# Patient Record
Sex: Female | Born: 1940 | Race: White | Hispanic: No | Marital: Married | Smoking: Former smoker
Health system: Southern US, Community
[De-identification: ages and names within clinical notes are randomized; demographics above are authoritative.]

## PROBLEM LIST (undated history)

## (undated) DIAGNOSIS — I480 Paroxysmal atrial fibrillation: Secondary | ICD-10-CM

## (undated) DIAGNOSIS — R0600 Dyspnea, unspecified: Secondary | ICD-10-CM

## (undated) DIAGNOSIS — I341 Nonrheumatic mitral (valve) prolapse: Secondary | ICD-10-CM

## (undated) DIAGNOSIS — I10 Essential (primary) hypertension: Secondary | ICD-10-CM

## (undated) DIAGNOSIS — C3490 Malignant neoplasm of unspecified part of unspecified bronchus or lung: Secondary | ICD-10-CM

## (undated) DIAGNOSIS — I119 Hypertensive heart disease without heart failure: Secondary | ICD-10-CM

## (undated) DIAGNOSIS — D649 Anemia, unspecified: Secondary | ICD-10-CM

## (undated) DIAGNOSIS — Z9289 Personal history of other medical treatment: Secondary | ICD-10-CM

## (undated) DIAGNOSIS — I5032 Chronic diastolic (congestive) heart failure: Secondary | ICD-10-CM

## (undated) DIAGNOSIS — K219 Gastro-esophageal reflux disease without esophagitis: Secondary | ICD-10-CM

## (undated) DIAGNOSIS — Z9981 Dependence on supplemental oxygen: Secondary | ICD-10-CM

## (undated) DIAGNOSIS — I700 Atherosclerosis of aorta: Secondary | ICD-10-CM

## (undated) DIAGNOSIS — I4819 Other persistent atrial fibrillation: Secondary | ICD-10-CM

## (undated) DIAGNOSIS — C07 Malignant neoplasm of parotid gland: Secondary | ICD-10-CM

## (undated) DIAGNOSIS — I509 Heart failure, unspecified: Secondary | ICD-10-CM

## (undated) DIAGNOSIS — I2510 Atherosclerotic heart disease of native coronary artery without angina pectoris: Secondary | ICD-10-CM

## (undated) DIAGNOSIS — N183 Chronic kidney disease, stage 3 (moderate): Secondary | ICD-10-CM

## (undated) DIAGNOSIS — R011 Cardiac murmur, unspecified: Secondary | ICD-10-CM

## (undated) DIAGNOSIS — J984 Other disorders of lung: Secondary | ICD-10-CM

## (undated) HISTORY — PX: DERMABRASION: SHX610

## (undated) HISTORY — PX: PORTACATH PLACEMENT: SHX2246

## (undated) HISTORY — PX: THORACENTESIS: SHX235

## (undated) HISTORY — PX: PAROTID GLAND TUMOR EXCISION: SHX5221

## (undated) HISTORY — DX: Essential (primary) hypertension: I10

## (undated) HISTORY — PX: CATARACT EXTRACTION W/ INTRAOCULAR LENS  IMPLANT, BILATERAL: SHX1307

## (undated) HISTORY — PX: TONSILLECTOMY: SUR1361

## (undated) HISTORY — DX: Paroxysmal atrial fibrillation: I48.0

---

## 1998-02-05 ENCOUNTER — Ambulatory Visit (HOSPITAL_COMMUNITY): Admission: RE | Admit: 1998-02-05 | Discharge: 1998-02-05 | Payer: Self-pay | Admitting: Obstetrics and Gynecology

## 1998-02-12 ENCOUNTER — Ambulatory Visit (HOSPITAL_COMMUNITY): Admission: RE | Admit: 1998-02-12 | Discharge: 1998-02-12 | Payer: Self-pay | Admitting: Obstetrics and Gynecology

## 1999-01-27 ENCOUNTER — Ambulatory Visit (HOSPITAL_COMMUNITY): Admission: RE | Admit: 1999-01-27 | Discharge: 1999-01-27 | Payer: Self-pay

## 1999-02-02 ENCOUNTER — Other Ambulatory Visit: Admission: RE | Admit: 1999-02-02 | Discharge: 1999-02-02 | Payer: Self-pay | Admitting: Obstetrics & Gynecology

## 1999-02-18 ENCOUNTER — Ambulatory Visit (HOSPITAL_COMMUNITY): Admission: RE | Admit: 1999-02-18 | Discharge: 1999-02-18 | Payer: Self-pay

## 1999-03-03 ENCOUNTER — Ambulatory Visit (HOSPITAL_COMMUNITY): Admission: RE | Admit: 1999-03-03 | Discharge: 1999-03-03 | Payer: Self-pay | Admitting: Obstetrics & Gynecology

## 1999-03-03 ENCOUNTER — Encounter: Payer: Self-pay | Admitting: Obstetrics & Gynecology

## 1999-03-23 ENCOUNTER — Ambulatory Visit (HOSPITAL_COMMUNITY): Admission: RE | Admit: 1999-03-23 | Discharge: 1999-03-23 | Payer: Self-pay | Admitting: Obstetrics & Gynecology

## 2000-02-24 ENCOUNTER — Ambulatory Visit (HOSPITAL_COMMUNITY): Admission: RE | Admit: 2000-02-24 | Discharge: 2000-02-24 | Payer: Self-pay | Admitting: Obstetrics & Gynecology

## 2001-01-22 ENCOUNTER — Other Ambulatory Visit: Admission: RE | Admit: 2001-01-22 | Discharge: 2001-01-22 | Payer: Self-pay | Admitting: Obstetrics & Gynecology

## 2001-03-08 ENCOUNTER — Ambulatory Visit (HOSPITAL_COMMUNITY): Admission: RE | Admit: 2001-03-08 | Discharge: 2001-03-08 | Payer: Self-pay | Admitting: Obstetrics & Gynecology

## 2002-03-21 ENCOUNTER — Ambulatory Visit (HOSPITAL_COMMUNITY): Admission: RE | Admit: 2002-03-21 | Discharge: 2002-03-21 | Payer: Self-pay | Admitting: Obstetrics & Gynecology

## 2003-03-27 ENCOUNTER — Ambulatory Visit (HOSPITAL_COMMUNITY): Admission: RE | Admit: 2003-03-27 | Discharge: 2003-03-27 | Payer: Self-pay | Admitting: Obstetrics & Gynecology

## 2003-03-27 ENCOUNTER — Encounter: Payer: Self-pay | Admitting: Obstetrics & Gynecology

## 2003-03-27 ENCOUNTER — Encounter (INDEPENDENT_AMBULATORY_CARE_PROVIDER_SITE_OTHER): Payer: Self-pay | Admitting: *Deleted

## 2005-07-28 ENCOUNTER — Ambulatory Visit (HOSPITAL_COMMUNITY): Admission: RE | Admit: 2005-07-28 | Discharge: 2005-07-28 | Payer: Self-pay | Admitting: Family Medicine

## 2005-08-08 ENCOUNTER — Other Ambulatory Visit: Admission: RE | Admit: 2005-08-08 | Discharge: 2005-08-08 | Payer: Self-pay | Admitting: Obstetrics & Gynecology

## 2006-07-31 ENCOUNTER — Ambulatory Visit (HOSPITAL_COMMUNITY): Admission: RE | Admit: 2006-07-31 | Discharge: 2006-07-31 | Payer: Self-pay | Admitting: Obstetrics & Gynecology

## 2007-02-05 ENCOUNTER — Encounter: Admission: RE | Admit: 2007-02-05 | Discharge: 2007-02-05 | Payer: Self-pay | Admitting: Family Medicine

## 2007-02-06 ENCOUNTER — Ambulatory Visit: Payer: Self-pay | Admitting: Thoracic Surgery

## 2007-02-18 ENCOUNTER — Ambulatory Visit (HOSPITAL_COMMUNITY): Admission: RE | Admit: 2007-02-18 | Discharge: 2007-02-18 | Payer: Self-pay | Admitting: Thoracic Surgery

## 2007-02-19 ENCOUNTER — Ambulatory Visit (HOSPITAL_COMMUNITY): Admission: RE | Admit: 2007-02-19 | Discharge: 2007-02-19 | Payer: Self-pay | Admitting: Thoracic Surgery

## 2007-02-19 ENCOUNTER — Ambulatory Visit: Payer: Self-pay | Admitting: Thoracic Surgery

## 2007-02-19 ENCOUNTER — Encounter (INDEPENDENT_AMBULATORY_CARE_PROVIDER_SITE_OTHER): Payer: Self-pay | Admitting: *Deleted

## 2007-02-20 ENCOUNTER — Ambulatory Visit: Payer: Self-pay | Admitting: Thoracic Surgery

## 2007-02-20 ENCOUNTER — Ambulatory Visit: Payer: Self-pay | Admitting: Internal Medicine

## 2007-03-04 ENCOUNTER — Ambulatory Visit: Admission: RE | Admit: 2007-03-04 | Discharge: 2007-05-11 | Payer: Self-pay | Admitting: Radiation Oncology

## 2007-03-04 LAB — CBC WITH DIFFERENTIAL/PLATELET
Eosinophils Absolute: 0.3 10*3/uL (ref 0.0–0.5)
HCT: 41.5 % (ref 34.8–46.6)
LYMPH%: 19.2 % (ref 14.0–48.0)
MONO#: 0.8 10*3/uL (ref 0.1–0.9)
NEUT#: 4.8 10*3/uL (ref 1.5–6.5)
NEUT%: 65.5 % (ref 39.6–76.8)
Platelets: 293 10*3/uL (ref 145–400)
WBC: 7.3 10*3/uL (ref 3.9–10.0)

## 2007-03-04 LAB — COMPREHENSIVE METABOLIC PANEL
CO2: 29 mEq/L (ref 19–32)
Creatinine, Ser: 0.83 mg/dL (ref 0.40–1.20)
Glucose, Bld: 88 mg/dL (ref 70–99)
Total Bilirubin: 0.7 mg/dL (ref 0.3–1.2)
Total Protein: 6.8 g/dL (ref 6.0–8.3)

## 2007-03-11 ENCOUNTER — Ambulatory Visit (HOSPITAL_COMMUNITY): Admission: RE | Admit: 2007-03-11 | Discharge: 2007-03-11 | Payer: Self-pay | Admitting: Internal Medicine

## 2007-03-12 LAB — CBC WITH DIFFERENTIAL/PLATELET
Eosinophils Absolute: 0.4 10*3/uL (ref 0.0–0.5)
HCT: 41.6 % (ref 34.8–46.6)
LYMPH%: 19.6 % (ref 14.0–48.0)
MONO#: 0.7 10*3/uL (ref 0.1–0.9)
NEUT#: 4 10*3/uL (ref 1.5–6.5)
Platelets: 263 10*3/uL (ref 145–400)
RBC: 4.74 10*6/uL (ref 3.70–5.32)
WBC: 6.3 10*3/uL (ref 3.9–10.0)
lymph#: 1.2 10*3/uL (ref 0.9–3.3)

## 2007-03-12 LAB — COMPREHENSIVE METABOLIC PANEL
Albumin: 3.8 g/dL (ref 3.5–5.2)
CO2: 25 mEq/L (ref 19–32)
Calcium: 9.4 mg/dL (ref 8.4–10.5)
Chloride: 99 mEq/L (ref 96–112)
Glucose, Bld: 120 mg/dL — ABNORMAL HIGH (ref 70–99)
Potassium: 4.1 mEq/L (ref 3.5–5.3)
Sodium: 132 mEq/L — ABNORMAL LOW (ref 135–145)
Total Bilirubin: 0.7 mg/dL (ref 0.3–1.2)
Total Protein: 6.4 g/dL (ref 6.0–8.3)

## 2007-03-13 ENCOUNTER — Ambulatory Visit: Payer: Self-pay | Admitting: Thoracic Surgery

## 2007-03-19 LAB — COMPREHENSIVE METABOLIC PANEL
BUN: 15 mg/dL (ref 6–23)
CO2: 23 mEq/L (ref 19–32)
Calcium: 9.7 mg/dL (ref 8.4–10.5)
Chloride: 104 mEq/L (ref 96–112)
Creatinine, Ser: 0.75 mg/dL (ref 0.40–1.20)
Glucose, Bld: 89 mg/dL (ref 70–99)

## 2007-03-19 LAB — CBC WITH DIFFERENTIAL/PLATELET
Basophils Absolute: 0 10*3/uL (ref 0.0–0.1)
HCT: 39.1 % (ref 34.8–46.6)
HGB: 13.6 g/dL (ref 11.6–15.9)
MCH: 30.5 pg (ref 26.0–34.0)
MONO#: 0.5 10*3/uL (ref 0.1–0.9)
NEUT%: 66.6 % (ref 39.6–76.8)
WBC: 5.8 10*3/uL (ref 3.9–10.0)
lymph#: 1 10*3/uL (ref 0.9–3.3)

## 2007-03-26 LAB — CBC WITH DIFFERENTIAL/PLATELET
EOS%: 3.2 % (ref 0.0–7.0)
Eosinophils Absolute: 0.1 10*3/uL (ref 0.0–0.5)
HCT: 37.8 % (ref 34.8–46.6)
HGB: 13.5 g/dL (ref 11.6–15.9)
LYMPH%: 12.7 % — ABNORMAL LOW (ref 14.0–48.0)
MONO%: 11.5 % (ref 0.0–13.0)
RBC: 4.35 10*6/uL (ref 3.70–5.32)
RDW: 11.1 % — ABNORMAL LOW (ref 11.3–14.5)
WBC: 4.1 10*3/uL (ref 3.9–10.0)

## 2007-03-26 LAB — COMPREHENSIVE METABOLIC PANEL
AST: 31 U/L (ref 0–37)
Alkaline Phosphatase: 60 U/L (ref 39–117)
Glucose, Bld: 88 mg/dL (ref 70–99)
Sodium: 133 mEq/L — ABNORMAL LOW (ref 135–145)
Total Bilirubin: 0.7 mg/dL (ref 0.3–1.2)
Total Protein: 6.8 g/dL (ref 6.0–8.3)

## 2007-04-01 LAB — COMPREHENSIVE METABOLIC PANEL
ALT: 32 U/L (ref 0–35)
AST: 19 U/L (ref 0–37)
Albumin: 3.9 g/dL (ref 3.5–5.2)
CO2: 25 mEq/L (ref 19–32)
Calcium: 9.4 mg/dL (ref 8.4–10.5)
Chloride: 103 mEq/L (ref 96–112)
Potassium: 4.3 mEq/L (ref 3.5–5.3)
Sodium: 137 mEq/L (ref 135–145)
Total Protein: 6.3 g/dL (ref 6.0–8.3)

## 2007-04-01 LAB — CBC WITH DIFFERENTIAL/PLATELET
Basophils Absolute: 0 10*3/uL (ref 0.0–0.1)
Eosinophils Absolute: 0.1 10*3/uL (ref 0.0–0.5)
HGB: 12.7 g/dL (ref 11.6–15.9)
LYMPH%: 10 % — ABNORMAL LOW (ref 14.0–48.0)
MCH: 31.3 pg (ref 26.0–34.0)
MCV: 86.9 fL (ref 81.0–101.0)
MONO%: 9.4 % (ref 0.0–13.0)
NEUT#: 2.5 10*3/uL (ref 1.5–6.5)
Platelets: 194 10*3/uL (ref 145–400)

## 2007-04-04 ENCOUNTER — Ambulatory Visit: Payer: Self-pay | Admitting: Internal Medicine

## 2007-04-08 LAB — CBC WITH DIFFERENTIAL/PLATELET
BASO%: 0.9 % (ref 0.0–2.0)
EOS%: 1.1 % (ref 0.0–7.0)
HCT: 33.8 % — ABNORMAL LOW (ref 34.8–46.6)
MCH: 31.2 pg (ref 26.0–34.0)
MCHC: 35.9 g/dL (ref 32.0–36.0)
MONO#: 0.3 10*3/uL (ref 0.1–0.9)
NEUT%: 81.3 % — ABNORMAL HIGH (ref 39.6–76.8)
RBC: 3.88 10*6/uL (ref 3.70–5.32)
WBC: 2.9 10*3/uL — ABNORMAL LOW (ref 3.9–10.0)
lymph#: 0.2 10*3/uL — ABNORMAL LOW (ref 0.9–3.3)

## 2007-04-08 LAB — COMPREHENSIVE METABOLIC PANEL
ALT: 49 U/L — ABNORMAL HIGH (ref 0–35)
AST: 31 U/L (ref 0–37)
CO2: 28 mEq/L (ref 19–32)
Calcium: 9.8 mg/dL (ref 8.4–10.5)
Chloride: 104 mEq/L (ref 96–112)
Creatinine, Ser: 0.87 mg/dL (ref 0.40–1.20)
Sodium: 137 mEq/L (ref 135–145)
Total Bilirubin: 1 mg/dL (ref 0.3–1.2)
Total Protein: 6.3 g/dL (ref 6.0–8.3)

## 2007-04-16 LAB — CBC WITH DIFFERENTIAL/PLATELET
BASO%: 1.8 % (ref 0.0–2.0)
EOS%: 2.3 % (ref 0.0–7.0)
HCT: 32.1 % — ABNORMAL LOW (ref 34.8–46.6)
LYMPH%: 14.2 % (ref 14.0–48.0)
MCH: 30.5 pg (ref 26.0–34.0)
MCHC: 35.7 g/dL (ref 32.0–36.0)
NEUT%: 63.7 % (ref 39.6–76.8)
Platelets: 109 10*3/uL — ABNORMAL LOW (ref 145–400)
RBC: 3.76 10*6/uL (ref 3.70–5.32)

## 2007-04-17 ENCOUNTER — Other Ambulatory Visit: Admission: RE | Admit: 2007-04-17 | Discharge: 2007-04-17 | Payer: Self-pay | Admitting: Interventional Radiology

## 2007-04-17 ENCOUNTER — Encounter (INDEPENDENT_AMBULATORY_CARE_PROVIDER_SITE_OTHER): Payer: Self-pay | Admitting: Interventional Radiology

## 2007-04-17 ENCOUNTER — Encounter: Admission: RE | Admit: 2007-04-17 | Discharge: 2007-04-17 | Payer: Self-pay | Admitting: Internal Medicine

## 2007-04-23 LAB — COMPREHENSIVE METABOLIC PANEL
ALT: 25 U/L (ref 0–35)
AST: 20 U/L (ref 0–37)
Albumin: 3.6 g/dL (ref 3.5–5.2)
Calcium: 9.7 mg/dL (ref 8.4–10.5)
Chloride: 103 mEq/L (ref 96–112)
Creatinine, Ser: 0.8 mg/dL (ref 0.40–1.20)
Potassium: 3.7 mEq/L (ref 3.5–5.3)
Sodium: 139 mEq/L (ref 135–145)
Total Protein: 6 g/dL (ref 6.0–8.3)

## 2007-04-23 LAB — CBC WITH DIFFERENTIAL/PLATELET
BASO%: 0.6 % (ref 0.0–2.0)
EOS%: 1.7 % (ref 0.0–7.0)
MCH: UNDETERMINED pg (ref 26.0–34.0)
MCHC: UNDETERMINED g/dL (ref 32.0–36.0)
RBC: UNDETERMINED 10*6/uL (ref 3.70–5.32)
RDW: 14.1 % (ref 11.3–14.5)
WBC: 1.4 10*3/uL — ABNORMAL LOW (ref 3.9–10.0)
lymph#: 0.3 10*3/uL — ABNORMAL LOW (ref 0.9–3.3)

## 2007-04-30 LAB — CBC WITH DIFFERENTIAL/PLATELET
BASO%: 0.1 % (ref 0.0–2.0)
Basophils Absolute: 0 10*3/uL (ref 0.0–0.1)
EOS%: 0.5 % (ref 0.0–7.0)
MCH: 31.6 pg (ref 26.0–34.0)
MCHC: 35.5 g/dL (ref 32.0–36.0)
MCV: 89.1 fL (ref 81.0–101.0)
MONO%: 21 % — ABNORMAL HIGH (ref 0.0–13.0)
RBC: 3.49 10*6/uL — ABNORMAL LOW (ref 3.70–5.32)
RDW: 17.9 % — ABNORMAL HIGH (ref 11.3–14.5)

## 2007-05-14 ENCOUNTER — Ambulatory Visit (HOSPITAL_COMMUNITY): Admission: RE | Admit: 2007-05-14 | Discharge: 2007-05-14 | Payer: Self-pay | Admitting: Internal Medicine

## 2007-05-16 ENCOUNTER — Ambulatory Visit: Payer: Self-pay | Admitting: Internal Medicine

## 2007-05-21 LAB — CBC WITH DIFFERENTIAL/PLATELET
Eosinophils Absolute: 0.3 10*3/uL (ref 0.0–0.5)
MCV: 90 fL (ref 81.0–101.0)
MONO%: 13.4 % — ABNORMAL HIGH (ref 0.0–13.0)
NEUT#: 6.4 10*3/uL (ref 1.5–6.5)
RBC: 3.57 10*6/uL — ABNORMAL LOW (ref 3.70–5.32)
RDW: 18.1 % — ABNORMAL HIGH (ref 11.3–14.5)
WBC: 8.6 10*3/uL (ref 3.9–10.0)

## 2007-05-21 LAB — COMPREHENSIVE METABOLIC PANEL
AST: 18 U/L (ref 0–37)
Albumin: 3.3 g/dL — ABNORMAL LOW (ref 3.5–5.2)
Alkaline Phosphatase: 80 U/L (ref 39–117)
Glucose, Bld: 107 mg/dL — ABNORMAL HIGH (ref 70–99)
Potassium: 3.9 mEq/L (ref 3.5–5.3)
Sodium: 136 mEq/L (ref 135–145)
Total Protein: 6.4 g/dL (ref 6.0–8.3)

## 2007-05-28 LAB — CBC WITH DIFFERENTIAL/PLATELET
EOS%: 0.1 % (ref 0.0–7.0)
MCH: 31.5 pg (ref 26.0–34.0)
MCHC: 35.7 g/dL (ref 32.0–36.0)
MCV: 88.1 fL (ref 81.0–101.0)
MONO%: 6.8 % (ref 0.0–13.0)
NEUT#: 11.2 10*3/uL — ABNORMAL HIGH (ref 1.5–6.5)
RBC: 3.75 10*6/uL (ref 3.70–5.32)
RDW: 13.4 % (ref 11.3–14.5)

## 2007-05-28 LAB — COMPREHENSIVE METABOLIC PANEL
AST: 25 U/L (ref 0–37)
Albumin: 3.7 g/dL (ref 3.5–5.2)
Alkaline Phosphatase: 82 U/L (ref 39–117)
Potassium: 4.6 mEq/L (ref 3.5–5.3)
Sodium: 137 mEq/L (ref 135–145)
Total Protein: 6.9 g/dL (ref 6.0–8.3)

## 2007-06-04 LAB — CBC WITH DIFFERENTIAL/PLATELET
Basophils Absolute: 0 10*3/uL (ref 0.0–0.1)
EOS%: 1.8 % (ref 0.0–7.0)
HGB: 10.8 g/dL — ABNORMAL LOW (ref 11.6–15.9)
LYMPH%: 3 % — ABNORMAL LOW (ref 14.0–48.0)
MCH: 31.5 pg (ref 26.0–34.0)
MCV: 90.5 fL (ref 81.0–101.0)
MONO%: 11.2 % (ref 0.0–13.0)
Platelets: 215 10*3/uL (ref 145–400)
RBC: 3.43 10*6/uL — ABNORMAL LOW (ref 3.70–5.32)
RDW: 17.1 % — ABNORMAL HIGH (ref 11.3–14.5)

## 2007-06-04 LAB — COMPREHENSIVE METABOLIC PANEL
AST: 26 U/L (ref 0–37)
Albumin: 3.3 g/dL — ABNORMAL LOW (ref 3.5–5.2)
Alkaline Phosphatase: 89 U/L (ref 39–117)
BUN: 12 mg/dL (ref 6–23)
Potassium: 3.8 mEq/L (ref 3.5–5.3)
Total Bilirubin: 0.6 mg/dL (ref 0.3–1.2)

## 2007-06-11 LAB — CBC WITH DIFFERENTIAL/PLATELET
Basophils Absolute: 0 10*3/uL (ref 0.0–0.1)
EOS%: 0.4 % (ref 0.0–7.0)
Eosinophils Absolute: 0.1 10*3/uL (ref 0.0–0.5)
HCT: 32.2 % — ABNORMAL LOW (ref 34.8–46.6)
HGB: 11.2 g/dL — ABNORMAL LOW (ref 11.6–15.9)
LYMPH%: 2.7 % — ABNORMAL LOW (ref 14.0–48.0)
MCH: 31.2 pg (ref 26.0–34.0)
MCV: 89.3 fL (ref 81.0–101.0)
MONO%: 5.5 % (ref 0.0–13.0)
NEUT#: 15 10*3/uL — ABNORMAL HIGH (ref 1.5–6.5)
NEUT%: 91.3 % — ABNORMAL HIGH (ref 39.6–76.8)
Platelets: 278 10*3/uL (ref 145–400)
RDW: 17.1 % — ABNORMAL HIGH (ref 11.3–14.5)

## 2007-06-11 LAB — COMPREHENSIVE METABOLIC PANEL
AST: 16 U/L (ref 0–37)
Albumin: 3.5 g/dL (ref 3.5–5.2)
Alkaline Phosphatase: 99 U/L (ref 39–117)
BUN: 15 mg/dL (ref 6–23)
Creatinine, Ser: 0.94 mg/dL (ref 0.40–1.20)
Glucose, Bld: 103 mg/dL — ABNORMAL HIGH (ref 70–99)

## 2007-06-18 LAB — COMPREHENSIVE METABOLIC PANEL
AST: 18 U/L (ref 0–37)
Albumin: 3.6 g/dL (ref 3.5–5.2)
BUN: 16 mg/dL (ref 6–23)
Calcium: 9.7 mg/dL (ref 8.4–10.5)
Chloride: 102 mEq/L (ref 96–112)
Creatinine, Ser: 0.82 mg/dL (ref 0.40–1.20)
Glucose, Bld: 164 mg/dL — ABNORMAL HIGH (ref 70–99)
Potassium: 3.9 mEq/L (ref 3.5–5.3)

## 2007-06-18 LAB — CBC WITH DIFFERENTIAL/PLATELET
Basophils Absolute: 0 10*3/uL (ref 0.0–0.1)
EOS%: 0.1 % (ref 0.0–7.0)
Eosinophils Absolute: 0 10*3/uL (ref 0.0–0.5)
HCT: 33 % — ABNORMAL LOW (ref 34.8–46.6)
HGB: 11.3 g/dL — ABNORMAL LOW (ref 11.6–15.9)
MCH: 30.6 pg (ref 26.0–34.0)
MCV: 89.6 fL (ref 81.0–101.0)
MONO%: 5 % (ref 0.0–13.0)
NEUT#: 10.8 10*3/uL — ABNORMAL HIGH (ref 1.5–6.5)
NEUT%: 90.4 % — ABNORMAL HIGH (ref 39.6–76.8)
RDW: 14.3 % (ref 11.3–14.5)
lymph#: 0.5 10*3/uL — ABNORMAL LOW (ref 0.9–3.3)

## 2007-06-25 LAB — CBC WITH DIFFERENTIAL/PLATELET
Basophils Absolute: 0 10*3/uL (ref 0.0–0.1)
Eosinophils Absolute: 0.1 10*3/uL (ref 0.0–0.5)
HCT: 30.5 % — ABNORMAL LOW (ref 34.8–46.6)
HGB: 10.5 g/dL — ABNORMAL LOW (ref 11.6–15.9)
MONO#: 2.5 10*3/uL — ABNORMAL HIGH (ref 0.1–0.9)
NEUT#: 10.9 10*3/uL — ABNORMAL HIGH (ref 1.5–6.5)
NEUT%: 76.9 % — ABNORMAL HIGH (ref 39.6–76.8)
RDW: 16.8 % — ABNORMAL HIGH (ref 11.3–14.5)
lymph#: 0.7 10*3/uL — ABNORMAL LOW (ref 0.9–3.3)

## 2007-06-25 LAB — COMPREHENSIVE METABOLIC PANEL
AST: 16 U/L (ref 0–37)
Albumin: 3.3 g/dL — ABNORMAL LOW (ref 3.5–5.2)
BUN: 14 mg/dL (ref 6–23)
CO2: 27 mEq/L (ref 19–32)
Calcium: 9.6 mg/dL (ref 8.4–10.5)
Chloride: 99 mEq/L (ref 96–112)
Creatinine, Ser: 0.81 mg/dL (ref 0.40–1.20)
Glucose, Bld: 107 mg/dL — ABNORMAL HIGH (ref 70–99)
Potassium: 3.9 mEq/L (ref 3.5–5.3)

## 2007-06-28 ENCOUNTER — Ambulatory Visit: Payer: Self-pay | Admitting: Internal Medicine

## 2007-07-02 LAB — CBC WITH DIFFERENTIAL/PLATELET
BASO%: 0.9 % (ref 0.0–2.0)
Basophils Absolute: 0.1 10*3/uL (ref 0.0–0.1)
EOS%: 0.3 % (ref 0.0–7.0)
HGB: 11.2 g/dL — ABNORMAL LOW (ref 11.6–15.9)
MCH: 31.3 pg (ref 26.0–34.0)
MONO#: 0.7 10*3/uL (ref 0.1–0.9)
NEUT#: 8.7 10*3/uL — ABNORMAL HIGH (ref 1.5–6.5)
RDW: 17.4 % — ABNORMAL HIGH (ref 11.3–14.5)
WBC: 10.1 10*3/uL — ABNORMAL HIGH (ref 3.9–10.0)
lymph#: 0.5 10*3/uL — ABNORMAL LOW (ref 0.9–3.3)

## 2007-07-02 LAB — COMPREHENSIVE METABOLIC PANEL
ALT: 27 U/L (ref 0–35)
AST: 25 U/L (ref 0–37)
Albumin: 3.5 g/dL (ref 3.5–5.2)
BUN: 12 mg/dL (ref 6–23)
CO2: 25 mEq/L (ref 19–32)
Calcium: 9.5 mg/dL (ref 8.4–10.5)
Chloride: 103 mEq/L (ref 96–112)
Potassium: 4.2 mEq/L (ref 3.5–5.3)

## 2007-07-09 LAB — CBC WITH DIFFERENTIAL/PLATELET
BASO%: 0.2 % (ref 0.0–2.0)
EOS%: 0 % (ref 0.0–7.0)
MCH: 31.2 pg (ref 26.0–34.0)
MCHC: 34.4 g/dL (ref 32.0–36.0)
RDW: 15.1 % — ABNORMAL HIGH (ref 11.3–14.5)
lymph#: 0.4 10*3/uL — ABNORMAL LOW (ref 0.9–3.3)

## 2007-07-09 LAB — COMPREHENSIVE METABOLIC PANEL
ALT: 27 U/L (ref 0–35)
AST: 25 U/L (ref 0–37)
Albumin: 3.9 g/dL (ref 3.5–5.2)
Calcium: 10.1 mg/dL (ref 8.4–10.5)
Chloride: 104 mEq/L (ref 96–112)
Potassium: 4.7 mEq/L (ref 3.5–5.3)
Sodium: 137 mEq/L (ref 135–145)

## 2007-07-16 LAB — CBC WITH DIFFERENTIAL/PLATELET
BASO%: 0.1 % (ref 0.0–2.0)
EOS%: 0.1 % (ref 0.0–7.0)
HCT: 32.4 % — ABNORMAL LOW (ref 34.8–46.6)
MCH: 31.7 pg (ref 26.0–34.0)
MCHC: 35.2 g/dL (ref 32.0–36.0)
NEUT%: 80.5 % — ABNORMAL HIGH (ref 39.6–76.8)
RDW: 18.3 % — ABNORMAL HIGH (ref 11.3–14.5)
lymph#: 0.5 10*3/uL — ABNORMAL LOW (ref 0.9–3.3)

## 2007-07-16 LAB — COMPREHENSIVE METABOLIC PANEL
ALT: 39 U/L — ABNORMAL HIGH (ref 0–35)
AST: 21 U/L (ref 0–37)
Alkaline Phosphatase: 86 U/L (ref 39–117)
Calcium: 9.9 mg/dL (ref 8.4–10.5)
Chloride: 101 mEq/L (ref 96–112)
Creatinine, Ser: 0.85 mg/dL (ref 0.40–1.20)

## 2007-07-22 ENCOUNTER — Other Ambulatory Visit: Payer: Self-pay | Admitting: Internal Medicine

## 2007-07-23 ENCOUNTER — Ambulatory Visit (HOSPITAL_COMMUNITY): Admission: RE | Admit: 2007-07-23 | Discharge: 2007-07-23 | Payer: Self-pay | Admitting: Internal Medicine

## 2007-07-23 LAB — CBC WITH DIFFERENTIAL/PLATELET
BASO%: 1 % (ref 0.0–2.0)
EOS%: 0.2 % (ref 0.0–7.0)
HCT: 34.8 % (ref 34.8–46.6)
LYMPH%: 4.2 % — ABNORMAL LOW (ref 14.0–48.0)
MCH: 31.5 pg (ref 26.0–34.0)
MCHC: 34.7 g/dL (ref 32.0–36.0)
MCV: 90.7 fL (ref 81.0–101.0)
MONO%: 6.5 % (ref 0.0–13.0)
NEUT%: 88.1 % — ABNORMAL HIGH (ref 39.6–76.8)
Platelets: 266 10*3/uL (ref 145–400)

## 2007-07-23 LAB — COMPREHENSIVE METABOLIC PANEL
ALT: 23 U/L (ref 0–35)
AST: 18 U/L (ref 0–37)
Creatinine, Ser: 0.74 mg/dL (ref 0.40–1.20)
Total Bilirubin: 0.8 mg/dL (ref 0.3–1.2)

## 2007-07-30 LAB — CBC WITH DIFFERENTIAL/PLATELET
BASO%: 0.1 % (ref 0.0–2.0)
Basophils Absolute: 0 10*3/uL (ref 0.0–0.1)
EOS%: 0 % (ref 0.0–7.0)
HCT: 34.5 % — ABNORMAL LOW (ref 34.8–46.6)
HGB: 12.1 g/dL (ref 11.6–15.9)
LYMPH%: 1.7 % — ABNORMAL LOW (ref 14.0–48.0)
MCH: 31.2 pg (ref 26.0–34.0)
MCHC: 35.1 g/dL (ref 32.0–36.0)
MONO#: 0.4 10*3/uL (ref 0.1–0.9)
NEUT%: 95.2 % — ABNORMAL HIGH (ref 39.6–76.8)
Platelets: 297 10*3/uL (ref 145–400)

## 2007-07-30 LAB — COMPREHENSIVE METABOLIC PANEL
ALT: 18 U/L (ref 0–35)
BUN: 20 mg/dL (ref 6–23)
CO2: 20 mEq/L (ref 19–32)
Calcium: 10.3 mg/dL (ref 8.4–10.5)
Creatinine, Ser: 0.69 mg/dL (ref 0.40–1.20)
Total Bilirubin: 0.7 mg/dL (ref 0.3–1.2)

## 2007-08-08 ENCOUNTER — Ambulatory Visit (HOSPITAL_COMMUNITY): Admission: RE | Admit: 2007-08-08 | Discharge: 2007-08-08 | Payer: Self-pay | Admitting: Obstetrics & Gynecology

## 2007-10-23 ENCOUNTER — Ambulatory Visit: Payer: Self-pay | Admitting: Internal Medicine

## 2007-10-29 LAB — CBC WITH DIFFERENTIAL/PLATELET
Basophils Absolute: 0 10*3/uL (ref 0.0–0.1)
Eosinophils Absolute: 0.1 10*3/uL (ref 0.0–0.5)
HCT: 38 % (ref 34.8–46.6)
HGB: 13.1 g/dL (ref 11.6–15.9)
LYMPH%: 15.1 % (ref 14.0–48.0)
MCHC: 34.5 g/dL (ref 32.0–36.0)
MONO#: 0.7 10*3/uL (ref 0.1–0.9)
NEUT%: 69.7 % (ref 39.6–76.8)
Platelets: 258 10*3/uL (ref 145–400)
WBC: 4.9 10*3/uL (ref 3.9–10.0)
lymph#: 0.7 10*3/uL — ABNORMAL LOW (ref 0.9–3.3)

## 2007-10-29 LAB — COMPREHENSIVE METABOLIC PANEL
ALT: 22 U/L (ref 0–35)
BUN: 15 mg/dL (ref 6–23)
CO2: 28 mEq/L (ref 19–32)
Calcium: 10 mg/dL (ref 8.4–10.5)
Chloride: 103 mEq/L (ref 96–112)
Creatinine, Ser: 0.77 mg/dL (ref 0.40–1.20)
Glucose, Bld: 94 mg/dL (ref 70–99)
Total Bilirubin: 0.6 mg/dL (ref 0.3–1.2)

## 2007-10-31 ENCOUNTER — Ambulatory Visit (HOSPITAL_COMMUNITY): Admission: RE | Admit: 2007-10-31 | Discharge: 2007-10-31 | Payer: Self-pay | Admitting: Internal Medicine

## 2007-11-05 ENCOUNTER — Ambulatory Visit (HOSPITAL_COMMUNITY): Admission: RE | Admit: 2007-11-05 | Discharge: 2007-11-05 | Payer: Self-pay | Admitting: Internal Medicine

## 2007-11-05 ENCOUNTER — Encounter (INDEPENDENT_AMBULATORY_CARE_PROVIDER_SITE_OTHER): Payer: Self-pay | Admitting: Interventional Radiology

## 2007-11-19 ENCOUNTER — Ambulatory Visit (HOSPITAL_COMMUNITY): Admission: RE | Admit: 2007-11-19 | Discharge: 2007-11-19 | Payer: Self-pay | Admitting: Internal Medicine

## 2007-11-26 ENCOUNTER — Ambulatory Visit: Payer: Self-pay | Admitting: Thoracic Surgery

## 2007-11-26 LAB — COMPREHENSIVE METABOLIC PANEL
ALT: 23 U/L (ref 0–35)
Albumin: 4.1 g/dL (ref 3.5–5.2)
Alkaline Phosphatase: 92 U/L (ref 39–117)
Potassium: 4.4 mEq/L (ref 3.5–5.3)
Sodium: 138 mEq/L (ref 135–145)
Total Bilirubin: 0.8 mg/dL (ref 0.3–1.2)
Total Protein: 6.9 g/dL (ref 6.0–8.3)

## 2007-11-26 LAB — CBC WITH DIFFERENTIAL/PLATELET
BASO%: 0.2 % (ref 0.0–2.0)
Eosinophils Absolute: 0 10*3/uL (ref 0.0–0.5)
LYMPH%: 5.2 % — ABNORMAL LOW (ref 14.0–48.0)
MCHC: 34.8 g/dL (ref 32.0–36.0)
MCV: 89 fL (ref 81.0–101.0)
MONO#: 1.1 10*3/uL — ABNORMAL HIGH (ref 0.1–0.9)
MONO%: 8.9 % (ref 0.0–13.0)
NEUT#: 10.4 10*3/uL — ABNORMAL HIGH (ref 1.5–6.5)
Platelets: 242 10*3/uL (ref 145–400)
RBC: 4.58 10*6/uL (ref 3.70–5.32)
RDW: 12.9 % (ref 11.3–14.5)
WBC: 12.1 10*3/uL — ABNORMAL HIGH (ref 3.9–10.0)

## 2007-12-02 ENCOUNTER — Encounter: Payer: Self-pay | Admitting: Thoracic Surgery

## 2007-12-02 ENCOUNTER — Ambulatory Visit (HOSPITAL_COMMUNITY): Admission: RE | Admit: 2007-12-02 | Discharge: 2007-12-02 | Payer: Self-pay | Admitting: Thoracic Surgery

## 2007-12-03 ENCOUNTER — Ambulatory Visit: Payer: Self-pay | Admitting: Thoracic Surgery

## 2007-12-10 ENCOUNTER — Ambulatory Visit: Payer: Self-pay | Admitting: Thoracic Surgery

## 2007-12-10 ENCOUNTER — Encounter: Admission: RE | Admit: 2007-12-10 | Discharge: 2007-12-10 | Payer: Self-pay | Admitting: Thoracic Surgery

## 2007-12-13 ENCOUNTER — Ambulatory Visit: Payer: Self-pay | Admitting: Internal Medicine

## 2007-12-17 LAB — COMPREHENSIVE METABOLIC PANEL
AST: 28 U/L (ref 0–37)
Alkaline Phosphatase: 100 U/L (ref 39–117)
Glucose, Bld: 100 mg/dL — ABNORMAL HIGH (ref 70–99)
Sodium: 137 mEq/L (ref 135–145)
Total Bilirubin: 0.4 mg/dL (ref 0.3–1.2)
Total Protein: 6.3 g/dL (ref 6.0–8.3)

## 2007-12-17 LAB — CBC WITH DIFFERENTIAL/PLATELET
BASO%: 0.3 % (ref 0.0–2.0)
EOS%: 0.1 % (ref 0.0–7.0)
LYMPH%: 5.3 % — ABNORMAL LOW (ref 14.0–48.0)
MCH: 31.1 pg (ref 26.0–34.0)
MCHC: 35.2 g/dL (ref 32.0–36.0)
MCV: 88.5 fL (ref 81.0–101.0)
MONO%: 10.4 % (ref 0.0–13.0)
Platelets: 410 10*3/uL — ABNORMAL HIGH (ref 145–400)
RBC: 4.23 10*6/uL (ref 3.70–5.32)
RDW: 12.1 % (ref 11.3–14.5)

## 2007-12-25 ENCOUNTER — Encounter: Admission: RE | Admit: 2007-12-25 | Discharge: 2007-12-25 | Payer: Self-pay | Admitting: Thoracic Surgery

## 2007-12-25 ENCOUNTER — Ambulatory Visit: Payer: Self-pay | Admitting: Thoracic Surgery

## 2007-12-27 ENCOUNTER — Ambulatory Visit (HOSPITAL_COMMUNITY): Admission: RE | Admit: 2007-12-27 | Discharge: 2007-12-27 | Payer: Self-pay | Admitting: Thoracic Surgery

## 2008-01-07 LAB — CBC WITH DIFFERENTIAL/PLATELET
BASO%: 1.8 % (ref 0.0–2.0)
Eosinophils Absolute: 0.2 10*3/uL (ref 0.0–0.5)
HGB: 12.9 g/dL (ref 11.6–15.9)
LYMPH%: 11.2 % — ABNORMAL LOW (ref 14.0–48.0)
MCH: 31.7 pg (ref 26.0–34.0)
MONO#: 1 10*3/uL — ABNORMAL HIGH (ref 0.1–0.9)
NEUT#: 3.7 10*3/uL (ref 1.5–6.5)
NEUT%: 65.6 % (ref 39.6–76.8)
RBC: 4.07 10*6/uL (ref 3.70–5.32)
RDW: 12.9 % (ref 11.3–14.5)
lymph#: 0.6 10*3/uL — ABNORMAL LOW (ref 0.9–3.3)

## 2008-01-07 LAB — COMPREHENSIVE METABOLIC PANEL
CO2: 25 mEq/L (ref 19–32)
Creatinine, Ser: 0.7 mg/dL (ref 0.40–1.20)
Glucose, Bld: 92 mg/dL (ref 70–99)
Total Bilirubin: 0.5 mg/dL (ref 0.3–1.2)

## 2008-01-08 ENCOUNTER — Ambulatory Visit: Payer: Self-pay | Admitting: Thoracic Surgery

## 2008-01-08 ENCOUNTER — Encounter: Admission: RE | Admit: 2008-01-08 | Discharge: 2008-01-08 | Payer: Self-pay | Admitting: Thoracic Surgery

## 2008-01-22 ENCOUNTER — Encounter: Admission: RE | Admit: 2008-01-22 | Discharge: 2008-01-22 | Payer: Self-pay | Admitting: Thoracic Surgery

## 2008-01-22 ENCOUNTER — Ambulatory Visit: Payer: Self-pay | Admitting: Thoracic Surgery

## 2008-01-23 ENCOUNTER — Ambulatory Visit: Payer: Self-pay | Admitting: Internal Medicine

## 2008-01-23 ENCOUNTER — Ambulatory Visit (HOSPITAL_COMMUNITY): Admission: RE | Admit: 2008-01-23 | Discharge: 2008-01-23 | Payer: Self-pay | Admitting: Internal Medicine

## 2008-01-28 LAB — CBC WITH DIFFERENTIAL/PLATELET
Basophils Absolute: 0.1 10*3/uL (ref 0.0–0.1)
Eosinophils Absolute: 0.1 10*3/uL (ref 0.0–0.5)
HCT: 36.4 % (ref 34.8–46.6)
HGB: 13.1 g/dL (ref 11.6–15.9)
LYMPH%: 12.5 % — ABNORMAL LOW (ref 14.0–48.0)
MONO#: 0.9 10*3/uL (ref 0.1–0.9)
NEUT#: 3.4 10*3/uL (ref 1.5–6.5)
NEUT%: 66.7 % (ref 39.6–76.8)
Platelets: 327 10*3/uL (ref 145–400)
WBC: 5.2 10*3/uL (ref 3.9–10.0)

## 2008-01-28 LAB — COMPREHENSIVE METABOLIC PANEL
CO2: 29 mEq/L (ref 19–32)
Calcium: 9.7 mg/dL (ref 8.4–10.5)
Creatinine, Ser: 0.68 mg/dL (ref 0.40–1.20)
Glucose, Bld: 93 mg/dL (ref 70–99)
Total Bilirubin: 0.6 mg/dL (ref 0.3–1.2)

## 2008-01-29 ENCOUNTER — Ambulatory Visit: Payer: Self-pay | Admitting: Thoracic Surgery

## 2008-02-03 ENCOUNTER — Ambulatory Visit (HOSPITAL_COMMUNITY): Admission: RE | Admit: 2008-02-03 | Discharge: 2008-02-03 | Payer: Self-pay | Admitting: Thoracic Surgery

## 2008-02-03 ENCOUNTER — Ambulatory Visit: Payer: Self-pay | Admitting: Thoracic Surgery

## 2008-02-12 ENCOUNTER — Encounter: Admission: RE | Admit: 2008-02-12 | Discharge: 2008-02-12 | Payer: Self-pay | Admitting: Thoracic Surgery

## 2008-02-12 ENCOUNTER — Ambulatory Visit: Payer: Self-pay | Admitting: Thoracic Surgery

## 2008-03-04 ENCOUNTER — Encounter: Admission: RE | Admit: 2008-03-04 | Discharge: 2008-03-04 | Payer: Self-pay | Admitting: Thoracic Surgery

## 2008-03-04 ENCOUNTER — Ambulatory Visit: Payer: Self-pay | Admitting: Thoracic Surgery

## 2008-03-05 ENCOUNTER — Ambulatory Visit: Payer: Self-pay | Admitting: Internal Medicine

## 2008-03-10 LAB — CBC WITH DIFFERENTIAL/PLATELET
BASO%: 0.3 % (ref 0.0–2.0)
HCT: 34.4 % — ABNORMAL LOW (ref 34.8–46.6)
LYMPH%: 4.7 % — ABNORMAL LOW (ref 14.0–48.0)
MCH: 31.3 pg (ref 26.0–34.0)
MCHC: 34.4 g/dL (ref 32.0–36.0)
MONO#: 1.2 10*3/uL — ABNORMAL HIGH (ref 0.1–0.9)
NEUT%: 84.1 % — ABNORMAL HIGH (ref 39.6–76.8)
Platelets: 422 10*3/uL — ABNORMAL HIGH (ref 145–400)
WBC: 11.2 10*3/uL — ABNORMAL HIGH (ref 3.9–10.0)

## 2008-03-10 LAB — COMPREHENSIVE METABOLIC PANEL
ALT: 27 U/L (ref 0–35)
CO2: 23 mEq/L (ref 19–32)
Creatinine, Ser: 0.74 mg/dL (ref 0.40–1.20)
Total Bilirubin: 0.4 mg/dL (ref 0.3–1.2)

## 2008-03-27 ENCOUNTER — Ambulatory Visit (HOSPITAL_COMMUNITY): Admission: RE | Admit: 2008-03-27 | Discharge: 2008-03-27 | Payer: Self-pay | Admitting: Internal Medicine

## 2008-04-15 ENCOUNTER — Ambulatory Visit: Payer: Self-pay | Admitting: Internal Medicine

## 2008-05-12 LAB — COMPREHENSIVE METABOLIC PANEL
ALT: 23 U/L (ref 0–35)
AST: 19 U/L (ref 0–37)
Alkaline Phosphatase: 94 U/L (ref 39–117)
Creatinine, Ser: 0.71 mg/dL (ref 0.40–1.20)
Total Bilirubin: 0.6 mg/dL (ref 0.3–1.2)

## 2008-05-12 LAB — CBC WITH DIFFERENTIAL/PLATELET
Eosinophils Absolute: 0 10*3/uL (ref 0.0–0.5)
HCT: 36.1 % (ref 34.8–46.6)
HGB: 12.2 g/dL (ref 11.6–15.9)
MCH: 30.8 pg (ref 26.0–34.0)
MCHC: 33.8 g/dL (ref 32.0–36.0)
MCV: 91.2 fL (ref 81.0–101.0)
MONO%: 10.4 % (ref 0.0–13.0)
Platelets: 417 10*3/uL — ABNORMAL HIGH (ref 145–400)

## 2008-05-27 ENCOUNTER — Ambulatory Visit: Payer: Self-pay | Admitting: Internal Medicine

## 2008-05-28 ENCOUNTER — Ambulatory Visit (HOSPITAL_COMMUNITY): Admission: RE | Admit: 2008-05-28 | Discharge: 2008-05-28 | Payer: Self-pay | Admitting: Internal Medicine

## 2008-06-01 LAB — CBC WITH DIFFERENTIAL/PLATELET
BASO%: 0.4 % (ref 0.0–2.0)
HCT: 34.6 % — ABNORMAL LOW (ref 34.8–46.6)
LYMPH%: 10.2 % — ABNORMAL LOW (ref 14.0–48.0)
MCH: 31.2 pg (ref 26.0–34.0)
MCHC: 34.6 g/dL (ref 32.0–36.0)
MONO#: 0.9 10*3/uL (ref 0.1–0.9)
NEUT%: 68.7 % (ref 39.6–76.8)
Platelets: 391 10*3/uL (ref 145–400)

## 2008-06-01 LAB — COMPREHENSIVE METABOLIC PANEL
ALT: 24 U/L (ref 0–35)
BUN: 13 mg/dL (ref 6–23)
CO2: 26 mEq/L (ref 19–32)
Creatinine, Ser: 0.87 mg/dL (ref 0.40–1.20)
Total Bilirubin: 0.6 mg/dL (ref 0.3–1.2)

## 2008-06-22 LAB — CBC WITH DIFFERENTIAL/PLATELET
Basophils Absolute: 0 10*3/uL (ref 0.0–0.1)
Eosinophils Absolute: 0 10*3/uL (ref 0.0–0.5)
HCT: 35.5 % (ref 34.8–46.6)
HGB: 12.1 g/dL (ref 11.6–15.9)
LYMPH%: 7.2 % — ABNORMAL LOW (ref 14.0–48.0)
MCHC: 34.1 g/dL (ref 32.0–36.0)
MONO#: 1 10*3/uL — ABNORMAL HIGH (ref 0.1–0.9)
NEUT#: 8.6 10*3/uL — ABNORMAL HIGH (ref 1.5–6.5)
NEUT%: 82.6 % — ABNORMAL HIGH (ref 39.6–76.8)
Platelets: 389 10*3/uL (ref 145–400)
WBC: 10.4 10*3/uL — ABNORMAL HIGH (ref 3.9–10.0)

## 2008-07-12 ENCOUNTER — Ambulatory Visit: Payer: Self-pay | Admitting: Internal Medicine

## 2008-07-14 LAB — COMPREHENSIVE METABOLIC PANEL
ALT: 22 U/L (ref 0–35)
AST: 21 U/L (ref 0–37)
Creatinine, Ser: 0.77 mg/dL (ref 0.40–1.20)
Total Bilirubin: 0.6 mg/dL (ref 0.3–1.2)

## 2008-07-14 LAB — CBC WITH DIFFERENTIAL/PLATELET
BASO%: 0.2 % (ref 0.0–2.0)
EOS%: 0.1 % (ref 0.0–7.0)
HCT: 38.8 % (ref 34.8–46.6)
LYMPH%: 6.4 % — ABNORMAL LOW (ref 14.0–48.0)
MCH: 30.3 pg (ref 26.0–34.0)
MCHC: 33.6 g/dL (ref 32.0–36.0)
NEUT%: 81.7 % — ABNORMAL HIGH (ref 39.6–76.8)
Platelets: 373 10*3/uL (ref 145–400)

## 2008-07-30 ENCOUNTER — Ambulatory Visit (HOSPITAL_COMMUNITY): Admission: RE | Admit: 2008-07-30 | Discharge: 2008-07-30 | Payer: Self-pay | Admitting: Internal Medicine

## 2008-08-04 LAB — COMPREHENSIVE METABOLIC PANEL
ALT: 24 U/L (ref 0–35)
BUN: 17 mg/dL (ref 6–23)
CO2: 22 mEq/L (ref 19–32)
Calcium: 10.5 mg/dL (ref 8.4–10.5)
Chloride: 100 mEq/L (ref 96–112)
Creatinine, Ser: 0.78 mg/dL (ref 0.40–1.20)
Glucose, Bld: 105 mg/dL — ABNORMAL HIGH (ref 70–99)

## 2008-08-04 LAB — CBC WITH DIFFERENTIAL/PLATELET
BASO%: 0.2 % (ref 0.0–2.0)
Basophils Absolute: 0 10*3/uL (ref 0.0–0.1)
Eosinophils Absolute: 0 10*3/uL (ref 0.0–0.5)
HCT: 36.7 % (ref 34.8–46.6)
HGB: 12.7 g/dL (ref 11.6–15.9)
MONO#: 1.1 10*3/uL — ABNORMAL HIGH (ref 0.1–0.9)
NEUT#: 10.4 10*3/uL — ABNORMAL HIGH (ref 1.5–6.5)
NEUT%: 87 % — ABNORMAL HIGH (ref 39.6–76.8)
WBC: 12 10*3/uL — ABNORMAL HIGH (ref 3.9–10.0)
lymph#: 0.5 10*3/uL — ABNORMAL LOW (ref 0.9–3.3)

## 2008-08-10 ENCOUNTER — Ambulatory Visit (HOSPITAL_COMMUNITY): Admission: RE | Admit: 2008-08-10 | Discharge: 2008-08-10 | Payer: Self-pay | Admitting: Family Medicine

## 2008-08-16 ENCOUNTER — Emergency Department (HOSPITAL_COMMUNITY): Admission: EM | Admit: 2008-08-16 | Discharge: 2008-08-16 | Payer: Self-pay | Admitting: Emergency Medicine

## 2008-08-25 LAB — COMPREHENSIVE METABOLIC PANEL
ALT: 17 U/L (ref 0–35)
CO2: 22 mEq/L (ref 19–32)
Sodium: 137 mEq/L (ref 135–145)
Total Bilirubin: 0.6 mg/dL (ref 0.3–1.2)
Total Protein: 7 g/dL (ref 6.0–8.3)

## 2008-08-25 LAB — CBC WITH DIFFERENTIAL/PLATELET
BASO%: 0.1 % (ref 0.0–2.0)
EOS%: 0 % (ref 0.0–7.0)
LYMPH%: 4.7 % — ABNORMAL LOW (ref 14.0–48.0)
MCHC: 34 g/dL (ref 32.0–36.0)
MCV: 89.4 fL (ref 81.0–101.0)
MONO%: 10.5 % (ref 0.0–13.0)
Platelets: 423 10*3/uL — ABNORMAL HIGH (ref 145–400)
RBC: 3.9 10*6/uL (ref 3.70–5.32)
WBC: 10.2 10*3/uL — ABNORMAL HIGH (ref 3.9–10.0)

## 2008-09-11 ENCOUNTER — Ambulatory Visit: Payer: Self-pay | Admitting: Internal Medicine

## 2008-09-15 LAB — CBC WITH DIFFERENTIAL/PLATELET
BASO%: 0.3 % (ref 0.0–2.0)
Basophils Absolute: 0 10*3/uL (ref 0.0–0.1)
EOS%: 0 % (ref 0.0–7.0)
Eosinophils Absolute: 0 10*3/uL (ref 0.0–0.5)
HCT: 36 % (ref 34.8–46.6)
HGB: 12 g/dL (ref 11.6–15.9)
LYMPH%: 5.4 % — ABNORMAL LOW (ref 14.0–48.0)
MCH: 30.4 pg (ref 26.0–34.0)
MCHC: 33.3 g/dL (ref 32.0–36.0)
MCV: 91.3 fL (ref 81.0–101.0)
MONO#: 1.1 10*3/uL — ABNORMAL HIGH (ref 0.1–0.9)
MONO%: 11.1 % (ref 0.0–13.0)
NEUT#: 8.3 10*3/uL — ABNORMAL HIGH (ref 1.5–6.5)
NEUT%: 83.2 % — ABNORMAL HIGH (ref 39.6–76.8)
Platelets: 372 10*3/uL (ref 145–400)
RBC: 3.95 10*6/uL (ref 3.70–5.32)
RDW: 15.4 % — ABNORMAL HIGH (ref 11.3–14.5)
WBC: 10 10*3/uL (ref 3.9–10.0)
lymph#: 0.5 10*3/uL — ABNORMAL LOW (ref 0.9–3.3)

## 2008-09-15 LAB — COMPREHENSIVE METABOLIC PANEL
BUN: 11 mg/dL (ref 6–23)
CO2: 24 mEq/L (ref 19–32)
Calcium: 10 mg/dL (ref 8.4–10.5)
Chloride: 103 mEq/L (ref 96–112)
Creatinine, Ser: 0.82 mg/dL (ref 0.40–1.20)
Total Bilirubin: 0.4 mg/dL (ref 0.3–1.2)

## 2008-09-30 ENCOUNTER — Ambulatory Visit (HOSPITAL_COMMUNITY): Admission: RE | Admit: 2008-09-30 | Discharge: 2008-09-30 | Payer: Self-pay | Admitting: Internal Medicine

## 2008-10-06 LAB — CBC WITH DIFFERENTIAL/PLATELET
Basophils Absolute: 0 10*3/uL (ref 0.0–0.1)
Eosinophils Absolute: 0 10*3/uL (ref 0.0–0.5)
HGB: 12.6 g/dL (ref 11.6–15.9)
MONO#: 1 10*3/uL — ABNORMAL HIGH (ref 0.1–0.9)
NEUT#: 8.5 10*3/uL — ABNORMAL HIGH (ref 1.5–6.5)
RDW: 16.2 % — ABNORMAL HIGH (ref 11.3–14.5)
WBC: 10.2 10*3/uL — ABNORMAL HIGH (ref 3.9–10.0)
lymph#: 0.6 10*3/uL — ABNORMAL LOW (ref 0.9–3.3)

## 2008-10-06 LAB — COMPREHENSIVE METABOLIC PANEL
Albumin: 3.6 g/dL (ref 3.5–5.2)
BUN: 14 mg/dL (ref 6–23)
Calcium: 9.8 mg/dL (ref 8.4–10.5)
Chloride: 103 mEq/L (ref 96–112)
Glucose, Bld: 154 mg/dL — ABNORMAL HIGH (ref 70–99)
Potassium: 4.5 mEq/L (ref 3.5–5.3)
Sodium: 136 mEq/L (ref 135–145)
Total Protein: 6.8 g/dL (ref 6.0–8.3)

## 2008-10-23 ENCOUNTER — Ambulatory Visit: Payer: Self-pay | Admitting: Internal Medicine

## 2008-10-27 LAB — CBC WITH DIFFERENTIAL/PLATELET
Basophils Absolute: 0 10*3/uL (ref 0.0–0.1)
EOS%: 0.1 % (ref 0.0–7.0)
Eosinophils Absolute: 0 10*3/uL (ref 0.0–0.5)
HGB: 12.3 g/dL (ref 11.6–15.9)
NEUT#: 8.5 10*3/uL — ABNORMAL HIGH (ref 1.5–6.5)
RDW: 15.8 % — ABNORMAL HIGH (ref 11.3–14.5)
lymph#: 0.6 10*3/uL — ABNORMAL LOW (ref 0.9–3.3)

## 2008-11-03 ENCOUNTER — Ambulatory Visit (HOSPITAL_COMMUNITY): Admission: RE | Admit: 2008-11-03 | Discharge: 2008-11-03 | Payer: Self-pay | Admitting: Thoracic Surgery

## 2008-11-03 ENCOUNTER — Ambulatory Visit: Payer: Self-pay | Admitting: Thoracic Surgery

## 2008-11-04 ENCOUNTER — Ambulatory Visit: Payer: Self-pay | Admitting: Thoracic Surgery

## 2008-11-17 LAB — CBC WITH DIFFERENTIAL/PLATELET
Basophils Absolute: 0 10*3/uL (ref 0.0–0.1)
Eosinophils Absolute: 0 10*3/uL (ref 0.0–0.5)
HGB: 12.1 g/dL (ref 11.6–15.9)
MCV: 90.9 fL (ref 81.0–101.0)
MONO%: 10.5 % (ref 0.0–13.0)
NEUT#: 8.7 10*3/uL — ABNORMAL HIGH (ref 1.5–6.5)
RDW: 15 % — ABNORMAL HIGH (ref 11.3–14.5)

## 2008-11-17 LAB — COMPREHENSIVE METABOLIC PANEL
Albumin: 3.7 g/dL (ref 3.5–5.2)
Alkaline Phosphatase: 82 U/L (ref 39–117)
BUN: 14 mg/dL (ref 6–23)
CO2: 22 mEq/L (ref 19–32)
Calcium: 9.7 mg/dL (ref 8.4–10.5)
Chloride: 105 mEq/L (ref 96–112)
Glucose, Bld: 117 mg/dL — ABNORMAL HIGH (ref 70–99)
Potassium: 4.5 mEq/L (ref 3.5–5.3)

## 2008-12-04 ENCOUNTER — Ambulatory Visit (HOSPITAL_COMMUNITY): Admission: RE | Admit: 2008-12-04 | Discharge: 2008-12-04 | Payer: Self-pay | Admitting: Internal Medicine

## 2008-12-04 ENCOUNTER — Ambulatory Visit: Payer: Self-pay | Admitting: Internal Medicine

## 2008-12-08 LAB — CBC WITH DIFFERENTIAL/PLATELET
Basophils Absolute: 0 10*3/uL (ref 0.0–0.1)
Eosinophils Absolute: 0 10*3/uL (ref 0.0–0.5)
HGB: 12.4 g/dL (ref 11.6–15.9)
MCV: 95.6 fL (ref 81.0–101.0)
MONO#: 1.1 10*3/uL — ABNORMAL HIGH (ref 0.1–0.9)
MONO%: 14.5 % — ABNORMAL HIGH (ref 0.0–13.0)
NEUT#: 5.9 10*3/uL (ref 1.5–6.5)
RBC: 3.83 10*6/uL (ref 3.70–5.32)
RDW: 15.3 % — ABNORMAL HIGH (ref 11.3–14.5)
WBC: 7.7 10*3/uL (ref 3.9–10.0)
lymph#: 0.7 10*3/uL — ABNORMAL LOW (ref 0.9–3.3)

## 2008-12-08 LAB — COMPREHENSIVE METABOLIC PANEL
Albumin: 3.6 g/dL (ref 3.5–5.2)
BUN: 16 mg/dL (ref 6–23)
CO2: 23 mEq/L (ref 19–32)
Calcium: 9.8 mg/dL (ref 8.4–10.5)
Chloride: 104 mEq/L (ref 96–112)
Glucose, Bld: 84 mg/dL (ref 70–99)
Potassium: 4.4 mEq/L (ref 3.5–5.3)
Sodium: 136 mEq/L (ref 135–145)
Total Protein: 6.2 g/dL (ref 6.0–8.3)

## 2008-12-29 LAB — CBC WITH DIFFERENTIAL/PLATELET
BASO%: 0.2 % (ref 0.0–2.0)
Basophils Absolute: 0 10*3/uL (ref 0.0–0.1)
EOS%: 0.2 % (ref 0.0–7.0)
HCT: 38.2 % (ref 34.8–46.6)
HGB: 13.1 g/dL (ref 11.6–15.9)
LYMPH%: 5.1 % — ABNORMAL LOW (ref 14.0–48.0)
MCH: 32.8 pg (ref 26.0–34.0)
MCHC: 34.4 g/dL (ref 32.0–36.0)
MCV: 95.3 fL (ref 81.0–101.0)
MONO%: 7.3 % (ref 0.0–13.0)
NEUT%: 87.2 % — ABNORMAL HIGH (ref 39.6–76.8)
Platelets: 341 10*3/uL (ref 145–400)
lymph#: 0.5 10*3/uL — ABNORMAL LOW (ref 0.9–3.3)

## 2008-12-29 LAB — COMPREHENSIVE METABOLIC PANEL
ALT: 15 U/L (ref 0–35)
Alkaline Phosphatase: 88 U/L (ref 39–117)
Creatinine, Ser: 0.72 mg/dL (ref 0.40–1.20)
Sodium: 137 mEq/L (ref 135–145)
Total Bilirubin: 0.6 mg/dL (ref 0.3–1.2)
Total Protein: 6.6 g/dL (ref 6.0–8.3)

## 2009-01-19 ENCOUNTER — Ambulatory Visit: Payer: Self-pay | Admitting: Internal Medicine

## 2009-01-19 LAB — COMPREHENSIVE METABOLIC PANEL
Albumin: 3.3 g/dL — ABNORMAL LOW (ref 3.5–5.2)
Alkaline Phosphatase: 83 U/L (ref 39–117)
BUN: 13 mg/dL (ref 6–23)
CO2: 26 mEq/L (ref 19–32)
Glucose, Bld: 115 mg/dL — ABNORMAL HIGH (ref 70–99)
Potassium: 4.4 mEq/L (ref 3.5–5.3)
Total Bilirubin: 0.8 mg/dL (ref 0.3–1.2)
Total Protein: 6.2 g/dL (ref 6.0–8.3)

## 2009-01-19 LAB — CBC WITH DIFFERENTIAL/PLATELET
Basophils Absolute: 0 10*3/uL (ref 0.0–0.1)
Eosinophils Absolute: 0 10*3/uL (ref 0.0–0.5)
HGB: 12.6 g/dL (ref 11.6–15.9)
MCV: 94.5 fL (ref 79.5–101.0)
MONO#: 0.9 10*3/uL (ref 0.1–0.9)
MONO%: 9.4 % (ref 0.0–14.0)
NEUT#: 8 10*3/uL — ABNORMAL HIGH (ref 1.5–6.5)
RBC: 3.85 10*6/uL (ref 3.70–5.45)
RDW: 15.1 % — ABNORMAL HIGH (ref 11.2–14.5)
WBC: 9.3 10*3/uL (ref 3.9–10.3)

## 2009-02-04 ENCOUNTER — Ambulatory Visit (HOSPITAL_COMMUNITY): Admission: RE | Admit: 2009-02-04 | Discharge: 2009-02-04 | Payer: Self-pay | Admitting: Internal Medicine

## 2009-02-08 LAB — COMPREHENSIVE METABOLIC PANEL
ALT: 15 U/L (ref 0–35)
AST: 19 U/L (ref 0–37)
Alkaline Phosphatase: 93 U/L (ref 39–117)
BUN: 12 mg/dL (ref 6–23)
Chloride: 102 mEq/L (ref 96–112)
Creatinine, Ser: 0.85 mg/dL (ref 0.40–1.20)
Total Bilirubin: 0.6 mg/dL (ref 0.3–1.2)

## 2009-02-08 LAB — CBC WITH DIFFERENTIAL/PLATELET
BASO%: 0.3 % (ref 0.0–2.0)
Basophils Absolute: 0 10*3/uL (ref 0.0–0.1)
EOS%: 1.1 % (ref 0.0–7.0)
HCT: 37.1 % (ref 34.8–46.6)
HGB: 12.7 g/dL (ref 11.6–15.9)
MCH: 32.8 pg (ref 25.1–34.0)
MCHC: 34.2 g/dL (ref 31.5–36.0)
MCV: 95.7 fL (ref 79.5–101.0)
MONO%: 17.1 % — ABNORMAL HIGH (ref 0.0–14.0)
NEUT%: 71.3 % (ref 38.4–76.8)
RDW: 15.7 % — ABNORMAL HIGH (ref 11.2–14.5)
lymph#: 0.6 10*3/uL — ABNORMAL LOW (ref 0.9–3.3)

## 2009-02-25 ENCOUNTER — Ambulatory Visit: Payer: Self-pay | Admitting: Internal Medicine

## 2009-03-01 LAB — COMPREHENSIVE METABOLIC PANEL
ALT: 10 U/L (ref 0–35)
AST: 18 U/L (ref 0–37)
Albumin: 3.6 g/dL (ref 3.5–5.2)
Alkaline Phosphatase: 93 U/L (ref 39–117)
BUN: 10 mg/dL (ref 6–23)
Calcium: 9.6 mg/dL (ref 8.4–10.5)
Chloride: 101 mEq/L (ref 96–112)
Potassium: 4.4 mEq/L (ref 3.5–5.3)
Sodium: 137 mEq/L (ref 135–145)
Total Protein: 6.5 g/dL (ref 6.0–8.3)

## 2009-03-01 LAB — CBC WITH DIFFERENTIAL/PLATELET
EOS%: 1.4 % (ref 0.0–7.0)
Eosinophils Absolute: 0.1 10*3/uL (ref 0.0–0.5)
MCH: 32.6 pg (ref 25.1–34.0)
MCV: 96.1 fL (ref 79.5–101.0)
MONO%: 13.3 % (ref 0.0–14.0)
NEUT#: 4.5 10*3/uL (ref 1.5–6.5)
RBC: 3.87 10*6/uL (ref 3.70–5.45)
RDW: 15.7 % — ABNORMAL HIGH (ref 11.2–14.5)

## 2009-03-22 LAB — CBC WITH DIFFERENTIAL/PLATELET
BASO%: 0.3 % (ref 0.0–2.0)
EOS%: 1.1 % (ref 0.0–7.0)
MCH: 33 pg (ref 25.1–34.0)
MCHC: 34.4 g/dL (ref 31.5–36.0)
MCV: 96 fL (ref 79.5–101.0)
MONO%: 14.4 % — ABNORMAL HIGH (ref 0.0–14.0)
RBC: 3.75 10*6/uL (ref 3.70–5.45)
RDW: 15.7 % — ABNORMAL HIGH (ref 11.2–14.5)
lymph#: 0.5 10*3/uL — ABNORMAL LOW (ref 0.9–3.3)

## 2009-03-22 LAB — COMPREHENSIVE METABOLIC PANEL WITH GFR
ALT: 10 U/L (ref 0–35)
AST: 16 U/L (ref 0–37)
Albumin: 3.5 g/dL (ref 3.5–5.2)
Alkaline Phosphatase: 86 U/L (ref 39–117)
BUN: 12 mg/dL (ref 6–23)
CO2: 27 meq/L (ref 19–32)
Calcium: 9.7 mg/dL (ref 8.4–10.5)
Chloride: 99 meq/L (ref 96–112)
Creatinine, Ser: 0.93 mg/dL (ref 0.40–1.20)
Glucose, Bld: 107 mg/dL — ABNORMAL HIGH (ref 70–99)
Potassium: 4.1 meq/L (ref 3.5–5.3)
Sodium: 137 meq/L (ref 135–145)
Total Bilirubin: 0.6 mg/dL (ref 0.3–1.2)
Total Protein: 6.1 g/dL (ref 6.0–8.3)

## 2009-04-06 ENCOUNTER — Ambulatory Visit (HOSPITAL_COMMUNITY): Admission: RE | Admit: 2009-04-06 | Discharge: 2009-04-06 | Payer: Self-pay | Admitting: Internal Medicine

## 2009-04-08 ENCOUNTER — Ambulatory Visit: Payer: Self-pay | Admitting: Internal Medicine

## 2009-04-12 LAB — CBC WITH DIFFERENTIAL/PLATELET
Eosinophils Absolute: 0 10*3/uL (ref 0.0–0.5)
HGB: 12.5 g/dL (ref 11.6–15.9)
MCV: 96.3 fL (ref 79.5–101.0)
MONO#: 0.2 10*3/uL (ref 0.1–0.9)
MONO%: 3.8 % (ref 0.0–14.0)
NEUT#: 3.4 10*3/uL (ref 1.5–6.5)
RBC: 3.84 10*6/uL (ref 3.70–5.45)
RDW: 15.5 % — ABNORMAL HIGH (ref 11.2–14.5)
WBC: 4 10*3/uL (ref 3.9–10.3)
lymph#: 0.4 10*3/uL — ABNORMAL LOW (ref 0.9–3.3)

## 2009-04-12 LAB — COMPREHENSIVE METABOLIC PANEL
Albumin: 3.5 g/dL (ref 3.5–5.2)
Alkaline Phosphatase: 94 U/L (ref 39–117)
Calcium: 10.1 mg/dL (ref 8.4–10.5)
Chloride: 101 mEq/L (ref 96–112)
Glucose, Bld: 119 mg/dL — ABNORMAL HIGH (ref 70–99)
Potassium: 4.4 mEq/L (ref 3.5–5.3)
Sodium: 137 mEq/L (ref 135–145)
Total Protein: 6.7 g/dL (ref 6.0–8.3)

## 2009-05-04 LAB — COMPREHENSIVE METABOLIC PANEL
ALT: 10 U/L (ref 0–35)
AST: 15 U/L (ref 0–37)
CO2: 25 mEq/L (ref 19–32)
Creatinine, Ser: 0.7 mg/dL (ref 0.40–1.20)
Total Bilirubin: 0.4 mg/dL (ref 0.3–1.2)

## 2009-05-04 LAB — CBC WITH DIFFERENTIAL/PLATELET
BASO%: 0 % (ref 0.0–2.0)
LYMPH%: 6.6 % — ABNORMAL LOW (ref 14.0–49.7)
MCH: 31.3 pg (ref 25.1–34.0)
MCHC: 33.3 g/dL (ref 31.5–36.0)
MCV: 93.8 fL (ref 79.5–101.0)
MONO%: 10.8 % (ref 0.0–14.0)
Platelets: 309 10*3/uL (ref 145–400)
RBC: 3.84 10*6/uL (ref 3.70–5.45)
nRBC: 0 % (ref 0–0)

## 2009-05-24 ENCOUNTER — Ambulatory Visit: Payer: Self-pay | Admitting: Internal Medicine

## 2009-05-24 LAB — CBC WITH DIFFERENTIAL/PLATELET
Basophils Absolute: 0 10*3/uL (ref 0.0–0.1)
Eosinophils Absolute: 0 10*3/uL (ref 0.0–0.5)
HGB: 11.9 g/dL (ref 11.6–15.9)
MCV: 96 fL (ref 79.5–101.0)
MONO#: 0.4 10*3/uL (ref 0.1–0.9)
NEUT#: 4.5 10*3/uL (ref 1.5–6.5)
Platelets: 355 10*3/uL (ref 145–400)
RBC: 3.6 10*6/uL — ABNORMAL LOW (ref 3.70–5.45)
RDW: 16.6 % — ABNORMAL HIGH (ref 11.2–14.5)
WBC: 5.4 10*3/uL (ref 3.9–10.3)

## 2009-05-24 LAB — COMPREHENSIVE METABOLIC PANEL
Albumin: 3.4 g/dL — ABNORMAL LOW (ref 3.5–5.2)
BUN: 12 mg/dL (ref 6–23)
CO2: 28 mEq/L (ref 19–32)
Calcium: 9.9 mg/dL (ref 8.4–10.5)
Glucose, Bld: 116 mg/dL — ABNORMAL HIGH (ref 70–99)
Potassium: 4.2 mEq/L (ref 3.5–5.3)
Sodium: 139 mEq/L (ref 135–145)
Total Protein: 6 g/dL (ref 6.0–8.3)

## 2009-06-10 ENCOUNTER — Ambulatory Visit (HOSPITAL_COMMUNITY): Admission: RE | Admit: 2009-06-10 | Discharge: 2009-06-10 | Payer: Self-pay | Admitting: Internal Medicine

## 2009-06-14 LAB — CBC WITH DIFFERENTIAL/PLATELET
BASO%: 0 % (ref 0.0–2.0)
Basophils Absolute: 0 10*3/uL (ref 0.0–0.1)
EOS%: 0 % (ref 0.0–7.0)
Eosinophils Absolute: 0 10*3/uL (ref 0.0–0.5)
HCT: 36.7 % (ref 34.8–46.6)
HGB: 12.6 g/dL (ref 11.6–15.9)
LYMPH%: 6.9 % — ABNORMAL LOW (ref 14.0–49.7)
MCH: 33.2 pg (ref 25.1–34.0)
MCHC: 34.3 g/dL (ref 31.5–36.0)
MCV: 96.7 fL (ref 79.5–101.0)
MONO#: 0.1 10*3/uL (ref 0.1–0.9)
MONO%: 3.2 % (ref 0.0–14.0)
NEUT#: 3.7 10*3/uL (ref 1.5–6.5)
NEUT%: 89.9 % — ABNORMAL HIGH (ref 38.4–76.8)
Platelets: 376 10*3/uL (ref 145–400)
RBC: 3.8 10*6/uL (ref 3.70–5.45)
RDW: 16.3 % — ABNORMAL HIGH (ref 11.2–14.5)
WBC: 4.1 10*3/uL (ref 3.9–10.3)
lymph#: 0.3 10*3/uL — ABNORMAL LOW (ref 0.9–3.3)

## 2009-06-14 LAB — COMPREHENSIVE METABOLIC PANEL WITH GFR
ALT: 18 U/L (ref 0–35)
AST: 29 U/L (ref 0–37)
Albumin: 3.2 g/dL — ABNORMAL LOW (ref 3.5–5.2)
Alkaline Phosphatase: 87 U/L (ref 39–117)
BUN: 11 mg/dL (ref 6–23)
CO2: 28 meq/L (ref 19–32)
Calcium: 9.8 mg/dL (ref 8.4–10.5)
Chloride: 101 meq/L (ref 96–112)
Creatinine, Ser: 0.86 mg/dL (ref 0.40–1.20)
Glucose, Bld: 126 mg/dL — ABNORMAL HIGH (ref 70–99)
Potassium: 4 meq/L (ref 3.5–5.3)
Sodium: 137 meq/L (ref 135–145)
Total Bilirubin: 0.8 mg/dL (ref 0.3–1.2)
Total Protein: 6.6 g/dL (ref 6.0–8.3)

## 2009-06-28 ENCOUNTER — Encounter: Admission: RE | Admit: 2009-06-28 | Discharge: 2009-06-28 | Payer: Self-pay | Admitting: Family Medicine

## 2009-07-01 ENCOUNTER — Ambulatory Visit: Payer: Self-pay | Admitting: Internal Medicine

## 2009-07-05 LAB — COMPREHENSIVE METABOLIC PANEL
Alkaline Phosphatase: 83 U/L (ref 39–117)
BUN: 16 mg/dL (ref 6–23)
CO2: 33 mEq/L — ABNORMAL HIGH (ref 19–32)
Creatinine, Ser: 0.94 mg/dL (ref 0.40–1.20)
Glucose, Bld: 165 mg/dL — ABNORMAL HIGH (ref 70–99)
Total Bilirubin: 0.6 mg/dL (ref 0.3–1.2)

## 2009-07-05 LAB — CBC WITH DIFFERENTIAL/PLATELET
Basophils Absolute: 0 10*3/uL (ref 0.0–0.1)
Eosinophils Absolute: 0 10*3/uL (ref 0.0–0.5)
HCT: 35.2 % (ref 34.8–46.6)
HGB: 12 g/dL (ref 11.6–15.9)
LYMPH%: 9 % — ABNORMAL LOW (ref 14.0–49.7)
MCV: 96.7 fL (ref 79.5–101.0)
MONO%: 9.8 % (ref 0.0–14.0)
NEUT#: 3.8 10*3/uL (ref 1.5–6.5)
NEUT%: 80.2 % — ABNORMAL HIGH (ref 38.4–76.8)
Platelets: 346 10*3/uL (ref 145–400)
RBC: 3.64 10*6/uL — ABNORMAL LOW (ref 3.70–5.45)

## 2009-07-26 LAB — COMPREHENSIVE METABOLIC PANEL
ALT: 12 U/L (ref 0–35)
Albumin: 3.5 g/dL (ref 3.5–5.2)
CO2: 29 mEq/L (ref 19–32)
Chloride: 97 mEq/L (ref 96–112)
Potassium: 3.7 mEq/L (ref 3.5–5.3)
Sodium: 139 mEq/L (ref 135–145)
Total Bilirubin: 0.7 mg/dL (ref 0.3–1.2)
Total Protein: 6.2 g/dL (ref 6.0–8.3)

## 2009-07-26 LAB — CBC WITH DIFFERENTIAL/PLATELET
BASO%: 0.2 % (ref 0.0–2.0)
LYMPH%: 14.8 % (ref 14.0–49.7)
MCHC: 33.2 g/dL (ref 31.5–36.0)
MONO#: 0.8 10*3/uL (ref 0.1–0.9)
NEUT#: 3.1 10*3/uL (ref 1.5–6.5)
RBC: 3.77 10*6/uL (ref 3.70–5.45)
RDW: 16.6 % — ABNORMAL HIGH (ref 11.2–14.5)
WBC: 4.7 10*3/uL (ref 3.9–10.3)
lymph#: 0.7 10*3/uL — ABNORMAL LOW (ref 0.9–3.3)

## 2009-08-12 ENCOUNTER — Ambulatory Visit: Payer: Self-pay | Admitting: Internal Medicine

## 2009-08-12 ENCOUNTER — Ambulatory Visit (HOSPITAL_COMMUNITY): Admission: RE | Admit: 2009-08-12 | Discharge: 2009-08-12 | Payer: Self-pay | Admitting: Internal Medicine

## 2009-08-16 LAB — COMPREHENSIVE METABOLIC PANEL
Alkaline Phosphatase: 79 U/L (ref 39–117)
Creatinine, Ser: 1.15 mg/dL (ref 0.40–1.20)
Glucose, Bld: 153 mg/dL — ABNORMAL HIGH (ref 70–99)
Sodium: 141 mEq/L (ref 135–145)
Total Bilirubin: 0.6 mg/dL (ref 0.3–1.2)
Total Protein: 6.4 g/dL (ref 6.0–8.3)

## 2009-08-16 LAB — CBC WITH DIFFERENTIAL/PLATELET
Eosinophils Absolute: 0 10*3/uL (ref 0.0–0.5)
LYMPH%: 5.8 % — ABNORMAL LOW (ref 14.0–49.7)
MCHC: 34.8 g/dL (ref 31.5–36.0)
MCV: 98.5 fL (ref 79.5–101.0)
MONO%: 5.3 % (ref 0.0–14.0)
NEUT#: 4.4 10*3/uL (ref 1.5–6.5)
Platelets: 379 10*3/uL (ref 145–400)
RBC: 3.46 10*6/uL — ABNORMAL LOW (ref 3.70–5.45)

## 2009-08-26 ENCOUNTER — Ambulatory Visit (HOSPITAL_COMMUNITY): Admission: RE | Admit: 2009-08-26 | Discharge: 2009-08-26 | Payer: Self-pay | Admitting: Family Medicine

## 2009-09-07 LAB — CBC WITH DIFFERENTIAL/PLATELET
Basophils Absolute: 0 10*3/uL (ref 0.0–0.1)
EOS%: 0 % (ref 0.0–7.0)
Eosinophils Absolute: 0 10*3/uL (ref 0.0–0.5)
HGB: 11.4 g/dL — ABNORMAL LOW (ref 11.6–15.9)
NEUT#: 7.7 10*3/uL — ABNORMAL HIGH (ref 1.5–6.5)
RDW: 17.3 % — ABNORMAL HIGH (ref 11.2–14.5)
lymph#: 0.4 10*3/uL — ABNORMAL LOW (ref 0.9–3.3)

## 2009-09-07 LAB — COMPREHENSIVE METABOLIC PANEL
ALT: 15 U/L (ref 0–35)
Alkaline Phosphatase: 74 U/L (ref 39–117)
Sodium: 140 mEq/L (ref 135–145)
Total Bilirubin: 0.6 mg/dL (ref 0.3–1.2)
Total Protein: 5.8 g/dL — ABNORMAL LOW (ref 6.0–8.3)

## 2009-09-23 ENCOUNTER — Ambulatory Visit: Payer: Self-pay | Admitting: Internal Medicine

## 2009-09-27 LAB — CBC WITH DIFFERENTIAL/PLATELET
Basophils Absolute: 0 10*3/uL (ref 0.0–0.1)
Eosinophils Absolute: 0 10*3/uL (ref 0.0–0.5)
HCT: 32 % — ABNORMAL LOW (ref 34.8–46.6)
HGB: 10.8 g/dL — ABNORMAL LOW (ref 11.6–15.9)
LYMPH%: 8.3 % — ABNORMAL LOW (ref 14.0–49.7)
MCV: 103.2 fL — ABNORMAL HIGH (ref 79.5–101.0)
MONO%: 4.9 % (ref 0.0–14.0)
NEUT#: 3.4 10*3/uL (ref 1.5–6.5)
NEUT%: 86.7 % — ABNORMAL HIGH (ref 38.4–76.8)
Platelets: 410 10*3/uL — ABNORMAL HIGH (ref 145–400)
RDW: 18.2 % — ABNORMAL HIGH (ref 11.2–14.5)

## 2009-09-27 LAB — COMPREHENSIVE METABOLIC PANEL
Albumin: 3.6 g/dL (ref 3.5–5.2)
Alkaline Phosphatase: 82 U/L (ref 39–117)
BUN: 16 mg/dL (ref 6–23)
Glucose, Bld: 130 mg/dL — ABNORMAL HIGH (ref 70–99)
Potassium: 4 mEq/L (ref 3.5–5.3)

## 2009-10-14 ENCOUNTER — Ambulatory Visit (HOSPITAL_COMMUNITY): Admission: RE | Admit: 2009-10-14 | Discharge: 2009-10-14 | Payer: Self-pay | Admitting: Oncology

## 2009-10-19 ENCOUNTER — Ambulatory Visit: Payer: Self-pay | Admitting: Internal Medicine

## 2009-10-19 LAB — CBC WITH DIFFERENTIAL/PLATELET
BASO%: 0.1 % (ref 0.0–2.0)
EOS%: 0.1 % (ref 0.0–7.0)
HGB: 9.9 g/dL — ABNORMAL LOW (ref 11.6–15.9)
MCH: 34.4 pg — ABNORMAL HIGH (ref 25.1–34.0)
MCHC: 32.7 g/dL (ref 31.5–36.0)
RBC: 2.88 10*6/uL — ABNORMAL LOW (ref 3.70–5.45)
RDW: 18.4 % — ABNORMAL HIGH (ref 11.2–14.5)
lymph#: 0.5 10*3/uL — ABNORMAL LOW (ref 0.9–3.3)

## 2009-10-19 LAB — COMPREHENSIVE METABOLIC PANEL
ALT: 10 U/L (ref 0–35)
AST: 16 U/L (ref 0–37)
Albumin: 3.5 g/dL (ref 3.5–5.2)
BUN: 18 mg/dL (ref 6–23)
Calcium: 9.3 mg/dL (ref 8.4–10.5)
Chloride: 101 mEq/L (ref 96–112)
Potassium: 3.5 mEq/L (ref 3.5–5.3)

## 2009-11-09 LAB — COMPREHENSIVE METABOLIC PANEL
Alkaline Phosphatase: 73 U/L (ref 39–117)
BUN: 12 mg/dL (ref 6–23)
Creatinine, Ser: 1.24 mg/dL — ABNORMAL HIGH (ref 0.40–1.20)
Glucose, Bld: 134 mg/dL — ABNORMAL HIGH (ref 70–99)
Total Bilirubin: 0.6 mg/dL (ref 0.3–1.2)

## 2009-11-09 LAB — CBC WITH DIFFERENTIAL/PLATELET
Basophils Absolute: 0 10*3/uL (ref 0.0–0.1)
Eosinophils Absolute: 0.1 10*3/uL (ref 0.0–0.5)
HGB: 9.2 g/dL — ABNORMAL LOW (ref 11.6–15.9)
MCV: 106.7 fL — ABNORMAL HIGH (ref 79.5–101.0)
MONO%: 21.6 % — ABNORMAL HIGH (ref 0.0–14.0)
NEUT#: 4.2 10*3/uL (ref 1.5–6.5)
RBC: 2.68 10*6/uL — ABNORMAL LOW (ref 3.70–5.45)
RDW: 18.4 % — ABNORMAL HIGH (ref 11.2–14.5)
WBC: 6.2 10*3/uL (ref 3.9–10.3)
lymph#: 0.6 10*3/uL — ABNORMAL LOW (ref 0.9–3.3)

## 2009-11-27 ENCOUNTER — Ambulatory Visit: Payer: Self-pay | Admitting: Internal Medicine

## 2009-11-30 LAB — COMPREHENSIVE METABOLIC PANEL
ALT: <8 U/L (ref 0–35)
AST: 11 U/L (ref 0–37)
Albumin: 3 g/dL — ABNORMAL LOW (ref 3.5–5.2)
CO2: 24 mEq/L (ref 19–32)
Calcium: 9 mg/dL (ref 8.4–10.5)
Chloride: 101 mEq/L (ref 96–112)
Potassium: 3.9 mEq/L (ref 3.5–5.3)
Sodium: 138 mEq/L (ref 135–145)
Total Protein: 5.4 g/dL — ABNORMAL LOW (ref 6.0–8.3)

## 2009-11-30 LAB — CBC WITH DIFFERENTIAL/PLATELET
BASO%: 0.1 % (ref 0.0–2.0)
EOS%: 0 % (ref 0.0–7.0)
HGB: 8 g/dL — ABNORMAL LOW (ref 11.6–15.9)
MCH: 34.9 pg — ABNORMAL HIGH (ref 25.1–34.0)
MCHC: 32.3 g/dL (ref 31.5–36.0)
MONO#: 1.1 10*3/uL — ABNORMAL HIGH (ref 0.1–0.9)
RDW: 17.9 % — ABNORMAL HIGH (ref 11.2–14.5)
WBC: 8.6 10*3/uL (ref 3.9–10.3)
lymph#: 0.7 10*3/uL — ABNORMAL LOW (ref 0.9–3.3)

## 2009-11-30 LAB — IRON AND TIBC: %SAT: 20 % (ref 20–55)

## 2009-12-04 ENCOUNTER — Ambulatory Visit: Payer: Self-pay | Admitting: Oncology

## 2009-12-04 ENCOUNTER — Inpatient Hospital Stay (HOSPITAL_COMMUNITY): Admission: EM | Admit: 2009-12-04 | Discharge: 2009-12-09 | Payer: Self-pay | Admitting: Emergency Medicine

## 2009-12-21 LAB — CBC WITH DIFFERENTIAL/PLATELET
Basophils Absolute: 0 10*3/uL (ref 0.0–0.1)
Eosinophils Absolute: 0 10*3/uL (ref 0.0–0.5)
HCT: 26.7 % — ABNORMAL LOW (ref 34.8–46.6)
HGB: 9.2 g/dL — ABNORMAL LOW (ref 11.6–15.9)
MCV: 100 fL (ref 79.5–101.0)
MONO%: 6.4 % (ref 0.0–14.0)
NEUT#: 10.1 10*3/uL — ABNORMAL HIGH (ref 1.5–6.5)
NEUT%: 88.3 % — ABNORMAL HIGH (ref 38.4–76.8)
Platelets: 369 10*3/uL (ref 145–400)
RDW: 21.2 % — ABNORMAL HIGH (ref 11.2–14.5)

## 2009-12-28 ENCOUNTER — Ambulatory Visit: Payer: Self-pay | Admitting: Internal Medicine

## 2009-12-28 LAB — CBC WITH DIFFERENTIAL/PLATELET
BASO%: 0.2 % (ref 0.0–2.0)
EOS%: 0 % (ref 0.0–7.0)
HCT: 32.1 % — ABNORMAL LOW (ref 34.8–46.6)
MCH: 32.7 pg (ref 25.1–34.0)
MCHC: 32.4 g/dL (ref 31.5–36.0)
MCV: 100.9 fL (ref 79.5–101.0)
MONO%: 9.2 % (ref 0.0–14.0)
NEUT%: 83.6 % — ABNORMAL HIGH (ref 38.4–76.8)
lymph#: 1 10*3/uL (ref 0.9–3.3)

## 2009-12-28 LAB — COMPREHENSIVE METABOLIC PANEL
AST: 21 U/L (ref 0–37)
Albumin: 3.3 g/dL — ABNORMAL LOW (ref 3.5–5.2)
Alkaline Phosphatase: 78 U/L (ref 39–117)
BUN: 16 mg/dL (ref 6–23)
Creatinine, Ser: 1.19 mg/dL (ref 0.40–1.20)
Glucose, Bld: 106 mg/dL — ABNORMAL HIGH (ref 70–99)
Potassium: 4.3 mEq/L (ref 3.5–5.3)
Total Bilirubin: 0.7 mg/dL (ref 0.3–1.2)

## 2010-01-18 LAB — CBC WITH DIFFERENTIAL/PLATELET
Basophils Absolute: 0 10*3/uL (ref 0.0–0.1)
EOS%: 0 % (ref 0.0–7.0)
Eosinophils Absolute: 0 10*3/uL (ref 0.0–0.5)
HCT: 28.6 % — ABNORMAL LOW (ref 34.8–46.6)
HGB: 9.2 g/dL — ABNORMAL LOW (ref 11.6–15.9)
LYMPH%: 8.8 % — ABNORMAL LOW (ref 14.0–49.7)
MCH: 33 pg (ref 25.1–34.0)
MCV: 102.5 fL — ABNORMAL HIGH (ref 79.5–101.0)
MONO%: 13.6 % (ref 0.0–14.0)
NEUT#: 6.3 10*3/uL (ref 1.5–6.5)
NEUT%: 77.6 % — ABNORMAL HIGH (ref 38.4–76.8)
Platelets: 301 10*3/uL (ref 145–400)

## 2010-01-18 LAB — COMPREHENSIVE METABOLIC PANEL
Alkaline Phosphatase: 76 U/L (ref 39–117)
Glucose, Bld: 127 mg/dL — ABNORMAL HIGH (ref 70–99)
Sodium: 137 mEq/L (ref 135–145)
Total Bilirubin: 0.7 mg/dL (ref 0.3–1.2)
Total Protein: 5.8 g/dL — ABNORMAL LOW (ref 6.0–8.3)

## 2010-02-03 ENCOUNTER — Ambulatory Visit: Payer: Self-pay | Admitting: Internal Medicine

## 2010-02-07 LAB — COMPREHENSIVE METABOLIC PANEL
ALT: 8 U/L (ref 0–35)
AST: 19 U/L (ref 0–37)
Albumin: 3.2 g/dL — ABNORMAL LOW (ref 3.5–5.2)
Alkaline Phosphatase: 77 U/L (ref 39–117)
Calcium: 9.3 mg/dL (ref 8.4–10.5)
Chloride: 101 mEq/L (ref 96–112)
Creatinine, Ser: 1.34 mg/dL — ABNORMAL HIGH (ref 0.40–1.20)
Potassium: 3.9 mEq/L (ref 3.5–5.3)

## 2010-02-07 LAB — CBC WITH DIFFERENTIAL/PLATELET
BASO%: 0.1 % (ref 0.0–2.0)
EOS%: 0.6 % (ref 0.0–7.0)
MCH: 35.4 pg — ABNORMAL HIGH (ref 25.1–34.0)
MCHC: 33.7 g/dL (ref 31.5–36.0)
RDW: 22.4 % — ABNORMAL HIGH (ref 11.2–14.5)
lymph#: 0.4 10*3/uL — ABNORMAL LOW (ref 0.9–3.3)

## 2010-03-01 LAB — COMPREHENSIVE METABOLIC PANEL
ALT: 9 U/L (ref 0–35)
AST: 22 U/L (ref 0–37)
Alkaline Phosphatase: 69 U/L (ref 39–117)
Creatinine, Ser: 1.38 mg/dL — ABNORMAL HIGH (ref 0.40–1.20)
Sodium: 138 mEq/L (ref 135–145)
Total Bilirubin: 0.6 mg/dL (ref 0.3–1.2)

## 2010-03-01 LAB — CBC WITH DIFFERENTIAL/PLATELET
BASO%: 0.2 % (ref 0.0–2.0)
EOS%: 0 % (ref 0.0–7.0)
HCT: 27.3 % — ABNORMAL LOW (ref 34.8–46.6)
LYMPH%: 13.7 % — ABNORMAL LOW (ref 14.0–49.7)
MCH: 33 pg (ref 25.1–34.0)
MCHC: 31.9 g/dL (ref 31.5–36.0)
MCV: 103.4 fL — ABNORMAL HIGH (ref 79.5–101.0)
MONO#: 1 10*3/uL — ABNORMAL HIGH (ref 0.1–0.9)
MONO%: 20.3 % — ABNORMAL HIGH (ref 0.0–14.0)
NEUT%: 65.8 % (ref 38.4–76.8)
Platelets: 313 10*3/uL (ref 145–400)

## 2010-03-15 ENCOUNTER — Ambulatory Visit: Payer: Self-pay | Admitting: Internal Medicine

## 2010-03-15 ENCOUNTER — Encounter (HOSPITAL_COMMUNITY): Admission: RE | Admit: 2010-03-15 | Discharge: 2010-06-13 | Payer: Self-pay | Admitting: Oncology

## 2010-03-15 LAB — CBC WITH DIFFERENTIAL/PLATELET
Basophils Absolute: 0 10*3/uL (ref 0.0–0.1)
EOS%: 0.4 % (ref 0.0–7.0)
HGB: 7.2 g/dL — ABNORMAL LOW (ref 11.6–15.9)
MCH: 32.9 pg (ref 25.1–34.0)
MCV: 99.5 fL (ref 79.5–101.0)
MONO%: 23.2 % — ABNORMAL HIGH (ref 0.0–14.0)
RDW: 15.9 % — ABNORMAL HIGH (ref 11.2–14.5)

## 2010-03-15 LAB — TYPE & CROSSMATCH - CHCC

## 2010-03-17 ENCOUNTER — Ambulatory Visit (HOSPITAL_COMMUNITY): Admission: RE | Admit: 2010-03-17 | Discharge: 2010-03-17 | Payer: Self-pay | Admitting: Internal Medicine

## 2010-03-22 LAB — CBC WITH DIFFERENTIAL/PLATELET
BASO%: 0.5 % (ref 0.0–2.0)
EOS%: 1.4 % (ref 0.0–7.0)
HCT: 32 % — ABNORMAL LOW (ref 34.8–46.6)
LYMPH%: 13.2 % — ABNORMAL LOW (ref 14.0–49.7)
MCH: 31.8 pg (ref 25.1–34.0)
MCHC: 32.2 g/dL (ref 31.5–36.0)
MONO%: 36.3 % — ABNORMAL HIGH (ref 0.0–14.0)
NEUT%: 48.6 % (ref 38.4–76.8)
Platelets: 267 10*3/uL (ref 145–400)
lymph#: 0.6 10*3/uL — ABNORMAL LOW (ref 0.9–3.3)

## 2010-05-24 ENCOUNTER — Ambulatory Visit: Payer: Self-pay | Admitting: Internal Medicine

## 2010-06-17 ENCOUNTER — Ambulatory Visit (HOSPITAL_COMMUNITY): Admission: RE | Admit: 2010-06-17 | Discharge: 2010-06-17 | Payer: Self-pay | Admitting: Internal Medicine

## 2010-06-17 LAB — CBC WITH DIFFERENTIAL/PLATELET
Basophils Absolute: 0 10*3/uL (ref 0.0–0.1)
Eosinophils Absolute: 0.1 10*3/uL (ref 0.0–0.5)
HCT: 34.3 % — ABNORMAL LOW (ref 34.8–46.6)
HGB: 11.9 g/dL (ref 11.6–15.9)
LYMPH%: 13.2 % — ABNORMAL LOW (ref 14.0–49.7)
MCV: 95.1 fL (ref 79.5–101.0)
MONO%: 12 % (ref 0.0–14.0)
NEUT#: 4 10*3/uL (ref 1.5–6.5)
Platelets: 228 10*3/uL (ref 145–400)

## 2010-06-17 LAB — COMPREHENSIVE METABOLIC PANEL
Albumin: 2.9 g/dL — ABNORMAL LOW (ref 3.5–5.2)
Alkaline Phosphatase: 93 U/L (ref 39–117)
BUN: 17 mg/dL (ref 6–23)
Glucose, Bld: 106 mg/dL — ABNORMAL HIGH (ref 70–99)
Potassium: 3.8 mEq/L (ref 3.5–5.3)
Total Bilirubin: 0.6 mg/dL (ref 0.3–1.2)

## 2010-07-01 ENCOUNTER — Ambulatory Visit: Payer: Self-pay | Admitting: Internal Medicine

## 2010-08-12 ENCOUNTER — Ambulatory Visit: Payer: Self-pay | Admitting: Internal Medicine

## 2010-09-14 ENCOUNTER — Ambulatory Visit: Payer: Self-pay | Admitting: Internal Medicine

## 2010-09-15 ENCOUNTER — Ambulatory Visit (HOSPITAL_COMMUNITY): Admission: RE | Admit: 2010-09-15 | Discharge: 2010-09-15 | Payer: Self-pay | Admitting: Internal Medicine

## 2010-09-15 LAB — CBC WITH DIFFERENTIAL/PLATELET
Basophils Absolute: 0 10*3/uL (ref 0.0–0.1)
Eosinophils Absolute: 0.1 10*3/uL (ref 0.0–0.5)
LYMPH%: 13.4 % — ABNORMAL LOW (ref 14.0–49.7)
MCV: 97.5 fL (ref 79.5–101.0)
MONO%: 11.8 % (ref 0.0–14.0)
NEUT#: 3.6 10*3/uL (ref 1.5–6.5)
Platelets: 211 10*3/uL (ref 145–400)
RBC: 3.54 10*6/uL — ABNORMAL LOW (ref 3.70–5.45)

## 2010-09-15 LAB — COMPREHENSIVE METABOLIC PANEL
Alkaline Phosphatase: 100 U/L (ref 39–117)
BUN: 19 mg/dL (ref 6–23)
Glucose, Bld: 95 mg/dL (ref 70–99)
Sodium: 139 mEq/L (ref 135–145)
Total Bilirubin: 0.6 mg/dL (ref 0.3–1.2)

## 2010-11-04 ENCOUNTER — Ambulatory Visit: Payer: Self-pay | Admitting: Internal Medicine

## 2010-12-13 ENCOUNTER — Ambulatory Visit: Payer: Self-pay | Admitting: Internal Medicine

## 2010-12-15 ENCOUNTER — Ambulatory Visit (HOSPITAL_COMMUNITY)
Admission: RE | Admit: 2010-12-15 | Discharge: 2010-12-15 | Payer: Self-pay | Source: Home / Self Care | Attending: Internal Medicine | Admitting: Internal Medicine

## 2010-12-15 LAB — CBC WITH DIFFERENTIAL/PLATELET
BASO%: 0.6 % (ref 0.0–2.0)
Basophils Absolute: 0 10*3/uL (ref 0.0–0.1)
EOS%: 3 % (ref 0.0–7.0)
Eosinophils Absolute: 0.2 10*3/uL (ref 0.0–0.5)
HCT: 35 % (ref 34.8–46.6)
HGB: 12.1 g/dL (ref 11.6–15.9)
LYMPH%: 14.9 % (ref 14.0–49.7)
MCH: 32.7 pg (ref 25.1–34.0)
MCHC: 34.5 g/dL (ref 31.5–36.0)
MCV: 94.8 fL (ref 79.5–101.0)
MONO#: 0.7 10*3/uL (ref 0.1–0.9)
MONO%: 12.4 % (ref 0.0–14.0)
NEUT#: 3.8 10*3/uL (ref 1.5–6.5)
NEUT%: 69.1 % (ref 38.4–76.8)
Platelets: 187 10*3/uL (ref 145–400)
RBC: 3.69 10*6/uL — ABNORMAL LOW (ref 3.70–5.45)
RDW: 13.6 % (ref 11.2–14.5)
WBC: 5.4 10*3/uL (ref 3.9–10.3)
lymph#: 0.8 10*3/uL — ABNORMAL LOW (ref 0.9–3.3)

## 2010-12-15 LAB — CMP (CANCER CENTER ONLY)
ALT(SGPT): 11 U/L (ref 10–47)
AST: 15 U/L (ref 11–38)
Albumin: 2.7 g/dL — ABNORMAL LOW (ref 3.3–5.5)
Alkaline Phosphatase: 96 U/L — ABNORMAL HIGH (ref 26–84)
BUN, Bld: 26 mg/dL — ABNORMAL HIGH (ref 7–22)
CO2: 31 mEq/L (ref 18–33)
Calcium: 9.4 mg/dL (ref 8.0–10.3)
Chloride: 96 mEq/L — ABNORMAL LOW (ref 98–108)
Creat: 1.9 mg/dl — ABNORMAL HIGH (ref 0.6–1.2)
Glucose, Bld: 88 mg/dL (ref 73–118)
Potassium: 4.1 mEq/L (ref 3.3–4.7)
Sodium: 136 mEq/L (ref 128–145)
Total Bilirubin: 0.7 mg/dl (ref 0.20–1.60)
Total Protein: 6.3 g/dL — ABNORMAL LOW (ref 6.4–8.1)

## 2010-12-18 ENCOUNTER — Encounter: Payer: Self-pay | Admitting: Internal Medicine

## 2010-12-19 ENCOUNTER — Encounter: Payer: Self-pay | Admitting: Obstetrics & Gynecology

## 2010-12-20 ENCOUNTER — Other Ambulatory Visit: Payer: Self-pay | Admitting: Internal Medicine

## 2010-12-20 DIAGNOSIS — C3490 Malignant neoplasm of unspecified part of unspecified bronchus or lung: Secondary | ICD-10-CM

## 2011-01-31 ENCOUNTER — Encounter (HOSPITAL_BASED_OUTPATIENT_CLINIC_OR_DEPARTMENT_OTHER): Payer: Medicare Other | Admitting: Internal Medicine

## 2011-01-31 DIAGNOSIS — Z452 Encounter for adjustment and management of vascular access device: Secondary | ICD-10-CM

## 2011-01-31 DIAGNOSIS — C3430 Malignant neoplasm of lower lobe, unspecified bronchus or lung: Secondary | ICD-10-CM

## 2011-02-12 LAB — BASIC METABOLIC PANEL
BUN: 29 mg/dL — ABNORMAL HIGH (ref 6–23)
BUN: 36 mg/dL — ABNORMAL HIGH (ref 6–23)
CO2: 21 mEq/L (ref 19–32)
CO2: 25 mEq/L (ref 19–32)
CO2: 25 mEq/L (ref 19–32)
Calcium: 8.2 mg/dL — ABNORMAL LOW (ref 8.4–10.5)
Calcium: 8.6 mg/dL (ref 8.4–10.5)
Calcium: 8.6 mg/dL (ref 8.4–10.5)
Calcium: 8.9 mg/dL (ref 8.4–10.5)
Chloride: 104 mEq/L (ref 96–112)
Creatinine, Ser: 1.17 mg/dL (ref 0.4–1.2)
Creatinine, Ser: 1.42 mg/dL — ABNORMAL HIGH (ref 0.4–1.2)
Creatinine, Ser: 1.69 mg/dL — ABNORMAL HIGH (ref 0.4–1.2)
GFR calc Af Amer: 44 mL/min — ABNORMAL LOW (ref >60)
GFR calc Af Amer: 55 mL/min — ABNORMAL LOW (ref >60)
GFR calc non Af Amer: 30 mL/min — ABNORMAL LOW (ref >60)
GFR calc non Af Amer: 37 mL/min — ABNORMAL LOW (ref >60)
GFR calc non Af Amer: 46 mL/min — ABNORMAL LOW (ref >60)
GFR calc non Af Amer: 46 mL/min — ABNORMAL LOW (ref >60)
Glucose, Bld: 122 mg/dL — ABNORMAL HIGH (ref 70–99)
Glucose, Bld: 172 mg/dL — ABNORMAL HIGH (ref 70–99)
Glucose, Bld: 94 mg/dL (ref 70–99)
Potassium: 3.1 mEq/L — ABNORMAL LOW (ref 3.5–5.1)
Sodium: 133 mEq/L — ABNORMAL LOW (ref 135–145)
Sodium: 133 mEq/L — ABNORMAL LOW (ref 135–145)
Sodium: 136 mEq/L (ref 135–145)

## 2011-02-12 LAB — DIFFERENTIAL
Basophils Absolute: 0 10*3/uL (ref 0.0–0.1)
Basophils Relative: 0 % (ref 0–1)
Basophils Relative: 0 % (ref 0–1)
Eosinophils Absolute: 0 10*3/uL (ref 0.0–0.7)
Eosinophils Absolute: 0 10*3/uL (ref 0.0–0.7)
Eosinophils Relative: 0 % (ref 0–5)
Eosinophils Relative: 3 % (ref 0–5)
Lymphocytes Relative: 10 % — ABNORMAL LOW (ref 12–46)
Lymphs Abs: 0 10*3/uL — ABNORMAL LOW (ref 0.7–4.0)
Lymphs Abs: 0 10*3/uL — ABNORMAL LOW (ref 0.7–4.0)
Monocytes Absolute: 0 10*3/uL — ABNORMAL LOW (ref 0.1–1.0)
Monocytes Absolute: 0 10*3/uL — ABNORMAL LOW (ref 0.1–1.0)
Monocytes Absolute: 0 10*3/uL — ABNORMAL LOW (ref 0.1–1.0)
Monocytes Absolute: 0 10*3/uL — ABNORMAL LOW (ref 0.1–1.0)
Monocytes Relative: 2 % — ABNORMAL LOW (ref 3–12)
Neutro Abs: 3.3 10*3/uL (ref 1.7–7.7)
Neutro Abs: 4.3 10*3/uL (ref 1.7–7.7)
Neutrophils Relative %: 0 % — ABNORMAL LOW (ref 43–77)
Neutrophils Relative %: 85 % — ABNORMAL HIGH (ref 43–77)
Neutrophils Relative %: 99 % — ABNORMAL HIGH (ref 43–77)

## 2011-02-12 LAB — URINE MICROSCOPIC-ADD ON

## 2011-02-12 LAB — CBC
HCT: 22.6 % — ABNORMAL LOW (ref 36.0–46.0)
HCT: 23.2 % — ABNORMAL LOW (ref 36.0–46.0)
HCT: 24.7 % — ABNORMAL LOW (ref 36.0–46.0)
HCT: 28 % — ABNORMAL LOW (ref 36.0–46.0)
Hemoglobin: 7.8 g/dL — ABNORMAL LOW (ref 12.0–15.0)
Hemoglobin: 7.8 g/dL — ABNORMAL LOW (ref 12.0–15.0)
Hemoglobin: 8.5 g/dL — ABNORMAL LOW (ref 12.0–15.0)
Hemoglobin: 9.5 g/dL — ABNORMAL LOW (ref 12.0–15.0)
MCHC: 33.7 g/dL (ref 30.0–36.0)
MCHC: 34.4 g/dL (ref 30.0–36.0)
MCHC: 34.4 g/dL (ref 30.0–36.0)
MCV: 103.7 fL — ABNORMAL HIGH (ref 78.0–100.0)
MCV: 106.2 fL — ABNORMAL HIGH (ref 78.0–100.0)
MCV: 98.6 fL (ref 78.0–100.0)
MCV: 98.6 fL (ref 78.0–100.0)
Platelets: 79 10*3/uL — ABNORMAL LOW (ref 150–400)
RBC: 2.18 MIL/uL — ABNORMAL LOW (ref 3.87–5.11)
RBC: 2.56 MIL/uL — ABNORMAL LOW (ref 3.87–5.11)
RBC: 3.06 MIL/uL — ABNORMAL LOW (ref 3.87–5.11)
RDW: 18.5 % — ABNORMAL HIGH (ref 11.5–15.5)
RDW: 19.6 % — ABNORMAL HIGH (ref 11.5–15.5)
RDW: 20.2 % — ABNORMAL HIGH (ref 11.5–15.5)
RDW: 20.6 % — ABNORMAL HIGH (ref 11.5–15.5)
WBC: 1.2 10*3/uL — CL (ref 4.0–10.5)
WBC: 4.5 10*3/uL (ref 4.0–10.5)

## 2011-02-12 LAB — CULTURE, BLOOD (ROUTINE X 2): Culture: NO GROWTH

## 2011-02-12 LAB — URINALYSIS, ROUTINE W REFLEX MICROSCOPIC
Bilirubin Urine: NEGATIVE
Glucose, UA: NEGATIVE mg/dL
Glucose, UA: NEGATIVE mg/dL
Ketones, ur: NEGATIVE mg/dL
Ketones, ur: NEGATIVE mg/dL
Nitrite: NEGATIVE
Protein, ur: NEGATIVE mg/dL
Specific Gravity, Urine: 1.015 (ref 1.005–1.030)
pH: 5.5 (ref 5.0–8.0)
pH: 6 (ref 5.0–8.0)

## 2011-02-12 LAB — COMPREHENSIVE METABOLIC PANEL
Alkaline Phosphatase: 51 U/L (ref 39–117)
BUN: 22 mg/dL (ref 6–23)
Chloride: 106 mEq/L (ref 96–112)
Glucose, Bld: 86 mg/dL (ref 70–99)
Potassium: 3.5 mEq/L (ref 3.5–5.1)
Total Bilirubin: 1 mg/dL (ref 0.3–1.2)
Total Protein: 5.3 g/dL — ABNORMAL LOW (ref 6.0–8.3)

## 2011-02-12 LAB — HEMOGLOBIN A1C
Hgb A1c MFr Bld: 4.9 % (ref 4.6–6.1)
Mean Plasma Glucose: 94 mg/dL

## 2011-02-12 LAB — PREPARE PLATELETS

## 2011-02-12 LAB — BLOOD GAS, ARTERIAL
Bicarbonate: 24.2 mEq/L — ABNORMAL HIGH (ref 20.0–24.0)
Drawn by: 307971
O2 Content: 2 L/min
O2 Saturation: 97.2 %
Patient temperature: 98.6
pH, Arterial: 7.463 — ABNORMAL HIGH (ref 7.350–7.400)

## 2011-02-12 LAB — CROSSMATCH: Antibody Screen: NEGATIVE

## 2011-02-12 LAB — HEMOGLOBIN AND HEMATOCRIT, BLOOD: Hemoglobin: 9.4 g/dL — ABNORMAL LOW (ref 12.0–15.0)

## 2011-02-12 LAB — GLUCOSE, CAPILLARY
Glucose-Capillary: 108 mg/dL — ABNORMAL HIGH (ref 70–99)
Glucose-Capillary: 109 mg/dL — ABNORMAL HIGH (ref 70–99)
Glucose-Capillary: 121 mg/dL — ABNORMAL HIGH (ref 70–99)
Glucose-Capillary: 127 mg/dL — ABNORMAL HIGH (ref 70–99)
Glucose-Capillary: 127 mg/dL — ABNORMAL HIGH (ref 70–99)
Glucose-Capillary: 159 mg/dL — ABNORMAL HIGH (ref 70–99)
Glucose-Capillary: 81 mg/dL (ref 70–99)
Glucose-Capillary: 90 mg/dL (ref 70–99)
Glucose-Capillary: 94 mg/dL (ref 70–99)

## 2011-02-12 LAB — CARDIAC PANEL(CRET KIN+CKTOT+MB+TROPI)
CK, MB: 1.6 ng/mL (ref 0.3–4.0)
Relative Index: 0.3 (ref 0.0–2.5)
Total CK: 882 U/L — ABNORMAL HIGH (ref 7–177)
Troponin I: 0.05 ng/mL (ref 0.00–0.06)

## 2011-02-12 LAB — URINE CULTURE: Colony Count: >=100000

## 2011-02-12 LAB — PREPARE RBC (CROSSMATCH)

## 2011-02-14 LAB — CROSSMATCH

## 2011-03-14 ENCOUNTER — Encounter: Payer: Medicare Other | Admitting: Internal Medicine

## 2011-03-16 ENCOUNTER — Ambulatory Visit (HOSPITAL_COMMUNITY)
Admission: RE | Admit: 2011-03-16 | Discharge: 2011-03-16 | Disposition: A | Discharge status: Home / Self Care | Payer: Medicare Other | Source: Ambulatory Visit | Attending: Internal Medicine | Admitting: Internal Medicine

## 2011-03-16 ENCOUNTER — Other Ambulatory Visit: Payer: Self-pay | Admitting: Internal Medicine

## 2011-03-16 ENCOUNTER — Encounter (HOSPITAL_BASED_OUTPATIENT_CLINIC_OR_DEPARTMENT_OTHER): Payer: Medicare Other | Admitting: Internal Medicine

## 2011-03-16 ENCOUNTER — Other Ambulatory Visit (HOSPITAL_COMMUNITY): Payer: Self-pay

## 2011-03-16 ENCOUNTER — Encounter (HOSPITAL_COMMUNITY): Payer: Self-pay

## 2011-03-16 DIAGNOSIS — R0602 Shortness of breath: Secondary | ICD-10-CM | POA: Insufficient documentation

## 2011-03-16 DIAGNOSIS — J984 Other disorders of lung: Secondary | ICD-10-CM | POA: Insufficient documentation

## 2011-03-16 DIAGNOSIS — C3490 Malignant neoplasm of unspecified part of unspecified bronchus or lung: Secondary | ICD-10-CM

## 2011-03-16 DIAGNOSIS — Z452 Encounter for adjustment and management of vascular access device: Secondary | ICD-10-CM

## 2011-03-16 DIAGNOSIS — J90 Pleural effusion, not elsewhere classified: Secondary | ICD-10-CM | POA: Insufficient documentation

## 2011-03-16 DIAGNOSIS — C3430 Malignant neoplasm of lower lobe, unspecified bronchus or lung: Secondary | ICD-10-CM

## 2011-03-16 DIAGNOSIS — I700 Atherosclerosis of aorta: Secondary | ICD-10-CM | POA: Insufficient documentation

## 2011-03-16 LAB — CMP (CANCER CENTER ONLY)
Albumin: 2.6 g/dL — ABNORMAL LOW (ref 3.3–5.5)
Alkaline Phosphatase: 92 U/L — ABNORMAL HIGH (ref 26–84)
BUN, Bld: 18 mg/dL (ref 7–22)
CO2: 27 mEq/L (ref 18–33)
Glucose, Bld: 88 mg/dL (ref 73–118)
Potassium: 4.4 mEq/L (ref 3.3–4.7)
Total Bilirubin: 0.6 mg/dl (ref 0.20–1.60)
Total Protein: 6.2 g/dL — ABNORMAL LOW (ref 6.4–8.1)

## 2011-03-16 LAB — CBC WITH DIFFERENTIAL/PLATELET
Basophils Absolute: 0 10*3/uL (ref 0.0–0.1)
Eosinophils Absolute: 0.1 10*3/uL (ref 0.0–0.5)
HGB: 11.3 g/dL — ABNORMAL LOW (ref 11.6–15.9)
LYMPH%: 13.1 % — ABNORMAL LOW (ref 14.0–49.7)
MCV: 93.4 fL (ref 79.5–101.0)
MONO#: 0.6 10*3/uL (ref 0.1–0.9)
MONO%: 11.9 % (ref 0.0–14.0)
NEUT#: 3.3 10*3/uL (ref 1.5–6.5)
Platelets: 172 10*3/uL (ref 145–400)
WBC: 4.7 10*3/uL (ref 3.9–10.3)

## 2011-03-21 ENCOUNTER — Other Ambulatory Visit: Payer: Self-pay | Admitting: Internal Medicine

## 2011-03-21 ENCOUNTER — Encounter (HOSPITAL_BASED_OUTPATIENT_CLINIC_OR_DEPARTMENT_OTHER): Payer: Medicare Other | Admitting: Internal Medicine

## 2011-03-21 DIAGNOSIS — C3490 Malignant neoplasm of unspecified part of unspecified bronchus or lung: Secondary | ICD-10-CM

## 2011-03-21 DIAGNOSIS — C3430 Malignant neoplasm of lower lobe, unspecified bronchus or lung: Secondary | ICD-10-CM

## 2011-04-11 NOTE — Letter (Signed)
March 04, 2008   Diane [name], [name], [name]   Re:  [name], [name]               DOB:  05-17-1941   Dear [name]:   [name] came today, and her chest x-ray showed no evidence of change  in her loculated right pleural effusion.  She is having no symptoms,  assessed for 94%.  Her blood pressure is 143/78, pulse 91, respirations  18.  Lungs were clear, except for decreased breath sounds in the right  base.  I will see her back again, if she has any future problems.   Nicanor Alcon, M.D.  Electronically Signed   DPB/MEDQ  D:  03/04/2008  T:  03/04/2008  Job:  [id number]

## 2011-04-11 NOTE — Assessment & Plan Note (Signed)
OFFICE VISIT   Diane [name], GEETER  DOB:  01-11-1941                                        November 04, 2008  CHART #:  [id number]   She came today and her Port-A-Cath site looks good.  It is in good  position.  On chest x-ray, it looks like it is in good position to  distal SVC.  There is no swelling or no hematoma.  Her blood pressure  was 138/83, pulse 98, respirations 18, and sats were 96%.   Nicanor Alcon, M.D.  Electronically Signed   DPB/MEDQ  D:  11/04/2008  T:  11/04/2008  Job:  [id number]

## 2011-04-11 NOTE — Op Note (Signed)
[name], Diane [name]               ACCOUNT NO.:  [id number]   MEDICAL RECORD NO.:  [id number]          PATIENT TYPE:  AMB   LOCATION:  SDS                          FACILITY:  [facility]   PHYSICIAN:  Nicanor Alcon, M.D. DATE OF BIRTH:  02-14-1941   DATE OF PROCEDURE:  11/03/2008  DATE OF DISCHARGE:  11/03/2008                               OPERATIVE REPORT   PREOPERATIVE DIAGNOSIS:  Non-small cell lung cancer.   POSTOPERATIVE DIAGNOSIS:  Non-small cell lung cancer.   OPERATION PERFORMED:  Insertion of a left IJ Port-A-Cath.   SURGEON:  Nicanor Alcon, MD   ANESTHESIA:  IV sedation and 1% Xylocaine.   DESCRIPTION OF PROCEDURE:  After prep and draping the left chest, the  area was infiltrated with 1% Xylocaine in the left infraclavicular area  and left subclavian puncture was performed, but the guidewire would not  thread in the subclavian vein, so we did a left IJ puncture and threaded  the guidewire under fluoro to the right atrium.  Over the guidewire, we  passed the dilator with peel-away sheath.  A pocket was then made for  the Port-A-Cath reservoir inferior to the clavicle medially and  dissection then down to the pectoral fascia.  The Port-A-Cath reservoir  was placed and sutured in place with 2-0 silk.  Then the tubing was  tunneled from the reservoir pocket to the stab wound around the  guidewire and the peel-away sheath.  It was then measured appropriately  to reach the distal SVC, right atrium, and cut.  Over the dilator and  the guidewire removed and the tubing passed through the peel-away  sheath.  The peel-away sheath was removed.  This was confirmed to be in  the distal SVC, right atrial junction.  It was flushed easily and  withdrew easily.  Wounds were closed with 3-0 Vicryl and Dermabond for  the skin.  The patient was returned to the recovery room in stable  condition.      Nicanor Alcon, M.D.  Electronically Signed     DPB/MEDQ  D:  11/03/2008  T:   11/03/2008  Job:  [id number]

## 2011-04-11 NOTE — Assessment & Plan Note (Signed)
OFFICE VISIT   Diane [name], Diane [name]  DOB:  May 22, 1941                                        January 29, 2008  CHART #:  [id number]   HISTORY:  Ms. Kasai came in today.  She has had no more drainage from  her Pleurx.  The CT scan done on Thursday shows that there is no  evidence of recurrent effusion and she has a loculated basilar effusion.   PLAN:  I plan to remove the Pleurx and insert talc on Monday, February 03, 2008, at [facility].   Nicanor Alcon, M.D.  Electronically Signed   DPB/MEDQ  D:  01/29/2008  T:  01/29/2008  Job:  [id number]

## 2011-04-11 NOTE — Op Note (Signed)
NAMESHAMILLE, BARNGROVER               ACCOUNT NO.:  [id number]   MEDICAL RECORD NO.:  [id number]          PATIENT TYPE:  AMB   LOCATION:  SDS                          FACILITY:  [facility]   PHYSICIAN:  Nicanor Alcon, M.D. DATE OF BIRTH:  October 17, 1941   DATE OF PROCEDURE:  02/03/2008  DATE OF DISCHARGE:  02/03/2008                               OPERATIVE REPORT   PREOPERATIVE DIAGNOSIS:  Stage IIIB non-small cell lung cancer with  right pleural malignant pleural effusion, status post insertion of  PleurX catheter.   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   OPERATION PERFORMED:  Removal of PleurX catheter and insertion of 2  grams talc.   After local anesthesia with Xylocaine the right PleurX catheter was  prepped and draped in usual sterile manner.  We attached the drainage  device to it and then removed approximately 15 mL of fluid and then  through the same drainage device, inserted 2 grams of talc.  She  tolerated that well.  We then, under sterile conditions, dissected up  the cuff and then performed the PleurX  and sutured the exit site with  two 3-0 nylon.  The dressing was applied.  The patient tolerated well.      Nicanor Alcon, M.D.  Electronically Signed     DPB/MEDQ  D:  02/03/2008  T:  02/04/2008  Job:  [id number]

## 2011-04-11 NOTE — Letter (Signed)
January 08, 2008   Diane [name]. [name], [address], [place] [address]   Re:  Diane, [name]               DOB:  February 13, 1941   Dear [name]:   I saw the patient in the office today.  Her blood pressure is 132/76,  pulse 81, respirations 18, sats were 92%.  CT scan of the brain showed  no evidence of mets.  Her chest x-ray today still showed a right pleural  effusion, but it is improved from what it was previously.  She is  draining in between 250 and 350 per every 2 days.  This is just a little  too much to remove her Pleurx, so we will continue drainage for another  2 weeks, and then see her back at that time with a chest x-ray.   Nicanor Alcon, M.D.  Electronically Signed   DPB/MEDQ  D:  01/08/2008  T:  01/09/2008  Job:  [id number]

## 2011-04-11 NOTE — Letter (Signed)
December 25, 2007   Diane [name], [place] [address]   Re:  [name], [name]               DOB:  1941-06-27   Dear Dr. [name],   Diane [name] has been draining her PleurX Monday-Wednesday-Friday and is  draining about 250-350 per shift.  On Monday when she was unconscious  she became incontinent.  She has had no episodes since then.  We drained  250 mL today.   Her chest x-ray showed a stable right effusion, and no other changes.  I  will continue draining her 3x a week for the next two weeks and see her  back, but because of her seizures, I am going to order a CT scan of the  brain.  She does not tolerate MRIs well and we will give her a report on  this.  She will be seeing me on the 10th.   Nicanor Alcon, M.D.  Electronically Signed   DPB/MEDQ  D:  12/25/2007  T:  12/25/2007  Job:  [id number]

## 2011-04-11 NOTE — Letter (Signed)
February 12, 2008   Eilleen Kempf, MD  [place],  [address]   Dear [name]:   Re:  Diane [name], Diane [name]               DOB:  February 18, 1941   I saw the patient back today and removed the sutures before we removed  her Port-A-Cath.  Her chest x-ray still shows that loculated effusion  inferiorly but no change and she is having no shortness of breath and  really looks good.  We plan to see her back again in 3 weeks with a  chest x-ray.   Nicanor Alcon, M.D.  Electronically Signed   DPB/MEDQ  D:  02/12/2008  T:  02/12/2008  Job:  [id number]

## 2011-04-11 NOTE — Op Note (Signed)
NAMEJAE, GAMBY               ACCOUNT NO.:  [id number]   MEDICAL RECORD NO.:  [id number]          PATIENT TYPE:  AMB   LOCATION:  SDS                          FACILITY:  [facility]   PHYSICIAN:  Nicanor Alcon, M.D. 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   POSTOPERATIVE DIAGNOSIS:  Malignant right pleural effusion secondary to  non-small cell lung cancer.   OPERATION PERFORMED:  Right insertion of Pleurx catheter.   SURGEON:  Dr. Ruel Favors. [facility]   ANESTHESIA:  1% Xylocaine anesthesia and IV sedation.   DESCRIPTION OF PROCEDURE:  After the patient was placed and right  lateral thoracotomy position was prepped and draped in the usual sterile  manner.  An area was infiltrated with 1% Xylocaine at the posterior  axillary line at the 7th intercostal space and at the anterior axillary  line at the 7th intercostal space.  A needle was inserted into the right  chest with a sheath and the sheath left in place.  The sheath was passed  to the guidewire under fluoro guidance.  This to confirm was in the  right chest.  A stab wound was made around the guidewire after the  sheath was removed.  Another stab wound was made anteriorly for the  previous and Xylocaine had been inserted.  From the anterior stab wound  to the posterior stab was tunneled a Pleurx catheter.  The Pleurx  catheter was tunneled to the sheath.  The cuff was in the space and the  subcutaneous tissue over the guidewire was passed a dilator with peel-  away sheath.  The dilator and guidewire were removed and through the  peel-away sheath was passed the Pleurx catheter.  The peel-away sheath  was removed.  The catheter was sutured in place with 3-0 nylon.  We then  drained 1800 mL of serous fluid.  A dry sterile dressing was applied and  the patient was returned to the Recovery Room in stable condition.      Nicanor Alcon, M.D.  Electronically Signed     DPB/MEDQ  D:  12/02/2007  T:  12/02/2007  Job:  [id number]   cc:   Gaston Islam. Tressie Stalker, MD

## 2011-04-11 NOTE — Assessment & Plan Note (Signed)
OFFICE VISIT   [name], [name]  DOB:  1941-08-10                                        December 10, 2007  CHART #:  [id number]   Diane [name] returned today for followup of her Pleurx catheter.  She drained  500 and 800 in the last two days.  We drained 250 today, so she is  draining about 500 every other day.  Will continue.  Her blood pressure  is 130/74.  Pulse:  88.  Respirations:  18.  Saturations were 98%.  We  checked on her and removed her stitches from her entrance site.  She is  having some pain with the drainage.  We will continue on Monday,  Wednesday, Friday drainage.  We will see her back again in two weeks  with a chest x-ray.   Nicanor Alcon, M.D.  Electronically Signed   DPB/MEDQ  D:  12/10/2007  T:  12/10/2007  Job:  [id number]

## 2011-04-11 NOTE — Letter (Signed)
November 26, 2007   Eilleen Kempf, [address], [place] [address]   Re:  Diane [name], Diane [name]               DOB:  May 10, 1941   Dear [name]:   I saw Ms. Consoli back in the office today and she has developed a right  pleural effusion that has become fairly symptomatic.  She has had two  thoracenteses.  She is back on chemotherapy.  I have arranged to do a  right Pleurx catheter on 12/02/07 at [facility].  Hopefully,  she can  keep in this three to six weeks.  I appreciate the opportunity to see  Ms. Ellerby.   Sincerely,   Nicanor Alcon, M.D.  Electronically Signed   DPB/MEDQ  D:  11/26/2007  T:  11/26/2007  Job:  [id number]

## 2011-04-11 NOTE — Letter (Signed)
January 22, 2008   Eilleen Kempf, MD  [facility] [address]  [place],   [address]   Re:  CHAUNICE, IERARDI               DOB:  1941/05/25   Dear Dr. [name]:   Ms. Minner's blood pressure was 149/87, pulse 100, respirations 18,  temperature 98.4.  She returned today for followup on her Pleurex  catheter.  We drained 450 mL today.  She had drained very little before.  Her chest x-ray still showed loculated effusion posteriorly but no major  change.  She is getting a CT scan tomorrow.  Because of this I will plan  to see her back again in one week to make a decision whether we can  discontinue her Pleurex catheter or that we should put talc into it.   Nicanor Alcon, M.D.  Electronically Signed   DPB/MEDQ  D:  01/22/2008  T:  01/23/2008  Job:  [id number]   cc:   Claris Gower, M.D.

## 2011-04-14 NOTE — Op Note (Signed)
Diane [name], Diane [name]               ACCOUNT NO.:  [id number]   MEDICAL RECORD NO.:  [id number]          PATIENT TYPE:  AMB   LOCATION:  SDS                          FACILITY:  [facility]   PHYSICIAN:  Nicanor Alcon, M.D. DATE OF BIRTH:  November 13, 1941   DATE OF PROCEDURE:  02/19/2007  DATE OF DISCHARGE:                               OPERATIVE REPORT   PREOPERATIVE DIAGNOSIS:  Right lower lobe lesion.   POSTOPERATIVE DIAGNOSIS:  Right lower lobe lesion.   OPERATION PERFORMED:  Fiberoptic bronchoscopy with mediastinoscopy.   ATTENDING:  Nicanor Alcon, M.D.   DESCRIPTION OF PROCEDURE:  After general anesthesia, the fiberoptic  bronchoscope was passed through the endotracheal tube.  The carina was  in the midline.  The left mainstem, left upper lobe and left lower lobe  orifices were normal.  The right main stem and right upper lobe orifices  were normal.  The right middle lobe orifice was normal.  On the superior  segment of the right lower lobe, there appeared to be some areas of  bleeding and irregularity.  No definite tumor could be seen.  Brushings  and transbronchial biopsies were done under fluoroscopic guidance in  this area.  The video bronchoscope was removed.  The anterior neck was  prepped and draped in the usual sterile manner.  A transverse incision  was made and this was carried down with electrocautery through  subcutaneous tissue and fascia.  The pretracheal fascia was entered and  biopsies of two 4R and one 2R nodes were done.  Strap muscles were  closed with 2-0 Vicryl, subcutaneous tissues with 3-0 Vicryl and  Dermabond for the skin.  The patient returned to the recovery room in  stable condition.      Nicanor Alcon, M.D.  Electronically Signed     DPB/MEDQ  D:  02/19/2007  T:  02/19/2007  Job:  [id number]

## 2011-04-25 ENCOUNTER — Encounter (HOSPITAL_BASED_OUTPATIENT_CLINIC_OR_DEPARTMENT_OTHER): Payer: Medicare Other | Admitting: Internal Medicine

## 2011-04-25 DIAGNOSIS — Z452 Encounter for adjustment and management of vascular access device: Secondary | ICD-10-CM

## 2011-04-25 DIAGNOSIS — C3430 Malignant neoplasm of lower lobe, unspecified bronchus or lung: Secondary | ICD-10-CM

## 2011-06-06 ENCOUNTER — Encounter (HOSPITAL_BASED_OUTPATIENT_CLINIC_OR_DEPARTMENT_OTHER): Payer: Medicare Other | Admitting: Internal Medicine

## 2011-06-06 DIAGNOSIS — Z452 Encounter for adjustment and management of vascular access device: Secondary | ICD-10-CM

## 2011-06-06 DIAGNOSIS — C3430 Malignant neoplasm of lower lobe, unspecified bronchus or lung: Secondary | ICD-10-CM

## 2011-06-14 ENCOUNTER — Other Ambulatory Visit: Payer: Self-pay | Admitting: Internal Medicine

## 2011-06-14 ENCOUNTER — Encounter (HOSPITAL_BASED_OUTPATIENT_CLINIC_OR_DEPARTMENT_OTHER): Payer: Medicare Other | Admitting: Internal Medicine

## 2011-06-14 DIAGNOSIS — Z452 Encounter for adjustment and management of vascular access device: Secondary | ICD-10-CM

## 2011-06-14 DIAGNOSIS — C3430 Malignant neoplasm of lower lobe, unspecified bronchus or lung: Secondary | ICD-10-CM

## 2011-06-14 LAB — CBC WITH DIFFERENTIAL/PLATELET
Basophils Absolute: 0 10*3/uL (ref 0.0–0.1)
Eosinophils Absolute: 0.1 10*3/uL (ref 0.0–0.5)
HGB: 11.7 g/dL (ref 11.6–15.9)
MCV: 92.2 fL (ref 79.5–101.0)
MONO#: 0.5 10*3/uL (ref 0.1–0.9)
MONO%: 11.4 % (ref 0.0–14.0)
NEUT#: 3 10*3/uL (ref 1.5–6.5)
RBC: 3.67 10*6/uL — ABNORMAL LOW (ref 3.70–5.45)
RDW: 14 % (ref 11.2–14.5)
WBC: 4.3 10*3/uL (ref 3.9–10.3)

## 2011-06-14 LAB — COMPREHENSIVE METABOLIC PANEL
Albumin: 3.5 g/dL (ref 3.5–5.2)
Alkaline Phosphatase: 88 U/L (ref 39–117)
BUN: 24 mg/dL — ABNORMAL HIGH (ref 6–23)
CO2: 27 mEq/L (ref 19–32)
Calcium: 9.2 mg/dL (ref 8.4–10.5)
Chloride: 104 mEq/L (ref 96–112)
Glucose, Bld: 93 mg/dL (ref 70–99)
Potassium: 4.5 mEq/L (ref 3.5–5.3)
Sodium: 138 mEq/L (ref 135–145)
Total Protein: 6.2 g/dL (ref 6.0–8.3)

## 2011-06-15 ENCOUNTER — Encounter (HOSPITAL_COMMUNITY): Payer: Self-pay

## 2011-06-15 ENCOUNTER — Ambulatory Visit (HOSPITAL_COMMUNITY)
Admission: RE | Admit: 2011-06-15 | Discharge: 2011-06-15 | Disposition: A | Discharge status: Home / Self Care | Payer: Medicare Other | Source: Ambulatory Visit | Attending: Internal Medicine | Admitting: Internal Medicine

## 2011-06-15 DIAGNOSIS — I708 Atherosclerosis of other arteries: Secondary | ICD-10-CM | POA: Insufficient documentation

## 2011-06-15 DIAGNOSIS — J984 Other disorders of lung: Secondary | ICD-10-CM | POA: Insufficient documentation

## 2011-06-15 DIAGNOSIS — I2510 Atherosclerotic heart disease of native coronary artery without angina pectoris: Secondary | ICD-10-CM | POA: Insufficient documentation

## 2011-06-15 DIAGNOSIS — J90 Pleural effusion, not elsewhere classified: Secondary | ICD-10-CM | POA: Insufficient documentation

## 2011-06-15 DIAGNOSIS — C3490 Malignant neoplasm of unspecified part of unspecified bronchus or lung: Secondary | ICD-10-CM | POA: Insufficient documentation

## 2011-06-15 DIAGNOSIS — I700 Atherosclerosis of aorta: Secondary | ICD-10-CM | POA: Insufficient documentation

## 2011-06-20 ENCOUNTER — Encounter (HOSPITAL_BASED_OUTPATIENT_CLINIC_OR_DEPARTMENT_OTHER): Payer: Medicare Other | Admitting: Internal Medicine

## 2011-06-20 ENCOUNTER — Other Ambulatory Visit: Payer: Self-pay | Admitting: Internal Medicine

## 2011-06-20 DIAGNOSIS — C3490 Malignant neoplasm of unspecified part of unspecified bronchus or lung: Secondary | ICD-10-CM

## 2011-06-20 DIAGNOSIS — C3430 Malignant neoplasm of lower lobe, unspecified bronchus or lung: Secondary | ICD-10-CM

## 2011-07-18 ENCOUNTER — Encounter (HOSPITAL_BASED_OUTPATIENT_CLINIC_OR_DEPARTMENT_OTHER): Payer: Medicare Other | Admitting: Internal Medicine

## 2011-07-18 DIAGNOSIS — Z452 Encounter for adjustment and management of vascular access device: Secondary | ICD-10-CM

## 2011-07-18 DIAGNOSIS — C3430 Malignant neoplasm of lower lobe, unspecified bronchus or lung: Secondary | ICD-10-CM

## 2011-08-17 LAB — BODY FLUID CULTURE
Culture: NO GROWTH
Gram Stain: NONE SEEN

## 2011-08-17 LAB — AFB CULTURE WITH SMEAR (NOT AT ARMC)

## 2011-08-17 LAB — FUNGUS CULTURE W SMEAR

## 2011-08-28 LAB — DIFFERENTIAL
Basophils Absolute: 0
Eosinophils Relative: 0
Lymphocytes Relative: 6 — ABNORMAL LOW
Neutro Abs: 4.7
Neutrophils Relative %: 74

## 2011-08-28 LAB — CBC
HCT: 33.7 — ABNORMAL LOW
Platelets: 201
RDW: 15.6 — ABNORMAL HIGH
WBC: 6.4

## 2011-08-28 LAB — BASIC METABOLIC PANEL
BUN: 11
Calcium: 9.7
Creatinine, Ser: 0.93
GFR calc non Af Amer: 60 — ABNORMAL LOW
Glucose, Bld: 124 — ABNORMAL HIGH

## 2011-08-28 LAB — POCT CARDIAC MARKERS
CKMB, poc: <1 — ABNORMAL LOW
Myoglobin, poc: 77.6

## 2011-08-29 ENCOUNTER — Encounter (HOSPITAL_BASED_OUTPATIENT_CLINIC_OR_DEPARTMENT_OTHER): Payer: Medicare Other | Admitting: Internal Medicine

## 2011-08-29 DIAGNOSIS — Z452 Encounter for adjustment and management of vascular access device: Secondary | ICD-10-CM

## 2011-08-29 DIAGNOSIS — C3430 Malignant neoplasm of lower lobe, unspecified bronchus or lung: Secondary | ICD-10-CM

## 2011-09-01 LAB — COMPREHENSIVE METABOLIC PANEL
ALT: 26
AST: 22
Albumin: 3.5
Calcium: 10.4
GFR calc Af Amer: >60
Sodium: 136
Total Protein: 6.1

## 2011-09-01 LAB — PROTIME-INR
INR: 1 (ref 0.00–1.49)
Prothrombin Time: 13.8 seconds (ref 11.6–15.2)

## 2011-09-01 LAB — BASIC METABOLIC PANEL
CO2: 27 mEq/L (ref 19–32)
Calcium: 9.6 mg/dL (ref 8.4–10.5)
Creatinine, Ser: 0.8 mg/dL (ref 0.4–1.2)
GFR calc Af Amer: >60 mL/min (ref >60)
GFR calc non Af Amer: >60 mL/min (ref >60)

## 2011-09-01 LAB — CBC
MCHC: 34 g/dL (ref 30.0–36.0)
MCHC: 34.2
RBC: 3.97 MIL/uL (ref 3.87–5.11)
RDW: 18.1 % — ABNORMAL HIGH (ref 11.5–15.5)
RDW: 16.9 — ABNORMAL HIGH

## 2011-10-07 ENCOUNTER — Encounter: Payer: Self-pay | Admitting: *Deleted

## 2011-10-11 ENCOUNTER — Encounter: Payer: Self-pay | Admitting: Thoracic Surgery

## 2011-10-12 ENCOUNTER — Ambulatory Visit (HOSPITAL_COMMUNITY)
Admission: RE | Admit: 2011-10-12 | Discharge: 2011-10-12 | Disposition: A | Discharge status: Home / Self Care | Payer: Medicare Other | Source: Ambulatory Visit | Attending: Internal Medicine | Admitting: Internal Medicine

## 2011-10-12 ENCOUNTER — Ambulatory Visit (HOSPITAL_BASED_OUTPATIENT_CLINIC_OR_DEPARTMENT_OTHER): Payer: Medicare Other

## 2011-10-12 ENCOUNTER — Other Ambulatory Visit (HOSPITAL_BASED_OUTPATIENT_CLINIC_OR_DEPARTMENT_OTHER): Payer: Medicare Other | Admitting: Lab

## 2011-10-12 ENCOUNTER — Other Ambulatory Visit: Payer: Self-pay | Admitting: Internal Medicine

## 2011-10-12 DIAGNOSIS — Z923 Personal history of irradiation: Secondary | ICD-10-CM | POA: Insufficient documentation

## 2011-10-12 DIAGNOSIS — C3490 Malignant neoplasm of unspecified part of unspecified bronchus or lung: Secondary | ICD-10-CM | POA: Insufficient documentation

## 2011-10-12 DIAGNOSIS — J90 Pleural effusion, not elsewhere classified: Secondary | ICD-10-CM | POA: Insufficient documentation

## 2011-10-12 DIAGNOSIS — I700 Atherosclerosis of aorta: Secondary | ICD-10-CM | POA: Insufficient documentation

## 2011-10-12 DIAGNOSIS — Z9221 Personal history of antineoplastic chemotherapy: Secondary | ICD-10-CM | POA: Insufficient documentation

## 2011-10-12 DIAGNOSIS — C3430 Malignant neoplasm of lower lobe, unspecified bronchus or lung: Secondary | ICD-10-CM

## 2011-10-12 DIAGNOSIS — Z452 Encounter for adjustment and management of vascular access device: Secondary | ICD-10-CM

## 2011-10-12 DIAGNOSIS — Z8589 Personal history of malignant neoplasm of other organs and systems: Secondary | ICD-10-CM | POA: Insufficient documentation

## 2011-10-12 DIAGNOSIS — R0602 Shortness of breath: Secondary | ICD-10-CM | POA: Insufficient documentation

## 2011-10-12 LAB — CBC WITH DIFFERENTIAL/PLATELET
Eosinophils Absolute: 0.2 10*3/uL (ref 0.0–0.5)
HCT: 36 % (ref 34.8–46.6)
LYMPH%: 13.1 % — ABNORMAL LOW (ref 14.0–49.7)
MONO#: 0.5 10*3/uL (ref 0.1–0.9)
NEUT#: 3.7 10*3/uL (ref 1.5–6.5)
NEUT%: 72.2 % (ref 38.4–76.8)
Platelets: 143 10*3/uL — ABNORMAL LOW (ref 145–400)
RBC: 3.86 10*6/uL (ref 3.70–5.45)
WBC: 5.1 10*3/uL (ref 3.9–10.3)
nRBC: 0 % (ref 0–0)

## 2011-10-12 LAB — COMPREHENSIVE METABOLIC PANEL
ALT: 11 U/L (ref 0–35)
Albumin: 3 g/dL — ABNORMAL LOW (ref 3.5–5.2)
CO2: 27 mEq/L (ref 19–32)
Calcium: 9.6 mg/dL (ref 8.4–10.5)
Chloride: 105 mEq/L (ref 96–112)
Creatinine, Ser: 1.56 mg/dL — ABNORMAL HIGH (ref 0.50–1.10)

## 2011-10-12 MED ORDER — SODIUM CHLORIDE 0.9 % IJ SOLN
10.0000 mL | Freq: Once | INTRAMUSCULAR | Status: AC
  Administered 2011-10-12: 10 mL via INTRAVENOUS
  Filled 2011-10-12: qty 10

## 2011-10-12 MED ORDER — HEPARIN SOD (PORK) LOCK FLUSH 100 UNIT/ML IV SOLN
500.0000 [IU] | Freq: Once | INTRAVENOUS | Status: AC
  Administered 2011-10-12: 500 [IU] via INTRAVENOUS
  Filled 2011-10-12: qty 5

## 2011-10-12 NOTE — Progress Notes (Signed)
Addended by: Gracy Bruins on: 10/12/2011 03:51 PM   Modules accepted: Miquel Dunn

## 2011-10-14 ENCOUNTER — Encounter: Payer: Self-pay | Admitting: *Deleted

## 2011-10-17 ENCOUNTER — Ambulatory Visit (HOSPITAL_BASED_OUTPATIENT_CLINIC_OR_DEPARTMENT_OTHER): Payer: Medicare Other | Admitting: Internal Medicine

## 2011-10-17 VITALS — BP 127/66 | HR 93 | Temp 96.9°F | Ht 66.0 in | Wt 249.5 lb

## 2011-10-17 DIAGNOSIS — C3491 Malignant neoplasm of unspecified part of right bronchus or lung: Secondary | ICD-10-CM | POA: Insufficient documentation

## 2011-10-17 DIAGNOSIS — C3490 Malignant neoplasm of unspecified part of unspecified bronchus or lung: Secondary | ICD-10-CM

## 2011-10-17 DIAGNOSIS — C3430 Malignant neoplasm of lower lobe, unspecified bronchus or lung: Secondary | ICD-10-CM

## 2011-10-17 NOTE — Progress Notes (Signed)
[facility] OFFICE PROGRESS NOTE  DIAGNOSIS: Recurrent non-small cell lung cancer, adenocarcinoma initially diagnosed as stage IIA in March 2008.  PRIOR THERAPY:  1. Status post concurrent chemoradiation with weekly carboplatin and paclitaxel.  Last dose was given Apr 12, 2007. 2. Status post 3 cycles of consolidation chemotherapy with docetaxel.  Last dose was given July 12, 2007. 3. 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 4. Status post 39 cycles of maintenance Alimta at a dose of 500 mg/m2.  Last dose was given February 25, 2010, discontinued secondary to persistent anemia, but the patient has stable disease.  CURRENT THERAPY: Observation.  INTERVAL HISTORY: Diane [name] 70 y.o. female returns to the clinic today for her routine 4 month followup visit. The patient has no complaints today except for mild chest congestion secondary to allergy. She denied having any significant chest pain or shortness of breath. She has no cough or hemoptysis. No significant weight loss. The patient has repeat CT scan of the chest, abdomen and pelvis performed on 10/12/2011. She is here today for evaluation and discussion of her scan results.  MEDICAL HISTORY: Past Medical History  Diagnosis Date  . Cancer     lung ca  . Hypertension     ALLERGIES:   has no known allergies.  MEDICATIONS:  Current Outpatient Prescriptions  Medication Sig Dispense Refill  . acetaminophen (TYLENOL) 325 MG tablet Take 650 mg by mouth every 6 (six) hours as needed.        . diphenhydrAMINE (BENADRYL) 25 MG tablet Take 25 mg by mouth every 6 (six) hours as needed. For allergy relief       . aspirin 81 MG tablet Take 81 mg by mouth daily.        Marland Kitchen labetalol (NORMODYNE) 200 MG tablet Take 200 mg by mouth 2 (two) times daily.        Marland Kitchen torsemide (DEMADEX) 20 MG tablet Take 20 mg by mouth daily.          SURGICAL HISTORY:  Past Surgical History   Procedure Date  . Portacath placement 11/03/08-[facility]    tip in mid SVC-E. [facility]    REVIEW OF SYSTEMS:  A comprehensive review of systems was negative.   PHYSICAL EXAMINATION: General appearance: alert, cooperative and no distress Head: Normocephalic, without obvious abnormality, atraumatic Neck: no adenopathy Lymph nodes: Cervical, supraclavicular, and axillary nodes normal. Resp: clear to auscultation bilaterally Cardio: regular rate and rhythm, S1, S2 normal, no murmur, click, rub or gallop GI: soft, non-tender; bowel sounds normal; no masses,  no organomegaly Extremities: extremities normal, atraumatic, no cyanosis or edema Neurologic: Alert and oriented X 3, normal strength and tone. Normal symmetric reflexes. Normal coordination and gait  ECOG PERFORMANCE STATUS: 1 - Symptomatic but completely ambulatory  Blood pressure 127/66, pulse 93, temperature 96.9 F (36.1 C), height 5\' 6"  (1.676 m), weight 249 lb 8 oz (113.172 kg).  LABORATORY DATA: Lab Results  Component Value Date   WBC 5.1 10/12/2011   HGB 11.8 10/12/2011   HCT 36.0 10/12/2011   MCV 93.3 10/12/2011   PLT 143* 10/12/2011      Chemistry      Component Value Date/Time   NA 139 10/12/2011 0803   NA 137 03/16/2011 0742   K 4.2 10/12/2011 0803   K 4.4 03/16/2011 0742   CL 105 10/12/2011 0803   CL 106 03/16/2011 0742   CO2 27 10/12/2011 0803   CO2  27 03/16/2011 0742   BUN 25* 10/12/2011 0803   BUN 18 03/16/2011 0742   CREATININE 1.56* 10/12/2011 0803   CREATININE 1.4* 03/16/2011 0742      Component Value Date/Time   CALCIUM 9.6 10/12/2011 0803   CALCIUM 9.6 03/16/2011 0742   ALKPHOS 92 10/12/2011 0803   ALKPHOS 92* 03/16/2011 0742   AST 15 10/12/2011 0803   AST 18 03/16/2011 0742   ALT 11 10/12/2011 0803   BILITOT 0.4 10/12/2011 0803   BILITOT 0.60 03/16/2011 0742       RADIOGRAPHIC STUDIES: Ct Abdomen Pelvis Wo Contrast  10/12/2011  *RADIOLOGY REPORT*  Clinical Data:  Lung cancer, completed  chemotherapy and radiation therapy.  Shortness of breath with exertion.  History of left parotid gland cancer treated surgically.  CT CHEST, ABDOMEN AND PELVIS WITHOUT CONTRAST  Technique:  Multidetector CT imaging of the chest, abdomen and pelvis was performed following the standard protocol without IV contrast.  Comparison:  06/15/2011 similar prior exam at [facility]  CT CHEST  Findings:  Unenhanced CT was performed per clinician order.  Lack of IV contrast limits sensitivity and specificity, especially for evaluation of abdominal/pelvic solid viscera.  Left sided Port-A-Cath tip terminates in the mid SVC.  Coronary arterial calcification or stent noted.  Heart size is normal.  No pericardial effusion.  Stable soft tissue density is noted in the region of the right hilum and extending into the right lower lobe with a linear configuration, but without measurable new mass lesion.  This most likely correspond to radiation port changes.  No new lymphadenopathy.  Partly loculated small right pleural effusion measuring fluid density is stable.  Superior segment right lower lobe subpleural triangular shape nodular density, likely atelectasis or scarring, is stable.  Stable right upper lobe ground- glass opacity 8 mm nodule again identified image 23.  A few less confluent nodular areas of right upper lobe nodular opacity, for example images 15 and 18, are stable.  No new pulmonary nodule or mass lesion. Stable mild soft tissue prominence at the GE junction.  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  Stable pulmonary nodules as above.  Persistent apparent mild wall thickening at the GE junction.  If this has not been evaluated, consider endoscopy or barium esophagram for further evaluation.  CT ABDOMEN AND PELVIS  Findings:  Mild motion artifact is noted at the upper abdomen. Liver, gallbladder,  kidneys, spleen, atrophic pancreas, and adrenal glands are normal allowing for motion artifact and lack of IV contrast.  Atherosclerotic aortic calcification without aneurysm. Appendix, colon, and small bowel are normal.  No ascites or lymphadenopathy.  Uterus and ovaries are normal.  Mild presacral edema again noted.  No lytic or sclerotic osseous lesion or acute bony finding.  IMPRESSION: No acute intra-abdominal or pelvic pathology or evidence for intra- abdominal/pelvic metastatic disease.  Original Report Authenticated By: Arline Asp, M.D.   Ct Chest Wo Contrast  10/12/2011  *RADIOLOGY REPORT*  Clinical Data:  Lung cancer, completed chemotherapy and radiation therapy.  Shortness of breath with exertion.  History of left parotid gland cancer treated surgically.  CT CHEST, ABDOMEN AND PELVIS WITHOUT CONTRAST  Technique:  Multidetector CT imaging of the chest, abdomen and pelvis was performed following the standard protocol without IV contrast.  Comparison:  06/15/2011 similar prior exam at  [facility]  CT CHEST  Findings:  Unenhanced CT was performed per clinician order.  Lack of IV contrast limits sensitivity and specificity, especially for evaluation of abdominal/pelvic solid viscera.  Left sided Port-A-Cath tip terminates in the mid SVC.  Coronary arterial calcification or stent noted.  Heart size is normal.  No pericardial effusion.  Stable soft tissue density is noted in the region of the right hilum and extending into the right lower lobe with a linear configuration, but without measurable new mass lesion.  This most likely correspond to radiation port changes.  No new lymphadenopathy.  Partly loculated small right pleural effusion measuring fluid density is stable.  Superior segment right lower lobe subpleural triangular shape nodular density, likely atelectasis or scarring, is stable.  Stable right upper lobe ground- glass opacity 8 mm nodule again identified image 23.  A few less  confluent nodular areas of right upper lobe nodular opacity, for example images 15 and 18, are stable.  No new pulmonary nodule or mass lesion. Stable mild soft tissue prominence at the GE junction.  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  Stable pulmonary nodules as above.  Persistent apparent mild wall thickening at the GE junction.  If this has not been evaluated, consider endoscopy or barium esophagram for further evaluation.  CT ABDOMEN AND PELVIS  Findings:  Mild motion artifact is noted at the upper abdomen. Liver, gallbladder, kidneys, spleen, atrophic pancreas, and adrenal glands are normal allowing for motion artifact and lack of IV contrast.  Atherosclerotic aortic calcification without aneurysm. Appendix, colon, and small bowel are normal.  No ascites or lymphadenopathy.  Uterus and ovaries are normal.  Mild presacral edema again noted.  No lytic or sclerotic osseous lesion or acute bony finding.  IMPRESSION: No acute intra-abdominal or pelvic pathology or evidence for intra- abdominal/pelvic metastatic disease.  Original Report Authenticated By: Arline Asp, M.D.    ASSESSMENT: This is a pleasant 70 years old white female with recurrent non-small cell lung cancer, adenocarcinoma, status post chemotherapy treatments in the past. The patient is doing fine and she has been on observation since April 2011. She has no evidence for disease progression. I discussed the scan results with the patient.  PLAN: I recommend for him continuous observation for now. She would come back for followup visit in 6 months with repeat CT scan of the chest, abdomen and pelvis without contrast.  All questions were answered. The patient knows to call the clinic with any problems, questions or concerns. We can certainly see the patient much sooner if necessary.

## 2011-11-23 ENCOUNTER — Ambulatory Visit (HOSPITAL_BASED_OUTPATIENT_CLINIC_OR_DEPARTMENT_OTHER): Payer: Medicare Other

## 2011-11-23 DIAGNOSIS — C3490 Malignant neoplasm of unspecified part of unspecified bronchus or lung: Secondary | ICD-10-CM

## 2011-11-23 DIAGNOSIS — Z469 Encounter for fitting and adjustment of unspecified device: Secondary | ICD-10-CM

## 2011-11-23 MED ORDER — HEPARIN SOD (PORK) LOCK FLUSH 100 UNIT/ML IV SOLN
500.0000 [IU] | Freq: Once | INTRAVENOUS | Status: AC
  Administered 2011-11-23: 500 [IU] via INTRAVENOUS
  Filled 2011-11-23: qty 5

## 2011-11-23 MED ORDER — SODIUM CHLORIDE 0.9 % IJ SOLN
10.0000 mL | INTRAMUSCULAR | Status: DC | PRN
  Administered 2011-11-23: 10 mL via INTRAVENOUS
  Filled 2011-11-23: qty 10

## 2012-01-04 ENCOUNTER — Ambulatory Visit (HOSPITAL_BASED_OUTPATIENT_CLINIC_OR_DEPARTMENT_OTHER): Payer: Medicare Other

## 2012-01-04 DIAGNOSIS — Z469 Encounter for fitting and adjustment of unspecified device: Secondary | ICD-10-CM

## 2012-01-04 DIAGNOSIS — C3490 Malignant neoplasm of unspecified part of unspecified bronchus or lung: Secondary | ICD-10-CM

## 2012-01-04 MED ORDER — HEPARIN SOD (PORK) LOCK FLUSH 100 UNIT/ML IV SOLN
500.0000 [IU] | Freq: Once | INTRAVENOUS | Status: AC
  Administered 2012-01-04: 500 [IU] via INTRAVENOUS
  Filled 2012-01-04: qty 5

## 2012-01-04 MED ORDER — SODIUM CHLORIDE 0.9 % IJ SOLN
10.0000 mL | INTRAMUSCULAR | Status: DC | PRN
  Administered 2012-01-04: 10 mL via INTRAVENOUS
  Filled 2012-01-04: qty 10

## 2012-02-15 ENCOUNTER — Ambulatory Visit (HOSPITAL_BASED_OUTPATIENT_CLINIC_OR_DEPARTMENT_OTHER): Payer: Medicare Other

## 2012-02-15 VITALS — BP 103/56 | HR 91 | Temp 97.0°F

## 2012-02-15 DIAGNOSIS — D491 Neoplasm of unspecified behavior of respiratory system: Secondary | ICD-10-CM

## 2012-02-15 DIAGNOSIS — Z469 Encounter for fitting and adjustment of unspecified device: Secondary | ICD-10-CM

## 2012-02-15 MED ORDER — HEPARIN SOD (PORK) LOCK FLUSH 100 UNIT/ML IV SOLN
250.0000 [IU] | INTRAVENOUS | Status: DC | PRN
  Filled 2012-02-15: qty 5

## 2012-02-15 MED ORDER — SODIUM CHLORIDE 0.9 % IJ SOLN
10.0000 mL | INTRAMUSCULAR | Status: DC | PRN
  Filled 2012-02-15: qty 10

## 2012-02-15 MED ORDER — SODIUM CHLORIDE 0.9 % IJ SOLN
10.0000 mL | INTRAMUSCULAR | Status: AC | PRN
  Administered 2012-02-15: 10 mL
  Filled 2012-02-15: qty 10

## 2012-02-15 MED ORDER — HEPARIN SOD (PORK) LOCK FLUSH 100 UNIT/ML IV SOLN
500.0000 [IU] | INTRAVENOUS | Status: AC | PRN
  Administered 2012-02-15: 500 [IU]
  Filled 2012-02-15: qty 5

## 2012-04-11 ENCOUNTER — Encounter (HOSPITAL_COMMUNITY): Payer: Self-pay

## 2012-04-11 ENCOUNTER — Ambulatory Visit (HOSPITAL_COMMUNITY)
Admission: RE | Admit: 2012-04-11 | Discharge: 2012-04-11 | Disposition: A | Discharge status: Home / Self Care | Payer: Medicare Other | Source: Ambulatory Visit | Attending: Internal Medicine | Admitting: Internal Medicine

## 2012-04-11 ENCOUNTER — Other Ambulatory Visit (HOSPITAL_BASED_OUTPATIENT_CLINIC_OR_DEPARTMENT_OTHER): Payer: Medicare Other | Admitting: Lab

## 2012-04-11 DIAGNOSIS — C3490 Malignant neoplasm of unspecified part of unspecified bronchus or lung: Secondary | ICD-10-CM

## 2012-04-11 DIAGNOSIS — M47814 Spondylosis without myelopathy or radiculopathy, thoracic region: Secondary | ICD-10-CM | POA: Insufficient documentation

## 2012-04-11 DIAGNOSIS — Z85118 Personal history of other malignant neoplasm of bronchus and lung: Secondary | ICD-10-CM | POA: Insufficient documentation

## 2012-04-11 DIAGNOSIS — Z09 Encounter for follow-up examination after completed treatment for conditions other than malignant neoplasm: Secondary | ICD-10-CM | POA: Insufficient documentation

## 2012-04-11 DIAGNOSIS — J90 Pleural effusion, not elsewhere classified: Secondary | ICD-10-CM | POA: Insufficient documentation

## 2012-04-11 DIAGNOSIS — R599 Enlarged lymph nodes, unspecified: Secondary | ICD-10-CM | POA: Insufficient documentation

## 2012-04-11 DIAGNOSIS — C3430 Malignant neoplasm of lower lobe, unspecified bronchus or lung: Secondary | ICD-10-CM

## 2012-04-11 LAB — CBC WITH DIFFERENTIAL/PLATELET
Basophils Absolute: 0 10*3/uL (ref 0.0–0.1)
Eosinophils Absolute: 0.2 10*3/uL (ref 0.0–0.5)
HCT: 34.6 % — ABNORMAL LOW (ref 34.8–46.6)
HGB: 11.6 g/dL (ref 11.6–15.9)
LYMPH%: 6.5 % — ABNORMAL LOW (ref 14.0–49.7)
MONO#: 0.9 10*3/uL (ref 0.1–0.9)
NEUT#: 7.4 10*3/uL — ABNORMAL HIGH (ref 1.5–6.5)
NEUT%: 81.3 % — ABNORMAL HIGH (ref 38.4–76.8)
Platelets: 206 10*3/uL (ref 145–400)
WBC: 9.1 10*3/uL (ref 3.9–10.3)

## 2012-04-11 LAB — COMPREHENSIVE METABOLIC PANEL
ALT: 9 U/L (ref 0–35)
AST: 12 U/L (ref 0–37)
Albumin: 3.5 g/dL (ref 3.5–5.2)
Alkaline Phosphatase: 79 U/L (ref 39–117)
Calcium: 9 mg/dL (ref 8.4–10.5)
Chloride: 99 mEq/L (ref 96–112)
Potassium: 4.3 mEq/L (ref 3.5–5.3)
Sodium: 134 mEq/L — ABNORMAL LOW (ref 135–145)

## 2012-04-16 ENCOUNTER — Ambulatory Visit: Payer: Medicare Other | Admitting: Internal Medicine

## 2012-04-16 ENCOUNTER — Ambulatory Visit (HOSPITAL_BASED_OUTPATIENT_CLINIC_OR_DEPARTMENT_OTHER): Payer: Medicare Other | Admitting: Internal Medicine

## 2012-04-16 ENCOUNTER — Telehealth: Payer: Self-pay | Admitting: Internal Medicine

## 2012-04-16 ENCOUNTER — Ambulatory Visit: Payer: Medicare Other

## 2012-04-16 VITALS — BP 104/60 | HR 92 | Temp 98.1°F

## 2012-04-16 VITALS — BP 113/69 | HR 85 | Temp 98.7°F | Ht 66.0 in | Wt 246.7 lb

## 2012-04-16 DIAGNOSIS — J90 Pleural effusion, not elsewhere classified: Secondary | ICD-10-CM

## 2012-04-16 DIAGNOSIS — C3490 Malignant neoplasm of unspecified part of unspecified bronchus or lung: Secondary | ICD-10-CM

## 2012-04-16 MED ORDER — HEPARIN SOD (PORK) LOCK FLUSH 100 UNIT/ML IV SOLN
500.0000 [IU] | Freq: Once | INTRAVENOUS | Status: AC
  Administered 2012-04-16: 500 [IU] via INTRAVENOUS
  Filled 2012-04-16: qty 5

## 2012-04-16 MED ORDER — SODIUM CHLORIDE 0.9 % IJ SOLN
10.0000 mL | INTRAMUSCULAR | Status: DC | PRN
  Administered 2012-04-16: 10 mL via INTRAVENOUS
  Filled 2012-04-16: qty 10

## 2012-04-16 NOTE — Progress Notes (Signed)
[place] [phone]   [phone]  OFFICE PROGRESS NOTE  [name], MD, MD [place] [address]  DIAGNOSIS: Recurrent non-small cell lung cancer, adenocarcinoma initially diagnosed as stage IIA in March 2008.   PRIOR THERAPY:  1. Status post concurrent chemoradiation with weekly carboplatin and paclitaxel. Last dose was given Apr 12, 2007. 2. Status post 3 cycles of consolidation chemotherapy with docetaxel. Last dose was given July 12, 2007. 3. 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 4. Status post 39 cycles of maintenance Alimta at a dose of 500 mg/m2. Last dose was given February 25, 2010, discontinued secondary to persistent anemia, but the patient has stable disease.  CURRENT THERAPY: Observation.  INTERVAL HISTORY: Diane CREWSE 70 y.o. female returns to the clinic today for routine six-month followup visit. The patient is feeling fine today with no specific complaints. She denied having any significant chest pain and continues to have the baseline shortness breath with exertion. No cough or hemoptysis. No significant weight loss or night sweats. He has repeat CT scan of the chest, abdomen and pelvis performed recently and she is here today for evaluation and discussion of her scan results.  MEDICAL HISTORY: Past Medical History  Diagnosis Date  . Hypertension   . Cancer     lung ca    ALLERGIES:   has no known allergies.  MEDICATIONS:  Current Outpatient Prescriptions  Medication Sig Dispense Refill  . acetaminophen (TYLENOL) 325 MG tablet Take 650 mg by mouth every 6 (six) hours as needed.        Marland Kitchen aspirin 81 MG tablet Take 81 mg by mouth daily.        . diphenhydrAMINE (BENADRYL) 25 MG tablet Take 25 mg by mouth every 6 (six) hours as needed. For allergy relief       . labetalol (NORMODYNE) 200 MG tablet Take 200 mg by mouth 2  (two) times daily.        Marland Kitchen torsemide (DEMADEX) 20 MG tablet Take 20 mg by mouth daily.          SURGICAL HISTORY:  Past Surgical History  Procedure Date  . Portacath placement 11/03/08-[facility]    tip in mid SVC-E. Mansell    REVIEW OF SYSTEMS:  A comprehensive review of systems was negative except for: Respiratory: positive for dyspnea on exertion   PHYSICAL EXAMINATION: General appearance: alert, cooperative and no distress Neck: no adenopathy Lymph nodes: Cervical, supraclavicular, and axillary nodes normal. Resp: clear to auscultation bilaterally Cardio: regular rate and rhythm, S1, S2 normal, no murmur, click, rub or gallop GI: soft, non-tender; bowel sounds normal; no masses,  no organomegaly Extremities: extremities normal, atraumatic, no cyanosis or edema Neurologic: Alert and oriented X 3, normal strength and tone. Normal symmetric reflexes. Normal coordination and gait  ECOG PERFORMANCE STATUS: 1 - Symptomatic but completely ambulatory  Blood pressure 113/69, pulse 85, temperature 98.7 F (37.1 C), temperature source Oral, height 5\' 6"  (1.676 m), weight 246 lb 11.2 oz (111.902 kg).  LABORATORY DATA: Lab Results  Component Value Date   WBC 9.1 04/11/2012   HGB 11.6 04/11/2012   HCT 34.6* 04/11/2012   MCV 92.1 04/11/2012   PLT 206 04/11/2012      Chemistry      Component Value Date/Time   NA 134* 04/11/2012 0856   NA 137 03/16/2011 0742   K 4.3 04/11/2012 0856  K 4.4 03/16/2011 0742   CL 99 04/11/2012 0856   CL 106 03/16/2011 0742   CO2 26 04/11/2012 0856   CO2 27 03/16/2011 0742   BUN 22 04/11/2012 0856   BUN 18 03/16/2011 0742   CREATININE 1.72* 04/11/2012 0856   CREATININE 1.4* 03/16/2011 0742      Component Value Date/Time   CALCIUM 9.0 04/11/2012 0856   CALCIUM 9.6 03/16/2011 0742   ALKPHOS 79 04/11/2012 0856   ALKPHOS 92* 03/16/2011 0742   AST 12 04/11/2012 0856   AST 18 03/16/2011 0742   ALT 9 04/11/2012 0856   BILITOT 0.7 04/11/2012 0856   BILITOT 0.60 03/16/2011  0742       RADIOGRAPHIC STUDIES: Ct Abdomen Pelvis Wo Contrast  04/11/2012  *RADIOLOGY REPORT*  Clinical Data:  Lung cancer diagnosed March 2008, XRT complete June 2008, chemotherapy complete April 2011.  CT CHEST, ABDOMEN AND PELVIS WITHOUT CONTRAST  Technique:  Multidetector CT imaging of the chest, abdomen and pelvis was performed following the standard protocol without IV contrast.  Comparison:  10/12/2011.  CT CHEST  Findings:  Right hilar/paramediastinal/lower lobe radiation changes.  Mild soft tissue fullness posteriorly without interval change (series 2/image 30).  Additional scattered patchy/nodular ground-glass opacities in the right lung, unchanged, likely reflecting radiation changes / scarring.  Loculated right pleural effusion with associated abnormal soft tissue densities, possibly irregular pleural thickening / nodularity (series 2/images 40 and 43).  The appearance has progressed across prior studies and is worrisome for pleural malignancy.  The left lung is essentially clear.  Underlying mild centrilobular emphysematous changes.  No pneumothorax.  The visualized thyroid is unremarkable.  Mild cardiomegaly.  No pericardial effusion.  Coronary atherosclerosis.  Atherosclerotic calcifications of the aortic arch.  Left chest port.  No suspicious mediastinal or axillary lymphadenopathy.  Degenerative changes of the thoracic spine.  Stable sclerosis involving the upper/mid thoracic vertebral spine (sagittal image 75).  IMPRESSION: Loculated right pleural effusion with associated increasing irregular pleural soft tissue/thickening/nodularity, worrisome for malignancy.  Consider thoracentesis or PET CT for confirmation.  Otherwise stable right lung/paramediastinal radiation changes.  CT ABDOMEN AND PELVIS  Findings:  Motion degraded images.  Unenhanced liver, spleen, pancreas, and adrenal glands are within normal limits.  Gallbladder is unremarkable.  No intrahepatic or extrahepatic ductal  dilatation.  Kidneys are within normal limits.  No renal calculi versus.  No evidence of bowel obstruction.  Normal appendix.  Atherosclerotic calcifications of the abdominal aorta and branch vessels.  No abdominopelvic ascites.  Small upper abdominal lymph nodes measuring up to 10 mm short axis (series 2/image 54), at the upper limits of normal.  Uterus and bilateral ovaries are unremarkable.  Bladder is within normal limits.  Degenerative changes of the lumbar spine.  IMPRESSION: Motion degraded images.  No evidence of metastatic disease in the abdomen/pelvis.  Borderline enlarged upper abdominal lymph nodes.  Original Report Authenticated By: Julian Hy, M.D.   Ct Chest Wo Contrast  04/11/2012  *RADIOLOGY REPORT*  Clinical Data:  Lung cancer diagnosed March 2008, XRT complete June 2008, chemotherapy complete April 2011.  CT CHEST, ABDOMEN AND PELVIS WITHOUT CONTRAST  Technique:  Multidetector CT imaging of the chest, abdomen and pelvis was performed following the standard protocol without IV contrast.  Comparison:  10/12/2011.  CT CHEST  Findings:  Right hilar/paramediastinal/lower lobe radiation changes.  Mild soft tissue fullness posteriorly without interval change (series 2/image 30).  Additional scattered patchy/nodular ground-glass opacities in the right lung, unchanged, likely reflecting radiation changes / scarring.  Loculated right pleural effusion with associated abnormal soft tissue densities, possibly irregular pleural thickening / nodularity (series 2/images 40 and 43).  The appearance has progressed across prior studies and is worrisome for pleural malignancy.  The left lung is essentially clear.  Underlying mild centrilobular emphysematous changes.  No pneumothorax.  The visualized thyroid is unremarkable.  Mild cardiomegaly.  No pericardial effusion.  Coronary atherosclerosis.  Atherosclerotic calcifications of the aortic arch.  Left chest port.  No suspicious mediastinal or axillary  lymphadenopathy.  Degenerative changes of the thoracic spine.  Stable sclerosis involving the upper/mid thoracic vertebral spine (sagittal image 75).  IMPRESSION: Loculated right pleural effusion with associated increasing irregular pleural soft tissue/thickening/nodularity, worrisome for malignancy.  Consider thoracentesis or PET CT for confirmation.  Otherwise stable right lung/paramediastinal radiation changes.  CT ABDOMEN AND PELVIS  Findings:  Motion degraded images.  Unenhanced liver, spleen, pancreas, and adrenal glands are within normal limits.  Gallbladder is unremarkable.  No intrahepatic or extrahepatic ductal dilatation.  Kidneys are within normal limits.  No renal calculi versus.  No evidence of bowel obstruction.  Normal appendix.  Atherosclerotic calcifications of the abdominal aorta and branch vessels.  No abdominopelvic ascites.  Small upper abdominal lymph nodes measuring up to 10 mm short axis (series 2/image 54), at the upper limits of normal.  Uterus and bilateral ovaries are unremarkable.  Bladder is within normal limits.  Degenerative changes of the lumbar spine.  IMPRESSION: Motion degraded images.  No evidence of metastatic disease in the abdomen/pelvis.  Borderline enlarged upper abdominal lymph nodes.  Original Report Authenticated By: Julian Hy, M.D.    ASSESSMENT: This is a very pleasant 71 years old white female with recurrent non-small cell lung cancer status post systemic chemotherapy followed by maintenance treatment with Alimta and has been observation since April 2011.  PLAN: The patient is doing fine and she has no evidence for disease progression. I will continue to monitor the right pleural effusion closely. She would have repeat CT scan of the chest, abdomen and pelvis in 3 months and she would come back for followup visit at that time. She was advised to call me immediately if she has any concerning symptoms in the interval.  All questions were answered. The  patient knows to call the clinic with any problems, questions or concerns. We can certainly see the patient much sooner if necessary.

## 2012-04-16 NOTE — Telephone Encounter (Signed)
appts made and printed for pt ,note to mkm to reorder ct to w/out contrast    aom

## 2012-04-25 ENCOUNTER — Encounter: Payer: Self-pay | Admitting: *Deleted

## 2012-04-25 NOTE — Progress Notes (Signed)
labwork from [place] on 5/14 given to Dr Vista Mink to review, will scan into EP[facility],  SLJ

## 2012-05-28 ENCOUNTER — Ambulatory Visit (HOSPITAL_BASED_OUTPATIENT_CLINIC_OR_DEPARTMENT_OTHER): Payer: Medicare Other

## 2012-05-28 VITALS — BP 98/62 | HR 88 | Temp 97.5°F

## 2012-05-28 DIAGNOSIS — C3430 Malignant neoplasm of lower lobe, unspecified bronchus or lung: Secondary | ICD-10-CM

## 2012-05-28 DIAGNOSIS — Z452 Encounter for adjustment and management of vascular access device: Secondary | ICD-10-CM

## 2012-05-28 DIAGNOSIS — C3490 Malignant neoplasm of unspecified part of unspecified bronchus or lung: Secondary | ICD-10-CM

## 2012-05-28 MED ORDER — HEPARIN SOD (PORK) LOCK FLUSH 100 UNIT/ML IV SOLN
500.0000 [IU] | Freq: Once | INTRAVENOUS | Status: AC
  Administered 2012-05-28: 500 [IU] via INTRAVENOUS
  Filled 2012-05-28: qty 5

## 2012-05-28 MED ORDER — SODIUM CHLORIDE 0.9 % IJ SOLN
10.0000 mL | INTRAMUSCULAR | Status: DC | PRN
  Administered 2012-05-28: 10 mL via INTRAVENOUS
  Filled 2012-05-28: qty 10

## 2012-05-28 NOTE — Patient Instructions (Signed)
Call MD for problems 

## 2012-06-04 ENCOUNTER — Other Ambulatory Visit (HOSPITAL_COMMUNITY): Payer: Self-pay | Admitting: Family Medicine

## 2012-06-04 DIAGNOSIS — Z1231 Encounter for screening mammogram for malignant neoplasm of breast: Secondary | ICD-10-CM

## 2012-06-14 ENCOUNTER — Telehealth: Payer: Self-pay | Admitting: *Deleted

## 2012-06-14 NOTE — Telephone Encounter (Signed)
Pt called wanting to know whether she can have a jury duty letter excusing her due to having lung cancer.  Per Dr Vista Mink, pt is on observation, he will not write a letter excusing her from jury duty.  Left msg on home phone.  SLJ

## 2012-06-21 ENCOUNTER — Ambulatory Visit (HOSPITAL_COMMUNITY)
Admission: RE | Admit: 2012-06-21 | Discharge: 2012-06-21 | Disposition: A | Discharge status: Home / Self Care | Payer: Medicare Other | Source: Ambulatory Visit | Attending: Family Medicine | Admitting: Family Medicine

## 2012-06-21 DIAGNOSIS — Z1231 Encounter for screening mammogram for malignant neoplasm of breast: Secondary | ICD-10-CM | POA: Insufficient documentation

## 2012-07-11 ENCOUNTER — Other Ambulatory Visit: Payer: Self-pay | Admitting: Internal Medicine

## 2012-07-11 ENCOUNTER — Ambulatory Visit (HOSPITAL_COMMUNITY)
Admission: RE | Admit: 2012-07-11 | Discharge: 2012-07-11 | Disposition: A | Discharge status: Home / Self Care | Payer: Medicare Other | Source: Ambulatory Visit | Attending: Internal Medicine | Admitting: Internal Medicine

## 2012-07-11 ENCOUNTER — Other Ambulatory Visit (HOSPITAL_BASED_OUTPATIENT_CLINIC_OR_DEPARTMENT_OTHER): Payer: Medicare Other | Admitting: Lab

## 2012-07-11 ENCOUNTER — Ambulatory Visit (HOSPITAL_BASED_OUTPATIENT_CLINIC_OR_DEPARTMENT_OTHER): Payer: Medicare Other

## 2012-07-11 ENCOUNTER — Telehealth: Payer: Self-pay | Admitting: *Deleted

## 2012-07-11 VITALS — BP 119/66 | HR 85 | Temp 97.1°F

## 2012-07-11 DIAGNOSIS — I700 Atherosclerosis of aorta: Secondary | ICD-10-CM | POA: Insufficient documentation

## 2012-07-11 DIAGNOSIS — C3490 Malignant neoplasm of unspecified part of unspecified bronchus or lung: Secondary | ICD-10-CM

## 2012-07-11 DIAGNOSIS — Z452 Encounter for adjustment and management of vascular access device: Secondary | ICD-10-CM

## 2012-07-11 DIAGNOSIS — M899 Disorder of bone, unspecified: Secondary | ICD-10-CM | POA: Insufficient documentation

## 2012-07-11 DIAGNOSIS — I2510 Atherosclerotic heart disease of native coronary artery without angina pectoris: Secondary | ICD-10-CM | POA: Insufficient documentation

## 2012-07-11 DIAGNOSIS — Z9221 Personal history of antineoplastic chemotherapy: Secondary | ICD-10-CM | POA: Insufficient documentation

## 2012-07-11 DIAGNOSIS — J90 Pleural effusion, not elsewhere classified: Secondary | ICD-10-CM | POA: Insufficient documentation

## 2012-07-11 DIAGNOSIS — C3430 Malignant neoplasm of lower lobe, unspecified bronchus or lung: Secondary | ICD-10-CM

## 2012-07-11 LAB — CBC WITH DIFFERENTIAL/PLATELET
Basophils Absolute: 0 10*3/uL (ref 0.0–0.1)
Eosinophils Absolute: 0.2 10*3/uL (ref 0.0–0.5)
HGB: 12.2 g/dL (ref 11.6–15.9)
LYMPH%: 13 % — ABNORMAL LOW (ref 14.0–49.7)
MCV: 92.6 fL (ref 79.5–101.0)
MONO%: 9.7 % (ref 0.0–14.0)
NEUT#: 4.1 10*3/uL (ref 1.5–6.5)
NEUT%: 73.2 % (ref 38.4–76.8)
Platelets: 164 10*3/uL (ref 145–400)

## 2012-07-11 LAB — CMP (CANCER CENTER ONLY)
Albumin: 3.1 g/dL — ABNORMAL LOW (ref 3.3–5.5)
Alkaline Phosphatase: 71 U/L (ref 26–84)
BUN, Bld: 16 mg/dL (ref 7–22)
Creat: 1.5 mg/dl — ABNORMAL HIGH (ref 0.6–1.2)
Glucose, Bld: 94 mg/dL (ref 73–118)
Total Bilirubin: 0.8 mg/dl (ref 0.20–1.60)

## 2012-07-11 MED ORDER — HEPARIN SOD (PORK) LOCK FLUSH 100 UNIT/ML IV SOLN
500.0000 [IU] | Freq: Once | INTRAVENOUS | Status: AC
  Administered 2012-07-11: 500 [IU] via INTRAVENOUS
  Filled 2012-07-11: qty 5

## 2012-07-11 MED ORDER — SODIUM CHLORIDE 0.9 % IJ SOLN
10.0000 mL | INTRAMUSCULAR | Status: DC | PRN
  Administered 2012-07-11: 10 mL via INTRAVENOUS
  Filled 2012-07-11: qty 10

## 2012-07-11 NOTE — Telephone Encounter (Signed)
Diane [name] called about today's CT chest, abdomen, pelvis ordered with contrast.  Patient says she is almost in renal failure and was told by [name] Nephrologist sghe may not have contrast unless approved by him.  She is not going to let us give a limited amount of IV contrast and today's creat. = 1.5.  She's not been tested with contrast since January 2012.  Will notify providers.  Today's CT C/A/P scan will be done without contrast.

## 2012-07-16 ENCOUNTER — Encounter: Payer: Self-pay | Admitting: *Deleted

## 2012-07-16 ENCOUNTER — Telehealth: Payer: Self-pay | Admitting: Internal Medicine

## 2012-07-16 ENCOUNTER — Ambulatory Visit (HOSPITAL_BASED_OUTPATIENT_CLINIC_OR_DEPARTMENT_OTHER): Payer: Medicare Other | Admitting: Internal Medicine

## 2012-07-16 VITALS — BP 108/66 | HR 87 | Temp 97.6°F | Resp 20 | Ht 66.0 in | Wt 248.5 lb

## 2012-07-16 DIAGNOSIS — C3430 Malignant neoplasm of lower lobe, unspecified bronchus or lung: Secondary | ICD-10-CM

## 2012-07-16 DIAGNOSIS — C3490 Malignant neoplasm of unspecified part of unspecified bronchus or lung: Secondary | ICD-10-CM

## 2012-07-16 NOTE — Telephone Encounter (Signed)
appts made and printed for pt,moved 9/24 flush to 9/17   aom

## 2012-07-16 NOTE — Progress Notes (Signed)
Requested EGFR/ALK per Dr. Worthy Flank order

## 2012-07-16 NOTE — Progress Notes (Signed)
[facility] [phone]   [phone]) [phone]  OFFICE PROGRESS NOTE  [name], MD [address] [place] [address]  DIAGNOSIS: Recurrent non-small cell lung cancer, adenocarcinoma initially diagnosed as stage IIA in March 2008.   PRIOR THERAPY:  1. Status post concurrent chemoradiation with weekly carboplatin and paclitaxel. Last dose was given Apr 12, 2007. 2. Status post 3 cycles of consolidation chemotherapy with docetaxel. Last dose was given July 12, 2007. 3. 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 4. Status post 39 cycles of maintenance Alimta at a dose of 500 mg/m2. Last dose was given February 25, 2010, discontinued secondary to persistent anemia, but the patient has stable disease.  CURRENT THERAPY: Observation.   INTERVAL HISTORY: Diane [name] 71 y.o. female returns to the clinic today for three-month followup visit. The patient is doing fine with no specific complaints except for shortness breath with exertion. She denied having any significant chest pain, cough or hemoptysis. She denied having any significant weight loss or night sweats. She has repeat CT scan of the chest, abdomen and pelvis performed recently and she is here for evaluation and discussion of her scan results.  MEDICAL HISTORY: Past Medical History  Diagnosis Date  . Hypertension   . Cancer     lung ca    ALLERGIES:   has no known allergies.  MEDICATIONS:  Current Outpatient Prescriptions  Medication Sig Dispense Refill  . acetaminophen (TYLENOL) 325 MG tablet Take 650 mg by mouth every 6 (six) hours as needed.        Marland Kitchen aspirin 81 MG tablet Take 81 mg by mouth daily.        Marland Kitchen labetalol (NORMODYNE) 200 MG tablet Take 200 mg by mouth 2 (two) times daily.        Marland Kitchen torsemide (DEMADEX) 20 MG tablet Take 20 mg by mouth daily.        . calcitRIOL (ROCALTROL) 0.25 MCG capsule Take 0.25  mcg by mouth every Monday, Wednesday, and Friday.        SURGICAL HISTORY:  Past Surgical History  Procedure Date  . Portacath placement 11/03/08-[facility]    tip in mid SVC-E. Mansell    REVIEW OF SYSTEMS:  A comprehensive review of systems was negative except for: Respiratory: positive for dyspnea on exertion   PHYSICAL EXAMINATION: General appearance: alert, cooperative and no distress Neck: no adenopathy Lymph nodes: Cervical, supraclavicular, and axillary nodes normal. Resp: clear to auscultation bilaterally Cardio: regular rate and rhythm, S1, S2 normal, no murmur, click, rub or gallop GI: soft, non-tender; bowel sounds normal; no masses,  no organomegaly Extremities: extremities normal, atraumatic, no cyanosis or edema  ECOG PERFORMANCE STATUS: 1 - Symptomatic but completely ambulatory  Blood pressure 108/66, pulse 87, temperature 97.6 F (36.4 C), temperature source Oral, resp. rate 20, height 5\' 6"  (1.676 m), weight 248 lb 8 oz (112.719 kg).  LABORATORY DATA: Lab Results  Component Value Date   WBC 5.6 07/11/2012   HGB 12.2 07/11/2012   HCT 36.4 07/11/2012   MCV 92.6 07/11/2012   PLT 164 07/11/2012      Chemistry      Component Value Date/Time   NA 134* 04/11/2012 0856   NA 137 03/16/2011 0742   K 4.3 04/11/2012 0856   K 4.4 03/16/2011 0742   CL 99 04/11/2012 0856   CL 106 03/16/2011 0742   CO2 26 04/11/2012 0856  CO2 27 03/16/2011 0742   BUN 22 04/11/2012 0856   BUN 18 03/16/2011 0742   CREATININE 1.72* 04/11/2012 0856   CREATININE 1.4* 03/16/2011 0742      Component Value Date/Time   CALCIUM 9.0 04/11/2012 0856   CALCIUM 9.6 03/16/2011 0742   ALKPHOS 79 04/11/2012 0856   ALKPHOS 92* 03/16/2011 0742   AST 12 04/11/2012 0856   AST 18 03/16/2011 0742   ALT 9 04/11/2012 0856   BILITOT 0.7 04/11/2012 0856   BILITOT 0.60 03/16/2011 0742       RADIOGRAPHIC STUDIES: Ct Chest Wo Contrast  07/11/2012  *RADIOLOGY REPORT*  Clinical Data:  Lung cancer diagnosed 2008, status post  chemotherapy XRT  CT CHEST, ABDOMEN AND PELVIS WITHOUT CONTRAST  Technique:  Multidetector CT imaging of the chest, abdomen and pelvis was performed following the standard protocol without IV contrast.  Comparison:  04/11/2012   CT CHEST  Findings:  Stable right hilar/lower lobe paramediastinal radiation changes.  Associated lower lobe soft tissue fullness (series 2/image 32), unchanged.  Scattered patchy/nodular opacities, predominantly in the right lung, unchanged.  Underlying mild to moderate centrilobular emphysematous changes.  Stable moderate right pleural effusion with associated abnormal soft tissue density/nodularity, increased.  For example, there is a 3.8 x 3.8 cm lesion medially (series 2/image 41), previously 3.6 x 3.2 cm.  Appearance remains worrisome for pleural tumor.  No pneumothorax.  Visualized thyroid is notable for a left lower thyroid nodule.  The heart is normal in size.  No pericardial effusion.  Coronary atherosclerosis.  Atherosclerotic calcifications of the aortic arch.  No suspicious mediastinal or axillary lymphadenopathy.  Left chest port.  Degenerative changes of the thoracic spine.  Stable sclerosis involving the upper/mid thoracic spine (sagittal image 59).  IMPRESSION: Stable moderate complex right pleural effusion. Continued progression of suspected abnormal soft tissue components, worrisome for pleural tumor.  Stable right hilar/lower lobe paramediastinal radiation changes.   CT ABDOMEN AND PELVIS  Findings:  Unenhanced liver, spleen, pancreas, and adrenal glands are within normal limits.  Gallbladder is unremarkable.  No intrahepatic or extrahepatic ductal dilatation.  Kidneys are unremarkable.  No renal calculi or hydronephrosis.  Atherosclerotic calcifications of the abdominal aorta and branch vessels.  No evidence of bowel obstruction.  Normal appendix.  No abdominopelvic ascites.  Small upper abdominal lymph nodes which do not meet pathologic CT size criteria.  Uterus and  bilateral ovaries are unremarkable.  Bladder is within normal limits.  Mild degenerate changes of the lumbar spine.  IMPRESSION: No evidence of metastatic disease in the abdomen/pelvis.  Original Report Authenticated By: Julian Hy, M.D.   Mm Digital Screening  06/24/2012  *RADIOLOGY REPORT*  Clinical Data: Screening.  DIGITAL SCREENING MAMMOGRAM WITH CAD  Comparison:  Previous exams  Findings:  There are scattered fibroglandular densities. No suspicious masses, architectural distortion, or calcifications are present.  Images were processed with CAD.  IMPRESSION: No mammographic evidence of malignancy.  A result letter of this screening mammogram will be mailed directly to the patient.  RECOMMENDATION: Screening mammogram in one year. (Code:SM-B-01Y)  BI-RADS CATEGORY 1:  Negative  Original Report Authenticated By: DINA L. ARCEO, M.D.    ASSESSMENT: This is a very pleasant 71 years old white female with recurrent non-small cell lung cancer, adenocarcinoma status post concurrent chemoradiation followed by consolidation chemotherapy as well as maintenance Alimta for 39 cycles discontinued in 02/25/2010 secondary to anemia and renal dysfunction. She has been observation since that time was no evidence for disease progression except on  the recent scan which showed progression of suspected abnormal soft tissue component worrisome for pleural tumor.  PLAN: I discussed the scan results with the patient. I recommended for her to consider resuming systemic therapy again versus observation and repeat scan in 3 months. The patient is concerned about further disease progression on she would like to consider treatment. Because of her renal insufficiency, I cannot start the patient back on Alimta. I would consider her for treatment with either Tarceva or second line chemotherapy with docetaxel.  I also recommended to send her resected tissue block to be tested for EGFR mutation as well as ALK gene translocation  before making the final decision regarding her treatment. We will contact the pathology Department to send the archival tissue for testing. I would see the patient back for followup visit in one month's for evaluation and discussion of her treatment options based on the medication status. She was advised to call me immediately if she has any concerning symptoms in the interval.  All questions were answered. The patient knows to call the clinic with any problems, questions or concerns. We can certainly see the patient much sooner if necessary.

## 2012-08-12 ENCOUNTER — Telehealth: Payer: Self-pay | Admitting: Medical Oncology

## 2012-08-12 NOTE — Telephone Encounter (Signed)
Does she need driver tomorrow after chemo. I told pt she should not need a driver

## 2012-08-13 ENCOUNTER — Telehealth: Payer: Self-pay | Admitting: Internal Medicine

## 2012-08-13 ENCOUNTER — Other Ambulatory Visit: Payer: Self-pay | Admitting: Physician Assistant

## 2012-08-13 ENCOUNTER — Ambulatory Visit: Payer: Medicare Other

## 2012-08-13 ENCOUNTER — Ambulatory Visit (HOSPITAL_BASED_OUTPATIENT_CLINIC_OR_DEPARTMENT_OTHER): Payer: Medicare Other | Admitting: Internal Medicine

## 2012-08-13 ENCOUNTER — Other Ambulatory Visit (HOSPITAL_BASED_OUTPATIENT_CLINIC_OR_DEPARTMENT_OTHER): Payer: Medicare Other | Admitting: Lab

## 2012-08-13 VITALS — BP 115/69 | HR 91 | Temp 97.5°F | Resp 20 | Ht 66.0 in | Wt 250.5 lb

## 2012-08-13 DIAGNOSIS — C3430 Malignant neoplasm of lower lobe, unspecified bronchus or lung: Secondary | ICD-10-CM

## 2012-08-13 DIAGNOSIS — C3490 Malignant neoplasm of unspecified part of unspecified bronchus or lung: Secondary | ICD-10-CM

## 2012-08-13 LAB — COMPREHENSIVE METABOLIC PANEL (CC13)
ALT: 9 U/L (ref 0–55)
AST: 10 U/L (ref 5–34)
Albumin: 3.2 g/dL — ABNORMAL LOW (ref 3.5–5.0)
Alkaline Phosphatase: 81 U/L (ref 40–150)
BUN: 25 mg/dL (ref 7.0–26.0)
CO2: 26 mEq/L (ref 22–29)
Calcium: 9.9 mg/dL (ref 8.4–10.4)
Chloride: 105 mEq/L (ref 98–107)
Creatinine: 1.6 mg/dL — ABNORMAL HIGH (ref 0.6–1.1)
Glucose: 88 mg/dl (ref 70–99)
Potassium: 4.7 mEq/L (ref 3.5–5.1)
Sodium: 139 mEq/L (ref 136–145)
Total Bilirubin: 0.7 mg/dL (ref 0.20–1.20)
Total Protein: 6.2 g/dL — ABNORMAL LOW (ref 6.4–8.3)

## 2012-08-13 LAB — CBC WITH DIFFERENTIAL/PLATELET
BASO%: 0.9 % (ref 0.0–2.0)
Basophils Absolute: 0 10*3/uL (ref 0.0–0.1)
EOS%: 2.8 % (ref 0.0–7.0)
Eosinophils Absolute: 0.1 10*3/uL (ref 0.0–0.5)
HCT: 35.9 % (ref 34.8–46.6)
HGB: 11.9 g/dL (ref 11.6–15.9)
LYMPH%: 12.3 % — ABNORMAL LOW (ref 14.0–49.7)
MCH: 30.8 pg (ref 25.1–34.0)
MCHC: 33.2 g/dL (ref 31.5–36.0)
MCV: 92.8 fL (ref 79.5–101.0)
MONO#: 0.5 10*3/uL (ref 0.1–0.9)
MONO%: 10.9 % (ref 0.0–14.0)
NEUT#: 3.6 10*3/uL (ref 1.5–6.5)
NEUT%: 73.1 % (ref 38.4–76.8)
Platelets: 157 10*3/uL (ref 145–400)
RBC: 3.87 10*6/uL (ref 3.70–5.45)
RDW: 14.3 % (ref 11.2–14.5)
WBC: 5 10*3/uL (ref 3.9–10.3)
lymph#: 0.6 10*3/uL — ABNORMAL LOW (ref 0.9–3.3)

## 2012-08-13 MED ORDER — HEPARIN SOD (PORK) LOCK FLUSH 100 UNIT/ML IV SOLN
500.0000 [IU] | Freq: Once | INTRAVENOUS | Status: AC
  Administered 2012-08-13: 500 [IU] via INTRAVENOUS
  Filled 2012-08-13: qty 5

## 2012-08-13 MED ORDER — SODIUM CHLORIDE 0.9 % IJ SOLN
10.0000 mL | INTRAMUSCULAR | Status: DC | PRN
  Administered 2012-08-13: 10 mL via INTRAVENOUS
  Filled 2012-08-13: qty 10

## 2012-08-13 NOTE — Telephone Encounter (Signed)
Gave pt appt for ML 1 wk from today 08/20/12, no labs

## 2012-08-13 NOTE — Progress Notes (Signed)
[place] [phone]   Fax:(336) [phone]  OFFICE PROGRESS NOTE  [name], MD [address] [place] [address]  DIAGNOSIS: Recurrent non-small cell lung cancer, adenocarcinoma initially diagnosed as stage IIA in March 2008. The tumor cells were negative for EGFR mutation as well as ALK gene translocation.  PRIOR THERAPY:  1. Status post concurrent chemoradiation with weekly carboplatin and paclitaxel. Last dose was given Apr 12, 2007. 2. Status post 3 cycles of consolidation chemotherapy with docetaxel. Last dose was given July 12, 2007. 3. 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 4. Status post 39 cycles of maintenance Alimta at a dose of 500 mg/m2. Last dose was given February 25, 2010, discontinued secondary to persistent anemia, but the patient has stable disease.  CURRENT THERAPY: Observation.  INTERVAL HISTORY: Diane [name] 71 y.o. female returns to the clinic today for followup visit. The patient is feeling fine today with no specific complaints except for shortness breath with exertion. She denied having any significant chest pain, cough or hemoptysis. She denied having any significant weight loss or night sweats. We send her tissue to be tested for EGFR mutation as well as ALK gene translocation. Both tests were negative. The patient is here today for evaluation and discussion of her treatment options.  MEDICAL HISTORY: Past Medical History  Diagnosis Date  . Hypertension   . Cancer     lung ca    ALLERGIES:   has no known allergies.  MEDICATIONS:  Current Outpatient Prescriptions  Medication Sig Dispense Refill  . aspirin 81 MG tablet Take 81 mg by mouth daily.        . calcitRIOL (ROCALTROL) 0.25 MCG capsule Take 0.25 mcg by mouth every Monday, Wednesday, and Friday.      . labetalol (NORMODYNE) 200 MG tablet Take 200 mg by mouth 2 (two) times  daily.        Marland Kitchen torsemide (DEMADEX) 20 MG tablet Take 20 mg by mouth daily.        Marland Kitchen acetaminophen (TYLENOL) 325 MG tablet Take 650 mg by mouth every 6 (six) hours as needed.          SURGICAL HISTORY:  Past Surgical History  Procedure Date  . Portacath placement 11/03/08-[facility]    tip in mid SVC-E. Mansell    REVIEW OF SYSTEMS:  A comprehensive review of systems was negative except for: Respiratory: positive for dyspnea on exertion   PHYSICAL EXAMINATION: General appearance: alert, cooperative and no distress Head: Normocephalic, without obvious abnormality, atraumatic Neck: no adenopathy Lymph nodes: Cervical, supraclavicular, and axillary nodes normal. Resp: clear to auscultation bilaterally Cardio: regular rate and rhythm, S1, S2 normal, no murmur, click, rub or gallop GI: soft, non-tender; bowel sounds normal; no masses,  no organomegaly Extremities: extremities normal, atraumatic, no cyanosis or edema Neurologic: Alert and oriented X 3, normal strength and tone. Normal symmetric reflexes. Normal coordination and gait  ECOG PERFORMANCE STATUS: 1 - Symptomatic but completely ambulatory  Blood pressure 115/69, pulse 91, temperature 97.5 F (36.4 C), resp. rate 20, height 5\' 6"  (1.676 m), weight 250 lb 8 oz (113.626 kg).  LABORATORY DATA: Lab Results  Component Value Date   WBC 5.0 08/13/2012   HGB 11.9 08/13/2012   HCT 35.9 08/13/2012   MCV 92.8 08/13/2012   PLT 157 08/13/2012      Chemistry      Component Value Date/Time   NA  139 08/13/2012 1110   NA 143 07/11/2012 0927   NA 134* 04/11/2012 0856   K 4.7 08/13/2012 1110   K 4.5 07/11/2012 0927   K 4.3 04/11/2012 0856   CL 105 08/13/2012 1110   CL 101 07/11/2012 0927   CL 99 04/11/2012 0856   CO2 26 08/13/2012 1110   CO2 29 07/11/2012 0927   CO2 26 04/11/2012 0856   BUN 25.0 08/13/2012 1110   BUN 16 07/11/2012 0927   BUN 22 04/11/2012 0856   CREATININE 1.6* 08/13/2012 1110   CREATININE 1.5* 07/11/2012 0927   CREATININE 1.72*  04/11/2012 0856      Component Value Date/Time   CALCIUM 9.9 08/13/2012 1110   CALCIUM 10.0 07/11/2012 0927   CALCIUM 9.0 04/11/2012 0856   ALKPHOS 81 08/13/2012 1110   ALKPHOS 71 07/11/2012 0927   ALKPHOS 79 04/11/2012 0856   AST 10 08/13/2012 1110   AST 19 07/11/2012 0927   AST 12 04/11/2012 0856   ALT 9 08/13/2012 1110   ALT 9 04/11/2012 0856   BILITOT 0.70 08/13/2012 1110   BILITOT 0.80 07/11/2012 0927   BILITOT 0.7 04/11/2012 0856       RADIOGRAPHIC STUDIES: No results found.  ASSESSMENT: This is a very pleasant 71 years old white female with recurrent non-small cell lung cancer, adenocarcinoma with negative EGFR mutation and negative ALK gene translocation with a recent evidence for disease progression  PLAN: I have a lengthy discussion with the patient today about her current condition and treatment options. I gave her the option of systemic chemotherapy with single agent docetaxel 75 mg/M2 every 3 weeks with Neulasta support versus treatment with oral Tarceva. The patient is interested in treatment with oral Tarceva. I recommended for her to do a Veristrat test as a predictive test for her response to Tarceva.  I would see the patient back for followup visit next week for evaluation. I discussed with her the adverse effect of Tarceva including but not limited to a skin rash, diarrhea, interstitial lung disease as well as liver or renal dysfunction. I also gave the patient and handout and a Tarceva initiation kit. She was advised to call me immediately if she has any concerning symptoms in the interval.  All questions were answered. The patient knows to call the clinic with any problems, questions or concerns. We can certainly see the patient much sooner if necessary.  I spent 15 minutes counseling the patient face to face. The total time spent in the appointment was 25 minutes.

## 2012-08-13 NOTE — Patient Instructions (Signed)
Your tests for EGFR mutation as well as ALK gene translocation were negative. Your treatment options include systemic chemotherapy versus oral Tarceva as a second line treatment. We will consider Veristrat test as a predictor of response to Tarceva. Followup in 1 week

## 2012-08-19 ENCOUNTER — Other Ambulatory Visit: Payer: Self-pay | Admitting: Medical Oncology

## 2012-08-19 DIAGNOSIS — C3490 Malignant neoplasm of unspecified part of unspecified bronchus or lung: Secondary | ICD-10-CM

## 2012-08-19 LAB — VERISTRAT

## 2012-08-20 ENCOUNTER — Encounter: Payer: Self-pay | Admitting: Physician Assistant

## 2012-08-20 ENCOUNTER — Other Ambulatory Visit: Payer: Self-pay | Admitting: Medical Oncology

## 2012-08-20 ENCOUNTER — Telehealth: Payer: Self-pay | Admitting: Medical Oncology

## 2012-08-20 ENCOUNTER — Telehealth: Payer: Self-pay | Admitting: Internal Medicine

## 2012-08-20 ENCOUNTER — Ambulatory Visit (HOSPITAL_BASED_OUTPATIENT_CLINIC_OR_DEPARTMENT_OTHER): Payer: Medicare Other | Admitting: Physician Assistant

## 2012-08-20 VITALS — BP 142/81 | HR 94 | Temp 97.5°F | Resp 20 | Ht 66.0 in | Wt 248.8 lb

## 2012-08-20 DIAGNOSIS — C3490 Malignant neoplasm of unspecified part of unspecified bronchus or lung: Secondary | ICD-10-CM

## 2012-08-20 DIAGNOSIS — C3430 Malignant neoplasm of lower lobe, unspecified bronchus or lung: Secondary | ICD-10-CM

## 2012-08-20 MED ORDER — ERLOTINIB HCL 150 MG PO TABS: 150.0000 mg | ORAL_TABLET | Freq: Every day | ORAL | Status: DC

## 2012-08-20 NOTE — Patient Instructions (Addendum)
Follow up in 2 weeks as scheduled after you have started taking the Tarceva

## 2012-08-20 NOTE — Progress Notes (Signed)
chart review only erronous

## 2012-08-20 NOTE — Telephone Encounter (Signed)
[facility] received order for tarceva and are getting it prior authorized. They requested additional information so i sent last office note.

## 2012-08-20 NOTE — Telephone Encounter (Signed)
Gave pt appt for October lab and ML 2013

## 2012-08-20 NOTE — Telephone Encounter (Signed)
Fax confirmation received for tarceva to [facility]-pt notified

## 2012-08-21 NOTE — Telephone Encounter (Signed)
Pt received a call from [facility] who told her she can get Tarceva locally . I called to confirm this with [facility].Pt will go into [facility] and they will get her connected with [facility] access Network for copay assistance

## 2012-08-23 ENCOUNTER — Encounter: Payer: Self-pay | Admitting: Internal Medicine

## 2012-08-23 NOTE — Telephone Encounter (Signed)
RECEIVED A FAX FROM [facility] CONCERNING A CONFIRMATION OF PRESCRIPTION SHIPMENT FOR [place] ON 08/22/12.

## 2012-08-25 NOTE — Progress Notes (Signed)
[place] Telephone:(336) [phone]   Fax:(336) [phone]  OFFICE PROGRESS NOTE  [name], MD [address] [place] [address]  DIAGNOSIS: Recurrent non-small cell lung cancer, adenocarcinoma initially diagnosed as stage IIA in March 2008. The tumor cells were negative for EGFR mutation as well as ALK gene translocation.  PRIOR THERAPY:  1. Status post concurrent chemoradiation with weekly carboplatin and paclitaxel. Last dose was given Apr 12, 2007. 2. Status post 3 cycles of consolidation chemotherapy with docetaxel. Last dose was given July 12, 2007. 3. 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 4. Status post 39 cycles of maintenance Alimta at a dose of 500 mg/m2. Last dose was given February 25, 2010, discontinued secondary to persistent anemia, but the patient has stable disease.  CURRENT THERAPY: Tarceva 150 mg by mouth daily. Patient has not received her medication as yet.  INTERVAL HISTORY: Diane [name] 71 y.o. female returns to the clinic today for followup visit. The patient presents to discuss the results of her Veristrat test. The patient had a Veristrat test performed as a predictor of response to Tarceva therapy. Her Veristrat test came back with a good response indicating that she should have a good response to Tarceva therapy. Overall she is feeling well the exception of some shortness of breath with exertion.  She denied having any significant chest pain, cough or hemoptysis. She denied having any significant weight loss or night sweats.   MEDICAL HISTORY: Past Medical History  Diagnosis Date  . Hypertension   . Cancer     lung ca    ALLERGIES:   has no known allergies.  MEDICATIONS:  Current Outpatient Prescriptions  Medication Sig Dispense Refill  . acetaminophen (TYLENOL) 325 MG tablet Take 650 mg by mouth every 6 (six) hours as needed.        Marland Kitchen aspirin 81  MG tablet Take 81 mg by mouth daily.        . calcitRIOL (ROCALTROL) 0.25 MCG capsule Take 0.25 mcg by mouth every Monday, Wednesday, and Friday.      . erlotinib (TARCEVA) 150 MG tablet Take 1 tablet (150 mg total) by mouth daily. Take on an empty stomach 1 hour before meals or 2 hours after.  30 tablet  1  . labetalol (NORMODYNE) 200 MG tablet Take 200 mg by mouth 2 (two) times daily.        Marland Kitchen torsemide (DEMADEX) 20 MG tablet Take 20 mg by mouth daily.          SURGICAL HISTORY:  Past Surgical History  Procedure Date  . Portacath placement 11/03/08-[facility]    tip in mid SVC-E. Mansell    REVIEW OF SYSTEMS:  A comprehensive review of systems was negative except for: Respiratory: positive for dyspnea on exertion   PHYSICAL EXAMINATION: General appearance: alert, cooperative and no distress Head: Normocephalic, without obvious abnormality, atraumatic Neck: no adenopathy Lymph nodes: Cervical, supraclavicular, and axillary nodes normal. Resp: clear to auscultation bilaterally Cardio: regular rate and rhythm, S1, S2 normal, no murmur, click, rub or gallop GI: soft, non-tender; bowel sounds normal; no masses,  no organomegaly Extremities: extremities normal, atraumatic, no cyanosis or edema Neurologic: Alert and oriented X 3, normal strength and tone. Normal symmetric reflexes. Normal coordination and gait  ECOG PERFORMANCE STATUS: 1 - Symptomatic but completely ambulatory  Blood pressure 142/81, pulse 94, temperature 97.5 F (36.4 C), temperature source Oral, resp. rate 20,  height 5\' 6"  (1.676 m), weight 248 lb 12.8 oz (112.855 kg).  LABORATORY DATA: Lab Results  Component Value Date   WBC 5.0 08/13/2012   HGB 11.9 08/13/2012   HCT 35.9 08/13/2012   MCV 92.8 08/13/2012   PLT 157 08/13/2012      Chemistry      Component Value Date/Time   NA 139 08/13/2012 1110   NA 143 07/11/2012 0927   NA 134* 04/11/2012 0856   K 4.7 08/13/2012 1110   K 4.5 07/11/2012 0927   K 4.3 04/11/2012 0856    CL 105 08/13/2012 1110   CL 101 07/11/2012 0927   CL 99 04/11/2012 0856   CO2 26 08/13/2012 1110   CO2 29 07/11/2012 0927   CO2 26 04/11/2012 0856   BUN 25.0 08/13/2012 1110   BUN 16 07/11/2012 0927   BUN 22 04/11/2012 0856   CREATININE 1.6* 08/13/2012 1110   CREATININE 1.5* 07/11/2012 0927   CREATININE 1.72* 04/11/2012 0856      Component Value Date/Time   CALCIUM 9.9 08/13/2012 1110   CALCIUM 10.0 07/11/2012 0927   CALCIUM 9.0 04/11/2012 0856   ALKPHOS 81 08/13/2012 1110   ALKPHOS 71 07/11/2012 0927   ALKPHOS 79 04/11/2012 0856   AST 10 08/13/2012 1110   AST 19 07/11/2012 0927   AST 12 04/11/2012 0856   ALT 9 08/13/2012 1110   ALT 9 04/11/2012 0856   BILITOT 0.70 08/13/2012 1110   BILITOT 0.80 07/11/2012 0927   BILITOT 0.7 04/11/2012 0856       RADIOGRAPHIC STUDIES: No results found.  ASSESSMENT/PLAN: This is a very pleasant 71 years old white female with recurrent non-small cell lung cancer, adenocarcinoma with negative EGFR mutation and negative ALK gene translocation with a recent evidence for disease progression. She had a Veristrat test performed which came back with a good result, indicating that she should have a good response to Tarceva therapy. Patient was discussed with Dr. [name]. She has not received her Tarceva inject. She will call our office and notify us when she is received her medication and started taking the drug. She will followup in approximately 2 weeks for a symptom management visit with a repeat CBC differential and C. met.  Wynetta Emery, Keonia Pasko E, PA-C   All questions were answered. The patient knows to call the clinic with any problems, questions or concerns. We can certainly see the patient much sooner if necessary.  I spent 20 minutes counseling the patient face to face. The total time spent in the appointment was 30 minutes.

## 2012-08-27 NOTE — Telephone Encounter (Signed)
[facility] shipped tarceva 08/22/12

## 2012-09-09 ENCOUNTER — Other Ambulatory Visit (HOSPITAL_BASED_OUTPATIENT_CLINIC_OR_DEPARTMENT_OTHER): Payer: Medicare Other | Admitting: Lab

## 2012-09-09 ENCOUNTER — Telehealth: Payer: Self-pay | Admitting: Internal Medicine

## 2012-09-09 ENCOUNTER — Ambulatory Visit (HOSPITAL_BASED_OUTPATIENT_CLINIC_OR_DEPARTMENT_OTHER): Payer: Medicare Other | Admitting: Physician Assistant

## 2012-09-09 ENCOUNTER — Encounter: Payer: Self-pay | Admitting: Physician Assistant

## 2012-09-09 VITALS — BP 100/59 | HR 87 | Temp 97.5°F | Resp 20 | Ht 66.0 in | Wt 248.0 lb

## 2012-09-09 DIAGNOSIS — C3490 Malignant neoplasm of unspecified part of unspecified bronchus or lung: Secondary | ICD-10-CM

## 2012-09-09 DIAGNOSIS — C3430 Malignant neoplasm of lower lobe, unspecified bronchus or lung: Secondary | ICD-10-CM

## 2012-09-09 DIAGNOSIS — R21 Rash and other nonspecific skin eruption: Secondary | ICD-10-CM

## 2012-09-09 LAB — COMPREHENSIVE METABOLIC PANEL (CC13)
ALT: 9 U/L (ref 0–55)
AST: 12 U/L (ref 5–34)
Albumin: 3 g/dL — ABNORMAL LOW (ref 3.5–5.0)
BUN: 23 mg/dL (ref 7.0–26.0)
Calcium: 9.7 mg/dL (ref 8.4–10.4)
Chloride: 106 mEq/L (ref 98–107)
Potassium: 4 mEq/L (ref 3.5–5.1)
Sodium: 139 mEq/L (ref 136–145)
Total Protein: 5.7 g/dL — ABNORMAL LOW (ref 6.4–8.3)

## 2012-09-09 LAB — CBC WITH DIFFERENTIAL/PLATELET
BASO%: 0.5 % (ref 0.0–2.0)
Basophils Absolute: 0 10*3/uL (ref 0.0–0.1)
EOS%: 3 % (ref 0.0–7.0)
HGB: 11.8 g/dL (ref 11.6–15.9)
MCH: 31.3 pg (ref 25.1–34.0)
RDW: 14 % (ref 11.2–14.5)
lymph#: 0.5 10*3/uL — ABNORMAL LOW (ref 0.9–3.3)

## 2012-09-09 MED ORDER — CLINDAMYCIN PHOSPHATE 1 % EX SOLN: Freq: Two times a day (BID) | CUTANEOUS | Status: DC

## 2012-09-09 NOTE — Telephone Encounter (Signed)
gv pt appt schedule for October.  °

## 2012-09-09 NOTE — Patient Instructions (Addendum)
Continue Tarceva 150 mg by mouth daily Use clindamycin solution twice daily on the Tarceva related rash as needed Follow up in 2 weeks

## 2012-09-09 NOTE — Progress Notes (Signed)
[place] Telephone:([phone]) [phone]   Fax:(336) [phone]  OFFICE PROGRESS NOTE  [name], MD [address] [place] [address]  DIAGNOSIS: Recurrent non-small cell lung cancer, adenocarcinoma initially diagnosed as stage IIA in March 2008. The tumor cells were negative for EGFR mutation as well as ALK gene translocation.  PRIOR THERAPY:  1. Status post concurrent chemoradiation with weekly carboplatin and paclitaxel. Last dose was given Apr 12, 2007. 2. Status post 3 cycles of consolidation chemotherapy with docetaxel. Last dose was given July 12, 2007. 3. 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 4. Status post 39 cycles of maintenance Alimta at a dose of 500 mg/m2. Last dose was given February 25, 2010, discontinued secondary to persistent anemia, but the patient has stable disease.  CURRENT THERAPY: Tarceva 150 mg by mouth daily. Status post approximately 2 weeks of therapy  INTERVAL HISTORY: Diane [name] 71 y.o. female returns to the clinic today for followup visit. Thus far she is tolerating the Tarceva relatively well. She did develop a acneiform rash approximately one week after beginning the Tarceva. The rash is primarily concentrated on her face with the greatest concentration on her nose. She finds it irritating and itchy. She states that the [facility] nurse informed her that we would give her an antibiotic for the rash. She's not had any episodes of diarrhea and in fact has been somewhat constipated. She had a portion of the filling fall out of a left lower back tooth and may have to have this tooth pulled. She is appointment with her dentist later today. Overall she is feeling well the exception of some shortness of breath with exertion.  She denied having any significant chest pain, cough or hemoptysis. She denied having any significant weight loss or night sweats.   MEDICAL  HISTORY: Past Medical History  Diagnosis Date  . Hypertension   . Cancer     lung ca    ALLERGIES:   has no known allergies.  MEDICATIONS:  Current Outpatient Prescriptions  Medication Sig Dispense Refill  . acetaminophen (TYLENOL) 325 MG tablet Take 650 mg by mouth every 6 (six) hours as needed.        Marland Kitchen aspirin 81 MG tablet Take 81 mg by mouth daily.        . calcitRIOL (ROCALTROL) 0.25 MCG capsule Take 0.25 mcg by mouth every Monday, Wednesday, and Friday.      . clindamycin (CLEOCIN-T) 1 % external solution Apply topically 2 (two) times daily.  60 mL  1  . erlotinib (TARCEVA) 150 MG tablet Take 1 tablet (150 mg total) by mouth daily. Take on an empty stomach 1 hour before meals or 2 hours after.  30 tablet  1  . labetalol (NORMODYNE) 200 MG tablet Take 200 mg by mouth 2 (two) times daily.        Marland Kitchen torsemide (DEMADEX) 20 MG tablet Take 20 mg by mouth daily.          SURGICAL HISTORY:  Past Surgical History  Procedure Date  . Portacath placement 11/03/08-[facility]    tip in mid SVC-E. Mansell    REVIEW OF SYSTEMS:  A comprehensive review of systems was negative except for: Respiratory: positive for dyspnea on exertion Gastrointestinal: positive for constipation Grade 1-2 acneform skin rash related to Tarceva   PHYSICAL EXAMINATION: General appearance: alert, cooperative and no distress Head: Normocephalic, without obvious abnormality, atraumatic Neck: no adenopathy  Lymph nodes: Cervical, supraclavicular, and axillary nodes normal. Resp: clear to auscultation bilaterally Cardio: regular rate and rhythm, S1, S2 normal, no murmur, click, rub or gallop GI: soft, non-tender; bowel sounds normal; no masses,  no organomegaly Extremities: extremities normal, atraumatic, no cyanosis or edema Neurologic: Alert and oriented X 3, normal strength and tone. Normal symmetric reflexes. Normal coordination and gait Skin: Grade 1-2 acneform eruptions on the chin, cheeks and with the greatest  concentration on the nose. There is some eschar formation but no evidence of superinfection.  ECOG PERFORMANCE STATUS: 1 - Symptomatic but completely ambulatory  Blood pressure 100/59, pulse 87, temperature 97.5 F (36.4 C), temperature source Oral, resp. rate 20, height 5\' 6"  (1.676 m), weight 248 lb (112.492 kg).  LABORATORY DATA: Lab Results  Component Value Date   WBC 6.4 09/09/2012   HGB 11.8 09/09/2012   HCT 34.6* 09/09/2012   MCV 92.1 09/09/2012   PLT 162 09/09/2012      Chemistry      Component Value Date/Time   NA 139 08/13/2012 1110   NA 143 07/11/2012 0927   NA 134* 04/11/2012 0856   K 4.7 08/13/2012 1110   K 4.5 07/11/2012 0927   K 4.3 04/11/2012 0856   CL 105 08/13/2012 1110   CL 101 07/11/2012 0927   CL 99 04/11/2012 0856   CO2 26 08/13/2012 1110   CO2 29 07/11/2012 0927   CO2 26 04/11/2012 0856   BUN 25.0 08/13/2012 1110   BUN 16 07/11/2012 0927   BUN 22 04/11/2012 0856   CREATININE 1.6* 08/13/2012 1110   CREATININE 1.5* 07/11/2012 0927   CREATININE 1.72* 04/11/2012 0856      Component Value Date/Time   CALCIUM 9.9 08/13/2012 1110   CALCIUM 10.0 07/11/2012 0927   CALCIUM 9.0 04/11/2012 0856   ALKPHOS 81 08/13/2012 1110   ALKPHOS 71 07/11/2012 0927   ALKPHOS 79 04/11/2012 0856   AST 10 08/13/2012 1110   AST 19 07/11/2012 0927   AST 12 04/11/2012 0856   ALT 9 08/13/2012 1110   ALT 9 04/11/2012 0856   BILITOT 0.70 08/13/2012 1110   BILITOT 0.80 07/11/2012 0927   BILITOT 0.7 04/11/2012 0856       RADIOGRAPHIC STUDIES: No results found.  ASSESSMENT/PLAN: This is a very pleasant 71 years old white female with recurrent non-small cell lung cancer, adenocarcinoma with negative EGFR mutation and negative ALK gene translocation with a recent evidence for disease progression. She had a Veristrat test performed which came back with a good result, indicating that she should have a good response to Tarceva therapy. Patient was discussed with Dr. [name]. She is now status post  approximately 2 weeks of therapy with Tarceva 150 mg by mouth daily. She will continue on Tarceva at the current dose. For her grade 1-2 acneform rash associated with Tarceva prescription for clindamycin solution to be applied twice daily as needed was sent to her pharmacy of record via E. scribed. She'll return in 2 weeks with another CBC differential and C. met and a symptom management visit   Wynetta Emery, Bryer Cozzolino E, PA-C   All questions were answered. The patient knows to call the clinic with any problems, questions or concerns. We can certainly see the patient much sooner if necessary.  I spent 20 minutes counseling the patient face to face. The total time spent in the appointment was 30 minutes.

## 2012-09-19 ENCOUNTER — Encounter: Payer: Self-pay | Admitting: *Deleted

## 2012-09-19 NOTE — Progress Notes (Signed)
RECEIVED A FAX FROM [facility] CONCERNING A CONFIRMATION OF PRESCRIPTION SHIPMENT FOR [place] ON 09/18/12.

## 2012-09-23 ENCOUNTER — Encounter: Payer: Self-pay | Admitting: Physician Assistant

## 2012-09-23 ENCOUNTER — Telehealth: Payer: Self-pay | Admitting: Internal Medicine

## 2012-09-23 ENCOUNTER — Other Ambulatory Visit (HOSPITAL_BASED_OUTPATIENT_CLINIC_OR_DEPARTMENT_OTHER): Payer: Medicare Other | Admitting: Lab

## 2012-09-23 ENCOUNTER — Ambulatory Visit (HOSPITAL_BASED_OUTPATIENT_CLINIC_OR_DEPARTMENT_OTHER): Payer: Medicare Other | Admitting: Physician Assistant

## 2012-09-23 VITALS — BP 114/68 | HR 85 | Temp 97.4°F | Resp 20 | Ht 66.0 in | Wt 248.0 lb

## 2012-09-23 DIAGNOSIS — C3490 Malignant neoplasm of unspecified part of unspecified bronchus or lung: Secondary | ICD-10-CM

## 2012-09-23 DIAGNOSIS — C3410 Malignant neoplasm of upper lobe, unspecified bronchus or lung: Secondary | ICD-10-CM

## 2012-09-23 DIAGNOSIS — C3430 Malignant neoplasm of lower lobe, unspecified bronchus or lung: Secondary | ICD-10-CM

## 2012-09-23 DIAGNOSIS — R0602 Shortness of breath: Secondary | ICD-10-CM

## 2012-09-23 DIAGNOSIS — R21 Rash and other nonspecific skin eruption: Secondary | ICD-10-CM

## 2012-09-23 LAB — CBC WITH DIFFERENTIAL/PLATELET
BASO%: 1.2 % (ref 0.0–2.0)
EOS%: 5.4 % (ref 0.0–7.0)
MCH: 31.5 pg (ref 25.1–34.0)
MCHC: 34.3 g/dL (ref 31.5–36.0)
MONO%: 8.4 % (ref 0.0–14.0)
RBC: 3.94 10*6/uL (ref 3.70–5.45)
RDW: 14.5 % (ref 11.2–14.5)
lymph#: 0.6 10*3/uL — ABNORMAL LOW (ref 0.9–3.3)

## 2012-09-23 LAB — COMPREHENSIVE METABOLIC PANEL (CC13)
ALT: 13 U/L (ref 0–55)
AST: 15 U/L (ref 5–34)
Albumin: 3.2 g/dL — ABNORMAL LOW (ref 3.5–5.0)
Alkaline Phosphatase: 71 U/L (ref 40–150)
Calcium: 9.9 mg/dL (ref 8.4–10.4)
Chloride: 104 mEq/L (ref 98–107)
Creatinine: 1.5 mg/dL — ABNORMAL HIGH (ref 0.6–1.1)
Potassium: 3.9 mEq/L (ref 3.5–5.1)

## 2012-09-23 NOTE — Progress Notes (Signed)
[place] Telephone:([phone]   Fax:([phone]  OFFICE PROGRESS NOTE  [name], MD [address] [place] [address]  DIAGNOSIS: Recurrent non-small cell lung cancer, adenocarcinoma initially diagnosed as stage IIA in March 2008. The tumor cells were negative for EGFR mutation as well as ALK gene translocation.  PRIOR THERAPY:  1. Status post concurrent chemoradiation with weekly carboplatin and paclitaxel. Last dose was given Apr 12, 2007. 2. Status post 3 cycles of consolidation chemotherapy with docetaxel. Last dose was given July 12, 2007. 3. 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 4. Status post 39 cycles of maintenance Alimta at a dose of 500 mg/m2. Last dose was given February 25, 2010, discontinued secondary to persistent anemia, but the patient has stable disease.  CURRENT THERAPY: Tarceva 150 mg by mouth daily. Status post approximately one month of therapy  INTERVAL HISTORY: Diane [name] 71 y.o. female returns to the clinic today for followup visit. Thus far she is tolerating the Tarceva relatively well. She did develop a acneiform rash approximately one week after beginning the Tarceva. The rash is primarily concentrated on her face with the greatest concentration on her nose. She reports improvement in the rash and pruritic discomfort from the rash with the clindamycin solution. She has not developed any diarrhea and in fact has had some constipation. She has increased her MiraLAX from every other day to daily use with reasonable results. She reports that she will need a refill for her Tarceva through [facility] for her third month of Tarceva oh when she is closer to the end of her current second month of treatment. She voiced no other significant complaints. Overall she is feeling well the exception of some shortness of breath with exertion.  She denied having any  significant chest pain, cough or hemoptysis. She denied having any significant weight loss or night sweats.   MEDICAL HISTORY: Past Medical History  Diagnosis Date  . Hypertension   . Cancer     lung ca    ALLERGIES:   has no known allergies.  MEDICATIONS:  Current Outpatient Prescriptions  Medication Sig Dispense Refill  . acetaminophen (TYLENOL) 325 MG tablet Take 650 mg by mouth every 6 (six) hours as needed.        Marland Kitchen aspirin 81 MG tablet Take 81 mg by mouth daily.        . calcitRIOL (ROCALTROL) 0.25 MCG capsule Take 0.25 mcg by mouth every Monday, Wednesday, and Friday.      . clindamycin (CLEOCIN-T) 1 % external solution Apply topically 2 (two) times daily.  60 mL  1  . erlotinib (TARCEVA) 150 MG tablet Take 1 tablet (150 mg total) by mouth daily. Take on an empty stomach 1 hour before meals or 2 hours after.  30 tablet  1  . labetalol (NORMODYNE) 200 MG tablet Take 200 mg by mouth 2 (two) times daily.        Marland Kitchen torsemide (DEMADEX) 20 MG tablet Take 20 mg by mouth daily.          SURGICAL HISTORY:  Past Surgical History  Procedure Date  . Portacath placement 11/03/08-[facility]    tip in mid SVC-[name]. [facility]    REVIEW OF SYSTEMS:  A comprehensive review of systems was negative except for: Respiratory: positive for dyspnea on exertion Gastrointestinal: positive for constipation Grade 1 acneform skin rash related to Tarceva   PHYSICAL EXAMINATION: General  appearance: alert, cooperative and no distress Head: Normocephalic, without obvious abnormality, atraumatic Neck: no adenopathy Lymph nodes: Cervical, supraclavicular, and axillary nodes normal. Resp: clear to auscultation bilaterally Cardio: regular rate and rhythm, S1, S2 normal, no murmur, click, rub or gallop GI: soft, non-tender; bowel sounds normal; no masses,  no organomegaly Extremities: extremities normal, atraumatic, no cyanosis or edema Neurologic: Alert and oriented X 3, normal strength and tone. Normal symmetric  reflexes. Normal coordination and gait Skin: Grade 1 acneform eruptions on the chin, cheeks and with the greatest concentration on the nose. Skin is dry without evidence of superinfection   ECOG PERFORMANCE STATUS: 1 - Symptomatic but completely ambulatory  Blood pressure 114/68, pulse 85, temperature 97.4 F (36.3 C), temperature source Oral, resp. rate 20, height 5\' 6"  (1.676 m), weight 248 lb (112.492 kg).  LABORATORY DATA: Lab Results  Component Value Date   WBC 4.2 09/23/2012   HGB 12.4 09/23/2012   HCT 36.1 09/23/2012   MCV 91.7 09/23/2012   PLT 156 09/23/2012      Chemistry      Component Value Date/Time   NA 139 09/23/2012 0857   NA 143 07/11/2012 0927   NA 134* 04/11/2012 0856   K 3.9 09/23/2012 0857   K 4.5 07/11/2012 0927   K 4.3 04/11/2012 0856   CL 104 09/23/2012 0857   CL 101 07/11/2012 0927   CL 99 04/11/2012 0856   CO2 26 09/23/2012 0857   CO2 29 07/11/2012 0927   CO2 26 04/11/2012 0856   BUN 22.0 09/23/2012 0857   BUN 16 07/11/2012 0927   BUN 22 04/11/2012 0856   CREATININE 1.5* 09/23/2012 0857   CREATININE 1.5* 07/11/2012 0927   CREATININE 1.72* 04/11/2012 0856      Component Value Date/Time   CALCIUM 9.9 09/23/2012 0857   CALCIUM 10.0 07/11/2012 0927   CALCIUM 9.0 04/11/2012 0856   ALKPHOS 71 09/23/2012 0857   ALKPHOS 71 07/11/2012 0927   ALKPHOS 79 04/11/2012 0856   AST 15 09/23/2012 0857   AST 19 07/11/2012 0927   AST 12 04/11/2012 0856   ALT 13 09/23/2012 0857   ALT 9 04/11/2012 0856   BILITOT 1.30* 09/23/2012 0857   BILITOT 0.80 07/11/2012 0927   BILITOT 0.7 04/11/2012 0856       RADIOGRAPHIC STUDIES: No results found.  ASSESSMENT/PLAN: This is a very pleasant 71 years old white female with recurrent non-small cell lung cancer, adenocarcinoma with negative EGFR mutation and negative ALK gene translocation with a recent evidence for disease progression. She had a Veristrat test performed which came back with a good result, indicating that she should have  a good response to Tarceva therapy. Patient was discussed with Dr. [name]. She is now status post approximately one month of therapy with Tarceva 150 mg by mouth daily. She will continue on Tarceva at the current dose. She may continue the clindamycin solution as needed for symptomatic relief of her Tarceva-related rash. She'll return in one month with another CBC differential and C. met and a symptom management visit. We will be sure that she has adequate refill on available for her third month of therapy through [facility].  Diane [name], Diane Santalucia E, PA-C   All questions were answered. The patient knows to call the clinic with any problems, questions or concerns. We can certainly see the patient much sooner if necessary.  I spent 20 minutes counseling the patient face to face. The total time spent in the appointment was 30 minutes.

## 2012-09-23 NOTE — Patient Instructions (Addendum)
Continue taking Tarceva 150 mg by mouth daily Follow up in 1 month

## 2012-09-23 NOTE — Telephone Encounter (Signed)
Printed and gv pt appt schedule for NOV °

## 2012-09-24 ENCOUNTER — Other Ambulatory Visit: Payer: Self-pay | Admitting: *Deleted

## 2012-09-24 DIAGNOSIS — C3490 Malignant neoplasm of unspecified part of unspecified bronchus or lung: Secondary | ICD-10-CM

## 2012-09-24 MED ORDER — ERLOTINIB HCL 150 MG PO TABS: 150.0000 mg | ORAL_TABLET | Freq: Every day | ORAL | Status: DC

## 2012-10-01 ENCOUNTER — Ambulatory Visit (HOSPITAL_BASED_OUTPATIENT_CLINIC_OR_DEPARTMENT_OTHER): Payer: Medicare Other

## 2012-10-01 VITALS — BP 130/65 | HR 82 | Temp 97.7°F | Resp 20

## 2012-10-01 DIAGNOSIS — C3430 Malignant neoplasm of lower lobe, unspecified bronchus or lung: Secondary | ICD-10-CM

## 2012-10-01 DIAGNOSIS — Z452 Encounter for adjustment and management of vascular access device: Secondary | ICD-10-CM

## 2012-10-01 DIAGNOSIS — C3490 Malignant neoplasm of unspecified part of unspecified bronchus or lung: Secondary | ICD-10-CM

## 2012-10-01 MED ORDER — HEPARIN SOD (PORK) LOCK FLUSH 100 UNIT/ML IV SOLN
500.0000 [IU] | Freq: Once | INTRAVENOUS | Status: AC
  Administered 2012-10-01: 500 [IU] via INTRAVENOUS
  Filled 2012-10-01: qty 5

## 2012-10-01 MED ORDER — SODIUM CHLORIDE 0.9 % IJ SOLN
10.0000 mL | Freq: Once | INTRAMUSCULAR | Status: AC
  Administered 2012-10-01: 10 mL via INTRAVENOUS
  Filled 2012-10-01: qty 10

## 2012-10-16 NOTE — Telephone Encounter (Signed)
Rec'd fax from [facility] that [facility] was shipped on 10/15/12 for next day delivery.

## 2012-10-21 ENCOUNTER — Ambulatory Visit (HOSPITAL_BASED_OUTPATIENT_CLINIC_OR_DEPARTMENT_OTHER): Payer: Medicare Other | Admitting: Physician Assistant

## 2012-10-21 ENCOUNTER — Other Ambulatory Visit (HOSPITAL_BASED_OUTPATIENT_CLINIC_OR_DEPARTMENT_OTHER): Payer: Medicare Other | Admitting: Lab

## 2012-10-21 ENCOUNTER — Telehealth: Payer: Self-pay | Admitting: Internal Medicine

## 2012-10-21 VITALS — BP 119/66 | HR 88 | Temp 97.1°F | Resp 20 | Ht 66.0 in | Wt 247.1 lb

## 2012-10-21 DIAGNOSIS — Z85118 Personal history of other malignant neoplasm of bronchus and lung: Secondary | ICD-10-CM

## 2012-10-21 DIAGNOSIS — C384 Malignant neoplasm of pleura: Secondary | ICD-10-CM

## 2012-10-21 DIAGNOSIS — L270 Generalized skin eruption due to drugs and medicaments taken internally: Secondary | ICD-10-CM

## 2012-10-21 DIAGNOSIS — C3490 Malignant neoplasm of unspecified part of unspecified bronchus or lung: Secondary | ICD-10-CM

## 2012-10-21 DIAGNOSIS — T451X5A Adverse effect of antineoplastic and immunosuppressive drugs, initial encounter: Secondary | ICD-10-CM

## 2012-10-21 LAB — CBC WITH DIFFERENTIAL/PLATELET
BASO%: 0.8 % (ref 0.0–2.0)
Basophils Absolute: 0 10*3/uL (ref 0.0–0.1)
EOS%: 4.4 % (ref 0.0–7.0)
HCT: 34.9 % (ref 34.8–46.6)
MCH: 31.3 pg (ref 25.1–34.0)
MCHC: 34 g/dL (ref 31.5–36.0)
MCV: 92 fL (ref 79.5–101.0)
MONO%: 8.3 % (ref 0.0–14.0)
NEUT%: 76.8 % (ref 38.4–76.8)
lymph#: 0.6 10*3/uL — ABNORMAL LOW (ref 0.9–3.3)

## 2012-10-21 LAB — COMPREHENSIVE METABOLIC PANEL (CC13)
ALT: 10 U/L (ref 0–55)
AST: 14 U/L (ref 5–34)
Alkaline Phosphatase: 71 U/L (ref 40–150)
BUN: 18 mg/dL (ref 7.0–26.0)
Calcium: 9.5 mg/dL (ref 8.4–10.4)
Chloride: 104 mEq/L (ref 98–107)
Creatinine: 1.4 mg/dL — ABNORMAL HIGH (ref 0.6–1.1)
Total Bilirubin: 1.18 mg/dL (ref 0.20–1.20)

## 2012-10-21 NOTE — Telephone Encounter (Signed)
appts made and printed for pt pt aware cen/sch will call with scan appt    Diane [name]

## 2012-10-21 NOTE — Patient Instructions (Addendum)
Continue Tarceva 150 mg by mouth daily Follow up with Dr. [name] in 1 month with a restaging CT scan of your chest, abdomen and pelvis to re-evaluate your disease

## 2012-10-26 NOTE — Progress Notes (Signed)
[place] [place] [phone]) [phone]   Fax:(336) [phone]  OFFICE PROGRESS NOTE  [name], MD [address] [place] [address]  DIAGNOSIS: Recurrent non-small cell lung cancer, adenocarcinoma initially diagnosed as stage IIA in March 2008. The tumor cells were negative for EGFR mutation as well as ALK gene translocation.  PRIOR THERAPY:  1. Status post concurrent chemoradiation with weekly carboplatin and paclitaxel. Last dose was given Apr 12, 2007. 2. Status post 3 cycles of consolidation chemotherapy with docetaxel. Last dose was given July 12, 2007. 3. 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 4. Status post 39 cycles of maintenance Alimta at a dose of 500 mg/m2. Last dose was given February 25, 2010, discontinued secondary to persistent anemia, but the patient has stable disease.  CURRENT THERAPY: Tarceva 150 mg by mouth daily. Status post approximately 2 months of therapy  INTERVAL HISTORY: Diane [name] 71 y.o. female returns to the clinic today for followup visit. Thus far she is tolerating the Tarceva relatively well. Her rash has increased both on her face as well as anterior chest. She also has some eruptions at the inner elbows and behind the knees as well as on her legs. She also notes the beginning of a rash beneath her breasts. Her skin is generally dry. She's had a few more episodes of diarrhea.  She voiced no other significant complaints. Overall she is feeling well the exception of some shortness of breath with exertion.  She denied having any significant chest pain, cough or hemoptysis. She denied having any significant weight loss or night sweats.   MEDICAL HISTORY: Past Medical History  Diagnosis Date  . Hypertension   . Cancer     lung ca    ALLERGIES:   has no known allergies.  MEDICATIONS:  Current Outpatient Prescriptions  Medication Sig Dispense Refill    . acetaminophen (TYLENOL) 325 MG tablet Take 650 mg by mouth every 6 (six) hours as needed.        Marland Kitchen aspirin 81 MG tablet Take 81 mg by mouth daily.        . calcitRIOL (ROCALTROL) 0.25 MCG capsule Take 0.25 mcg by mouth every Monday, Wednesday, and Friday.      . clindamycin (CLEOCIN-T) 1 % external solution Apply topically 2 (two) times daily.  60 mL  1  . erlotinib (TARCEVA) 150 MG tablet Take 1 tablet (150 mg total) by mouth daily. Take on an empty stomach 1 hour before meals or 2 hours after.  30 tablet  1  . labetalol (NORMODYNE) 200 MG tablet Take 200 mg by mouth 2 (two) times daily.        Marland Kitchen torsemide (DEMADEX) 20 MG tablet Take 20 mg by mouth daily.          SURGICAL HISTORY:  Past Surgical History  Procedure Date  . Portacath placement 11/03/08-[facility]    tip in mid SVC-E. [facility]ell    REVIEW OF SYSTEMS:  A comprehensive review of systems was negative except for: Respiratory: positive for dyspnea on exertion Gastrointestinal: positive for constipation Grade 1 acneform skin rash related to Tarceva   PHYSICAL EXAMINATION: General appearance: alert, cooperative and no distress Head: Normocephalic, without obvious abnormality, atraumatic Neck: no adenopathy Lymph nodes: Cervical, supraclavicular, and axillary nodes normal. Resp: clear to auscultation bilaterally Cardio: regular rate and rhythm, S1, S2 normal, no murmur, click, rub or gallop GI: soft, non-tender; bowel sounds normal;  no masses,  no organomegaly Extremities: extremities normal, atraumatic, no cyanosis or edema Neurologic: Alert and oriented X 3, normal strength and tone. Normal symmetric reflexes. Normal coordination and gait Skin: Grade 1 acneform eruptions on the chin, cheeks, nose and anterior chest. There are also patches of similar eruptions at bilateral antecubital fossa and popliteal fossa. There is erythema beneath the breasts. Skin is dry without evidence of superinfection   ECOG PERFORMANCE STATUS: 1 -  Symptomatic but completely ambulatory  Blood pressure 119/66, pulse 88, temperature 97.1 F (36.2 C), temperature source Oral, resp. rate 20, height 5\' 6"  (1.676 m), weight 247 lb 1.6 oz (112.084 kg).  LABORATORY DATA: Lab Results  Component Value Date   WBC 5.8 10/21/2012   HGB 11.9 10/21/2012   HCT 34.9 10/21/2012   MCV 92.0 10/21/2012   PLT 196 10/21/2012      Chemistry      Component Value Date/Time   NA 140 10/21/2012 0950   NA 143 07/11/2012 0927   NA 134* 04/11/2012 0856   K 4.0 10/21/2012 0950   K 4.5 07/11/2012 0927   K 4.3 04/11/2012 0856   CL 104 10/21/2012 0950   CL 101 07/11/2012 0927   CL 99 04/11/2012 0856   CO2 30* 10/21/2012 0950   CO2 29 07/11/2012 0927   CO2 26 04/11/2012 0856   BUN 18.0 10/21/2012 0950   BUN 16 07/11/2012 0927   BUN 22 04/11/2012 0856   CREATININE 1.4* 10/21/2012 0950   CREATININE 1.5* 07/11/2012 0927   CREATININE 1.72* 04/11/2012 0856      Component Value Date/Time   CALCIUM 9.5 10/21/2012 0950   CALCIUM 10.0 07/11/2012 0927   CALCIUM 9.0 04/11/2012 0856   ALKPHOS 71 10/21/2012 0950   ALKPHOS 71 07/11/2012 0927   ALKPHOS 79 04/11/2012 0856   AST 14 10/21/2012 0950   AST 19 07/11/2012 0927   AST 12 04/11/2012 0856   ALT 10 10/21/2012 0950   ALT 9 04/11/2012 0856   BILITOT 1.18 10/21/2012 0950   BILITOT 0.80 07/11/2012 0927   BILITOT 0.7 04/11/2012 0856       RADIOGRAPHIC STUDIES: No results found.  ASSESSMENT/PLAN: This is a very pleasant 71 years old white female with recurrent non-small cell lung cancer, adenocarcinoma with negative EGFR mutation and negative ALK gene translocation with a recent evidence for disease progression. She had a Veristrat test performed which came back with a good result, indicating that she should have a good response to Tarceva therapy. Patient was discussed with Dr. [name]. She is now status post 2 months of therapy with Tarceva 150 mg by mouth daily. She will continue on Tarceva at the current dose. She may  continue the clindamycin solution as needed for symptomatic relief of her Tarceva-related rash. She is advised to keep the area beneath her breasts clean and dry.she'll followup Dr. [name] in one month for another CBC differential, C. met and CT of the chest, abdomen and pelvis without contrast to reevaluate her disease.   Wynetta Emery, Philomena Buttermore E, PA-C   All questions were answered. The patient knows to call the clinic with any problems, questions or concerns. We can certainly see the patient much sooner if necessary.  I spent 20 minutes counseling the patient face to face. The total time spent in the appointment was 30 minutes.

## 2012-11-12 ENCOUNTER — Encounter: Payer: Self-pay | Admitting: *Deleted

## 2012-11-12 NOTE — Progress Notes (Signed)
RECEIVED A FAX FROM [facility] CONCERNING A CONFIRMATION OF PRESCRIPTION SHIPMENT FOR [facility] ON 11/11/12.

## 2012-11-14 ENCOUNTER — Other Ambulatory Visit (HOSPITAL_BASED_OUTPATIENT_CLINIC_OR_DEPARTMENT_OTHER): Payer: Medicare Other

## 2012-11-14 ENCOUNTER — Ambulatory Visit (HOSPITAL_COMMUNITY)
Admission: RE | Admit: 2012-11-14 | Discharge: 2012-11-14 | Disposition: A | Discharge status: Home / Self Care | Payer: Medicare Other | Source: Ambulatory Visit | Attending: Physician Assistant | Admitting: Physician Assistant

## 2012-11-14 DIAGNOSIS — C3490 Malignant neoplasm of unspecified part of unspecified bronchus or lung: Secondary | ICD-10-CM | POA: Insufficient documentation

## 2012-11-14 DIAGNOSIS — M519 Unspecified thoracic, thoracolumbar and lumbosacral intervertebral disc disorder: Secondary | ICD-10-CM | POA: Insufficient documentation

## 2012-11-14 DIAGNOSIS — R911 Solitary pulmonary nodule: Secondary | ICD-10-CM | POA: Insufficient documentation

## 2012-11-14 DIAGNOSIS — C3410 Malignant neoplasm of upper lobe, unspecified bronchus or lung: Secondary | ICD-10-CM

## 2012-11-14 DIAGNOSIS — M479 Spondylosis, unspecified: Secondary | ICD-10-CM | POA: Insufficient documentation

## 2012-11-14 DIAGNOSIS — J90 Pleural effusion, not elsewhere classified: Secondary | ICD-10-CM | POA: Insufficient documentation

## 2012-11-14 LAB — CBC WITH DIFFERENTIAL/PLATELET
BASO%: 0.5 % (ref 0.0–2.0)
EOS%: 4.6 % (ref 0.0–7.0)
HCT: 35.5 % (ref 34.8–46.6)
MCH: 31.9 pg (ref 25.1–34.0)
MCHC: 34.6 g/dL (ref 31.5–36.0)
MONO#: 0.5 10*3/uL (ref 0.1–0.9)
NEUT%: 77 % — ABNORMAL HIGH (ref 38.4–76.8)
RBC: 3.86 10*6/uL (ref 3.70–5.45)
RDW: 14.6 % — ABNORMAL HIGH (ref 11.2–14.5)
WBC: 6.3 10*3/uL (ref 3.9–10.3)
lymph#: 0.6 10*3/uL — ABNORMAL LOW (ref 0.9–3.3)

## 2012-11-14 LAB — COMPREHENSIVE METABOLIC PANEL
ALT: 8 U/L (ref 0–35)
AST: 16 U/L (ref 0–37)
Calcium: 10.2 mg/dL (ref 8.4–10.5)
Chloride: 101 mEq/L (ref 96–112)
Creatinine, Ser: 1.52 mg/dL — ABNORMAL HIGH (ref 0.50–1.10)
Sodium: 137 mEq/L (ref 135–145)
Total Bilirubin: 1.2 mg/dL (ref 0.3–1.2)
Total Protein: 6.3 g/dL (ref 6.0–8.3)

## 2012-11-15 ENCOUNTER — Other Ambulatory Visit (HOSPITAL_COMMUNITY): Payer: Medicare Other

## 2012-11-18 ENCOUNTER — Telehealth: Payer: Self-pay | Admitting: Internal Medicine

## 2012-11-18 ENCOUNTER — Ambulatory Visit (HOSPITAL_BASED_OUTPATIENT_CLINIC_OR_DEPARTMENT_OTHER): Payer: Medicare Other

## 2012-11-18 ENCOUNTER — Encounter: Payer: Self-pay | Admitting: Internal Medicine

## 2012-11-18 ENCOUNTER — Ambulatory Visit (HOSPITAL_BASED_OUTPATIENT_CLINIC_OR_DEPARTMENT_OTHER): Payer: Medicare Other | Admitting: Internal Medicine

## 2012-11-18 VITALS — BP 93/57 | HR 92 | Temp 98.0°F | Resp 20 | Ht 66.0 in | Wt 244.1 lb

## 2012-11-18 VITALS — BP 138/68 | HR 87 | Temp 98.3°F

## 2012-11-18 DIAGNOSIS — C3490 Malignant neoplasm of unspecified part of unspecified bronchus or lung: Secondary | ICD-10-CM

## 2012-11-18 DIAGNOSIS — C3430 Malignant neoplasm of lower lobe, unspecified bronchus or lung: Secondary | ICD-10-CM

## 2012-11-18 MED ORDER — HEPARIN SOD (PORK) LOCK FLUSH 100 UNIT/ML IV SOLN
500.0000 [IU] | Freq: Once | INTRAVENOUS | Status: AC
  Administered 2012-11-18: 500 [IU] via INTRAVENOUS
  Filled 2012-11-18: qty 5

## 2012-11-18 MED ORDER — SODIUM CHLORIDE 0.9 % IJ SOLN
10.0000 mL | INTRAMUSCULAR | Status: DC | PRN
  Administered 2012-11-18: 10 mL via INTRAVENOUS
  Filled 2012-11-18: qty 10

## 2012-11-18 NOTE — Patient Instructions (Signed)
Call MD for problems 

## 2012-11-18 NOTE — Patient Instructions (Signed)
The scan showed mild disease progression. Will continue on Tarceva for now. Followup in one month

## 2012-11-18 NOTE — Telephone Encounter (Signed)
gv and printed appt schedule for pt for jan thru April 2014

## 2012-11-18 NOTE — Progress Notes (Signed)
[facility] [phone]   Fax:(336) [phone]  OFFICE PROGRESS NOTE  [name], MD [address] [place] [address]  DIAGNOSIS: Recurrent non-small cell lung cancer, adenocarcinoma initially diagnosed as stage IIA in March 2008. The tumor cells were negative for EGFR mutation as well as ALK gene translocation.   PRIOR THERAPY:  1. Status post concurrent chemoradiation with weekly carboplatin and paclitaxel. Last dose was given Apr 12, 2007. 2. Status post 3 cycles of consolidation chemotherapy with docetaxel. Last dose was given July 12, 2007. 3. 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 4. Status post 39 cycles of maintenance Alimta at a dose of 500 mg/m2. Last dose was given February 25, 2010, discontinued secondary to persistent anemia, but the patient has stable disease.  CURRENT THERAPY: Tarceva 150 mg by mouth daily. Status post approximately 3 months of therapy.  INTERVAL HISTORY: Diane [name] 71 y.o. female returns to the clinic today for routine followup visit. The patient is currently on treatment with Tarceva 150 mg by mouth daily for the last 3 months and she is tolerating it fairly well with no significant adverse effects except for very mild skin rash but no diarrhea. The patient denied having any significant weight loss or night sweats. She denied having any chest pain, shortness of breath except with exertion, no cough or hemoptysis. She had repeat CT scan of the chest, abdomen and pelvis performed recently and she is here for evaluation and discussion of her scan results.  MEDICAL HISTORY: Past Medical History  Diagnosis Date  . Hypertension   . Cancer     lung ca    ALLERGIES:   has no known allergies.  MEDICATIONS:  Current Outpatient Prescriptions  Medication Sig Dispense Refill  . acetaminophen (TYLENOL) 325 MG tablet Take 650 mg by mouth every  6 (six) hours as needed.        Marland Kitchen aspirin 81 MG tablet Take 81 mg by mouth daily.        . calcitRIOL (ROCALTROL) 0.25 MCG capsule Take 0.25 mcg by mouth every Monday, Wednesday, and Friday.      . clindamycin (CLEOCIN-T) 1 % external solution Apply topically 2 (two) times daily.  60 mL  1  . erlotinib (TARCEVA) 150 MG tablet Take 1 tablet (150 mg total) by mouth daily. Take on an empty stomach 1 hour before meals or 2 hours after.  30 tablet  1  . labetalol (NORMODYNE) 200 MG tablet Take 200 mg by mouth 2 (two) times daily.        Marland Kitchen torsemide (DEMADEX) 20 MG tablet Take 20 mg by mouth daily.          SURGICAL HISTORY:  Past Surgical History  Procedure Date  . Portacath placement 11/03/08-[facility]    tip in mid SVC-E. [facility]ell    REVIEW OF SYSTEMS:  A comprehensive review of systems was negative except for: Respiratory: positive for dyspnea on exertion   PHYSICAL EXAMINATION: General appearance: alert, cooperative and no distress Head: Normocephalic, without obvious abnormality, atraumatic Neck: no adenopathy Lymph nodes: Cervical, supraclavicular, and axillary nodes normal. Resp: clear to auscultation bilaterally Cardio: regular rate and rhythm, S1, S2 normal, no murmur, click, rub or gallop GI: soft, non-tender; bowel sounds normal; no masses,  no organomegaly Extremities: extremities normal, atraumatic, no cyanosis or edema Neurologic: Alert and oriented X 3, normal strength and tone. Normal symmetric reflexes. Normal coordination  and gait  ECOG PERFORMANCE STATUS: 1 - Symptomatic but completely ambulatory  Blood pressure 93/57, pulse 92, temperature 98 F (36.7 C), temperature source Oral, resp. rate 20, height 5\' 6"  (1.676 m), weight 244 lb 1.6 oz (110.723 kg).  LABORATORY DATA: Lab Results  Component Value Date   WBC 6.3 11/14/2012   HGB 12.3 11/14/2012   HCT 35.5 11/14/2012   MCV 92.0 11/14/2012   PLT 181 11/14/2012      Chemistry      Component Value Date/Time   NA  137 11/14/2012 0816   NA 140 10/21/2012 0950   NA 143 07/11/2012 0927   K 3.9 11/14/2012 0816   K 4.0 10/21/2012 0950   K 4.5 07/11/2012 0927   CL 101 11/14/2012 0816   CL 104 10/21/2012 0950   CL 101 07/11/2012 0927   CO2 28 11/14/2012 0816   CO2 30* 10/21/2012 0950   CO2 29 07/11/2012 0927   BUN 17 11/14/2012 0816   BUN 18.0 10/21/2012 0950   BUN 16 07/11/2012 0927   CREATININE 1.52* 11/14/2012 0816   CREATININE 1.4* 10/21/2012 0950   CREATININE 1.5* 07/11/2012 0927      Component Value Date/Time   CALCIUM 10.2 11/14/2012 0816   CALCIUM 9.5 10/21/2012 0950   CALCIUM 10.0 07/11/2012 0927   ALKPHOS 74 11/14/2012 0816   ALKPHOS 71 10/21/2012 0950   ALKPHOS 71 07/11/2012 0927   AST 16 11/14/2012 0816   AST 14 10/21/2012 0950   AST 19 07/11/2012 0927   ALT 8 11/14/2012 0816   ALT 10 10/21/2012 0950   BILITOT 1.2 11/14/2012 0816   BILITOT 1.18 10/21/2012 0950   BILITOT 0.80 07/11/2012 0927       RADIOGRAPHIC STUDIES: Ct Chest Wo Contrast  11/14/2012  *RADIOLOGY REPORT*  Clinical Data:  Recurrent non-small cell lung cancer, patient status post chemotherapy and radiotherapy. Patient currently on chemotherapy (Tarceva)  CT CHEST, ABDOMEN AND PELVIS WITHOUT CONTRAST  Technique:  Multidetector CT imaging of the chest, abdomen and pelvis was performed following the standard protocol without IV contrast.  Comparison:  CT 07/11/2012   CT CHEST  Findings:  No axillary or supraclavicular lymphadenopathy.  There is a port in the medial left chest wall.  No new mediastinal or hilar lymphadenopathy.  No pericardial fluid.  Esophagus is normal.  There is again demonstrated a partially loculated right pleural fluid collection at the right lung base.  The soft tissue density medially within this fluid collection is slightly increased in size measuring 4.6 x 3.6 cm compared to 3.6 x 3.3 cm on prior.  The density of this lesion is similar.  No IV contrast administered. Within the more lateral aspect of  this right pleural effusion is a ill-defined soft tissue which is similar (image 43).  There are multiple foci of ground-glass nodularity within the right lung which are not changed from prior.  No suspicious lesion in the right middle lobe is a rounded and measures 8 mm is similar to 8 mm on prior.  No clear new lesions are identified.  There is consolidation in the right infrahilar right lower lobe with air bronchograms which is similar to prior.  Left lung is clear.  IMPRESSION:  1.  Interval increase in size of soft tissue within the medial aspect of the right lower lobe, contained within a right pleural effusion,  is concerning for progression of recurrent lung cancer. Soft tissue density in the lateral aspect of this same right pleural effusion is  also concerning for recurrence.  2.  Irregular nodules with peripheral ground-glass opacities in the right upper lobe and right middle lobe are stable. 3.  Perihilar consolidation air bronchograms likely represents radiation change in the right infrahilar location   CT ABDOMEN AND PELVIS  Findings:  Non-IV contrast images demonstrate no focal hepatic lesion. Pancreas, spleen, adrenal glands, and kidneys are normal.  The stomach, small bowel, appendix, and cecum are normal.  The colon and rectosigmoid colon are normal.  Abdominal aorta normal caliber.  No retroperitoneal  or periportal lymphadenopathy.  No free fluid the pelvis.  The bladder and uterus are normal.  Review of the bone windows demonstrate multiple sclerotic endplate lesions which are unchanged from prior and likely a combination of degenerative disease and Schmorl's nodes.  IMPRESSION:  1.  No evidence of metastasis in the abdomen or pelvis. 2.  Multiple degenerative changes of the spine.   Original Report Authenticated By: Suzy Bouchard, M.D.     ASSESSMENT: This is a very pleasant 71 years old white female with recurrent non-small cell lung cancer, adenocarcinoma currently on treatment with  Tarceva 150 mg by mouth daily and tolerating it fairly well. The patient had stable disease in general but there is slight increase in the size of soft tissue within the medial aspect of the right lower lobe concerning for mild disease progression.  PLAN: I personally reviewed the scan images and discuss it with the patient today. It showed mild disease progression. I recommended for her to continue on the current treatment with Tarceva for 3 more months before repeating imaging studies for evaluation of her disease. She would come back for followup visit in one month's for reevaluation and management any adverse effect of her treatment. The patient was advised to call immediately if she has any concerning symptoms in the interval.  All questions were answered. The patient knows to call the clinic with any problems, questions or concerns. We can certainly see the patient much sooner if necessary.  I spent 15 minutes counseling the patient face to face. The total time spent in the appointment was 25 minutes.

## 2012-12-16 ENCOUNTER — Telehealth: Payer: Self-pay | Admitting: Internal Medicine

## 2012-12-16 ENCOUNTER — Other Ambulatory Visit (HOSPITAL_BASED_OUTPATIENT_CLINIC_OR_DEPARTMENT_OTHER): Payer: Medicare Other | Admitting: Lab

## 2012-12-16 ENCOUNTER — Ambulatory Visit (HOSPITAL_BASED_OUTPATIENT_CLINIC_OR_DEPARTMENT_OTHER): Payer: Medicare Other | Admitting: Physician Assistant

## 2012-12-16 ENCOUNTER — Other Ambulatory Visit: Payer: Self-pay | Admitting: *Deleted

## 2012-12-16 ENCOUNTER — Encounter: Payer: Self-pay | Admitting: Physician Assistant

## 2012-12-16 VITALS — BP 109/70 | HR 93 | Temp 97.9°F | Resp 20 | Ht 66.0 in | Wt 246.0 lb

## 2012-12-16 DIAGNOSIS — C3490 Malignant neoplasm of unspecified part of unspecified bronchus or lung: Secondary | ICD-10-CM

## 2012-12-16 DIAGNOSIS — C3430 Malignant neoplasm of lower lobe, unspecified bronchus or lung: Secondary | ICD-10-CM

## 2012-12-16 LAB — CBC WITH DIFFERENTIAL/PLATELET
BASO%: 1.2 % (ref 0.0–2.0)
EOS%: 4.1 % (ref 0.0–7.0)
HCT: 34.9 % (ref 34.8–46.6)
LYMPH%: 10.1 % — ABNORMAL LOW (ref 14.0–49.7)
MCH: 31.6 pg (ref 25.1–34.0)
MCHC: 34.6 g/dL (ref 31.5–36.0)
NEUT%: 74 % (ref 38.4–76.8)
Platelets: 164 10*3/uL (ref 145–400)
RBC: 3.81 10*6/uL (ref 3.70–5.45)

## 2012-12-16 LAB — COMPREHENSIVE METABOLIC PANEL (CC13)
ALT: 15 U/L (ref 0–55)
AST: 16 U/L (ref 5–34)
Alkaline Phosphatase: 62 U/L (ref 40–150)
Creatinine: 1.5 mg/dL — ABNORMAL HIGH (ref 0.6–1.1)
Total Bilirubin: 1.51 mg/dL — ABNORMAL HIGH (ref 0.20–1.20)

## 2012-12-16 MED ORDER — ERLOTINIB HCL 150 MG PO TABS: 150.0000 mg | ORAL_TABLET | Freq: Every day | ORAL | Status: DC

## 2012-12-16 NOTE — Patient Instructions (Addendum)
Continue taking Tarceva 150 mg by mouth daily Continue to live early use topical emollients for the dry skin associated with her Tarceva therapy Followup in one month for another symptom management visit

## 2012-12-16 NOTE — Telephone Encounter (Signed)
gv and printed appt schedule for pt for Feb..the patient aware

## 2012-12-16 NOTE — Progress Notes (Signed)
[place] [phone]   [phone]  OFFICE PROGRESS NOTE  Diane Downing, MD [address] [place] [address]  DIAGNOSIS: Recurrent non-small cell lung cancer, adenocarcinoma initially diagnosed as stage IIA in March 2008. The tumor cells were negative for EGFR mutation as well as ALK gene translocation.   PRIOR THERAPY:  1. Status post concurrent chemoradiation with weekly carboplatin and paclitaxel. Last dose was given Apr 12, 2007. 2. Status post 3 cycles of consolidation chemotherapy with docetaxel. Last dose was given July 12, 2007. 3. 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 4. Status post 39 cycles of maintenance Alimta at a dose of 500 mg/m2. Last dose was given February 25, 2010, discontinued secondary to persistent anemia, but the patient has stable disease.  CURRENT THERAPY: Tarceva 150 mg by mouth daily. Status post approximately 4 months of therapy.  INTERVAL HISTORY: Diane [name] 72 y.o. female returns to the clinic today for routine followup visit. The patient is currently on treatment with Tarceva 150 mg by mouth daily for the last 4 months and she is tolerating it fairly well with no significant adverse effects except for very mild skin rash but no diarrhea. She is noticing increased in dry skin. She's also noticed that her eyebrows have become black and coarse. She's also developed mustache hairs in chin hairs. She feels as though the hair on her head has not been growing much and seems to be thinning. She usually gets her hair cut every 4-6 weeks has not needed a hair cut since mid November of 2013. The patient denied having any significant weight loss or night sweats. She denied having any chest pain, shortness of breath except with exertion, no cough or hemoptysis.   MEDICAL HISTORY: Past Medical History  Diagnosis Date  . Hypertension   . Cancer      lung ca    ALLERGIES:   has no known allergies.  MEDICATIONS:  Current Outpatient Prescriptions  Medication Sig Dispense Refill  . acetaminophen (TYLENOL) 325 MG tablet Take 650 mg by mouth every 6 (six) hours as needed.        Marland Kitchen aspirin 81 MG tablet Take 81 mg by mouth daily.        . calcitRIOL (ROCALTROL) 0.25 MCG capsule Take 0.25 mcg by mouth every Monday, Wednesday, and Friday.      . clindamycin (CLEOCIN-T) 1 % external solution Apply topically 2 (two) times daily.  60 mL  1  . erlotinib (TARCEVA) 150 MG tablet Take 1 tablet (150 mg total) by mouth daily. Take on an empty stomach 1 hour before meals or 2 hours after.  30 tablet  1  . labetalol (NORMODYNE) 200 MG tablet Take 200 mg by mouth 2 (two) times daily.        Marland Kitchen torsemide (DEMADEX) 20 MG tablet Take 20 mg by mouth daily.          SURGICAL HISTORY:  Past Surgical History  Procedure Date  . Portacath placement 11/03/08-[facility]    tip in mid SVC-[name]. [facility]    REVIEW OF SYSTEMS:  Pertinent items are noted in HPI.   PHYSICAL EXAMINATION: General appearance: alert, cooperative and no distress Head: Normocephalic, without obvious abnormality, atraumatic Neck: no adenopathy Lymph nodes: Cervical, supraclavicular, and axillary nodes normal. Resp: clear to auscultation bilaterally Cardio: regular rate and rhythm, S1, S2 normal, no murmur, click, rub or gallop GI: soft,  non-tender; bowel sounds normal; no masses,  no organomegaly Extremities: extremities normal, atraumatic, no cyanosis or edema Neurologic: Alert and oriented X 3, normal strength and tone. Normal symmetric reflexes. Normal coordination and gait  ECOG PERFORMANCE STATUS: 1 - Symptomatic but completely ambulatory  Blood pressure 109/70, pulse 93, temperature 97.9 F (36.6 C), temperature source Oral, resp. rate 20, height 5\' 6"  (1.676 m), weight 246 lb (111.585 kg).  LABORATORY DATA: Lab Results  Component Value Date   WBC 5.5 12/16/2012   HGB 12.1  12/16/2012   HCT 34.9 12/16/2012   MCV 91.5 12/16/2012   PLT 164 12/16/2012      Chemistry      Component Value Date/Time   NA 139 12/16/2012 0908   NA 137 11/14/2012 0816   NA 143 07/11/2012 0927   K 3.7 12/16/2012 0908   K 3.9 11/14/2012 0816   K 4.5 07/11/2012 0927   CL 103 12/16/2012 0908   CL 101 11/14/2012 0816   CL 101 07/11/2012 0927   CO2 28 12/16/2012 0908   CO2 28 11/14/2012 0816   CO2 29 07/11/2012 0927   BUN 19.0 12/16/2012 0908   BUN 17 11/14/2012 0816   BUN 16 07/11/2012 0927   CREATININE 1.5* 12/16/2012 0908   CREATININE 1.52* 11/14/2012 0816   CREATININE 1.5* 07/11/2012 0927      Component Value Date/Time   CALCIUM 9.9 12/16/2012 0908   CALCIUM 10.2 11/14/2012 0816   CALCIUM 10.0 07/11/2012 0927   ALKPHOS 62 12/16/2012 0908   ALKPHOS 74 11/14/2012 0816   ALKPHOS 71 07/11/2012 0927   AST 16 12/16/2012 0908   AST 16 11/14/2012 0816   AST 19 07/11/2012 0927   ALT 15 12/16/2012 0908   ALT 8 11/14/2012 0816   BILITOT 1.51* 12/16/2012 0908   BILITOT 1.2 11/14/2012 0816   BILITOT 0.80 07/11/2012 0927       RADIOGRAPHIC STUDIES: Ct Chest Wo Contrast  11/14/2012  *RADIOLOGY REPORT*  Clinical Data:  Recurrent non-small cell lung cancer, patient status post chemotherapy and radiotherapy. Patient currently on chemotherapy (Tarceva)  CT CHEST, ABDOMEN AND PELVIS WITHOUT CONTRAST  Technique:  Multidetector CT imaging of the chest, abdomen and pelvis was performed following the standard protocol without IV contrast.  Comparison:  CT 07/11/2012   CT CHEST  Findings:  No axillary or supraclavicular lymphadenopathy.  There is a port in the medial left chest wall.  No new mediastinal or hilar lymphadenopathy.  No pericardial fluid.  Esophagus is normal.  There is again demonstrated a partially loculated right pleural fluid collection at the right lung base.  The soft tissue density medially within this fluid collection is slightly increased in size measuring 4.6 x 3.6 cm compared to 3.6 x 3.3  cm on prior.  The density of this lesion is similar.  No IV contrast administered. Within the more lateral aspect of this right pleural effusion is a ill-defined soft tissue which is similar (image 43).  There are multiple foci of ground-glass nodularity within the right lung which are not changed from prior.  No suspicious lesion in the right middle lobe is a rounded and measures 8 mm is similar to 8 mm on prior.  No clear new lesions are identified.  There is consolidation in the right infrahilar right lower lobe with air bronchograms which is similar to prior.  Left lung is clear.  IMPRESSION:  1.  Interval increase in size of soft tissue within the medial aspect of the right lower  lobe, contained within a right pleural effusion,  is concerning for progression of recurrent lung cancer. Soft tissue density in the lateral aspect of this same right pleural effusion is also concerning for recurrence.  2.  Irregular nodules with peripheral ground-glass opacities in the right upper lobe and right middle lobe are stable. 3.  Perihilar consolidation air bronchograms likely represents radiation change in the right infrahilar location   CT ABDOMEN AND PELVIS  Findings:  Non-IV contrast images demonstrate no focal hepatic lesion. Pancreas, spleen, adrenal glands, and kidneys are normal.  The stomach, small bowel, appendix, and cecum are normal.  The colon and rectosigmoid colon are normal.  Abdominal aorta normal caliber.  No retroperitoneal  or periportal lymphadenopathy.  No free fluid the pelvis.  The bladder and uterus are normal.  Review of the bone windows demonstrate multiple sclerotic endplate lesions which are unchanged from prior and likely a combination of degenerative disease and Schmorl's nodes.  IMPRESSION:  1.  No evidence of metastasis in the abdomen or pelvis. 2.  Multiple degenerative changes of the spine.   Original Report Authenticated By: Suzy Bouchard, M.D.     ASSESSMENT/PLAN: This is a very  pleasant 72 years old white female with recurrent non-small cell lung cancer, adenocarcinoma currently on treatment with Tarceva 150 mg by mouth daily and tolerating it fairly well. The patient had stable disease in general but there is slight increase in the size of soft tissue within the medial aspect of the right lower lobe concerning for mild disease progression. Patient was discussed with Dr. [name]. She will continue on Tarceva 150 mg by mouth daily. She is encouraged to use topical emollients liberally concerning her dry skin related to the Tarceva therapy. The changes in her hair also likely related to the Tarceva. She will continue to monitor these changes as well. She'll followup one month with repeat CBC differential and C. met for another symptom management visit. She does need a refill for her Tarceva we will work with [facility] regarding this.  Diane [name], Diane Heinlen E, PA-C   All questions were answered. The patient knows to call the clinic with any problems, questions or concerns. We can certainly see the patient much sooner if necessary.  I spent 20 minutes counseling the patient face to face. The total time spent in the appointment was 30 minutes.

## 2012-12-16 NOTE — Addendum Note (Signed)
Addended by: Wyonia Hough on: 12/16/2012 04:17 PM   Modules accepted: Orders

## 2012-12-16 NOTE — Telephone Encounter (Signed)
RECEIVED A REFILL REQUEST FOR TARCEVA. THIS REQUEST WAS GIVEN TO DR.MOHAMED'S NURSE, STEPHANIE JOHNSON,RN.

## 2012-12-18 NOTE — Telephone Encounter (Signed)
Rec'd confirmation fax that Tarceva was shipped on 12/17/12 for next day delivery.

## 2012-12-30 ENCOUNTER — Ambulatory Visit (HOSPITAL_BASED_OUTPATIENT_CLINIC_OR_DEPARTMENT_OTHER): Payer: Medicare Other

## 2012-12-30 VITALS — BP 109/68 | HR 92 | Temp 97.9°F

## 2012-12-30 DIAGNOSIS — Z452 Encounter for adjustment and management of vascular access device: Secondary | ICD-10-CM

## 2012-12-30 DIAGNOSIS — C3490 Malignant neoplasm of unspecified part of unspecified bronchus or lung: Secondary | ICD-10-CM

## 2012-12-30 DIAGNOSIS — C3430 Malignant neoplasm of lower lobe, unspecified bronchus or lung: Secondary | ICD-10-CM

## 2012-12-30 MED ORDER — HEPARIN SOD (PORK) LOCK FLUSH 100 UNIT/ML IV SOLN
500.0000 [IU] | Freq: Once | INTRAVENOUS | Status: AC
  Administered 2012-12-30: 500 [IU] via INTRAVENOUS
  Filled 2012-12-30: qty 5

## 2012-12-30 MED ORDER — SODIUM CHLORIDE 0.9 % IJ SOLN
10.0000 mL | INTRAMUSCULAR | Status: DC | PRN
  Administered 2012-12-30: 10 mL via INTRAVENOUS
  Filled 2012-12-30: qty 10

## 2013-01-13 ENCOUNTER — Other Ambulatory Visit (HOSPITAL_BASED_OUTPATIENT_CLINIC_OR_DEPARTMENT_OTHER): Payer: Medicare Other | Admitting: Lab

## 2013-01-13 ENCOUNTER — Telehealth: Payer: Self-pay | Admitting: Internal Medicine

## 2013-01-13 ENCOUNTER — Encounter: Payer: Self-pay | Admitting: Physician Assistant

## 2013-01-13 ENCOUNTER — Ambulatory Visit (HOSPITAL_BASED_OUTPATIENT_CLINIC_OR_DEPARTMENT_OTHER): Payer: Medicare Other | Admitting: Physician Assistant

## 2013-01-13 VITALS — BP 119/58 | HR 90 | Temp 97.8°F | Resp 20 | Ht 66.0 in | Wt 244.0 lb

## 2013-01-13 DIAGNOSIS — J910 Malignant pleural effusion: Secondary | ICD-10-CM

## 2013-01-13 DIAGNOSIS — C3490 Malignant neoplasm of unspecified part of unspecified bronchus or lung: Secondary | ICD-10-CM

## 2013-01-13 DIAGNOSIS — C3430 Malignant neoplasm of lower lobe, unspecified bronchus or lung: Secondary | ICD-10-CM

## 2013-01-13 LAB — CBC WITH DIFFERENTIAL/PLATELET
BASO%: 0.7 % (ref 0.0–2.0)
EOS%: 4.5 % (ref 0.0–7.0)
HCT: 34.2 % — ABNORMAL LOW (ref 34.8–46.6)
LYMPH%: 11.3 % — ABNORMAL LOW (ref 14.0–49.7)
MCH: 31.7 pg (ref 25.1–34.0)
MCHC: 34.6 g/dL (ref 31.5–36.0)
MCV: 91.8 fL (ref 79.5–101.0)
MONO#: 0.5 10*3/uL (ref 0.1–0.9)
NEUT%: 73.4 % (ref 38.4–76.8)
Platelets: 152 10*3/uL (ref 145–400)

## 2013-01-13 LAB — COMPREHENSIVE METABOLIC PANEL (CC13)
ALT: 13 U/L (ref 0–55)
CO2: 30 mEq/L — ABNORMAL HIGH (ref 22–29)
Creatinine: 1.5 mg/dL — ABNORMAL HIGH (ref 0.6–1.1)
Total Bilirubin: 1.42 mg/dL — ABNORMAL HIGH (ref 0.20–1.20)

## 2013-01-13 NOTE — Patient Instructions (Addendum)
Continue taking Tarceva 150 mg by mouth daily Follow with Dr. [name] in one month with a restaging CT scan of your chest, abdomen and pelvis without contrast to reevaluate your disease

## 2013-01-13 NOTE — Telephone Encounter (Signed)
Gave pt appt for lab , Ct then MD on March 2014

## 2013-01-13 NOTE — Progress Notes (Signed)
[place] [phone]   Fax:(336) [phone]  OFFICE PROGRESS NOTE  [name], MD [address] [place] [address]  DIAGNOSIS: Recurrent non-small cell lung cancer, adenocarcinoma initially diagnosed as stage IIA in March 2008. The tumor cells were negative for EGFR mutation as well as ALK gene translocation.   PRIOR THERAPY:  1. Status post concurrent chemoradiation with weekly carboplatin and paclitaxel. Last dose was given Apr 12, 2007. 2. Status post 3 cycles of consolidation chemotherapy with docetaxel. Last dose was given July 12, 2007. 3. 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 4. Status post 39 cycles of maintenance Alimta at a dose of 500 mg/m2. Last dose was given February 25, 2010, discontinued secondary to persistent anemia, but the patient has stable disease.  CURRENT THERAPY: Tarceva 150 mg by mouth daily. Status post approximately 5 months of therapy.  INTERVAL HISTORY: Diane [name] 72 y.o. female returns to the clinic today for routine followup visit. The patient is currently on treatment with Tarceva 150 mg by mouth daily for the last 4 months and she is tolerating it fairly well with no significant adverse effects except for very mild skin rash but no diarrhea. She continues to have dry skin. She's also noticed that her eyelashes are growing longer.  The patient denied having any significant weight loss or night sweats. She denied having any chest pain, shortness of breath except with exertion, no cough or hemoptysis.   MEDICAL HISTORY: Past Medical History  Diagnosis Date  . Hypertension   . Cancer     lung ca    ALLERGIES:  has No Known Allergies.  MEDICATIONS:  Current Outpatient Prescriptions  Medication Sig Dispense Refill  . acetaminophen (TYLENOL) 325 MG tablet Take 650 mg by mouth every 6 (six) hours as needed.        Marland Kitchen aspirin 81 MG  tablet Take 81 mg by mouth daily.        . calcitRIOL (ROCALTROL) 0.25 MCG capsule Take 0.25 mcg by mouth every Monday, Wednesday, and Friday.      . clindamycin (CLEOCIN-T) 1 % external solution Apply topically 2 (two) times daily.  60 mL  1  . erlotinib (TARCEVA) 150 MG tablet Take 1 tablet (150 mg total) by mouth daily. Take on an empty stomach 1 hour before meals or 2 hours after.  30 tablet  1  . labetalol (NORMODYNE) 200 MG tablet Take 200 mg by mouth 2 (two) times daily.        Marland Kitchen torsemide (DEMADEX) 20 MG tablet Take 20 mg by mouth daily.         No current facility-administered medications for this visit.    SURGICAL HISTORY:  Past Surgical History  Procedure Laterality Date  . Portacath placement  11/03/08-[facility]    tip in mid SVC-E. [facility]ell    REVIEW OF SYSTEMS:  Pertinent items are noted in HPI.   PHYSICAL EXAMINATION: General appearance: alert, cooperative and no distress Head: Normocephalic, without obvious abnormality, atraumatic Neck: no adenopathy Lymph nodes: Cervical, supraclavicular, and axillary nodes normal. Resp: clear to auscultation bilaterally Cardio: regular rate and rhythm, S1, S2 normal, no murmur, click, rub or gallop GI: soft, non-tender; bowel sounds normal; no masses,  no organomegaly Extremities: extremities normal, atraumatic, no cyanosis or edema Neurologic: Alert and oriented X 3, normal strength and tone. Normal symmetric reflexes. Normal coordination and gait  ECOG PERFORMANCE STATUS: 1 -  Symptomatic but completely ambulatory  Blood pressure 119/58, pulse 90, temperature 97.8 F (36.6 C), temperature source Oral, resp. rate 20, height 5\' 6"  (1.676 m), weight 244 lb (110.678 kg).  LABORATORY DATA: Lab Results  Component Value Date   WBC 5.0 01/13/2013   HGB 11.8 01/13/2013   HCT 34.2* 01/13/2013   MCV 91.8 01/13/2013   PLT 152 01/13/2013      Chemistry      Component Value Date/Time   NA 142 01/13/2013 0913   NA 137 11/14/2012 0816   NA  143 07/11/2012 0927   K 3.9 01/13/2013 0913   K 3.9 11/14/2012 0816   K 4.5 07/11/2012 0927   CL 104 01/13/2013 0913   CL 101 11/14/2012 0816   CL 101 07/11/2012 0927   CO2 30* 01/13/2013 0913   CO2 28 11/14/2012 0816   CO2 29 07/11/2012 0927   BUN 19.8 01/13/2013 0913   BUN 17 11/14/2012 0816   BUN 16 07/11/2012 0927   CREATININE 1.5* 01/13/2013 0913   CREATININE 1.52* 11/14/2012 0816   CREATININE 1.5* 07/11/2012 0927      Component Value Date/Time   CALCIUM 10.0 01/13/2013 0913   CALCIUM 10.2 11/14/2012 0816   CALCIUM 10.0 07/11/2012 0927   ALKPHOS 64 01/13/2013 0913   ALKPHOS 74 11/14/2012 0816   ALKPHOS 71 07/11/2012 0927   AST 13 01/13/2013 0913   AST 16 11/14/2012 0816   AST 19 07/11/2012 0927   ALT 13 01/13/2013 0913   ALT 8 11/14/2012 0816   BILITOT 1.42* 01/13/2013 0913   BILITOT 1.2 11/14/2012 0816   BILITOT 0.80 07/11/2012 0927       RADIOGRAPHIC STUDIES: Ct Chest Wo Contrast  11/14/2012  *RADIOLOGY REPORT*  Clinical Data:  Recurrent non-small cell lung cancer, patient status post chemotherapy and radiotherapy. Patient currently on chemotherapy (Tarceva)  CT CHEST, ABDOMEN AND PELVIS WITHOUT CONTRAST  Technique:  Multidetector CT imaging of the chest, abdomen and pelvis was performed following the standard protocol without IV contrast.  Comparison:  CT 07/11/2012   CT CHEST  Findings:  No axillary or supraclavicular lymphadenopathy.  There is a port in the medial left chest wall.  No new mediastinal or hilar lymphadenopathy.  No pericardial fluid.  Esophagus is normal.  There is again demonstrated a partially loculated right pleural fluid collection at the right lung base.  The soft tissue density medially within this fluid collection is slightly increased in size measuring 4.6 x 3.6 cm compared to 3.6 x 3.3 cm on prior.  The density of this lesion is similar.  No IV contrast administered. Within the more lateral aspect of this right pleural effusion is a ill-defined soft tissue which  is similar (image 43).  There are multiple foci of ground-glass nodularity within the right lung which are not changed from prior.  No suspicious lesion in the right middle lobe is a rounded and measures 8 mm is similar to 8 mm on prior.  No clear new lesions are identified.  There is consolidation in the right infrahilar right lower lobe with air bronchograms which is similar to prior.  Left lung is clear.  IMPRESSION:  1.  Interval increase in size of soft tissue within the medial aspect of the right lower lobe, contained within a right pleural effusion,  is concerning for progression of recurrent lung cancer. Soft tissue density in the lateral aspect of this same right pleural effusion is also concerning for recurrence.  2.  Irregular nodules with  peripheral ground-glass opacities in the right upper lobe and right middle lobe are stable. 3.  Perihilar consolidation air bronchograms likely represents radiation change in the right infrahilar location   CT ABDOMEN AND PELVIS  Findings:  Non-IV contrast images demonstrate no focal hepatic lesion. Pancreas, spleen, adrenal glands, and kidneys are normal.  The stomach, small bowel, appendix, and cecum are normal.  The colon and rectosigmoid colon are normal.  Abdominal aorta normal caliber.  No retroperitoneal  or periportal lymphadenopathy.  No free fluid the pelvis.  The bladder and uterus are normal.  Review of the bone windows demonstrate multiple sclerotic endplate lesions which are unchanged from prior and likely a combination of degenerative disease and Schmorl's nodes.  IMPRESSION:  1.  No evidence of metastasis in the abdomen or pelvis. 2.  Multiple degenerative changes of the spine.   Original Report Authenticated By: Suzy Bouchard, M.D.     ASSESSMENT/PLAN: This is a very pleasant 72 years old white female with recurrent non-small cell lung cancer, adenocarcinoma currently on treatment with Tarceva 150 mg by mouth daily and tolerating it fairly well.  The patient had stable disease in general but there is slight increase in the size of soft tissue within the medial aspect of the right lower lobe concerning for mild disease progression on her last restaging CT scan. Patient was discussed with Dr. [name]. She will continue on Tarceva 150 mg by mouth daily. We'll continue to monitor her creatinine level and total bilirubin. She'll followup with Dr. [name] in one month with a repeat CBC differential, C. met and CT of the chest, abdomen and pelvis with contrast to reevaluate her disease.   Wynetta Emery, Braydan Marriott E, PA-C   All questions were answered. The patient knows to call the clinic with any problems, questions or concerns. We can certainly see the patient much sooner if necessary.  I spent 20 minutes counseling the patient face to face. The total time spent in the appointment was 30 minutes.

## 2013-01-15 NOTE — Telephone Encounter (Signed)
Rec'd confirmation fax that refill of Tarceva was shipped on 01/14/13 for next day delivery.

## 2013-02-06 ENCOUNTER — Other Ambulatory Visit (HOSPITAL_COMMUNITY): Payer: Medicare Other

## 2013-02-10 ENCOUNTER — Encounter (HOSPITAL_COMMUNITY): Payer: Self-pay

## 2013-02-10 ENCOUNTER — Ambulatory Visit (HOSPITAL_COMMUNITY)
Admission: RE | Admit: 2013-02-10 | Discharge: 2013-02-10 | Disposition: A | Discharge status: Home / Self Care | Payer: Medicare Other | Source: Ambulatory Visit | Attending: Physician Assistant | Admitting: Physician Assistant

## 2013-02-10 ENCOUNTER — Other Ambulatory Visit (HOSPITAL_BASED_OUTPATIENT_CLINIC_OR_DEPARTMENT_OTHER): Payer: Medicare Other | Admitting: Lab

## 2013-02-10 ENCOUNTER — Ambulatory Visit: Payer: Medicare Other

## 2013-02-10 VITALS — BP 152/92 | HR 88 | Temp 97.8°F | Resp 20

## 2013-02-10 DIAGNOSIS — C3490 Malignant neoplasm of unspecified part of unspecified bronchus or lung: Secondary | ICD-10-CM

## 2013-02-10 DIAGNOSIS — Z9221 Personal history of antineoplastic chemotherapy: Secondary | ICD-10-CM | POA: Insufficient documentation

## 2013-02-10 DIAGNOSIS — Z923 Personal history of irradiation: Secondary | ICD-10-CM | POA: Insufficient documentation

## 2013-02-10 DIAGNOSIS — C3430 Malignant neoplasm of lower lobe, unspecified bronchus or lung: Secondary | ICD-10-CM

## 2013-02-10 DIAGNOSIS — J90 Pleural effusion, not elsewhere classified: Secondary | ICD-10-CM | POA: Insufficient documentation

## 2013-02-10 HISTORY — DX: Malignant neoplasm of parotid gland: C07

## 2013-02-10 LAB — CBC WITH DIFFERENTIAL/PLATELET
Basophils Absolute: 0 10*3/uL (ref 0.0–0.1)
Eosinophils Absolute: 0.3 10*3/uL (ref 0.0–0.5)
HCT: 36.6 % (ref 34.8–46.6)
LYMPH%: 10.3 % — ABNORMAL LOW (ref 14.0–49.7)
MCHC: 34.7 g/dL (ref 31.5–36.0)
MONO#: 0.5 10*3/uL (ref 0.1–0.9)
NEUT%: 75.9 % (ref 38.4–76.8)
Platelets: 183 10*3/uL (ref 145–400)
WBC: 6 10*3/uL (ref 3.9–10.3)

## 2013-02-10 LAB — COMPREHENSIVE METABOLIC PANEL (CC13)
BUN: 16.2 mg/dL (ref 7.0–26.0)
CO2: 28 mEq/L (ref 22–29)
Creatinine: 1.5 mg/dL — ABNORMAL HIGH (ref 0.6–1.1)
Glucose: 108 mg/dl — ABNORMAL HIGH (ref 70–99)
Total Bilirubin: 1.4 mg/dL — ABNORMAL HIGH (ref 0.20–1.20)

## 2013-02-10 MED ORDER — HEPARIN SOD (PORK) LOCK FLUSH 100 UNIT/ML IV SOLN
500.0000 [IU] | Freq: Once | INTRAVENOUS | Status: AC
  Administered 2013-02-10: 500 [IU] via INTRAVENOUS
  Filled 2013-02-10: qty 5

## 2013-02-10 MED ORDER — IOHEXOL 300 MG/ML  SOLN
50.0000 mL | Freq: Once | INTRAMUSCULAR | Status: AC | PRN
  Administered 2013-02-10: 50 mL via ORAL

## 2013-02-10 MED ORDER — SODIUM CHLORIDE 0.9 % IJ SOLN
10.0000 mL | INTRAMUSCULAR | Status: DC | PRN
  Administered 2013-02-10: 10 mL via INTRAVENOUS
  Filled 2013-02-10: qty 10

## 2013-02-10 NOTE — Patient Instructions (Signed)
portacath flushed with normal saline and heparin, be sure to have port flushed every 6-8 weeks.

## 2013-02-12 ENCOUNTER — Telehealth: Payer: Self-pay | Admitting: Internal Medicine

## 2013-02-12 ENCOUNTER — Encounter: Payer: Self-pay | Admitting: Internal Medicine

## 2013-02-12 ENCOUNTER — Ambulatory Visit (HOSPITAL_BASED_OUTPATIENT_CLINIC_OR_DEPARTMENT_OTHER): Payer: Medicare Other | Admitting: Internal Medicine

## 2013-02-12 VITALS — BP 112/67 | HR 86 | Temp 96.8°F | Resp 20 | Ht 66.0 in | Wt 241.7 lb

## 2013-02-12 DIAGNOSIS — C3490 Malignant neoplasm of unspecified part of unspecified bronchus or lung: Secondary | ICD-10-CM

## 2013-02-12 DIAGNOSIS — C3430 Malignant neoplasm of lower lobe, unspecified bronchus or lung: Secondary | ICD-10-CM

## 2013-02-12 DIAGNOSIS — J910 Malignant pleural effusion: Secondary | ICD-10-CM

## 2013-02-12 MED ORDER — ERLOTINIB HCL 150 MG PO TABS: 150.0000 mg | ORAL_TABLET | Freq: Every day | ORAL | Status: DC

## 2013-02-12 NOTE — Telephone Encounter (Signed)
Gave pt appt for lab and ML on April 2014

## 2013-02-12 NOTE — Progress Notes (Signed)
[place] Telephone:(336) [phone]   Fax:(336) [phone]  OFFICE PROGRESS NOTE  [name], MD [address] [place] [address]  DIAGNOSIS: Recurrent non-small cell lung cancer, adenocarcinoma initially diagnosed as stage IIA in March 2008. The tumor cells were negative for EGFR mutation as well as ALK gene translocation.   PRIOR THERAPY:  1. Status post concurrent chemoradiation with weekly carboplatin and paclitaxel. Last dose was given Apr 12, 2007. 2. Status post 3 cycles of consolidation chemotherapy with docetaxel. Last dose was given July 12, 2007. 3. 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 4. Status post 39 cycles of maintenance Alimta at a dose of 500 mg/m2. Last dose was given February 25, 2010, discontinued secondary to persistent anemia, but the patient has stable disease.  CURRENT THERAPY: Tarceva 150 mg by mouth daily. Status post approximately 6 months of therapy.  INTERVAL HISTORY: Diane [name] 72 y.o. female returns to the clinic today for followup visit. The patient is feeling fine today with no specific complaints. She is tolerating her treatment was Tarceva fairly well with no significant adverse effects except for very mild skin rash on the nose. She denied having any significant diarrhea. She denied having any chest pain, shortness breath, cough or hemoptysis. She had repeat CT scan of the chest, abdomen and pelvis performed recently and she is here for evaluation and discussion of her scan results.  MEDICAL HISTORY: Past Medical History  Diagnosis Date  . Hypertension   . Renal insufficiency     stage 3 renal disease/ pt  . lung ca dx'd 02/2010  . 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  MEDICATIONS:  Current Outpatient Prescriptions  Medication Sig Dispense Refill  . aspirin 81 MG tablet Take 81 mg by mouth  daily.        . calcitRIOL (ROCALTROL) 0.25 MCG capsule Take 0.25 mcg by mouth every Monday, Wednesday, and Friday.      . clindamycin (CLEOCIN-T) 1 % external solution Apply topically 2 (two) times daily.  60 mL  1  . erlotinib (TARCEVA) 150 MG tablet Take 1 tablet (150 mg total) by mouth daily. Take on an empty stomach 1 hour before meals or 2 hours after.  30 tablet  1  . labetalol (NORMODYNE) 200 MG tablet Take 200 mg by mouth 2 (two) times daily.        Marland Kitchen torsemide (DEMADEX) 20 MG tablet Take 20 mg by mouth daily.        Marland Kitchen acetaminophen (TYLENOL) 325 MG tablet Take 650 mg by mouth every 6 (six) hours as needed.         No current facility-administered medications for this visit.    SURGICAL HISTORY:  Past Surgical History  Procedure Laterality Date  . Portacath placement  11/03/08-[facility]    tip in mid SVC-E. Mansell    REVIEW OF SYSTEMS:  A comprehensive review of systems was negative.   PHYSICAL EXAMINATION: General appearance: alert, cooperative and no distress Head: Normocephalic, without obvious abnormality, atraumatic Neck: no adenopathy Lymph nodes: Cervical, supraclavicular, and axillary nodes normal. Resp: clear to auscultation bilaterally Back: symmetric, no curvature. ROM normal. No CVA tenderness. Cardio: regular rate and rhythm, S1, S2 normal, no murmur, click, rub or gallop GI: soft, non-tender; bowel sounds normal; no masses,  no organomegaly Extremities: extremities normal, atraumatic, no cyanosis or edema Neurologic: Alert  and oriented X 3, normal strength and tone. Normal symmetric reflexes. Normal coordination and gait  ECOG PERFORMANCE STATUS: 1 - Symptomatic but completely ambulatory  Blood pressure 112/67, pulse 86, temperature 96.8 F (36 C), temperature source Oral, resp. rate 20, height 5\' 6"  (1.676 m), weight 241 lb 11.2 oz (109.634 kg).  LABORATORY DATA: Lab Results  Component Value Date   WBC 6.0 02/10/2013   HGB 12.7 02/10/2013   HCT 36.6  02/10/2013   MCV 91.7 02/10/2013   PLT 183 02/10/2013      Chemistry      Component Value Date/Time   NA 138 02/10/2013 0921   NA 137 11/14/2012 0816   NA 143 07/11/2012 0927   K 3.9 02/10/2013 0921   K 3.9 11/14/2012 0816   K 4.5 07/11/2012 0927   CL 101 02/10/2013 0921   CL 101 11/14/2012 0816   CL 101 07/11/2012 0927   CO2 28 02/10/2013 0921   CO2 28 11/14/2012 0816   CO2 29 07/11/2012 0927   BUN 16.2 02/10/2013 0921   BUN 17 11/14/2012 0816   BUN 16 07/11/2012 0927   CREATININE 1.5* 02/10/2013 0921   CREATININE 1.52* 11/14/2012 0816   CREATININE 1.5* 07/11/2012 0927      Component Value Date/Time   CALCIUM 10.3 02/10/2013 0921   CALCIUM 10.2 11/14/2012 0816   CALCIUM 10.0 07/11/2012 0927   ALKPHOS 68 02/10/2013 0921   ALKPHOS 74 11/14/2012 0816   ALKPHOS 71 07/11/2012 0927   AST 17 02/10/2013 0921   AST 16 11/14/2012 0816   AST 19 07/11/2012 0927   ALT 11 02/10/2013 0921   ALT 8 11/14/2012 0816   BILITOT 1.40* 02/10/2013 0921   BILITOT 1.2 11/14/2012 0816   BILITOT 0.80 07/11/2012 0927       RADIOGRAPHIC STUDIES: Ct Chest Wo Contrast  02/10/2013  *RADIOLOGY REPORT*  Clinical Data:  Follow-up lung cancer.  Chemotherapy and radiation therapy complete.  CT CHEST, ABDOMEN AND PELVIS WITHOUT CONTRAST  Technique:  Multidetector CT imaging of the chest, abdomen and pelvis was performed following the standard protocol without IV contrast.  Comparison:   CT 11/14/2012.   CT CHEST  Findings:  Soft tissue density in the medial aspect of the right pleural effusion and measures 4.0 x 4.4 cm not changed from 4.0 x 4.4 cm on prior remeasured.  Less well defined density in the lateral aspect of the same right lower lobe pleural effusion is grossly.  Review of the lung parenchyma demonstrates scattered stable nodular ground-glass opacities.  There is atelectasis and air bronchograms in the right lower lobe unchanged.  Left lung is relatively clear.  Non-IV contrast to review the mediastinum demonstrates  peribronchial thickening in the right hilum.  This is stable. There is no mediastinal enlarged lymph nodes.  No axillary supraclavicular nodes.  There is a port in the left active chest wall.  IMPRESSION:  1.  Stable nodular thickening in the medial aspect of the right pleural effusion remains worrisome for carcinoma recurrence. 2.  Essentially stable soft tissue within the lateral aspect of the same effusion which is less well defined. 3.  Stable nodular ground-glass opacities in the right lung.   CT ABDOMEN AND PELVIS  Findings:  Non-IV contrast images demonstrate no focal hepatic lesion.  The pancreas, spleen, adrenal glands, and kidneys are normal.  The stomach, small bowel, and colon are normal.  Abdominal aorta normal caliber.  No retroperitoneal or  periportal lymphadenopathy.  No free fluid the pelvis.  No pelvic lymphadenopathy.  The uterus and ovaries are normal.  Review of  bone windows demonstrates no aggressive osseous lesions.  IMPRESSION: No evidence metastasis in the abdomen or pelvis.   Original Report Authenticated By: Suzy Bouchard, M.D.     ASSESSMENT: This is a very pleasant 72 years old white female with metastatic non-small cell lung cancer currently on treatment with Tarceva status post 6 months. The patient has no evidence for disease progression on his recent scan.  PLAN: I discussed the scan results with the patient today. I recommended for her to continue treatment with Tarceva with the same dose 150 mg by mouth daily. She would come back for followup visit in one month. She was advised to call immediately if she has any concerning symptoms in the interval.  All questions were answered. The patient knows to call the clinic with any problems, questions or concerns. We can certainly see the patient much sooner if necessary.  I spent 15 minutes counseling the patient face to face. The total time spent in the appointment was 25 minutes.

## 2013-02-12 NOTE — Patient Instructions (Addendum)
Your scan showed no evidence for disease progression. Continue treatment with Tarceva Followup in one month.

## 2013-02-17 ENCOUNTER — Encounter: Payer: Self-pay | Admitting: *Deleted

## 2013-02-17 NOTE — Progress Notes (Signed)
RECEIVED A FAX FROM [facility]CERNING A CONFIRMATION OF PRESCRIPTION SHIPMENT FOR [place] ON 02/14/13.

## 2013-03-11 ENCOUNTER — Telehealth: Payer: Self-pay | Admitting: Internal Medicine

## 2013-03-11 ENCOUNTER — Other Ambulatory Visit (HOSPITAL_BASED_OUTPATIENT_CLINIC_OR_DEPARTMENT_OTHER): Payer: Medicare Other | Admitting: Lab

## 2013-03-11 ENCOUNTER — Ambulatory Visit (HOSPITAL_BASED_OUTPATIENT_CLINIC_OR_DEPARTMENT_OTHER): Payer: Medicare Other | Admitting: Physician Assistant

## 2013-03-11 ENCOUNTER — Ambulatory Visit: Payer: Medicare Other

## 2013-03-11 VITALS — BP 112/65 | HR 88 | Temp 97.6°F | Resp 20 | Ht 66.0 in | Wt 244.0 lb

## 2013-03-11 DIAGNOSIS — C3490 Malignant neoplasm of unspecified part of unspecified bronchus or lung: Secondary | ICD-10-CM

## 2013-03-11 DIAGNOSIS — R21 Rash and other nonspecific skin eruption: Secondary | ICD-10-CM

## 2013-03-11 DIAGNOSIS — L989 Disorder of the skin and subcutaneous tissue, unspecified: Secondary | ICD-10-CM

## 2013-03-11 DIAGNOSIS — C3430 Malignant neoplasm of lower lobe, unspecified bronchus or lung: Secondary | ICD-10-CM

## 2013-03-11 LAB — CBC WITH DIFFERENTIAL/PLATELET
Eosinophils Absolute: 0.2 10*3/uL (ref 0.0–0.5)
HCT: 33.7 % — ABNORMAL LOW (ref 34.8–46.6)
LYMPH%: 12.1 % — ABNORMAL LOW (ref 14.0–49.7)
MONO#: 0.4 10*3/uL (ref 0.1–0.9)
NEUT#: 3.6 10*3/uL (ref 1.5–6.5)
NEUT%: 73.5 % (ref 38.4–76.8)
Platelets: 171 10*3/uL (ref 145–400)
WBC: 4.9 10*3/uL (ref 3.9–10.3)
lymph#: 0.6 10*3/uL — ABNORMAL LOW (ref 0.9–3.3)

## 2013-03-11 LAB — COMPREHENSIVE METABOLIC PANEL (CC13)
CO2: 28 mEq/L (ref 22–29)
Calcium: 9.5 mg/dL (ref 8.4–10.4)
Chloride: 103 mEq/L (ref 98–107)
Creatinine: 1.7 mg/dL — ABNORMAL HIGH (ref 0.6–1.1)
Glucose: 100 mg/dl — ABNORMAL HIGH (ref 70–99)
Sodium: 139 mEq/L (ref 136–145)
Total Bilirubin: 1.24 mg/dL — ABNORMAL HIGH (ref 0.20–1.20)
Total Protein: 5.9 g/dL — ABNORMAL LOW (ref 6.4–8.3)

## 2013-03-11 MED ORDER — SODIUM CHLORIDE 0.9 % IJ SOLN
10.0000 mL | INTRAMUSCULAR | Status: DC | PRN
  Administered 2013-03-11: 10 mL via INTRAVENOUS
  Filled 2013-03-11: qty 10

## 2013-03-11 MED ORDER — HEPARIN SOD (PORK) LOCK FLUSH 100 UNIT/ML IV SOLN
500.0000 [IU] | Freq: Once | INTRAVENOUS | Status: AC
  Administered 2013-03-11: 500 [IU] via INTRAVENOUS
  Filled 2013-03-11: qty 5

## 2013-03-11 MED ORDER — DOXYCYCLINE HYCLATE 100 MG PO TABS: 100.0000 mg | ORAL_TABLET | Freq: Two times a day (BID) | ORAL | Status: DC

## 2013-03-11 NOTE — Telephone Encounter (Signed)
gv and printed appt sched. and avs for pt from May thru Dec...  pt got flush TODAY

## 2013-03-11 NOTE — Patient Instructions (Addendum)
Continue taking Tarceva 150 mg by mouth daily He may use a dandruff shampoo for your scalp and also use the clindamycin solution and apply to her scalp twice daily Take the course of doxycycline as prescribed Followup with Dr. [name] in one month for a symptom management visit

## 2013-03-14 NOTE — Progress Notes (Signed)
[place] Telephone:(336) [phone]   Fax:(336) [phone]  OFFICE PROGRESS NOTE  [name], MD [address] [place] [address]  DIAGNOSIS: Recurrent non-small cell lung cancer, adenocarcinoma initially diagnosed as stage IIA in March 2008. The tumor cells were negative for EGFR mutation as well as ALK gene translocation.   PRIOR THERAPY:  1. Status post concurrent chemoradiation with weekly carboplatin and paclitaxel. Last dose was given Apr 12, 2007. 2. Status post 3 cycles of consolidation chemotherapy with docetaxel. Last dose was given July 12, 2007. 3. 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 4. Status post 39 cycles of maintenance Alimta at a dose of 500 mg/m2. Last dose was given February 25, 2010, discontinued secondary to persistent anemia, but the patient has stable disease.  CURRENT THERAPY: Tarceva 150 mg by mouth daily. Status post approximately 7 months of therapy.  INTERVAL HISTORY: Diane [name] 72 y.o. female returns to the clinic today for followup visit. The patient is feeling fine today. She complains of some skin lesions on her scalp related to the Tarceva.  She is otherwise tolerating her treatment was Tarceva fairly well with no significant adverse effects except for very mild skin rash on the nose. She denied having any significant diarrhea. She denied having any chest pain, shortness breath, cough or hemoptysis.   MEDICAL HISTORY: Past Medical History  Diagnosis Date  . Hypertension   . Renal insufficiency     stage 3 renal disease/ pt  . lung ca dx'd 02/2010  . 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  MEDICATIONS:  Current Outpatient Prescriptions  Medication Sig Dispense Refill  . acetaminophen (TYLENOL) 325 MG tablet Take 650 mg by mouth every 6 (six) hours as needed.        Marland Kitchen aspirin 81 MG tablet Take  81 mg by mouth daily.        . calcitRIOL (ROCALTROL) 0.25 MCG capsule Take 0.25 mcg by mouth every Monday, Wednesday, and Friday.      . clindamycin (CLEOCIN-T) 1 % external solution Apply topically 2 (two) times daily.  60 mL  1  . doxycycline (VIBRA-TABS) 100 MG tablet Take 1 tablet (100 mg total) by mouth 2 (two) times daily.  14 tablet  0  . erlotinib (TARCEVA) 150 MG tablet Take 1 tablet (150 mg total) by mouth daily. Take on an empty stomach 1 hour before meals or 2 hours after.  30 tablet  2  . labetalol (NORMODYNE) 200 MG tablet Take 200 mg by mouth 2 (two) times daily.        Marland Kitchen torsemide (DEMADEX) 20 MG tablet Take 20 mg by mouth daily.         No current facility-administered medications for this visit.    SURGICAL HISTORY:  Past Surgical History  Procedure Laterality Date  . Portacath placement  11/03/08-[facility]    tip in mid SVC-E. Mansell    REVIEW OF SYSTEMS:  Pertinent items are noted in HPI.   PHYSICAL EXAMINATION: General appearance: alert, cooperative and no distress Head: Normocephalic, without obvious abnormality, atraumatic Neck: no adenopathy Lymph nodes: Cervical, supraclavicular, and axillary nodes normal. Resp: clear to auscultation bilaterally Back: symmetric, no curvature. ROM normal. No CVA tenderness. Cardio: regular rate and rhythm, S1, S2 normal, no murmur, click, rub or gallop GI: soft, non-tender; bowel sounds normal; no masses,  no organomegaly  Extremities: extremities normal, atraumatic, no cyanosis or edema Neurologic: Alert and oriented X 3, normal strength and tone. Normal symmetric reflexes. Normal coordination and gait Examination of the scalp reveals scattered acneiform/pustular type lesions some with eschar formation, no evidence of superinfection  ECOG PERFORMANCE STATUS: 1 - Symptomatic but completely ambulatory  Blood pressure 112/65, pulse 88, temperature 97.6 F (36.4 C), temperature source Oral, resp. rate 20, height 5\' 6"  (1.676 m),  weight 244 lb (110.678 kg).  LABORATORY DATA: Lab Results  Component Value Date   WBC 4.9 03/11/2013   HGB 11.3* 03/11/2013   HCT 33.7* 03/11/2013   MCV 91.9 03/11/2013   PLT 171 03/11/2013      Chemistry      Component Value Date/Time   NA 139 03/11/2013 1004   NA 137 11/14/2012 0816   NA 143 07/11/2012 0927   K 3.7 03/11/2013 1004   K 3.9 11/14/2012 0816   K 4.5 07/11/2012 0927   CL 103 03/11/2013 1004   CL 101 11/14/2012 0816   CL 101 07/11/2012 0927   CO2 28 03/11/2013 1004   CO2 28 11/14/2012 0816   CO2 29 07/11/2012 0927   BUN 20.2 03/11/2013 1004   BUN 17 11/14/2012 0816   BUN 16 07/11/2012 0927   CREATININE 1.7* 03/11/2013 1004   CREATININE 1.52* 11/14/2012 0816   CREATININE 1.5* 07/11/2012 0927      Component Value Date/Time   CALCIUM 9.5 03/11/2013 1004   CALCIUM 10.2 11/14/2012 0816   CALCIUM 10.0 07/11/2012 0927   ALKPHOS 62 03/11/2013 1004   ALKPHOS 74 11/14/2012 0816   ALKPHOS 71 07/11/2012 0927   AST 15 03/11/2013 1004   AST 16 11/14/2012 0816   AST 19 07/11/2012 0927   ALT 10 03/11/2013 1004   ALT 8 11/14/2012 0816   BILITOT 1.24* 03/11/2013 1004   BILITOT 1.2 11/14/2012 0816   BILITOT 0.80 07/11/2012 0927       RADIOGRAPHIC STUDIES: Ct Chest Wo Contrast  02/10/2013  *RADIOLOGY REPORT*  Clinical Data:  Follow-up lung cancer.  Chemotherapy and radiation therapy complete.  CT CHEST, ABDOMEN AND PELVIS WITHOUT CONTRAST  Technique:  Multidetector CT imaging of the chest, abdomen and pelvis was performed following the standard protocol without IV contrast.  Comparison:   CT 11/14/2012.   CT CHEST  Findings:  Soft tissue density in the medial aspect of the right pleural effusion and measures 4.0 x 4.4 cm not changed from 4.0 x 4.4 cm on prior remeasured.  Less well defined density in the lateral aspect of the same right lower lobe pleural effusion is grossly.  Review of the lung parenchyma demonstrates scattered stable nodular ground-glass opacities.  There is atelectasis and  air bronchograms in the right lower lobe unchanged.  Left lung is relatively clear.  Non-IV contrast to review the mediastinum demonstrates peribronchial thickening in the right hilum.  This is stable. There is no mediastinal enlarged lymph nodes.  No axillary supraclavicular nodes.  There is a port in the left active chest wall.  IMPRESSION:  1.  Stable nodular thickening in the medial aspect of the right pleural effusion remains worrisome for carcinoma recurrence. 2.  Essentially stable soft tissue within the lateral aspect of the same effusion which is less well defined. 3.  Stable nodular ground-glass opacities in the right lung.   CT ABDOMEN AND PELVIS  Findings:  Non-IV contrast images demonstrate no focal hepatic lesion.  The pancreas, spleen, adrenal glands, and kidneys are normal.  The  stomach, small bowel, and colon are normal.  Abdominal aorta normal caliber.  No retroperitoneal or  periportal lymphadenopathy.  No free fluid the pelvis.  No pelvic lymphadenopathy.  The uterus and ovaries are normal.  Review of  bone windows demonstrates no aggressive osseous lesions.  IMPRESSION: No evidence metastasis in the abdomen or pelvis.   Original Report Authenticated By: Suzy Bouchard, M.D.     ASSESSMENT/PLAN: This is a very pleasant 72 years old white female with metastatic non-small cell lung cancer currently on treatment with Tarceva status post 6 months. The patient has no evidence for disease progression on her recent scan. Patient was discussed with Dr. [name]. She will continue on Tarceva at 150 mg by mouth daily. Regarding her scalp lesions that are consistent with the Tarceva therapy. She may use the clindamycin solution on her scalp twice daily, we will also place her on a seven-day course of doxycycline at 100 mg by mouth twice daily. She's also advised to use a dandruff shampoo. She'll followup with Dr. [name] in one month for another symptom management visit with repeat CBC differential and  C. met.  Diane [name], Arnell Mausolf E, PA-C   All questions were answered. The patient knows to call the clinic with any problems, questions or concerns. We can certainly see the patient much sooner if necessary.  I spent 20 minutes counseling the patient face to face. The total time spent in the appointment was 30 minutes.

## 2013-04-08 ENCOUNTER — Other Ambulatory Visit (HOSPITAL_BASED_OUTPATIENT_CLINIC_OR_DEPARTMENT_OTHER): Payer: Medicare Other

## 2013-04-08 ENCOUNTER — Encounter: Payer: Self-pay | Admitting: Internal Medicine

## 2013-04-08 ENCOUNTER — Telehealth: Payer: Self-pay | Admitting: Internal Medicine

## 2013-04-08 ENCOUNTER — Ambulatory Visit (HOSPITAL_BASED_OUTPATIENT_CLINIC_OR_DEPARTMENT_OTHER): Payer: Medicare Other | Admitting: Internal Medicine

## 2013-04-08 VITALS — BP 118/70 | HR 84 | Temp 97.3°F | Resp 18 | Ht 66.0 in | Wt 240.5 lb

## 2013-04-08 DIAGNOSIS — C3490 Malignant neoplasm of unspecified part of unspecified bronchus or lung: Secondary | ICD-10-CM

## 2013-04-08 LAB — COMPREHENSIVE METABOLIC PANEL (CC13)
ALT: 11 U/L (ref 0–55)
AST: 15 U/L (ref 5–34)
Albumin: 2.6 g/dL — ABNORMAL LOW (ref 3.5–5.0)
CO2: 29 mEq/L (ref 22–29)
Calcium: 9.5 mg/dL (ref 8.4–10.4)
Chloride: 103 mEq/L (ref 98–107)
Potassium: 3.8 mEq/L (ref 3.5–5.1)
Sodium: 140 mEq/L (ref 136–145)
Total Protein: 6 g/dL — ABNORMAL LOW (ref 6.4–8.3)

## 2013-04-08 LAB — CBC WITH DIFFERENTIAL/PLATELET
BASO%: 0.8 % (ref 0.0–2.0)
HCT: 34.3 % — ABNORMAL LOW (ref 34.8–46.6)
MCHC: 34.1 g/dL (ref 31.5–36.0)
MONO#: 0.6 10*3/uL (ref 0.1–0.9)
NEUT%: 73.6 % (ref 38.4–76.8)
RBC: 3.75 10*6/uL (ref 3.70–5.45)
RDW: 14.4 % (ref 11.2–14.5)
WBC: 5.8 10*3/uL (ref 3.9–10.3)
lymph#: 0.6 10*3/uL — ABNORMAL LOW (ref 0.9–3.3)

## 2013-04-08 NOTE — Patient Instructions (Signed)
Continue treatment with Tarceva. Follow up visit in one month with repeat CT scan of the chest, abdomen and pelvis.

## 2013-04-08 NOTE — Progress Notes (Signed)
[place] Telephone:(336) [phone]   Fax:(336) [phone]  OFFICE PROGRESS NOTE  [name], MD [address] [place] [address]  DIAGNOSIS: Recurrent non-small cell lung cancer, adenocarcinoma initially diagnosed as stage IIA in March 2008. The tumor cells were negative for EGFR mutation as well as ALK gene translocation.   PRIOR THERAPY:  1. Status post concurrent chemoradiation with weekly carboplatin and paclitaxel. Last dose was given Apr 12, 2007. 2. Status post 3 cycles of consolidation chemotherapy with docetaxel. Last dose was given July 12, 2007. 3. 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 4. Status post 39 cycles of maintenance Alimta at a dose of 500 mg/m2. Last dose was given February 25, 2010, discontinued secondary to persistent anemia, but the patient has stable disease.  CURRENT THERAPY: Tarceva 150 mg by mouth daily. Status post approximately 8 months of therapy.  INTERVAL HISTORY: Diane [name] 72 y.o. female returns to the clinic today for followup visit. The patient is feeling fine today with no specific complaints. She denied having any significant chest pain, shortness of breath, cough or hemoptysis. She has no weight loss or night sweats. She is tolerating her treatment was Tarceva fairly well with no significant adverse effects specifically no skin rash but few episodes of diarrhea every now and then.  MEDICAL HISTORY: Past Medical History  Diagnosis Date  . Hypertension   . Renal insufficiency     stage 3 renal disease/ pt  . lung ca dx'd 02/2010  . 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  MEDICATIONS:  Current Outpatient Prescriptions  Medication Sig Dispense Refill  . acetaminophen (TYLENOL) 325 MG tablet Take 650 mg by mouth every 6 (six) hours as needed.        Marland Kitchen aspirin 81 MG tablet Take 81 mg by mouth  daily.        . calcitRIOL (ROCALTROL) 0.25 MCG capsule Take 0.25 mcg by mouth every Monday, Wednesday, and Friday.      . clindamycin (CLEOCIN-T) 1 % external solution Apply topically 2 (two) times daily.  60 mL  1  . erlotinib (TARCEVA) 150 MG tablet Take 1 tablet (150 mg total) by mouth daily. Take on an empty stomach 1 hour before meals or 2 hours after.  30 tablet  2  . labetalol (NORMODYNE) 200 MG tablet Take 200 mg by mouth 2 (two) times daily.        . polyethylene glycol (MIRALAX / GLYCOLAX) packet Take 17 g by mouth daily.      Marland Kitchen torsemide (DEMADEX) 20 MG tablet Take 20 mg by mouth daily.         No current facility-administered medications for this visit.    SURGICAL HISTORY:  Past Surgical History  Procedure Laterality Date  . Portacath placement  11/03/08-[facility]    tip in mid SVC-E. Mansell    REVIEW OF SYSTEMS:  A comprehensive review of systems was negative.   PHYSICAL EXAMINATION: General appearance: alert, cooperative and no distress Head: Normocephalic, without obvious abnormality, atraumatic Neck: no adenopathy Lymph nodes: Cervical, supraclavicular, and axillary nodes normal. Resp: clear to auscultation bilaterally Cardio: regular rate and rhythm, S1, S2 normal, no murmur, click, rub or gallop GI: soft, non-tender; bowel sounds normal; no masses,  no organomegaly Extremities: extremities normal, atraumatic, no cyanosis or edema  ECOG PERFORMANCE STATUS: 1 - Symptomatic but completely ambulatory  Blood pressure 118/70, pulse 84, temperature 97.3 F (36.3 C), temperature source Oral, resp. rate 18, height 5\' 6"  (1.676 m), weight 240 lb 8 oz (109.09 kg).  LABORATORY DATA: Lab Results  Component Value Date   WBC 5.8 04/08/2013   HGB 11.7 04/08/2013   HCT 34.3* 04/08/2013   MCV 91.7 04/08/2013   PLT 191 04/08/2013      Chemistry      Component Value Date/Time   NA 140 04/08/2013 1032   NA 137 11/14/2012 0816   NA 143 07/11/2012 0927   K 3.8 04/08/2013 1032    K 3.9 11/14/2012 0816   K 4.5 07/11/2012 0927   CL 103 04/08/2013 1032   CL 101 11/14/2012 0816   CL 101 07/11/2012 0927   CO2 29 04/08/2013 1032   CO2 28 11/14/2012 0816   CO2 29 07/11/2012 0927   BUN 20.0 04/08/2013 1032   BUN 17 11/14/2012 0816   BUN 16 07/11/2012 0927   CREATININE 1.6* 04/08/2013 1032   CREATININE 1.52* 11/14/2012 0816   CREATININE 1.5* 07/11/2012 0927      Component Value Date/Time   CALCIUM 9.5 04/08/2013 1032   CALCIUM 10.2 11/14/2012 0816   CALCIUM 10.0 07/11/2012 0927   ALKPHOS 67 04/08/2013 1032   ALKPHOS 74 11/14/2012 0816   ALKPHOS 71 07/11/2012 0927   AST 15 04/08/2013 1032   AST 16 11/14/2012 0816   AST 19 07/11/2012 0927   ALT 11 04/08/2013 1032   ALT 8 11/14/2012 0816   BILITOT 1.10 04/08/2013 1032   BILITOT 1.2 11/14/2012 0816   BILITOT 0.80 07/11/2012 0927       RADIOGRAPHIC STUDIES: No results found.  ASSESSMENT: this is a very pleasant 72 years old white female with recurrent non-small cell lung cancer, adenocarcinoma. She is currently on treatment with Tarceva for the last 8 months and tolerating it fairly well.  PLAN: I recommended for the patient to continue treatment with Tarceva as scheduled. I would see her back for follow up visit in one month with repeat CT scan of the chest, abdomen and pelvis for restaging of her disease. She was advised to call immediately if she has any concerning symptoms in the interval.  All questions were answered. The patient knows to call the clinic with any problems, questions or concerns. We can certainly see the patient much sooner if necessary.

## 2013-04-08 NOTE — Telephone Encounter (Signed)
Gave pt appt for lab and MD on june 2014, pt will drink water based contrast  in radiology on June 12th

## 2013-04-22 ENCOUNTER — Ambulatory Visit (HOSPITAL_BASED_OUTPATIENT_CLINIC_OR_DEPARTMENT_OTHER): Payer: Medicare Other

## 2013-04-22 VITALS — BP 119/74 | HR 84 | Temp 97.9°F

## 2013-04-22 DIAGNOSIS — C3490 Malignant neoplasm of unspecified part of unspecified bronchus or lung: Secondary | ICD-10-CM

## 2013-04-22 MED ORDER — HEPARIN SOD (PORK) LOCK FLUSH 100 UNIT/ML IV SOLN
500.0000 [IU] | Freq: Once | INTRAVENOUS | Status: AC
  Administered 2013-04-22: 500 [IU] via INTRAVENOUS
  Filled 2013-04-22: qty 5

## 2013-04-22 MED ORDER — SODIUM CHLORIDE 0.9 % IJ SOLN
10.0000 mL | INTRAMUSCULAR | Status: DC | PRN
  Administered 2013-04-22: 10 mL via INTRAVENOUS
  Filled 2013-04-22: qty 10

## 2013-05-05 ENCOUNTER — Telehealth: Payer: Self-pay | Admitting: Medical Oncology

## 2013-05-05 NOTE — Telephone Encounter (Signed)
Pt has CT scan scheduled for Thursday 6/12 and said Dr Justin Mend does not want her to have CT with dye. I called CT and spoke to [facility] and she said she will change it to CT scan without dye. Forwarded to Dr [name].

## 2013-05-05 NOTE — Telephone Encounter (Signed)
Pt.notified

## 2013-05-06 ENCOUNTER — Encounter: Payer: Self-pay | Admitting: Internal Medicine

## 2013-05-06 NOTE — Progress Notes (Unsigned)
05/06/2013 I spoke with Shauna Hugh, RN, Dr. Worthy Flank nurse.  The request for abd and pelvis without contrast ct scan was denied by [facility] Medicare.  The reviewer offered me an abdomen ct scan without contrast.  I did accept this procedure pending Dr. Worthy Flank approval. If this procedure does not meet his approval, Dr. [name] will ned to call and do a PEER-TO-PEER REVIEW because [place] has changed their guidelines for CT SCANS for abdomen and pelvis.  The voiced understanding.  Vaughan Basta [phone]

## 2013-05-08 ENCOUNTER — Other Ambulatory Visit (HOSPITAL_COMMUNITY): Payer: Medicare Other

## 2013-05-08 ENCOUNTER — Other Ambulatory Visit (HOSPITAL_BASED_OUTPATIENT_CLINIC_OR_DEPARTMENT_OTHER): Payer: Medicare Other | Admitting: Lab

## 2013-05-08 ENCOUNTER — Ambulatory Visit (HOSPITAL_COMMUNITY)
Admission: RE | Admit: 2013-05-08 | Discharge: 2013-05-08 | Disposition: A | Discharge status: Home / Self Care | Payer: Medicare Other | Source: Ambulatory Visit | Attending: Internal Medicine | Admitting: Internal Medicine

## 2013-05-08 DIAGNOSIS — C3430 Malignant neoplasm of lower lobe, unspecified bronchus or lung: Secondary | ICD-10-CM

## 2013-05-08 DIAGNOSIS — I2510 Atherosclerotic heart disease of native coronary artery without angina pectoris: Secondary | ICD-10-CM | POA: Insufficient documentation

## 2013-05-08 DIAGNOSIS — I7090 Unspecified atherosclerosis: Secondary | ICD-10-CM | POA: Insufficient documentation

## 2013-05-08 DIAGNOSIS — J438 Other emphysema: Secondary | ICD-10-CM | POA: Insufficient documentation

## 2013-05-08 DIAGNOSIS — C3490 Malignant neoplasm of unspecified part of unspecified bronchus or lung: Secondary | ICD-10-CM | POA: Insufficient documentation

## 2013-05-08 DIAGNOSIS — J90 Pleural effusion, not elsewhere classified: Secondary | ICD-10-CM | POA: Insufficient documentation

## 2013-05-08 DIAGNOSIS — Z79899 Other long term (current) drug therapy: Secondary | ICD-10-CM | POA: Insufficient documentation

## 2013-05-08 DIAGNOSIS — I700 Atherosclerosis of aorta: Secondary | ICD-10-CM | POA: Insufficient documentation

## 2013-05-08 LAB — CBC WITH DIFFERENTIAL/PLATELET
BASO%: 1 % (ref 0.0–2.0)
Basophils Absolute: 0 10*3/uL (ref 0.0–0.1)
EOS%: 5.5 % (ref 0.0–7.0)
HCT: 35.1 % (ref 34.8–46.6)
HGB: 12.2 g/dL (ref 11.6–15.9)
LYMPH%: 12.6 % — ABNORMAL LOW (ref 14.0–49.7)
MCH: 31.9 pg (ref 25.1–34.0)
MCHC: 34.8 g/dL (ref 31.5–36.0)
MONO#: 0.5 10*3/uL (ref 0.1–0.9)
NEUT%: 69.8 % (ref 38.4–76.8)
Platelets: 182 10*3/uL (ref 145–400)
lymph#: 0.6 10*3/uL — ABNORMAL LOW (ref 0.9–3.3)

## 2013-05-08 LAB — COMPREHENSIVE METABOLIC PANEL (CC13)
ALT: 11 U/L (ref 0–55)
BUN: 20.4 mg/dL (ref 7.0–26.0)
CO2: 29 mEq/L (ref 22–29)
Calcium: 10.1 mg/dL (ref 8.4–10.4)
Chloride: 102 mEq/L (ref 98–107)
Creatinine: 1.6 mg/dL — ABNORMAL HIGH (ref 0.6–1.1)
Total Bilirubin: 1.48 mg/dL — ABNORMAL HIGH (ref 0.20–1.20)

## 2013-05-08 MED ORDER — IOHEXOL 300 MG/ML  SOLN
50.0000 mL | Freq: Once | INTRAMUSCULAR | Status: AC | PRN
  Administered 2013-05-08: 50 mL via ORAL

## 2013-05-13 ENCOUNTER — Ambulatory Visit (HOSPITAL_BASED_OUTPATIENT_CLINIC_OR_DEPARTMENT_OTHER): Payer: Medicare Other | Admitting: Internal Medicine

## 2013-05-13 ENCOUNTER — Encounter: Payer: Self-pay | Admitting: Internal Medicine

## 2013-05-13 ENCOUNTER — Telehealth: Payer: Self-pay | Admitting: Internal Medicine

## 2013-05-13 VITALS — BP 112/66 | HR 89 | Temp 96.8°F | Resp 20 | Ht 66.0 in | Wt 241.6 lb

## 2013-05-13 DIAGNOSIS — C3490 Malignant neoplasm of unspecified part of unspecified bronchus or lung: Secondary | ICD-10-CM

## 2013-05-13 NOTE — Progress Notes (Signed)
[place] [phone]   Fax:(336) [phone]  OFFICE PROGRESS NOTE  [name], MD [address] [place] [address]  DIAGNOSIS: Recurrent non-small cell lung cancer, adenocarcinoma initially diagnosed as stage IIA in March 2008. The tumor cells were negative for EGFR mutation as well as ALK gene translocation.   PRIOR THERAPY:  1. Status post concurrent chemoradiation with weekly carboplatin and paclitaxel. Last dose was given Apr 12, 2007. 2. Status post 3 cycles of consolidation chemotherapy with docetaxel. Last dose was given July 12, 2007. 3. 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 4. Status post 39 cycles of maintenance Alimta at a dose of 500 mg/m2. Last dose was given February 25, 2010, discontinued secondary to persistent anemia, but the patient has stable disease.  CURRENT THERAPY: Tarceva 150 mg by mouth daily. Status post approximately 9 months of therapy.   INTERVAL HISTORY: Diane [name] 72 y.o. female returns to the clinic today for followup visit. The patient is tolerating her treatment was Tarceva fairly well with no significant adverse effects except for occasional diarrhea. She denied having any significant skin rash but the patient denied having any nausea or vomiting. She has no chest pain, shortness breath, cough or hemoptysis. She had repeat CT scan of the chest and abdomen performed recently and she is here for evaluation and discussion of her scan results.  MEDICAL HISTORY: Past Medical History  Diagnosis Date  . Hypertension   . Renal insufficiency     stage 3 renal disease/ pt  . lung ca dx'd 02/2010  . 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  MEDICATIONS:  Current Outpatient Prescriptions  Medication Sig Dispense Refill  . acetaminophen (TYLENOL) 325 MG tablet Take 650 mg by mouth every 6  (six) hours as needed.        Marland Kitchen aspirin 81 MG tablet Take 81 mg by mouth daily.        . calcitRIOL (ROCALTROL) 0.25 MCG capsule Take 0.25 mcg by mouth every Monday, Wednesday, and Friday.      . clindamycin (CLEOCIN-T) 1 % external solution Apply topically 2 (two) times daily.  60 mL  1  . erlotinib (TARCEVA) 150 MG tablet Take 1 tablet (150 mg total) by mouth daily. Take on an empty stomach 1 hour before meals or 2 hours after.  30 tablet  2  . labetalol (NORMODYNE) 200 MG tablet Take 200 mg by mouth 2 (two) times daily.        . polyethylene glycol (MIRALAX / GLYCOLAX) packet Take 17 g by mouth daily.      Marland Kitchen torsemide (DEMADEX) 20 MG tablet Take 20 mg by mouth daily.         No current facility-administered medications for this visit.    SURGICAL HISTORY:  Past Surgical History  Procedure Laterality Date  . Portacath placement  11/03/08-[facility]    tip in mid SVC-E. Mansell    REVIEW OF SYSTEMS:  A comprehensive review of systems was negative except for: Gastrointestinal: positive for diarrhea   PHYSICAL EXAMINATION: General appearance: alert, cooperative and no distress Head: Normocephalic, without obvious abnormality, atraumatic Neck: no adenopathy Lymph nodes: Cervical, supraclavicular, and axillary nodes normal. Resp: clear to auscultation bilaterally Cardio: regular rate and rhythm, S1, S2 normal, no murmur, click, rub or gallop GI: soft, non-tender; bowel sounds normal; no masses,  no organomegaly  Extremities: extremities normal, atraumatic, no cyanosis or edema Neurologic: Alert and oriented X 3, normal strength and tone. Normal symmetric reflexes. Normal coordination and gait  ECOG PERFORMANCE STATUS: 1 - Symptomatic but completely ambulatory  Blood pressure 112/66, pulse 89, temperature 96.8 F (36 C), temperature source Oral, resp. rate 20, height 5\' 6"  (1.676 m), weight 241 lb 9.6 oz (109.589 kg).  LABORATORY DATA: Lab Results  Component Value Date   WBC 4.9  05/08/2013   HGB 12.2 05/08/2013   HCT 35.1 05/08/2013   MCV 91.7 05/08/2013   PLT 182 05/08/2013      Chemistry      Component Value Date/Time   NA 138 05/08/2013 0851   NA 137 11/14/2012 0816   NA 143 07/11/2012 0927   K 4.2 05/08/2013 0851   K 3.9 11/14/2012 0816   K 4.5 07/11/2012 0927   CL 102 05/08/2013 0851   CL 101 11/14/2012 0816   CL 101 07/11/2012 0927   CO2 29 05/08/2013 0851   CO2 28 11/14/2012 0816   CO2 29 07/11/2012 0927   BUN 20.4 05/08/2013 0851   BUN 17 11/14/2012 0816   BUN 16 07/11/2012 0927   CREATININE 1.6* 05/08/2013 0851   CREATININE 1.52* 11/14/2012 0816   CREATININE 1.5* 07/11/2012 0927      Component Value Date/Time   CALCIUM 10.1 05/08/2013 0851   CALCIUM 10.2 11/14/2012 0816   CALCIUM 10.0 07/11/2012 0927   ALKPHOS 69 05/08/2013 0851   ALKPHOS 74 11/14/2012 0816   ALKPHOS 71 07/11/2012 0927   AST 15 05/08/2013 0851   AST 16 11/14/2012 0816   AST 19 07/11/2012 0927   ALT 11 05/08/2013 0851   ALT 8 11/14/2012 0816   BILITOT 1.48* 05/08/2013 0851   BILITOT 1.2 11/14/2012 0816   BILITOT 0.80 07/11/2012 0927       RADIOGRAPHIC STUDIES: Ct Chest Wo Contrast  05/08/2013   *RADIOLOGY REPORT*  Clinical Data:  History of lung cancer.  Oral chemotherapy ongoing.  CT CHEST, ABDOMEN AND PELVIS WITHOUT CONTRAST  Technique:  Multidetector CT imaging of the chest, abdomen and pelvis was performed following the standard protocol without IV contrast.  Comparison:  CT of the chest abdomen and pelvis 02/10/2013.   CT CHEST  Findings:  Mediastinum: Heart size is normal. There is no significant pericardial fluid, thickening or pericardial calcification. There is atherosclerosis of the thoracic aorta, the great vessels of the mediastinum and the coronary arteries, including calcified atherosclerotic plaque in the left main, left anterior descending, left circumflex and right coronary arteries. Left-sided internal jugular single lumen Port-A-Cath with tip terminating at the superior  cavoatrial junction.  Amorphous soft tissue in the right hilar region, similar to the prior examination on favored to reflect predominantly chronic postradiation changes.  No definite right hilar lymphadenopathy is identified on today's noncontrast CT examination. No pathologically enlarged mediastinal or left hilar lymph nodes. Please note that accurate exclusion of hilar adenopathy is limited on noncontrast CT scans.  Small hiatal hernia.  Lungs/Pleura: As with numerous prior examinations there is extensive chronic volume loss and architectural distortion throughout the perihilar aspect of the right lung, most pronounced in the right lower lobe, compatible with chronic postradiation changes.  Moderate right-sided chronic mildly complex subpulmonic pleural effusion is similar in appearance with heterogeneous internal attenuation, including some internal calcifications.  As with prior examinations, there are multifocal patchy nodular appearing areas of ground-glass attenuation throughout the right lung which appear very similar to prior studies.  These are nonspecific, and favored to be related to prior radiation therapy or prior episodes of infection or inflammation. Similar findings are present to a lesser extent within the left lung as well.  A small area of peripheral bronchiolectasis with peribronchovascular micronodularity in the lower aspect of the lingula likely reflecting areas of mucoid impaction within terminal bronchioles. Background of mild centrilobular emphysema.  No acute consolidative airspace disease.  Musculoskeletal: Multiple Schmorl's nodes in the spine incidentally noted. There are no aggressive appearing lytic or blastic lesions noted in the visualized portions of the skeleton.  IMPRESSION:  1.  Similar chronic postradiation treatment related changes in the right lung, as above, without evidence to suggest local recurrence of disease or new metastatic disease in the thorax. 2.  Moderate sized  subpulmonic chronic complex right-sided pleural effusion is very similar to the recent prior studies.  Given the stability over the prior examinations, this is unlikely to be a malignant pleural effusion; however, sampling of the pleural fluid may be warranted if there is clinical suspicion for recurrence of disease. 3. Atherosclerosis, including left main and three-vessel coronary artery disease. 4.  Additional incidental findings, similar to prior studies, as detailed above.   CT ABDOMEN AND PELVIS  Findings:  Abdomen/Pelvis: The unenhanced appearance of the pancreas, spleen, bilateral adrenal glands kidneys is unremarkable.  No significant volume of ascites.  The distension of small bowel.  No definite pathologic lymphadenopathy and pelvis.  Extensive atherosclerosis throughout the abdominal and pelvic vasculature, without definite aneurysm.  Uterus and ovaries are unremarkable in appearance.  Musculoskeletal: There are no aggressive appearing lytic or blastic lesions noted in the visualized portions of the skeleton.  IMPRESSION:  1.  No findings to suggest metastatic disease to the abdomen or pelvis on today's noncontrast CT examination. 2.  Atherosclerosis.   Original Report Authenticated By: Vinnie Langton, M.D.    ASSESSMENT AND PLAN: This is a very pleasant 72 years old white female with recurrent non-small cell lung cancer, adenocarcinoma. The patient is currently on treatment with Tarceva 150 mg by mouth daily for the last 9 months and tolerating it fairly well. She has no evidence for disease progression on his recent scan. I recommended for the patient to continue her current treatment with Tarceva with the same dose. I would see her back for followup visit in one month's for reevaluation. She was advised to call immediately if she has any concerning symptoms in the interval.  All questions were answered. The patient knows to call the clinic with any problems, questions or concerns. We can  certainly see the patient much sooner if necessary.  I spent 15 minutes counseling the patient face to face. The total time spent in the appointment was 25 minutes.

## 2013-05-13 NOTE — Patient Instructions (Addendum)
No evidence for disease progression on his recent scan.  Followup visit in one month.

## 2013-05-13 NOTE — Telephone Encounter (Signed)
s.w. pt and advised on july appts....pt ok and aware

## 2013-05-16 ENCOUNTER — Other Ambulatory Visit: Payer: Self-pay | Admitting: *Deleted

## 2013-05-16 MED ORDER — ERLOTINIB HCL 150 MG PO TABS: 150.0000 mg | ORAL_TABLET | Freq: Every day | ORAL | Status: DC

## 2013-05-16 NOTE — Telephone Encounter (Signed)
Received fax from [facility] that rx has been shipped 05/15/13.  SLJ

## 2013-05-21 ENCOUNTER — Encounter: Payer: Self-pay | Admitting: Internal Medicine

## 2013-05-21 NOTE — Progress Notes (Signed)
Per PAN- received letter that patient was given additional funds 05/23/12-08/20/13  7696.80 is new balance. I advised billing and will forward to medical records.

## 2013-06-03 ENCOUNTER — Ambulatory Visit (HOSPITAL_BASED_OUTPATIENT_CLINIC_OR_DEPARTMENT_OTHER): Payer: Medicare Other

## 2013-06-03 VITALS — BP 132/75 | HR 85 | Temp 97.2°F | Resp 20

## 2013-06-03 DIAGNOSIS — Z452 Encounter for adjustment and management of vascular access device: Secondary | ICD-10-CM

## 2013-06-03 DIAGNOSIS — C3490 Malignant neoplasm of unspecified part of unspecified bronchus or lung: Secondary | ICD-10-CM

## 2013-06-03 MED ORDER — HEPARIN SOD (PORK) LOCK FLUSH 100 UNIT/ML IV SOLN
500.0000 [IU] | Freq: Once | INTRAVENOUS | Status: AC
  Administered 2013-06-03: 500 [IU] via INTRAVENOUS
  Filled 2013-06-03: qty 5

## 2013-06-03 MED ORDER — SODIUM CHLORIDE 0.9 % IJ SOLN
10.0000 mL | INTRAMUSCULAR | Status: DC | PRN
  Administered 2013-06-03: 10 mL via INTRAVENOUS
  Filled 2013-06-03: qty 10

## 2013-06-03 NOTE — Patient Instructions (Signed)
Call MD for problems or concerns 

## 2013-06-10 ENCOUNTER — Ambulatory Visit (HOSPITAL_BASED_OUTPATIENT_CLINIC_OR_DEPARTMENT_OTHER): Payer: Medicare Other | Admitting: Internal Medicine

## 2013-06-10 ENCOUNTER — Other Ambulatory Visit (HOSPITAL_BASED_OUTPATIENT_CLINIC_OR_DEPARTMENT_OTHER): Payer: Medicare Other | Admitting: Lab

## 2013-06-10 ENCOUNTER — Encounter: Payer: Self-pay | Admitting: Internal Medicine

## 2013-06-10 ENCOUNTER — Telehealth: Payer: Self-pay | Admitting: Internal Medicine

## 2013-06-10 VITALS — BP 107/72 | HR 78 | Temp 96.6°F | Resp 20 | Ht 66.0 in | Wt 240.3 lb

## 2013-06-10 DIAGNOSIS — C3490 Malignant neoplasm of unspecified part of unspecified bronchus or lung: Secondary | ICD-10-CM

## 2013-06-10 LAB — COMPREHENSIVE METABOLIC PANEL (CC13)
Albumin: 2.8 g/dL — ABNORMAL LOW (ref 3.5–5.0)
CO2: 28 mEq/L (ref 22–29)
Calcium: 10 mg/dL (ref 8.4–10.4)
Chloride: 104 mEq/L (ref 98–109)
Glucose: 81 mg/dl (ref 70–140)
Potassium: 3.9 mEq/L (ref 3.5–5.1)
Sodium: 140 mEq/L (ref 136–145)
Total Protein: 6 g/dL — ABNORMAL LOW (ref 6.4–8.3)

## 2013-06-10 LAB — CBC WITH DIFFERENTIAL/PLATELET
Eosinophils Absolute: 0.3 10*3/uL (ref 0.0–0.5)
LYMPH%: 11.5 % — ABNORMAL LOW (ref 14.0–49.7)
MONO#: 0.5 10*3/uL (ref 0.1–0.9)
NEUT#: 3.9 10*3/uL (ref 1.5–6.5)
Platelets: 179 10*3/uL (ref 145–400)
RBC: 3.71 10*6/uL (ref 3.70–5.45)
RDW: 14.3 % (ref 11.2–14.5)
WBC: 5.4 10*3/uL (ref 3.9–10.3)

## 2013-06-10 NOTE — Telephone Encounter (Signed)
gv and printed appt sched and avs for pt  °

## 2013-06-10 NOTE — Patient Instructions (Signed)
Continue treatment with Tarceva Followup visit in one month. 

## 2013-06-10 NOTE — Progress Notes (Signed)
[place] Telephone:([phone]   Fax:(336) [phone]  OFFICE PROGRESS NOTE  [name], MD [address] [place] [address]  DIAGNOSIS: Recurrent non-small cell lung cancer, adenocarcinoma initially diagnosed as stage IIA in March 2008. The tumor cells were negative for EGFR mutation as well as ALK gene translocation.   PRIOR THERAPY:  1. Status post concurrent chemoradiation with weekly carboplatin and paclitaxel. Last dose was given Apr 12, 2007. 2. Status post 3 cycles of consolidation chemotherapy with docetaxel. Last dose was given July 12, 2007. 3. 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 4. Status post 39 cycles of maintenance Alimta at a dose of 500 mg/m2. Last dose was given February 25, 2010, discontinued secondary to persistent anemia, but the patient has stable disease.  CURRENT THERAPY: Tarceva 150 mg by mouth daily. Status post approximately10  months of therapy.  INTERVAL HISTORY: Diane [name] 72 y.o. female returns to the clinic today for followup visit. The patient is feeling fine today with no specific complaints. She denied having any significant skin rash or diarrhea. The patient denied having any chest pain, shortness of breath, cough or hemoptysis. She has no weight loss or night sweats. She denied having any significant nausea or vomiting. She is tolerating her treatment was Tarceva fairly well with no significant adverse effects.  MEDICAL HISTORY: Past Medical History  Diagnosis Date  . Hypertension   . Renal insufficiency     stage 3 renal disease/ pt  . lung ca dx'd 02/2010  . 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  MEDICATIONS:  Current Outpatient Prescriptions  Medication Sig Dispense Refill  . acetaminophen (TYLENOL) 325 MG tablet Take 650 mg by mouth every 6 (six) hours as needed.        Marland Kitchen  aspirin 81 MG tablet Take 81 mg by mouth daily.        . calcitRIOL (ROCALTROL) 0.25 MCG capsule Take 0.25 mcg by mouth every Monday, Wednesday, and Friday.      . clindamycin (CLEOCIN-T) 1 % external solution Apply topically 2 (two) times daily.  60 mL  1  . erlotinib (TARCEVA) 150 MG tablet Take 1 tablet (150 mg total) by mouth daily. Take on an empty stomach 1 hour before meals or 2 hours after.  30 tablet  2  . labetalol (NORMODYNE) 200 MG tablet Take 200 mg by mouth 2 (two) times daily.        . polyethylene glycol (MIRALAX / GLYCOLAX) packet Take 17 g by mouth daily.      Marland Kitchen torsemide (DEMADEX) 20 MG tablet Take 20 mg by mouth daily.         No current facility-administered medications for this visit.    SURGICAL HISTORY:  Past Surgical History  Procedure Laterality Date  . Portacath placement  11/03/08-[facility]    tip in mid SVC-E. Mansell    REVIEW OF SYSTEMS:  A comprehensive review of systems was negative.   PHYSICAL EXAMINATION: General appearance: alert, cooperative and no distress Head: Normocephalic, without obvious abnormality, atraumatic Neck: no adenopathy Lymph nodes: Cervical, supraclavicular, and axillary nodes normal. Resp: clear to auscultation bilaterally Cardio: regular rate and rhythm, S1, S2 normal, no murmur, click, rub or gallop GI: soft, non-tender; bowel sounds normal; no masses,  no organomegaly Extremities: extremities normal, atraumatic, no cyanosis or edema  ECOG PERFORMANCE STATUS: 1 -  Symptomatic but completely ambulatory  Blood pressure 107/72, pulse 78, temperature 96.6 F (35.9 C), temperature source Oral, resp. rate 20, height 5\' 6"  ([id number] m), weight 240 lb 4.8 oz (108.999 kg).  LABORATORY DATA: Lab Results  Component Value Date   WBC 5.4 06/10/2013   HGB 11.8 06/10/2013   HCT 34.5* 06/10/2013   MCV 93.1 06/10/2013   PLT 179 06/10/2013      Chemistry      Component Value Date/Time   NA 140 06/10/2013 0948   NA 137 11/14/2012 0816   NA  143 07/11/2012 0927   K 3.9 06/10/2013 0948   K 3.9 11/14/2012 0816   K 4.5 07/11/2012 0927   CL 102 05/08/2013 0851   CL 101 11/14/2012 0816   CL 101 07/11/2012 0927   CO2 28 06/10/2013 0948   CO2 28 11/14/2012 0816   CO2 29 07/11/2012 0927   BUN 19.6 06/10/2013 0948   BUN 17 11/14/2012 0816   BUN 16 07/11/2012 0927   CREATININE 1.6* 06/10/2013 0948   CREATININE 1.52* 11/14/2012 0816   CREATININE 1.5* 07/11/2012 0927      Component Value Date/Time   CALCIUM 10.0 06/10/2013 0948   CALCIUM 10.2 11/14/2012 0816   CALCIUM 10.0 07/11/2012 0927   ALKPHOS 64 06/10/2013 0948   ALKPHOS 74 11/14/2012 0816   ALKPHOS 71 07/11/2012 0927   AST 16 06/10/2013 0948   AST 16 11/14/2012 0816   AST 19 07/11/2012 0927   ALT 12 06/10/2013 0948   ALT 8 11/14/2012 0816   BILITOT 1.22* 06/10/2013 0948   BILITOT 1.2 11/14/2012 0816   BILITOT 0.80 07/11/2012 0927       RADIOGRAPHIC STUDIES: No results found.  ASSESSMENT AND PLAN: This is a very pleasant 72 years old white female with recurrent non-small cell lung cancer currently on treatment with Tarceva 150 mg by mouth daily status post 10 months of treatment. The patient is tolerating her treatment fairly well with no significant adverse effects. I recommended for her to continue her current treatment. I would see her back for followup visit in one month with repeat CBC and comprehensive metabolic panel. She was advised to call immediately if she has any concerning symptoms in the interval.  All questions were answered. The patient knows to call the clinic with any problems, questions or concerns. We can certainly see the patient much sooner if necessary.

## 2013-06-12 ENCOUNTER — Other Ambulatory Visit: Payer: Medicare Other | Admitting: Lab

## 2013-06-17 ENCOUNTER — Encounter: Payer: Self-pay | Admitting: *Deleted

## 2013-06-17 NOTE — Progress Notes (Signed)
RECEIVED A FAX FROM [facility] CONCERNING A CONFIRMATION OF PRESCRIPTION SHIPMENT FOR Diane [name] ON 06/16/13.

## 2013-07-15 ENCOUNTER — Ambulatory Visit (HOSPITAL_BASED_OUTPATIENT_CLINIC_OR_DEPARTMENT_OTHER): Payer: Medicare Other

## 2013-07-15 ENCOUNTER — Other Ambulatory Visit (HOSPITAL_BASED_OUTPATIENT_CLINIC_OR_DEPARTMENT_OTHER): Payer: Medicare Other | Admitting: Lab

## 2013-07-15 ENCOUNTER — Ambulatory Visit (HOSPITAL_BASED_OUTPATIENT_CLINIC_OR_DEPARTMENT_OTHER): Payer: Medicare Other | Admitting: Internal Medicine

## 2013-07-15 ENCOUNTER — Telehealth: Payer: Self-pay | Admitting: Internal Medicine

## 2013-07-15 ENCOUNTER — Encounter: Payer: Self-pay | Admitting: Internal Medicine

## 2013-07-15 VITALS — BP 125/56 | HR 88 | Temp 98.1°F | Resp 20 | Ht 66.0 in | Wt 239.4 lb

## 2013-07-15 DIAGNOSIS — I129 Hypertensive chronic kidney disease with stage 1 through stage 4 chronic kidney disease, or unspecified chronic kidney disease: Secondary | ICD-10-CM

## 2013-07-15 DIAGNOSIS — C3490 Malignant neoplasm of unspecified part of unspecified bronchus or lung: Secondary | ICD-10-CM

## 2013-07-15 DIAGNOSIS — C3430 Malignant neoplasm of lower lobe, unspecified bronchus or lung: Secondary | ICD-10-CM

## 2013-07-15 DIAGNOSIS — Z452 Encounter for adjustment and management of vascular access device: Secondary | ICD-10-CM

## 2013-07-15 LAB — CBC WITH DIFFERENTIAL/PLATELET
Basophils Absolute: 0 10*3/uL (ref 0.0–0.1)
Eosinophils Absolute: 0.3 10*3/uL (ref 0.0–0.5)
HCT: 33.6 % — ABNORMAL LOW (ref 34.8–46.6)
HGB: 11.5 g/dL — ABNORMAL LOW (ref 11.6–15.9)
MONO#: 0.5 10*3/uL (ref 0.1–0.9)
NEUT%: 74.8 % (ref 38.4–76.8)
Platelets: 198 10*3/uL (ref 145–400)
WBC: 5.7 10*3/uL (ref 3.9–10.3)
lymph#: 0.6 10*3/uL — ABNORMAL LOW (ref 0.9–3.3)

## 2013-07-15 LAB — COMPREHENSIVE METABOLIC PANEL (CC13)
ALT: 8 U/L (ref 0–55)
BUN: 21.8 mg/dL (ref 7.0–26.0)
CO2: 27 mEq/L (ref 22–29)
Calcium: 9.8 mg/dL (ref 8.4–10.4)
Chloride: 105 mEq/L (ref 98–109)
Creatinine: 1.6 mg/dL — ABNORMAL HIGH (ref 0.6–1.1)
Glucose: 84 mg/dl (ref 70–140)

## 2013-07-15 MED ORDER — SODIUM CHLORIDE 0.9 % IJ SOLN
10.0000 mL | INTRAMUSCULAR | Status: DC | PRN
  Administered 2013-07-15: 10 mL via INTRAVENOUS
  Filled 2013-07-15: qty 10

## 2013-07-15 MED ORDER — HEPARIN SOD (PORK) LOCK FLUSH 100 UNIT/ML IV SOLN
500.0000 [IU] | Freq: Once | INTRAVENOUS | Status: AC
  Administered 2013-07-15: 500 [IU] via INTRAVENOUS
  Filled 2013-07-15: qty 5

## 2013-07-15 NOTE — Patient Instructions (Signed)
Implanted Port Instructions  An implanted port is a central line that has a round shape and is placed under the skin. It is used for long-term IV (intravenous) access for:  · Medicine.  · Fluids.  · Liquid nutrition, such as TPN (total parenteral nutrition).  · Blood samples.  Ports can be placed:  · In the chest area just below the collarbone (this is the most common place.)  · In the arms.  · In the belly (abdomen) area.  · In the legs.  PARTS OF THE PORT  A port has 2 main parts:  · The reservoir. The reservoir is round, disc-shaped, and will be a small, raised area under your skin.  · The reservoir is the part where a needle is inserted (accessed) to either give medicines or to draw blood.  · The catheter. The catheter is a long, slender tube that extends from the reservoir. The catheter is placed into a large vein.  · Medicine that is inserted into the reservoir goes into the catheter and then into the vein.  INSERTION OF THE PORT  · The port is surgically placed in either an operating room or in a procedural area (interventional radiology).  · Medicine may be given to help you relax during the procedure.  · The skin where the port will be inserted is numbed (local anesthetic).  · 1 or 2 small cuts (incisions) will be made in the skin to insert the port.  · The port can be used after it has been inserted.  INCISION SITE CARE  · The incision site may have small adhesive strips on it. This helps keep the incision site closed. Sometimes, no adhesive strips are placed. Instead of adhesive strips, a special kind of surgical glue is used to keep the incision closed.  · If adhesive strips were placed on the incision sites, do not take them off. They will fall off on their own.  · The incision site may be sore for 1 to 2 days. Pain medicine can help.  · Do not get the incision site wet. Bathe or shower as directed by your caregiver.  · The incision site should heal in 5 to 7 days. A small scar may form after the  incision has healed.  ACCESSING THE PORT  Special steps must be taken to access the port:  · Before the port is accessed, a numbing cream can be placed on the skin. This helps numb the skin over the port site.  · A sterile technique is used to access the port.  · The port is accessed with a needle. Only "non-coring" port needles should be used to access the port. Once the port is accessed, a blood return should be checked. This helps ensure the port is in the vein and is not clogged (clotted).  · If your caregiver believes your port should remain accessed, a clear (transparent) bandage will be placed over the needle site. The bandage and needle will need to be changed every week or as directed by your caregiver.  · Keep the bandage covering the needle clean and dry. Do not get it wet. Follow your caregiver's instructions on how to take a shower or bath when the port is accessed.  · If your port does not need to stay accessed, no bandage is needed over the port.  FLUSHING THE PORT  Flushing the port keeps it from getting clogged. How often the port is flushed depends on:  · If a   constant infusion is running. If a constant infusion is running, the port may not need to be flushed.  · If intermittent medicines are given.  · If the port is not being used.  For intermittent medicines:  · The port will need to be flushed:  · After medicines have been given.  · After blood has been drawn.  · As part of routine maintenance.  · A port is normally flushed with:  · Normal saline.  · Heparin.  · Follow your caregiver's advice on how often, how much, and the type of flush to use on your port.  IMPORTANT PORT INFORMATION  · Tell your caregiver if you are allergic to heparin.  · After your port is placed, you will get a manufacturer's information card. The card has information about your port. Keep this card with you at all times.  · There are many types of ports available. Know what kind of port you have.  · In case of an  emergency, it may be helpful to wear a medical alert bracelet. This can help alert health care workers that you have a port.  · The port can stay in for as long as your caregiver believes it is necessary.  · When it is time for the port to come out, surgery will be done to remove it. The surgery will be similar to how the port was put in.  · If you are in the hospital or clinic:  · Your port will be taken care of and flushed by a nurse.  · If you are at home:  · A home health care nurse may give medicines and take care of the port.  · You or a family member can get special training and directions for giving medicine and taking care of the port at home.  SEEK IMMEDIATE MEDICAL CARE IF:   · Your port does not flush or you are unable to get a blood return.  · New drainage or pus is coming from the incision.  · A bad smell is coming from the incision site.  · You develop swelling or increased redness at the incision site.  · You develop increased swelling or pain at the port site.  · You develop swelling or pain in the surrounding skin near the port.  · You have an oral temperature above 102° F (38.9° C), not controlled by medicine.  MAKE SURE YOU:   · Understand these instructions.  · Will watch your condition.  · Will get help right away if you are not doing well or get worse.  Document Released: 11/13/2005 Document Revised: 02/05/2012 Document Reviewed: 02/04/2009  [employer]® Patient Information ©2014 [employer].

## 2013-07-15 NOTE — Patient Instructions (Signed)
CURRENT THERAPY: Tarceva 150 mg by mouth daily. Status post approximately11 months of therapy.  CHEMOTHERAPY INTENT: Palliative  CURRENT # OF CHEMOTHERAPY CYCLES: 11  CURRENT ANTIEMETICS: Compazine when necessary  CURRENT SMOKING STATUS: Former smoker quit 11/27/2001  ORAL CHEMOTHERAPY AND CONSENT: Tarceva  CURRENT BISPHOSPHONATES USE: None  PAIN MANAGEMENT: 0/10  NARCOTICS INDUCED CONSTIPATION: None  LIVING WILL AND CODE STATUS: Full code initially but no prolonged resuscitation.

## 2013-07-15 NOTE — Progress Notes (Signed)
[place] [phone]   Fax:(336) [phone]  OFFICE PROGRESS NOTE  [name], MD [address] [place] [address]  DIAGNOSIS: Recurrent non-small cell lung cancer, adenocarcinoma initially diagnosed as stage IIA in March 2008. The tumor cells were negative for EGFR mutation as well as ALK gene translocation.   PRIOR THERAPY:  1. Status post concurrent chemoradiation with weekly carboplatin and paclitaxel. Last dose was given Apr 12, 2007. 2. Status post 3 cycles of consolidation chemotherapy with docetaxel. Last dose was given July 12, 2007. 3. 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 4. Status post 39 cycles of maintenance Alimta at a dose of 500 mg/m2. Last dose was given February 25, 2010, discontinued secondary to persistent anemia, but the patient has stable disease.  CURRENT THERAPY: Tarceva 150 mg by mouth daily. Status post approximately11 months of therapy.  CHEMOTHERAPY INTENT: Palliative  CURRENT # OF CHEMOTHERAPY CYCLES: 11  CURRENT ANTIEMETICS: Compazine when necessary  CURRENT SMOKING STATUS: Former smoker quit 11/27/2001  ORAL CHEMOTHERAPY AND CONSENT: Tarceva  CURRENT BISPHOSPHONATES USE: None  PAIN MANAGEMENT: 0/10  NARCOTICS INDUCED CONSTIPATION: None  LIVING WILL AND CODE STATUS: Full code initially but no prolonged resuscitation.    INTERVAL HISTORY: Diane [name] 72 y.o. female returns to the clinic today for monthly followup visit. The patient is feeling fine today with no specific complaints except for some soreness of the scalp. She had 2 episodes of diarrhea but no significant skin rash. The patient denied having any significant weight loss or night sweats. She continues to have shortness breath with exertion but no significant chest pain, cough or hemoptysis. She is tolerating her treatment with Tarceva fairly well.  MEDICAL  HISTORY: Past Medical History  Diagnosis Date  . Hypertension   . Renal insufficiency     stage 3 renal disease/ pt  . lung ca dx'd 02/2010  . 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  MEDICATIONS:  Current Outpatient Prescriptions  Medication Sig Dispense Refill  . acetaminophen (TYLENOL) 325 MG tablet Take 650 mg by mouth every 6 (six) hours as needed.        Marland Kitchen aspirin 81 MG tablet Take 81 mg by mouth daily.        . calcitRIOL (ROCALTROL) 0.25 MCG capsule Take 0.25 mcg by mouth every Monday, Wednesday, and Friday.      . clindamycin (CLEOCIN-T) 1 % external solution Apply topically 2 (two) times daily.  60 mL  1  . erlotinib (TARCEVA) 150 MG tablet Take 1 tablet (150 mg total) by mouth daily. Take on an empty stomach 1 hour before meals or 2 hours after.  30 tablet  2  . labetalol (NORMODYNE) 200 MG tablet Take 200 mg by mouth 2 (two) times daily.        . minocycline (MINOCIN,DYNACIN) 50 MG capsule Take 50 mg by mouth daily.       Marland Kitchen neomycin-polymyxin b-dexamethasone (MAXITROL) 3.5-10000-0.1 OINT       . polyethylene glycol (MIRALAX / GLYCOLAX) packet Take 17 g by mouth daily.      Marland Kitchen torsemide (DEMADEX) 20 MG tablet Take 20 mg by mouth daily.         No current facility-administered medications for this visit.    SURGICAL HISTORY:  Past Surgical History  Procedure Laterality Date  . Portacath placement  11/03/08-[facility]  tip in mid SVC-E. Mansell    REVIEW OF SYSTEMS:  A comprehensive review of systems was negative except for: Gastrointestinal: positive for diarrhea   PHYSICAL EXAMINATION: General appearance: alert, cooperative and no distress Head: Normocephalic, without obvious abnormality, atraumatic Neck: no adenopathy Lymph nodes: Cervical, supraclavicular, and axillary nodes normal. Resp: clear to auscultation bilaterally Cardio: regular rate and rhythm, S1, S2 normal, no murmur, click, rub or gallop GI: soft,  non-tender; bowel sounds normal; no masses,  no organomegaly Extremities: extremities normal, atraumatic, no cyanosis or edema  ECOG PERFORMANCE STATUS: 1 - Symptomatic but completely ambulatory  Blood pressure 125/56, pulse 88, temperature 98.1 F (36.7 C), temperature source Oral, resp. rate 20, height 5\' 6"  (1.676 m), weight 239 lb 6.4 oz (108.591 kg).  LABORATORY DATA: Lab Results  Component Value Date   WBC 5.7 07/15/2013   HGB 11.5* 07/15/2013   HCT 33.6* 07/15/2013   MCV 92.5 07/15/2013   PLT 198 07/15/2013      Chemistry      Component Value Date/Time   NA 141 07/15/2013 0959   NA 137 11/14/2012 0816   NA 143 07/11/2012 0927   K 3.8 07/15/2013 0959   K 3.9 11/14/2012 0816   K 4.5 07/11/2012 0927   CL 102 05/08/2013 0851   CL 101 11/14/2012 0816   CL 101 07/11/2012 0927   CO2 27 07/15/2013 0959   CO2 28 11/14/2012 0816   CO2 29 07/11/2012 0927   BUN 21.8 07/15/2013 0959   BUN 17 11/14/2012 0816   BUN 16 07/11/2012 0927   CREATININE 1.6* 07/15/2013 0959   CREATININE 1.52* 11/14/2012 0816   CREATININE 1.5* 07/11/2012 0927      Component Value Date/Time   CALCIUM 9.8 07/15/2013 0959   CALCIUM 10.2 11/14/2012 0816   CALCIUM 10.0 07/11/2012 0927   ALKPHOS 70 07/15/2013 0959   ALKPHOS 74 11/14/2012 0816   ALKPHOS 71 07/11/2012 0927   AST 14 07/15/2013 0959   AST 16 11/14/2012 0816   AST 19 07/11/2012 0927   ALT 8 07/15/2013 0959   ALT 8 11/14/2012 0816   ALT 18 07/11/2012 0927   BILITOT 1.21* 07/15/2013 0959   BILITOT 1.2 11/14/2012 0816   BILITOT 0.80 07/11/2012 0927       RADIOGRAPHIC STUDIES: No results found.  ASSESSMENT AND PLAN: This is a very pleasant 72 years old white female with recurrent non-small cell lung cancer, adenocarcinoma currently on treatment with Tarceva 150 mg by mouth daily for the last 11 months and tolerating her treatment fairly well except for scalp sores. I recommended for the patient to continue treatment was Tarceva with the same dose. For the  scalp sores I recommend for the patient to use Dandruff shampoo as well as clindamycin lotion. She will come back for followup visit in one month with repeat CT scan of the chest, abdomen and pelvis without contrast.  The patient voices understanding of current disease status and treatment options and is in agreement with the current care plan.  All questions were answered. The patient knows to call the clinic with any problems, questions or concerns. We can certainly see the patient much sooner if necessary.

## 2013-07-15 NOTE — Telephone Encounter (Signed)
Gave pt appt for lab and MD on September , pt will drink contrast @ radiology

## 2013-07-17 ENCOUNTER — Encounter: Payer: Self-pay | Admitting: *Deleted

## 2013-07-17 NOTE — Progress Notes (Signed)
RECEIVED A FAX FROM [facility] CONCERNING A CONFIRMATION OF PRESCRIPTION SHIPMENT FOR Diane [name] ON 07/16/13.

## 2013-07-21 ENCOUNTER — Other Ambulatory Visit (HOSPITAL_COMMUNITY): Payer: Self-pay | Admitting: Family Medicine

## 2013-07-21 DIAGNOSIS — Z1231 Encounter for screening mammogram for malignant neoplasm of breast: Secondary | ICD-10-CM

## 2013-07-24 ENCOUNTER — Telehealth: Payer: Self-pay | Admitting: Medical Oncology

## 2013-07-24 MED ORDER — DOXYCYCLINE HYCLATE 50 MG PO CAPS: 100.0000 mg | ORAL_CAPSULE | Freq: Two times a day (BID) | ORAL | Status: DC

## 2013-07-24 NOTE — Telephone Encounter (Signed)
Per Adrena I called in antibiotic . Escribed in error to [facility] so i called it in to [facility]. Called [facility] to cancel rx.

## 2013-07-24 NOTE — Telephone Encounter (Signed)
Reports increase in  broken skin with 11 sores on her scalp that are scabbed over. Per Adrena I called in antibitoic.

## 2013-08-01 ENCOUNTER — Ambulatory Visit (HOSPITAL_COMMUNITY)
Admission: RE | Admit: 2013-08-01 | Discharge: 2013-08-01 | Disposition: A | Discharge status: Home / Self Care | Payer: Medicare Other | Source: Ambulatory Visit | Attending: Family Medicine | Admitting: Family Medicine

## 2013-08-01 DIAGNOSIS — Z1231 Encounter for screening mammogram for malignant neoplasm of breast: Secondary | ICD-10-CM

## 2013-08-04 ENCOUNTER — Other Ambulatory Visit: Payer: Self-pay | Admitting: Family Medicine

## 2013-08-04 DIAGNOSIS — R928 Other abnormal and inconclusive findings on diagnostic imaging of breast: Secondary | ICD-10-CM

## 2013-08-06 ENCOUNTER — Ambulatory Visit
Admission: RE | Admit: 2013-08-06 | Discharge: 2013-08-06 | Disposition: A | Discharge status: Home / Self Care | Payer: Medicare Other | Source: Ambulatory Visit | Attending: Family Medicine | Admitting: Family Medicine

## 2013-08-06 DIAGNOSIS — R928 Other abnormal and inconclusive findings on diagnostic imaging of breast: Secondary | ICD-10-CM

## 2013-08-12 ENCOUNTER — Other Ambulatory Visit: Payer: Self-pay | Admitting: *Deleted

## 2013-08-12 NOTE — Telephone Encounter (Signed)
THIS REFILL REQUEST FOR TARCEVA WAS PLACED ON DR.MOHAMED'S DESK. 

## 2013-08-13 ENCOUNTER — Telehealth: Payer: Self-pay | Admitting: *Deleted

## 2013-08-13 ENCOUNTER — Other Ambulatory Visit: Payer: Self-pay | Admitting: Medical Oncology

## 2013-08-13 MED ORDER — ERLOTINIB HCL 150 MG PO TABS: 150.0000 mg | ORAL_TABLET | Freq: Every day | ORAL | Status: DC

## 2013-08-13 NOTE — Telephone Encounter (Signed)
[facility] faxed Tarceva refill request.  Request to provider for review.

## 2013-08-13 NOTE — Telephone Encounter (Signed)
Faxed refill to [facility]

## 2013-08-14 ENCOUNTER — Encounter (HOSPITAL_COMMUNITY): Payer: Self-pay

## 2013-08-14 ENCOUNTER — Other Ambulatory Visit (HOSPITAL_BASED_OUTPATIENT_CLINIC_OR_DEPARTMENT_OTHER): Payer: Medicare Other | Admitting: Lab

## 2013-08-14 ENCOUNTER — Ambulatory Visit (HOSPITAL_COMMUNITY)
Admission: RE | Admit: 2013-08-14 | Discharge: 2013-08-14 | Disposition: A | Discharge status: Home / Self Care | Payer: Medicare Other | Source: Ambulatory Visit | Attending: Internal Medicine | Admitting: Internal Medicine

## 2013-08-14 DIAGNOSIS — I700 Atherosclerosis of aorta: Secondary | ICD-10-CM | POA: Insufficient documentation

## 2013-08-14 DIAGNOSIS — C3490 Malignant neoplasm of unspecified part of unspecified bronchus or lung: Secondary | ICD-10-CM

## 2013-08-14 DIAGNOSIS — Z79899 Other long term (current) drug therapy: Secondary | ICD-10-CM | POA: Insufficient documentation

## 2013-08-14 DIAGNOSIS — J90 Pleural effusion, not elsewhere classified: Secondary | ICD-10-CM | POA: Insufficient documentation

## 2013-08-14 DIAGNOSIS — N200 Calculus of kidney: Secondary | ICD-10-CM | POA: Insufficient documentation

## 2013-08-14 LAB — CBC WITH DIFFERENTIAL/PLATELET
BASO%: 0.8 % (ref 0.0–2.0)
LYMPH%: 10.6 % — ABNORMAL LOW (ref 14.0–49.7)
MCHC: 33.8 g/dL (ref 31.5–36.0)
MCV: 93.4 fL (ref 79.5–101.0)
MONO#: 0.5 10*3/uL (ref 0.1–0.9)
MONO%: 9 % (ref 0.0–14.0)
Platelets: 174 10*3/uL (ref 145–400)
RBC: 3.63 10*6/uL — ABNORMAL LOW (ref 3.70–5.45)
RDW: 14.3 % (ref 11.2–14.5)
WBC: 5.6 10*3/uL (ref 3.9–10.3)

## 2013-08-14 LAB — COMPREHENSIVE METABOLIC PANEL (CC13)
ALT: 12 U/L (ref 0–55)
Alkaline Phosphatase: 66 U/L (ref 40–150)
Potassium: 4.1 mEq/L (ref 3.5–5.1)
Sodium: 141 mEq/L (ref 136–145)
Total Bilirubin: 1.3 mg/dL — ABNORMAL HIGH (ref 0.20–1.20)
Total Protein: 5.8 g/dL — ABNORMAL LOW (ref 6.4–8.3)

## 2013-08-14 MED ORDER — IOHEXOL 300 MG/ML  SOLN
50.0000 mL | Freq: Once | INTRAMUSCULAR | Status: AC | PRN
  Administered 2013-08-14: 50 mL via ORAL

## 2013-08-15 NOTE — Telephone Encounter (Signed)
RECEIVED A FAX FROM [facility] CONCERNING A CONFIRMATION OF PRESCRIPTION SHIPMENT FOR [place] ON 08/14/13.

## 2013-08-19 ENCOUNTER — Telehealth: Payer: Self-pay | Admitting: Internal Medicine

## 2013-08-19 ENCOUNTER — Ambulatory Visit (HOSPITAL_BASED_OUTPATIENT_CLINIC_OR_DEPARTMENT_OTHER): Payer: Medicare Other | Admitting: Internal Medicine

## 2013-08-19 ENCOUNTER — Encounter: Payer: Self-pay | Admitting: Internal Medicine

## 2013-08-19 VITALS — BP 113/68 | HR 91 | Temp 97.2°F | Resp 18 | Ht 66.0 in | Wt 239.0 lb

## 2013-08-19 DIAGNOSIS — C3430 Malignant neoplasm of lower lobe, unspecified bronchus or lung: Secondary | ICD-10-CM

## 2013-08-19 DIAGNOSIS — C3490 Malignant neoplasm of unspecified part of unspecified bronchus or lung: Secondary | ICD-10-CM

## 2013-08-19 MED ORDER — DOXYCYCLINE HYCLATE 50 MG PO CAPS: 100.0000 mg | ORAL_CAPSULE | Freq: Two times a day (BID) | ORAL | Status: DC

## 2013-08-19 NOTE — Progress Notes (Signed)
[place] Telephone:(336) [phone]   Fax:(336) [phone]  OFFICE PROGRESS NOTE  [name], MD [address] [place] [address]  DIAGNOSIS: Recurrent non-small cell lung cancer, adenocarcinoma initially diagnosed as stage IIA in March 2008. The tumor cells were negative for EGFR mutation as well as ALK gene translocation.   PRIOR THERAPY:  1. Status post concurrent chemoradiation with weekly carboplatin and paclitaxel. Last dose was given Apr 12, 2007. 2. Status post 3 cycles of consolidation chemotherapy with docetaxel. Last dose was given July 12, 2007. 3. 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 4. Status post 39 cycles of maintenance Alimta at a dose of 500 mg/m2. Last dose was given February 25, 2010, discontinued secondary to persistent anemia, but the patient has stable disease.  CURRENT THERAPY: Tarceva 150 mg by mouth daily. Status post approximately12 months of therapy.   CHEMOTHERAPY INTENT: Palliative  CURRENT # OF CHEMOTHERAPY CYCLES: 12  CURRENT ANTIEMETICS: Compazine when necessary  CURRENT SMOKING STATUS: Former smoker quit 11/27/2001  ORAL CHEMOTHERAPY AND CONSENT: Tarceva  CURRENT BISPHOSPHONATES USE: None  PAIN MANAGEMENT: 0/10  NARCOTICS INDUCED CONSTIPATION: None  LIVING WILL AND CODE STATUS: Full code initially but no prolonged resuscitation.    INTERVAL HISTORY: Danita Taplin Cogar 72 y.o. female returns to the clinic today for followup visit. The patient is feeling fine today except for a total of his scabs on her head. She used a dandruf shampoo with little improvement. She was treated last month with a short course of doxycycline. The patient denied having any significant diarrhea. She has very mild skin rash on the face. She denied having any significant chest pain but continues to have shortness of breath with exertion with no cough or hemoptysis. She denied  having any significant weight loss or night sweats. The patient had repeat CT scan of the chest, abdomen and pelvis performed recently and she is here for evaluation and discussion of her scan results.  MEDICAL HISTORY: Past Medical History  Diagnosis Date  . Hypertension   . Renal insufficiency     stage 3 renal disease/ pt  . lung ca dx'd 02/2010  . 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  MEDICATIONS:  Current Outpatient Prescriptions  Medication Sig Dispense Refill  . acetaminophen (TYLENOL) 325 MG tablet Take 650 mg by mouth every 6 (six) hours as needed.        Marland Kitchen aspirin 81 MG tablet Take 81 mg by mouth daily.        . calcitRIOL (ROCALTROL) 0.25 MCG capsule Take 0.25 mcg by mouth every Monday, Wednesday, and Friday.      . clindamycin (CLEOCIN-T) 1 % external solution Apply topically 2 (two) times daily.  60 mL  1  . doxycycline (VIBRAMYCIN) 50 MG capsule Take 2 capsules (100 mg total) by mouth 2 (two) times daily.  14 capsule  0  . erlotinib (TARCEVA) 150 MG tablet Take 1 tablet (150 mg total) by mouth daily. Take on an empty stomach 1 hour before meals or 2 hours after.  30 tablet  2  . labetalol (NORMODYNE) 200 MG tablet Take 200 mg by mouth 2 (two) times daily.        . minocycline (MINOCIN,DYNACIN) 50 MG capsule Take 50 mg by mouth daily.       Marland Kitchen neomycin-polymyxin b-dexamethasone (MAXITROL) 3.5-10000-0.1 OINT       .  polyethylene glycol (MIRALAX / GLYCOLAX) packet Take 17 g by mouth daily.      Marland Kitchen torsemide (DEMADEX) 20 MG tablet Take 20 mg by mouth daily.         No current facility-administered medications for this visit.    SURGICAL HISTORY:  Past Surgical History  Procedure Laterality Date  . Portacath placement  11/03/08-[facility]    tip in mid SVC-E. Mansell    REVIEW OF SYSTEMS:  Constitutional: negative Eyes: negative Ears, nose, mouth, throat, and face: negative Respiratory: positive for dyspnea on  exertion Cardiovascular: negative Gastrointestinal: negative Genitourinary:negative Integument/breast: positive for dryness and rash Hematologic/lymphatic: negative Musculoskeletal:negative Neurological: negative Behavioral/Psych: negative Endocrine: negative Allergic/Immunologic: negative   PHYSICAL EXAMINATION: General appearance: alert, cooperative and no distress Head: Normocephalic, without obvious abnormality, atraumatic Neck: no adenopathy, no carotid bruit, no JVD, supple, symmetrical, trachea midline and thyroid not enlarged, symmetric, no tenderness/mass/nodules Lymph nodes: Cervical, supraclavicular, and axillary nodes normal. Resp: clear to auscultation bilaterally and normal percussion bilaterally Back: symmetric, no curvature. ROM normal. No CVA tenderness. Cardio: regular rate and rhythm, S1, S2 normal, no murmur, click, rub or gallop GI: soft, non-tender; bowel sounds normal; no masses,  no organomegaly Extremities: extremities normal, atraumatic, no cyanosis or edema Neurologic: Alert and oriented X 3, normal strength and tone. Normal symmetric reflexes. Normal coordination and gait  ECOG PERFORMANCE STATUS: 1 - Symptomatic but completely ambulatory  Blood pressure 113/68, pulse 91, temperature 97.2 F (36.2 C), temperature source Oral, resp. rate 18, height 5\' 6"  (1.676 m), weight 239 lb (108.41 kg).  LABORATORY DATA: Lab Results  Component Value Date   WBC 5.6 08/14/2013   HGB 11.4* 08/14/2013   HCT 33.9* 08/14/2013   MCV 93.4 08/14/2013   PLT 174 08/14/2013      Chemistry      Component Value Date/Time   NA 141 08/14/2013 0855   NA 137 11/14/2012 0816   NA 143 07/11/2012 0927   K 4.1 08/14/2013 0855   K 3.9 11/14/2012 0816   K 4.5 07/11/2012 0927   CL 102 05/08/2013 0851   CL 101 11/14/2012 0816   CL 101 07/11/2012 0927   CO2 29 08/14/2013 0855   CO2 28 11/14/2012 0816   CO2 29 07/11/2012 0927   BUN 14.6 08/14/2013 0855   BUN 17 11/14/2012 0816   BUN 16  07/11/2012 0927   CREATININE 1.5* 08/14/2013 0855   CREATININE 1.52* 11/14/2012 0816   CREATININE 1.5* 07/11/2012 0927      Component Value Date/Time   CALCIUM 9.8 08/14/2013 0855   CALCIUM 10.2 11/14/2012 0816   CALCIUM 10.0 07/11/2012 0927   ALKPHOS 66 08/14/2013 0855   ALKPHOS 74 11/14/2012 0816   ALKPHOS 71 07/11/2012 0927   AST 18 08/14/2013 0855   AST 16 11/14/2012 0816   AST 19 07/11/2012 0927   ALT 12 08/14/2013 0855   ALT 8 11/14/2012 0816   ALT 18 07/11/2012 0927   BILITOT 1.30* 08/14/2013 0855   BILITOT 1.2 11/14/2012 0816   BILITOT 0.80 07/11/2012 0927       RADIOGRAPHIC STUDIES: Ct Chest Wo Contrast  08/14/2013   *RADIOLOGY REPORT*  Clinical Data:  History of lung cancer diagnosed 2008, remote left parotid cancer.  Ongoing chemotherapy.  Asymptomatic.  CT CHEST, ABDOMEN AND PELVIS WITHOUT CONTRAST  Technique:  Multidetector CT imaging of the chest, abdomen and pelvis was performed following the standard protocol without IV contrast.  Comparison:  05/08/2013    CT CHEST  Findings:  Unenhanced CT was performed per clinician order.  Lack of IV contrast limits sensitivity and specificity, especially for evaluation of abdominal/pelvic solid viscera.  Left sided Port-A-Cath in place with tip in the proximal SVC. Heart size is normal.  Allowing for noncontrast technique, there is a similar degree of right perihilar soft tissue fullness extending to the superior segment right lower lobe, with a linear peripheral configuration most typical for radiation change, for example image 32.  Stable mixed density right subpulmonic pleural effusion.  Moderate atheromatous and coronary arterial calcification re-identified.  Emphysematous changes are re-identified.  Patchy areas of reticular and ground-glass pulmonary parenchymal opacity predominately in the right upper lobe and right middle lobe are stable.  No new pulmonary nodule, mass, or consolidation.  2 mm left lower lobe pulmonary nodule image 40 is  stable.  Attenuation of the right lower bronchi at the hilum and radiation changes are again noted.  IMPRESSION: No CT evidence for intrathoracic recurrence or acute abnormality. Presumed post treatment changes as above again noted.    CT ABDOMEN AND PELVIS  Findings:  Unenhanced liver, gallbladder, adrenal glands, left kidney, spleen, and pancreas are unremarkable.  1 mm nonobstructing right mid renal calculus image 66.  Moderate atheromatous aortic calcification without aneurysm.  No ascites or lymphadenopathy. Mild edema within the soft tissues of the back is present.  No bowel wall thickening or focal segmental dilatation.  The appendix is normal.  Uterus and ovaries are normal.  Disc degenerative changes are noted in the spine.  No new lytic or sclerotic osseous lesion is identified.  IMPRESSION:  No new evidence for intra-abdominal or pelvic metastatic disease or acute abnormality.   Original Report Authenticated By: Conchita Paris, M.D.   [place] [facility]  08/06/2013   *RADIOLOGY REPORT*  Clinical Data:  Screening recall for a possible right breast mass.  DIGITAL DIAGNOSTIC RIGHT MAMMOGRAM  Comparison: Previous mammograms.  Findings:  ACR Breast Density Category b:  There are scattered areas of fibroglandular density.  The initially questioned possible mass in the right breast resolves on the additional spot compression CC and true lateral images compatible with superimposed breast tissue.  There is no mammographic evidence of malignancy in the right breast.  CAD/BLANK  IMPRESSION: Initially questioned right breast mass resolves on additional imaging compatible with superimposed breast tissue.  There is no mammographic evidence of malignancy in the right breast.  RECOMMENDATION: Screening mammogram in one year. (Code:SM-B-01Y)  I have discussed the findings and recommendations with the patient. Results were also provided in writing at the conclusion of the visit.  If applicable, a reminder letter  will be sent to the patient regarding her next appointment.  BI-RADS CATEGORY 1:  Negative.   Original Report Authenticated By: Everlean Alstrom, M.D.   [facility] Digital Screening  08/01/2013   *RADIOLOGY REPORT*  Clinical data: Screening.  DIGITAL SCREENING BILATERAL MAMMOGRAM WITH CAD  Comparison:  Compared prior films  FINDINGS:  ACR Breast Density Category b:  The breast tissue is scattered fibroglandular.  In the right breast, a possible mass warrants further evaluation with spot compression views and possibly ultrasound.  In the left breast, there are no findings suspicious for malignancy.  Images were processed with CAD.  IMPRESSION: Further evaluation is suggested for possible mass in the right breast.  RECOMMENDATION: Diagnostic mammogram and possibly ultrasound of the right breast. (Code:FI-R-46M)  The patient will be contacted regarding the findings, and additional imaging will be scheduled.  BI-RADS CATEGORY 0:  Incomplete.  Need additional imaging evaluation and/or prior mammograms for comparison.   Original Report Authenticated By: Abelardo Diesel, M.D.    ASSESSMENT AND PLAN: This is a very pleasant 72 years old white female with metastatic non-small cell lung cancer currently on treatment with Tarceva 150 mg by mouth daily and tolerating it fairly well except for dry skin and scabs on the scalp. Her CT scan showed no evidence for disease progression. I discussed the scan results with the patient today. I recommended for her to continue treatment with Tarceva with the same dose. For the questionable scalp infection, I will start the patient on doxycycline 100 mg by mouth twice a day for 10 days. She would come back for followup visit in one month for reevaluation with repeat CBC and comprehensive metabolic panel. She was advised to call immediately if she has any concerning symptoms in the interval.  The patient voices understanding of current disease status and treatment options and is in agreement  with the current care plan.  All questions were answered. The patient knows to call the clinic with any problems, questions or concerns. We can certainly see the patient much sooner if necessary.  I spent 15 minutes counseling the patient face to face. The total time spent in the appointment was 25 minutes.

## 2013-08-19 NOTE — Patient Instructions (Signed)
CURRENT THERAPY: Tarceva 150 mg by mouth daily. Status post approximately12 months of therapy.   CHEMOTHERAPY INTENT: Palliative  CURRENT # OF CHEMOTHERAPY CYCLES: 12  CURRENT ANTIEMETICS: Compazine when necessary  CURRENT SMOKING STATUS: Former smoker quit 11/27/2001  ORAL CHEMOTHERAPY AND CONSENT: Tarceva  CURRENT BISPHOSPHONATES USE: None  PAIN MANAGEMENT: 0/10  NARCOTICS INDUCED CONSTIPATION: None  LIVING WILL AND CODE STATUS: Full code initially but no prolonged resuscitation.

## 2013-08-19 NOTE — Telephone Encounter (Signed)
gv and printed appt sched and avs for pt for Sept thru Dec

## 2013-08-22 ENCOUNTER — Ambulatory Visit (HOSPITAL_BASED_OUTPATIENT_CLINIC_OR_DEPARTMENT_OTHER): Payer: Medicare Other

## 2013-08-22 DIAGNOSIS — Z452 Encounter for adjustment and management of vascular access device: Secondary | ICD-10-CM

## 2013-08-22 DIAGNOSIS — C3430 Malignant neoplasm of lower lobe, unspecified bronchus or lung: Secondary | ICD-10-CM

## 2013-08-22 MED ORDER — HEPARIN SOD (PORK) LOCK FLUSH 100 UNIT/ML IV SOLN
500.0000 [IU] | Freq: Once | INTRAVENOUS | Status: AC
  Administered 2013-08-22: 500 [IU] via INTRAVENOUS
  Filled 2013-08-22: qty 5

## 2013-08-22 MED ORDER — SODIUM CHLORIDE 0.9 % IJ SOLN
10.0000 mL | INTRAMUSCULAR | Status: DC | PRN
  Administered 2013-08-22: 10 mL via INTRAVENOUS
  Filled 2013-08-22: qty 10

## 2013-08-22 NOTE — Patient Instructions (Addendum)
Implanted Port Instructions  An implanted port is a central line that has a round shape and is placed under the skin. It is used for long-term IV (intravenous) access for:  · Medicine.  · Fluids.  · Liquid nutrition, such as TPN (total parenteral nutrition).  · Blood samples.  Ports can be placed:  · In the chest area just below the collarbone (this is the most common place.)  · In the arms.  · In the belly (abdomen) area.  · In the legs.  PARTS OF THE PORT  A port has 2 main parts:  · The reservoir. The reservoir is round, disc-shaped, and will be a small, raised area under your skin.  · The reservoir is the part where a needle is inserted (accessed) to either give medicines or to draw blood.  · The catheter. The catheter is a long, slender tube that extends from the reservoir. The catheter is placed into a large vein.  · Medicine that is inserted into the reservoir goes into the catheter and then into the vein.  INSERTION OF THE PORT  · The port is surgically placed in either an operating room or in a procedural area (interventional radiology).  · Medicine may be given to help you relax during the procedure.  · The skin where the port will be inserted is numbed (local anesthetic).  · 1 or 2 small cuts (incisions) will be made in the skin to insert the port.  · The port can be used after it has been inserted.  INCISION SITE CARE  · The incision site may have small adhesive strips on it. This helps keep the incision site closed. Sometimes, no adhesive strips are placed. Instead of adhesive strips, a special kind of surgical glue is used to keep the incision closed.  · If adhesive strips were placed on the incision sites, do not take them off. They will fall off on their own.  · The incision site may be sore for 1 to 2 days. Pain medicine can help.  · Do not get the incision site wet. Bathe or shower as directed by your caregiver.  · The incision site should heal in 5 to 7 days. A small scar may form after the  incision has healed.  ACCESSING THE PORT  Special steps must be taken to access the port:  · Before the port is accessed, a numbing cream can be placed on the skin. This helps numb the skin over the port site.  · A sterile technique is used to access the port.  · The port is accessed with a needle. Only "non-coring" port needles should be used to access the port. Once the port is accessed, a blood return should be checked. This helps ensure the port is in the vein and is not clogged (clotted).  · If your caregiver believes your port should remain accessed, a clear (transparent) bandage will be placed over the needle site. The bandage and needle will need to be changed every week or as directed by your caregiver.  · Keep the bandage covering the needle clean and dry. Do not get it wet. Follow your caregiver's instructions on how to take a shower or bath when the port is accessed.  · If your port does not need to stay accessed, no bandage is needed over the port.  FLUSHING THE PORT  Flushing the port keeps it from getting clogged. How often the port is flushed depends on:  · If a   constant infusion is running. If a constant infusion is running, the port may not need to be flushed.  · If intermittent medicines are given.  · If the port is not being used.  For intermittent medicines:  · The port will need to be flushed:  · After medicines have been given.  · After blood has been drawn.  · As part of routine maintenance.  · A port is normally flushed with:  · Normal saline.  · Heparin.  · Follow your caregiver's advice on how often, how much, and the type of flush to use on your port.  IMPORTANT PORT INFORMATION  · Tell your caregiver if you are allergic to heparin.  · After your port is placed, you will get a manufacturer's information card. The card has information about your port. Keep this card with you at all times.  · There are many types of ports available. Know what kind of port you have.  · In case of an  emergency, it may be helpful to wear a medical alert bracelet. This can help alert health care workers that you have a port.  · The port can stay in for as long as your caregiver believes it is necessary.  · When it is time for the port to come out, surgery will be done to remove it. The surgery will be similar to how the port was put in.  · If you are in the hospital or clinic:  · Your port will be taken care of and flushed by a nurse.  · If you are at home:  · A home health care nurse may give medicines and take care of the port.  · You or a family member can get special training and directions for giving medicine and taking care of the port at home.  SEEK IMMEDIATE MEDICAL CARE IF:   · Your port does not flush or you are unable to get a blood return.  · New drainage or pus is coming from the incision.  · A bad smell is coming from the incision site.  · You develop swelling or increased redness at the incision site.  · You develop increased swelling or pain at the port site.  · You develop swelling or pain in the surrounding skin near the port.  · You have an oral temperature above 102° F (38.9° C), not controlled by medicine.  MAKE SURE YOU:   · Understand these instructions.  · Will watch your condition.  · Will get help right away if you are not doing well or get worse.  Document Released: 11/13/2005 Document Revised: 02/05/2012 Document Reviewed: 02/04/2009  [employer]® Patient Information ©2014 [employer].

## 2013-08-25 ENCOUNTER — Encounter: Payer: Self-pay | Admitting: Internal Medicine

## 2013-08-25 NOTE — Progress Notes (Signed)
Per PAN patient was approve for Tarceva and I will send to billing and medical records.

## 2013-09-12 ENCOUNTER — Encounter: Payer: Self-pay | Admitting: Internal Medicine

## 2013-09-12 NOTE — Progress Notes (Signed)
[facility], [id number], approved tarceva from 09/12/13-03/13/14.

## 2013-09-16 ENCOUNTER — Ambulatory Visit (HOSPITAL_BASED_OUTPATIENT_CLINIC_OR_DEPARTMENT_OTHER): Payer: Medicare Other | Admitting: Physician Assistant

## 2013-09-16 ENCOUNTER — Telehealth: Payer: Self-pay | Admitting: Internal Medicine

## 2013-09-16 ENCOUNTER — Encounter (INDEPENDENT_AMBULATORY_CARE_PROVIDER_SITE_OTHER): Payer: Self-pay

## 2013-09-16 ENCOUNTER — Other Ambulatory Visit (HOSPITAL_BASED_OUTPATIENT_CLINIC_OR_DEPARTMENT_OTHER): Payer: Medicare Other | Admitting: Lab

## 2013-09-16 ENCOUNTER — Encounter: Payer: Self-pay | Admitting: Physician Assistant

## 2013-09-16 VITALS — BP 106/63 | HR 83 | Temp 96.9°F | Resp 20 | Ht 66.0 in | Wt 231.3 lb

## 2013-09-16 DIAGNOSIS — C3490 Malignant neoplasm of unspecified part of unspecified bronchus or lung: Secondary | ICD-10-CM

## 2013-09-16 DIAGNOSIS — R21 Rash and other nonspecific skin eruption: Secondary | ICD-10-CM

## 2013-09-16 DIAGNOSIS — R112 Nausea with vomiting, unspecified: Secondary | ICD-10-CM

## 2013-09-16 DIAGNOSIS — C3430 Malignant neoplasm of lower lobe, unspecified bronchus or lung: Secondary | ICD-10-CM

## 2013-09-16 LAB — CBC WITH DIFFERENTIAL/PLATELET
BASO%: 0.9 % (ref 0.0–2.0)
EOS%: 3.4 % (ref 0.0–7.0)
MCH: 31.8 pg (ref 25.1–34.0)
MCHC: 33.8 g/dL (ref 31.5–36.0)
MONO#: 0.5 10*3/uL (ref 0.1–0.9)
NEUT%: 79 % — ABNORMAL HIGH (ref 38.4–76.8)
RBC: 3.73 10*6/uL (ref 3.70–5.45)
WBC: 6.8 10*3/uL (ref 3.9–10.3)
lymph#: 0.6 10*3/uL — ABNORMAL LOW (ref 0.9–3.3)

## 2013-09-16 LAB — COMPREHENSIVE METABOLIC PANEL (CC13)
ALT: 11 U/L (ref 0–55)
AST: 15 U/L (ref 5–34)
Anion Gap: 10 mEq/L (ref 3–11)
CO2: 27 mEq/L (ref 22–29)
Calcium: 9.8 mg/dL (ref 8.4–10.4)
Chloride: 101 mEq/L (ref 98–109)
Creatinine: 1.7 mg/dL — ABNORMAL HIGH (ref 0.6–1.1)
Sodium: 139 mEq/L (ref 136–145)
Total Bilirubin: 2.06 mg/dL — ABNORMAL HIGH (ref 0.20–1.20)
Total Protein: 5.9 g/dL — ABNORMAL LOW (ref 6.4–8.3)

## 2013-09-16 MED ORDER — ONDANSETRON HCL 8 MG PO TABS: 8.0000 mg | ORAL_TABLET | Freq: Three times a day (TID) | ORAL | Status: DC | PRN

## 2013-09-16 NOTE — Progress Notes (Signed)
[facility] [phone]   [phone]  OFFICE PROGRESS NOTE  [name], MD [address] [place] [address]  DIAGNOSIS: Recurrent non-small cell lung cancer, adenocarcinoma initially diagnosed as stage IIA in March 2008. The tumor cells were negative for EGFR mutation as well as ALK gene translocation.   PRIOR THERAPY:  1. Status post concurrent chemoradiation with weekly carboplatin and paclitaxel. Last dose was given Apr 12, 2007. 2. Status post 3 cycles of consolidation chemotherapy with docetaxel. Last dose was given July 12, 2007. 3. 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 4. Status post 39 cycles of maintenance Alimta at a dose of 500 mg/m2. Last dose was given February 25, 2010, discontinued secondary to persistent anemia, but the patient has stable disease.  CURRENT THERAPY: Tarceva 150 mg by mouth daily. Status post approximately13 months of therapy.   CHEMOTHERAPY INTENT: Palliative  CURRENT # OF CHEMOTHERAPY CYCLES: 13  CURRENT ANTIEMETICS: Compazine when necessary  CURRENT SMOKING STATUS: Former smoker quit 11/27/2001  ORAL CHEMOTHERAPY AND CONSENT: Tarceva  CURRENT BISPHOSPHONATES USE: None  PAIN MANAGEMENT: 0/10  NARCOTICS INDUCED CONSTIPATION: None  LIVING WILL AND CODE STATUS: Full code initially but no prolonged resuscitation.    INTERVAL HISTORY: Diane [name] 72 y.o. female returns to the clinic today for followup visit. The patient is feeling fine today except for persistent scabs on her head. She used a dandruf shampoo with little improvement. She was treated last month with a short course of doxycycline. She states she saw her primary care physician who placed her on another course of doxycycline. She complains of having significant nausea and vomiting over the past week. She currently does not have an antiemetic at home. She remembers  having a bad taste from Compazine when she was on chemotherapy before and would like to try something different for nausea if possible. She states that the diarrhea from the [place] has improved.  She has very mild skin rash on the face. She denied having any significant chest pain but continues to have shortness of breath with exertion with no cough or hemoptysis. She denied having any significant weight loss or night sweats.   MEDICAL HISTORY: Past Medical History  Diagnosis Date  . Hypertension   . Renal insufficiency     stage 3 renal disease/ pt  . lung ca dx'd 02/2010  . 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  MEDICATIONS:  Current Outpatient Prescriptions  Medication Sig Dispense Refill  . acetaminophen (TYLENOL) 325 MG tablet Take 650 mg by mouth every 6 (six) hours as needed.        Marland Kitchen aspirin 81 MG tablet Take 81 mg by mouth daily.        . calcitRIOL (ROCALTROL) 0.25 MCG capsule Take 0.25 mcg by mouth every Monday, Wednesday, and Friday.      . clindamycin (CLEOCIN-T) 1 % external solution Apply topically 2 (two) times daily.  60 mL  1  . erlotinib (TARCEVA) 150 MG tablet Take 1 tablet (150 mg total) by mouth daily. Take on an empty stomach 1 hour before meals or 2 hours after.  30 tablet  2  . labetalol (NORMODYNE) 200 MG tablet Take 200 mg by mouth 2 (two) times daily.        . ondansetron (ZOFRAN) 8 MG tablet Take 1 tablet (8 mg total) by mouth  every 8 (eight) hours as needed for nausea.  20 tablet  2  . polyethylene glycol (MIRALAX / GLYCOLAX) packet Take 17 g by mouth daily.      Marland Kitchen torsemide (DEMADEX) 20 MG tablet Take 20 mg by mouth daily.         No current facility-administered medications for this visit.    SURGICAL HISTORY:  Past Surgical History  Procedure Laterality Date  . Portacath placement  11/03/08-[facility]    tip in mid SVC-E. [facility]    REVIEW OF SYSTEMS:  Constitutional: negative Eyes: negative Ears, nose,  mouth, throat, and face: negative Respiratory: positive for dyspnea on exertion Cardiovascular: negative Gastrointestinal: positive for diarrhea, nausea and vomiting Genitourinary:negative Integument/breast: positive for dryness and rash Hematologic/lymphatic: negative Musculoskeletal:negative Neurological: negative Behavioral/Psych: negative Endocrine: negative Allergic/Immunologic: negative   PHYSICAL EXAMINATION: General appearance: alert, cooperative and no distress Head: Normocephalic, without obvious abnormality, atraumatic Neck: no adenopathy, no carotid bruit, no JVD, supple, symmetrical, trachea midline and thyroid not enlarged, symmetric, no tenderness/mass/nodules Lymph nodes: Cervical, supraclavicular, and axillary nodes normal. Resp: clear to auscultation bilaterally and normal percussion bilaterally Back: symmetric, no curvature. ROM normal. No CVA tenderness. Cardio: regular rate and rhythm, S1, S2 normal, no murmur, click, rub or gallop GI: soft, non-tender; bowel sounds normal; no masses,  no organomegaly Extremities: extremities normal, atraumatic, no cyanosis or edema Neurologic: Alert and oriented X 3, normal strength and tone. Normal symmetric reflexes. Normal coordination and gait Scalp: large raised tender scabs scattered through out the scalp with the greatest concentration on the left  ECOG PERFORMANCE STATUS: 1 - Symptomatic but completely ambulatory  Blood pressure 106/63, pulse 83, temperature 96.9 F (36.1 C), temperature source Oral, resp. rate 20, height 5\' 6"  (1.676 m), weight 231 lb 4.8 oz (104.917 kg).  LABORATORY DATA: Lab Results  Component Value Date   WBC 6.8 09/16/2013   HGB 11.9 09/16/2013   HCT 35.1 09/16/2013   MCV 94.0 09/16/2013   PLT 194 09/16/2013      Chemistry      Component Value Date/Time   NA 139 09/16/2013 0856   NA 137 11/14/2012 0816   NA 143 07/11/2012 0927   K 3.3* 09/16/2013 0856   K 3.9 11/14/2012 0816   K 4.5  07/11/2012 0927   CL 102 05/08/2013 0851   CL 101 11/14/2012 0816   CL 101 07/11/2012 0927   CO2 27 09/16/2013 0856   CO2 28 11/14/2012 0816   CO2 29 07/11/2012 0927   BUN 25.4 09/16/2013 0856   BUN 17 11/14/2012 0816   BUN 16 07/11/2012 0927   CREATININE 1.7* 09/16/2013 0856   CREATININE 1.52* 11/14/2012 0816   CREATININE 1.5* 07/11/2012 0927      Component Value Date/Time   CALCIUM 9.8 09/16/2013 0856   CALCIUM 10.2 11/14/2012 0816   CALCIUM 10.0 07/11/2012 0927   ALKPHOS 83 09/16/2013 0856   ALKPHOS 74 11/14/2012 0816   ALKPHOS 71 07/11/2012 0927   AST 15 09/16/2013 0856   AST 16 11/14/2012 0816   AST 19 07/11/2012 0927   ALT 11 09/16/2013 0856   ALT 8 11/14/2012 0816   ALT 18 07/11/2012 0927   BILITOT 2.06* 09/16/2013 0856   BILITOT 1.2 11/14/2012 0816   BILITOT 0.80 07/11/2012 0927       RADIOGRAPHIC STUDIES: Ct Chest Wo Contrast  08/14/2013   *RADIOLOGY REPORT*  Clinical Data:  History of lung cancer diagnosed 2008, remote left parotid cancer.  Ongoing chemotherapy.  Asymptomatic.  CT  CHEST, ABDOMEN AND PELVIS WITHOUT CONTRAST  Technique:  Multidetector CT imaging of the chest, abdomen and pelvis was performed following the standard protocol without IV contrast.  Comparison:  05/08/2013    CT CHEST  Findings:  Unenhanced CT was performed per clinician order.  Lack of IV contrast limits sensitivity and specificity, especially for evaluation of abdominal/pelvic solid viscera.  Left sided Port-A-Cath in place with tip in the proximal SVC. Heart size is normal.  Allowing for noncontrast technique, there is a similar degree of right perihilar soft tissue fullness extending to the superior segment right lower lobe, with a linear peripheral configuration most typical for radiation change, for example image 32.  Stable mixed density right subpulmonic pleural effusion.  Moderate atheromatous and coronary arterial calcification re-identified.  Emphysematous changes are re-identified.  Patchy  areas of reticular and ground-glass pulmonary parenchymal opacity predominately in the right upper lobe and right middle lobe are stable.  No new pulmonary nodule, mass, or consolidation.  2 mm left lower lobe pulmonary nodule image 40 is stable.  Attenuation of the right lower bronchi at the hilum and radiation changes are again noted.  IMPRESSION: No CT evidence for intrathoracic recurrence or acute abnormality. Presumed post treatment changes as above again noted.    CT ABDOMEN AND PELVIS  Findings:  Unenhanced liver, gallbladder, adrenal glands, left kidney, spleen, and pancreas are unremarkable.  1 mm nonobstructing right mid renal calculus image 66.  Moderate atheromatous aortic calcification without aneurysm.  No ascites or lymphadenopathy. Mild edema within the soft tissues of the back is present.  No bowel wall thickening or focal segmental dilatation.  The appendix is normal.  Uterus and ovaries are normal.  Disc degenerative changes are noted in the spine.  No new lytic or sclerotic osseous lesion is identified.  IMPRESSION:  No new evidence for intra-abdominal or pelvic metastatic disease or acute abnormality.   Original Report Authenticated By: Conchita Paris, M.D.   Diane [name]  08/06/2013   *RADIOLOGY REPORT*  Clinical Data:  Screening recall for a possible right breast mass.  DIGITAL DIAGNOSTIC RIGHT MAMMOGRAM  Comparison: Previous mammograms.  Findings:  ACR Breast Density Category b:  There are scattered areas of fibroglandular density.  The initially questioned possible mass in the right breast resolves on the additional spot compression CC and true lateral images compatible with superimposed breast tissue.  There is no mammographic evidence of malignancy in the right breast.  CAD/BLANK  IMPRESSION: Initially questioned right breast mass resolves on additional imaging compatible with superimposed breast tissue.  There is no mammographic evidence of malignancy in the right breast.   RECOMMENDATION: Screening mammogram in one year. (Code:SM-B-01Y)  I have discussed the findings and recommendations with the patient. Results were also provided in writing at the conclusion of the visit.  If applicable, a reminder letter will be sent to the patient regarding her next appointment.  BI-RADS CATEGORY 1:  Negative.   Original Report Authenticated By: Everlean Alstrom, M.D.   Mm Digital Screening  08/01/2013   *RADIOLOGY REPORT*  Clinical data: Screening.  DIGITAL SCREENING BILATERAL MAMMOGRAM WITH CAD  Comparison:  Compared prior films  FINDINGS:  ACR Breast Density Category b:  The breast tissue is scattered fibroglandular.  In the right breast, a possible mass warrants further evaluation with spot compression views and possibly ultrasound.  In the left breast, there are no findings suspicious for malignancy.  Images were processed with CAD.  IMPRESSION: Further evaluation is suggested for  possible mass in the right breast.  RECOMMENDATION: Diagnostic mammogram and possibly ultrasound of the right breast. (Code:[name])  The patient will be contacted regarding the findings, and additional imaging will be scheduled.  BI-RADS CATEGORY 0:  Incomplete.  Need additional imaging evaluation and/or prior mammograms for comparison.   Original Report Authenticated By: Abelardo Diesel, M.D.    ASSESSMENT AND PLAN: This is a very pleasant 72 years old white female with metastatic non-small cell lung cancer currently on treatment with Tarceva 150 mg by mouth daily and tolerating it fairly well except for dry skin and scabs on the scalp. She is now status post 30 months of therapy Her CT scan showed no evidence for disease progression. She will continue on Tarceva at the current dose. For the lesions on the scalp, I've recommended that she use the clindamycin solution in these areas. For nausea a prescription for Zofran 8 mg by mouth every 8 hours as needed for the nausea was sent to her pharmacy of record via E  scribed. She'll followup in one month for another symptom management visit with a repeat CBC differential and C. Met.  Wynetta Emery, Raji Glinski E, PA-C   She was advised to call immediately if she has any concerning symptoms in the interval.  The patient voices understanding of current disease status and treatment options and is in agreement with the current care plan.  All questions were answered. The patient knows to call the clinic with any problems, questions or concerns. We can certainly see the patient much sooner if necessary.  I spent 25 minutes counseling the patient face to face. The total time spent in the appointment was 35 minutes.

## 2013-09-16 NOTE — Telephone Encounter (Signed)
gv and printed appt sched and avs for pt for NOV °

## 2013-09-17 NOTE — Patient Instructions (Signed)
Continue taking Tarceva 150 mg by mouth daily Use the clindamycin solution on your scalp also Take the Zofran as prescribed for nausea and notify us if the nausea persists or worsens Follow up in 1 month

## 2013-10-10 ENCOUNTER — Other Ambulatory Visit: Payer: Self-pay | Admitting: *Deleted

## 2013-10-10 NOTE — Telephone Encounter (Signed)
THIS REFILL REQUEST WAS PLACED ON DR.MOHAMED'S DESK.

## 2013-10-14 ENCOUNTER — Ambulatory Visit (HOSPITAL_BASED_OUTPATIENT_CLINIC_OR_DEPARTMENT_OTHER): Payer: Medicare Other

## 2013-10-14 ENCOUNTER — Other Ambulatory Visit: Payer: Self-pay | Admitting: Medical Oncology

## 2013-10-14 ENCOUNTER — Encounter: Payer: Self-pay | Admitting: Internal Medicine

## 2013-10-14 ENCOUNTER — Telehealth: Payer: Self-pay | Admitting: Internal Medicine

## 2013-10-14 ENCOUNTER — Telehealth: Payer: Self-pay | Admitting: Medical Oncology

## 2013-10-14 ENCOUNTER — Ambulatory Visit (HOSPITAL_BASED_OUTPATIENT_CLINIC_OR_DEPARTMENT_OTHER): Payer: Medicare Other | Admitting: Internal Medicine

## 2013-10-14 ENCOUNTER — Other Ambulatory Visit (HOSPITAL_BASED_OUTPATIENT_CLINIC_OR_DEPARTMENT_OTHER): Payer: Medicare Other | Admitting: Lab

## 2013-10-14 ENCOUNTER — Other Ambulatory Visit: Payer: Self-pay | Admitting: Physician Assistant

## 2013-10-14 VITALS — BP 107/41 | HR 87 | Temp 97.5°F | Resp 19 | Ht 66.0 in | Wt 233.9 lb

## 2013-10-14 VITALS — BP 105/55 | HR 81 | Temp 97.1°F | Resp 18

## 2013-10-14 DIAGNOSIS — Z452 Encounter for adjustment and management of vascular access device: Secondary | ICD-10-CM

## 2013-10-14 DIAGNOSIS — R21 Rash and other nonspecific skin eruption: Secondary | ICD-10-CM

## 2013-10-14 DIAGNOSIS — J910 Malignant pleural effusion: Secondary | ICD-10-CM

## 2013-10-14 DIAGNOSIS — C3490 Malignant neoplasm of unspecified part of unspecified bronchus or lung: Secondary | ICD-10-CM

## 2013-10-14 DIAGNOSIS — C3430 Malignant neoplasm of lower lobe, unspecified bronchus or lung: Secondary | ICD-10-CM

## 2013-10-14 DIAGNOSIS — Z95828 Presence of other vascular implants and grafts: Secondary | ICD-10-CM

## 2013-10-14 DIAGNOSIS — L989 Disorder of the skin and subcutaneous tissue, unspecified: Secondary | ICD-10-CM

## 2013-10-14 LAB — COMPREHENSIVE METABOLIC PANEL (CC13)
ALT: 8 U/L (ref 0–55)
AST: 14 U/L (ref 5–34)
Alkaline Phosphatase: 66 U/L (ref 40–150)
BUN: 18.6 mg/dL (ref 7.0–26.0)
Creatinine: 1.5 mg/dL — ABNORMAL HIGH (ref 0.6–1.1)
Total Bilirubin: 1.54 mg/dL — ABNORMAL HIGH (ref 0.20–1.20)

## 2013-10-14 LAB — CBC WITH DIFFERENTIAL/PLATELET
BASO%: 0.6 % (ref 0.0–2.0)
Basophils Absolute: 0 10*3/uL (ref 0.0–0.1)
EOS%: 3.8 % (ref 0.0–7.0)
HCT: 32.9 % — ABNORMAL LOW (ref 34.8–46.6)
HGB: 11.4 g/dL — ABNORMAL LOW (ref 11.6–15.9)
MCH: 32.5 pg (ref 25.1–34.0)
MCHC: 34.5 g/dL (ref 31.5–36.0)
MCV: 94.4 fL (ref 79.5–101.0)
MONO%: 8.3 % (ref 0.0–14.0)
NEUT%: 77.5 % — ABNORMAL HIGH (ref 38.4–76.8)

## 2013-10-14 MED ORDER — HEPARIN SOD (PORK) LOCK FLUSH 100 UNIT/ML IV SOLN
500.0000 [IU] | Freq: Once | INTRAVENOUS | Status: AC
  Administered 2013-10-14: 500 [IU] via INTRAVENOUS
  Filled 2013-10-14: qty 5

## 2013-10-14 MED ORDER — SODIUM CHLORIDE 0.9 % IJ SOLN
10.0000 mL | INTRAMUSCULAR | Status: AC | PRN
  Administered 2013-10-14: 10 mL via INTRAVENOUS
  Filled 2013-10-14: qty 10

## 2013-10-14 MED ORDER — CLINDAMYCIN PHOSPHATE 1 % EX SOLN: Freq: Two times a day (BID) | CUTANEOUS | Status: DC

## 2013-10-14 MED ORDER — ERLOTINIB HCL 150 MG PO TABS: 150.0000 mg | ORAL_TABLET | Freq: Every day | ORAL | Status: DC

## 2013-10-14 NOTE — Telephone Encounter (Signed)
Called in cleocin refill.

## 2013-10-14 NOTE — Progress Notes (Signed)
Pt stated she had influenza vaccine in October at Dr Tretha Sciara office ( [place])

## 2013-10-14 NOTE — Patient Instructions (Signed)
CURRENT THERAPY: Tarceva 150 mg by mouth daily. Status post approximately14 months of therapy.   CHEMOTHERAPY INTENT: Palliative  CURRENT # OF CHEMOTHERAPY CYCLES: 14  CURRENT ANTIEMETICS: Compazine when necessary  CURRENT SMOKING STATUS: Former smoker quit 11/27/2001  ORAL CHEMOTHERAPY AND CONSENT: Tarceva  CURRENT BISPHOSPHONATES USE: None  PAIN MANAGEMENT: 0/10  NARCOTICS INDUCED CONSTIPATION: None  LIVING WILL AND CODE STATUS: Full code initially but no prolonged resuscitation.

## 2013-10-14 NOTE — Telephone Encounter (Signed)
Called in refill for cleocin.

## 2013-10-14 NOTE — Progress Notes (Signed)
[place] [phone]   [phone]  OFFICE PROGRESS NOTE  [name], MD [address] [place] [address]  DIAGNOSIS: Recurrent non-small cell lung cancer, adenocarcinoma initially diagnosed as stage IIA in March 2008. The tumor cells were negative for EGFR mutation as well as ALK gene translocation.   PRIOR THERAPY:  1. Status post concurrent chemoradiation with weekly carboplatin and paclitaxel. Last dose was given Apr 12, 2007. 2. Status post 3 cycles of consolidation chemotherapy with docetaxel. Last dose was given July 12, 2007. 3. 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 4. Status post 39 cycles of maintenance Alimta at a dose of 500 mg/m2. Last dose was given February 25, 2010, discontinued secondary to persistent anemia, but the patient has stable disease.  CURRENT THERAPY: Tarceva 150 mg by mouth daily. Status post approximately14 months of therapy.   CHEMOTHERAPY INTENT: Palliative  CURRENT # OF CHEMOTHERAPY CYCLES: 14  CURRENT ANTIEMETICS: Compazine when necessary  CURRENT SMOKING STATUS: Former smoker quit 11/27/2001  ORAL CHEMOTHERAPY AND CONSENT: Tarceva  CURRENT BISPHOSPHONATES USE: None  PAIN MANAGEMENT: 0/10  NARCOTICS INDUCED CONSTIPATION: None  LIVING WILL AND CODE STATUS: Full code initially but no prolonged resuscitation.    INTERVAL HISTORY: Zyeria Hayre Muma 72 y.o. female returns to the clinic today for followup visit. The patient is feeling fine today except for few scabs on her head. She used a dandruf shampoo with little improvement. She was treated last month with a short course of doxycycline by her primary care physician but did not tolerate that well. The patient denied having any significant diarrhea. She has very mild skin rash on the face. She denied having any significant chest pain but continues to have shortness of breath  with exertion with no cough or hemoptysis. She denied having any significant weight loss or night sweats.   MEDICAL HISTORY: Past Medical History  Diagnosis Date  . Hypertension   . Renal insufficiency     stage 3 renal disease/ pt  . lung ca dx'd 02/2010  . 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  MEDICATIONS:  Current Outpatient Prescriptions  Medication Sig Dispense Refill  . acetaminophen (TYLENOL) 325 MG tablet Take 650 mg by mouth every 6 (six) hours as needed.        Marland Kitchen aspirin 81 MG tablet Take 81 mg by mouth daily.        . calcitRIOL (ROCALTROL) 0.25 MCG capsule Take 0.25 mcg by mouth every Monday, Wednesday, and Friday.      . erlotinib (TARCEVA) 150 MG tablet Take 1 tablet (150 mg total) by mouth daily. Take on an empty stomach 1 hour before meals or 2 hours after.  30 tablet  2  . labetalol (NORMODYNE) 200 MG tablet Take 200 mg by mouth 2 (two) times daily.        . ondansetron (ZOFRAN) 8 MG tablet Take 1 tablet (8 mg total) by mouth every 8 (eight) hours as needed for nausea.  20 tablet  2  . polyethylene glycol (MIRALAX / GLYCOLAX) packet Take 17 g by mouth daily.      Marland Kitchen torsemide (DEMADEX) 20 MG tablet Take 20 mg by mouth daily.        . clindamycin (CLEOCIN-T) 1 % external solution Apply topically 2 (two) times daily. Apply as directed  60 mL  1  No current facility-administered medications for this visit.   Facility-Administered Medications Ordered in Other Visits  Medication Dose Route Frequency Provider Last Rate Last Dose  . sodium chloride 0.9 % injection 10 mL  10 mL Intravenous PRN Curt Bears, MD   10 mL at 10/14/13 1030    SURGICAL HISTORY:  Past Surgical History  Procedure Laterality Date  . Portacath placement  11/03/08-[facility]    tip in mid SVC-E. Mansell    REVIEW OF SYSTEMS:  Constitutional: negative Eyes: negative Ears, nose, mouth, throat, and face: negative Respiratory: positive for dyspnea on  exertion Cardiovascular: negative Gastrointestinal: negative Genitourinary:negative Integument/breast: positive for dryness and rash Hematologic/lymphatic: negative Musculoskeletal:negative Neurological: negative Behavioral/Psych: negative Endocrine: negative Allergic/Immunologic: negative   PHYSICAL EXAMINATION: General appearance: alert, cooperative and no distress Head: Normocephalic, without obvious abnormality, atraumatic Neck: no adenopathy, no carotid bruit, no JVD, supple, symmetrical, trachea midline and thyroid not enlarged, symmetric, no tenderness/mass/nodules Lymph nodes: Cervical, supraclavicular, and axillary nodes normal. Resp: clear to auscultation bilaterally and normal percussion bilaterally Back: symmetric, no curvature. ROM normal. No CVA tenderness. Cardio: regular rate and rhythm, S1, S2 normal, no murmur, click, rub or gallop GI: soft, non-tender; bowel sounds normal; no masses,  no organomegaly Extremities: extremities normal, atraumatic, no cyanosis or edema Neurologic: Alert and oriented X 3, normal strength and tone. Normal symmetric reflexes. Normal coordination and gait  ECOG PERFORMANCE STATUS: 1 - Symptomatic but completely ambulatory  Blood pressure 107/41, pulse 87, temperature 97.5 F (36.4 C), temperature source Oral, resp. rate 19, height 5\' 6"  (1.676 m), weight 233 lb 14.4 oz (106.096 kg).  LABORATORY DATA: Lab Results  Component Value Date   WBC 5.5 10/14/2013   HGB 11.4* 10/14/2013   HCT 32.9* 10/14/2013   MCV 94.4 10/14/2013   PLT 182 10/14/2013      Chemistry      Component Value Date/Time   NA 140 10/14/2013 0953   NA 137 11/14/2012 0816   NA 143 07/11/2012 0927   K 3.7 10/14/2013 0953   K 3.9 11/14/2012 0816   K 4.5 07/11/2012 0927   CL 102 05/08/2013 0851   CL 101 11/14/2012 0816   CL 101 07/11/2012 0927   CO2 25 10/14/2013 0953   CO2 28 11/14/2012 0816   CO2 29 07/11/2012 0927   BUN 18.6 10/14/2013 0953   BUN 17 11/14/2012  0816   BUN 16 07/11/2012 0927   CREATININE 1.5* 10/14/2013 0953   CREATININE 1.52* 11/14/2012 0816   CREATININE 1.5* 07/11/2012 0927      Component Value Date/Time   CALCIUM 9.6 10/14/2013 0953   CALCIUM 10.2 11/14/2012 0816   CALCIUM 10.0 07/11/2012 0927   ALKPHOS 66 10/14/2013 0953   ALKPHOS 74 11/14/2012 0816   ALKPHOS 71 07/11/2012 0927   AST 14 10/14/2013 0953   AST 16 11/14/2012 0816   AST 19 07/11/2012 0927   ALT 8 10/14/2013 0953   ALT 8 11/14/2012 0816   ALT 18 07/11/2012 0927   BILITOT 1.54* 10/14/2013 0953   BILITOT 1.2 11/14/2012 0816   BILITOT 0.80 07/11/2012 0927       RADIOGRAPHIC STUDIES:  ASSESSMENT AND PLAN: This is a very pleasant 72 years old white female with metastatic non-small cell lung cancer currently on treatment with Tarceva 150 mg by mouth daily status post 14 months of treatment and tolerating it fairly well except for dry skin and scabs on the scalp.  For the questionable scalp infection, she will continue to apply clindamycin lotion  to these areas. I recommended for the patient to continue her current treatment with Tarceva 150 mg by mouth daily. I would see her back for followup visit in one month with repeat CT scan of the chest, abdomen and pelvis for restaging of her disease. She would come back for followup visit in one month for reevaluation with repeat CBC and comprehensive metabolic panel. She was advised to call immediately if she has any concerning symptoms in the interval.  The patient voices understanding of current disease status and treatment options and is in agreement with the current care plan.  All questions were answered. The patient knows to call the clinic with any problems, questions or concerns. We can certainly see the patient much sooner if necessary.

## 2013-10-14 NOTE — Telephone Encounter (Signed)
Gave pt appt for lab and md  for December 2014

## 2013-10-14 NOTE — Telephone Encounter (Signed)
I called tarceva refill  in to [facility].

## 2013-10-15 ENCOUNTER — Telehealth: Payer: Self-pay | Admitting: *Deleted

## 2013-10-15 ENCOUNTER — Telehealth: Payer: Self-pay | Admitting: Medical Oncology

## 2013-10-15 NOTE — Telephone Encounter (Signed)
Received confirmation-Tarceva shipped on 10/14/13

## 2013-10-15 NOTE — Telephone Encounter (Signed)
I faxed tarceva refill to [facility].

## 2013-11-11 ENCOUNTER — Other Ambulatory Visit: Payer: Self-pay | Admitting: *Deleted

## 2013-11-11 DIAGNOSIS — C3490 Malignant neoplasm of unspecified part of unspecified bronchus or lung: Secondary | ICD-10-CM

## 2013-11-12 ENCOUNTER — Encounter (HOSPITAL_COMMUNITY): Payer: Self-pay

## 2013-11-12 ENCOUNTER — Ambulatory Visit (HOSPITAL_COMMUNITY): Payer: Medicare Other

## 2013-11-12 ENCOUNTER — Other Ambulatory Visit: Payer: Self-pay | Admitting: Internal Medicine

## 2013-11-12 ENCOUNTER — Encounter: Payer: Self-pay | Admitting: *Deleted

## 2013-11-12 ENCOUNTER — Ambulatory Visit (HOSPITAL_COMMUNITY)
Admission: RE | Admit: 2013-11-12 | Discharge: 2013-11-12 | Disposition: A | Discharge status: Home / Self Care | Payer: Medicare Other | Source: Ambulatory Visit | Attending: Internal Medicine | Admitting: Internal Medicine

## 2013-11-12 ENCOUNTER — Other Ambulatory Visit (HOSPITAL_BASED_OUTPATIENT_CLINIC_OR_DEPARTMENT_OTHER): Payer: Medicare Other

## 2013-11-12 DIAGNOSIS — N200 Calculus of kidney: Secondary | ICD-10-CM | POA: Insufficient documentation

## 2013-11-12 DIAGNOSIS — R918 Other nonspecific abnormal finding of lung field: Secondary | ICD-10-CM | POA: Insufficient documentation

## 2013-11-12 DIAGNOSIS — M47814 Spondylosis without myelopathy or radiculopathy, thoracic region: Secondary | ICD-10-CM | POA: Insufficient documentation

## 2013-11-12 DIAGNOSIS — C3490 Malignant neoplasm of unspecified part of unspecified bronchus or lung: Secondary | ICD-10-CM

## 2013-11-12 DIAGNOSIS — Y842 Radiological procedure and radiotherapy as the cause of abnormal reaction of the patient, or of later complication, without mention of misadventure at the time of the procedure: Secondary | ICD-10-CM | POA: Insufficient documentation

## 2013-11-12 DIAGNOSIS — J479 Bronchiectasis, uncomplicated: Secondary | ICD-10-CM | POA: Insufficient documentation

## 2013-11-12 DIAGNOSIS — I700 Atherosclerosis of aorta: Secondary | ICD-10-CM | POA: Insufficient documentation

## 2013-11-12 DIAGNOSIS — I2510 Atherosclerotic heart disease of native coronary artery without angina pectoris: Secondary | ICD-10-CM | POA: Insufficient documentation

## 2013-11-12 DIAGNOSIS — J701 Chronic and other pulmonary manifestations due to radiation: Secondary | ICD-10-CM | POA: Insufficient documentation

## 2013-11-12 DIAGNOSIS — C3430 Malignant neoplasm of lower lobe, unspecified bronchus or lung: Secondary | ICD-10-CM

## 2013-11-12 LAB — COMPREHENSIVE METABOLIC PANEL (CC13)
Albumin: 3 g/dL — ABNORMAL LOW (ref 3.5–5.0)
Alkaline Phosphatase: 60 U/L (ref 40–150)
BUN: 18.6 mg/dL (ref 7.0–26.0)
Calcium: 10.1 mg/dL (ref 8.4–10.4)
Creatinine: 1.5 mg/dL — ABNORMAL HIGH (ref 0.6–1.1)
Glucose: 101 mg/dl (ref 70–140)
Potassium: 3.8 mEq/L (ref 3.5–5.1)

## 2013-11-12 LAB — CBC WITH DIFFERENTIAL/PLATELET
Basophils Absolute: 0 10*3/uL (ref 0.0–0.1)
Eosinophils Absolute: 0.3 10*3/uL (ref 0.0–0.5)
HCT: 35.9 % (ref 34.8–46.6)
HGB: 11.6 g/dL (ref 11.6–15.9)
LYMPH%: 12.7 % — ABNORMAL LOW (ref 14.0–49.7)
MCV: 95.1 fL (ref 79.5–101.0)
MONO%: 9.2 % (ref 0.0–14.0)
NEUT#: 4.1 10*3/uL (ref 1.5–6.5)
NEUT%: 71.8 % (ref 38.4–76.8)
Platelets: 190 10*3/uL (ref 145–400)
RDW: 14.9 % — ABNORMAL HIGH (ref 11.2–14.5)

## 2013-11-12 MED ORDER — IOHEXOL 300 MG/ML  SOLN
50.0000 mL | Freq: Once | INTRAMUSCULAR | Status: AC | PRN
  Administered 2013-11-12: 50 mL via ORAL

## 2013-11-12 NOTE — Progress Notes (Signed)
Faxed notification from [facility] that [place] was shipped on 11/11/13 for next day delivery.

## 2013-11-13 ENCOUNTER — Ambulatory Visit (HOSPITAL_BASED_OUTPATIENT_CLINIC_OR_DEPARTMENT_OTHER): Payer: Medicare Other | Admitting: Physician Assistant

## 2013-11-13 ENCOUNTER — Telehealth: Payer: Self-pay | Admitting: Internal Medicine

## 2013-11-13 ENCOUNTER — Encounter: Payer: Self-pay | Admitting: Physician Assistant

## 2013-11-13 VITALS — BP 110/54 | HR 89 | Temp 97.8°F | Resp 18 | Ht 66.0 in | Wt 236.4 lb

## 2013-11-13 DIAGNOSIS — R21 Rash and other nonspecific skin eruption: Secondary | ICD-10-CM

## 2013-11-13 DIAGNOSIS — C3490 Malignant neoplasm of unspecified part of unspecified bronchus or lung: Secondary | ICD-10-CM

## 2013-11-13 MED ORDER — HEPARIN SOD (PORK) LOCK FLUSH 100 UNIT/ML IV SOLN
250.0000 [IU] | INTRAVENOUS | Status: AC | PRN
  Filled 2013-11-13: qty 5

## 2013-11-13 MED ORDER — SODIUM CHLORIDE 0.9 % IJ SOLN
10.0000 mL | INTRAMUSCULAR | Status: AC | PRN
  Filled 2013-11-13: qty 10

## 2013-11-13 MED ORDER — SODIUM CHLORIDE 0.9 % IJ SOLN
10.0000 mL | INTRAMUSCULAR | Status: AC | PRN
  Administered 2013-11-13: 10 mL
  Filled 2013-11-13: qty 10

## 2013-11-13 MED ORDER — HEPARIN SOD (PORK) LOCK FLUSH 100 UNIT/ML IV SOLN
500.0000 [IU] | INTRAVENOUS | Status: AC | PRN
  Administered 2013-11-13: 500 [IU]
  Filled 2013-11-13: qty 5

## 2013-11-13 NOTE — Patient Instructions (Addendum)

## 2013-11-13 NOTE — Telephone Encounter (Signed)
Gave pt appt for lab and MD for January 2015 °

## 2013-11-13 NOTE — Progress Notes (Signed)
[place] [phone]   [phone]  SHARED VISIT PROGRESS NOTE  [name], MD [place] [address]  DIAGNOSIS: Recurrent non-small cell lung cancer, adenocarcinoma initially diagnosed as stage IIA in March 2008. The tumor cells were negative for EGFR mutation as well as ALK gene translocation.   PRIOR THERAPY:  1. Status post concurrent chemoradiation with weekly carboplatin and paclitaxel. Last dose was given Apr 12, 2007. 2. Status post 3 cycles of consolidation chemotherapy with docetaxel. Last dose was given July 12, 2007. 3. 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 4. Status post 39 cycles of maintenance Alimta at a dose of 500 mg/m2. Last dose was given February 25, 2010, discontinued secondary to persistent anemia, but the patient has stable disease.  CURRENT THERAPY: Tarceva 150 mg by mouth daily. Status post approximately15 months of therapy.   CHEMOTHERAPY INTENT: Palliative  CURRENT # OF CHEMOTHERAPY CYCLES: 15 CURRENT ANTIEMETICS: Compazine when necessary  CURRENT SMOKING STATUS: Former smoker quit 11/27/2001  ORAL CHEMOTHERAPY AND CONSENT: Tarceva  CURRENT BISPHOSPHONATES USE: None  PAIN MANAGEMENT: 0/10  NARCOTICS INDUCED CONSTIPATION: None  LIVING WILL AND CODE STATUS: Full code initially but no prolonged resuscitation.    INTERVAL HISTORY: Diane [name] 72 y.o. female returns to the clinic today for followup visit. The patient denied having any significant diarrhea. She has very mild skin rash on the face. She denied having any significant chest pain but continues to have shortness of breath with exertion with no cough or hemoptysis. She denied having any significant weight loss or night sweats. She had a restaging CT scan of her chest abdomen and pelvis and presents to discuss the results of those studies.   MEDICAL  HISTORY: Past Medical History  Diagnosis Date  . Hypertension   . Renal insufficiency     stage 3 renal disease/ pt  . lung ca dx'd 02/2010  . 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  MEDICATIONS:  Current Outpatient Prescriptions  Medication Sig Dispense Refill  . aspirin 81 MG tablet Take 81 mg by mouth daily.        . calcitRIOL (ROCALTROL) 0.25 MCG capsule Take 0.25 mcg by mouth every Monday, Wednesday, and Friday.      . clindamycin (CLEOCIN T) 1 % external solution APPLY TOPICALLY TWICE DAILY  60 mL  2  . erlotinib (TARCEVA) 150 MG tablet Take 1 tablet (150 mg total) by mouth daily. Take on an empty stomach 1 hour before meals or 2 hours after.  30 tablet  0  . labetalol (NORMODYNE) 200 MG tablet Take 200 mg by mouth 2 (two) times daily.        . polyethylene glycol (MIRALAX / GLYCOLAX) packet Take 17 g by mouth daily.      Marland Kitchen torsemide (DEMADEX) 20 MG tablet Take 20 mg by mouth daily.        Marland Kitchen acetaminophen (TYLENOL) 325 MG tablet Take 650 mg by mouth every 6 (six) hours as needed.        . clindamycin (CLEOCIN-T) 1 % external solution Apply topically 2 (two) times daily. Apply as directed  60 mL  1  . ondansetron (ZOFRAN) 8 MG tablet Take 1 tablet (8 mg total) by mouth every 8 (eight) hours as needed for nausea.  20 tablet  2   No current facility-administered  medications for this visit.   Facility-Administered Medications Ordered in Other Visits  Medication Dose Route Frequency Provider Last Rate Last Dose  . sodium chloride 0.9 % injection 10 mL  10 mL Intravenous PRN Curt Bears, MD   10 mL at 10/14/13 1030    SURGICAL HISTORY:  Past Surgical History  Procedure Laterality Date  . Portacath placement  11/03/08-[facility]    tip in mid SVC-E. Mansell    REVIEW OF SYSTEMS:  Constitutional: negative Eyes: negative Ears, nose, mouth, throat, and face: negative Respiratory: positive for dyspnea on exertion Cardiovascular:  negative Gastrointestinal: negative Genitourinary:negative Integument/breast: positive for dryness and rash Hematologic/lymphatic: negative Musculoskeletal:negative Neurological: negative Behavioral/Psych: negative Endocrine: negative Allergic/Immunologic: negative   PHYSICAL EXAMINATION: General appearance: alert, cooperative and no distress Head: Normocephalic, without obvious abnormality, atraumatic Neck: no adenopathy, no carotid bruit, no JVD, supple, symmetrical, trachea midline and thyroid not enlarged, symmetric, no tenderness/mass/nodules Lymph nodes: Cervical, supraclavicular, and axillary nodes normal. Resp: clear to auscultation bilaterally and normal percussion bilaterally Back: symmetric, no curvature. ROM normal. No CVA tenderness. Cardio: regular rate and rhythm, S1, S2 normal, no murmur, click, rub or gallop GI: soft, non-tender; bowel sounds normal; no masses,  no organomegaly Extremities: extremities normal, atraumatic, no cyanosis or edema Neurologic: Alert and oriented X 3, normal strength and tone. Normal symmetric reflexes. Normal coordination and gait  ECOG PERFORMANCE STATUS: 1 - Symptomatic but completely ambulatory  Blood pressure 110/54, pulse 89, temperature 97.8 F (36.6 C), temperature source Oral, resp. rate 18, height 5\' 6"  (1.676 m), weight 236 lb 6.4 oz (107.23 kg).  LABORATORY DATA: Lab Results  Component Value Date   WBC 5.8 November 16, 2013   HGB 11.6 2013/11/16   HCT 35.9 16-Nov-2013   MCV 95.1 2013/11/16   PLT 190 2013/11/16      Chemistry      Component Value Date/Time   NA 142 11/16/2013 0752   NA 137 11/14/2012 0816   NA 143 07/11/2012 0927   K 3.8 11/16/2013 0752   K 3.9 11/14/2012 0816   K 4.5 07/11/2012 0927   CL 102 05/08/2013 0851   CL 101 11/14/2012 0816   CL 101 07/11/2012 0927   CO2 27 2013-11-16 0752   CO2 28 11/14/2012 0816   CO2 29 07/11/2012 0927   BUN 18.6 November 16, 2013 0752   BUN 17 11/14/2012 0816   BUN 16 07/11/2012 0927    CREATININE 1.5* 16-Nov-2013 0752   CREATININE 1.52* 11/14/2012 0816   CREATININE 1.5* 07/11/2012 0927      Component Value Date/Time   CALCIUM 10.1 November 16, 2013 0752   CALCIUM 10.2 11/14/2012 0816   CALCIUM 10.0 07/11/2012 0927   ALKPHOS 60 11-16-2013 0752   ALKPHOS 74 11/14/2012 0816   ALKPHOS 71 07/11/2012 0927   AST 17 11-16-13 0752   AST 16 11/14/2012 0816   AST 19 07/11/2012 0927   ALT 13 11/16/2013 0752   ALT 8 11/14/2012 0816   ALT 18 07/11/2012 0927   BILITOT 1.20 Nov 16, 2013 0752   BILITOT 1.2 11/14/2012 0816   BILITOT 0.80 07/11/2012 0927       RADIOGRAPHIC STUDIES: Ct Abdomen Wo Contrast  16-Nov-2013   CLINICAL DATA:  Malignant neoplasm of the bronchus  EXAM: CT CHEST, ABDOMEN AND PELVIS WITHOUT CONTRAST  TECHNIQUE: Multidetector CT imaging of the chest, abdomen and pelvis was performed following the standard protocol without IV contrast.  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  Radiation change is identified within the right lung. Perihilar consolidation, fibrosis and bronchiectasis appears similar to previous exam. Similar appearance of scattered tiny nodular densities. 2 mm left lower lobe nodule is unchanged, image 42/series 4. Stable 3 mm ground-glass nodule in left lower lobe, image 30/ series 4. Patchy subpleural nodules in the right lung are stable from previous study. Index right middle lobe subpleural nodule is unchanged measuring 9 mm, image 22/series 4. Index left lower lobe nodule measures 8 mm, image 22/series 4.  The trachea appears patent and is midline. Calcified atherosclerotic disease affects the thoracic aorta. Calcified atherosclerotic disease affects the LAD, left circumflex and RCA Coronary artery.  No axillary or supraclavicular adenopathy. Review of the visualized osseous  structures is significant for mild thoracic spondylosis. No aggressive lytic or sclerotic bone lesions noted.  CT ABDOMEN  FINDINGS  There is no focal liver abnormality. The gallbladder appears within normal limits. The pancreas appears normal. The spleen is unremarkable. Normal appearance of the adrenal glands. Tiny stone within the right kidney measures 3 mm, image 69/series 2. The left kidney appears normal.  Calcified atherosclerotic disease affects the abdominal aorta. There is no aneurysm. No upper abdominal adenopathy identified. There is no free fluid or fluid collections identified. The stomach appears normal. The small bowel loops stress set the visualized small and large bowel loops appear within normal limits.  Review of the visualized osseous structures is significant for mild spondylosis. There are no aggressive lytic or sclerotic bone lesions identified.  IMPRESSION: CT chest:  1. No acute findings. 2. No specific features identified to suggest intra thoracic recurrence of tumor. CT abdomen:  1. No acute findings. 2. No specific features identified to suggest metastatic disease.   Electronically Signed   By: Kerby Moors M.D.   On: 11/12/2013 11:24   Ct Chest Wo Contrast  11/12/2013   CLINICAL DATA:  Malignant neoplasm of the bronchus  EXAM: CT CHEST, ABDOMEN AND PELVIS WITHOUT CONTRAST  TECHNIQUE: Multidetector CT imaging of the chest, abdomen and pelvis was performed following the standard protocol without IV contrast.  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  Radiation change is identified within the right lung. Perihilar consolidation, fibrosis and bronchiectasis appears similar to previous exam. Similar appearance of scattered tiny nodular densities. 2 mm left lower lobe nodule is unchanged, image  42/series 4. Stable 3 mm ground-glass nodule in left lower lobe, image 30/ series 4. Patchy subpleural nodules in the right lung are stable from previous study. Index right middle lobe subpleural nodule is unchanged measuring 9 mm, image 22/series 4. Index left lower lobe nodule measures 8 mm, image 22/series 4.  The trachea appears patent and is midline. Calcified atherosclerotic disease affects the thoracic aorta. Calcified atherosclerotic disease affects the LAD, left circumflex and RCA Coronary artery.  No axillary or supraclavicular adenopathy. Review of the visualized osseous structures is significant for mild thoracic spondylosis. No aggressive lytic or sclerotic bone lesions noted.  CT ABDOMEN  FINDINGS  There is no focal liver abnormality. The gallbladder appears within normal limits. The pancreas appears normal. The spleen is unremarkable. Normal appearance of the adrenal glands. Tiny stone within the right kidney measures 3 mm, image 69/series 2. The left kidney appears normal.  Calcified  atherosclerotic disease affects the abdominal aorta. There is no aneurysm. No upper abdominal adenopathy identified. There is no free fluid or fluid collections identified. The stomach appears normal. The small bowel loops stress set the visualized small and large bowel loops appear within normal limits.  Review of the visualized osseous structures is significant for mild spondylosis. There are no aggressive lytic or sclerotic bone lesions identified.  IMPRESSION: CT chest:  1. No acute findings. 2. No specific features identified to suggest intra thoracic recurrence of tumor. CT abdomen:  1. No acute findings. 2. No specific features identified to suggest metastatic disease.   Electronically Signed   By: Kerby Moors M.D.   On: 11/12/2013 11:24    ASSESSMENT AND PLAN: This is a very pleasant 72 years old white female with metastatic non-small cell lung cancer currently on treatment with Tarceva 150 mg by mouth  daily status post 15 months of treatment and tolerating it fairly well. Patient was discussed with an also seen by Dr. [name]. Her CT scan reveals stable disease with no evidence for disease recurrence or metastasis. The patient will continue her current treatment with Tarceva 150 mg by mouth daily. Her Port-A-Cath was flushed today and will be due for another flushing in approximately 8 weeks. Patient will return in one month for another symptom management visit with a repeat CBC differential and C. met.  She was advised to call immediately if she has any concerning symptoms in the interval.  The patient voices understanding of current disease status and treatment options and is in agreement with the current care plan.  All questions were answered. The patient knows to call the clinic with any problems, questions or concerns. We can certainly see the patient much sooner if necessary.  Carlton Adam PA-C  ADDENDUM: Hematology/Oncology Attending: I had the face to face encounter with the patient. I recommended for her care plan. She is a very pleasant 72 years old white female with recurrent non-small cell lung cancer currently undergoing treatment with oral Tarceva status post 15 months of treatment. The patient is tolerating her treatment fairly well with no significant adverse effects. She denied having any significant diarrhea but has mild skin rash on the face. Her recent CT scan of the chest, abdomen and pelvis showed no evidence for disease progression. I discussed the scan results with the patient today. I recommended for her to continue her current treatment with Tarceva 150 mg by mouth daily. She would come back for follow up visit in one month's for reevaluation with repeat CBC and comprehensive metabolic panel.  The patient would have Port-A-Cath flushed today. She was advised to call immediately if she has any concerning symptoms in the interval. Diane [name].,  MD 11/17/2013

## 2013-12-15 ENCOUNTER — Other Ambulatory Visit (HOSPITAL_BASED_OUTPATIENT_CLINIC_OR_DEPARTMENT_OTHER): Payer: Medicare Other

## 2013-12-15 ENCOUNTER — Encounter: Payer: Self-pay | Admitting: Internal Medicine

## 2013-12-15 ENCOUNTER — Encounter (INDEPENDENT_AMBULATORY_CARE_PROVIDER_SITE_OTHER): Payer: Self-pay

## 2013-12-15 ENCOUNTER — Ambulatory Visit (HOSPITAL_BASED_OUTPATIENT_CLINIC_OR_DEPARTMENT_OTHER): Payer: Medicare Other | Admitting: Internal Medicine

## 2013-12-15 VITALS — BP 97/63 | HR 93 | Temp 96.8°F | Resp 17 | Ht 66.0 in | Wt 230.0 lb

## 2013-12-15 DIAGNOSIS — J209 Acute bronchitis, unspecified: Secondary | ICD-10-CM

## 2013-12-15 DIAGNOSIS — C3430 Malignant neoplasm of lower lobe, unspecified bronchus or lung: Secondary | ICD-10-CM

## 2013-12-15 DIAGNOSIS — C3490 Malignant neoplasm of unspecified part of unspecified bronchus or lung: Secondary | ICD-10-CM

## 2013-12-15 LAB — COMPREHENSIVE METABOLIC PANEL (CC13)
ALBUMIN: 2.7 g/dL — AB (ref 3.5–5.0)
ALT: 12 U/L (ref 0–55)
ANION GAP: 10 meq/L (ref 3–11)
AST: 14 U/L (ref 5–34)
Alkaline Phosphatase: 67 U/L (ref 40–150)
BUN: 16.4 mg/dL (ref 7.0–26.0)
CO2: 29 meq/L (ref 22–29)
Calcium: 10.3 mg/dL (ref 8.4–10.4)
Chloride: 100 mEq/L (ref 98–109)
Creatinine: 1.6 mg/dL — ABNORMAL HIGH (ref 0.6–1.1)
GLUCOSE: 134 mg/dL (ref 70–140)
POTASSIUM: 3.7 meq/L (ref 3.5–5.1)
SODIUM: 138 meq/L (ref 136–145)
TOTAL PROTEIN: 6.2 g/dL — AB (ref 6.4–8.3)
Total Bilirubin: 1.79 mg/dL — ABNORMAL HIGH (ref 0.20–1.20)

## 2013-12-15 LAB — CBC WITH DIFFERENTIAL/PLATELET
BASO%: 0.3 % (ref 0.0–2.0)
Basophils Absolute: 0 10*3/uL (ref 0.0–0.1)
EOS%: 2.8 % (ref 0.0–7.0)
Eosinophils Absolute: 0.2 10*3/uL (ref 0.0–0.5)
HEMATOCRIT: 33.3 % — AB (ref 34.8–46.6)
HEMOGLOBIN: 11.3 g/dL — AB (ref 11.6–15.9)
LYMPH#: 0.5 10*3/uL — AB (ref 0.9–3.3)
LYMPH%: 5.6 % — ABNORMAL LOW (ref 14.0–49.7)
MCH: 32.3 pg (ref 25.1–34.0)
MCHC: 33.9 g/dL (ref 31.5–36.0)
MCV: 95.1 fL (ref 79.5–101.0)
MONO#: 0.8 10*3/uL (ref 0.1–0.9)
MONO%: 8.8 % (ref 0.0–14.0)
NEUT#: 7.2 10*3/uL — ABNORMAL HIGH (ref 1.5–6.5)
NEUT%: 82.5 % — ABNORMAL HIGH (ref 38.4–76.8)
Platelets: 220 10*3/uL (ref 145–400)
RBC: 3.5 10*6/uL — ABNORMAL LOW (ref 3.70–5.45)
RDW: 13.4 % (ref 11.2–14.5)
WBC: 8.8 10*3/uL (ref 3.9–10.3)

## 2013-12-15 MED ORDER — AZITHROMYCIN 250 MG PO TABS: ORAL_TABLET | ORAL | Status: DC

## 2013-12-15 NOTE — Progress Notes (Signed)
[facility] [phone]   Fax:([phone]  OFFICE PROGRESS NOTE  [name], MD [address] [place] [address]  DIAGNOSIS: Recurrent non-small cell lung cancer, adenocarcinoma initially diagnosed as stage IIA in March 2008. The tumor cells were negative for EGFR mutation as well as ALK gene translocation.   PRIOR THERAPY:  1. Status post concurrent chemoradiation with weekly carboplatin and paclitaxel. Last dose was given Apr 12, 2007. 2. Status post 3 cycles of consolidation chemotherapy with docetaxel. Last dose was given July 12, 2007. 3. 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 4. Status post 39 cycles of maintenance Alimta at a dose of 500 mg/m2. Last dose was given February 25, 2010, discontinued secondary to persistent anemia, but the patient has stable disease.  CURRENT THERAPY: Tarceva 150 mg by mouth daily. Status post approximately16 months of therapy.   CHEMOTHERAPY INTENT: Palliative  CURRENT # OF CHEMOTHERAPY CYCLES: 17  CURRENT ANTIEMETICS: Compazine when necessary  CURRENT SMOKING STATUS: Former smoker quit 11/27/2001  ORAL CHEMOTHERAPY AND CONSENT: Tarceva  CURRENT BISPHOSPHONATES USE: None  PAIN MANAGEMENT: 0/10  NARCOTICS INDUCED CONSTIPATION: None  LIVING WILL AND CODE STATUS: Full code initially but no prolonged resuscitation.    INTERVAL HISTORY: Diane [name] 73 y.o. female returns to the clinic today for followup visit. The patient is feeling fine today except for her chest congestion and sinus drainage. She also has cough productive of yellowish sputum. She denied having any significant skin rash but has few episodes of diarrhea. She has no significant fever or chills. She has no chest pain but continues to have shortness breath with exertion with no hemoptysis. She denied having any significant weight loss or night sweats.  MEDICAL  HISTORY: Past Medical History  Diagnosis Date  . Hypertension   . Renal insufficiency     stage 3 renal disease/ pt  . lung ca dx'd 02/2010  . 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  MEDICATIONS:  Current Outpatient Prescriptions  Medication Sig Dispense Refill  . aspirin 81 MG tablet Take 81 mg by mouth daily.        . calcitRIOL (ROCALTROL) 0.25 MCG capsule Take 0.25 mcg by mouth every Monday, Wednesday, and Friday.      . clindamycin (CLEOCIN-T) 1 % external solution Apply topically 2 (two) times daily. Apply as directed  60 mL  1  . erlotinib (TARCEVA) 150 MG tablet Take 1 tablet (150 mg total) by mouth daily. Take on an empty stomach 1 hour before meals or 2 hours after.  30 tablet  0  . labetalol (NORMODYNE) 200 MG tablet Take 200 mg by mouth 2 (two) times daily.        . polyethylene glycol (MIRALAX / GLYCOLAX) packet Take 17 g by mouth daily.      Marland Kitchen torsemide (DEMADEX) 20 MG tablet Take 20 mg by mouth daily.        Marland Kitchen acetaminophen (TYLENOL) 325 MG tablet Take 650 mg by mouth every 6 (six) hours as needed.        . ondansetron (ZOFRAN) 8 MG tablet Take 1 tablet (8 mg total) by mouth every 8 (eight) hours as needed for nausea.  20 tablet  2   No current facility-administered medications for this visit.   Facility-Administered Medications Ordered in Other Visits  Medication Dose Route Frequency Provider Last Rate  Last Dose  . sodium chloride 0.9 % injection 10 mL  10 mL Intravenous PRN Curt Bears, MD   10 mL at 10/14/13 1030    SURGICAL HISTORY:  Past Surgical History  Procedure Laterality Date  . Portacath placement  11/03/08-[facility]    tip in mid SVC-E. Mansell    REVIEW OF SYSTEMS:  Constitutional: positive for fatigue Eyes: negative Ears, nose, mouth, throat, and face: negative Respiratory: positive for dyspnea on exertion Cardiovascular: negative Gastrointestinal:  negative Genitourinary:negative Integument/breast: positive for dryness Hematologic/lymphatic: negative Musculoskeletal:negative Neurological: negative Behavioral/Psych: negative Endocrine: negative Allergic/Immunologic: negative   PHYSICAL EXAMINATION: General appearance: alert, cooperative and no distress Head: Normocephalic, without obvious abnormality, atraumatic Neck: no adenopathy, no carotid bruit, no JVD, supple, symmetrical, trachea midline and thyroid not enlarged, symmetric, no tenderness/mass/nodules Lymph nodes: Cervical, supraclavicular, and axillary nodes normal. Resp: clear to auscultation bilaterally and normal percussion bilaterally Back: symmetric, no curvature. ROM normal. No CVA tenderness. Cardio: regular rate and rhythm, S1, S2 normal, no murmur, click, rub or gallop GI: soft, non-tender; bowel sounds normal; no masses,  no organomegaly Extremities: extremities normal, atraumatic, no cyanosis or edema Neurologic: Alert and oriented X 3, normal strength and tone. Normal symmetric reflexes. Normal coordination and gait  ECOG PERFORMANCE STATUS: 1 - Symptomatic but completely ambulatory  Blood pressure 97/63, pulse 93, temperature 96.8 F (36 C), temperature source Oral, resp. rate 17, height 5' 6" (1.676 m), weight 230 lb (104.327 kg).  LABORATORY DATA: Lab Results  Component Value Date   WBC 8.8 12/15/2013   HGB 11.3* 12/15/2013   HCT 33.3* 12/15/2013   MCV 95.1 12/15/2013   PLT 220 12/15/2013      Chemistry      Component Value Date/Time   NA 142 11/12/2013 0752   NA 137 11/14/2012 0816   NA 143 07/11/2012 0927   K 3.8 11/12/2013 0752   K 3.9 11/14/2012 0816   K 4.5 07/11/2012 0927   CL 102 05/08/2013 0851   CL 101 11/14/2012 0816   CL 101 07/11/2012 0927   CO2 27 11/12/2013 0752   CO2 28 11/14/2012 0816   CO2 29 07/11/2012 0927   BUN 18.6 11/12/2013 0752   BUN 17 11/14/2012 0816   BUN 16 07/11/2012 0927   CREATININE 1.5* 11/12/2013 0752   CREATININE  1.52* 11/14/2012 0816   CREATININE 1.5* 07/11/2012 0927      Component Value Date/Time   CALCIUM 10.1 11/12/2013 0752   CALCIUM 10.2 11/14/2012 0816   CALCIUM 10.0 07/11/2012 0927   ALKPHOS 60 11/12/2013 0752   ALKPHOS 74 11/14/2012 0816   ALKPHOS 71 07/11/2012 0927   AST 17 11/12/2013 0752   AST 16 11/14/2012 0816   AST 19 07/11/2012 0927   ALT 13 11/12/2013 0752   ALT 8 11/14/2012 0816   ALT 18 07/11/2012 0927   BILITOT 1.20 11/12/2013 0752   BILITOT 1.2 11/14/2012 0816   BILITOT 0.80 07/11/2012 0927       RADIOGRAPHIC STUDIES:  ASSESSMENT AND PLAN: This is a very pleasant 73 years old white female with metastatic non-small cell lung cancer currently on treatment with Tarceva 150 mg by mouth daily status post 16 months of treatment and tolerating it fairly well except for dry skin and few episodes of diarrhea resolved with Imodium. For the acute bronchitis and sinus drainage, I will start the patient on Z-Pak. I recommended for the patient to continue her current treatment with Tarceva 150 mg by mouth daily. She would come back  for followup visit in one month for reevaluation with repeat CBC and comprehensive metabolic panel. She was advised to call immediately if she has any concerning symptoms in the interval.  The patient voices understanding of current disease status and treatment options and is in agreement with the current care plan.  All questions were answered. The patient knows to call the clinic with any problems, questions or concerns. We can certainly see the patient much sooner if necessary.  Disclaimer: This note was dictated with voice recognition software. Similar sounding words can inadvertently be transcribed and may not be corrected upon review.       

## 2013-12-15 NOTE — Patient Instructions (Signed)
Followup visit in one month with repeat blood work.

## 2013-12-16 ENCOUNTER — Telehealth: Payer: Self-pay | Admitting: Internal Medicine

## 2013-12-16 ENCOUNTER — Encounter: Payer: Self-pay | Admitting: *Deleted

## 2013-12-16 NOTE — Telephone Encounter (Signed)
returned pt call and s.w pt advised that i added on flush appt after lab...pt ok and awre

## 2013-12-16 NOTE — Telephone Encounter (Signed)
s.w. pt and advised on Feb appt...pt ok and aware

## 2013-12-16 NOTE — Progress Notes (Signed)
RECEIVED A FAX FROM [facility] CONCERNING A CONFIRMATION OF PRESCRIPTION SHIPMENT FOR [facility] ON 12/15/13.

## 2014-01-12 ENCOUNTER — Other Ambulatory Visit: Payer: Self-pay | Admitting: *Deleted

## 2014-01-12 DIAGNOSIS — C3490 Malignant neoplasm of unspecified part of unspecified bronchus or lung: Secondary | ICD-10-CM

## 2014-01-12 NOTE — Telephone Encounter (Signed)
THIS REFILL REQUEST FOR TARCEVA WAS GIVEN TO DR.MOHAMED'S NURSE, DIANE BELL,RN.

## 2014-01-13 MED ORDER — ERLOTINIB HCL 150 MG PO TABS: 150.0000 mg | ORAL_TABLET | Freq: Every day | ORAL | Status: DC

## 2014-01-13 NOTE — Addendum Note (Signed)
Addended by: Wyonia Hough on: 01/13/2014 12:29 PM   Modules accepted: Orders

## 2014-01-15 ENCOUNTER — Encounter: Payer: Self-pay | Admitting: Physician Assistant

## 2014-01-15 ENCOUNTER — Encounter (INDEPENDENT_AMBULATORY_CARE_PROVIDER_SITE_OTHER): Payer: Self-pay

## 2014-01-15 ENCOUNTER — Ambulatory Visit (HOSPITAL_BASED_OUTPATIENT_CLINIC_OR_DEPARTMENT_OTHER): Payer: Medicare Other | Admitting: Physician Assistant

## 2014-01-15 ENCOUNTER — Telehealth: Payer: Self-pay | Admitting: Internal Medicine

## 2014-01-15 ENCOUNTER — Other Ambulatory Visit (HOSPITAL_BASED_OUTPATIENT_CLINIC_OR_DEPARTMENT_OTHER): Payer: Medicare Other

## 2014-01-15 ENCOUNTER — Ambulatory Visit: Payer: Medicare Other | Admitting: Internal Medicine

## 2014-01-15 ENCOUNTER — Ambulatory Visit (HOSPITAL_BASED_OUTPATIENT_CLINIC_OR_DEPARTMENT_OTHER): Payer: Medicare Other

## 2014-01-15 VITALS — BP 102/54 | HR 80 | Temp 97.9°F | Resp 18 | Ht 66.0 in | Wt 231.8 lb

## 2014-01-15 DIAGNOSIS — C3430 Malignant neoplasm of lower lobe, unspecified bronchus or lung: Secondary | ICD-10-CM

## 2014-01-15 DIAGNOSIS — Z95828 Presence of other vascular implants and grafts: Secondary | ICD-10-CM

## 2014-01-15 DIAGNOSIS — C3490 Malignant neoplasm of unspecified part of unspecified bronchus or lung: Secondary | ICD-10-CM

## 2014-01-15 LAB — CBC WITH DIFFERENTIAL/PLATELET
BASO%: 0.9 % (ref 0.0–2.0)
Basophils Absolute: 0 10*3/uL (ref 0.0–0.1)
EOS%: 7.1 % — AB (ref 0.0–7.0)
Eosinophils Absolute: 0.3 10*3/uL (ref 0.0–0.5)
HEMATOCRIT: 33.6 % — AB (ref 34.8–46.6)
HGB: 11.1 g/dL — ABNORMAL LOW (ref 11.6–15.9)
LYMPH#: 0.5 10*3/uL — AB (ref 0.9–3.3)
LYMPH%: 11 % — ABNORMAL LOW (ref 14.0–49.7)
MCH: 31.4 pg (ref 25.1–34.0)
MCHC: 33 g/dL (ref 31.5–36.0)
MCV: 95.2 fL (ref 79.5–101.0)
MONO#: 0.4 10*3/uL (ref 0.1–0.9)
MONO%: 7.8 % (ref 0.0–14.0)
NEUT#: 3.4 10*3/uL (ref 1.5–6.5)
NEUT%: 73.2 % (ref 38.4–76.8)
Platelets: 174 10*3/uL (ref 145–400)
RBC: 3.53 10*6/uL — AB (ref 3.70–5.45)
RDW: 14.8 % — ABNORMAL HIGH (ref 11.2–14.5)
WBC: 4.6 10*3/uL (ref 3.9–10.3)

## 2014-01-15 LAB — COMPREHENSIVE METABOLIC PANEL (CC13)
ALT: 11 U/L (ref 0–55)
AST: 15 U/L (ref 5–34)
Albumin: 2.8 g/dL — ABNORMAL LOW (ref 3.5–5.0)
Alkaline Phosphatase: 60 U/L (ref 40–150)
Anion Gap: 7 mEq/L (ref 3–11)
BUN: 22.4 mg/dL (ref 7.0–26.0)
CALCIUM: 9.8 mg/dL (ref 8.4–10.4)
CO2: 26 meq/L (ref 22–29)
CREATININE: 1.7 mg/dL — AB (ref 0.6–1.1)
Chloride: 107 mEq/L (ref 98–109)
Glucose: 102 mg/dl (ref 70–140)
Potassium: 3.8 mEq/L (ref 3.5–5.1)
SODIUM: 141 meq/L (ref 136–145)
TOTAL PROTEIN: 5.6 g/dL — AB (ref 6.4–8.3)
Total Bilirubin: 1.22 mg/dL — ABNORMAL HIGH (ref 0.20–1.20)

## 2014-01-15 MED ORDER — HEPARIN SOD (PORK) LOCK FLUSH 100 UNIT/ML IV SOLN
500.0000 [IU] | Freq: Once | INTRAVENOUS | Status: AC
  Administered 2014-01-15: 500 [IU] via INTRAVENOUS
  Filled 2014-01-15: qty 5

## 2014-01-15 MED ORDER — SODIUM CHLORIDE 0.9 % IJ SOLN
10.0000 mL | INTRAMUSCULAR | Status: DC | PRN
  Administered 2014-01-15: 10 mL via INTRAVENOUS
  Filled 2014-01-15: qty 10

## 2014-01-15 NOTE — Telephone Encounter (Signed)
RECEIVED A FAX FROM [facility] CONCERNING A CONFIRMATION OF PRESCRIPTION SHIPMENT FOR [facility] ON 01/14/14.

## 2014-01-15 NOTE — Patient Instructions (Signed)
Continue taking Tarceva 150 mg by mouth daily Followup in one month with a restaging CT scan of your chest, abdomen and pelvis to reevaluate your disease

## 2014-01-15 NOTE — Patient Instructions (Signed)

## 2014-01-15 NOTE — Progress Notes (Addendum)
[name] [phone]   Fax:(336) [phone]  SHARED VISIT PROGRESS NOTE  Diane Downing, MD [place] [address]  DIAGNOSIS: Recurrent non-small cell lung cancer, adenocarcinoma initially diagnosed as stage IIA in March 2008. The tumor cells were negative for EGFR mutation as well as ALK gene translocation.   PRIOR THERAPY:  1. Status post concurrent chemoradiation with weekly carboplatin and paclitaxel. Last dose was given Apr 12, 2007. 2. Status post 3 cycles of consolidation chemotherapy with docetaxel. Last dose was given July 12, 2007. 3. 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 4. Status post 39 cycles of maintenance Alimta at a dose of 500 mg/m2. Last dose was given February 25, 2010, discontinued secondary to persistent anemia, but the patient has stable disease.  CURRENT THERAPY: Tarceva 150 mg by mouth daily. Status post approximately 17 months of therapy.   CHEMOTHERAPY INTENT: Palliative  CURRENT # OF CHEMOTHERAPY CYCLES: 18  CURRENT ANTIEMETICS: Compazine when necessary  CURRENT SMOKING STATUS: Former smoker quit 11/27/2001  ORAL CHEMOTHERAPY AND CONSENT: Tarceva  CURRENT BISPHOSPHONATES USE: None  PAIN MANAGEMENT: 0/10  NARCOTICS INDUCED CONSTIPATION: None  LIVING WILL AND CODE STATUS: Full code initially but no prolonged resuscitation.    INTERVAL HISTORY: Diane [name] 73 y.o. female returns to the clinic today for followup visit. The patient is feeling fine today. She reports that she is being treated for infected toe with a course of Septra by her primary care physician. She is concerned about some perceived increased swelling in the left supraclavicular area. She continues to tolerate her Tarceva without significant difficulty. She recently had a skin lesion on her face "burned off" by her dermatologist. The scalp lesions related to the  Tarceva are now being well managed with a combination of warm compresses and Vaseline.  She denied having any significant skin rash but has few episodes of diarrhea. She has no significant fever or chills. She has no chest pain but continues to have shortness breath with exertion with no hemoptysis. She denied having any significant weight loss or night sweats.  MEDICAL HISTORY: Past Medical History  Diagnosis Date  . Hypertension   . Renal insufficiency     stage 3 renal disease/ pt  . lung ca dx'd 02/2010  . 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  MEDICATIONS:  Current Outpatient Prescriptions  Medication Sig Dispense Refill  . acetaminophen (TYLENOL) 325 MG tablet Take 650 mg by mouth every 6 (six) hours as needed.        Marland Kitchen aspirin 81 MG tablet Take 81 mg by mouth daily.        . calcitRIOL (ROCALTROL) 0.25 MCG capsule Take 0.25 mcg by mouth every Monday, Wednesday, and Friday.      . clindamycin (CLEOCIN-T) 1 % external solution Apply topically 2 (two) times daily. Apply as directed  60 mL  1  . erlotinib (TARCEVA) 150 MG tablet Take 1 tablet (150 mg total) by mouth daily. Take on an empty stomach 1 hour before meals or 2 hours after.  30 tablet  1  . labetalol (NORMODYNE) 200 MG tablet Take 200 mg by mouth 2 (two) times daily.        . ondansetron (ZOFRAN) 8 MG tablet Take 1 tablet (8 mg total) by mouth every 8 (eight) hours as needed for nausea.  20 tablet  2  . polyethylene glycol (MIRALAX / GLYCOLAX) packet Take 17 g by mouth daily.      Marland Kitchen sulfamethoxazole-trimethoprim (BACTRIM DS) 800-160 MG per tablet Take 1 tablet by mouth 2 (two) times daily.      Marland Kitchen torsemide (DEMADEX) 20 MG tablet Take 20 mg by mouth daily.        Marland Kitchen azithromycin (ZITHROMAX) 250 MG tablet Use as instructed.  6 each  0   No current facility-administered medications for this visit.   Facility-Administered Medications Ordered in Other Visits  Medication Dose Route  Frequency Provider Last Rate Last Dose  . sodium chloride 0.9 % injection 10 mL  10 mL Intravenous PRN Curt Bears, MD   10 mL at 10/14/13 1030    SURGICAL HISTORY:  Past Surgical History  Procedure Laterality Date  . Portacath placement  11/03/08-[facility]    tip in mid SVC-E. Mansell    REVIEW OF SYSTEMS:  Constitutional: positive for fatigue Eyes: negative Ears, nose, mouth, throat, and face: negative Respiratory: positive for dyspnea on exertion Cardiovascular: negative Gastrointestinal: negative Genitourinary:negative Integument/breast: positive for dryness Hematologic/lymphatic: negative Musculoskeletal:negative Neurological: negative Behavioral/Psych: negative Endocrine: negative Allergic/Immunologic: negative   PHYSICAL EXAMINATION: General appearance: alert, cooperative and no distress Head: Normocephalic, without obvious abnormality, atraumatic Neck: no adenopathy, no carotid bruit, no JVD, supple, symmetrical, trachea midline, thyroid not enlarged, symmetric, no tenderness/mass/nodules and Has increased prominence  in the left supraclavicular area without distinct palpable mass or lymph node, likely  represents a fat pad Lymph nodes: Cervical, supraclavicular, and axillary nodes normal. Resp: clear to auscultation bilaterally and normal percussion bilaterally Back: symmetric, no curvature. ROM normal. No CVA tenderness. Cardio: regular rate and rhythm, S1, S2 normal, no murmur, click, rub or gallop GI: soft, non-tender; bowel sounds normal; no masses,  no organomegaly Extremities: extremities normal, atraumatic, no cyanosis or edema Neurologic: Alert and oriented X 3, normal strength and tone. Normal symmetric reflexes. Normal coordination and gait  ECOG PERFORMANCE STATUS: 1 - Symptomatic but completely ambulatory  Blood pressure 102/54, pulse 80, temperature 97.9 F (36.6 C), temperature source Oral, resp. rate 18, height 5' 6" (1.676 m), weight 231 lb 12.8 oz  (105.144 kg).  LABORATORY DATA: Lab Results  Component Value Date   WBC 4.6 01/15/2014   HGB 11.1* 01/15/2014   HCT 33.6* 01/15/2014   MCV 95.2 01/15/2014   PLT 174 01/15/2014      Chemistry      Component Value Date/Time   NA 141 01/15/2014 1001   NA 137 11/14/2012 0816   NA 143 07/11/2012 0927   K 3.8 01/15/2014 1001   K 3.9 11/14/2012 0816   K 4.5 07/11/2012 0927   CL 102 05/08/2013 0851   CL 101 11/14/2012 0816   CL 101 07/11/2012 0927   CO2 26 01/15/2014 1001   CO2 28 11/14/2012 0816   CO2 29 07/11/2012 0927   BUN 22.4 01/15/2014 1001   BUN 17 11/14/2012 0816   BUN 16 07/11/2012 0927   CREATININE 1.7* 01/15/2014 1001   CREATININE 1.52* 11/14/2012 0816   CREATININE 1.5* 07/11/2012 0927      Component Value Date/Time   CALCIUM 9.8 01/15/2014 1001   CALCIUM 10.2 11/14/2012 0816   CALCIUM 10.0 07/11/2012 0927   ALKPHOS 60 01/15/2014 1001   ALKPHOS 74 11/14/2012 0816   ALKPHOS 71 07/11/2012 0927   AST 15 01/15/2014 1001   AST 16 11/14/2012 0816   AST 19 07/11/2012 0927   ALT 11 01/15/2014 1001  ALT 8 11/14/2012 0816   ALT 18 07/11/2012 0927   BILITOT 1.22* 01/15/2014 1001   BILITOT 1.2 11/14/2012 0816   BILITOT 0.80 07/11/2012 0927       RADIOGRAPHIC STUDIES:  ASSESSMENT AND PLAN: This is a very pleasant 73 years old white female with metastatic non-small cell lung cancer currently on treatment with Tarceva 150 mg by mouth daily status post 17 months of treatment and tolerating it fairly well except for dry skin and few episodes of diarrhea resolved with Imodium. Patient was discussed with and also seen by Dr. [name]. She'll continue on Tarceva at 150 mg by mouth daily. She'll followup with Dr. [name] in one month with a restaging CT scan of the chest, abdomen and pelvis without contrast to reevaluate her disease. Should there be any seeing suspicious in the left supraclavicular area we can always do more specific imaging studies of the neck to further evaluate that area. She was  advised to call immediately if she has any concerning symptoms in the interval.  The patient voices understanding of current disease status and treatment options and is in agreement with the current care plan.  All questions were answered. The patient knows to call the clinic with any problems, questions or concerns. We can certainly see the patient much sooner if necessary.  Carlton Adam PA-C  ADDENDUM:  Hematology/Oncology Attending:  I had the face to face encounter with the patient. I recommended her care plan. This is a very pleasant 73 years old white female with metastatic non-small cell lung cancer, adenocarcinoma currently undergoing treatment with oral Tarceva 150 mg by mouth daily status post 17 months and tolerating it well with no significant adverse effects except for mild skin rash. The patient denied having any significant diarrhea. I advised her to continue her current treatment with Tarceva. I would see her back for followup visit in one month with repeat CT scan of the chest, abdomen and pelvis for restaging of her disease. She was advised to call immediately if she has any concerning symptoms in the interval.  Disclaimer: This note was dictated with voice recognition software. Similar sounding words can inadvertently be transcribed and may not be corrected upon review. Eilleen Kempf., MD 01/18/2014

## 2014-01-15 NOTE — Telephone Encounter (Signed)
, °

## 2014-02-10 ENCOUNTER — Ambulatory Visit (HOSPITAL_COMMUNITY)
Admission: RE | Admit: 2014-02-10 | Discharge: 2014-02-10 | Disposition: A | Discharge status: Home / Self Care | Payer: Medicare Other | Source: Ambulatory Visit | Attending: Physician Assistant | Admitting: Physician Assistant

## 2014-02-10 ENCOUNTER — Other Ambulatory Visit (HOSPITAL_BASED_OUTPATIENT_CLINIC_OR_DEPARTMENT_OTHER): Payer: Medicare Other

## 2014-02-10 ENCOUNTER — Ambulatory Visit (HOSPITAL_COMMUNITY): Payer: Medicare Other

## 2014-02-10 DIAGNOSIS — C3430 Malignant neoplasm of lower lobe, unspecified bronchus or lung: Secondary | ICD-10-CM

## 2014-02-10 DIAGNOSIS — C3490 Malignant neoplasm of unspecified part of unspecified bronchus or lung: Secondary | ICD-10-CM

## 2014-02-10 DIAGNOSIS — J984 Other disorders of lung: Secondary | ICD-10-CM | POA: Insufficient documentation

## 2014-02-10 LAB — CBC WITH DIFFERENTIAL/PLATELET
BASO%: 1.1 % (ref 0.0–2.0)
BASOS ABS: 0 10*3/uL (ref 0.0–0.1)
EOS%: 7.5 % — ABNORMAL HIGH (ref 0.0–7.0)
Eosinophils Absolute: 0.3 10*3/uL (ref 0.0–0.5)
HCT: 34.1 % — ABNORMAL LOW (ref 34.8–46.6)
HEMOGLOBIN: 11.4 g/dL — AB (ref 11.6–15.9)
LYMPH%: 14.1 % (ref 14.0–49.7)
MCH: 32 pg (ref 25.1–34.0)
MCHC: 33.6 g/dL (ref 31.5–36.0)
MCV: 95.3 fL (ref 79.5–101.0)
MONO#: 0.5 10*3/uL (ref 0.1–0.9)
MONO%: 11 % (ref 0.0–14.0)
NEUT#: 2.9 10*3/uL (ref 1.5–6.5)
NEUT%: 66.3 % (ref 38.4–76.8)
PLATELETS: 178 10*3/uL (ref 145–400)
RBC: 3.57 10*6/uL — ABNORMAL LOW (ref 3.70–5.45)
RDW: 14.8 % — ABNORMAL HIGH (ref 11.2–14.5)
WBC: 4.4 10*3/uL (ref 3.9–10.3)
lymph#: 0.6 10*3/uL — ABNORMAL LOW (ref 0.9–3.3)

## 2014-02-10 LAB — COMPREHENSIVE METABOLIC PANEL (CC13)
ALT: 11 U/L (ref 0–55)
ANION GAP: 9 meq/L (ref 3–11)
AST: 16 U/L (ref 5–34)
Albumin: 2.9 g/dL — ABNORMAL LOW (ref 3.5–5.0)
Alkaline Phosphatase: 60 U/L (ref 40–150)
BILIRUBIN TOTAL: 1.41 mg/dL — AB (ref 0.20–1.20)
BUN: 19.6 mg/dL (ref 7.0–26.0)
CO2: 27 meq/L (ref 22–29)
CREATININE: 1.6 mg/dL — AB (ref 0.6–1.1)
Calcium: 9.9 mg/dL (ref 8.4–10.4)
Chloride: 105 mEq/L (ref 98–109)
Glucose: 98 mg/dl (ref 70–140)
Potassium: 3.8 mEq/L (ref 3.5–5.1)
Sodium: 141 mEq/L (ref 136–145)
Total Protein: 5.9 g/dL — ABNORMAL LOW (ref 6.4–8.3)

## 2014-02-10 MED ORDER — IOHEXOL 300 MG/ML  SOLN: 50.0000 mL | Freq: Once | INTRAMUSCULAR | Status: AC | PRN

## 2014-02-12 ENCOUNTER — Encounter: Payer: Self-pay | Admitting: Internal Medicine

## 2014-02-12 ENCOUNTER — Encounter: Payer: Self-pay | Admitting: *Deleted

## 2014-02-12 ENCOUNTER — Ambulatory Visit (HOSPITAL_BASED_OUTPATIENT_CLINIC_OR_DEPARTMENT_OTHER): Payer: Medicare Other | Admitting: Internal Medicine

## 2014-02-12 ENCOUNTER — Telehealth: Payer: Self-pay | Admitting: Internal Medicine

## 2014-02-12 VITALS — BP 124/61 | HR 83 | Temp 97.9°F | Resp 19 | Ht 66.0 in | Wt 232.0 lb

## 2014-02-12 DIAGNOSIS — C3430 Malignant neoplasm of lower lobe, unspecified bronchus or lung: Secondary | ICD-10-CM

## 2014-02-12 DIAGNOSIS — C3490 Malignant neoplasm of unspecified part of unspecified bronchus or lung: Secondary | ICD-10-CM

## 2014-02-12 NOTE — Progress Notes (Signed)
RECEIVED A FAX FROM [facility] CONCERNING A CONFIRMATION OF PRESCRIPTION SHIPMENT FOR TARCEVA ON 3.18/15.

## 2014-02-12 NOTE — Progress Notes (Signed)
[facility] [phone]   [phone]  OFFICE PROGRESS NOTE  [name], MD [address] [place] [address]  DIAGNOSIS: Recurrent non-small cell lung cancer, adenocarcinoma initially diagnosed as stage IIA in March 2008. The tumor cells were negative for EGFR mutation as well as ALK gene translocation.   PRIOR THERAPY:  1. Status post concurrent chemoradiation with weekly carboplatin and paclitaxel. Last dose was given Apr 12, 2007. 2. Status post 3 cycles of consolidation chemotherapy with docetaxel. Last dose was given July 12, 2007. 3. 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 4. Status post 39 cycles of maintenance Alimta at a dose of 500 mg/m2. Last dose was given February 25, 2010, discontinued secondary to persistent anemia, but the patient has stable disease.  CURRENT THERAPY: Tarceva 150 mg by mouth daily. Status post approximately18 months of therapy.   CHEMOTHERAPY INTENT: Palliative  CURRENT # OF CHEMOTHERAPY CYCLES: 19 CURRENT ANTIEMETICS: Compazine when necessary  CURRENT SMOKING STATUS: Former smoker quit 11/27/2001  ORAL CHEMOTHERAPY AND CONSENT: Tarceva  CURRENT BISPHOSPHONATES USE: None  PAIN MANAGEMENT: 0/10  NARCOTICS INDUCED CONSTIPATION: None  LIVING WILL AND CODE STATUS: Full code initially but no prolonged resuscitation.    INTERVAL HISTORY: Diane [name] 73 y.o. female returns to the clinic today for followup visit. The patient is feeling fine today. She is tolerating her current treatment with Tarceva fairly well with no significant adverse effects. She denied having any significant skin rash but has few episodes of diarrhea. She has no significant fever or chills. She has no chest pain but continues to have shortness of breath with exertion with no hemoptysis. She denied having any significant weight loss or night sweats. She had  repeat CT scan of the chest, abdomen and pelvis performed recently and she is here for evaluation and discussion of her scan results.  MEDICAL HISTORY: Past Medical History  Diagnosis Date  . Hypertension   . Renal insufficiency     stage 3 renal disease/ pt  . lung ca dx'd 02/2010  . 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  MEDICATIONS:  Current Outpatient Prescriptions  Medication Sig Dispense Refill  . aspirin 81 MG tablet Take 81 mg by mouth daily.        . calcitRIOL (ROCALTROL) 0.25 MCG capsule Take 0.25 mcg by mouth every Monday, Wednesday, and Friday.      . clindamycin (CLEOCIN-T) 1 % external solution Apply topically 2 (two) times daily. Apply as directed  60 mL  1  . erlotinib (TARCEVA) 150 MG tablet Take 1 tablet (150 mg total) by mouth daily. Take on an empty stomach 1 hour before meals or 2 hours after.  30 tablet  1  . labetalol (NORMODYNE) 200 MG tablet Take 200 mg by mouth 2 (two) times daily.        . polyethylene glycol (MIRALAX / GLYCOLAX) packet Take 17 g by mouth daily.      Marland Kitchen torsemide (DEMADEX) 20 MG tablet Take 20 mg by mouth daily.        Marland Kitchen acetaminophen (TYLENOL) 325 MG tablet Take 650 mg by mouth every 6 (six) hours as needed.        . ondansetron (ZOFRAN) 8 MG tablet Take 1 tablet (8 mg total) by mouth every 8 (eight) hours as needed for nausea.  20 tablet  2  No current facility-administered medications for this visit.   Facility-Administered Medications Ordered in Other Visits  Medication Dose Route Frequency Provider Last Rate Last Dose  . sodium chloride 0.9 % injection 10 mL  10 mL Intravenous PRN Curt Bears, MD   10 mL at 10/14/13 1030    SURGICAL HISTORY:  Past Surgical History  Procedure Laterality Date  . Portacath placement  11/03/08-[facility]    tip in mid SVC-E. Mansell    REVIEW OF SYSTEMS:  Constitutional: positive for fatigue Eyes: negative Ears, nose, mouth, throat, and face:  negative Respiratory: positive for dyspnea on exertion Cardiovascular: negative Gastrointestinal: negative Genitourinary:negative Integument/breast: positive for dryness Hematologic/lymphatic: negative Musculoskeletal:negative Neurological: negative Behavioral/Psych: negative Endocrine: negative Allergic/Immunologic: negative   PHYSICAL EXAMINATION: General appearance: alert, cooperative and no distress Head: Normocephalic, without obvious abnormality, atraumatic Neck: no adenopathy, no carotid bruit, no JVD, supple, symmetrical, trachea midline and thyroid not enlarged, symmetric, no tenderness/mass/nodules Lymph nodes: Cervical, supraclavicular, and axillary nodes normal. Resp: clear to auscultation bilaterally and normal percussion bilaterally Back: symmetric, no curvature. ROM normal. No CVA tenderness. Cardio: regular rate and rhythm, S1, S2 normal, no murmur, click, rub or gallop GI: soft, non-tender; bowel sounds normal; no masses,  no organomegaly Extremities: extremities normal, atraumatic, no cyanosis or edema Neurologic: Alert and oriented X 3, normal strength and tone. Normal symmetric reflexes. Normal coordination and gait  ECOG PERFORMANCE STATUS: 1 - Symptomatic but completely ambulatory  Blood pressure 124/61, pulse 83, temperature 97.9 F (36.6 C), temperature source Oral, resp. rate 19, height $RemoveBe'5\' 6"'EISjLeOyI$  (1.676 m), weight 232 lb (105.235 kg), SpO2 98.00%.  LABORATORY DATA: Lab Results  Component Value Date   WBC 4.4 02/10/2014   HGB 11.4* 02/10/2014   HCT 34.1* 02/10/2014   MCV 95.3 02/10/2014   PLT 178 02/10/2014      Chemistry      Component Value Date/Time   NA 141 02/10/2014 0756   NA 137 11/14/2012 0816   NA 143 07/11/2012 0927   K 3.8 02/10/2014 0756   K 3.9 11/14/2012 0816   K 4.5 07/11/2012 0927   CL 102 05/08/2013 0851   CL 101 11/14/2012 0816   CL 101 07/11/2012 0927   CO2 27 02/10/2014 0756   CO2 28 11/14/2012 0816   CO2 29 07/11/2012 0927   BUN 19.6  02/10/2014 0756   BUN 17 11/14/2012 0816   BUN 16 07/11/2012 0927   CREATININE 1.6* 02/10/2014 0756   CREATININE 1.52* 11/14/2012 0816   CREATININE 1.5* 07/11/2012 0927      Component Value Date/Time   CALCIUM 9.9 02/10/2014 0756   CALCIUM 10.2 11/14/2012 0816   CALCIUM 10.0 07/11/2012 0927   ALKPHOS 60 02/10/2014 0756   ALKPHOS 74 11/14/2012 0816   ALKPHOS 71 07/11/2012 0927   AST 16 02/10/2014 0756   AST 16 11/14/2012 0816   AST 19 07/11/2012 0927   ALT 11 02/10/2014 0756   ALT 8 11/14/2012 0816   ALT 18 07/11/2012 0927   BILITOT 1.41* 02/10/2014 0756   BILITOT 1.2 11/14/2012 0816   BILITOT 0.80 07/11/2012 0927       RADIOGRAPHIC STUDIES: Ct Chest Wo Contrast  02/10/2014   CLINICAL DATA:  Malignant neoplasm of the bronchus.  EXAM: CT CHEST, ABDOMEN AND PELVIS WITHOUT CONTRAST  TECHNIQUE: Multidetector CT imaging of the chest, abdomen and pelvis was performed following the standard protocol without IV contrast.  COMPARISON:  None.  FINDINGS:   CT CHEST FINDINGS  There is a port in the  medial left chest wall. No axillary supraclavicular lymphadenopathy. No mediastinal lymphadenopathy.  There is a perihilar thickening on the right. There is masslike consolidation in the right lower lobe. These findings are not changed from comparison exam and likely related to radiation change. Within the right lower lobe there is a complex fluid collection with solid components measuring 13 x cm not changed in volume compared to prior.  There several ill-defined nodules in the right lung which are also unchanged. Example right middle lobe measures 9 mm compared 9 mm on prior (image 22). Left lung is clear.    CT ABDOMEN AND PELVIS FINDINGS  Non IV contrast images demonstrate no focal hepatic lesion. The pancreas, gallbladder, spleen, adrenal glands normal. Kidneys are normal.  The stomach, small bowel, cecum are normal. Colon rectosigmoid colon are normal.  Abdominal or is normal caliber. No retroperitoneal  periportal lymphadenopathy.  No free fluid the pelvis. Uterus bladder normal. There is a tiny gas bubble within the bladder (image 109).  No aggressive osseous.    IMPRESSION: 1. Stable postoperative change in the right hilum and right lower lobe. 2. Stable mixed density fluid collection with solid components in the right hemithorax. 3. No evidence metastasis within the abdomen or the pelvis. 4. Tiny gas bubble in the bladder. Recommend correlation for cystitis if no history of instrumentation.   Electronically Signed   By: Suzy Bouchard M.D.   On: 02/10/2014 12:07    ASSESSMENT AND PLAN: This is a very pleasant 73 years old white female with metastatic non-small cell lung cancer currently on treatment with Tarceva 150 mg by mouth daily status post 16 months of treatment and tolerating it fairly well except for dry skin.  She does not have any significant episodes of diarrhea except when she uses laxatives for constipation. Her recent CT scan of the chest, abdomen and pelvis showed no evidence for disease progression. I discussed the scan results with the patient today. I recommended for the patient to continue her current treatment with Tarceva 150 mg by mouth daily. She would come back for followup visit in one month for reevaluation with repeat CBC and comprehensive metabolic panel. The patient did not have any recent history of urinary tract infection or instrumentation of the urinary bladder. She does not have any urinary symptoms to explain the tiny gas bubble in the bladder. She was advised to call immediately if she has any concerning symptoms in the interval.  The patient voices understanding of current disease status and treatment options and is in agreement with the current care plan.  All questions were answered. The patient knows to call the clinic with any problems, questions or concerns. We can certainly see the patient much sooner if necessary. I spent 15 minutes of face-to-face  counseling with the patient today out of the total visit time 25 minutes.  Disclaimer: This note was dictated with voice recognition software. Similar sounding words can inadvertently be transcribed and may not be corrected upon review.

## 2014-02-12 NOTE — Telephone Encounter (Signed)
gv and printed appt sched and avs for pt for April  °

## 2014-03-09 ENCOUNTER — Other Ambulatory Visit: Payer: Self-pay | Admitting: *Deleted

## 2014-03-09 ENCOUNTER — Other Ambulatory Visit: Payer: Self-pay | Admitting: Medical Oncology

## 2014-03-09 DIAGNOSIS — C3490 Malignant neoplasm of unspecified part of unspecified bronchus or lung: Secondary | ICD-10-CM

## 2014-03-09 NOTE — Telephone Encounter (Signed)
THIS REFILL REQUEST FOR TARCEVA WAS GIVEN TO DR.MOHAMED'S NURSE, DIANE BELL,RN.

## 2014-03-12 ENCOUNTER — Telehealth: Payer: Self-pay | Admitting: Internal Medicine

## 2014-03-12 ENCOUNTER — Other Ambulatory Visit (HOSPITAL_BASED_OUTPATIENT_CLINIC_OR_DEPARTMENT_OTHER): Payer: Medicare Other

## 2014-03-12 ENCOUNTER — Encounter: Payer: Self-pay | Admitting: Internal Medicine

## 2014-03-12 ENCOUNTER — Ambulatory Visit (HOSPITAL_BASED_OUTPATIENT_CLINIC_OR_DEPARTMENT_OTHER): Payer: Medicare Other

## 2014-03-12 ENCOUNTER — Ambulatory Visit (HOSPITAL_BASED_OUTPATIENT_CLINIC_OR_DEPARTMENT_OTHER): Payer: Medicare Other | Admitting: Internal Medicine

## 2014-03-12 VITALS — BP 133/61 | HR 82 | Temp 97.9°F | Resp 21 | Ht 66.0 in | Wt 230.9 lb

## 2014-03-12 DIAGNOSIS — Z95828 Presence of other vascular implants and grafts: Secondary | ICD-10-CM

## 2014-03-12 DIAGNOSIS — C3490 Malignant neoplasm of unspecified part of unspecified bronchus or lung: Secondary | ICD-10-CM

## 2014-03-12 DIAGNOSIS — C3430 Malignant neoplasm of lower lobe, unspecified bronchus or lung: Secondary | ICD-10-CM

## 2014-03-12 LAB — CBC WITH DIFFERENTIAL/PLATELET
BASO%: 0.4 % (ref 0.0–2.0)
BASOS ABS: 0 10*3/uL (ref 0.0–0.1)
EOS ABS: 0.3 10*3/uL (ref 0.0–0.5)
EOS%: 5.8 % (ref 0.0–7.0)
HCT: 34.3 % — ABNORMAL LOW (ref 34.8–46.6)
HEMOGLOBIN: 11.1 g/dL — AB (ref 11.6–15.9)
LYMPH%: 11.2 % — AB (ref 14.0–49.7)
MCH: 31.1 pg (ref 25.1–34.0)
MCHC: 32.4 g/dL (ref 31.5–36.0)
MCV: 96.1 fL (ref 79.5–101.0)
MONO#: 0.4 10*3/uL (ref 0.1–0.9)
MONO%: 8.6 % (ref 0.0–14.0)
NEUT%: 74 % (ref 38.4–76.8)
NEUTROS ABS: 3.4 10*3/uL (ref 1.5–6.5)
PLATELETS: 156 10*3/uL (ref 145–400)
RBC: 3.57 10*6/uL — AB (ref 3.70–5.45)
RDW: 14.1 % (ref 11.2–14.5)
WBC: 4.7 10*3/uL (ref 3.9–10.3)
lymph#: 0.5 10*3/uL — ABNORMAL LOW (ref 0.9–3.3)

## 2014-03-12 LAB — COMPREHENSIVE METABOLIC PANEL (CC13)
ALK PHOS: 63 U/L (ref 40–150)
ALT: 6 U/L (ref 0–55)
ANION GAP: 7 meq/L (ref 3–11)
AST: 13 U/L (ref 5–34)
Albumin: 2.8 g/dL — ABNORMAL LOW (ref 3.5–5.0)
BILIRUBIN TOTAL: 1.38 mg/dL — AB (ref 0.20–1.20)
BUN: 16.4 mg/dL (ref 7.0–26.0)
CO2: 28 mEq/L (ref 22–29)
Calcium: 9.4 mg/dL (ref 8.4–10.4)
Chloride: 108 mEq/L (ref 98–109)
Creatinine: 1.4 mg/dL — ABNORMAL HIGH (ref 0.6–1.1)
GLUCOSE: 109 mg/dL (ref 70–140)
Potassium: 3.5 mEq/L (ref 3.5–5.1)
Sodium: 143 mEq/L (ref 136–145)
Total Protein: 5.7 g/dL — ABNORMAL LOW (ref 6.4–8.3)

## 2014-03-12 MED ORDER — ERLOTINIB HCL 150 MG PO TABS: 150.0000 mg | ORAL_TABLET | Freq: Every day | ORAL | Status: DC

## 2014-03-12 MED ORDER — SODIUM CHLORIDE 0.9 % IJ SOLN
10.0000 mL | INTRAMUSCULAR | Status: DC | PRN
  Administered 2014-03-12: 10 mL via INTRAVENOUS
  Filled 2014-03-12: qty 10

## 2014-03-12 MED ORDER — HEPARIN SOD (PORK) LOCK FLUSH 100 UNIT/ML IV SOLN
500.0000 [IU] | Freq: Once | INTRAVENOUS | Status: AC
  Administered 2014-03-12: 500 [IU] via INTRAVENOUS
  Filled 2014-03-12: qty 5

## 2014-03-12 NOTE — Telephone Encounter (Signed)
gv and printed appt sched and avs for pt for May °

## 2014-03-12 NOTE — Telephone Encounter (Signed)
Tarceva refill faxed to [facility].

## 2014-03-12 NOTE — Progress Notes (Signed)
[facility] [phone]   [phone]  OFFICE PROGRESS NOTE  [name], MD [address] [place] [address]  DIAGNOSIS: Recurrent non-small cell lung cancer, adenocarcinoma initially diagnosed as stage IIA in March 2008. The tumor cells were negative for EGFR mutation as well as ALK gene translocation.   PRIOR THERAPY:  1. Status post concurrent chemoradiation with weekly carboplatin and paclitaxel. Last dose was given Apr 12, 2007. 2. Status post 3 cycles of consolidation chemotherapy with docetaxel. Last dose was given July 12, 2007. 3. 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 4. Status post 39 cycles of maintenance Alimta at a dose of 500 mg/m2. Last dose was given February 25, 2010, discontinued secondary to persistent anemia, but the patient has stable disease.  CURRENT THERAPY: Tarceva 150 mg by mouth daily. Status post approximately19 months of therapy.   CHEMOTHERAPY INTENT: Palliative  CURRENT # OF CHEMOTHERAPY CYCLES: 20 CURRENT ANTIEMETICS: Compazine when necessary  CURRENT SMOKING STATUS: Former smoker quit 11/27/2001  ORAL CHEMOTHERAPY AND CONSENT: Tarceva  CURRENT BISPHOSPHONATES USE: None  PAIN MANAGEMENT: 0/10  NARCOTICS INDUCED CONSTIPATION: None  LIVING WILL AND CODE STATUS: Full code initially but no prolonged resuscitation.    INTERVAL HISTORY: Diane [name] 73 y.o. female returns to the clinic today for followup visit. The patient is feeling fine today. She is tolerating her current treatment with Tarceva fairly well with no significant adverse effects. She denied having any significant skin rash except for some sores in her head. She has few episodes of diarrhea. She has no significant fever or chills. She has no chest pain but continues to have shortness of breath with exertion with no hemoptysis. She denied having any significant weight  loss or night sweats.   MEDICAL HISTORY: Past Medical History  Diagnosis Date  . Hypertension   . Renal insufficiency     stage 3 renal disease/ pt  . lung ca dx'd 02/2010  . 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  MEDICATIONS:  Current Outpatient Prescriptions  Medication Sig Dispense Refill  . acetaminophen (TYLENOL) 325 MG tablet Take 650 mg by mouth every 6 (six) hours as needed.        Marland Kitchen aspirin 81 MG tablet Take 81 mg by mouth daily.        . calcitRIOL (ROCALTROL) 0.25 MCG capsule Take 0.25 mcg by mouth every Monday, Wednesday, and Friday.      . clindamycin (CLEOCIN-T) 1 % external solution Apply topically 2 (two) times daily. Apply as directed  60 mL  1  . erlotinib (TARCEVA) 150 MG tablet Take 1 tablet (150 mg total) by mouth daily. Take on an empty stomach 1 hour before meals or 2 hours after.  30 tablet  1  . labetalol (NORMODYNE) 200 MG tablet Take 200 mg by mouth 2 (two) times daily.        . ondansetron (ZOFRAN) 8 MG tablet Take 1 tablet (8 mg total) by mouth every 8 (eight) hours as needed for nausea.  20 tablet  2  . polyethylene glycol (MIRALAX / GLYCOLAX) packet Take 17 g by mouth daily.      Marland Kitchen torsemide (DEMADEX) 20 MG tablet Take 20 mg by mouth daily.         No current facility-administered medications for this visit.   Facility-Administered Medications Ordered in Other Visits  Medication Dose Route Frequency Provider Last Rate Last Dose  . sodium chloride 0.9 % injection 10 mL  10 mL Intravenous PRN Curt Bears, MD   10 mL at 10/14/13 1030    SURGICAL HISTORY:  Past Surgical History  Procedure Laterality Date  . Portacath placement  11/03/08-[facility]    tip in mid SVC-E. Mansell    REVIEW OF SYSTEMS:  Constitutional: positive for fatigue Eyes: negative Ears, nose, mouth, throat, and face: negative Respiratory: positive for dyspnea on exertion Cardiovascular: negative Gastrointestinal:  negative Genitourinary:negative Integument/breast: positive for dryness Hematologic/lymphatic: negative Musculoskeletal:negative Neurological: negative Behavioral/Psych: negative Endocrine: negative Allergic/Immunologic: negative   PHYSICAL EXAMINATION: General appearance: alert, cooperative and no distress Head: Normocephalic, without obvious abnormality, atraumatic Neck: no adenopathy, no carotid bruit, no JVD, supple, symmetrical, trachea midline and thyroid not enlarged, symmetric, no tenderness/mass/nodules Lymph nodes: Cervical, supraclavicular, and axillary nodes normal. Resp: clear to auscultation bilaterally and normal percussion bilaterally Back: symmetric, no curvature. ROM normal. No CVA tenderness. Cardio: regular rate and rhythm, S1, S2 normal, no murmur, click, rub or gallop GI: soft, non-tender; bowel sounds normal; no masses,  no organomegaly Extremities: extremities normal, atraumatic, no cyanosis or edema Neurologic: Alert and oriented X 3, normal strength and tone. Normal symmetric reflexes. Normal coordination and gait  ECOG PERFORMANCE STATUS: 1 - Symptomatic but completely ambulatory  Blood pressure 133/61, pulse 82, temperature 97.9 F (36.6 C), temperature source Oral, resp. rate 21, height _0  (1.676 m), weight 230 lb 14.4 oz (104.736 kg), SpO2 97.00%.  LABORATORY DATA: Lab Results  Component Value Date   WBC 4.7 03/12/2014   HGB 11.1* 03/12/2014   HCT 34.3* 03/12/2014   MCV 96.1 03/12/2014   PLT 156 03/12/2014      Chemistry      Component Value Date/Time   NA 143 03/12/2014 1104   NA 137 11/14/2012 0816   NA 143 07/11/2012 0927   K 3.5 03/12/2014 1104   K 3.9 11/14/2012 0816   K 4.5 07/11/2012 0927   CL 102 05/08/2013 0851   CL 101 11/14/2012 0816   CL 101 07/11/2012 0927   CO2 28 03/12/2014 1104   CO2 28 11/14/2012 0816   CO2 29 07/11/2012 0927   BUN 16.4 03/12/2014 1104   BUN 17 11/14/2012 0816   BUN 16 07/11/2012 0927   CREATININE 1.4* 03/12/2014  1104   CREATININE 1.52* 11/14/2012 0816   CREATININE 1.5* 07/11/2012 0927      Component Value Date/Time   CALCIUM 9.4 03/12/2014 1104   CALCIUM 10.2 11/14/2012 0816   CALCIUM 10.0 07/11/2012 0927   ALKPHOS 63 03/12/2014 1104   ALKPHOS 74 11/14/2012 0816   ALKPHOS 71 07/11/2012 0927   AST 13 03/12/2014 1104   AST 16 11/14/2012 0816   AST 19 07/11/2012 0927   ALT 6 03/12/2014 1104   ALT 8 11/14/2012 0816   ALT 18 07/11/2012 0927   BILITOT 1.38* 03/12/2014 1104   BILITOT 1.2 11/14/2012 0816   BILITOT 0.80 07/11/2012 0927       RADIOGRAPHIC STUDIES:  ASSESSMENT AND PLAN: This is a very pleasant 73 years old white female with metastatic non-small cell lung cancer currently on treatment with Tarceva 150 mg by mouth daily status post 16 months of treatment and tolerating it fairly well except for dry skin.  She does not have any significant episodes of diarrhea except when she uses laxatives for constipation. I recommended for the patient to continue her current treatment with Tarceva 150 mg by  mouth daily. She would come back for followup visit in one month for reevaluation with repeat CBC and comprehensive metabolic panel. She was advised to call immediately if she has any concerning symptoms in the interval.  The patient voices understanding of current disease status and treatment options and is in agreement with the current care plan.  All questions were answered. The patient knows to call the clinic with any problems, questions or concerns. We can certainly see the patient much sooner if necessary.  Disclaimer: This note was dictated with voice recognition software. Similar sounding words can inadvertently be transcribed and may not be corrected upon review.

## 2014-03-16 NOTE — Progress Notes (Signed)
Received fax from [facility] reporting pt's Tarceva shipped on 4/17 for next day delivery. dph

## 2014-04-08 ENCOUNTER — Encounter: Payer: Self-pay | Admitting: Internal Medicine

## 2014-04-08 ENCOUNTER — Telehealth: Payer: Self-pay | Admitting: Internal Medicine

## 2014-04-08 ENCOUNTER — Other Ambulatory Visit (HOSPITAL_BASED_OUTPATIENT_CLINIC_OR_DEPARTMENT_OTHER): Payer: Medicare Other

## 2014-04-08 ENCOUNTER — Ambulatory Visit (HOSPITAL_BASED_OUTPATIENT_CLINIC_OR_DEPARTMENT_OTHER): Payer: Medicare Other | Admitting: Internal Medicine

## 2014-04-08 VITALS — BP 108/67 | HR 82 | Temp 97.7°F | Resp 19 | Ht 66.0 in | Wt 229.0 lb

## 2014-04-08 DIAGNOSIS — C3490 Malignant neoplasm of unspecified part of unspecified bronchus or lung: Secondary | ICD-10-CM

## 2014-04-08 DIAGNOSIS — C3430 Malignant neoplasm of lower lobe, unspecified bronchus or lung: Secondary | ICD-10-CM

## 2014-04-08 LAB — CBC WITH DIFFERENTIAL/PLATELET
BASO%: 1 % (ref 0.0–2.0)
BASOS ABS: 0 10*3/uL (ref 0.0–0.1)
EOS ABS: 0.3 10*3/uL (ref 0.0–0.5)
EOS%: 5.6 % (ref 0.0–7.0)
HEMATOCRIT: 35 % (ref 34.8–46.6)
HGB: 11.5 g/dL — ABNORMAL LOW (ref 11.6–15.9)
LYMPH%: 12.1 % — AB (ref 14.0–49.7)
MCH: 31.9 pg (ref 25.1–34.0)
MCHC: 32.9 g/dL (ref 31.5–36.0)
MCV: 96.8 fL (ref 79.5–101.0)
MONO#: 0.5 10*3/uL (ref 0.1–0.9)
MONO%: 10 % (ref 0.0–14.0)
NEUT%: 71.3 % (ref 38.4–76.8)
NEUTROS ABS: 3.3 10*3/uL (ref 1.5–6.5)
PLATELETS: 180 10*3/uL (ref 145–400)
RBC: 3.62 10*6/uL — AB (ref 3.70–5.45)
RDW: 13.8 % (ref 11.2–14.5)
WBC: 4.7 10*3/uL (ref 3.9–10.3)
lymph#: 0.6 10*3/uL — ABNORMAL LOW (ref 0.9–3.3)

## 2014-04-08 LAB — COMPREHENSIVE METABOLIC PANEL (CC13)
ALK PHOS: 64 U/L (ref 40–150)
ALT: 10 U/L (ref 0–55)
AST: 14 U/L (ref 5–34)
Albumin: 2.9 g/dL — ABNORMAL LOW (ref 3.5–5.0)
Anion Gap: 12 mEq/L — ABNORMAL HIGH (ref 3–11)
BILIRUBIN TOTAL: 1.39 mg/dL — AB (ref 0.20–1.20)
BUN: 23.2 mg/dL (ref 7.0–26.0)
CO2: 25 meq/L (ref 22–29)
CREATININE: 1.7 mg/dL — AB (ref 0.6–1.1)
Calcium: 10.1 mg/dL (ref 8.4–10.4)
Chloride: 106 mEq/L (ref 98–109)
Glucose: 97 mg/dl (ref 70–140)
Potassium: 4 mEq/L (ref 3.5–5.1)
SODIUM: 143 meq/L (ref 136–145)
Total Protein: 5.8 g/dL — ABNORMAL LOW (ref 6.4–8.3)

## 2014-04-08 NOTE — Progress Notes (Signed)
[place] Telephone:([phone]) [phone]   Fax:([phone]  OFFICE PROGRESS NOTE  [name], MD [address] [place] [address]  DIAGNOSIS: Recurrent non-small cell lung cancer, adenocarcinoma initially diagnosed as stage IIA in March 2008. The tumor cells were negative for EGFR mutation as well as ALK gene translocation.   PRIOR THERAPY:  1. Status post concurrent chemoradiation with weekly carboplatin and paclitaxel. Last dose was given Apr 12, 2007. 2. Status post 3 cycles of consolidation chemotherapy with docetaxel. Last dose was given July 12, 2007. 3. 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 4. Status post 39 cycles of maintenance Alimta at a dose of 500 mg/m2. Last dose was given February 25, 2010, discontinued secondary to persistent anemia, but the patient has stable disease.  CURRENT THERAPY: Tarceva 150 mg by mouth daily. Status post approximately 20 months of therapy.   CHEMOTHERAPY INTENT: Palliative  CURRENT # OF CHEMOTHERAPY CYCLES: 21 CURRENT ANTIEMETICS: Compazine when necessary  CURRENT SMOKING STATUS: Former smoker quit 11/27/2001  ORAL CHEMOTHERAPY AND CONSENT: Tarceva  CURRENT BISPHOSPHONATES USE: None  PAIN MANAGEMENT: 0/10  NARCOTICS INDUCED CONSTIPATION: None  LIVING WILL AND CODE STATUS: Full code initially but no prolonged resuscitation.    INTERVAL HISTORY: Diane [name] 73 y.o. female returns to the clinic today for followup visit. The patient is feeling fine today. She is tolerating her current treatment with Tarceva fairly well with no significant adverse effects except for grade 1 skin rash on the face. She lost few pounds since her last visit. She has few episodes of diarrhea. She has no significant fever or chills no nausea or vomiting. She has no chest pain but continues to have shortness of breath with exertion with no hemoptysis.   MEDICAL  HISTORY: Past Medical History  Diagnosis Date  . Hypertension   . Renal insufficiency     stage 3 renal disease/ pt  . lung ca dx'd 02/2010  . 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  MEDICATIONS:  Current Outpatient Prescriptions  Medication Sig Dispense Refill  . aspirin 81 MG tablet Take 81 mg by mouth daily.        . calcitRIOL (ROCALTROL) 0.25 MCG capsule Take 0.25 mcg by mouth every Monday, Wednesday, and Friday.      . clindamycin (CLEOCIN-T) 1 % external solution Apply topically 2 (two) times daily. Apply as directed  60 mL  1  . clobetasol (TEMOVATE) 0.05 % external solution       . emollient cream Apply 1 application topically 4 (four) times daily. For tarceva skin rash      . erlotinib (TARCEVA) 150 MG tablet Take 1 tablet (150 mg total) by mouth daily. Take on an empty stomach 1 hour before meals or 2 hours after.  30 tablet  1  . labetalol (NORMODYNE) 200 MG tablet Take 200 mg by mouth 2 (two) times daily.        . polyethylene glycol (MIRALAX / GLYCOLAX) packet Take 17 g by mouth daily.      Marland Kitchen torsemide (DEMADEX) 20 MG tablet Take 20 mg by mouth daily.         No current facility-administered medications for this visit.   Facility-Administered Medications Ordered in Other Visits  Medication Dose Route Frequency Provider Last Rate Last Dose  . sodium chloride 0.9 % injection 10 mL  10 mL  Intravenous PRN Curt Bears, MD   10 mL at 10/14/13 1030    SURGICAL HISTORY:  Past Surgical History  Procedure Laterality Date  . Portacath placement  11/03/08-[facility]    tip in mid SVC-E. Mansell    REVIEW OF SYSTEMS:  Constitutional: positive for fatigue Eyes: negative Ears, nose, mouth, throat, and face: negative Respiratory: positive for dyspnea on exertion Cardiovascular: negative Gastrointestinal: negative Genitourinary:negative Integument/breast: positive for dryness Hematologic/lymphatic:  negative Musculoskeletal:negative Neurological: negative Behavioral/Psych: negative Endocrine: negative Allergic/Immunologic: negative   PHYSICAL EXAMINATION: General appearance: alert, cooperative and no distress Head: Normocephalic, without obvious abnormality, atraumatic Neck: no adenopathy, no carotid bruit, no JVD, supple, symmetrical, trachea midline and thyroid not enlarged, symmetric, no tenderness/mass/nodules Lymph nodes: Cervical, supraclavicular, and axillary nodes normal. Resp: clear to auscultation bilaterally and normal percussion bilaterally Back: symmetric, no curvature. ROM normal. No CVA tenderness. Cardio: regular rate and rhythm, S1, S2 normal, no murmur, click, rub or gallop GI: soft, non-tender; bowel sounds normal; no masses,  no organomegaly Extremities: extremities normal, atraumatic, no cyanosis or edema Neurologic: Alert and oriented X 3, normal strength and tone. Normal symmetric reflexes. Normal coordination and gait  ECOG PERFORMANCE STATUS: 1 - Symptomatic but completely ambulatory  Blood pressure 108/67, pulse 82, temperature 97.7 F (36.5 C), temperature source Oral, resp. rate 19, height _0  (1.676 m), weight 229 lb (103.874 kg).  LABORATORY DATA: Lab Results  Component Value Date   WBC 4.7 04/08/2014   HGB 11.5* 04/08/2014   HCT 35.0 04/08/2014   MCV 96.8 04/08/2014   PLT 180 04/08/2014      Chemistry      Component Value Date/Time   NA 143 03/12/2014 1104   NA 137 11/14/2012 0816   NA 143 07/11/2012 0927   K 3.5 03/12/2014 1104   K 3.9 11/14/2012 0816   K 4.5 07/11/2012 0927   CL 102 05/08/2013 0851   CL 101 11/14/2012 0816   CL 101 07/11/2012 0927   CO2 28 03/12/2014 1104   CO2 28 11/14/2012 0816   CO2 29 07/11/2012 0927   BUN 16.4 03/12/2014 1104   BUN 17 11/14/2012 0816   BUN 16 07/11/2012 0927   CREATININE 1.4* 03/12/2014 1104   CREATININE 1.52* 11/14/2012 0816   CREATININE 1.5* 07/11/2012 0927      Component Value Date/Time   CALCIUM  9.4 03/12/2014 1104   CALCIUM 10.2 11/14/2012 0816   CALCIUM 10.0 07/11/2012 0927   ALKPHOS 63 03/12/2014 1104   ALKPHOS 74 11/14/2012 0816   ALKPHOS 71 07/11/2012 0927   AST 13 03/12/2014 1104   AST 16 11/14/2012 0816   AST 19 07/11/2012 0927   ALT 6 03/12/2014 1104   ALT 8 11/14/2012 0816   ALT 18 07/11/2012 0927   BILITOT 1.38* 03/12/2014 1104   BILITOT 1.2 11/14/2012 0816   BILITOT 0.80 07/11/2012 0927       RADIOGRAPHIC STUDIES:  ASSESSMENT AND PLAN: This is a very pleasant 73 years old white female with metastatic non-small cell lung cancer currently on treatment with Tarceva 150 mg by mouth daily status post 20 months of treatment and tolerating it fairly well except for dry skin and mild skin rash on the face.  I recommended for the patient to continue her current treatment with Tarceva 150 mg by mouth daily. She would come back for followup visit in one month for reevaluation with repeat CBC and comprehensive metabolic panel as well as repeat CT scan of the chest, abdomen and pelvis without  contrast. She will continue to have Port-A-Cath flush every 8 weeks. She was advised to call immediately if she has any concerning symptoms in the interval.  The patient voices understanding of current disease status and treatment options and is in agreement with the current care plan.  All questions were answered. The patient knows to call the clinic with any problems, questions or concerns. We can certainly see the patient much sooner if necessary.  Disclaimer: This note was dictated with voice recognition software. Similar sounding words can inadvertently be transcribed and may not be corrected upon review.

## 2014-04-08 NOTE — Telephone Encounter (Signed)
gv and prnted appt sched and avs for pt for June.Marland KitchenMarland Kitchenpt wants water based

## 2014-05-06 ENCOUNTER — Ambulatory Visit (HOSPITAL_COMMUNITY)
Admission: RE | Admit: 2014-05-06 | Discharge: 2014-05-06 | Disposition: A | Discharge status: Home / Self Care | Payer: Medicare Other | Source: Ambulatory Visit | Attending: Internal Medicine | Admitting: Internal Medicine

## 2014-05-06 ENCOUNTER — Other Ambulatory Visit (HOSPITAL_BASED_OUTPATIENT_CLINIC_OR_DEPARTMENT_OTHER): Payer: Medicare Other

## 2014-05-06 ENCOUNTER — Ambulatory Visit (HOSPITAL_BASED_OUTPATIENT_CLINIC_OR_DEPARTMENT_OTHER): Payer: Medicare Other

## 2014-05-06 VITALS — BP 151/68 | HR 86 | Temp 97.0°F

## 2014-05-06 DIAGNOSIS — Z95828 Presence of other vascular implants and grafts: Secondary | ICD-10-CM

## 2014-05-06 DIAGNOSIS — Z9221 Personal history of antineoplastic chemotherapy: Secondary | ICD-10-CM | POA: Insufficient documentation

## 2014-05-06 DIAGNOSIS — C3490 Malignant neoplasm of unspecified part of unspecified bronchus or lung: Secondary | ICD-10-CM

## 2014-05-06 DIAGNOSIS — N289 Disorder of kidney and ureter, unspecified: Secondary | ICD-10-CM | POA: Insufficient documentation

## 2014-05-06 DIAGNOSIS — J984 Other disorders of lung: Secondary | ICD-10-CM | POA: Insufficient documentation

## 2014-05-06 DIAGNOSIS — J438 Other emphysema: Secondary | ICD-10-CM | POA: Insufficient documentation

## 2014-05-06 DIAGNOSIS — R0602 Shortness of breath: Secondary | ICD-10-CM | POA: Insufficient documentation

## 2014-05-06 DIAGNOSIS — K449 Diaphragmatic hernia without obstruction or gangrene: Secondary | ICD-10-CM | POA: Insufficient documentation

## 2014-05-06 DIAGNOSIS — Z923 Personal history of irradiation: Secondary | ICD-10-CM | POA: Insufficient documentation

## 2014-05-06 DIAGNOSIS — I2510 Atherosclerotic heart disease of native coronary artery without angina pectoris: Secondary | ICD-10-CM | POA: Insufficient documentation

## 2014-05-06 DIAGNOSIS — C3430 Malignant neoplasm of lower lobe, unspecified bronchus or lung: Secondary | ICD-10-CM

## 2014-05-06 DIAGNOSIS — E041 Nontoxic single thyroid nodule: Secondary | ICD-10-CM | POA: Insufficient documentation

## 2014-05-06 DIAGNOSIS — Z452 Encounter for adjustment and management of vascular access device: Secondary | ICD-10-CM

## 2014-05-06 DIAGNOSIS — I517 Cardiomegaly: Secondary | ICD-10-CM | POA: Insufficient documentation

## 2014-05-06 LAB — COMPREHENSIVE METABOLIC PANEL (CC13)
ALBUMIN: 2.9 g/dL — AB (ref 3.5–5.0)
ALT: 11 U/L (ref 0–55)
ANION GAP: 9 meq/L (ref 3–11)
AST: 14 U/L (ref 5–34)
Alkaline Phosphatase: 64 U/L (ref 40–150)
BUN: 18 mg/dL (ref 7.0–26.0)
CO2: 27 mEq/L (ref 22–29)
Calcium: 9.8 mg/dL (ref 8.4–10.4)
Chloride: 103 mEq/L (ref 98–109)
Creatinine: 1.6 mg/dL — ABNORMAL HIGH (ref 0.6–1.1)
GLUCOSE: 101 mg/dL (ref 70–140)
POTASSIUM: 3.7 meq/L (ref 3.5–5.1)
Sodium: 139 mEq/L (ref 136–145)
Total Bilirubin: 1.28 mg/dL — ABNORMAL HIGH (ref 0.20–1.20)
Total Protein: 6 g/dL — ABNORMAL LOW (ref 6.4–8.3)

## 2014-05-06 LAB — CBC WITH DIFFERENTIAL/PLATELET
BASO%: 0.8 % (ref 0.0–2.0)
BASOS ABS: 0 10*3/uL (ref 0.0–0.1)
EOS ABS: 0.3 10*3/uL (ref 0.0–0.5)
EOS%: 5.8 % (ref 0.0–7.0)
HCT: 34.7 % — ABNORMAL LOW (ref 34.8–46.6)
HEMOGLOBIN: 11.7 g/dL (ref 11.6–15.9)
LYMPH#: 0.5 10*3/uL — AB (ref 0.9–3.3)
LYMPH%: 10.9 % — ABNORMAL LOW (ref 14.0–49.7)
MCH: 31.9 pg (ref 25.1–34.0)
MCHC: 33.6 g/dL (ref 31.5–36.0)
MCV: 95.1 fL (ref 79.5–101.0)
MONO#: 0.5 10*3/uL (ref 0.1–0.9)
MONO%: 9.7 % (ref 0.0–14.0)
NEUT%: 72.8 % (ref 38.4–76.8)
NEUTROS ABS: 3.7 10*3/uL (ref 1.5–6.5)
Platelets: 176 10*3/uL (ref 145–400)
RBC: 3.65 10*6/uL — ABNORMAL LOW (ref 3.70–5.45)
RDW: 13.6 % (ref 11.2–14.5)
WBC: 5.1 10*3/uL (ref 3.9–10.3)

## 2014-05-06 MED ORDER — IOHEXOL 300 MG/ML  SOLN
50.0000 mL | Freq: Once | INTRAMUSCULAR | Status: AC | PRN
  Administered 2014-05-06: 50 mL via ORAL

## 2014-05-06 MED ORDER — SODIUM CHLORIDE 0.9 % IJ SOLN
10.0000 mL | INTRAMUSCULAR | Status: DC | PRN
  Administered 2014-05-06: 10 mL via INTRAVENOUS
  Filled 2014-05-06: qty 10

## 2014-05-06 MED ORDER — HEPARIN SOD (PORK) LOCK FLUSH 100 UNIT/ML IV SOLN
500.0000 [IU] | Freq: Once | INTRAVENOUS | Status: AC
Start: 2014-05-06 — End: 2014-05-06
  Administered 2014-05-06: 500 [IU] via INTRAVENOUS
  Filled 2014-05-06: qty 5

## 2014-05-06 NOTE — Patient Instructions (Signed)

## 2014-05-13 ENCOUNTER — Encounter: Payer: Self-pay | Admitting: Internal Medicine

## 2014-05-13 ENCOUNTER — Ambulatory Visit (HOSPITAL_BASED_OUTPATIENT_CLINIC_OR_DEPARTMENT_OTHER): Payer: Medicare Other | Admitting: Internal Medicine

## 2014-05-13 VITALS — BP 123/69 | HR 85 | Temp 97.8°F | Resp 18 | Ht 66.0 in | Wt 228.5 lb

## 2014-05-13 DIAGNOSIS — T451X5A Adverse effect of antineoplastic and immunosuppressive drugs, initial encounter: Secondary | ICD-10-CM

## 2014-05-13 DIAGNOSIS — R21 Rash and other nonspecific skin eruption: Secondary | ICD-10-CM

## 2014-05-13 DIAGNOSIS — C3430 Malignant neoplasm of lower lobe, unspecified bronchus or lung: Secondary | ICD-10-CM

## 2014-05-13 DIAGNOSIS — C3490 Malignant neoplasm of unspecified part of unspecified bronchus or lung: Secondary | ICD-10-CM

## 2014-05-13 MED ORDER — METHYLPREDNISOLONE (PAK) 4 MG PO TABS: ORAL_TABLET | ORAL | Status: DC

## 2014-05-13 NOTE — Progress Notes (Signed)
[place] Telephone:(336) [phone]   Fax:(336) [phone]  OFFICE PROGRESS NOTE  [name], MD [address] [place] [address]  DIAGNOSIS: Recurrent non-small cell lung cancer, adenocarcinoma initially diagnosed as stage IIA in March 2008. The tumor cells were negative for EGFR mutation as well as ALK gene translocation.   PRIOR THERAPY:  1. Status post concurrent chemoradiation with weekly carboplatin and paclitaxel. Last dose was given Apr 12, 2007. 2. Status post 3 cycles of consolidation chemotherapy with docetaxel. Last dose was given July 12, 2007. 3. 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 4. Status post 39 cycles of maintenance Alimta at a dose of 500 mg/m2. Last dose was given February 25, 2010, discontinued secondary to persistent anemia, but the patient has stable disease.  CURRENT THERAPY: Tarceva 150 mg by mouth daily. Status post approximately 21 months of therapy.   CHEMOTHERAPY INTENT: Palliative  CURRENT # OF CHEMOTHERAPY CYCLES: 22 CURRENT ANTIEMETICS: Compazine when necessary  CURRENT SMOKING STATUS: Former smoker quit 11/27/2001  ORAL CHEMOTHERAPY AND CONSENT: Tarceva  CURRENT BISPHOSPHONATES USE: None  PAIN MANAGEMENT: 0/10  NARCOTICS INDUCED CONSTIPATION: None  LIVING WILL AND CODE STATUS: Full code initially but no prolonged resuscitation.    INTERVAL HISTORY: Diane [name] 73 y.o. female returns to the clinic today for followup visit. The patient is feeling fine today. She is tolerating her current treatment with Tarceva fairly well with no significant adverse effects except for grade 1-2 skin rash on the face, scalp and leg. She denied having any diarrhea. She has no significant fever or chills no nausea or vomiting. She has no chest pain but continues to have shortness of breath with exertion with no hemoptysis. She had repeat CT scan of the chest,  abdomen and pelvis performed recently and she is here for evaluation and discussion of her scan results.  MEDICAL HISTORY: Past Medical History  Diagnosis Date  . Hypertension   . Renal insufficiency     stage 3 renal disease/ pt  . lung ca dx'd 02/2010  . 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  MEDICATIONS:  Current Outpatient Prescriptions  Medication Sig Dispense Refill  . aspirin 81 MG tablet Take 81 mg by mouth daily.        . calcitRIOL (ROCALTROL) 0.25 MCG capsule Take 0.25 mcg by mouth every Monday, Wednesday, and Friday.      . clindamycin (CLEOCIN-T) 1 % external solution Apply topically 2 (two) times daily. Apply as directed  60 mL  1  . erlotinib (TARCEVA) 150 MG tablet Take 1 tablet (150 mg total) by mouth daily. Take on an empty stomach 1 hour before meals or 2 hours after.  30 tablet  1  . labetalol (NORMODYNE) 200 MG tablet Take 200 mg by mouth 2 (two) times daily.        . polyethylene glycol (MIRALAX / GLYCOLAX) packet Take 17 g by mouth daily.      Marland Kitchen torsemide (DEMADEX) 20 MG tablet Take 20 mg by mouth daily.        Marland Kitchen loperamide (IMODIUM A-D) 2 MG tablet Take 2 mg by mouth 4 (four) times daily as needed for diarrhea or loose stools.       No current facility-administered medications for this visit.   Facility-Administered Medications Ordered in Other Visits  Medication Dose Route Frequency Provider Last Rate Last  Dose  . sodium chloride 0.9 % injection 10 mL  10 mL Intravenous PRN Curt Bears, MD   10 mL at 10/14/13 1030    SURGICAL HISTORY:  Past Surgical History  Procedure Laterality Date  . Portacath placement  11/03/08-[facility]    tip in mid SVC-E. Mansell    REVIEW OF SYSTEMS:  Constitutional: positive for fatigue Eyes: negative Ears, nose, mouth, throat, and face: negative Respiratory: positive for dyspnea on exertion Cardiovascular: negative Gastrointestinal:  negative Genitourinary:negative Integument/breast: positive for dryness Hematologic/lymphatic: negative Musculoskeletal:negative Neurological: negative Behavioral/Psych: negative Endocrine: negative Allergic/Immunologic: negative   PHYSICAL EXAMINATION: General appearance: alert, cooperative and no distress Head: Normocephalic, without obvious abnormality, atraumatic Neck: no adenopathy, no carotid bruit, no JVD, supple, symmetrical, trachea midline and thyroid not enlarged, symmetric, no tenderness/mass/nodules Lymph nodes: Cervical, supraclavicular, and axillary nodes normal. Resp: clear to auscultation bilaterally and normal percussion bilaterally Back: symmetric, no curvature. ROM normal. No CVA tenderness. Cardio: regular rate and rhythm, S1, S2 normal, no murmur, click, rub or gallop GI: soft, non-tender; bowel sounds normal; no masses,  no organomegaly Extremities: extremities normal, atraumatic, no cyanosis or edema Neurologic: Alert and oriented X 3, normal strength and tone. Normal symmetric reflexes. Normal coordination and gait  ECOG PERFORMANCE STATUS: 1 - Symptomatic but completely ambulatory  Blood pressure 123/69, pulse 85, temperature 97.8 F (36.6 C), temperature source Oral, resp. rate 18, height $RemoveBe'5\' 6"'tduohJoFm$  (1.676 m), weight 228 lb 8 oz (103.647 kg), SpO2 96.00%.  LABORATORY DATA: Lab Results  Component Value Date   WBC 5.1 05/06/2014   HGB 11.7 05/06/2014   HCT 34.7* 05/06/2014   MCV 95.1 05/06/2014   PLT 176 05/06/2014      Chemistry      Component Value Date/Time   NA 139 05/06/2014 0800   NA 137 11/14/2012 0816   NA 143 07/11/2012 0927   K 3.7 05/06/2014 0800   K 3.9 11/14/2012 0816   K 4.5 07/11/2012 0927   CL 102 05/08/2013 0851   CL 101 11/14/2012 0816   CL 101 07/11/2012 0927   CO2 27 05/06/2014 0800   CO2 28 11/14/2012 0816   CO2 29 07/11/2012 0927   BUN 18.0 05/06/2014 0800   BUN 17 11/14/2012 0816   BUN 16 07/11/2012 0927   CREATININE 1.6* 05/06/2014  0800   CREATININE 1.52* 11/14/2012 0816   CREATININE 1.5* 07/11/2012 0927      Component Value Date/Time   CALCIUM 9.8 05/06/2014 0800   CALCIUM 10.2 11/14/2012 0816   CALCIUM 10.0 07/11/2012 0927   ALKPHOS 64 05/06/2014 0800   ALKPHOS 74 11/14/2012 0816   ALKPHOS 71 07/11/2012 0927   AST 14 05/06/2014 0800   AST 16 11/14/2012 0816   AST 19 07/11/2012 0927   ALT 11 05/06/2014 0800   ALT 8 11/14/2012 0816   ALT 18 07/11/2012 0927   BILITOT 1.28* 05/06/2014 0800   BILITOT 1.2 11/14/2012 0816   BILITOT 0.80 07/11/2012 0927       RADIOGRAPHIC STUDIES: Ct Chest Wo Contrast  05/06/2014   CLINICAL DATA:  Lung cancer diagnosed 3/8. Chemotherapy and radiation therapy complete. Remote history of left parotid carcinoma. Shortness of breath. Renal insufficiency.  EXAM: CT CHEST, ABDOMEN AND PELVIS WITHOUT CONTRAST  TECHNIQUE: Multidetector CT imaging of the chest, abdomen and pelvis was performed following the standard protocol without IV contrast.  COMPARISON:  02/10/2014  FINDINGS:   CT CHEST FINDINGS  Lungs/Pleura: Moderate centrilobular emphysema. Similar mass effect upon right middle and lower lobe  bronchi.  Scattered right upper lobe interstitial opacities, including on images 13 and 16, unchanged. Right upper lobe ground-glass opacity at 8 mm on image 24 is not significantly changed.  Right perihilar soft tissue fullness is similar and likely related to radiation fibrosis. No well-defined recurrent disease.  Scattered interstitial opacities are again identified on the left as well without dominant or suspicious pulmonary lesion. 3 mm left lower lobe nodule on image 31 is similar to on image 29 of the prior.  Loculated mixed attenuation right sided pleural collection is similar.  Heart/Mediastinum: No supraclavicular adenopathy. A left-sided Port-A-Cath terminates at the low SVC. Aortic and branch vessel atherosclerosis. Mild cardiomegaly with multivessel coronary artery atherosclerosis. Tiny bilateral  thyroid nodules are not significantly changed and nonspecific. No mediastinal or definite hilar adenopathy, given limitations of unenhanced CT. Small hiatal hernia.    CT ABDOMEN AND PELVIS FINDINGS  Abdomen/Pelvis: Normal noncontrast appearance of the liver, spleen, distal stomach, pancreas, gallbladder, biliary tract, adrenal glands, kidneys.  Aortic and branch vessel atherosclerosis. No retroperitoneal or retrocrural adenopathy. Colonic stool burden suggests constipation. Normal terminal ileum. Normal small bowel without abdominal ascites. No evidence of omental or peritoneal disease.  No pelvic adenopathy. Normal urinary bladder and uterus. Mild pelvic floor laxity. No adnexal mass or significant free fluid.  Bones/Musculoskeletal: No acute osseous abnormality.    IMPRESSION: CT CHEST IMPRESSION  1. No change in presumably radiation induced right perihilar and infrahilar soft tissue fullness. 2. No evidence of recurrent or metastatic disease. 3. Similar complex right-sided pleural collection. 4. Centrilobular emphysema with scattered nonspecific interstitial opacities, similar.  CT ABDOMEN AND PELVIS IMPRESSION  1. No acute process or evidence of metastatic disease in the abdomen or pelvis. 2.  Possible constipation. 3. Pelvic floor laxity.   Electronically Signed   By: Abigail Miyamoto M.D.   On: 05/06/2014 10:28   ASSESSMENT AND PLAN: This is a very pleasant 73 years old white female with metastatic non-small cell lung cancer currently on treatment with Tarceva 150 mg by mouth daily status post 21 months of treatment and tolerating it fairly well except for dry skin and grade 1-2 skin rash on the face, scalp and legs.  Her recent CT scan of the chest, abdomen and pelvis showed no evidence for disease progression. I discussed the scan results with the patient today.  I recommended for the patient to continue her current treatment with Tarceva 150 mg by mouth daily. She would come back for followup visit in  one month for reevaluation with repeat CBC and comprehensive metabolic panel. She will continue to have Port-A-Cath flush every 8 weeks. She was advised to call immediately if she has any concerning symptoms in the interval.  The patient voices understanding of current disease status and treatment options and is in agreement with the current care plan.  All questions were answered. The patient knows to call the clinic with any problems, questions or concerns. We can certainly see the patient much sooner if necessary.  Disclaimer: This note was dictated with voice recognition software. Similar sounding words can inadvertently be transcribed and may not be corrected upon review.

## 2014-05-14 ENCOUNTER — Other Ambulatory Visit: Payer: Self-pay | Admitting: Medical Oncology

## 2014-05-14 ENCOUNTER — Telehealth: Payer: Self-pay | Admitting: Medical Oncology

## 2014-05-14 DIAGNOSIS — C3490 Malignant neoplasm of unspecified part of unspecified bronchus or lung: Secondary | ICD-10-CM

## 2014-05-14 MED ORDER — ERLOTINIB HCL 150 MG PO TABS: 150.0000 mg | ORAL_TABLET | Freq: Every day | ORAL | Status: DC

## 2014-05-14 NOTE — Telephone Encounter (Signed)
Tarceva refilled via phone to [facility]

## 2014-05-14 NOTE — Telephone Encounter (Signed)
tarceva refill sent to [facility].

## 2014-05-15 NOTE — Telephone Encounter (Signed)
RECEIVED A FAX FROM [facility] CONCERNING A CONFIRMATION OF PRESCRIPTION SHIPMENT FOR [place] ON 05/14/14.

## 2014-06-12 ENCOUNTER — Telehealth: Payer: Self-pay | Admitting: *Deleted

## 2014-06-12 NOTE — Telephone Encounter (Signed)
Tarceva prescription shipped 06/11/14.

## 2014-06-16 ENCOUNTER — Other Ambulatory Visit (HOSPITAL_BASED_OUTPATIENT_CLINIC_OR_DEPARTMENT_OTHER): Payer: Medicare Other

## 2014-06-16 ENCOUNTER — Ambulatory Visit (HOSPITAL_BASED_OUTPATIENT_CLINIC_OR_DEPARTMENT_OTHER): Payer: Medicare Other | Admitting: Internal Medicine

## 2014-06-16 ENCOUNTER — Telehealth: Payer: Self-pay | Admitting: Internal Medicine

## 2014-06-16 ENCOUNTER — Encounter: Payer: Self-pay | Admitting: Internal Medicine

## 2014-06-16 VITALS — BP 122/56 | HR 85 | Temp 98.4°F | Resp 18 | Ht 66.0 in | Wt 227.6 lb

## 2014-06-16 DIAGNOSIS — R0989 Other specified symptoms and signs involving the circulatory and respiratory systems: Secondary | ICD-10-CM

## 2014-06-16 DIAGNOSIS — R5381 Other malaise: Secondary | ICD-10-CM

## 2014-06-16 DIAGNOSIS — C3490 Malignant neoplasm of unspecified part of unspecified bronchus or lung: Secondary | ICD-10-CM

## 2014-06-16 DIAGNOSIS — C3430 Malignant neoplasm of lower lobe, unspecified bronchus or lung: Secondary | ICD-10-CM

## 2014-06-16 DIAGNOSIS — R5383 Other fatigue: Secondary | ICD-10-CM

## 2014-06-16 DIAGNOSIS — R21 Rash and other nonspecific skin eruption: Secondary | ICD-10-CM

## 2014-06-16 DIAGNOSIS — R0609 Other forms of dyspnea: Secondary | ICD-10-CM

## 2014-06-16 LAB — CBC WITH DIFFERENTIAL/PLATELET
BASO%: 0.6 % (ref 0.0–2.0)
Basophils Absolute: 0 10*3/uL (ref 0.0–0.1)
EOS%: 4.5 % (ref 0.0–7.0)
Eosinophils Absolute: 0.2 10*3/uL (ref 0.0–0.5)
HCT: 35.3 % (ref 34.8–46.6)
HGB: 11.7 g/dL (ref 11.6–15.9)
LYMPH#: 0.5 10*3/uL — AB (ref 0.9–3.3)
LYMPH%: 10.1 % — ABNORMAL LOW (ref 14.0–49.7)
MCH: 31.4 pg (ref 25.1–34.0)
MCHC: 33.2 g/dL (ref 31.5–36.0)
MCV: 94.6 fL (ref 79.5–101.0)
MONO#: 0.5 10*3/uL (ref 0.1–0.9)
MONO%: 8.8 % (ref 0.0–14.0)
NEUT#: 3.9 10*3/uL (ref 1.5–6.5)
NEUT%: 76 % (ref 38.4–76.8)
Platelets: 189 10*3/uL (ref 145–400)
RBC: 3.73 10*6/uL (ref 3.70–5.45)
RDW: 13.3 % (ref 11.2–14.5)
WBC: 5.2 10*3/uL (ref 3.9–10.3)

## 2014-06-16 LAB — COMPREHENSIVE METABOLIC PANEL (CC13)
ALBUMIN: 2.8 g/dL — AB (ref 3.5–5.0)
ALK PHOS: 63 U/L (ref 40–150)
ALT: 9 U/L (ref 0–55)
AST: 13 U/L (ref 5–34)
Anion Gap: 9 mEq/L (ref 3–11)
BUN: 17.1 mg/dL (ref 7.0–26.0)
CO2: 27 meq/L (ref 22–29)
Calcium: 9.8 mg/dL (ref 8.4–10.4)
Chloride: 105 mEq/L (ref 98–109)
Creatinine: 1.5 mg/dL — ABNORMAL HIGH (ref 0.6–1.1)
Glucose: 95 mg/dl (ref 70–140)
POTASSIUM: 3.4 meq/L — AB (ref 3.5–5.1)
SODIUM: 140 meq/L (ref 136–145)
TOTAL PROTEIN: 5.8 g/dL — AB (ref 6.4–8.3)
Total Bilirubin: 1.26 mg/dL — ABNORMAL HIGH (ref 0.20–1.20)

## 2014-06-16 NOTE — Telephone Encounter (Signed)
gv adn printed appt sched and avs fo rpt for Aug..Marland Kitchenpt wanted flush same day as MD visit

## 2014-06-16 NOTE — Progress Notes (Signed)
[facility] [phone]   [phone]  OFFICE PROGRESS NOTE  [name], MD [address] [place] [address]  DIAGNOSIS: Recurrent non-small cell lung cancer, adenocarcinoma initially diagnosed as stage IIA in March 2008. The tumor cells were negative for EGFR mutation as well as ALK gene translocation.   PRIOR THERAPY:  1. Status post concurrent chemoradiation with weekly carboplatin and paclitaxel. Last dose was given Apr 12, 2007. 2. Status post 3 cycles of consolidation chemotherapy with docetaxel. Last dose was given July 12, 2007. 3. 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 4. Status post 39 cycles of maintenance Alimta at a dose of 500 mg/m2. Last dose was given February 25, 2010, discontinued secondary to persistent anemia, but the patient has stable disease.  CURRENT THERAPY: Tarceva 150 mg by mouth daily. Status post approximately 22 months of therapy.   CHEMOTHERAPY INTENT: Palliative  CURRENT # OF CHEMOTHERAPY CYCLES: 23 CURRENT ANTIEMETICS: Compazine when necessary  CURRENT SMOKING STATUS: Former smoker quit 11/27/2001  ORAL CHEMOTHERAPY AND CONSENT: Tarceva  CURRENT BISPHOSPHONATES USE: None  PAIN MANAGEMENT: 0/10  NARCOTICS INDUCED CONSTIPATION: None  LIVING WILL AND CODE STATUS: Full code initially but no prolonged resuscitation.    INTERVAL HISTORY: Diane [name] 73 y.o. female returns to the clinic today for followup visit. The patient is feeling fine today. She is tolerating her current treatment with Tarceva fairly well except for the persistent skin rash mainly on the face. She is applying clindamycin lotion in addition to several other moisture creams. She denied having any diarrhea. She has no significant fever or chills no nausea or vomiting. She has no chest pain but continues to have shortness of breath with exertion with no  hemoptysis.   MEDICAL HISTORY: Past Medical History  Diagnosis Date  . Hypertension   . Renal insufficiency     stage 3 renal disease/ pt  . lung ca dx'd 02/2010  . 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  MEDICATIONS:  Current Outpatient Prescriptions  Medication Sig Dispense Refill  . aspirin 81 MG tablet Take 81 mg by mouth daily.        . calcitRIOL (ROCALTROL) 0.25 MCG capsule Take 0.25 mcg by mouth every Monday, Wednesday, and Friday.      . clindamycin (CLEOCIN-T) 1 % external solution Apply topically 2 (two) times daily. Apply as directed  60 mL  1  . erlotinib (TARCEVA) 150 MG tablet Take 1 tablet (150 mg total) by mouth daily. Take on an empty stomach 1 hour before meals or 2 hours after.  30 tablet  1  . labetalol (NORMODYNE) 200 MG tablet Take 200 mg by mouth 2 (two) times daily.        . polyethylene glycol (MIRALAX / GLYCOLAX) packet Take 17 g by mouth daily.      Marland Kitchen torsemide (DEMADEX) 20 MG tablet Take 20 mg by mouth daily.        Marland Kitchen loperamide (IMODIUM A-D) 2 MG tablet Take 2 mg by mouth 4 (four) times daily as needed for diarrhea or loose stools.       No current facility-administered medications for this visit.   Facility-Administered Medications Ordered in Other Visits  Medication Dose Route Frequency Provider Last Rate Last Dose  . sodium chloride 0.9 % injection 10 mL  10 mL Intravenous PRN Curt Bears, MD  10 mL at 10/14/13 1030    SURGICAL HISTORY:  Past Surgical History  Procedure Laterality Date  . Portacath placement  11/03/08-[facility]    tip in mid SVC-E. Mansell    REVIEW OF SYSTEMS:  Constitutional: positive for fatigue Eyes: negative Ears, nose, mouth, throat, and face: negative Respiratory: positive for dyspnea on exertion Cardiovascular: negative Gastrointestinal: negative Genitourinary:negative Integument/breast: positive for dryness Hematologic/lymphatic:  negative Musculoskeletal:negative Neurological: negative Behavioral/Psych: negative Endocrine: negative Allergic/Immunologic: negative   PHYSICAL EXAMINATION: General appearance: alert, cooperative and no distress Head: Normocephalic, without obvious abnormality, atraumatic Neck: no adenopathy, no carotid bruit, no JVD, supple, symmetrical, trachea midline and thyroid not enlarged, symmetric, no tenderness/mass/nodules Lymph nodes: Cervical, supraclavicular, and axillary nodes normal. Resp: clear to auscultation bilaterally and normal percussion bilaterally Back: symmetric, no curvature. ROM normal. No CVA tenderness. Cardio: regular rate and rhythm, S1, S2 normal, no murmur, click, rub or gallop GI: soft, non-tender; bowel sounds normal; no masses,  no organomegaly Extremities: extremities normal, atraumatic, no cyanosis or edema Neurologic: Alert and oriented X 3, normal strength and tone. Normal symmetric reflexes. Normal coordination and gait  ECOG PERFORMANCE STATUS: 1 - Symptomatic but completely ambulatory  Blood pressure 122/56, pulse 85, temperature 98.4 F (36.9 C), temperature source Oral, resp. rate 18, height $RemoveBe'5\' 6"'JfxVPdxVP$  (1.676 m), weight 227 lb 9.6 oz (103.239 kg), SpO2 98.00%.  LABORATORY DATA: Lab Results  Component Value Date   WBC 5.2 06/16/2014   HGB 11.7 06/16/2014   HCT 35.3 06/16/2014   MCV 94.6 06/16/2014   PLT 189 06/16/2014      Chemistry      Component Value Date/Time   NA 140 06/16/2014 0834   NA 137 11/14/2012 0816   NA 143 07/11/2012 0927   K 3.4* 06/16/2014 0834   K 3.9 11/14/2012 0816   K 4.5 07/11/2012 0927   CL 102 05/08/2013 0851   CL 101 11/14/2012 0816   CL 101 07/11/2012 0927   CO2 27 06/16/2014 0834   CO2 28 11/14/2012 0816   CO2 29 07/11/2012 0927   BUN 17.1 06/16/2014 0834   BUN 17 11/14/2012 0816   BUN 16 07/11/2012 0927   CREATININE 1.5* 06/16/2014 0834   CREATININE 1.52* 11/14/2012 0816   CREATININE 1.5* 07/11/2012 0927      Component Value  Date/Time   CALCIUM 9.8 06/16/2014 0834   CALCIUM 10.2 11/14/2012 0816   CALCIUM 10.0 07/11/2012 0927   ALKPHOS 63 06/16/2014 0834   ALKPHOS 74 11/14/2012 0816   ALKPHOS 71 07/11/2012 0927   AST 13 06/16/2014 0834   AST 16 11/14/2012 0816   AST 19 07/11/2012 0927   ALT 9 06/16/2014 0834   ALT 8 11/14/2012 0816   ALT 18 07/11/2012 0927   BILITOT 1.26* 06/16/2014 0834   BILITOT 1.2 11/14/2012 0816   BILITOT 0.80 07/11/2012 0927       RADIOGRAPHIC STUDIES:  ASSESSMENT AND PLAN: This is a very pleasant 73 years old white female with metastatic non-small cell lung cancer currently on treatment with Tarceva 150 mg by mouth daily status post 21 months of treatment and tolerating it fairly well except for dry skin and grade 1-2 skin rash on the face.  I recommended for the patient to continue her current treatment with Tarceva 150 mg by mouth daily. She would come back for followup visit in one month for reevaluation with repeat CBC and comprehensive metabolic panel. She will continue to have Port-A-Cath flush every 8 weeks. She was advised to call  immediately if she has any concerning symptoms in the interval.  The patient voices understanding of current disease status and treatment options and is in agreement with the current care plan.  All questions were answered. The patient knows to call the clinic with any problems, questions or concerns. We can certainly see the patient much sooner if necessary.  Disclaimer: This note was dictated with voice recognition software. Similar sounding words can inadvertently be transcribed and may not be corrected upon review.

## 2014-07-07 ENCOUNTER — Other Ambulatory Visit: Payer: Self-pay | Admitting: *Deleted

## 2014-07-07 NOTE — Telephone Encounter (Signed)
THIS REFILL REQUEST FOR TARCEVA WAS GIVEN TO DR.MOHAMED'S NURSE, DIANE BELL,RN.

## 2014-07-08 ENCOUNTER — Other Ambulatory Visit: Payer: Self-pay | Admitting: Medical Oncology

## 2014-07-08 DIAGNOSIS — C3490 Malignant neoplasm of unspecified part of unspecified bronchus or lung: Secondary | ICD-10-CM

## 2014-07-08 MED ORDER — ERLOTINIB HCL 150 MG PO TABS: 150.0000 mg | ORAL_TABLET | Freq: Every day | ORAL | Status: DC

## 2014-07-08 NOTE — Telephone Encounter (Signed)
tarceva refill sent to [facility] for auth.

## 2014-07-10 ENCOUNTER — Other Ambulatory Visit: Payer: Self-pay | Admitting: Medical Oncology

## 2014-07-10 DIAGNOSIS — C3490 Malignant neoplasm of unspecified part of unspecified bronchus or lung: Secondary | ICD-10-CM

## 2014-07-14 ENCOUNTER — Telehealth: Payer: Self-pay | Admitting: Internal Medicine

## 2014-07-14 ENCOUNTER — Encounter: Payer: Self-pay | Admitting: Internal Medicine

## 2014-07-14 ENCOUNTER — Ambulatory Visit (HOSPITAL_BASED_OUTPATIENT_CLINIC_OR_DEPARTMENT_OTHER): Payer: Medicare Other

## 2014-07-14 ENCOUNTER — Ambulatory Visit (HOSPITAL_BASED_OUTPATIENT_CLINIC_OR_DEPARTMENT_OTHER): Payer: Medicare Other | Admitting: Internal Medicine

## 2014-07-14 ENCOUNTER — Other Ambulatory Visit (HOSPITAL_BASED_OUTPATIENT_CLINIC_OR_DEPARTMENT_OTHER): Payer: Medicare Other

## 2014-07-14 VITALS — BP 122/59 | HR 90 | Temp 98.2°F | Resp 18 | Ht 66.0 in | Wt 229.3 lb

## 2014-07-14 DIAGNOSIS — R21 Rash and other nonspecific skin eruption: Secondary | ICD-10-CM

## 2014-07-14 DIAGNOSIS — C3430 Malignant neoplasm of lower lobe, unspecified bronchus or lung: Secondary | ICD-10-CM

## 2014-07-14 DIAGNOSIS — C3490 Malignant neoplasm of unspecified part of unspecified bronchus or lung: Secondary | ICD-10-CM

## 2014-07-14 DIAGNOSIS — Z95828 Presence of other vascular implants and grafts: Secondary | ICD-10-CM

## 2014-07-14 LAB — COMPREHENSIVE METABOLIC PANEL (CC13)
ALK PHOS: 54 U/L (ref 40–150)
ALT: 9 U/L (ref 0–55)
AST: 14 U/L (ref 5–34)
Albumin: 2.6 g/dL — ABNORMAL LOW (ref 3.5–5.0)
Anion Gap: 7 mEq/L (ref 3–11)
BILIRUBIN TOTAL: 1.12 mg/dL (ref 0.20–1.20)
BUN: 15.4 mg/dL (ref 7.0–26.0)
CO2: 24 mEq/L (ref 22–29)
Calcium: 9.4 mg/dL (ref 8.4–10.4)
Chloride: 109 mEq/L (ref 98–109)
Creatinine: 1.3 mg/dL — ABNORMAL HIGH (ref 0.6–1.1)
Glucose: 96 mg/dl (ref 70–140)
Potassium: 4 mEq/L (ref 3.5–5.1)
Sodium: 140 mEq/L (ref 136–145)
TOTAL PROTEIN: 5.4 g/dL — AB (ref 6.4–8.3)

## 2014-07-14 LAB — CBC WITH DIFFERENTIAL/PLATELET
BASO%: 0.8 % (ref 0.0–2.0)
Basophils Absolute: 0 10*3/uL (ref 0.0–0.1)
EOS%: 6.1 % (ref 0.0–7.0)
Eosinophils Absolute: 0.3 10*3/uL (ref 0.0–0.5)
HCT: 34.1 % — ABNORMAL LOW (ref 34.8–46.6)
HGB: 11.1 g/dL — ABNORMAL LOW (ref 11.6–15.9)
LYMPH%: 10.5 % — AB (ref 14.0–49.7)
MCH: 31.4 pg (ref 25.1–34.0)
MCHC: 32.6 g/dL (ref 31.5–36.0)
MCV: 96.3 fL (ref 79.5–101.0)
MONO#: 0.5 10*3/uL (ref 0.1–0.9)
MONO%: 10.9 % (ref 0.0–14.0)
NEUT#: 3 10*3/uL (ref 1.5–6.5)
NEUT%: 71.7 % (ref 38.4–76.8)
PLATELETS: 156 10*3/uL (ref 145–400)
RBC: 3.55 10*6/uL — AB (ref 3.70–5.45)
RDW: 14.6 % — ABNORMAL HIGH (ref 11.2–14.5)
WBC: 4.2 10*3/uL (ref 3.9–10.3)
lymph#: 0.4 10*3/uL — ABNORMAL LOW (ref 0.9–3.3)

## 2014-07-14 MED ORDER — DOXYCYCLINE HYCLATE 100 MG PO TABS: 100.0000 mg | ORAL_TABLET | Freq: Two times a day (BID) | ORAL | Status: DC

## 2014-07-14 MED ORDER — SODIUM CHLORIDE 0.9 % IJ SOLN
10.0000 mL | INTRAMUSCULAR | Status: DC | PRN
  Administered 2014-07-14: 10 mL via INTRAVENOUS
  Filled 2014-07-14: qty 10

## 2014-07-14 MED ORDER — HEPARIN SOD (PORK) LOCK FLUSH 100 UNIT/ML IV SOLN
500.0000 [IU] | Freq: Once | INTRAVENOUS | Status: AC
  Administered 2014-07-14: 500 [IU] via INTRAVENOUS
  Filled 2014-07-14: qty 5

## 2014-07-14 NOTE — Progress Notes (Signed)
[facility] Telephone:([phone]   Fax:([phone]  OFFICE PROGRESS NOTE  [name], MD [address] [place] [address]  DIAGNOSIS: Recurrent non-small cell lung cancer, adenocarcinoma initially diagnosed as stage IIA in March 2008. The tumor cells were negative for EGFR mutation as well as ALK gene translocation.   PRIOR THERAPY:  1. Status post concurrent chemoradiation with weekly carboplatin and paclitaxel. Last dose was given Apr 12, 2007. 2. Status post 3 cycles of consolidation chemotherapy with docetaxel. Last dose was given July 12, 2007. 3. 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 4. Status post 39 cycles of maintenance Alimta at a dose of 500 mg/m2. Last dose was given February 25, 2010, discontinued secondary to persistent anemia, but the patient has stable disease.  CURRENT THERAPY: Tarceva 150 mg by mouth daily. Status post approximately 23 months of therapy.   CHEMOTHERAPY INTENT: Palliative  CURRENT # OF CHEMOTHERAPY CYCLES: 24 CURRENT ANTIEMETICS: Compazine when necessary  CURRENT SMOKING STATUS: Former smoker quit 11/27/2001  ORAL CHEMOTHERAPY AND CONSENT: Tarceva  CURRENT BISPHOSPHONATES USE: None  PAIN MANAGEMENT: 0/10  NARCOTICS INDUCED CONSTIPATION: None  LIVING WILL AND CODE STATUS: Full code initially but no prolonged resuscitation.    INTERVAL HISTORY: Diane [name] 73 y.o. female returns to the clinic today for routine monthly followup visit. The patient is feeling fine today. She is tolerating her current treatment with Tarceva fairly well except for the persistent skin rash mainly on the face and scalp. She was seen by her primary care physician and was started on Septra but this made her very sick and she stopped the medication.  Her face is better but she continues to have several scalp lesions. She denied having any diarrhea. She has no  significant fever or chills no nausea or vomiting. She has no chest pain but continues to have shortness of breath with exertion with no hemoptysis.   MEDICAL HISTORY: Past Medical History  Diagnosis Date  . Hypertension   . Renal insufficiency     stage 3 renal disease/ pt  . lung ca dx'd 02/2010  . 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  MEDICATIONS:  Current Outpatient Prescriptions  Medication Sig Dispense Refill  . aspirin 81 MG tablet Take 81 mg by mouth daily.        . calcitRIOL (ROCALTROL) 0.25 MCG capsule Take 0.25 mcg by mouth every Monday, Wednesday, and Friday.      . clindamycin (CLEOCIN-T) 1 % external solution Apply topically 2 (two) times daily. Apply as directed  60 mL  1  . erlotinib (TARCEVA) 150 MG tablet Take 1 tablet (150 mg total) by mouth daily. Take on an empty stomach 1 hour before meals or 2 hours after.  30 tablet  1  . labetalol (NORMODYNE) 200 MG tablet Take 200 mg by mouth 2 (two) times daily.        . polyethylene glycol (MIRALAX / GLYCOLAX) packet Take 17 g by mouth daily.      Marland Kitchen torsemide (DEMADEX) 20 MG tablet Take 20 mg by mouth daily.         No current facility-administered medications for this visit.   Facility-Administered Medications Ordered in Other Visits  Medication Dose Route Frequency Provider Last Rate Last Dose  . sodium chloride 0.9 % injection 10 mL  10 mL Intravenous PRN Curt Bears,  MD   10 mL at 10/14/13 1030    SURGICAL HISTORY:  Past Surgical History  Procedure Laterality Date  . Portacath placement  11/03/08-[facility]    tip in mid SVC-E. Mansell    REVIEW OF SYSTEMS:  Constitutional: positive for fatigue Eyes: negative Ears, nose, mouth, throat, and face: negative Respiratory: positive for dyspnea on exertion Cardiovascular: negative Gastrointestinal: negative Genitourinary:negative Integument/breast: positive for dryness Hematologic/lymphatic:  negative Musculoskeletal:negative Neurological: negative Behavioral/Psych: negative Endocrine: negative Allergic/Immunologic: negative   PHYSICAL EXAMINATION: General appearance: alert, cooperative and no distress Head: Normocephalic, without obvious abnormality, atraumatic Neck: no adenopathy, no carotid bruit, no JVD, supple, symmetrical, trachea midline and thyroid not enlarged, symmetric, no tenderness/mass/nodules Lymph nodes: Cervical, supraclavicular, and axillary nodes normal. Resp: clear to auscultation bilaterally and normal percussion bilaterally Back: symmetric, no curvature. ROM normal. No CVA tenderness. Cardio: regular rate and rhythm, S1, S2 normal, no murmur, click, rub or gallop GI: soft, non-tender; bowel sounds normal; no masses,  no organomegaly Extremities: extremities normal, atraumatic, no cyanosis or edema Neurologic: Alert and oriented X 3, normal strength and tone. Normal symmetric reflexes. Normal coordination and gait  ECOG PERFORMANCE STATUS: 1 - Symptomatic but completely ambulatory  There were no vitals taken for this visit.  LABORATORY DATA: Lab Results  Component Value Date   WBC 4.2 07/14/2014   HGB 11.1* 07/14/2014   HCT 34.1* 07/14/2014   MCV 96.3 07/14/2014   PLT 156 07/14/2014      Chemistry      Component Value Date/Time   NA 140 07/14/2014 0815   NA 137 11/14/2012 0816   NA 143 07/11/2012 0927   K 4.0 07/14/2014 0815   K 3.9 11/14/2012 0816   K 4.5 07/11/2012 0927   CL 102 05/08/2013 0851   CL 101 11/14/2012 0816   CL 101 07/11/2012 0927   CO2 24 07/14/2014 0815   CO2 28 11/14/2012 0816   CO2 29 07/11/2012 0927   BUN 15.4 07/14/2014 0815   BUN 17 11/14/2012 0816   BUN 16 07/11/2012 0927   CREATININE 1.3* 07/14/2014 0815   CREATININE 1.52* 11/14/2012 0816   CREATININE 1.5* 07/11/2012 0927      Component Value Date/Time   CALCIUM 9.4 07/14/2014 0815   CALCIUM 10.2 11/14/2012 0816   CALCIUM 10.0 07/11/2012 0927   ALKPHOS 54 07/14/2014 0815    ALKPHOS 74 11/14/2012 0816   ALKPHOS 71 07/11/2012 0927   AST 14 07/14/2014 0815   AST 16 11/14/2012 0816   AST 19 07/11/2012 0927   ALT 9 07/14/2014 0815   ALT 8 11/14/2012 0816   ALT 18 07/11/2012 0927   BILITOT 1.12 07/14/2014 0815   BILITOT 1.2 11/14/2012 0816   BILITOT 0.80 07/11/2012 0927       RADIOGRAPHIC STUDIES:  ASSESSMENT AND PLAN: This is a very pleasant 73 years old white female with metastatic non-small cell lung cancer currently on treatment with Tarceva 150 mg by mouth daily status post 23 months of treatment and tolerating it fairly well except for dry skin and grade 1-2 skin rash on the face and scalp. I will start the patient on doxycycline 100 mg by mouth twice a day for 10 days. I recommended for the patient to continue her current treatment with Tarceva 150 mg by mouth daily. She would come back for followup visit in one month for reevaluation with repeat CBC and comprehensive metabolic panel as well as CT scan of the chest, abdomen and pelvis for restaging of her disease.  She will continue to have Port-A-Cath flush every 8 weeks. She was advised to call immediately if she has any concerning symptoms in the interval.  The patient voices understanding of current disease status and treatment options and is in agreement with the current care plan.  All questions were answered. The patient knows to call the clinic with any problems, questions or concerns. We can certainly see the patient much sooner if necessary.  Disclaimer: This note was dictated with voice recognition software. Similar sounding words can inadvertently be transcribed and may not be corrected upon review.

## 2014-07-14 NOTE — Addendum Note (Signed)
Addended by: Curt Bears on: 07/14/2014 09:39 AM   Modules accepted: Orders

## 2014-07-14 NOTE — Patient Instructions (Signed)

## 2014-07-14 NOTE — Telephone Encounter (Deleted)
Pt confirmed labs/ov per 08/18 POF, gave pt AVS...Marland KitchenMarland KitchenKJ

## 2014-07-14 NOTE — Telephone Encounter (Signed)
Pt confirmed labs/ov per 08/18 POF, gave pt AVS...Marland KitchenMarland KitchenKJ Pt came over complaining that she was sitting to long and that the pt across needs to leave that she had to wait to long with all the conversations, but she didn't wait long I apologize and got her scheduled in.Marland Kitchen..KJ

## 2014-08-07 ENCOUNTER — Other Ambulatory Visit: Payer: Self-pay | Admitting: *Deleted

## 2014-08-07 DIAGNOSIS — C3490 Malignant neoplasm of unspecified part of unspecified bronchus or lung: Secondary | ICD-10-CM

## 2014-08-10 ENCOUNTER — Other Ambulatory Visit (HOSPITAL_COMMUNITY): Payer: Medicare Other

## 2014-08-10 ENCOUNTER — Ambulatory Visit (HOSPITAL_COMMUNITY): Admission: RE | Admit: 2014-08-10 | Payer: Medicare Other | Source: Ambulatory Visit

## 2014-08-10 ENCOUNTER — Other Ambulatory Visit (HOSPITAL_BASED_OUTPATIENT_CLINIC_OR_DEPARTMENT_OTHER): Payer: Medicare Other

## 2014-08-10 ENCOUNTER — Ambulatory Visit (HOSPITAL_COMMUNITY)
Admission: RE | Admit: 2014-08-10 | Discharge: 2014-08-10 | Disposition: A | Discharge status: Home / Self Care | Payer: Medicare Other | Source: Ambulatory Visit | Attending: Internal Medicine | Admitting: Internal Medicine

## 2014-08-10 ENCOUNTER — Ambulatory Visit (HOSPITAL_COMMUNITY): Payer: Medicare Other

## 2014-08-10 DIAGNOSIS — R059 Cough, unspecified: Secondary | ICD-10-CM | POA: Diagnosis not present

## 2014-08-10 DIAGNOSIS — J438 Other emphysema: Secondary | ICD-10-CM | POA: Insufficient documentation

## 2014-08-10 DIAGNOSIS — Z79899 Other long term (current) drug therapy: Secondary | ICD-10-CM | POA: Diagnosis not present

## 2014-08-10 DIAGNOSIS — R918 Other nonspecific abnormal finding of lung field: Secondary | ICD-10-CM | POA: Diagnosis not present

## 2014-08-10 DIAGNOSIS — C3430 Malignant neoplasm of lower lobe, unspecified bronchus or lung: Secondary | ICD-10-CM

## 2014-08-10 DIAGNOSIS — C3490 Malignant neoplasm of unspecified part of unspecified bronchus or lung: Secondary | ICD-10-CM

## 2014-08-10 DIAGNOSIS — R05 Cough: Secondary | ICD-10-CM | POA: Insufficient documentation

## 2014-08-10 DIAGNOSIS — R0602 Shortness of breath: Secondary | ICD-10-CM | POA: Diagnosis not present

## 2014-08-10 DIAGNOSIS — Z923 Personal history of irradiation: Secondary | ICD-10-CM | POA: Insufficient documentation

## 2014-08-10 LAB — COMPREHENSIVE METABOLIC PANEL (CC13)
ALT: 8 U/L (ref 0–55)
ANION GAP: 12 meq/L — AB (ref 3–11)
AST: 16 U/L (ref 5–34)
Albumin: 3 g/dL — ABNORMAL LOW (ref 3.5–5.0)
Alkaline Phosphatase: 69 U/L (ref 40–150)
BUN: 17.4 mg/dL (ref 7.0–26.0)
CO2: 26 meq/L (ref 22–29)
CREATININE: 1.5 mg/dL — AB (ref 0.6–1.1)
Calcium: 10.1 mg/dL (ref 8.4–10.4)
Chloride: 101 mEq/L (ref 98–109)
Glucose: 92 mg/dl (ref 70–140)
Potassium: 4 mEq/L (ref 3.5–5.1)
Sodium: 139 mEq/L (ref 136–145)
Total Bilirubin: 1.45 mg/dL — ABNORMAL HIGH (ref 0.20–1.20)
Total Protein: 6.4 g/dL (ref 6.4–8.3)

## 2014-08-10 LAB — CBC WITH DIFFERENTIAL/PLATELET
BASO%: 0.9 % (ref 0.0–2.0)
Basophils Absolute: 0.1 10*3/uL (ref 0.0–0.1)
EOS%: 4.8 % (ref 0.0–7.0)
Eosinophils Absolute: 0.3 10*3/uL (ref 0.0–0.5)
HEMATOCRIT: 36.6 % (ref 34.8–46.6)
HGB: 11.9 g/dL (ref 11.6–15.9)
LYMPH%: 9.2 % — AB (ref 14.0–49.7)
MCH: 31.2 pg (ref 25.1–34.0)
MCHC: 32.6 g/dL (ref 31.5–36.0)
MCV: 95.7 fL (ref 79.5–101.0)
MONO#: 0.5 10*3/uL (ref 0.1–0.9)
MONO%: 8.5 % (ref 0.0–14.0)
NEUT#: 4.2 10*3/uL (ref 1.5–6.5)
NEUT%: 76.6 % (ref 38.4–76.8)
Platelets: 207 10*3/uL (ref 145–400)
RBC: 3.83 10*6/uL (ref 3.70–5.45)
RDW: 14.8 % — ABNORMAL HIGH (ref 11.2–14.5)
WBC: 5.5 10*3/uL (ref 3.9–10.3)
lymph#: 0.5 10*3/uL — ABNORMAL LOW (ref 0.9–3.3)

## 2014-08-10 MED ORDER — IOHEXOL 300 MG/ML  SOLN
50.0000 mL | Freq: Once | INTRAMUSCULAR | Status: AC | PRN
  Administered 2014-08-10: 50 mL via ORAL

## 2014-08-12 ENCOUNTER — Encounter: Payer: Self-pay | Admitting: Internal Medicine

## 2014-08-12 ENCOUNTER — Telehealth: Payer: Self-pay | Admitting: Internal Medicine

## 2014-08-12 ENCOUNTER — Ambulatory Visit (HOSPITAL_BASED_OUTPATIENT_CLINIC_OR_DEPARTMENT_OTHER): Payer: Medicare Other | Admitting: Internal Medicine

## 2014-08-12 VITALS — BP 106/49 | HR 84 | Temp 98.1°F | Resp 18 | Ht 66.0 in | Wt 230.0 lb

## 2014-08-12 DIAGNOSIS — C3490 Malignant neoplasm of unspecified part of unspecified bronchus or lung: Secondary | ICD-10-CM

## 2014-08-12 DIAGNOSIS — R0609 Other forms of dyspnea: Secondary | ICD-10-CM

## 2014-08-12 DIAGNOSIS — R0989 Other specified symptoms and signs involving the circulatory and respiratory systems: Secondary | ICD-10-CM

## 2014-08-12 DIAGNOSIS — R5381 Other malaise: Secondary | ICD-10-CM

## 2014-08-12 DIAGNOSIS — R21 Rash and other nonspecific skin eruption: Secondary | ICD-10-CM

## 2014-08-12 DIAGNOSIS — C3430 Malignant neoplasm of lower lobe, unspecified bronchus or lung: Secondary | ICD-10-CM

## 2014-08-12 DIAGNOSIS — R5383 Other fatigue: Secondary | ICD-10-CM

## 2014-08-12 NOTE — Telephone Encounter (Signed)
gva dn pritned appt sched and avs for pt for OCT

## 2014-08-12 NOTE — Progress Notes (Signed)
[place] Telephone:(336) [phone]   Fax:(336) [phone]  OFFICE PROGRESS NOTE  [name], MD [address] [place] [address]  DIAGNOSIS: Recurrent non-small cell lung cancer, adenocarcinoma initially diagnosed as stage IIA in March 2008. The tumor cells were negative for EGFR mutation as well as ALK gene translocation.   PRIOR THERAPY:  1. Status post concurrent chemoradiation with weekly carboplatin and paclitaxel. Last dose was given Apr 12, 2007. 2. Status post 3 cycles of consolidation chemotherapy with docetaxel. Last dose was given July 12, 2007. 3. 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 4. Status post 39 cycles of maintenance Alimta at a dose of 500 mg/m2. Last dose was given February 25, 2010, discontinued secondary to persistent anemia, but the patient has stable disease.  CURRENT THERAPY: Tarceva 150 mg by mouth daily. Status post approximately 24 months of therapy.   CHEMOTHERAPY INTENT: Palliative  CURRENT # OF CHEMOTHERAPY CYCLES: 25 CURRENT ANTIEMETICS: Compazine when necessary  CURRENT SMOKING STATUS: Former smoker quit 11/27/2001  ORAL CHEMOTHERAPY AND CONSENT: Tarceva  CURRENT BISPHOSPHONATES USE: None  PAIN MANAGEMENT: 0/10  NARCOTICS INDUCED CONSTIPATION: None  LIVING WILL AND CODE STATUS: Full code initially but no prolonged resuscitation.    INTERVAL HISTORY: Diane [name] 73 y.o. female returns to the clinic today for routine monthly followup visit. The patient is feeling fine today. She is tolerating her current treatment with Tarceva fairly well except for the persistent skin rash mainly on the face and scalp. She denied having any diarrhea. She has no significant fever or chills no nausea or vomiting. She has no chest pain but continues to have shortness of breath with exertion with no hemoptysis. She had repeat CT scan of the chest, abdomen and  pelvis performed recently and she is here for evaluation and discussion of her scan results.  MEDICAL HISTORY: Past Medical History  Diagnosis Date  . Hypertension   . Renal insufficiency     stage 3 renal disease/ pt  . lung ca dx'd 02/2010  . 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  MEDICATIONS:  Current Outpatient Prescriptions  Medication Sig Dispense Refill  . aspirin 81 MG tablet Take 81 mg by mouth daily.        . calcitRIOL (ROCALTROL) 0.25 MCG capsule Take 0.25 mcg by mouth every Monday, Wednesday, and Friday.      . clindamycin (CLEOCIN-T) 1 % external solution Apply topically 2 (two) times daily. Apply as directed  60 mL  1  . doxycycline (VIBRA-TABS) 100 MG tablet Take 1 tablet (100 mg total) by mouth 2 (two) times daily.  20 tablet  0  . erlotinib (TARCEVA) 150 MG tablet Take 1 tablet (150 mg total) by mouth daily. Take on an empty stomach 1 hour before meals or 2 hours after.  30 tablet  1  . labetalol (NORMODYNE) 200 MG tablet Take 200 mg by mouth 2 (two) times daily.        Marland Kitchen neomycin-polymyxin b-dexamethasone (MAXITROL) 3.5-10000-0.1 SUSP       . polyethylene glycol (MIRALAX / GLYCOLAX) packet Take 17 g by mouth daily.      Marland Kitchen torsemide (DEMADEX) 20 MG tablet Take 20 mg by mouth daily.         No current facility-administered medications for this visit.   Facility-Administered Medications Ordered in Other Visits  Medication Dose  Route Frequency Provider Last Rate Last Dose  . sodium chloride 0.9 % injection 10 mL  10 mL Intravenous PRN Curt Bears, MD   10 mL at 10/14/13 1030    SURGICAL HISTORY:  Past Surgical History  Procedure Laterality Date  . Portacath placement  11/03/08-[facility]    tip in mid SVC-E. Mansell    REVIEW OF SYSTEMS:  Constitutional: positive for fatigue Eyes: negative Ears, nose, mouth, throat, and face: negative Respiratory: positive for dyspnea on exertion Cardiovascular:  negative Gastrointestinal: negative Genitourinary:negative Integument/breast: positive for dryness Hematologic/lymphatic: negative Musculoskeletal:negative Neurological: negative Behavioral/Psych: negative Endocrine: negative Allergic/Immunologic: negative   PHYSICAL EXAMINATION: General appearance: alert, cooperative and no distress Head: Normocephalic, without obvious abnormality, atraumatic Neck: no adenopathy, no carotid bruit, no JVD, supple, symmetrical, trachea midline and thyroid not enlarged, symmetric, no tenderness/mass/nodules Lymph nodes: Cervical, supraclavicular, and axillary nodes normal. Resp: clear to auscultation bilaterally and normal percussion bilaterally Back: symmetric, no curvature. ROM normal. No CVA tenderness. Cardio: regular rate and rhythm, S1, S2 normal, no murmur, click, rub or gallop GI: soft, non-tender; bowel sounds normal; no masses,  no organomegaly Extremities: extremities normal, atraumatic, no cyanosis or edema Neurologic: Alert and oriented X 3, normal strength and tone. Normal symmetric reflexes. Normal coordination and gait  ECOG PERFORMANCE STATUS: 1 - Symptomatic but completely ambulatory  Blood pressure 106/49, pulse 84, temperature 98.1 F (36.7 C), temperature source Oral, resp. rate 18, height $RemoveBe'5\' 6"'[id number]$  (1.676 m), weight 230 lb (104.327 kg).  LABORATORY DATA: Lab Results  Component Value Date   WBC 5.5 08/10/2014   HGB 11.9 08/10/2014   HCT 36.6 08/10/2014   MCV 95.7 08/10/2014   PLT 207 08/10/2014      Chemistry      Component Value Date/Time   NA 139 08/10/2014 0813   NA 137 11/14/2012 0816   NA 143 07/11/2012 0927   K 4.0 08/10/2014 0813   K 3.9 11/14/2012 0816   K 4.5 07/11/2012 0927   CL 102 05/08/2013 0851   CL 101 11/14/2012 0816   CL 101 07/11/2012 0927   CO2 26 08/10/2014 0813   CO2 28 11/14/2012 0816   CO2 29 07/11/2012 0927   BUN 17.4 08/10/2014 0813   BUN 17 11/14/2012 0816   BUN 16 07/11/2012 0927   CREATININE 1.5*  08/10/2014 0813   CREATININE 1.52* 11/14/2012 0816   CREATININE 1.5* 07/11/2012 0927      Component Value Date/Time   CALCIUM 10.1 08/10/2014 0813   CALCIUM 10.2 11/14/2012 0816   CALCIUM 10.0 07/11/2012 0927   ALKPHOS 69 08/10/2014 0813   ALKPHOS 74 11/14/2012 0816   ALKPHOS 71 07/11/2012 0927   AST 16 08/10/2014 0813   AST 16 11/14/2012 0816   AST 19 07/11/2012 0927   ALT 8 08/10/2014 0813   ALT 8 11/14/2012 0816   ALT 18 07/11/2012 0927   BILITOT 1.45* 08/10/2014 0813   BILITOT 1.2 11/14/2012 0816   BILITOT 0.80 07/11/2012 0927       RADIOGRAPHIC STUDIES: Ct Abdomen Pelvis Wo Contrast  08/10/2014   CLINICAL DATA:  Followup metastatic lung carcinoma. Cough and shortness of breath. Currently undergoing oral chemotherapy. Previous chemotherapy and radiation therapy.  EXAM: CT CHEST, ABDOMEN, AND PELVIS WITH CONTRAST  TECHNIQUE: Multidetector CT imaging of the chest, abdomen and pelvis was performed following the standard protocol during bolus administration of intravenous contrast.  CONTRAST:  05/06/2014  COMPARISON:  05/06/2014  FINDINGS: CT CHEST FINDINGS  Mediastinum/Hilar Regions: No masses identified in this  noncontrast study.  Other Thoracic Lymphadenopathy:  None.  Lungs: Presumed radiation changes in the right perihilar and infrahilar regions are stable in appearance. Scattered ill-defined nodular densities are seen in the right lung which remains stable. Index nodule is ill-defined with ground-glass attenuation measuring 8 mm in the anterior right lung on image 22. Mild emphysema is also stable  Pleura: Mixed attenuation right sided pleural collection is stable in appearance.  Vascular/Cardiac: No thoracic aortic aneurysm or other significant abnormality identified.  Musculoskeletal:  No suspicious bone lesions identified.  Other:  None.  CT ABDOMEN AND PELVIS FINDINGS  Liver: No mass or other abnormality visualized on this non-contrast exam.  Gallbladder/Biliary:  Unremarkable.  Pancreas: No  mass or inflammatory process visualized on this non-contrast exam.  Spleen:  Within normal limits in size.  Adrenal Glands:  No mass identified.  Kidneys/Urinary tract: No evidence of urolithiasis or hydronephrosis.  Lymph Nodes:  No pathologically enlarged lymph nodes identified.  Pelvic/Reproductive Organs: No mass or other significant abnormality noted.  Bowel/Peritoneum:  Unremarkable.  Vascular:  No evidence of abdominal aortic aneurysm.  Musculoskeletal:  No suspicious bone lesions identified.  Other:  None.  IMPRESSION: Stable post treatment changes in right hemithorax, and scattered right lung nodules.  Stable complex mixed attenuation right-sided pleural collection.  No definite evidence of recurrent or metastatic carcinoma within the chest, abdomen, or pelvis.   Electronically Signed   By: Earle Gell M.D.   On: 08/10/2014 10:06   Ct Chest Wo Contrast  08/10/2014   CLINICAL DATA:  Followup metastatic lung carcinoma. Cough and shortness of breath. Currently undergoing oral chemotherapy. Previous chemotherapy and radiation therapy.  EXAM: CT CHEST, ABDOMEN, AND PELVIS WITH CONTRAST  TECHNIQUE: Multidetector CT imaging of the chest, abdomen and pelvis was performed following the standard protocol during bolus administration of intravenous contrast.  CONTRAST:  05/06/2014  COMPARISON:  05/06/2014  FINDINGS: CT CHEST FINDINGS  Mediastinum/Hilar Regions: No masses identified in this noncontrast study.  Other Thoracic Lymphadenopathy:  None.  Lungs: Presumed radiation changes in the right perihilar and infrahilar regions are stable in appearance. Scattered ill-defined nodular densities are seen in the right lung which remains stable. Index nodule is ill-defined with ground-glass attenuation measuring 8 mm in the anterior right lung on image 22. Mild emphysema is also stable  Pleura: Mixed attenuation right sided pleural collection is stable in appearance.  Vascular/Cardiac: No thoracic aortic aneurysm or other  significant abnormality identified.  Musculoskeletal:  No suspicious bone lesions identified.  Other:  None.  CT ABDOMEN AND PELVIS FINDINGS  Liver: No mass or other abnormality visualized on this non-contrast exam.  Gallbladder/Biliary:  Unremarkable.  Pancreas: No mass or inflammatory process visualized on this non-contrast exam.  Spleen:  Within normal limits in size.  Adrenal Glands:  No mass identified.  Kidneys/Urinary tract: No evidence of urolithiasis or hydronephrosis.  Lymph Nodes:  No pathologically enlarged lymph nodes identified.  Pelvic/Reproductive Organs: No mass or other significant abnormality noted.  Bowel/Peritoneum:  Unremarkable.  Vascular:  No evidence of abdominal aortic aneurysm.  Musculoskeletal:  No suspicious bone lesions identified.  Other:  None.  IMPRESSION: Stable post treatment changes in right hemithorax, and scattered right lung nodules.  Stable complex mixed attenuation right-sided pleural collection.  No definite evidence of recurrent or metastatic carcinoma within the chest, abdomen, or pelvis.   Electronically Signed   By: Earle Gell M.D.   On: 08/10/2014 10:06   ASSESSMENT AND PLAN: This is a very pleasant 73 years  old white female with metastatic non-small cell lung cancer currently on treatment with Tarceva 150 mg by mouth daily status post 23 months of treatment and tolerating it fairly well except for dry skin and grade 1-2 skin rash on the face and scalp. Her recent CT scan of the chest, abdomen and pelvis showed stable disease with no significant evidence for progression. I discussed the scan results with the patient today. I recommended for the patient to continue her current treatment with Tarceva 150 mg by mouth daily. She would come back for followup visit in one month for reevaluation with repeat CBC and comprehensive metabolic panel. She will continue to have Port-A-Cath flush every 8 weeks. She was advised to call immediately if she has any concerning  symptoms in the interval.  The patient voices understanding of current disease status and treatment options and is in agreement with the current care plan.  All questions were answered. The patient knows to call the clinic with any problems, questions or concerns. We can certainly see the patient much sooner if necessary.  Disclaimer: This note was dictated with voice recognition software. Similar sounding words can inadvertently be transcribed and may not be corrected upon review.

## 2014-08-14 ENCOUNTER — Telehealth: Payer: Self-pay | Admitting: Internal Medicine

## 2014-08-14 NOTE — Telephone Encounter (Signed)
retunred pt call and confirmed that i added flush for same day as visit....pt ok and aware

## 2014-09-04 ENCOUNTER — Other Ambulatory Visit: Payer: Self-pay | Admitting: *Deleted

## 2014-09-04 DIAGNOSIS — C3490 Malignant neoplasm of unspecified part of unspecified bronchus or lung: Secondary | ICD-10-CM

## 2014-09-04 MED ORDER — ERLOTINIB HCL 150 MG PO TABS: 150.0000 mg | ORAL_TABLET | Freq: Every day | ORAL | Status: DC

## 2014-09-04 NOTE — Telephone Encounter (Signed)
THIS REFILL REQUEST FOR TARCEVA WAS PLACED ON DR.MOHAMED'S DESK.

## 2014-09-04 NOTE — Telephone Encounter (Signed)
RECEIVED A FAX FROM [facility] CONCERNING A CONFIRMATION OF FACSIMILE RECEIPT FOR PT. REFERRAL. 

## 2014-09-07 ENCOUNTER — Encounter: Payer: Self-pay | Admitting: Internal Medicine

## 2014-09-07 NOTE — Progress Notes (Signed)
Per Beverlee Nims patient approved for Tarceva. [id number]. Will let billing and medical records know.

## 2014-09-09 ENCOUNTER — Other Ambulatory Visit (HOSPITAL_BASED_OUTPATIENT_CLINIC_OR_DEPARTMENT_OTHER): Payer: Medicare Other

## 2014-09-09 ENCOUNTER — Encounter: Payer: Self-pay | Admitting: Internal Medicine

## 2014-09-09 ENCOUNTER — Ambulatory Visit (HOSPITAL_BASED_OUTPATIENT_CLINIC_OR_DEPARTMENT_OTHER): Payer: Medicare Other | Admitting: Internal Medicine

## 2014-09-09 ENCOUNTER — Telehealth: Payer: Self-pay | Admitting: Internal Medicine

## 2014-09-09 ENCOUNTER — Ambulatory Visit (HOSPITAL_BASED_OUTPATIENT_CLINIC_OR_DEPARTMENT_OTHER): Payer: Medicare Other

## 2014-09-09 VITALS — BP 109/61 | HR 78 | Temp 98.0°F | Resp 19 | Ht 66.0 in | Wt 226.9 lb

## 2014-09-09 DIAGNOSIS — C3430 Malignant neoplasm of lower lobe, unspecified bronchus or lung: Secondary | ICD-10-CM

## 2014-09-09 DIAGNOSIS — C3491 Malignant neoplasm of unspecified part of right bronchus or lung: Secondary | ICD-10-CM

## 2014-09-09 DIAGNOSIS — L270 Generalized skin eruption due to drugs and medicaments taken internally: Secondary | ICD-10-CM

## 2014-09-09 DIAGNOSIS — L853 Xerosis cutis: Secondary | ICD-10-CM

## 2014-09-09 DIAGNOSIS — C3490 Malignant neoplasm of unspecified part of unspecified bronchus or lung: Secondary | ICD-10-CM

## 2014-09-09 DIAGNOSIS — Z95828 Presence of other vascular implants and grafts: Secondary | ICD-10-CM

## 2014-09-09 LAB — CBC WITH DIFFERENTIAL/PLATELET
BASO%: 1 % (ref 0.0–2.0)
BASOS ABS: 0 10*3/uL (ref 0.0–0.1)
EOS ABS: 0.3 10*3/uL (ref 0.0–0.5)
EOS%: 5.4 % (ref 0.0–7.0)
HEMATOCRIT: 34.8 % (ref 34.8–46.6)
HEMOGLOBIN: 11.4 g/dL — AB (ref 11.6–15.9)
LYMPH#: 0.5 10*3/uL — AB (ref 0.9–3.3)
LYMPH%: 9.6 % — ABNORMAL LOW (ref 14.0–49.7)
MCH: 31.5 pg (ref 25.1–34.0)
MCHC: 32.9 g/dL (ref 31.5–36.0)
MCV: 95.7 fL (ref 79.5–101.0)
MONO#: 0.5 10*3/uL (ref 0.1–0.9)
MONO%: 10.1 % (ref 0.0–14.0)
NEUT%: 73.9 % (ref 38.4–76.8)
NEUTROS ABS: 3.5 10*3/uL (ref 1.5–6.5)
Platelets: 188 10*3/uL (ref 145–400)
RBC: 3.63 10*6/uL — ABNORMAL LOW (ref 3.70–5.45)
RDW: 13.7 % (ref 11.2–14.5)
WBC: 4.7 10*3/uL (ref 3.9–10.3)

## 2014-09-09 LAB — COMPREHENSIVE METABOLIC PANEL (CC13)
ALBUMIN: 2.7 g/dL — AB (ref 3.5–5.0)
ALT: 9 U/L (ref 0–55)
ANION GAP: 7 meq/L (ref 3–11)
AST: 14 U/L (ref 5–34)
Alkaline Phosphatase: 72 U/L (ref 40–150)
BUN: 21.9 mg/dL (ref 7.0–26.0)
CHLORIDE: 107 meq/L (ref 98–109)
CO2: 27 meq/L (ref 22–29)
CREATININE: 1.6 mg/dL — AB (ref 0.6–1.1)
Calcium: 9.8 mg/dL (ref 8.4–10.4)
GLUCOSE: 101 mg/dL (ref 70–140)
POTASSIUM: 3.9 meq/L (ref 3.5–5.1)
Sodium: 141 mEq/L (ref 136–145)
Total Bilirubin: 1.25 mg/dL — ABNORMAL HIGH (ref 0.20–1.20)
Total Protein: 5.8 g/dL — ABNORMAL LOW (ref 6.4–8.3)

## 2014-09-09 MED ORDER — SODIUM CHLORIDE 0.9 % IJ SOLN
10.0000 mL | INTRAMUSCULAR | Status: DC | PRN
  Administered 2014-09-09: 10 mL via INTRAVENOUS
  Filled 2014-09-09: qty 10

## 2014-09-09 MED ORDER — HEPARIN SOD (PORK) LOCK FLUSH 100 UNIT/ML IV SOLN
500.0000 [IU] | Freq: Once | INTRAVENOUS | Status: AC
Start: 2014-09-09 — End: 2014-09-09
  Administered 2014-09-09: 500 [IU] via INTRAVENOUS
  Filled 2014-09-09: qty 5

## 2014-09-09 NOTE — Telephone Encounter (Signed)
Pt confirmed labs/ov per 10/14 POF, gave pt AVS.... KJ °

## 2014-09-09 NOTE — Patient Instructions (Signed)

## 2014-09-09 NOTE — Progress Notes (Signed)
[place] Telephone:(336) [phone]   Fax:(336) [phone]  OFFICE PROGRESS NOTE  [name], MD [address] [place] [address]  DIAGNOSIS: Recurrent non-small cell lung cancer, adenocarcinoma initially diagnosed as stage IIA in March 2008. The tumor cells were negative for EGFR mutation as well as ALK gene translocation.   PRIOR THERAPY:  1. Status post concurrent chemoradiation with weekly carboplatin and paclitaxel. Last dose was given Apr 12, 2007. 2. Status post 3 cycles of consolidation chemotherapy with docetaxel. Last dose was given July 12, 2007. 3. 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 4. Status post 39 cycles of maintenance Alimta at a dose of 500 mg/m2. Last dose was given February 25, 2010, discontinued secondary to persistent anemia, but the patient has stable disease.  CURRENT THERAPY: Tarceva 150 mg by mouth daily. Status post approximately 25 months of therapy.   CHEMOTHERAPY INTENT: Palliative  CURRENT # OF CHEMOTHERAPY CYCLES: 26 CURRENT ANTIEMETICS: Compazine when necessary  CURRENT SMOKING STATUS: Former smoker quit 11/27/2001  ORAL CHEMOTHERAPY AND CONSENT: Tarceva  CURRENT BISPHOSPHONATES USE: None  PAIN MANAGEMENT: 0/10  NARCOTICS INDUCED CONSTIPATION: None  LIVING WILL AND CODE STATUS: Full code initially but no prolonged resuscitation.    INTERVAL HISTORY: Diane [name] 73 y.o. female returns to the clinic today for routine monthly followup visit. The patient is feeling fine today. She is tolerating her current treatment with Tarceva fairly well except for the persistent skin rash mainly on the face and scalp. She was seen by dermatology and was getting treatment with hydrocortisone creams with great improvement in the scalp lesions but her face is much better. She denied having any diarrhea. She has no significant fever or chills no nausea or  vomiting. She has no chest pain but continues to have shortness of breath with exertion with no hemoptysis.  MEDICAL HISTORY: Past Medical History  Diagnosis Date  . Hypertension   . Renal insufficiency     stage 3 renal disease/ pt  . lung ca dx'd 02/2010  . 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  MEDICATIONS:  Current Outpatient Prescriptions  Medication Sig Dispense Refill  . aspirin 81 MG tablet Take 81 mg by mouth daily.        . calcitRIOL (ROCALTROL) 0.25 MCG capsule Take 0.25 mcg by mouth every Monday, Wednesday, and Friday.      . clindamycin (CLEOCIN-T) 1 % external solution Apply topically 2 (two) times daily. Apply as directed  60 mL  1  . doxycycline (VIBRA-TABS) 100 MG tablet Take 1 tablet (100 mg total) by mouth 2 (two) times daily.  20 tablet  0  . erlotinib (TARCEVA) 150 MG tablet Take 1 tablet (150 mg total) by mouth daily. Take on an empty stomach 1 hour before meals or 2 hours after.  30 tablet  1  . labetalol (NORMODYNE) 200 MG tablet Take 200 mg by mouth 2 (two) times daily.        Marland Kitchen neomycin-polymyxin b-dexamethasone (MAXITROL) 3.5-10000-0.1 SUSP       . polyethylene glycol (MIRALAX / GLYCOLAX) packet Take 17 g by mouth daily.      Marland Kitchen torsemide (DEMADEX) 20 MG tablet Take 20 mg by mouth daily.         No current facility-administered medications for this visit.   Facility-Administered Medications Ordered in Other Visits  Medication Dose  Route Frequency Provider Last Rate Last Dose  . sodium chloride 0.9 % injection 10 mL  10 mL Intravenous PRN Curt Bears, MD   10 mL at 10/14/13 1030    SURGICAL HISTORY:  Past Surgical History  Procedure Laterality Date  . Portacath placement  11/03/08-[facility]    tip in mid SVC-E. Mansell    REVIEW OF SYSTEMS:  Constitutional: positive for fatigue Eyes: negative Ears, nose, mouth, throat, and face: negative Respiratory: positive for dyspnea on exertion Cardiovascular:  negative Gastrointestinal: negative Genitourinary:negative Integument/breast: positive for dryness Hematologic/lymphatic: negative Musculoskeletal:negative Neurological: negative Behavioral/Psych: negative Endocrine: negative Allergic/Immunologic: negative   PHYSICAL EXAMINATION: General appearance: alert, cooperative and no distress Head: Normocephalic, without obvious abnormality, atraumatic Neck: no adenopathy, no carotid bruit, no JVD, supple, symmetrical, trachea midline and thyroid not enlarged, symmetric, no tenderness/mass/nodules Lymph nodes: Cervical, supraclavicular, and axillary nodes normal. Resp: clear to auscultation bilaterally and normal percussion bilaterally Back: symmetric, no curvature. ROM normal. No CVA tenderness. Cardio: regular rate and rhythm, S1, S2 normal, no murmur, click, rub or gallop GI: soft, non-tender; bowel sounds normal; no masses,  no organomegaly Extremities: extremities normal, atraumatic, no cyanosis or edema Neurologic: Alert and oriented X 3, normal strength and tone. Normal symmetric reflexes. Normal coordination and gait  ECOG PERFORMANCE STATUS: 1 - Symptomatic but completely ambulatory  Blood pressure 109/61, pulse 78, temperature 98 F (36.7 C), temperature source Oral, resp. rate 19, height $RemoveBe'5\' 6"'[id number]$  (1.676 m), weight 226 lb 14.4 oz (102.921 kg), SpO2 99.00%.  LABORATORY DATA: Lab Results  Component Value Date   WBC 4.7 09/09/2014   HGB 11.4* 09/09/2014   HCT 34.8 09/09/2014   MCV 95.7 09/09/2014   PLT 188 09/09/2014      Chemistry      Component Value Date/Time   NA 141 09/09/2014 0937   NA 137 11/14/2012 0816   NA 143 07/11/2012 0927   K 3.9 09/09/2014 0937   K 3.9 11/14/2012 0816   K 4.5 07/11/2012 0927   CL 102 05/08/2013 0851   CL 101 11/14/2012 0816   CL 101 07/11/2012 0927   CO2 27 09/09/2014 0937   CO2 28 11/14/2012 0816   CO2 29 07/11/2012 0927   BUN 21.9 09/09/2014 0937   BUN 17 11/14/2012 0816   BUN 16 07/11/2012  0927   CREATININE 1.6* 09/09/2014 0937   CREATININE 1.52* 11/14/2012 0816   CREATININE 1.5* 07/11/2012 0927      Component Value Date/Time   CALCIUM 9.8 09/09/2014 0937   CALCIUM 10.2 11/14/2012 0816   CALCIUM 10.0 07/11/2012 0927   ALKPHOS 72 09/09/2014 0937   ALKPHOS 74 11/14/2012 0816   ALKPHOS 71 07/11/2012 0927   AST 14 09/09/2014 0937   AST 16 11/14/2012 0816   AST 19 07/11/2012 0927   ALT 9 09/09/2014 0937   ALT 8 11/14/2012 0816   ALT 18 07/11/2012 0927   BILITOT 1.25* 09/09/2014 0937   BILITOT 1.2 11/14/2012 0816   BILITOT 0.80 07/11/2012 0927       RADIOGRAPHIC STUDIES:  ASSESSMENT AND PLAN: This is a very pleasant 73 years old white female with metastatic non-small cell lung cancer currently on treatment with Tarceva 150 mg by mouth daily status post 24 months of treatment and tolerating it fairly well except for dry skin and grade 1-2 skin rash on the face and scalp. I recommended for the patient to continue her current treatment with Tarceva 150 mg by mouth daily. I also discussed with the patient reducing  the dose of Tarceva to 100 mg because of the persistent skin rash but she declined this option. She would come back for followup visit in one month for reevaluation with repeat CBC and comprehensive metabolic panel. She will continue to have Port-A-Cath flush every 8 weeks. She was advised to call immediately if she has any concerning symptoms in the interval.  The patient voices understanding of current disease status and treatment options and is in agreement with the current care plan.  All questions were answered. The patient knows to call the clinic with any problems, questions or concerns. We can certainly see the patient much sooner if necessary.  Disclaimer: This note was dictated with voice recognition software. Similar sounding words can inadvertently be transcribed and may not be corrected upon review.

## 2014-10-07 ENCOUNTER — Other Ambulatory Visit (HOSPITAL_BASED_OUTPATIENT_CLINIC_OR_DEPARTMENT_OTHER): Payer: Medicare Other

## 2014-10-07 ENCOUNTER — Encounter: Payer: Self-pay | Admitting: Internal Medicine

## 2014-10-07 ENCOUNTER — Telehealth: Payer: Self-pay | Admitting: Internal Medicine

## 2014-10-07 ENCOUNTER — Ambulatory Visit (HOSPITAL_BASED_OUTPATIENT_CLINIC_OR_DEPARTMENT_OTHER): Payer: Medicare Other | Admitting: Internal Medicine

## 2014-10-07 VITALS — BP 122/51 | HR 82 | Temp 97.8°F | Resp 18 | Ht 66.0 in | Wt 228.9 lb

## 2014-10-07 DIAGNOSIS — C3491 Malignant neoplasm of unspecified part of right bronchus or lung: Secondary | ICD-10-CM

## 2014-10-07 DIAGNOSIS — C3430 Malignant neoplasm of lower lobe, unspecified bronchus or lung: Secondary | ICD-10-CM

## 2014-10-07 DIAGNOSIS — R21 Rash and other nonspecific skin eruption: Secondary | ICD-10-CM

## 2014-10-07 LAB — COMPREHENSIVE METABOLIC PANEL (CC13)
ALT: 11 U/L (ref 0–55)
AST: 14 U/L (ref 5–34)
Albumin: 2.9 g/dL — ABNORMAL LOW (ref 3.5–5.0)
Alkaline Phosphatase: 72 U/L (ref 40–150)
Anion Gap: 5 mEq/L (ref 3–11)
BILIRUBIN TOTAL: 1.11 mg/dL (ref 0.20–1.20)
BUN: 25.5 mg/dL (ref 7.0–26.0)
CO2: 28 meq/L (ref 22–29)
Calcium: 9.9 mg/dL (ref 8.4–10.4)
Chloride: 106 mEq/L (ref 98–109)
Creatinine: 1.6 mg/dL — ABNORMAL HIGH (ref 0.6–1.1)
Glucose: 70 mg/dl (ref 70–140)
Potassium: 3.9 mEq/L (ref 3.5–5.1)
Sodium: 139 mEq/L (ref 136–145)
Total Protein: 5.7 g/dL — ABNORMAL LOW (ref 6.4–8.3)

## 2014-10-07 LAB — CBC WITH DIFFERENTIAL/PLATELET
BASO%: 1 % (ref 0.0–2.0)
Basophils Absolute: 0.1 10*3/uL (ref 0.0–0.1)
EOS%: 5.1 % (ref 0.0–7.0)
Eosinophils Absolute: 0.3 10*3/uL (ref 0.0–0.5)
HEMATOCRIT: 36.2 % (ref 34.8–46.6)
HGB: 11.7 g/dL (ref 11.6–15.9)
LYMPH%: 11.3 % — AB (ref 14.0–49.7)
MCH: 31.3 pg (ref 25.1–34.0)
MCHC: 32.5 g/dL (ref 31.5–36.0)
MCV: 96.3 fL (ref 79.5–101.0)
MONO#: 0.6 10*3/uL (ref 0.1–0.9)
MONO%: 10.8 % (ref 0.0–14.0)
NEUT#: 3.8 10*3/uL (ref 1.5–6.5)
NEUT%: 71.8 % (ref 38.4–76.8)
PLATELETS: 206 10*3/uL (ref 145–400)
RBC: 3.75 10*6/uL (ref 3.70–5.45)
RDW: 13.7 % (ref 11.2–14.5)
WBC: 5.3 10*3/uL (ref 3.9–10.3)
lymph#: 0.6 10*3/uL — ABNORMAL LOW (ref 0.9–3.3)

## 2014-10-07 NOTE — Telephone Encounter (Signed)
Gave avs & cal for Dec. °

## 2014-10-07 NOTE — Progress Notes (Signed)
[facility] [phone]   [phone]  OFFICE PROGRESS NOTE  [name], MD [address] [place] [address]  DIAGNOSIS: Recurrent non-small cell lung cancer, adenocarcinoma initially diagnosed as stage IIA in March 2008. The tumor cells were negative for EGFR mutation as well as ALK gene translocation.   PRIOR THERAPY:  1. Status post concurrent chemoradiation with weekly carboplatin and paclitaxel. Last dose was given Apr 12, 2007. 2. Status post 3 cycles of consolidation chemotherapy with docetaxel. Last dose was given July 12, 2007. 3. 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 4. Status post 39 cycles of maintenance Alimta at a dose of 500 mg/m2. Last dose was given February 25, 2010, discontinued secondary to persistent anemia, but the patient has stable disease.  CURRENT THERAPY: Tarceva 150 mg by mouth daily. Status post approximately 26 months of therapy.   CHEMOTHERAPY INTENT: Palliative  CURRENT # OF CHEMOTHERAPY CYCLES: 27 CURRENT ANTIEMETICS: Compazine when necessary  CURRENT SMOKING STATUS: Former smoker quit 11/27/2001  ORAL CHEMOTHERAPY AND CONSENT: Tarceva  CURRENT BISPHOSPHONATES USE: None  PAIN MANAGEMENT: 0/10  NARCOTICS INDUCED CONSTIPATION: None  LIVING WILL AND CODE STATUS: Full code initially but no prolonged resuscitation.    INTERVAL HISTORY: Diane [name] 73 y.o. female returns to the clinic today for routine monthly followup visit. The patient is feeling fine today. She is tolerating her current treatment with Tarceva fairly well except for the persistent skin rash mainly on the face and the scabs on her head. The skin rash on the face looks much better. She denied having any significant episodes of diarrhea. She has no significant fever or chills no nausea or vomiting. She has no chest pain but continues to have shortness of breath  with exertion with no hemoptysis.  MEDICAL HISTORY: Past Medical History  Diagnosis Date  . Hypertension   . Renal insufficiency     stage 3 renal disease/ pt  . lung ca dx'd 02/2010  . 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  MEDICATIONS:  Current Outpatient Prescriptions  Medication Sig Dispense Refill  . aspirin 81 MG tablet Take 81 mg by mouth daily.      . calcitRIOL (ROCALTROL) 0.25 MCG capsule Take 0.25 mcg by mouth every Monday, Wednesday, and Friday.    . clindamycin (CLEOCIN-T) 1 % external solution Apply topically 2 (two) times daily. Apply as directed 60 mL 1  . erlotinib (TARCEVA) 150 MG tablet Take 1 tablet (150 mg total) by mouth daily. Take on an empty stomach 1 hour before meals or 2 hours after. 30 tablet 1  . labetalol (NORMODYNE) 200 MG tablet Take 200 mg by mouth 2 (two) times daily.      Marland Kitchen neomycin-polymyxin b-dexamethasone (MAXITROL) 3.5-10000-0.1 SUSP     . polyethylene glycol (MIRALAX / GLYCOLAX) packet Take 17 g by mouth daily.    Marland Kitchen torsemide (DEMADEX) 20 MG tablet Take 20 mg by mouth daily.       No current facility-administered medications for this visit.   Facility-Administered Medications Ordered in Other Visits  Medication Dose Route Frequency Provider Last Rate Last Dose  . sodium chloride 0.9 % injection 10 mL  10 mL Intravenous PRN Curt Bears, MD   10 mL at 10/14/13 1030    SURGICAL HISTORY:  Past Surgical History  Procedure Laterality Date  . Portacath placement  11/03/08-[facility]    tip in mid SVC-E. Mansell    REVIEW OF SYSTEMS:  Constitutional: positive for fatigue Eyes: negative Ears, nose, mouth, throat, and face: negative Respiratory: positive for dyspnea on exertion Cardiovascular: negative Gastrointestinal: negative Genitourinary:negative Integument/breast: positive for dryness Hematologic/lymphatic: negative Musculoskeletal:negative Neurological: negative Behavioral/Psych:  negative Endocrine: negative Allergic/Immunologic: negative   PHYSICAL EXAMINATION: General appearance: alert, cooperative and no distress Head: Normocephalic, without obvious abnormality, atraumatic Neck: no adenopathy, no carotid bruit, no JVD, supple, symmetrical, trachea midline and thyroid not enlarged, symmetric, no tenderness/mass/nodules Lymph nodes: Cervical, supraclavicular, and axillary nodes normal. Resp: clear to auscultation bilaterally and normal percussion bilaterally Back: symmetric, no curvature. ROM normal. No CVA tenderness. Cardio: regular rate and rhythm, S1, S2 normal, no murmur, click, rub or gallop GI: soft, non-tender; bowel sounds normal; no masses,  no organomegaly Extremities: extremities normal, atraumatic, no cyanosis or edema Neurologic: Alert and oriented X 3, normal strength and tone. Normal symmetric reflexes. Normal coordination and gait  ECOG PERFORMANCE STATUS: 1 - Symptomatic but completely ambulatory  Blood pressure 122/51, pulse 82, temperature 97.8 F (36.6 C), temperature source Oral, resp. rate 18, height $RemoveBe'5\' 6"'bOqekqmdp$  (1.676 m), weight 228 lb 14.4 oz (103.828 kg), SpO2 98 %.  LABORATORY DATA: Lab Results  Component Value Date   WBC 5.3 10/07/2014   HGB 11.7 10/07/2014   HCT 36.2 10/07/2014   MCV 96.3 10/07/2014   PLT 206 10/07/2014      Chemistry      Component Value Date/Time   NA 139 10/07/2014 0921   NA 137 11/14/2012 0816   NA 143 07/11/2012 0927   K 3.9 10/07/2014 0921   K 3.9 11/14/2012 0816   K 4.5 07/11/2012 0927   CL 102 05/08/2013 0851   CL 101 11/14/2012 0816   CL 101 07/11/2012 0927   CO2 28 10/07/2014 0921   CO2 28 11/14/2012 0816   CO2 29 07/11/2012 0927   BUN 25.5 10/07/2014 0921   BUN 17 11/14/2012 0816   BUN 16 07/11/2012 0927   CREATININE 1.6* 10/07/2014 0921   CREATININE 1.52* 11/14/2012 0816   CREATININE 1.5* 07/11/2012 0927      Component Value Date/Time   CALCIUM 9.9 10/07/2014 0921   CALCIUM 10.2  11/14/2012 0816   CALCIUM 10.0 07/11/2012 0927   ALKPHOS 72 10/07/2014 0921   ALKPHOS 74 11/14/2012 0816   ALKPHOS 71 07/11/2012 0927   AST 14 10/07/2014 0921   AST 16 11/14/2012 0816   AST 19 07/11/2012 0927   ALT 11 10/07/2014 0921   ALT 8 11/14/2012 0816   ALT 18 07/11/2012 0927   BILITOT 1.11 10/07/2014 0921   BILITOT 1.2 11/14/2012 0816   BILITOT 0.80 07/11/2012 0927       RADIOGRAPHIC STUDIES:  ASSESSMENT AND PLAN: This is a very pleasant 73 years old white female with metastatic non-small cell lung cancer currently on treatment with Tarceva 150 mg by mouth daily status post 24 months of treatment and tolerating it fairly well except for dry skin and grade 1-2 skin rash on the face and scalp. I recommended for the patient to continue her current treatment with Tarceva 150 mg by mouth daily. She would come back for followup visit in one month for reevaluation with repeat CBC and comprehensive metabolic panel as well as CT scan of the chest. She will continue to have Port-A-Cath flush every 8 weeks. She was advised to call immediately if she has any concerning symptoms in the interval.  The patient  voices understanding of current disease status and treatment options and is in agreement with the current care plan.  All questions were answered. The patient knows to call the clinic with any problems, questions or concerns. We can certainly see the patient much sooner if necessary.  Disclaimer: This note was dictated with voice recognition software. Similar sounding words can inadvertently be transcribed and may not be corrected upon review.

## 2014-10-29 ENCOUNTER — Ambulatory Visit (HOSPITAL_BASED_OUTPATIENT_CLINIC_OR_DEPARTMENT_OTHER): Payer: Medicare Other

## 2014-10-29 ENCOUNTER — Ambulatory Visit (HOSPITAL_COMMUNITY)
Admission: RE | Admit: 2014-10-29 | Discharge: 2014-10-29 | Disposition: A | Discharge status: Home / Self Care | Payer: Medicare Other | Source: Ambulatory Visit | Attending: Internal Medicine | Admitting: Internal Medicine

## 2014-10-29 ENCOUNTER — Other Ambulatory Visit (HOSPITAL_BASED_OUTPATIENT_CLINIC_OR_DEPARTMENT_OTHER): Payer: Medicare Other

## 2014-10-29 DIAGNOSIS — Z85118 Personal history of other malignant neoplasm of bronchus and lung: Secondary | ICD-10-CM | POA: Diagnosis present

## 2014-10-29 DIAGNOSIS — C3491 Malignant neoplasm of unspecified part of right bronchus or lung: Secondary | ICD-10-CM

## 2014-10-29 DIAGNOSIS — R0602 Shortness of breath: Secondary | ICD-10-CM | POA: Diagnosis not present

## 2014-10-29 DIAGNOSIS — Z95828 Presence of other vascular implants and grafts: Secondary | ICD-10-CM

## 2014-10-29 DIAGNOSIS — Z452 Encounter for adjustment and management of vascular access device: Secondary | ICD-10-CM

## 2014-10-29 DIAGNOSIS — Z923 Personal history of irradiation: Secondary | ICD-10-CM | POA: Diagnosis not present

## 2014-10-29 DIAGNOSIS — C3430 Malignant neoplasm of lower lobe, unspecified bronchus or lung: Secondary | ICD-10-CM

## 2014-10-29 DIAGNOSIS — Z9221 Personal history of antineoplastic chemotherapy: Secondary | ICD-10-CM | POA: Insufficient documentation

## 2014-10-29 DIAGNOSIS — R05 Cough: Secondary | ICD-10-CM | POA: Diagnosis not present

## 2014-10-29 LAB — COMPREHENSIVE METABOLIC PANEL (CC13)
ALT: 11 U/L (ref 0–55)
AST: 15 U/L (ref 5–34)
Albumin: 2.8 g/dL — ABNORMAL LOW (ref 3.5–5.0)
Alkaline Phosphatase: 76 U/L (ref 40–150)
Anion Gap: 8 mEq/L (ref 3–11)
BILIRUBIN TOTAL: 1.37 mg/dL — AB (ref 0.20–1.20)
BUN: 18 mg/dL (ref 7.0–26.0)
CALCIUM: 9.7 mg/dL (ref 8.4–10.4)
CHLORIDE: 105 meq/L (ref 98–109)
CO2: 28 mEq/L (ref 22–29)
CREATININE: 1.5 mg/dL — AB (ref 0.6–1.1)
EGFR: 34 mL/min/{1.73_m2} — ABNORMAL LOW (ref >90)
Glucose: 87 mg/dl (ref 70–140)
Potassium: 4 mEq/L (ref 3.5–5.1)
SODIUM: 140 meq/L (ref 136–145)
TOTAL PROTEIN: 5.5 g/dL — AB (ref 6.4–8.3)

## 2014-10-29 LAB — CBC WITH DIFFERENTIAL/PLATELET
BASO%: 0.4 % (ref 0.0–2.0)
Basophils Absolute: 0 10*3/uL (ref 0.0–0.1)
EOS%: 3.3 % (ref 0.0–7.0)
Eosinophils Absolute: 0.2 10*3/uL (ref 0.0–0.5)
HEMATOCRIT: 35.1 % (ref 34.8–46.6)
HGB: 11.4 g/dL — ABNORMAL LOW (ref 11.6–15.9)
LYMPH%: 10.3 % — ABNORMAL LOW (ref 14.0–49.7)
MCH: 31 pg (ref 25.1–34.0)
MCHC: 32.5 g/dL (ref 31.5–36.0)
MCV: 95.4 fL (ref 79.5–101.0)
MONO#: 0.4 10*3/uL (ref 0.1–0.9)
MONO%: 8.1 % (ref 0.0–14.0)
NEUT#: 4.3 10*3/uL (ref 1.5–6.5)
NEUT%: 77.9 % — AB (ref 38.4–76.8)
Platelets: 153 10*3/uL (ref 145–400)
RBC: 3.68 10*6/uL — ABNORMAL LOW (ref 3.70–5.45)
RDW: 13.6 % (ref 11.2–14.5)
WBC: 5.5 10*3/uL (ref 3.9–10.3)
lymph#: 0.6 10*3/uL — ABNORMAL LOW (ref 0.9–3.3)

## 2014-10-29 MED ORDER — HEPARIN SOD (PORK) LOCK FLUSH 100 UNIT/ML IV SOLN
500.0000 [IU] | Freq: Once | INTRAVENOUS | Status: AC
  Administered 2014-10-29: 500 [IU] via INTRAVENOUS
  Filled 2014-10-29: qty 5

## 2014-10-29 MED ORDER — SODIUM CHLORIDE 0.9 % IJ SOLN
10.0000 mL | INTRAMUSCULAR | Status: DC | PRN
  Administered 2014-10-29: 10 mL via INTRAVENOUS
  Filled 2014-10-29: qty 10

## 2014-10-29 NOTE — Patient Instructions (Signed)

## 2014-11-03 ENCOUNTER — Other Ambulatory Visit: Payer: Self-pay | Admitting: *Deleted

## 2014-11-03 DIAGNOSIS — C3490 Malignant neoplasm of unspecified part of unspecified bronchus or lung: Secondary | ICD-10-CM

## 2014-11-03 MED ORDER — ERLOTINIB HCL 150 MG PO TABS: 150.0000 mg | ORAL_TABLET | Freq: Every day | ORAL | Status: DC

## 2014-11-03 NOTE — Telephone Encounter (Signed)
RECEIVED A FAX FROM [facility] CONCERNING A CONFIRMATION OF FACSIMILE RECEIPT FOR PT. REFERRAL. 

## 2014-11-03 NOTE — Addendum Note (Signed)
Addended by: Wyonia Hough on: 11/03/2014 12:45 PM   Modules accepted: Orders

## 2014-11-03 NOTE — Telephone Encounter (Signed)
THIS REFILL REQUEST FOR TARCEVA WAS GIVEN TO DR.MOHAMED'S NURSE, DIANE BELL,RN.

## 2014-11-05 ENCOUNTER — Ambulatory Visit (HOSPITAL_BASED_OUTPATIENT_CLINIC_OR_DEPARTMENT_OTHER): Payer: Medicare Other | Admitting: Internal Medicine

## 2014-11-05 ENCOUNTER — Encounter: Payer: Self-pay | Admitting: Internal Medicine

## 2014-11-05 ENCOUNTER — Telehealth: Payer: Self-pay | Admitting: Internal Medicine

## 2014-11-05 VITALS — BP 109/50 | HR 81 | Temp 98.0°F | Resp 17 | Ht 66.0 in | Wt 229.0 lb

## 2014-11-05 DIAGNOSIS — C3430 Malignant neoplasm of lower lobe, unspecified bronchus or lung: Secondary | ICD-10-CM

## 2014-11-05 DIAGNOSIS — R21 Rash and other nonspecific skin eruption: Secondary | ICD-10-CM | POA: Insufficient documentation

## 2014-11-05 DIAGNOSIS — C3491 Malignant neoplasm of unspecified part of right bronchus or lung: Secondary | ICD-10-CM

## 2014-11-05 NOTE — Telephone Encounter (Signed)
Gave avs & cal for Jan

## 2014-11-05 NOTE — Progress Notes (Signed)
[facility] [phone]   Fax:(336) [phone]  OFFICE PROGRESS NOTE  [name], MD [address] [place]  [address]  DIAGNOSIS: Recurrent non-small cell lung cancer, adenocarcinoma initially diagnosed as stage IIA in March 2008. The tumor cells were negative for EGFR mutation as well as ALK gene translocation.   PRIOR THERAPY:  1. Status post concurrent chemoradiation with weekly carboplatin and paclitaxel. Last dose was given Apr 12, 2007. 2. Status post 3 cycles of consolidation chemotherapy with docetaxel. Last dose was given July 12, 2007. 3. 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 4. Status post 39 cycles of maintenance Alimta at a dose of 500 mg/m2. Last dose was given February 25, 2010, discontinued secondary to persistent anemia, but the patient has stable disease.  CURRENT THERAPY: Tarceva 150 mg by mouth daily. Status post approximately 27 months of therapy.   CHEMOTHERAPY INTENT: Palliative  CURRENT # OF CHEMOTHERAPY CYCLES: 28 CURRENT ANTIEMETICS: Compazine when necessary  CURRENT SMOKING STATUS: Former smoker quit 11/27/2001  ORAL CHEMOTHERAPY AND CONSENT: Tarceva  CURRENT BISPHOSPHONATES USE: None  PAIN MANAGEMENT: 0/10  NARCOTICS INDUCED CONSTIPATION: None  LIVING WILL AND CODE STATUS: Full code initially but no prolonged resuscitation.    INTERVAL HISTORY: Diane [name] 73 y.o. female returns to the clinic today for routine monthly followup visit. The patient is feeling fine today. She is tolerating her current treatment with Tarceva fairly well except for the persistent skin rash and the scabs on her head. The skin rash on the face looks much better. She denied having any significant episodes of diarrhea. She has no significant fever or chills no nausea or vomiting. She has occasional pain on the right side of the chest and shortness of breath with  exertion with no cough or hemoptysis. She denied having any significant weight loss or night sweats. She has no nausea or vomiting. She had repeat CT scan of the chest performed recently and she is here for evaluation and discussion of her scan results.  MEDICAL HISTORY: Past Medical History  Diagnosis Date  . Hypertension   . Renal insufficiency     stage 3 renal disease/ pt  . lung ca dx'd 02/2010  . 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  MEDICATIONS:  Current Outpatient Prescriptions  Medication Sig Dispense Refill  . aspirin 81 MG tablet Take 81 mg by mouth daily.      . calcitRIOL (ROCALTROL) 0.25 MCG capsule Take 0.25 mcg by mouth every Monday, Wednesday, and Friday.    . clindamycin (CLEOCIN-T) 1 % external solution Apply topically 2 (two) times daily. Apply as directed 60 mL 1  . erlotinib (TARCEVA) 150 MG tablet Take 1 tablet (150 mg total) by mouth daily. Take on an empty stomach 1 hour before meals or 2 hours after. 30 tablet 2  . labetalol (NORMODYNE) 200 MG tablet Take 200 mg by mouth 2 (two) times daily.      Marland Kitchen neomycin-polymyxin b-dexamethasone (MAXITROL) 3.5-10000-0.1 SUSP     . polyethylene glycol (MIRALAX / GLYCOLAX) packet Take 17 g by mouth daily.    Marland Kitchen torsemide (DEMADEX) 20 MG tablet Take 20 mg by mouth daily.       No current facility-administered medications for this visit.   Facility-Administered Medications Ordered in Other Visits  Medication Dose Route Frequency Provider Last Rate Last Dose  . sodium  chloride 0.9 % injection 10 mL  10 mL Intravenous PRN Curt Bears, MD   10 mL at 10/14/13 1030    SURGICAL HISTORY:  Past Surgical History  Procedure Laterality Date  . Portacath placement  11/03/08-[facility]    tip in mid SVC-E. Mansell    REVIEW OF SYSTEMS:  Constitutional: positive for fatigue Eyes: negative Ears, nose, mouth, throat, and face: negative Respiratory: positive for dyspnea on  exertion Cardiovascular: negative Gastrointestinal: negative Genitourinary:negative Integument/breast: positive for dryness Hematologic/lymphatic: negative Musculoskeletal:negative Neurological: negative Behavioral/Psych: negative Endocrine: negative Allergic/Immunologic: negative   PHYSICAL EXAMINATION: General appearance: alert, cooperative and no distress Head: Normocephalic, without obvious abnormality, atraumatic Neck: no adenopathy, no carotid bruit, no JVD, supple, symmetrical, trachea midline and thyroid not enlarged, symmetric, no tenderness/mass/nodules Lymph nodes: Cervical, supraclavicular, and axillary nodes normal. Resp: clear to auscultation bilaterally and normal percussion bilaterally Back: symmetric, no curvature. ROM normal. No CVA tenderness. Cardio: regular rate and rhythm, S1, S2 normal, no murmur, click, rub or gallop GI: soft, non-tender; bowel sounds normal; no masses,  no organomegaly Extremities: extremities normal, atraumatic, no cyanosis or edema Neurologic: Alert and oriented X 3, normal strength and tone. Normal symmetric reflexes. Normal coordination and gait  ECOG PERFORMANCE STATUS: 1 - Symptomatic but completely ambulatory  Blood pressure 109/50, pulse 81, temperature 98 F (36.7 C), temperature source Oral, resp. rate 17, height $RemoveBe'5\' 6"'OZwfJALpX$  (1.676 m), weight 229 lb (103.874 kg), SpO2 96 %.  LABORATORY DATA: Lab Results  Component Value Date   WBC 5.5 10/29/2014   HGB 11.4* 10/29/2014   HCT 35.1 10/29/2014   MCV 95.4 10/29/2014   PLT 153 10/29/2014      Chemistry      Component Value Date/Time   NA 140 10/29/2014 0939   NA 137 11/14/2012 0816   NA 143 07/11/2012 0927   K 4.0 10/29/2014 0939   K 3.9 11/14/2012 0816   K 4.5 07/11/2012 0927   CL 102 05/08/2013 0851   CL 101 11/14/2012 0816   CL 101 07/11/2012 0927   CO2 28 10/29/2014 0939   CO2 28 11/14/2012 0816   CO2 29 07/11/2012 0927   BUN 18.0 10/29/2014 0939   BUN 17 11/14/2012  0816   BUN 16 07/11/2012 0927   CREATININE 1.5* 10/29/2014 0939   CREATININE 1.52* 11/14/2012 0816   CREATININE 1.5* 07/11/2012 0927      Component Value Date/Time   CALCIUM 9.7 10/29/2014 0939   CALCIUM 10.2 11/14/2012 0816   CALCIUM 10.0 07/11/2012 0927   ALKPHOS 76 10/29/2014 0939   ALKPHOS 74 11/14/2012 0816   ALKPHOS 71 07/11/2012 0927   AST 15 10/29/2014 0939   AST 16 11/14/2012 0816   AST 19 07/11/2012 0927   ALT 11 10/29/2014 0939   ALT 8 11/14/2012 0816   ALT 18 07/11/2012 0927   BILITOT 1.37* 10/29/2014 0939   BILITOT 1.2 11/14/2012 0816   BILITOT 0.80 07/11/2012 0927       RADIOGRAPHIC STUDIES: Ct Chest Wo Contrast  10/29/2014   CLINICAL DATA:  Lung cancer diagnosed in 2008. Chemotherapy and radiation therapy completed cough with shortness of breath. Subsequent encounter.  EXAM: CT CHEST WITHOUT CONTRAST  TECHNIQUE: Multidetector CT imaging of the chest was performed following the standard protocol without IV contrast.  COMPARISON:  Chest CT 08/10/2014 and 05/06/2014.  FINDINGS: Mediastinum: There are no enlarged mediastinal or hilar lymph nodes. There is stable mild nodularity of the thyroid gland. The trachea and esophagus appear stable. The heart size is  normal. There is no pericardial effusion.There are calcifications of the aorta, great vessels and coronary arteries. The carotid arteries are tortuous with a retropharyngeal course. Left IJ Port-A-Cath tip is in the lower SVC.  Lungs/Pleura: Chronic complex right pleural collection is stable with soft tissue and calcific components. There is no significant pleural fluid on the left.Right hilar distortion and paramediastinal radiation changes are stable. There are scattered ill-defined nodular densities throughout the right lung which are stable. In addition, small ground-glass and nodular densities in the left lung are stable. There are stable mild emphysematous changes.  Upper abdomen:  Unremarkable.  There is no adrenal  mass.  Musculoskeletal/Chest wall: There is no axillary adenopathy or chest wall mass. There are no suspicious osseous findings. There are stable degenerative changes throughout the spine.  IMPRESSION: 1. Stable appearance of the chest with radiation changes in the right perihilar region. No evidence of local recurrence or metastatic disease. 2. Stable scattered nodular densities in both lungs. 3. Stable chronic complex right pleural collection.   Electronically Signed   By: Camie Patience M.D.   On: 10/29/2014 10:45    ASSESSMENT AND PLAN: This is a very pleasant 73 years old white female with metastatic non-small cell lung cancer currently on treatment with Tarceva 150 mg by mouth daily status post 27 months of treatment and tolerating it fairly well except for dry skin and grade 1-2 skin rash on the face and scalp. The recent CT scan of the chest showed stable disease with no significant evidence for local recurrence or metastasis. I discussed the scan results with the patient today. I recommended for the patient to continue her current treatment with Tarceva 150 mg by mouth daily. She would come back for followup visit in one month for reevaluation with repeat CBC and comprehensive metabolic panel.She will continue to have Port-A-Cath flush every 8 weeks. She was advised to call immediately if she has any concerning symptoms in the interval.  The patient voices understanding of current disease status and treatment options and is in agreement with the current care plan.  All questions were answered. The patient knows to call the clinic with any problems, questions or concerns. We can certainly see the patient much sooner if necessary.  Disclaimer: This note was dictated with voice recognition software. Similar sounding words can inadvertently be transcribed and may not be corrected upon review.

## 2014-11-05 NOTE — Telephone Encounter (Signed)
Gave avs & cal for Jan 2016.

## 2014-11-06 NOTE — Telephone Encounter (Signed)
RECEIVED A FAX FROM [facility] CONCERNING A CONFIRMATION OF PRESCRIPTION SHIPMENT FOR [place] ON 11/05/14.

## 2014-12-03 ENCOUNTER — Other Ambulatory Visit (HOSPITAL_BASED_OUTPATIENT_CLINIC_OR_DEPARTMENT_OTHER): Payer: Medicare Other

## 2014-12-03 ENCOUNTER — Telehealth: Payer: Self-pay | Admitting: Internal Medicine

## 2014-12-03 ENCOUNTER — Encounter: Payer: Self-pay | Admitting: Physician Assistant

## 2014-12-03 ENCOUNTER — Ambulatory Visit (HOSPITAL_BASED_OUTPATIENT_CLINIC_OR_DEPARTMENT_OTHER): Payer: Medicare Other | Admitting: Physician Assistant

## 2014-12-03 VITALS — BP 99/59 | HR 83 | Temp 97.6°F | Resp 18 | Ht 66.0 in | Wt 230.2 lb

## 2014-12-03 DIAGNOSIS — L03039 Cellulitis of unspecified toe: Secondary | ICD-10-CM

## 2014-12-03 DIAGNOSIS — C3430 Malignant neoplasm of lower lobe, unspecified bronchus or lung: Secondary | ICD-10-CM

## 2014-12-03 DIAGNOSIS — G4700 Insomnia, unspecified: Secondary | ICD-10-CM

## 2014-12-03 DIAGNOSIS — R21 Rash and other nonspecific skin eruption: Secondary | ICD-10-CM

## 2014-12-03 DIAGNOSIS — C3491 Malignant neoplasm of unspecified part of right bronchus or lung: Secondary | ICD-10-CM

## 2014-12-03 LAB — COMPREHENSIVE METABOLIC PANEL (CC13)
ALK PHOS: 75 U/L (ref 40–150)
ALT: 8 U/L (ref 0–55)
AST: 15 U/L (ref 5–34)
Albumin: 2.8 g/dL — ABNORMAL LOW (ref 3.5–5.0)
Anion Gap: 8 mEq/L (ref 3–11)
BILIRUBIN TOTAL: 1.6 mg/dL — AB (ref 0.20–1.20)
BUN: 25.1 mg/dL (ref 7.0–26.0)
CALCIUM: 9.5 mg/dL (ref 8.4–10.4)
CO2: 28 mEq/L (ref 22–29)
Chloride: 106 mEq/L (ref 98–109)
Creatinine: 1.6 mg/dL — ABNORMAL HIGH (ref 0.6–1.1)
EGFR: 33 mL/min/{1.73_m2} — AB (ref >90)
Glucose: 80 mg/dl (ref 70–140)
POTASSIUM: 3.9 meq/L (ref 3.5–5.1)
Sodium: 142 mEq/L (ref 136–145)
Total Protein: 5.5 g/dL — ABNORMAL LOW (ref 6.4–8.3)

## 2014-12-03 LAB — CBC WITH DIFFERENTIAL/PLATELET
BASO%: 1.1 % (ref 0.0–2.0)
Basophils Absolute: 0 10*3/uL (ref 0.0–0.1)
EOS ABS: 0.3 10*3/uL (ref 0.0–0.5)
EOS%: 5.6 % (ref 0.0–7.0)
HCT: 35.5 % (ref 34.8–46.6)
HEMOGLOBIN: 11.6 g/dL (ref 11.6–15.9)
LYMPH%: 11.7 % — ABNORMAL LOW (ref 14.0–49.7)
MCH: 31.4 pg (ref 25.1–34.0)
MCHC: 32.7 g/dL (ref 31.5–36.0)
MCV: 95.9 fL (ref 79.5–101.0)
MONO#: 0.4 10*3/uL (ref 0.1–0.9)
MONO%: 8.3 % (ref 0.0–14.0)
NEUT#: 3.4 10*3/uL (ref 1.5–6.5)
NEUT%: 73.3 % (ref 38.4–76.8)
PLATELETS: 195 10*3/uL (ref 145–400)
RBC: 3.7 10*6/uL (ref 3.70–5.45)
RDW: 13.9 % (ref 11.2–14.5)
WBC: 4.6 10*3/uL (ref 3.9–10.3)
lymph#: 0.5 10*3/uL — ABNORMAL LOW (ref 0.9–3.3)

## 2014-12-03 NOTE — Patient Instructions (Signed)
Continue Tarceva 150 mg by mouth daily Follow up in 1 month

## 2014-12-03 NOTE — Telephone Encounter (Signed)
Pt confirmed labs/ov/flush per 01/07 POF, gave pt AVS..... KJ

## 2014-12-03 NOTE — Progress Notes (Addendum)
[facility] Telephone:([phone]) [phone]   Fax:([phone]  OFFICE PROGRESS NOTE  [name], MD [address] [place] [address]  DIAGNOSIS: Recurrent non-small cell lung cancer, adenocarcinoma initially diagnosed as stage IIA in March 2008. The tumor cells were negative for EGFR mutation as well as ALK gene translocation.   PRIOR THERAPY:  1. Status post concurrent chemoradiation with weekly carboplatin and paclitaxel. Last dose was given Apr 12, 2007. 2. Status post 3 cycles of consolidation chemotherapy with docetaxel. Last dose was given July 12, 2007. 3. 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 4. Status post 39 cycles of maintenance Alimta at a dose of 500 mg/m2. Last dose was given February 25, 2010, discontinued secondary to persistent anemia, but the patient has stable disease.  CURRENT THERAPY: Tarceva 150 mg by mouth daily. Status post approximately 28 months of therapy.   CHEMOTHERAPY INTENT: Palliative  CURRENT # OF CHEMOTHERAPY CYCLES: 29 CURRENT ANTIEMETICS: Compazine when necessary  CURRENT SMOKING STATUS: Former smoker quit 11/27/2001  ORAL CHEMOTHERAPY AND CONSENT: Tarceva  CURRENT BISPHOSPHONATES USE: None  PAIN MANAGEMENT: 0/10  NARCOTICS INDUCED CONSTIPATION: None  LIVING WILL AND CODE STATUS: Full code initially but no prolonged resuscitation.    INTERVAL HISTORY: Diane [name] 74 y.o. female returns to the clinic today for routine monthly followup visit. The patient is feeling fine today. She is tolerating her current treatment with Tarceva fairly well except for the persistent skin rash and the scabs on her head. Overall this is getting better swell. The skin rash on the face looks much better. She denied having any significant episodes of diarrhea. She does report some paronychia primarily affecting her toes although she has had an occasional paronychia  affecting her fingers. She requests the name of the podiatrist. She also reports some difficulty sleeping and would like to know if she could take melatonin. She has no significant fever or chills no nausea or vomiting. She has occasional pain on the right side of the chest and shortness of breath with exertion with no cough or hemoptysis. She denied having any significant weight loss or night sweats. She has no nausea or vomiting.   MEDICAL HISTORY: Past Medical History  Diagnosis Date  . Hypertension   . Renal insufficiency     stage 3 renal disease/ pt  . lung ca dx'd 02/2010  . 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  MEDICATIONS:  Current Outpatient Prescriptions  Medication Sig Dispense Refill  . aspirin 81 MG tablet Take 81 mg by mouth daily.      . calcitRIOL (ROCALTROL) 0.25 MCG capsule Take 0.25 mcg by mouth every Monday, Wednesday, and Friday.    . clindamycin (CLEOCIN-T) 1 % external solution Apply topically 2 (two) times daily. Apply as directed 60 mL 1  . erlotinib (TARCEVA) 150 MG tablet Take 1 tablet (150 mg total) by mouth daily. Take on an empty stomach 1 hour before meals or 2 hours after. 30 tablet 2  . labetalol (NORMODYNE) 200 MG tablet Take 200 mg by mouth 2 (two) times daily.      . polyethylene glycol (MIRALAX / GLYCOLAX) packet Take 17 g by mouth daily.    Marland Kitchen torsemide (DEMADEX) 20 MG tablet Take 20 mg by mouth daily.      Marland Kitchen neomycin-polymyxin b-dexamethasone (MAXITROL) 3.5-10000-0.1 SUSP      No  current facility-administered medications for this visit.   Facility-Administered Medications Ordered in Other Visits  Medication Dose Route Frequency Provider Last Rate Last Dose  . sodium chloride 0.9 % injection 10 mL  10 mL Intravenous PRN Si Gaul, MD   10 mL at 10/14/13 1030    SURGICAL HISTORY:  Past Surgical History  Procedure Laterality Date  . Portacath placement  11/03/08-[facility]    tip in mid SVC-E.  [facility]    REVIEW OF SYSTEMS:  Constitutional: positive for fatigue Eyes: negative Ears, nose, mouth, throat, and face: negative Respiratory: positive for dyspnea on exertion Cardiovascular: negative Gastrointestinal: negative Genitourinary:negative Integument/breast: positive for dryness, skin lesion(s) and Primarily in her scalp however greatly improved Hematologic/lymphatic: negative Musculoskeletal:negative Neurological: negative Behavioral/Psych: positive for sleep disturbance Endocrine: negative Allergic/Immunologic: negative   PHYSICAL EXAMINATION: General appearance: alert, cooperative and no distress Head: Normocephalic, without obvious abnormality, atraumatic Neck: no adenopathy, no carotid bruit, no JVD, supple, symmetrical, trachea midline and thyroid not enlarged, symmetric, no tenderness/mass/nodules Lymph nodes: Cervical, supraclavicular, and axillary nodes normal. Resp: clear to auscultation bilaterally and normal percussion bilaterally Back: symmetric, no curvature. ROM normal. No CVA tenderness. Cardio: regular rate and rhythm, S1, S2 normal, no murmur, click, rub or gallop GI: soft, non-tender; bowel sounds normal; no masses,  no organomegaly Extremities: extremities normal, atraumatic, no cyanosis or edema Neurologic: Alert and oriented X 3, normal strength and tone. Normal symmetric reflexes. Normal coordination and gait Scalp: left parietal area with lesions with eschar formation, no active/indurated acneform lesions, no evidence of infection  ECOG PERFORMANCE STATUS: 1 - Symptomatic but completely ambulatory  Blood pressure 99/59, pulse 83, temperature 97.6 F (36.4 C), temperature source Oral, resp. rate 18, height 5\' 6"  (1.676 m), weight 230 lb 3.2 oz (104.418 kg), SpO2 97 %.  LABORATORY DATA: Lab Results  Component Value Date   WBC 4.6 12/03/2014   HGB 11.6 12/03/2014   HCT 35.5 12/03/2014   MCV 95.9 12/03/2014   PLT 195 12/03/2014      Chemistry       Component Value Date/Time   NA 142 12/03/2014 0956   NA 137 11/14/2012 0816   NA 143 07/11/2012 0927   K 3.9 12/03/2014 0956   K 3.9 11/14/2012 0816   K 4.5 07/11/2012 0927   CL 102 05/08/2013 0851   CL 101 11/14/2012 0816   CL 101 07/11/2012 0927   CO2 28 12/03/2014 0956   CO2 28 11/14/2012 0816   CO2 29 07/11/2012 0927   BUN 25.1 12/03/2014 0956   BUN 17 11/14/2012 0816   BUN 16 07/11/2012 0927   CREATININE 1.6* 12/03/2014 0956   CREATININE 1.52* 11/14/2012 0816   CREATININE 1.5* 07/11/2012 0927      Component Value Date/Time   CALCIUM 9.5 12/03/2014 0956   CALCIUM 10.2 11/14/2012 0816   CALCIUM 10.0 07/11/2012 0927   ALKPHOS 75 12/03/2014 0956   ALKPHOS 74 11/14/2012 0816   ALKPHOS 71 07/11/2012 0927   AST 15 12/03/2014 0956   AST 16 11/14/2012 0816   AST 19 07/11/2012 0927   ALT 8 12/03/2014 0956   ALT 8 11/14/2012 0816   ALT 18 07/11/2012 0927   BILITOT 1.60* 12/03/2014 0956   BILITOT 1.2 11/14/2012 0816   BILITOT 0.80 07/11/2012 0927       RADIOGRAPHIC STUDIES: No results found.  ASSESSMENT AND PLAN: This is a very pleasant 74 years old white female with metastatic non-small cell lung cancer currently on treatment with Tarceva 150 mg by  mouth daily status post 28 months of treatment and tolerating it fairly well except for dry skin and grade 1-2 skin rash on the face and scalp. The recent CT scan of the chest showed stable disease with no significant evidence for local recurrence or metastasis. Patient was discussed with and also seen by Dr. [name]. She will continue her current treatment with Tarceva 150 mg by mouth daily. For insomnia she may try over-the-counter melatonin. She will follow-up in 1 month for another symptom management visit with repeat CBC and comprehensive metabolic panel.She will continue to have Port-A-Cath flush every 8 weeks. She was given the name, phone number and practice address for Dr. Rosemary Holms, podiatrist. She was  advised to call immediately if she has any concerning symptoms in the interval.  The patient voices understanding of current disease status and treatment options and is in agreement with the current care plan.  All questions were answered. The patient knows to call the clinic with any problems, questions or concerns. We can certainly see the patient much sooner if necessary.  Carlton Adam, PA-C 12/03/2014  ADDENDUM: Hematology/Oncology Attending: I had a face to face encounter with the patient. I recommended her care plan. This is a very pleasant 74 years old white female with recurrent non-small cell lung cancer, adenocarcinoma currently on treatment with Tarceva 150 mg by mouth daily status post 28 months and tolerating her treatment fairly well with no significant adverse effect except for the scabs in her scalp as well as mild skin rash. She has no significant diarrhea. I recommended for the patient to continue her current treatment with Tarceva. She would also see a podiatrist for mild paronychia of the toes. The patient would come back for follow-up visit in one month for evaluation. She was advised to call immediately if she has any concerning symptoms in the interval.  Disclaimer: This note was dictated with voice recognition software. Similar sounding words can inadvertently be transcribed and may not be corrected upon review. Eilleen Kempf., MD 12/05/2014

## 2014-12-31 ENCOUNTER — Telehealth: Payer: Self-pay | Admitting: Internal Medicine

## 2014-12-31 ENCOUNTER — Ambulatory Visit (HOSPITAL_BASED_OUTPATIENT_CLINIC_OR_DEPARTMENT_OTHER): Payer: Medicare Other | Admitting: Internal Medicine

## 2014-12-31 ENCOUNTER — Other Ambulatory Visit (HOSPITAL_BASED_OUTPATIENT_CLINIC_OR_DEPARTMENT_OTHER): Payer: Medicare Other

## 2014-12-31 ENCOUNTER — Ambulatory Visit: Payer: Medicare Other

## 2014-12-31 VITALS — BP 115/59 | HR 75 | Temp 98.1°F | Resp 18 | Ht 66.0 in | Wt 231.9 lb

## 2014-12-31 DIAGNOSIS — C3490 Malignant neoplasm of unspecified part of unspecified bronchus or lung: Secondary | ICD-10-CM

## 2014-12-31 DIAGNOSIS — C3491 Malignant neoplasm of unspecified part of right bronchus or lung: Secondary | ICD-10-CM

## 2014-12-31 DIAGNOSIS — Z95828 Presence of other vascular implants and grafts: Secondary | ICD-10-CM

## 2014-12-31 DIAGNOSIS — R21 Rash and other nonspecific skin eruption: Secondary | ICD-10-CM

## 2014-12-31 DIAGNOSIS — C3430 Malignant neoplasm of lower lobe, unspecified bronchus or lung: Secondary | ICD-10-CM

## 2014-12-31 LAB — CBC WITH DIFFERENTIAL/PLATELET
BASO%: 0.8 % (ref 0.0–2.0)
Basophils Absolute: 0 10*3/uL (ref 0.0–0.1)
EOS ABS: 0.3 10*3/uL (ref 0.0–0.5)
EOS%: 5.7 % (ref 0.0–7.0)
HCT: 35.4 % (ref 34.8–46.6)
HGB: 11.7 g/dL (ref 11.6–15.9)
LYMPH%: 12.7 % — ABNORMAL LOW (ref 14.0–49.7)
MCH: 32.1 pg (ref 25.1–34.0)
MCHC: 33.1 g/dL (ref 31.5–36.0)
MCV: 97 fL (ref 79.5–101.0)
MONO#: 0.5 10*3/uL (ref 0.1–0.9)
MONO%: 9.2 % (ref 0.0–14.0)
NEUT#: 3.5 10*3/uL (ref 1.5–6.5)
NEUT%: 71.6 % (ref 38.4–76.8)
Platelets: 169 10*3/uL (ref 145–400)
RBC: 3.65 10*6/uL — ABNORMAL LOW (ref 3.70–5.45)
RDW: 14 % (ref 11.2–14.5)
WBC: 4.9 10*3/uL (ref 3.9–10.3)
lymph#: 0.6 10*3/uL — ABNORMAL LOW (ref 0.9–3.3)

## 2014-12-31 LAB — COMPREHENSIVE METABOLIC PANEL (CC13)
ALT: 11 U/L (ref 0–55)
ANION GAP: 8 meq/L (ref 3–11)
AST: 15 U/L (ref 5–34)
Albumin: 2.9 g/dL — ABNORMAL LOW (ref 3.5–5.0)
Alkaline Phosphatase: 66 U/L (ref 40–150)
BILIRUBIN TOTAL: 1.43 mg/dL — AB (ref 0.20–1.20)
BUN: 16.4 mg/dL (ref 7.0–26.0)
CHLORIDE: 107 meq/L (ref 98–109)
CO2: 26 mEq/L (ref 22–29)
Calcium: 9.2 mg/dL (ref 8.4–10.4)
Creatinine: 1.4 mg/dL — ABNORMAL HIGH (ref 0.6–1.1)
EGFR: 37 mL/min/{1.73_m2} — ABNORMAL LOW (ref >90)
Glucose: 99 mg/dl (ref 70–140)
POTASSIUM: 4 meq/L (ref 3.5–5.1)
Sodium: 141 mEq/L (ref 136–145)
Total Protein: 5.5 g/dL — ABNORMAL LOW (ref 6.4–8.3)

## 2014-12-31 MED ORDER — HEPARIN SOD (PORK) LOCK FLUSH 100 UNIT/ML IV SOLN
500.0000 [IU] | Freq: Once | INTRAVENOUS | Status: AC
  Administered 2014-12-31: 500 [IU] via INTRAVENOUS
  Filled 2014-12-31: qty 5

## 2014-12-31 MED ORDER — SODIUM CHLORIDE 0.9 % IJ SOLN
10.0000 mL | INTRAMUSCULAR | Status: DC | PRN
  Administered 2014-12-31: 10 mL via INTRAVENOUS
  Filled 2014-12-31: qty 10

## 2014-12-31 NOTE — Patient Instructions (Signed)

## 2014-12-31 NOTE — Progress Notes (Signed)
[facility] Telephone:(336) [phone]   Fax:(336) [phone]  OFFICE PROGRESS NOTE  [name], MD [address] [place] [address]  DIAGNOSIS: Recurrent non-small cell lung cancer, adenocarcinoma initially diagnosed as stage IIA in March 2008. The tumor cells were negative for EGFR mutation as well as ALK gene translocation.   PRIOR THERAPY:  1. Status post concurrent chemoradiation with weekly carboplatin and paclitaxel. Last dose was given Apr 12, 2007. 2. Status post 3 cycles of consolidation chemotherapy with docetaxel. Last dose was given July 12, 2007. 3. 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 4. Status post 39 cycles of maintenance Alimta at a dose of 500 mg/m2. Last dose was given February 25, 2010, discontinued secondary to persistent anemia, but the patient has stable disease.  CURRENT THERAPY: Tarceva 150 mg by mouth daily. Status post approximately 29 months of therapy.   CHEMOTHERAPY INTENT: Palliative  CURRENT # OF CHEMOTHERAPY CYCLES: 30 CURRENT ANTIEMETICS: Compazine when necessary  CURRENT SMOKING STATUS: Former smoker quit 11/27/2001  ORAL CHEMOTHERAPY AND CONSENT: Tarceva  CURRENT BISPHOSPHONATES USE: None  PAIN MANAGEMENT: 0/10  NARCOTICS INDUCED CONSTIPATION: None  LIVING WILL AND CODE STATUS: Full code initially but no prolonged resuscitation.    INTERVAL HISTORY: Diane [name] 74 y.o. female returns to the clinic today for routine monthly followup visit. The patient is feeling fine today. She is tolerating her current treatment with Tarceva fairly well except for few scabs on her head which had improved compared to last visit. The skin rash on the face looks much better. She denied having any significant episodes of diarrhea. She has no significant fever or chills no nausea or vomiting. She has occasional pain on the right side of the chest and shortness of  breath with exertion with no cough or hemoptysis. She denied having any significant weight loss or night sweats. She has no nausea or vomiting.   MEDICAL HISTORY: Past Medical History  Diagnosis Date  . Hypertension   . Renal insufficiency     stage 3 renal disease/ pt  . lung ca dx'd 02/2010  . 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  MEDICATIONS:  Current Outpatient Prescriptions  Medication Sig Dispense Refill  . aspirin 81 MG tablet Take 81 mg by mouth daily.      . calcitRIOL (ROCALTROL) 0.25 MCG capsule Take 0.25 mcg by mouth every Monday, Wednesday, and Friday.    . clindamycin (CLEOCIN-T) 1 % external solution Apply topically 2 (two) times daily. Apply as directed 60 mL 1  . erlotinib (TARCEVA) 150 MG tablet Take 1 tablet (150 mg total) by mouth daily. Take on an empty stomach 1 hour before meals or 2 hours after. 30 tablet 2  . labetalol (NORMODYNE) 200 MG tablet Take 200 mg by mouth 2 (two) times daily.      . Melatonin 5 MG CAPS Take 1 capsule by mouth.    . neomycin-polymyxin b-dexamethasone (MAXITROL) 3.5-10000-0.1 SUSP     . polyethylene glycol (MIRALAX / GLYCOLAX) packet Take 17 g by mouth daily.    Marland Kitchen torsemide (DEMADEX) 20 MG tablet Take 20 mg by mouth daily.       No current facility-administered medications for this visit.   Facility-Administered Medications Ordered in Other Visits  Medication Dose Route Frequency Provider Last Rate Last Dose  . sodium chloride 0.9 % injection 10 mL  10 mL Intravenous PRN Curt Bears, MD   10 mL at 10/14/13 1030  . sodium chloride 0.9 % injection 10 mL  10 mL Intravenous PRN Curt Bears, MD   10 mL at 12/31/14 1027    SURGICAL HISTORY:  Past Surgical History  Procedure Laterality Date  . Portacath placement  11/03/08-[facility]    tip in mid SVC-E. [facility]ell    REVIEW OF SYSTEMS:  Constitutional: positive for fatigue Eyes: negative Ears, nose, mouth, throat, and face:  negative Respiratory: positive for dyspnea on exertion Cardiovascular: negative Gastrointestinal: negative Genitourinary:negative Integument/breast: positive for dryness Hematologic/lymphatic: negative Musculoskeletal:negative Neurological: negative Behavioral/Psych: negative Endocrine: negative Allergic/Immunologic: negative   PHYSICAL EXAMINATION: General appearance: alert, cooperative and no distress Head: Normocephalic, without obvious abnormality, atraumatic Neck: no adenopathy, no carotid bruit, no JVD, supple, symmetrical, trachea midline and thyroid not enlarged, symmetric, no tenderness/mass/nodules Lymph nodes: Cervical, supraclavicular, and axillary nodes normal. Resp: clear to auscultation bilaterally and normal percussion bilaterally Back: symmetric, no curvature. ROM normal. No CVA tenderness. Cardio: regular rate and rhythm, S1, S2 normal, no murmur, click, rub or gallop GI: soft, non-tender; bowel sounds normal; no masses,  no organomegaly Extremities: extremities normal, atraumatic, no cyanosis or edema Neurologic: Alert and oriented X 3, normal strength and tone. Normal symmetric reflexes. Normal coordination and gait  ECOG PERFORMANCE STATUS: 1 - Symptomatic but completely ambulatory  Blood pressure 115/59, pulse 75, temperature 98.1 F (36.7 C), resp. rate 18, height $RemoveBe'5\' 6"'[id number]$  (1.676 m), weight 231 lb 14.4 oz (105.189 kg), SpO2 97 %.  LABORATORY DATA: Lab Results  Component Value Date   WBC 4.9 12/31/2014   HGB 11.7 12/31/2014   HCT 35.4 12/31/2014   MCV 97.0 12/31/2014   PLT 169 12/31/2014      Chemistry      Component Value Date/Time   NA 141 12/31/2014 0948   NA 137 11/14/2012 0816   NA 143 07/11/2012 0927   K 4.0 12/31/2014 0948   K 3.9 11/14/2012 0816   K 4.5 07/11/2012 0927   CL 102 05/08/2013 0851   CL 101 11/14/2012 0816   CL 101 07/11/2012 0927   CO2 26 12/31/2014 0948   CO2 28 11/14/2012 0816   CO2 29 07/11/2012 0927   BUN 16.4  12/31/2014 0948   BUN 17 11/14/2012 0816   BUN 16 07/11/2012 0927   CREATININE 1.4* 12/31/2014 0948   CREATININE 1.52* 11/14/2012 0816   CREATININE 1.5* 07/11/2012 0927      Component Value Date/Time   CALCIUM 9.2 12/31/2014 0948   CALCIUM 10.2 11/14/2012 0816   CALCIUM 10.0 07/11/2012 0927   ALKPHOS 66 12/31/2014 0948   ALKPHOS 74 11/14/2012 0816   ALKPHOS 71 07/11/2012 0927   AST 15 12/31/2014 0948   AST 16 11/14/2012 0816   AST 19 07/11/2012 0927   ALT 11 12/31/2014 0948   ALT 8 11/14/2012 0816   ALT 18 07/11/2012 0927   BILITOT 1.43* 12/31/2014 0948   BILITOT 1.2 11/14/2012 0816   BILITOT 0.80 07/11/2012 0927       RADIOGRAPHIC STUDIES:  ASSESSMENT AND PLAN: This is a very pleasant 74 years old white female with metastatic non-small cell lung cancer currently on treatment with Tarceva 150 mg by mouth daily status post 29 months of treatment and tolerating it fairly well except for dry skin and grade 1-2 skin rash on the face and scalp. I discussed the scan results with the patient today. I recommended for the patient to continue her current treatment  with Tarceva 150 mg by mouth daily. She would come back for followup visit in one month for reevaluation with repeat CBC and comprehensive metabolic panel in addition to repeat CT scan of the chest, abdomen and pelvis.  She will continue to have Port-A-Cath flush every 8 weeks. She was advised to call immediately if she has any concerning symptoms in the interval.  The patient voices understanding of current disease status and treatment options and is in agreement with the current care plan.  All questions were answered. The patient knows to call the clinic with any problems, questions or concerns. We can certainly see the patient much sooner if necessary.  Disclaimer: This note was dictated with voice recognition software. Similar sounding words can inadvertently be transcribed and may not be corrected upon review.

## 2014-12-31 NOTE — Telephone Encounter (Signed)
gv and printed appt sched anda vs for pt for March.... °

## 2014-12-31 NOTE — Telephone Encounter (Signed)
gv adn printed appt sched and avs for pt for March

## 2015-01-02 ENCOUNTER — Encounter: Payer: Self-pay | Admitting: Internal Medicine

## 2015-01-26 ENCOUNTER — Other Ambulatory Visit (HOSPITAL_BASED_OUTPATIENT_CLINIC_OR_DEPARTMENT_OTHER): Payer: Medicare Other

## 2015-01-26 ENCOUNTER — Other Ambulatory Visit: Payer: Medicare Other

## 2015-01-26 ENCOUNTER — Encounter (HOSPITAL_COMMUNITY): Payer: Self-pay

## 2015-01-26 ENCOUNTER — Ambulatory Visit (HOSPITAL_COMMUNITY)
Admission: RE | Admit: 2015-01-26 | Discharge: 2015-01-26 | Disposition: A | Discharge status: Home / Self Care | Payer: Medicare Other | Source: Ambulatory Visit | Attending: Internal Medicine | Admitting: Internal Medicine

## 2015-01-26 DIAGNOSIS — Z923 Personal history of irradiation: Secondary | ICD-10-CM | POA: Insufficient documentation

## 2015-01-26 DIAGNOSIS — C3490 Malignant neoplasm of unspecified part of unspecified bronchus or lung: Secondary | ICD-10-CM

## 2015-01-26 DIAGNOSIS — C3491 Malignant neoplasm of unspecified part of right bronchus or lung: Secondary | ICD-10-CM | POA: Diagnosis present

## 2015-01-26 DIAGNOSIS — Z9221 Personal history of antineoplastic chemotherapy: Secondary | ICD-10-CM | POA: Diagnosis not present

## 2015-01-26 LAB — CBC WITH DIFFERENTIAL/PLATELET
BASO%: 0.8 % (ref 0.0–2.0)
Basophils Absolute: 0 10*3/uL (ref 0.0–0.1)
EOS%: 6.2 % (ref 0.0–7.0)
Eosinophils Absolute: 0.3 10*3/uL (ref 0.0–0.5)
HEMATOCRIT: 36.4 % (ref 34.8–46.6)
HEMOGLOBIN: 11.8 g/dL (ref 11.6–15.9)
LYMPH%: 12.9 % — ABNORMAL LOW (ref 14.0–49.7)
MCH: 31.1 pg (ref 25.1–34.0)
MCHC: 32.5 g/dL (ref 31.5–36.0)
MCV: 95.7 fL (ref 79.5–101.0)
MONO#: 0.5 10*3/uL (ref 0.1–0.9)
MONO%: 9.9 % (ref 0.0–14.0)
NEUT%: 70.2 % (ref 38.4–76.8)
NEUTROS ABS: 3.3 10*3/uL (ref 1.5–6.5)
PLATELETS: 190 10*3/uL (ref 145–400)
RBC: 3.8 10*6/uL (ref 3.70–5.45)
RDW: 13.3 % (ref 11.2–14.5)
WBC: 4.7 10*3/uL (ref 3.9–10.3)
lymph#: 0.6 10*3/uL — ABNORMAL LOW (ref 0.9–3.3)

## 2015-01-26 LAB — COMPREHENSIVE METABOLIC PANEL (CC13)
ALT: 8 U/L (ref 0–55)
ANION GAP: 10 meq/L (ref 3–11)
AST: 14 U/L (ref 5–34)
Albumin: 2.9 g/dL — ABNORMAL LOW (ref 3.5–5.0)
Alkaline Phosphatase: 64 U/L (ref 40–150)
BUN: 17.8 mg/dL (ref 7.0–26.0)
CO2: 28 meq/L (ref 22–29)
CREATININE: 1.5 mg/dL — AB (ref 0.6–1.1)
Calcium: 9.8 mg/dL (ref 8.4–10.4)
Chloride: 104 mEq/L (ref 98–109)
EGFR: 34 mL/min/{1.73_m2} — ABNORMAL LOW (ref >90)
Glucose: 82 mg/dl (ref 70–140)
Potassium: 4 mEq/L (ref 3.5–5.1)
Sodium: 143 mEq/L (ref 136–145)
Total Bilirubin: 1.21 mg/dL — ABNORMAL HIGH (ref 0.20–1.20)
Total Protein: 5.6 g/dL — ABNORMAL LOW (ref 6.4–8.3)

## 2015-02-02 ENCOUNTER — Encounter: Payer: Self-pay | Admitting: Internal Medicine

## 2015-02-02 ENCOUNTER — Other Ambulatory Visit: Payer: Self-pay | Admitting: Medical Oncology

## 2015-02-02 ENCOUNTER — Telehealth: Payer: Self-pay | Admitting: *Deleted

## 2015-02-02 ENCOUNTER — Ambulatory Visit (HOSPITAL_BASED_OUTPATIENT_CLINIC_OR_DEPARTMENT_OTHER): Payer: Medicare Other | Admitting: Internal Medicine

## 2015-02-02 ENCOUNTER — Telehealth: Payer: Self-pay | Admitting: Internal Medicine

## 2015-02-02 VITALS — BP 118/94 | HR 82 | Temp 98.4°F | Resp 18 | Ht 66.0 in | Wt 232.1 lb

## 2015-02-02 DIAGNOSIS — R21 Rash and other nonspecific skin eruption: Secondary | ICD-10-CM

## 2015-02-02 DIAGNOSIS — C3491 Malignant neoplasm of unspecified part of right bronchus or lung: Secondary | ICD-10-CM

## 2015-02-02 DIAGNOSIS — C3490 Malignant neoplasm of unspecified part of unspecified bronchus or lung: Secondary | ICD-10-CM

## 2015-02-02 MED ORDER — ERLOTINIB HCL 150 MG PO TABS: 150.0000 mg | ORAL_TABLET | Freq: Every day | ORAL | Status: DC

## 2015-02-02 NOTE — Progress Notes (Signed)
[place] [phone]   Fax:(336) [phone]  OFFICE PROGRESS NOTE  [name], MD [address] [place] [address]  DIAGNOSIS: Recurrent non-small cell lung cancer, adenocarcinoma initially diagnosed as stage IIA in March 2008. The tumor cells were negative for EGFR mutation as well as ALK gene translocation.   PRIOR THERAPY:  1. Status post concurrent chemoradiation with weekly carboplatin and paclitaxel. Last dose was given Apr 12, 2007. 2. Status post 3 cycles of consolidation chemotherapy with docetaxel. Last dose was given July 12, 2007. 3. 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 4. Status post 39 cycles of maintenance Alimta at a dose of 500 mg/m2. Last dose was given February 25, 2010, discontinued secondary to persistent anemia, but the patient has stable disease.  CURRENT THERAPY: Tarceva 150 mg by mouth daily. Status post approximately 30 months of therapy.   CHEMOTHERAPY INTENT: Palliative  CURRENT # OF CHEMOTHERAPY CYCLES: 31 CURRENT ANTIEMETICS: Compazine when necessary  CURRENT SMOKING STATUS: Former smoker quit 11/27/2001  ORAL CHEMOTHERAPY AND CONSENT: Tarceva  CURRENT BISPHOSPHONATES USE: None  PAIN MANAGEMENT: 0/10  NARCOTICS INDUCED CONSTIPATION: None  LIVING WILL AND CODE STATUS: Full code initially but no prolonged resuscitation.   INTERVAL HISTORY: Diane [name] 74 y.o. female returns to the clinic today for routine monthly followup visit. The patient is feeling fine today. She is tolerating her current treatment with Tarceva fairly well except for few scabs on her head which continues to get better. She is in hurry to go to the beach today. She denied having any significant episodes of diarrhea. She has no significant fever or chills no nausea or vomiting. She has occasional pain on the right side of the chest and shortness of breath with  exertion with no cough or hemoptysis. She denied having any significant weight loss or night sweats. She has no nausea or vomiting. She has repeat CT scan of the chest performed recently and she is here for evaluation and discussion of her scan results.  MEDICAL HISTORY: Past Medical History  Diagnosis Date  . Hypertension   . Renal insufficiency     stage 3 renal disease/ pt  . lung ca dx'd 02/2010  . 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  MEDICATIONS:  Current Outpatient Prescriptions  Medication Sig Dispense Refill  . aspirin 81 MG tablet Take 81 mg by mouth daily.      . calcitRIOL (ROCALTROL) 0.25 MCG capsule Take 0.25 mcg by mouth every Monday, Wednesday, and Friday.    . clindamycin (CLEOCIN-T) 1 % external solution Apply topically 2 (two) times daily. Apply as directed 60 mL 1  . erlotinib (TARCEVA) 150 MG tablet Take 1 tablet (150 mg total) by mouth daily. Take on an empty stomach 1 hour before meals or 2 hours after. 30 tablet 2  . labetalol (NORMODYNE) 200 MG tablet Take 200 mg by mouth 2 (two) times daily.      . Melatonin 5 MG CAPS Take 1 capsule by mouth.    . neomycin-polymyxin b-dexamethasone (MAXITROL) 3.5-10000-0.1 SUSP     . polyethylene glycol (MIRALAX / GLYCOLAX) packet Take 17 g by mouth daily.    Marland Kitchen torsemide (DEMADEX) 20 MG tablet Take 20 mg by mouth daily.       No current facility-administered medications for this visit.   Facility-Administered Medications Ordered in Other Visits  Medication Dose Route Frequency Provider Last Rate Last Dose  . sodium chloride 0.9 % injection 10 mL  10 mL Intravenous PRN Curt Bears, MD   10 mL at 10/14/13 1030    SURGICAL HISTORY:  Past Surgical History  Procedure Laterality Date  . Portacath placement  11/03/08-[facility]    tip in mid SVC-E. Mansell    REVIEW OF SYSTEMS:  Constitutional: negative Eyes: negative Ears, nose, mouth, throat, and face:  negative Respiratory: positive for dyspnea on exertion Cardiovascular: negative Gastrointestinal: negative Genitourinary:negative Integument/breast: negative Hematologic/lymphatic: negative Musculoskeletal:negative Neurological: negative Behavioral/Psych: negative Endocrine: negative Allergic/Immunologic: negative   PHYSICAL EXAMINATION: General appearance: alert, cooperative and no distress Head: Normocephalic, without obvious abnormality, atraumatic Neck: no adenopathy, no carotid bruit, no JVD, supple, symmetrical, trachea midline and thyroid not enlarged, symmetric, no tenderness/mass/nodules Lymph nodes: Cervical, supraclavicular, and axillary nodes normal. Resp: clear to auscultation bilaterally and normal percussion bilaterally Back: symmetric, no curvature. ROM normal. No CVA tenderness. Cardio: regular rate and rhythm, S1, S2 normal, no murmur, click, rub or gallop GI: soft, non-tender; bowel sounds normal; no masses,  no organomegaly Extremities: extremities normal, atraumatic, no cyanosis or edema Neurologic: Alert and oriented X 3, normal strength and tone. Normal symmetric reflexes. Normal coordination and gait  ECOG PERFORMANCE STATUS: 1 - Symptomatic but completely ambulatory  Blood pressure 118/94, pulse 82, temperature 98.4 F (36.9 C), temperature source Oral, resp. rate 18, height $RemoveBe'5\' 6"'[id number]$  (1.676 m), weight 232 lb 1.6 oz (105.28 kg), SpO2 97 %.  LABORATORY DATA: Lab Results  Component Value Date   WBC 4.7 01/26/2015   HGB 11.8 01/26/2015   HCT 36.4 01/26/2015   MCV 95.7 01/26/2015   PLT 190 01/26/2015      Chemistry      Component Value Date/Time   NA 143 01/26/2015 0923   NA 137 11/14/2012 0816   NA 143 07/11/2012 0927   K 4.0 01/26/2015 0923   K 3.9 11/14/2012 0816   K 4.5 07/11/2012 0927   CL 102 05/08/2013 0851   CL 101 11/14/2012 0816   CL 101 07/11/2012 0927   CO2 28 01/26/2015 0923   CO2 28 11/14/2012 0816   CO2 29 07/11/2012 0927   BUN  17.8 01/26/2015 0923   BUN 17 11/14/2012 0816   BUN 16 07/11/2012 0927   CREATININE 1.5* 01/26/2015 0923   CREATININE 1.52* 11/14/2012 0816   CREATININE 1.5* 07/11/2012 0927      Component Value Date/Time   CALCIUM 9.8 01/26/2015 0923   CALCIUM 10.2 11/14/2012 0816   CALCIUM 10.0 07/11/2012 0927   ALKPHOS 64 01/26/2015 0923   ALKPHOS 74 11/14/2012 0816   ALKPHOS 71 07/11/2012 0927   AST 14 01/26/2015 0923   AST 16 11/14/2012 0816   AST 19 07/11/2012 0927   ALT 8 01/26/2015 0923   ALT 8 11/14/2012 0816   ALT 18 07/11/2012 0927   BILITOT 1.21* 01/26/2015 0923   BILITOT 1.2 11/14/2012 0816   BILITOT 0.80 07/11/2012 0927       RADIOGRAPHIC STUDIES: Ct Chest Wo Contrast  01/26/2015   CLINICAL DATA:  Lung cancer diagnosed 01/2007, XRT complete 04/2007, chemotherapy complete 2011, tarceva in progress  EXAM: CT CHEST WITHOUT CONTRAST  TECHNIQUE: Multidetector CT imaging of the chest was performed following the standard protocol without IV contrast.  COMPARISON:  10/29/2014  FINDINGS: Mediastinum/Nodes: Heart is top-normal in size. No pericardial effusion.  Left main and 3 vessel coronary atherosclerosis.  Atherosclerotic calcifications aortic arch.  Left chest port terminates  at the cavoatrial junction.  No suspicious mediastinal or axillary lymphadenopathy.  Stable soft tissue fullness in the right perihilar region is unchanged, poorly evaluated on unenhanced CT.  Visualized thyroid is nodular/heterogeneous bilaterally.  Lungs/Pleura: Stable radiation changes in the right perihilar region/central right lower lobe, unchanged. Associated volume loss in the right hemithorax.  Scattered patchy nodularity in the right hemithorax is also unchanged, without discrete suspicious pulmonary nodules.  Left lung is clear.  Underlying mild centrilobular and paraseptal emphysematous changes.  Stable moderate complex right pleural collection with pleural thickening and calcifications (series 2/image 42).  No  pneumothorax.  Upper abdomen: Visualized upper abdomen is notable for vascular calcifications.  Musculoskeletal: Degenerative changes of the visualized thoracolumbar spine.  IMPRESSION: Radiation changes in the right hemithorax with associated chronic right pleural collection.  No evidence of recurrent or metastatic disease.   Electronically Signed   By: Julian Hy M.D.   On: 01/26/2015 11:03    ASSESSMENT AND PLAN: This is a very pleasant 74 years old white female with metastatic non-small cell lung cancer currently on treatment with Tarceva 150 mg by mouth daily status post 30 months of treatment and tolerating it fairly well except for dry skin and grade 1-2 skin rash on the face and scalp. The recent CT scan of the chest showed no evidence for disease progression. I discussed the scan results with the patient today. I recommended for the patient to continue her current treatment with Tarceva 150 mg by mouth daily. She would come back for followup visit in one month for reevaluation with repeat CBC and comprehensive metabolic panel.  She will continue to have Port-A-Cath flush every 8 weeks. She was advised to call immediately if she has any concerning symptoms in the interval.  The patient voices understanding of current disease status and treatment options and is in agreement with the current care plan.  All questions were answered. The patient knows to call the clinic with any problems, questions or concerns. We can certainly see the patient much sooner if necessary.  Disclaimer: This note was dictated with voice recognition software. Similar sounding words can inadvertently be transcribed and may not be corrected upon review.

## 2015-02-02 NOTE — Telephone Encounter (Signed)
Tried to call pt to confirm labs/flush/ov per 03/08 POF, no answer or VM on either contact #'s. Mailed schedule to pt.... KJ

## 2015-02-02 NOTE — Telephone Encounter (Signed)
Received refill request for Tarceva. Taken to Dr [name] desk

## 2015-02-03 ENCOUNTER — Telehealth: Payer: Self-pay | Admitting: *Deleted

## 2015-02-03 NOTE — Telephone Encounter (Signed)
Fax received from [facility]- Referral received, pt will be contacted once insurance has been verified.

## 2015-03-08 ENCOUNTER — Encounter: Payer: Self-pay | Admitting: Internal Medicine

## 2015-03-08 ENCOUNTER — Ambulatory Visit (HOSPITAL_BASED_OUTPATIENT_CLINIC_OR_DEPARTMENT_OTHER): Payer: Medicare Other

## 2015-03-08 ENCOUNTER — Other Ambulatory Visit (HOSPITAL_BASED_OUTPATIENT_CLINIC_OR_DEPARTMENT_OTHER): Payer: Medicare Other

## 2015-03-08 ENCOUNTER — Ambulatory Visit (HOSPITAL_BASED_OUTPATIENT_CLINIC_OR_DEPARTMENT_OTHER): Payer: Medicare Other | Admitting: Internal Medicine

## 2015-03-08 ENCOUNTER — Telehealth: Payer: Self-pay | Admitting: Internal Medicine

## 2015-03-08 VITALS — BP 128/55 | HR 79 | Temp 98.4°F | Resp 18 | Ht 66.0 in | Wt 230.3 lb

## 2015-03-08 DIAGNOSIS — Z95828 Presence of other vascular implants and grafts: Secondary | ICD-10-CM

## 2015-03-08 DIAGNOSIS — C3430 Malignant neoplasm of lower lobe, unspecified bronchus or lung: Secondary | ICD-10-CM | POA: Diagnosis not present

## 2015-03-08 DIAGNOSIS — C3491 Malignant neoplasm of unspecified part of right bronchus or lung: Secondary | ICD-10-CM

## 2015-03-08 LAB — CBC WITH DIFFERENTIAL/PLATELET
BASO%: 0.7 % (ref 0.0–2.0)
BASOS ABS: 0 10*3/uL (ref 0.0–0.1)
EOS%: 5.8 % (ref 0.0–7.0)
Eosinophils Absolute: 0.3 10*3/uL (ref 0.0–0.5)
HCT: 34.5 % — ABNORMAL LOW (ref 34.8–46.6)
HGB: 11.4 g/dL — ABNORMAL LOW (ref 11.6–15.9)
LYMPH%: 9.8 % — ABNORMAL LOW (ref 14.0–49.7)
MCH: 31.2 pg (ref 25.1–34.0)
MCHC: 33.1 g/dL (ref 31.5–36.0)
MCV: 94.3 fL (ref 79.5–101.0)
MONO#: 0.4 10*3/uL (ref 0.1–0.9)
MONO%: 9 % (ref 0.0–14.0)
NEUT%: 74.7 % (ref 38.4–76.8)
NEUTROS ABS: 3.6 10*3/uL (ref 1.5–6.5)
Platelets: 189 10*3/uL (ref 145–400)
RBC: 3.66 10*6/uL — ABNORMAL LOW (ref 3.70–5.45)
RDW: 13.8 % (ref 11.2–14.5)
WBC: 4.9 10*3/uL (ref 3.9–10.3)
lymph#: 0.5 10*3/uL — ABNORMAL LOW (ref 0.9–3.3)

## 2015-03-08 LAB — COMPREHENSIVE METABOLIC PANEL (CC13)
ALT: 10 U/L (ref 0–55)
AST: 16 U/L (ref 5–34)
Albumin: 2.8 g/dL — ABNORMAL LOW (ref 3.5–5.0)
Alkaline Phosphatase: 67 U/L (ref 40–150)
Anion Gap: 7 mEq/L (ref 3–11)
BILIRUBIN TOTAL: 1.51 mg/dL — AB (ref 0.20–1.20)
BUN: 17 mg/dL (ref 7.0–26.0)
CO2: 28 meq/L (ref 22–29)
Calcium: 9.2 mg/dL (ref 8.4–10.4)
Chloride: 106 mEq/L (ref 98–109)
Creatinine: 1.4 mg/dL — ABNORMAL HIGH (ref 0.6–1.1)
EGFR: 38 mL/min/{1.73_m2} — ABNORMAL LOW (ref >90)
GLUCOSE: 101 mg/dL (ref 70–140)
Potassium: 3.7 mEq/L (ref 3.5–5.1)
SODIUM: 141 meq/L (ref 136–145)
Total Protein: 5.5 g/dL — ABNORMAL LOW (ref 6.4–8.3)

## 2015-03-08 MED ORDER — SODIUM CHLORIDE 0.9 % IJ SOLN
10.0000 mL | INTRAMUSCULAR | Status: DC | PRN
  Administered 2015-03-08: 10 mL via INTRAVENOUS
  Filled 2015-03-08: qty 10

## 2015-03-08 MED ORDER — HEPARIN SOD (PORK) LOCK FLUSH 100 UNIT/ML IV SOLN
500.0000 [IU] | Freq: Once | INTRAVENOUS | Status: AC
  Administered 2015-03-08: 500 [IU] via INTRAVENOUS
  Filled 2015-03-08: qty 5

## 2015-03-08 NOTE — Patient Instructions (Signed)

## 2015-03-08 NOTE — Progress Notes (Signed)
[place] Telephone:(336) [phone]   Fax:(336) [phone]  OFFICE PROGRESS NOTE  [name], MD [address] [place] [address]  DIAGNOSIS: Recurrent non-small cell lung cancer, adenocarcinoma initially diagnosed as stage IIA in March 2008. The tumor cells were negative for EGFR mutation as well as ALK gene translocation.   PRIOR THERAPY:  1. Status post concurrent chemoradiation with weekly carboplatin and paclitaxel. Last dose was given Apr 12, 2007. 2. Status post 3 cycles of consolidation chemotherapy with docetaxel. Last dose was given July 12, 2007. 3. 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 4. Status post 39 cycles of maintenance Alimta at a dose of 500 mg/m2. Last dose was given February 25, 2010, discontinued secondary to persistent anemia, but the patient has stable disease.  CURRENT THERAPY: Tarceva 150 mg by mouth daily. Status post approximately 31 months of therapy.   CHEMOTHERAPY INTENT: Palliative  CURRENT # OF CHEMOTHERAPY CYCLES: 32 CURRENT ANTIEMETICS: Compazine when necessary  CURRENT SMOKING STATUS: Former smoker quit 11/27/2001  ORAL CHEMOTHERAPY AND CONSENT: Tarceva  CURRENT BISPHOSPHONATES USE: None  PAIN MANAGEMENT: 0/10  NARCOTICS INDUCED CONSTIPATION: None  LIVING WILL AND CODE STATUS: Full code initially but no prolonged resuscitation.   INTERVAL HISTORY: Diane [name] 74 y.o. female returns to the clinic today for routine monthly followup visit. The patient is feeling fine today. She continues to tolerate her current treatment with Tarceva fairly well except for few scabs on her head and mild skin rash on the face. She has no significant fever or chills no nausea or vomiting. She has occasional pain on the right side of the chest and shortness of breath with exertion with no cough or hemoptysis. She denied having any significant weight loss or night  sweats. She has no nausea or vomiting.   MEDICAL HISTORY: Past Medical History  Diagnosis Date  . Hypertension   . Renal insufficiency     stage 3 renal disease/ pt  . lung ca dx'd 02/2010  . 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  MEDICATIONS:  Current Outpatient Prescriptions  Medication Sig Dispense Refill  . aspirin 81 MG tablet Take 81 mg by mouth daily.      . calcitRIOL (ROCALTROL) 0.25 MCG capsule Take 0.25 mcg by mouth every Monday, Wednesday, and Friday.    . clindamycin (CLEOCIN-T) 1 % external solution Apply topically 2 (two) times daily. Apply as directed 60 mL 1  . erlotinib (TARCEVA) 150 MG tablet Take 1 tablet (150 mg total) by mouth daily. Take on an empty stomach 1 hour before meals or 2 hours after. 30 tablet 2  . labetalol (NORMODYNE) 200 MG tablet Take 200 mg by mouth 2 (two) times daily.      . Melatonin 5 MG CAPS Take 1 capsule by mouth.    . neomycin-polymyxin b-dexamethasone (MAXITROL) 3.5-10000-0.1 SUSP     . polyethylene glycol (MIRALAX / GLYCOLAX) packet Take 17 g by mouth daily.    Marland Kitchen torsemide (DEMADEX) 20 MG tablet Take 20 mg by mouth daily.       No current facility-administered medications for this visit.   Facility-Administered Medications Ordered in Other Visits  Medication Dose Route Frequency Provider Last Rate Last Dose  . sodium chloride 0.9 % injection 10 mL  10 mL Intravenous PRN Curt Bears, MD   10 mL at 10/14/13 1030  SURGICAL HISTORY:  Past Surgical History  Procedure Laterality Date  . Portacath placement  11/03/08-[facility]    tip in mid SVC-E. Mansell    REVIEW OF SYSTEMS:  A comprehensive review of systems was negative except for: Integument/breast: positive for rash   PHYSICAL EXAMINATION: General appearance: alert, cooperative and no distress Head: Normocephalic, without obvious abnormality, atraumatic Neck: no adenopathy, no carotid bruit, no JVD, supple, symmetrical,  trachea midline and thyroid not enlarged, symmetric, no tenderness/mass/nodules Lymph nodes: Cervical, supraclavicular, and axillary nodes normal. Resp: clear to auscultation bilaterally and normal percussion bilaterally Back: symmetric, no curvature. ROM normal. No CVA tenderness. Cardio: regular rate and rhythm, S1, S2 normal, no murmur, click, rub or gallop GI: soft, non-tender; bowel sounds normal; no masses,  no organomegaly Extremities: extremities normal, atraumatic, no cyanosis or edema Neurologic: Alert and oriented X 3, normal strength and tone. Normal symmetric reflexes. Normal coordination and gait  ECOG PERFORMANCE STATUS: 1 - Symptomatic but completely ambulatory  Blood pressure 128/55, pulse 79, temperature 98.4 F (36.9 C), temperature source Oral, resp. rate 18, height $RemoveBe'5\' 6"'[id number]$  (1.676 m), weight 230 lb 4.8 oz (104.463 kg), SpO2 98 %.  LABORATORY DATA: Lab Results  Component Value Date   WBC 4.9 03/08/2015   HGB 11.4* 03/08/2015   HCT 34.5* 03/08/2015   MCV 94.3 03/08/2015   PLT 189 03/08/2015      Chemistry      Component Value Date/Time   NA 141 03/08/2015 0931   NA 137 11/14/2012 0816   NA 143 07/11/2012 0927   K 3.7 03/08/2015 0931   K 3.9 11/14/2012 0816   K 4.5 07/11/2012 0927   CL 102 05/08/2013 0851   CL 101 11/14/2012 0816   CL 101 07/11/2012 0927   CO2 28 03/08/2015 0931   CO2 28 11/14/2012 0816   CO2 29 07/11/2012 0927   BUN 17.0 03/08/2015 0931   BUN 17 11/14/2012 0816   BUN 16 07/11/2012 0927   CREATININE 1.4* 03/08/2015 0931   CREATININE 1.52* 11/14/2012 0816   CREATININE 1.5* 07/11/2012 0927      Component Value Date/Time   CALCIUM 9.2 03/08/2015 0931   CALCIUM 10.2 11/14/2012 0816   CALCIUM 10.0 07/11/2012 0927   ALKPHOS 67 03/08/2015 0931   ALKPHOS 74 11/14/2012 0816   ALKPHOS 71 07/11/2012 0927   AST 16 03/08/2015 0931   AST 16 11/14/2012 0816   AST 19 07/11/2012 0927   ALT 10 03/08/2015 0931   ALT 8 11/14/2012 0816   ALT 18  07/11/2012 0927   BILITOT 1.51* 03/08/2015 0931   BILITOT 1.2 11/14/2012 0816   BILITOT 0.80 07/11/2012 0927       RADIOGRAPHIC STUDIES: No results found.  ASSESSMENT AND PLAN: This is a very pleasant 74 years old white female with metastatic non-small cell lung cancer currently on treatment with Tarceva 150 mg by mouth daily status post 31 months of treatment and tolerating it fairly well except for dry skin and grade 1-2 skin rash on the face and scalp. Her CBC and comprehensive metabolic panel are stable today. I recommended for the patient to continue her current treatment with Tarceva 150 mg by mouth daily. She would come back for followup visit in one month for reevaluation with repeat CBC and comprehensive metabolic panel.  She will continue to have Port-A-Cath flush every 8 weeks. She was advised to call immediately if she has any concerning symptoms in the interval.  The patient voices understanding of current disease status  and treatment options and is in agreement with the current care plan.  All questions were answered. The patient knows to call the clinic with any problems, questions or concerns. We can certainly see the patient much sooner if necessary.  Disclaimer: This note was dictated with voice recognition software. Similar sounding words can inadvertently be transcribed and may not be corrected upon review.

## 2015-04-07 ENCOUNTER — Telehealth: Payer: Self-pay | Admitting: Internal Medicine

## 2015-04-07 ENCOUNTER — Ambulatory Visit (HOSPITAL_BASED_OUTPATIENT_CLINIC_OR_DEPARTMENT_OTHER): Payer: Medicare Other | Admitting: Internal Medicine

## 2015-04-07 ENCOUNTER — Other Ambulatory Visit (HOSPITAL_BASED_OUTPATIENT_CLINIC_OR_DEPARTMENT_OTHER): Payer: Medicare Other

## 2015-04-07 ENCOUNTER — Encounter: Payer: Self-pay | Admitting: Internal Medicine

## 2015-04-07 VITALS — BP 138/52 | HR 81 | Temp 97.5°F | Resp 18 | Ht 66.0 in | Wt 228.7 lb

## 2015-04-07 DIAGNOSIS — Z87891 Personal history of nicotine dependence: Secondary | ICD-10-CM | POA: Diagnosis not present

## 2015-04-07 DIAGNOSIS — R51 Headache: Secondary | ICD-10-CM | POA: Diagnosis not present

## 2015-04-07 DIAGNOSIS — R21 Rash and other nonspecific skin eruption: Secondary | ICD-10-CM

## 2015-04-07 DIAGNOSIS — C3490 Malignant neoplasm of unspecified part of unspecified bronchus or lung: Secondary | ICD-10-CM

## 2015-04-07 DIAGNOSIS — C3491 Malignant neoplasm of unspecified part of right bronchus or lung: Secondary | ICD-10-CM

## 2015-04-07 LAB — COMPREHENSIVE METABOLIC PANEL (CC13)
ALK PHOS: 74 U/L (ref 40–150)
ALT: 12 U/L (ref 0–55)
AST: 18 U/L (ref 5–34)
Albumin: 2.9 g/dL — ABNORMAL LOW (ref 3.5–5.0)
Anion Gap: 9 mEq/L (ref 3–11)
BUN: 18.6 mg/dL (ref 7.0–26.0)
CHLORIDE: 104 meq/L (ref 98–109)
CO2: 29 mEq/L (ref 22–29)
CREATININE: 1.4 mg/dL — AB (ref 0.6–1.1)
Calcium: 9.3 mg/dL (ref 8.4–10.4)
EGFR: 37 mL/min/{1.73_m2} — ABNORMAL LOW (ref >90)
Glucose: 57 mg/dl — ABNORMAL LOW (ref 70–140)
Potassium: 3.8 mEq/L (ref 3.5–5.1)
Sodium: 142 mEq/L (ref 136–145)
Total Bilirubin: 1.13 mg/dL (ref 0.20–1.20)
Total Protein: 5.8 g/dL — ABNORMAL LOW (ref 6.4–8.3)

## 2015-04-07 LAB — CBC WITH DIFFERENTIAL/PLATELET
BASO%: 0.9 % (ref 0.0–2.0)
BASOS ABS: 0 10*3/uL (ref 0.0–0.1)
EOS%: 6.1 % (ref 0.0–7.0)
Eosinophils Absolute: 0.3 10*3/uL (ref 0.0–0.5)
HEMATOCRIT: 36.9 % (ref 34.8–46.6)
HEMOGLOBIN: 12.4 g/dL (ref 11.6–15.9)
LYMPH%: 12.9 % — AB (ref 14.0–49.7)
MCH: 31.7 pg (ref 25.1–34.0)
MCHC: 33.5 g/dL (ref 31.5–36.0)
MCV: 94.8 fL (ref 79.5–101.0)
MONO#: 0.5 10*3/uL (ref 0.1–0.9)
MONO%: 10 % (ref 0.0–14.0)
NEUT#: 3.2 10*3/uL (ref 1.5–6.5)
NEUT%: 70.1 % (ref 38.4–76.8)
PLATELETS: 182 10*3/uL (ref 145–400)
RBC: 3.9 10*6/uL (ref 3.70–5.45)
RDW: 14.4 % (ref 11.2–14.5)
WBC: 4.5 10*3/uL (ref 3.9–10.3)
lymph#: 0.6 10*3/uL — ABNORMAL LOW (ref 0.9–3.3)

## 2015-04-07 NOTE — Telephone Encounter (Signed)
Gave and printed appt sched and avs for pt for JUNE °

## 2015-04-07 NOTE — Progress Notes (Signed)
[place] [phone]   Fax:([phone]  OFFICE PROGRESS NOTE  [name], MD [address] [place] [address]  DIAGNOSIS: Recurrent non-small cell lung cancer, adenocarcinoma initially diagnosed as stage IIA in March 2008. The tumor cells were negative for EGFR mutation as well as ALK gene translocation.   PRIOR THERAPY:  1. Status post concurrent chemoradiation with weekly carboplatin and paclitaxel. Last dose was given Apr 12, 2007. 2. Status post 3 cycles of consolidation chemotherapy with docetaxel. Last dose was given July 12, 2007. 3. 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 4. Status post 39 cycles of maintenance Alimta at a dose of 500 mg/m2. Last dose was given February 25, 2010, discontinued secondary to persistent anemia, but the patient has stable disease.  CURRENT THERAPY: Tarceva 150 mg by mouth daily. Status post approximately 31 months of therapy.   CHEMOTHERAPY INTENT: Palliative  CURRENT # OF CHEMOTHERAPY CYCLES: 32 CURRENT ANTIEMETICS: Compazine when necessary  CURRENT SMOKING STATUS: Former smoker quit 11/27/2001  ORAL CHEMOTHERAPY AND CONSENT: Tarceva  CURRENT BISPHOSPHONATES USE: None  PAIN MANAGEMENT: 0/10  NARCOTICS INDUCED CONSTIPATION: None  LIVING WILL AND CODE STATUS: Full code initially but no prolonged resuscitation.   INTERVAL HISTORY: Diane [name] 74 y.o. female returns to the clinic today for routine monthly followup visit. The patient is feeling fine today with no specific complaints except for intermittent feeling of electric shock in her head started few days ago. Her last MRI of the brain was several years ago. She continues to tolerate her current treatment with Tarceva fairly well except for few scabs on her head and mild skin rash on the face. She has no significant fever or chills no nausea or vomiting. She has occasional  pain on the right side of the chest and shortness of breath with exertion with no cough or hemoptysis. She denied having any significant weight loss or night sweats. She has no nausea or vomiting.   MEDICAL HISTORY: Past Medical History  Diagnosis Date  . Hypertension   . Renal insufficiency     stage 3 renal disease/ pt  . lung ca dx'd 02/2010  . 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  MEDICATIONS:  Current Outpatient Prescriptions  Medication Sig Dispense Refill  . aspirin 81 MG tablet Take 81 mg by mouth daily.      . calcitRIOL (ROCALTROL) 0.25 MCG capsule Take 0.25 mcg by mouth every Monday, Wednesday, and Friday.    . clindamycin (CLEOCIN-T) 1 % external solution Apply topically 2 (two) times daily. Apply as directed 60 mL 1  . erlotinib (TARCEVA) 150 MG tablet Take 1 tablet (150 mg total) by mouth daily. Take on an empty stomach 1 hour before meals or 2 hours after. 30 tablet 2  . labetalol (NORMODYNE) 200 MG tablet Take 200 mg by mouth 2 (two) times daily.      . Melatonin 5 MG CAPS Take 1 capsule by mouth.    . neomycin-polymyxin b-dexamethasone (MAXITROL) 3.5-10000-0.1 SUSP Place 1 drop into both eyes.     . polyethylene glycol (MIRALAX / GLYCOLAX) packet Take 17 g by mouth daily.    Marland Kitchen torsemide (DEMADEX) 20 MG tablet Take 20 mg by mouth daily.       No current facility-administered medications for this visit.   Facility-Administered Medications Ordered in Other Visits  Medication  Dose Route Frequency Provider Last Rate Last Dose  . sodium chloride 0.9 % injection 10 mL  10 mL Intravenous PRN Curt Bears, MD   10 mL at 10/14/13 1030    SURGICAL HISTORY:  Past Surgical History  Procedure Laterality Date  . Portacath placement  11/03/08-[facility]    tip in mid SVC-E. Mansell    REVIEW OF SYSTEMS:  A comprehensive review of systems was negative except for: Integument/breast: positive for rash Neurological: positive for  headaches   PHYSICAL EXAMINATION: General appearance: alert, cooperative and no distress Head: Normocephalic, without obvious abnormality, atraumatic Neck: no adenopathy, no carotid bruit, no JVD, supple, symmetrical, trachea midline and thyroid not enlarged, symmetric, no tenderness/mass/nodules Lymph nodes: Cervical, supraclavicular, and axillary nodes normal. Resp: clear to auscultation bilaterally and normal percussion bilaterally Back: symmetric, no curvature. ROM normal. No CVA tenderness. Cardio: regular rate and rhythm, S1, S2 normal, no murmur, click, rub or gallop GI: soft, non-tender; bowel sounds normal; no masses,  no organomegaly Extremities: extremities normal, atraumatic, no cyanosis or edema Neurologic: Alert and oriented X 3, normal strength and tone. Normal symmetric reflexes. Normal coordination and gait  ECOG PERFORMANCE STATUS: 1 - Symptomatic but completely ambulatory  Blood pressure 138/52, pulse 81, temperature 97.5 F (36.4 C), temperature source Oral, resp. rate 18, height $RemoveBe'5\' 6"'ifWodOyFl$  (1.676 m), weight 228 lb 11.2 oz (103.738 kg), SpO2 97 %.  LABORATORY DATA: Lab Results  Component Value Date   WBC 4.5 04/07/2015   HGB 12.4 04/07/2015   HCT 36.9 04/07/2015   MCV 94.8 04/07/2015   PLT 182 04/07/2015      Chemistry      Component Value Date/Time   NA 142 04/07/2015 0922   NA 137 11/14/2012 0816   NA 143 07/11/2012 0927   K 3.8 04/07/2015 0922   K 3.9 11/14/2012 0816   K 4.5 07/11/2012 0927   CL 102 05/08/2013 0851   CL 101 11/14/2012 0816   CL 101 07/11/2012 0927   CO2 29 04/07/2015 0922   CO2 28 11/14/2012 0816   CO2 29 07/11/2012 0927   BUN 18.6 04/07/2015 0922   BUN 17 11/14/2012 0816   BUN 16 07/11/2012 0927   CREATININE 1.4* 04/07/2015 0922   CREATININE 1.52* 11/14/2012 0816   CREATININE 1.5* 07/11/2012 0927      Component Value Date/Time   CALCIUM 9.3 04/07/2015 0922   CALCIUM 10.2 11/14/2012 0816   CALCIUM 10.0 07/11/2012 0927   ALKPHOS  74 04/07/2015 0922   ALKPHOS 74 11/14/2012 0816   ALKPHOS 71 07/11/2012 0927   AST 18 04/07/2015 0922   AST 16 11/14/2012 0816   AST 19 07/11/2012 0927   ALT 12 04/07/2015 0922   ALT 8 11/14/2012 0816   ALT 18 07/11/2012 0927   BILITOT 1.13 04/07/2015 0922   BILITOT 1.2 11/14/2012 0816   BILITOT 0.80 07/11/2012 0927       RADIOGRAPHIC STUDIES: No results found.  ASSESSMENT AND PLAN: This is a very pleasant 74 years old white female with metastatic non-small cell lung cancer currently on treatment with Tarceva 150 mg by mouth daily status post 31 months of treatment and tolerating it fairly well except for dry skin and grade 1-2 skin rash on the face and scalp. Her CBC and comprehensive metabolic panel are stable today. I recommended for the patient to continue her current treatment with Tarceva 150 mg by mouth daily. She would come back for followup visit in one month for reevaluation with repeat CBC  and comprehensive metabolic panel as well as repeat CT scan of the chest, abdomen and pelvis without contrast.  For the pain in her headache, I will order MRI of the brain to rule out any brain metastasis. She will continue to have Port-A-Cath flush every 8 weeks. She was advised to call immediately if she has any concerning symptoms in the interval.  The patient voices understanding of current disease status and treatment options and is in agreement with the current care plan.  All questions were answered. The patient knows to call the clinic with any problems, questions or concerns. We can certainly see the patient much sooner if necessary.  Disclaimer: This note was dictated with voice recognition software. Similar sounding words can inadvertently be transcribed and may not be corrected upon review.

## 2015-04-08 ENCOUNTER — Telehealth: Payer: Self-pay | Admitting: Internal Medicine

## 2015-04-08 NOTE — Telephone Encounter (Signed)
returned call adn s.w. pt and confirm flush added for 6.14...Marland Kitchenpt ok adn aware

## 2015-04-12 ENCOUNTER — Ambulatory Visit (HOSPITAL_COMMUNITY)
Admission: RE | Admit: 2015-04-12 | Discharge: 2015-04-12 | Disposition: A | Discharge status: Home / Self Care | Payer: Medicare Other | Source: Ambulatory Visit | Attending: Internal Medicine | Admitting: Internal Medicine

## 2015-04-12 ENCOUNTER — Other Ambulatory Visit: Payer: Self-pay | Admitting: Internal Medicine

## 2015-04-12 DIAGNOSIS — C3491 Malignant neoplasm of unspecified part of right bronchus or lung: Secondary | ICD-10-CM

## 2015-04-12 DIAGNOSIS — Z87891 Personal history of nicotine dependence: Secondary | ICD-10-CM | POA: Diagnosis not present

## 2015-04-12 DIAGNOSIS — N289 Disorder of kidney and ureter, unspecified: Secondary | ICD-10-CM | POA: Diagnosis not present

## 2015-04-12 DIAGNOSIS — R51 Headache: Secondary | ICD-10-CM | POA: Diagnosis not present

## 2015-05-05 ENCOUNTER — Other Ambulatory Visit: Payer: Self-pay | Admitting: Medical Oncology

## 2015-05-05 DIAGNOSIS — C3490 Malignant neoplasm of unspecified part of unspecified bronchus or lung: Secondary | ICD-10-CM

## 2015-05-05 MED ORDER — ERLOTINIB HCL 150 MG PO TABS: 150.0000 mg | ORAL_TABLET | Freq: Every day | ORAL | Status: DC

## 2015-05-05 NOTE — Progress Notes (Addendum)
[facility] faxed tarceva refill request.  Request to provider's desk/in-basket for review. 05-06-2015 [facility] faxed confirmation of facsimile receipt for Tarceva prescription referral.  [facility] will verify insurance and make delivery arrangements with patient.

## 2015-05-07 ENCOUNTER — Encounter (HOSPITAL_COMMUNITY): Payer: Self-pay

## 2015-05-07 ENCOUNTER — Ambulatory Visit (HOSPITAL_COMMUNITY)
Admission: RE | Admit: 2015-05-07 | Discharge: 2015-05-07 | Disposition: A | Discharge status: Home / Self Care | Payer: Medicare Other | Source: Ambulatory Visit | Attending: Internal Medicine | Admitting: Internal Medicine

## 2015-05-07 DIAGNOSIS — Z79899 Other long term (current) drug therapy: Secondary | ICD-10-CM | POA: Insufficient documentation

## 2015-05-07 DIAGNOSIS — C3491 Malignant neoplasm of unspecified part of right bronchus or lung: Secondary | ICD-10-CM

## 2015-05-07 DIAGNOSIS — R933 Abnormal findings on diagnostic imaging of other parts of digestive tract: Secondary | ICD-10-CM | POA: Insufficient documentation

## 2015-05-07 DIAGNOSIS — Z85858 Personal history of malignant neoplasm of other endocrine glands: Secondary | ICD-10-CM | POA: Diagnosis not present

## 2015-05-07 DIAGNOSIS — Z08 Encounter for follow-up examination after completed treatment for malignant neoplasm: Secondary | ICD-10-CM | POA: Diagnosis not present

## 2015-05-07 MED ORDER — IOHEXOL 300 MG/ML  SOLN
50.0000 mL | Freq: Once | INTRAMUSCULAR | Status: AC | PRN
Start: 2015-05-07 — End: 2015-05-07
  Administered 2015-05-07: 50 mL via ORAL

## 2015-05-11 ENCOUNTER — Encounter: Payer: Self-pay | Admitting: Internal Medicine

## 2015-05-11 ENCOUNTER — Ambulatory Visit (HOSPITAL_BASED_OUTPATIENT_CLINIC_OR_DEPARTMENT_OTHER): Payer: Medicare Other | Admitting: Internal Medicine

## 2015-05-11 ENCOUNTER — Ambulatory Visit (HOSPITAL_BASED_OUTPATIENT_CLINIC_OR_DEPARTMENT_OTHER): Payer: Medicare Other

## 2015-05-11 ENCOUNTER — Other Ambulatory Visit (HOSPITAL_BASED_OUTPATIENT_CLINIC_OR_DEPARTMENT_OTHER): Payer: Medicare Other

## 2015-05-11 ENCOUNTER — Telehealth: Payer: Self-pay | Admitting: Internal Medicine

## 2015-05-11 VITALS — BP 122/54 | HR 83 | Temp 98.2°F | Resp 19 | Ht 66.0 in | Wt 228.0 lb

## 2015-05-11 DIAGNOSIS — R21 Rash and other nonspecific skin eruption: Secondary | ICD-10-CM

## 2015-05-11 DIAGNOSIS — Z5111 Encounter for antineoplastic chemotherapy: Secondary | ICD-10-CM

## 2015-05-11 DIAGNOSIS — C3491 Malignant neoplasm of unspecified part of right bronchus or lung: Secondary | ICD-10-CM

## 2015-05-11 DIAGNOSIS — C3430 Malignant neoplasm of lower lobe, unspecified bronchus or lung: Secondary | ICD-10-CM

## 2015-05-11 DIAGNOSIS — Z95828 Presence of other vascular implants and grafts: Secondary | ICD-10-CM

## 2015-05-11 LAB — COMPREHENSIVE METABOLIC PANEL (CC13)
ALT: 10 U/L (ref 0–55)
AST: 13 U/L (ref 5–34)
Albumin: 2.7 g/dL — ABNORMAL LOW (ref 3.5–5.0)
Alkaline Phosphatase: 63 U/L (ref 40–150)
Anion Gap: 9 meq/L (ref 3–11)
BUN: 18.6 mg/dL (ref 7.0–26.0)
CO2: 28 meq/L (ref 22–29)
Calcium: 9.5 mg/dL (ref 8.4–10.4)
Chloride: 104 meq/L (ref 98–109)
Creatinine: 1.6 mg/dL — ABNORMAL HIGH (ref 0.6–1.1)
EGFR: 32 mL/min/{1.73_m2} — ABNORMAL LOW
Glucose: 85 mg/dL (ref 70–140)
Potassium: 3.9 meq/L (ref 3.5–5.1)
Sodium: 140 meq/L (ref 136–145)
Total Bilirubin: 1.03 mg/dL (ref 0.20–1.20)
Total Protein: 5.7 g/dL — ABNORMAL LOW (ref 6.4–8.3)

## 2015-05-11 LAB — CBC WITH DIFFERENTIAL/PLATELET
BASO%: 0.5 % (ref 0.0–2.0)
Basophils Absolute: 0 10*3/uL (ref 0.0–0.1)
EOS%: 5.6 % (ref 0.0–7.0)
Eosinophils Absolute: 0.3 10*3/uL (ref 0.0–0.5)
HCT: 35.3 % (ref 34.8–46.6)
HGB: 11.5 g/dL — ABNORMAL LOW (ref 11.6–15.9)
LYMPH%: 9.7 % — ABNORMAL LOW (ref 14.0–49.7)
MCH: 31.4 pg (ref 25.1–34.0)
MCHC: 32.6 g/dL (ref 31.5–36.0)
MCV: 96.4 fL (ref 79.5–101.0)
MONO#: 0.6 10*3/uL (ref 0.1–0.9)
MONO%: 10.6 % (ref 0.0–14.0)
NEUT#: 4.3 10*3/uL (ref 1.5–6.5)
NEUT%: 73.6 % (ref 38.4–76.8)
Platelets: 203 10*3/uL (ref 145–400)
RBC: 3.66 10*6/uL — ABNORMAL LOW (ref 3.70–5.45)
RDW: 13.6 % (ref 11.2–14.5)
WBC: 5.9 10*3/uL (ref 3.9–10.3)
lymph#: 0.6 10*3/uL — ABNORMAL LOW (ref 0.9–3.3)

## 2015-05-11 MED ORDER — HEPARIN SOD (PORK) LOCK FLUSH 100 UNIT/ML IV SOLN
500.0000 [IU] | Freq: Once | INTRAVENOUS | Status: AC
Start: 2015-05-11 — End: 2015-05-11
  Administered 2015-05-11: 500 [IU] via INTRAVENOUS
  Filled 2015-05-11: qty 5

## 2015-05-11 MED ORDER — SODIUM CHLORIDE 0.9 % IJ SOLN
10.0000 mL | INTRAMUSCULAR | Status: DC | PRN
  Administered 2015-05-11: 10 mL via INTRAVENOUS
  Filled 2015-05-11: qty 10

## 2015-05-11 NOTE — Telephone Encounter (Signed)
per pof to sch pt appt-gave pt copy of avs °

## 2015-05-11 NOTE — Progress Notes (Signed)
[place] [phone]   Fax:(336) [phone]  OFFICE PROGRESS NOTE  [name], MD [address] [place] [address]  DIAGNOSIS: Recurrent non-small cell lung cancer, adenocarcinoma initially diagnosed as stage IIA in March 2008. The tumor cells were negative for EGFR mutation as well as ALK gene translocation.   PRIOR THERAPY:  1. Status post concurrent chemoradiation with weekly carboplatin and paclitaxel. Last dose was given Apr 12, 2007. 2. Status post 3 cycles of consolidation chemotherapy with docetaxel. Last dose was given July 12, 2007. 3. 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 4. Status post 39 cycles of maintenance Alimta at a dose of 500 mg/m2. Last dose was given February 25, 2010, discontinued secondary to persistent anemia, but the patient has stable disease.  CURRENT THERAPY: Tarceva 150 mg by mouth daily. Status post approximately 32 months of therapy.   CHEMOTHERAPY INTENT: Palliative  CURRENT # OF CHEMOTHERAPY CYCLES: 33 CURRENT ANTIEMETICS: Compazine when necessary  CURRENT SMOKING STATUS: Former smoker quit 11/27/2001  ORAL CHEMOTHERAPY AND CONSENT: Tarceva  CURRENT BISPHOSPHONATES USE: None  PAIN MANAGEMENT: 0/10  NARCOTICS INDUCED CONSTIPATION: None  LIVING WILL AND CODE STATUS: Full code initially but no prolonged resuscitation.   INTERVAL HISTORY: Diane [name] 74 y.o. female returns to the clinic today for routine monthly followup visit. The patient is feeling fine today with no specific complaints. She continues to tolerate her current treatment with Tarceva fairly well except for few scabs on her head with occasional oozing and mild skin rash on the face. She has no significant fever or chills no nausea or vomiting. She has occasional pain on the right side of the chest and shortness of breath with exertion with no cough or hemoptysis. She  denied having any significant weight loss or night sweats. She has no nausea or vomiting. She had a recent MRI of the brain as well as CT scan of the chest, abdomen and pelvis performed and she is here for evaluation and discussion of her scan results.  MEDICAL HISTORY: Past Medical History  Diagnosis Date  . Hypertension   . Renal insufficiency     stage 3 renal disease/ pt  . lung ca dx'd 02/2010  . 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  MEDICATIONS:  Current Outpatient Prescriptions  Medication Sig Dispense Refill  . aspirin 81 MG tablet Take 81 mg by mouth daily.      . calcitRIOL (ROCALTROL) 0.25 MCG capsule Take 0.25 mcg by mouth every Monday, Wednesday, and Friday.    . clindamycin (CLEOCIN-T) 1 % external solution Apply topically 2 (two) times daily. Apply as directed 60 mL 1  . erlotinib (TARCEVA) 150 MG tablet Take 1 tablet (150 mg total) by mouth daily. Take on an empty stomach 1 hour before meals or 2 hours after. 30 tablet 2  . labetalol (NORMODYNE) 200 MG tablet Take 200 mg by mouth 2 (two) times daily.      . Melatonin 5 MG CAPS Take 1 capsule by mouth.    . neomycin-polymyxin b-dexamethasone (MAXITROL) 3.5-10000-0.1 SUSP Place 1 drop into both eyes.     . polyethylene glycol (MIRALAX / GLYCOLAX) packet Take 17 g by mouth daily.    Marland Kitchen torsemide (DEMADEX) 20 MG tablet Take 20 mg by mouth daily.       No current facility-administered medications for this visit.  Facility-Administered Medications Ordered in Other Visits  Medication Dose Route Frequency Provider Last Rate Last Dose  . sodium chloride 0.9 % injection 10 mL  10 mL Intravenous PRN Curt Bears, MD   10 mL at 10/14/13 1030    SURGICAL HISTORY:  Past Surgical History  Procedure Laterality Date  . Portacath placement  11/03/08-[facility]    tip in mid SVC-E. Mansell    REVIEW OF SYSTEMS:  Constitutional: negative Eyes: negative Ears, nose, mouth, throat,  and face: negative Respiratory: negative Cardiovascular: negative Gastrointestinal: negative Genitourinary:negative Integument/breast: positive for dryness and rash Hematologic/lymphatic: negative Musculoskeletal:negative Neurological: negative Behavioral/Psych: negative Endocrine: negative Allergic/Immunologic: negative   PHYSICAL EXAMINATION: General appearance: alert, cooperative and no distress Head: Normocephalic, without obvious abnormality, atraumatic Neck: no adenopathy, no carotid bruit, no JVD, supple, symmetrical, trachea midline and thyroid not enlarged, symmetric, no tenderness/mass/nodules Lymph nodes: Cervical, supraclavicular, and axillary nodes normal. Resp: clear to auscultation bilaterally and normal percussion bilaterally Back: symmetric, no curvature. ROM normal. No CVA tenderness. Cardio: regular rate and rhythm, S1, S2 normal, no murmur, click, rub or gallop GI: soft, non-tender; bowel sounds normal; no masses,  no organomegaly Extremities: extremities normal, atraumatic, no cyanosis or edema Neurologic: Alert and oriented X 3, normal strength and tone. Normal symmetric reflexes. Normal coordination and gait  ECOG PERFORMANCE STATUS: 1 - Symptomatic but completely ambulatory  Blood pressure 122/54, pulse 83, temperature 98.2 F (36.8 C), temperature source Oral, resp. rate 19, height $RemoveBe'5\' 6"'wNAneNTYu$  (1.676 m), weight 228 lb (103.42 kg), SpO2 97 %.  LABORATORY DATA: Lab Results  Component Value Date   WBC 5.9 05/11/2015   HGB 11.5* 05/11/2015   HCT 35.3 05/11/2015   MCV 96.4 05/11/2015   PLT 203 05/11/2015      Chemistry      Component Value Date/Time   NA 142 04/07/2015 0922   NA 137 11/14/2012 0816   NA 143 07/11/2012 0927   K 3.8 04/07/2015 0922   K 3.9 11/14/2012 0816   K 4.5 07/11/2012 0927   CL 102 05/08/2013 0851   CL 101 11/14/2012 0816   CL 101 07/11/2012 0927   CO2 29 04/07/2015 0922   CO2 28 11/14/2012 0816   CO2 29 07/11/2012 0927   BUN  18.6 04/07/2015 0922   BUN 17 11/14/2012 0816   BUN 16 07/11/2012 0927   CREATININE 1.4* 04/07/2015 0922   CREATININE 1.52* 11/14/2012 0816   CREATININE 1.5* 07/11/2012 0927      Component Value Date/Time   CALCIUM 9.3 04/07/2015 0922   CALCIUM 10.2 11/14/2012 0816   CALCIUM 10.0 07/11/2012 0927   ALKPHOS 74 04/07/2015 0922   ALKPHOS 74 11/14/2012 0816   ALKPHOS 71 07/11/2012 0927   AST 18 04/07/2015 0922   AST 16 11/14/2012 0816   AST 19 07/11/2012 0927   ALT 12 04/07/2015 0922   ALT 8 11/14/2012 0816   ALT 18 07/11/2012 0927   BILITOT 1.13 04/07/2015 0922   BILITOT 1.2 11/14/2012 0816   BILITOT 0.80 07/11/2012 0927       RADIOGRAPHIC STUDIES: Ct Abdomen Pelvis Wo Contrast  05/07/2015   CLINICAL DATA:  Right lung cancer originally diagnosed him March 2008. Still taking Tarceva. Parotid cancer remotely.  EXAM: CT CHEST, ABDOMEN AND PELVIS WITHOUT CONTRAST  TECHNIQUE: Multidetector CT imaging of the chest, abdomen and pelvis was performed following the standard protocol without IV contrast.  COMPARISON:  Multiple exams, including 01/26/2015 an 08/10/2014  FINDINGS: CT CHEST FINDINGS  Mediastinum/Nodes: Coronary, aortic arch, and  branch vessel atherosclerotic vascular disease. Left Port-A-Cath tip: SVC. Mild cardiomegaly. Chronic right hilar and perihilar soft tissue density compatible with radiation therapy.  Lungs/Pleura: Stable right perihilar density and airway thickening, compatible with radiation pneumonitis/ fibrosis and chronic volume loss. Patchy ground-glass opacities in the right lung are unchanged. Loculated complex right pleural effusion is chronic. These various findings appear similar back through at least 2013 and accordingly are thought to be benign.  Musculoskeletal: Thoracic spondylosis and degenerative disc disease.  CT ABDOMEN PELVIS FINDINGS  Hepatobiliary: Unremarkable  Pancreas: Unremarkable  Spleen: Unremarkable  Adrenals/Urinary Tract: Unremarkable  Stomach/Bowel:  Scattered air-fluid levels in nondilated loops of jejunum.  Vascular/Lymphatic: Aortoiliac atherosclerotic vascular disease.  Reproductive: Unremarkable  Other: No supplemental non-categorized findings.  Musculoskeletal: Mild lumbar spondylosis and degenerative disc disease.  IMPRESSION: 1. Stable post therapy findings in the right thorax. No recurrent malignancy identified. 2. Coronary, aortic arch, and branch vessel atherosclerotic vascular disease. 3. Scattered air-fluid levels in nondilated loops of jejunum-low-grade proximal enteritis not excluded.   Electronically Signed   By: Van Clines M.D.   On: 05/07/2015 10:54   Ct Chest Wo Contrast  05/07/2015   CLINICAL DATA:  Right lung cancer originally diagnosed him March 2008. Still taking Tarceva. Parotid cancer remotely.  EXAM: CT CHEST, ABDOMEN AND PELVIS WITHOUT CONTRAST  TECHNIQUE: Multidetector CT imaging of the chest, abdomen and pelvis was performed following the standard protocol without IV contrast.  COMPARISON:  Multiple exams, including 01/26/2015 an 08/10/2014  FINDINGS: CT CHEST FINDINGS  Mediastinum/Nodes: Coronary, aortic arch, and branch vessel atherosclerotic vascular disease. Left Port-A-Cath tip: SVC. Mild cardiomegaly. Chronic right hilar and perihilar soft tissue density compatible with radiation therapy.  Lungs/Pleura: Stable right perihilar density and airway thickening, compatible with radiation pneumonitis/ fibrosis and chronic volume loss. Patchy ground-glass opacities in the right lung are unchanged. Loculated complex right pleural effusion is chronic. These various findings appear similar back through at least 2013 and accordingly are thought to be benign.  Musculoskeletal: Thoracic spondylosis and degenerative disc disease.  CT ABDOMEN PELVIS FINDINGS  Hepatobiliary: Unremarkable  Pancreas: Unremarkable  Spleen: Unremarkable  Adrenals/Urinary Tract: Unremarkable  Stomach/Bowel: Scattered air-fluid levels in nondilated loops  of jejunum.  Vascular/Lymphatic: Aortoiliac atherosclerotic vascular disease.  Reproductive: Unremarkable  Other: No supplemental non-categorized findings.  Musculoskeletal: Mild lumbar spondylosis and degenerative disc disease.  IMPRESSION: 1. Stable post therapy findings in the right thorax. No recurrent malignancy identified. 2. Coronary, aortic arch, and branch vessel atherosclerotic vascular disease. 3. Scattered air-fluid levels in nondilated loops of jejunum-low-grade proximal enteritis not excluded.   Electronically Signed   By: Van Clines M.D.   On: 05/07/2015 10:54   Mr Brain Wo Contrast  04/12/2015   CLINICAL DATA:  Personal history of lung cancer. New onset headaches. Renal insufficiency.  EXAM: MRI HEAD WITHOUT CONTRAST  TECHNIQUE: Multiplanar, multiecho pulse sequences of the brain and surrounding structures were obtained without intravenous contrast.  COMPARISON:  03/16/2011 CT, MRI 02/18/2007  FINDINGS: There chronic small-vessel ischemic changes affecting the pons. There is a 7 mm focus of abnormal T2 signal in the left cerebellum which is indeterminate for an old stroke versus a small metastasis. The cerebral hemispheres show mild generalized atrophy. There are old small vessel infarctions within the deep white matter. 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 No definable mass. No vasogenic edema. No hydrocephalus or extra-axial collection. No skull or skullbase lesion. No pituitary mass. There is mild mucosal inflammation in the ethmoid sinus region.  IMPRESSION: Sensitivity for metastatic disease is reduced without the ability to utilize contrast. Chronic appearing small-vessel ischemic changes throughout the brain as outlined above, with a small left parietal cortical and subcortical infarction as well. In the left cerebellum, there is a focus of white matter signal which, in this setting, is most likely to represent old white matter infarction.  However, the appearance could also be consistent with a small metastasis. Considering the inability to give contrast, one might consider short-term follow-up exam in 2-3 months to ensure these findings are stable.   Electronically Signed   By: Nelson Chimes M.D.   On: 04/12/2015 15:11    ASSESSMENT AND PLAN: This is a very pleasant 74 years old white female with metastatic non-small cell lung cancer currently on treatment with Tarceva 150 mg by mouth daily status post 31 months of treatment and tolerating it fairly well except for dry skin and grade 1-2 skin rash on the face and scalp. The recent MRI of the brain showed no clear evidence for metastatic disease to the brain. CT scan of the chest, abdomen and pelvis also shows no evidence for disease progression. I discussed the imaging results with the patient today. I recommended for the patient to continue her current treatment with Tarceva 150 mg by mouth daily. She would come back for followup visit in one month for reevaluation with repeat CBC and comprehensive metabolic panel.  She will continue to have Port-A-Cath flush every 8 weeks. She was advised to call immediately if she has any concerning symptoms in the interval.  The patient voices understanding of current disease status and treatment options and is in agreement with the current care plan.  All questions were answered. The patient knows to call the clinic with any problems, questions or concerns. We can certainly see the patient much sooner if necessary.  Disclaimer: This note was dictated with voice recognition software. Similar sounding words can inadvertently be transcribed and may not be corrected upon review.

## 2015-05-11 NOTE — Patient Instructions (Signed)

## 2015-06-07 ENCOUNTER — Other Ambulatory Visit (HOSPITAL_BASED_OUTPATIENT_CLINIC_OR_DEPARTMENT_OTHER): Payer: Medicare Other

## 2015-06-07 ENCOUNTER — Encounter: Payer: Self-pay | Admitting: Internal Medicine

## 2015-06-07 ENCOUNTER — Telehealth: Payer: Self-pay | Admitting: Internal Medicine

## 2015-06-07 ENCOUNTER — Ambulatory Visit (HOSPITAL_BASED_OUTPATIENT_CLINIC_OR_DEPARTMENT_OTHER): Payer: Medicare Other | Admitting: Internal Medicine

## 2015-06-07 VITALS — BP 122/55 | HR 82 | Temp 98.4°F | Resp 17 | Ht 66.0 in | Wt 227.5 lb

## 2015-06-07 DIAGNOSIS — R21 Rash and other nonspecific skin eruption: Secondary | ICD-10-CM

## 2015-06-07 DIAGNOSIS — R197 Diarrhea, unspecified: Secondary | ICD-10-CM | POA: Diagnosis not present

## 2015-06-07 DIAGNOSIS — C3430 Malignant neoplasm of lower lobe, unspecified bronchus or lung: Secondary | ICD-10-CM

## 2015-06-07 DIAGNOSIS — Z5111 Encounter for antineoplastic chemotherapy: Secondary | ICD-10-CM

## 2015-06-07 DIAGNOSIS — C3491 Malignant neoplasm of unspecified part of right bronchus or lung: Secondary | ICD-10-CM

## 2015-06-07 LAB — CBC WITH DIFFERENTIAL/PLATELET
BASO%: 0.8 % (ref 0.0–2.0)
BASOS ABS: 0 10*3/uL (ref 0.0–0.1)
EOS%: 5.7 % (ref 0.0–7.0)
Eosinophils Absolute: 0.3 10*3/uL (ref 0.0–0.5)
HEMATOCRIT: 36 % (ref 34.8–46.6)
HGB: 12.5 g/dL (ref 11.6–15.9)
LYMPH%: 10.6 % — ABNORMAL LOW (ref 14.0–49.7)
MCH: 33.2 pg (ref 25.1–34.0)
MCHC: 34.8 g/dL (ref 31.5–36.0)
MCV: 95.2 fL (ref 79.5–101.0)
MONO#: 0.5 10*3/uL (ref 0.1–0.9)
MONO%: 10.3 % (ref 0.0–14.0)
NEUT%: 72.6 % (ref 38.4–76.8)
NEUTROS ABS: 3.5 10*3/uL (ref 1.5–6.5)
Platelets: 199 10*3/uL (ref 145–400)
RBC: 3.78 10*6/uL (ref 3.70–5.45)
RDW: 13.9 % (ref 11.2–14.5)
WBC: 4.8 10*3/uL (ref 3.9–10.3)
lymph#: 0.5 10*3/uL — ABNORMAL LOW (ref 0.9–3.3)

## 2015-06-07 LAB — COMPREHENSIVE METABOLIC PANEL (CC13)
ALBUMIN: 3 g/dL — AB (ref 3.5–5.0)
ALT: 11 U/L (ref 0–55)
AST: 15 U/L (ref 5–34)
Alkaline Phosphatase: 68 U/L (ref 40–150)
Anion Gap: 6 mEq/L (ref 3–11)
BUN: 19.4 mg/dL (ref 7.0–26.0)
CO2: 30 mEq/L — ABNORMAL HIGH (ref 22–29)
CREATININE: 1.6 mg/dL — AB (ref 0.6–1.1)
Calcium: 10 mg/dL (ref 8.4–10.4)
Chloride: 105 mEq/L (ref 98–109)
EGFR: 32 mL/min/{1.73_m2} — AB (ref >90)
Glucose: 72 mg/dl (ref 70–140)
Potassium: 3.9 mEq/L (ref 3.5–5.1)
SODIUM: 141 meq/L (ref 136–145)
Total Bilirubin: 1.53 mg/dL — ABNORMAL HIGH (ref 0.20–1.20)
Total Protein: 5.9 g/dL — ABNORMAL LOW (ref 6.4–8.3)

## 2015-06-07 NOTE — Progress Notes (Signed)
[facility] sent facsimile confirmation of Tarceva prescription shipment.  Tarceva was shipped on 06-04-2015 with next business day delivery.

## 2015-06-07 NOTE — Progress Notes (Signed)
[place] Telephone:(336) [phone]   Fax:([phone]  OFFICE PROGRESS NOTE  [name], MD [address] [place] [address]  DIAGNOSIS: Recurrent non-small cell lung cancer, adenocarcinoma initially diagnosed as stage IIA in March 2008. The tumor cells were negative for EGFR mutation as well as ALK gene translocation.   PRIOR THERAPY:  1. Status post concurrent chemoradiation with weekly carboplatin and paclitaxel. Last dose was given Apr 12, 2007. 2. Status post 3 cycles of consolidation chemotherapy with docetaxel. Last dose was given July 12, 2007. 3. 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 4. Status post 39 cycles of maintenance Alimta at a dose of 500 mg/m2. Last dose was given February 25, 2010, discontinued secondary to persistent anemia, but the patient has stable disease.  CURRENT THERAPY: Tarceva 150 mg by mouth daily. Status post approximately 33 months of therapy.   CHEMOTHERAPY INTENT: Palliative  CURRENT # OF CHEMOTHERAPY CYCLES: 34 CURRENT ANTIEMETICS: Compazine when necessary  CURRENT SMOKING STATUS: Former smoker quit 11/27/2001  ORAL CHEMOTHERAPY AND CONSENT: Tarceva  CURRENT BISPHOSPHONATES USE: None  PAIN MANAGEMENT: 0/10  NARCOTICS INDUCED CONSTIPATION: None  LIVING WILL AND CODE STATUS: Full code initially but no prolonged resuscitation.   INTERVAL HISTORY: Diane [name] 74 y.o. female returns to the clinic today for routine monthly followup visit. The patient is feeling fine today with no specific complaints except for few episodes of diarrhea yesterday. She continues to tolerate her current treatment with Tarceva fairly well except for few scabs on her head and mild skin rash on the face. She has no significant fever or chills no nausea or vomiting. She has shortness of breath with exertion with no cough or hemoptysis. She denied having any significant  weight loss or night sweats. She has no nausea or vomiting. She had repeat CBC and comprehensive metabolic panel performed earlier today and she is here for evaluation and discussion of her lab results.  MEDICAL HISTORY: Past Medical History  Diagnosis Date  . Hypertension   . Renal insufficiency     stage 3 renal disease/ pt  . lung ca dx'd 02/2010  . 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  MEDICATIONS:  Current Outpatient Prescriptions  Medication Sig Dispense Refill  . aspirin 81 MG tablet Take 81 mg by mouth daily.      . calcitRIOL (ROCALTROL) 0.25 MCG capsule Take 0.25 mcg by mouth every Monday, Wednesday, and Friday.    . clindamycin (CLEOCIN-T) 1 % external solution Apply topically 2 (two) times daily. Apply as directed 60 mL 1  . erlotinib (TARCEVA) 150 MG tablet Take 1 tablet (150 mg total) by mouth daily. Take on an empty stomach 1 hour before meals or 2 hours after. 30 tablet 2  . labetalol (NORMODYNE) 200 MG tablet Take 200 mg by mouth 2 (two) times daily.      . Melatonin 5 MG CAPS Take 1 capsule by mouth.    . neomycin-polymyxin b-dexamethasone (MAXITROL) 3.5-10000-0.1 SUSP Place 1 drop into both eyes.     . polyethylene glycol (MIRALAX / GLYCOLAX) packet Take 17 g by mouth daily.    Marland Kitchen torsemide (DEMADEX) 20 MG tablet Take 20 mg by mouth daily.       No current facility-administered medications for this visit.   Facility-Administered Medications Ordered in Other Visits  Medication Dose Route Frequency Provider Last  Rate Last Dose  . sodium chloride 0.9 % injection 10 mL  10 mL Intravenous PRN Curt Bears, MD   10 mL at 10/14/13 1030    SURGICAL HISTORY:  Past Surgical History  Procedure Laterality Date  . Portacath placement  11/03/08-[facility]    tip in mid SVC-E. Mansell    REVIEW OF SYSTEMS:  Constitutional: negative Eyes: negative Ears, nose, mouth, throat, and face: negative Respiratory:  negative Cardiovascular: negative Gastrointestinal: negative Genitourinary:negative Integument/breast: positive for dryness and rash Hematologic/lymphatic: negative Musculoskeletal:negative Neurological: negative Behavioral/Psych: negative Endocrine: negative Allergic/Immunologic: negative   PHYSICAL EXAMINATION: General appearance: alert, cooperative and no distress Head: Normocephalic, without obvious abnormality, atraumatic Neck: no adenopathy, no carotid bruit, no JVD, supple, symmetrical, trachea midline and thyroid not enlarged, symmetric, no tenderness/mass/nodules Lymph nodes: Cervical, supraclavicular, and axillary nodes normal. Resp: clear to auscultation bilaterally and normal percussion bilaterally Back: symmetric, no curvature. ROM normal. No CVA tenderness. Cardio: regular rate and rhythm, S1, S2 normal, no murmur, click, rub or gallop GI: soft, non-tender; bowel sounds normal; no masses,  no organomegaly Extremities: extremities normal, atraumatic, no cyanosis or edema Neurologic: Alert and oriented X 3, normal strength and tone. Normal symmetric reflexes. Normal coordination and gait  ECOG PERFORMANCE STATUS: 1 - Symptomatic but completely ambulatory  Blood pressure 122/55, pulse 82, temperature 98.4 F (36.9 C), temperature source Oral, resp. rate 17, height $RemoveBe'5\' 6"'jUORixKbL$  (1.676 m), weight 227 lb 8 oz (103.193 kg), SpO2 97 %.  LABORATORY DATA: Lab Results  Component Value Date   WBC 4.8 06/07/2015   HGB 12.5 06/07/2015   HCT 36.0 06/07/2015   MCV 95.2 06/07/2015   PLT 199 06/07/2015      Chemistry      Component Value Date/Time   NA 140 05/11/2015 0753   NA 137 11/14/2012 0816   NA 143 07/11/2012 0927   K 3.9 05/11/2015 0753   K 3.9 11/14/2012 0816   K 4.5 07/11/2012 0927   CL 102 05/08/2013 0851   CL 101 11/14/2012 0816   CL 101 07/11/2012 0927   CO2 28 05/11/2015 0753   CO2 28 11/14/2012 0816   CO2 29 07/11/2012 0927   BUN 18.6 05/11/2015 0753   BUN 17  11/14/2012 0816   BUN 16 07/11/2012 0927   CREATININE 1.6* 05/11/2015 0753   CREATININE 1.52* 11/14/2012 0816   CREATININE 1.5* 07/11/2012 0927      Component Value Date/Time   CALCIUM 9.5 05/11/2015 0753   CALCIUM 10.2 11/14/2012 0816   CALCIUM 10.0 07/11/2012 0927   ALKPHOS 63 05/11/2015 0753   ALKPHOS 74 11/14/2012 0816   ALKPHOS 71 07/11/2012 0927   AST 13 05/11/2015 0753   AST 16 11/14/2012 0816   AST 19 07/11/2012 0927   ALT 10 05/11/2015 0753   ALT 8 11/14/2012 0816   ALT 18 07/11/2012 0927   BILITOT 1.03 05/11/2015 0753   BILITOT 1.2 11/14/2012 0816   BILITOT 0.80 07/11/2012 0927       RADIOGRAPHIC STUDIES: No results found.  ASSESSMENT AND PLAN: This is a very pleasant 74 years old white female with metastatic non-small cell lung cancer currently on treatment with Tarceva 150 mg by mouth daily status post 32 months of treatment and tolerating it fairly well except for dry skin and grade 1-2 skin rash on the face and scalp. The patient is doing fine. I recommended for the patient to continue her current treatment with Tarceva 150 mg by mouth daily. She would come back for followup  visit in one month for reevaluation with repeat CBC and comprehensive metabolic panel.  She will continue to have Port-A-Cath flush every 8 weeks. She was advised to call immediately if she has any concerning symptoms in the interval.  The patient voices understanding of current disease status and treatment options and is in agreement with the current care plan.  All questions were answered. The patient knows to call the clinic with any problems, questions or concerns. We can certainly see the patient much sooner if necessary.  Disclaimer: This note was dictated with voice recognition software. Similar sounding words can inadvertently be transcribed and may not be corrected upon review.

## 2015-06-07 NOTE — Telephone Encounter (Signed)
Pt confirmed labs/ov per 07/11 POF, gave pt AVS and Calendar.... KJ °

## 2015-07-01 ENCOUNTER — Other Ambulatory Visit: Payer: Self-pay | Admitting: Medical Oncology

## 2015-07-01 DIAGNOSIS — C3490 Malignant neoplasm of unspecified part of unspecified bronchus or lung: Secondary | ICD-10-CM

## 2015-07-01 MED ORDER — ERLOTINIB HCL 150 MG PO TABS: 150.0000 mg | ORAL_TABLET | Freq: Every day | ORAL | Status: DC

## 2015-07-08 ENCOUNTER — Ambulatory Visit (HOSPITAL_BASED_OUTPATIENT_CLINIC_OR_DEPARTMENT_OTHER): Payer: Medicare Other | Admitting: Internal Medicine

## 2015-07-08 ENCOUNTER — Other Ambulatory Visit (HOSPITAL_BASED_OUTPATIENT_CLINIC_OR_DEPARTMENT_OTHER): Payer: Medicare Other

## 2015-07-08 ENCOUNTER — Telehealth: Payer: Self-pay | Admitting: Internal Medicine

## 2015-07-08 ENCOUNTER — Encounter: Payer: Self-pay | Admitting: *Deleted

## 2015-07-08 ENCOUNTER — Ambulatory Visit (HOSPITAL_BASED_OUTPATIENT_CLINIC_OR_DEPARTMENT_OTHER): Payer: Medicare Other

## 2015-07-08 ENCOUNTER — Encounter: Payer: Self-pay | Admitting: Internal Medicine

## 2015-07-08 VITALS — BP 119/61 | HR 81 | Temp 98.4°F | Resp 18 | Ht 66.0 in | Wt 228.1 lb

## 2015-07-08 DIAGNOSIS — Z5111 Encounter for antineoplastic chemotherapy: Secondary | ICD-10-CM

## 2015-07-08 DIAGNOSIS — R21 Rash and other nonspecific skin eruption: Secondary | ICD-10-CM | POA: Diagnosis not present

## 2015-07-08 DIAGNOSIS — C3430 Malignant neoplasm of lower lobe, unspecified bronchus or lung: Secondary | ICD-10-CM

## 2015-07-08 DIAGNOSIS — C3491 Malignant neoplasm of unspecified part of right bronchus or lung: Secondary | ICD-10-CM

## 2015-07-08 DIAGNOSIS — Z95828 Presence of other vascular implants and grafts: Secondary | ICD-10-CM

## 2015-07-08 LAB — CBC WITH DIFFERENTIAL/PLATELET
BASO%: 0.8 % (ref 0.0–2.0)
Basophils Absolute: 0 10*3/uL (ref 0.0–0.1)
EOS%: 5.7 % (ref 0.0–7.0)
Eosinophils Absolute: 0.3 10*3/uL (ref 0.0–0.5)
HCT: 35.4 % (ref 34.8–46.6)
HGB: 11.8 g/dL (ref 11.6–15.9)
LYMPH%: 9.7 % — ABNORMAL LOW (ref 14.0–49.7)
MCH: 31.7 pg (ref 25.1–34.0)
MCHC: 33.2 g/dL (ref 31.5–36.0)
MCV: 95.7 fL (ref 79.5–101.0)
MONO#: 0.4 10*3/uL (ref 0.1–0.9)
MONO%: 8.8 % (ref 0.0–14.0)
NEUT#: 3.6 10*3/uL (ref 1.5–6.5)
NEUT%: 75 % (ref 38.4–76.8)
Platelets: 181 10*3/uL (ref 145–400)
RBC: 3.7 10*6/uL (ref 3.70–5.45)
RDW: 13.6 % (ref 11.2–14.5)
WBC: 4.8 10*3/uL (ref 3.9–10.3)
lymph#: 0.5 10*3/uL — ABNORMAL LOW (ref 0.9–3.3)

## 2015-07-08 LAB — COMPREHENSIVE METABOLIC PANEL (CC13)
ALK PHOS: 67 U/L (ref 40–150)
ALT: 11 U/L (ref 0–55)
AST: 14 U/L (ref 5–34)
Albumin: 2.9 g/dL — ABNORMAL LOW (ref 3.5–5.0)
Anion Gap: 5 mEq/L (ref 3–11)
BUN: 17.3 mg/dL (ref 7.0–26.0)
CALCIUM: 9.4 mg/dL (ref 8.4–10.4)
CHLORIDE: 105 meq/L (ref 98–109)
CO2: 30 mEq/L — ABNORMAL HIGH (ref 22–29)
Creatinine: 1.4 mg/dL — ABNORMAL HIGH (ref 0.6–1.1)
EGFR: 37 mL/min/{1.73_m2} — ABNORMAL LOW (ref >90)
Glucose: 111 mg/dl (ref 70–140)
POTASSIUM: 4 meq/L (ref 3.5–5.1)
Sodium: 140 mEq/L (ref 136–145)
Total Bilirubin: 1.54 mg/dL — ABNORMAL HIGH (ref 0.20–1.20)
Total Protein: 5.6 g/dL — ABNORMAL LOW (ref 6.4–8.3)

## 2015-07-08 MED ORDER — HEPARIN SOD (PORK) LOCK FLUSH 100 UNIT/ML IV SOLN
500.0000 [IU] | Freq: Once | INTRAVENOUS | Status: AC
  Administered 2015-07-08: 500 [IU] via INTRAVENOUS
  Filled 2015-07-08: qty 5

## 2015-07-08 MED ORDER — SODIUM CHLORIDE 0.9 % IJ SOLN
10.0000 mL | INTRAMUSCULAR | Status: DC | PRN
  Administered 2015-07-08: 10 mL via INTRAVENOUS
  Filled 2015-07-08: qty 10

## 2015-07-08 NOTE — Progress Notes (Signed)
[place] [phone]   Fax:([phone]  OFFICE PROGRESS NOTE  [name], MD [address] [place] [address]  DIAGNOSIS: Recurrent non-small cell lung cancer, adenocarcinoma initially diagnosed as stage IIA in March 2008. The tumor cells were negative for EGFR mutation as well as ALK gene translocation.   PRIOR THERAPY:  1. Status post concurrent chemoradiation with weekly carboplatin and paclitaxel. Last dose was given Apr 12, 2007. 2. Status post 3 cycles of consolidation chemotherapy with docetaxel. Last dose was given July 12, 2007. 3. 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 4. Status post 39 cycles of maintenance Alimta at a dose of 500 mg/m2. Last dose was given February 25, 2010, discontinued secondary to persistent anemia, but the patient has stable disease.  CURRENT THERAPY: Tarceva 150 mg by mouth daily. Status post approximately 34 months of therapy.   CHEMOTHERAPY INTENT: Palliative  CURRENT # OF CHEMOTHERAPY CYCLES: 35 CURRENT ANTIEMETICS: Compazine when necessary  CURRENT SMOKING STATUS: Former smoker quit 11/27/2001  ORAL CHEMOTHERAPY AND CONSENT: Tarceva  CURRENT BISPHOSPHONATES USE: None  PAIN MANAGEMENT: 0/10  NARCOTICS INDUCED CONSTIPATION: None  LIVING WILL AND CODE STATUS: Full code initially but no prolonged resuscitation.   INTERVAL HISTORY: Diane [name] 74 y.o. female returns to the clinic today for routine monthly followup visit. The patient is feeling fine today with no specific complaints except for mild skin rash more on the fingers. She continues to tolerate her current treatment with Tarceva fairly well. She has no significant fever or chills no nausea or vomiting. She has shortness of breath with exertion with no cough or hemoptysis. She denied having any significant weight loss or night sweats. She has no nausea or vomiting. She  had repeat CBC and comprehensive metabolic panel performed earlier today and she is here for evaluation and discussion of her lab results.  MEDICAL HISTORY: Past Medical History  Diagnosis Date  . Hypertension   . Renal insufficiency     stage 3 renal disease/ pt  . lung ca dx'd 02/2010  . 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  MEDICATIONS:  Current Outpatient Prescriptions  Medication Sig Dispense Refill  . aspirin 81 MG tablet Take 81 mg by mouth daily.      . calcitRIOL (ROCALTROL) 0.25 MCG capsule Take 0.25 mcg by mouth every Monday, Wednesday, and Friday.    . clindamycin (CLEOCIN-T) 1 % external solution Apply topically 2 (two) times daily. Apply as directed 60 mL 1  . erlotinib (TARCEVA) 150 MG tablet Take 1 tablet (150 mg total) by mouth daily. Take on an empty stomach 1 hour before meals or 2 hours after. 30 tablet 2  . labetalol (NORMODYNE) 200 MG tablet Take 200 mg by mouth 2 (two) times daily.      Marland Kitchen neomycin-polymyxin b-dexamethasone (MAXITROL) 3.5-10000-0.1 SUSP Place 1 drop into both eyes.     . polyethylene glycol (MIRALAX / GLYCOLAX) packet Take 17 g by mouth daily.    Marland Kitchen torsemide (DEMADEX) 20 MG tablet Take 20 mg by mouth daily.      . Melatonin 5 MG CAPS Take 1 capsule by mouth.     No current facility-administered medications for this visit.   Facility-Administered Medications Ordered in Other Visits  Medication Dose Route Frequency Provider Last Rate Last Dose  . sodium chloride 0.9 % injection 10 mL  10 mL Intravenous PRN Curt Bears, MD   10 mL at 10/14/13 1030    SURGICAL HISTORY:  Past Surgical History  Procedure Laterality Date  . Portacath placement  11/03/08-[facility]    tip in mid SVC-E. Mansell    REVIEW OF SYSTEMS:  Constitutional: negative Eyes: negative Ears, nose, mouth, throat, and face: negative Respiratory: negative Cardiovascular: negative Gastrointestinal:  negative Genitourinary:negative Integument/breast: positive for dryness and rash Hematologic/lymphatic: negative Musculoskeletal:negative Neurological: negative Behavioral/Psych: negative Endocrine: negative Allergic/Immunologic: negative   PHYSICAL EXAMINATION: General appearance: alert, cooperative and no distress Head: Normocephalic, without obvious abnormality, atraumatic Neck: no adenopathy, no carotid bruit, no JVD, supple, symmetrical, trachea midline and thyroid not enlarged, symmetric, no tenderness/mass/nodules Lymph nodes: Cervical, supraclavicular, and axillary nodes normal. Resp: clear to auscultation bilaterally and normal percussion bilaterally Back: symmetric, no curvature. ROM normal. No CVA tenderness. Cardio: regular rate and rhythm, S1, S2 normal, no murmur, click, rub or gallop GI: soft, non-tender; bowel sounds normal; no masses,  no organomegaly Extremities: extremities normal, atraumatic, no cyanosis or edema Neurologic: Alert and oriented X 3, normal strength and tone. Normal symmetric reflexes. Normal coordination and gait  ECOG PERFORMANCE STATUS: 1 - Symptomatic but completely ambulatory  Blood pressure 119/61, pulse 81, temperature 98.4 F (36.9 C), temperature source Oral, resp. rate 18, height $RemoveBe'5\' 6"'lsCvfJuPp$  (1.676 m), weight 228 lb 1.6 oz (103.465 kg), SpO2 96 %.  LABORATORY DATA: Lab Results  Component Value Date   WBC 4.8 07/08/2015   HGB 11.8 07/08/2015   HCT 35.4 07/08/2015   MCV 95.7 07/08/2015   PLT 181 07/08/2015      Chemistry      Component Value Date/Time   NA 140 07/08/2015 0937   NA 137 11/14/2012 0816   NA 143 07/11/2012 0927   K 4.0 07/08/2015 0937   K 3.9 11/14/2012 0816   K 4.5 07/11/2012 0927   CL 102 05/08/2013 0851   CL 101 11/14/2012 0816   CL 101 07/11/2012 0927   CO2 30* 07/08/2015 0937   CO2 28 11/14/2012 0816   CO2 29 07/11/2012 0927   BUN 17.3 07/08/2015 0937   BUN 17 11/14/2012 0816   BUN 16 07/11/2012 0927    CREATININE 1.4* 07/08/2015 0937   CREATININE 1.52* 11/14/2012 0816   CREATININE 1.5* 07/11/2012 0927      Component Value Date/Time   CALCIUM 9.4 07/08/2015 0937   CALCIUM 10.2 11/14/2012 0816   CALCIUM 10.0 07/11/2012 0927   ALKPHOS 67 07/08/2015 0937   ALKPHOS 74 11/14/2012 0816   ALKPHOS 71 07/11/2012 0927   AST 14 07/08/2015 0937   AST 16 11/14/2012 0816   AST 19 07/11/2012 0927   ALT 11 07/08/2015 0937   ALT 8 11/14/2012 0816   ALT 18 07/11/2012 0927   BILITOT 1.54* 07/08/2015 0937   BILITOT 1.2 11/14/2012 0816   BILITOT 0.80 07/11/2012 0927       RADIOGRAPHIC STUDIES: No results found.  ASSESSMENT AND PLAN: This is a very pleasant 74 years old white female with metastatic non-small cell lung cancer currently on treatment with Tarceva 150 mg by mouth daily status post 34 months of treatment and tolerating it fairly well except for dry skin and grade 1-2 skin rash on the face and fingers. I recommended for the patient to continue her current treatment with Tarceva 150 mg by mouth daily. She would come back for followup visit in one month for reevaluation with repeat CBC and comprehensive metabolic panel as well as CT scan  of the Chest, Abdomen and pelvis for restaging of her disease.  She will continue to have Port-A-Cath flush every 8 weeks. She was advised to call immediately if she has any concerning symptoms in the interval.  The patient voices understanding of current disease status and treatment options and is in agreement with the current care plan.  All questions were answered. The patient knows to call the clinic with any problems, questions or concerns. We can certainly see the patient much sooner if necessary.  Disclaimer: This note was dictated with voice recognition software. Similar sounding words can inadvertently be transcribed and may not be corrected upon review.

## 2015-07-08 NOTE — Patient Instructions (Signed)

## 2015-07-08 NOTE — Telephone Encounter (Signed)
pt stated no contrast wants waterbase-adv [facility] will call to [facility] pt scans

## 2015-07-08 NOTE — Progress Notes (Signed)
Oncology Nurse Navigator Documentation  Oncology Nurse Navigator Flowsheets 07/08/2015  Navigator Encounter Type Treatment/spoke to patient today at [facility].  She was diagnosed with lung cancer in 2008 and has been through several different treatments for lung cancer.  She is currently on Tarceva.  I asked about side effects and she stated minimal.  Her cancer journey is very interesting and I enjoyed learning.  No barriers identified at this time.    Patient Visit Type Medonc  Treatment Phase Treatment  Barriers/Navigation Needs No barriers at this time  Interventions None required  Time Spent with Patient 15

## 2015-07-08 NOTE — Telephone Encounter (Signed)
per pof to sch pt appt-gave pt copy of avs °

## 2015-07-13 ENCOUNTER — Other Ambulatory Visit: Payer: Self-pay | Admitting: Medical Oncology

## 2015-07-13 DIAGNOSIS — C3491 Malignant neoplasm of unspecified part of right bronchus or lung: Secondary | ICD-10-CM

## 2015-08-03 ENCOUNTER — Other Ambulatory Visit (HOSPITAL_BASED_OUTPATIENT_CLINIC_OR_DEPARTMENT_OTHER): Payer: Medicare Other

## 2015-08-03 ENCOUNTER — Ambulatory Visit (HOSPITAL_COMMUNITY)
Admission: RE | Admit: 2015-08-03 | Discharge: 2015-08-03 | Disposition: A | Discharge status: Home / Self Care | Payer: Medicare Other | Source: Ambulatory Visit | Attending: Internal Medicine | Admitting: Internal Medicine

## 2015-08-03 ENCOUNTER — Encounter (HOSPITAL_COMMUNITY): Payer: Self-pay

## 2015-08-03 DIAGNOSIS — I517 Cardiomegaly: Secondary | ICD-10-CM | POA: Insufficient documentation

## 2015-08-03 DIAGNOSIS — C3430 Malignant neoplasm of lower lobe, unspecified bronchus or lung: Secondary | ICD-10-CM | POA: Diagnosis not present

## 2015-08-03 DIAGNOSIS — J439 Emphysema, unspecified: Secondary | ICD-10-CM | POA: Insufficient documentation

## 2015-08-03 DIAGNOSIS — I2510 Atherosclerotic heart disease of native coronary artery without angina pectoris: Secondary | ICD-10-CM | POA: Insufficient documentation

## 2015-08-03 DIAGNOSIS — R21 Rash and other nonspecific skin eruption: Secondary | ICD-10-CM

## 2015-08-03 DIAGNOSIS — C3491 Malignant neoplasm of unspecified part of right bronchus or lung: Secondary | ICD-10-CM

## 2015-08-03 DIAGNOSIS — Z5111 Encounter for antineoplastic chemotherapy: Secondary | ICD-10-CM

## 2015-08-03 LAB — CBC WITH DIFFERENTIAL/PLATELET
BASO%: 1 % (ref 0.0–2.0)
Basophils Absolute: 0.1 10*3/uL (ref 0.0–0.1)
EOS ABS: 0.3 10*3/uL (ref 0.0–0.5)
EOS%: 5.8 % (ref 0.0–7.0)
HCT: 37 % (ref 34.8–46.6)
HEMOGLOBIN: 12.5 g/dL (ref 11.6–15.9)
LYMPH%: 8.3 % — ABNORMAL LOW (ref 14.0–49.7)
MCH: 32.2 pg (ref 25.1–34.0)
MCHC: 33.7 g/dL (ref 31.5–36.0)
MCV: 95.5 fL (ref 79.5–101.0)
MONO#: 0.5 10*3/uL (ref 0.1–0.9)
MONO%: 8.1 % (ref 0.0–14.0)
NEUT#: 4.5 10*3/uL (ref 1.5–6.5)
NEUT%: 76.8 % (ref 38.4–76.8)
Platelets: 185 10*3/uL (ref 145–400)
RBC: 3.88 10*6/uL (ref 3.70–5.45)
RDW: 14 % (ref 11.2–14.5)
WBC: 5.9 10*3/uL (ref 3.9–10.3)
lymph#: 0.5 10*3/uL — ABNORMAL LOW (ref 0.9–3.3)

## 2015-08-03 LAB — COMPREHENSIVE METABOLIC PANEL (CC13)
ALT: 12 U/L (ref 0–55)
AST: 15 U/L (ref 5–34)
Albumin: 3 g/dL — ABNORMAL LOW (ref 3.5–5.0)
Alkaline Phosphatase: 63 U/L (ref 40–150)
Anion Gap: 7 mEq/L (ref 3–11)
BUN: 21.2 mg/dL (ref 7.0–26.0)
CO2: 29 meq/L (ref 22–29)
Calcium: 10 mg/dL (ref 8.4–10.4)
Chloride: 104 mEq/L (ref 98–109)
Creatinine: 1.5 mg/dL — ABNORMAL HIGH (ref 0.6–1.1)
EGFR: 33 mL/min/{1.73_m2} — ABNORMAL LOW (ref >90)
Glucose: 103 mg/dl (ref 70–140)
POTASSIUM: 4.1 meq/L (ref 3.5–5.1)
Sodium: 140 mEq/L (ref 136–145)
Total Bilirubin: 1.37 mg/dL — ABNORMAL HIGH (ref 0.20–1.20)
Total Protein: 6.1 g/dL — ABNORMAL LOW (ref 6.4–8.3)

## 2015-08-03 MED ORDER — IOHEXOL 300 MG/ML  SOLN
50.0000 mL | Freq: Once | INTRAMUSCULAR | Status: AC | PRN
  Administered 2015-08-03: 50 mL via ORAL

## 2015-08-05 ENCOUNTER — Telehealth: Payer: Self-pay | Admitting: Internal Medicine

## 2015-08-05 NOTE — Telephone Encounter (Signed)
sw. pt and r/s appt due to MD out of the office...pt ok and aware of new d.t °

## 2015-08-09 ENCOUNTER — Ambulatory Visit: Payer: Medicare Other | Admitting: Internal Medicine

## 2015-08-12 ENCOUNTER — Telehealth: Payer: Self-pay | Admitting: Internal Medicine

## 2015-08-12 ENCOUNTER — Ambulatory Visit (HOSPITAL_BASED_OUTPATIENT_CLINIC_OR_DEPARTMENT_OTHER): Payer: Medicare Other | Admitting: Physician Assistant

## 2015-08-12 ENCOUNTER — Telehealth: Payer: Self-pay | Admitting: *Deleted

## 2015-08-12 ENCOUNTER — Encounter: Payer: Self-pay | Admitting: Physician Assistant

## 2015-08-12 VITALS — BP 131/58 | HR 84 | Temp 98.4°F | Resp 19 | Ht 66.0 in | Wt 230.2 lb

## 2015-08-12 DIAGNOSIS — C3491 Malignant neoplasm of unspecified part of right bronchus or lung: Secondary | ICD-10-CM | POA: Diagnosis not present

## 2015-08-12 DIAGNOSIS — R21 Rash and other nonspecific skin eruption: Secondary | ICD-10-CM

## 2015-08-12 MED ORDER — METHYLPREDNISOLONE 4 MG PO TBPK: ORAL_TABLET | ORAL | Status: DC

## 2015-08-12 NOTE — Telephone Encounter (Signed)
Adrena  E-scribed prescription to  Pt.'s pharmacy

## 2015-08-12 NOTE — Progress Notes (Addendum)
[facility] [phone]   [phone]  OFFICE PROGRESS NOTE  [name], MD [address] [place] [address]  DIAGNOSIS: Recurrent non-small cell lung cancer, adenocarcinoma initially diagnosed as stage IIA in March 2008. The tumor cells were negative for EGFR mutation as well as ALK gene translocation.   PRIOR THERAPY:  1. Status post concurrent chemoradiation with weekly carboplatin and paclitaxel. Last dose was given Apr 12, 2007. 2. Status post 3 cycles of consolidation chemotherapy with docetaxel. Last dose was given July 12, 2007. 3. 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 4. Status post 39 cycles of maintenance Alimta at a dose of 500 mg/m2. Last dose was given February 25, 2010, discontinued secondary to persistent anemia, but the patient has stable disease.  CURRENT THERAPY: Tarceva 150 mg by mouth daily. Status post approximately 35 months of therapy.   CHEMOTHERAPY INTENT: Palliative  CURRENT # OF CHEMOTHERAPY CYCLES: 36 CURRENT ANTIEMETICS: Compazine when necessary  CURRENT SMOKING STATUS: Former smoker quit 11/27/2001  ORAL CHEMOTHERAPY AND CONSENT: Tarceva  CURRENT BISPHOSPHONATES USE: None  PAIN MANAGEMENT: 0/10  NARCOTICS INDUCED CONSTIPATION: None  LIVING WILL AND CODE STATUS: Full code initially but no prolonged resuscitation.   INTERVAL HISTORY: Diane [name] 74 y.o. female returns to the clinic today for routine monthly followup visit. The patient is feeling better today. She had a recent Tarceva rash flare on her extremities and scalp and stopped taking the Tarceva on 08/01/2015. She is currently taking a course of Bactrim for an infected scalp lesion. She states that the fund that was paying for her Tarceva has exhausted it funds. She states that she has approximately 5 weeks of Tarceva $RemoveBe'150mg'[id number]$ .  She has no significant fever or chills no  nausea or vomiting. She has shortness of breath with exertion with no cough or hemoptysis. She denied having any significant weight loss or night sweats. She has no nausea or vomiting. She had repeat CBC and comprehensive metabolic panel performed earlier today and she is here for evaluation and discussion of her lab results. She also recently had a restaging CT scan of her chest, abdomen and pelvis and presents to discuss the results.  MEDICAL HISTORY: Past Medical History  Diagnosis Date  . Hypertension   . Renal insufficiency     stage 3 renal disease/ pt  . lung ca dx'd 02/2010  . 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  MEDICATIONS:  Current Outpatient Prescriptions  Medication Sig Dispense Refill  . sulfamethoxazole-trimethoprim (BACTRIM DS,SEPTRA DS) 800-160 MG per tablet Take 1 tablet by mouth 2 (two) times daily.    Marland Kitchen aspirin 81 MG tablet Take 81 mg by mouth daily.      . calcitRIOL (ROCALTROL) 0.25 MCG capsule Take 0.25 mcg by mouth every Monday, Wednesday, and Friday.    . clindamycin (CLEOCIN-T) 1 % external solution Apply topically 2 (two) times daily. Apply as directed 60 mL 1  . erlotinib (TARCEVA) 150 MG tablet Take 1 tablet (150 mg total) by mouth daily. Take on an empty stomach 1 hour before meals or 2 hours after. 30 tablet 2  . labetalol (NORMODYNE) 200 MG tablet Take 200 mg by mouth 2 (two) times daily.      . Melatonin 5 MG CAPS Take 1 capsule by mouth.    . methylPREDNISolone (MEDROL DOSEPAK) 4 MG TBPK tablet  Take as directed with food 21 tablet 0  . neomycin-polymyxin b-dexamethasone (MAXITROL) 3.5-10000-0.1 SUSP Place 1 drop into both eyes.     . polyethylene glycol (MIRALAX / GLYCOLAX) packet Take 17 g by mouth daily.    Marland Kitchen torsemide (DEMADEX) 20 MG tablet Take 20 mg by mouth daily.       No current facility-administered medications for this visit.   Facility-Administered Medications Ordered in Other Visits    Medication Dose Route Frequency Provider Last Rate Last Dose  . sodium chloride 0.9 % injection 10 mL  10 mL Intravenous PRN Curt Bears, MD   10 mL at 10/14/13 1030    SURGICAL HISTORY:  Past Surgical History  Procedure Laterality Date  . Portacath placement  11/03/08-[facility]    tip in mid SVC-E. Mansell    REVIEW OF SYSTEMS:  Constitutional: negative Eyes: negative Ears, nose, mouth, throat, and face: negative Respiratory: negative Cardiovascular: negative Gastrointestinal: negative Genitourinary:negative Integument/breast: positive for dryness, rash and increased rash flare recently Hematologic/lymphatic: negative Musculoskeletal:negative Neurological: negative Behavioral/Psych: negative Endocrine: negative Allergic/Immunologic: negative   PHYSICAL EXAMINATION: General appearance: alert, cooperative and no distress Head: Normocephalic, without obvious abnormality, atraumatic Neck: no adenopathy, no carotid bruit, no JVD, supple, symmetrical, trachea midline and thyroid not enlarged, symmetric, no tenderness/mass/nodules Lymph nodes: Cervical, supraclavicular, and axillary nodes normal. Resp: clear to auscultation bilaterally and normal percussion bilaterally Back: symmetric, no curvature. ROM normal. No CVA tenderness. Cardio: regular rate and rhythm, S1, S2 normal, no murmur, click, rub or gallop GI: soft, non-tender; bowel sounds normal; no masses,  no organomegaly Extremities: extremities normal, atraumatic, no cyanosis or edema Neurologic: Alert and oriented X 3, normal strength and tone. Normal symmetric reflexes. Normal coordination and gait Skin: Grade II erythema and acneform lesions scattered primarily on the lower extremities and to a lesser extent on the upper extremities, right scalp lesion with eschar in place.  ECOG PERFORMANCE STATUS: 1 - Symptomatic but completely ambulatory  Blood pressure 131/58, pulse 84, temperature 98.4 F (36.9 C), temperature  source Oral, resp. rate 19, height $RemoveBe'5\' 6"'QippJgPyt$  (1.676 m), weight 230 lb 3.2 oz (104.418 kg), SpO2 96 %.  LABORATORY DATA: Lab Results  Component Value Date   WBC 5.9 08/03/2015   HGB 12.5 08/03/2015   HCT 37.0 08/03/2015   MCV 95.5 08/03/2015   PLT 185 08/03/2015      Chemistry      Component Value Date/Time   NA 140 08/03/2015 0823   NA 137 11/14/2012 0816   NA 143 07/11/2012 0927   K 4.1 08/03/2015 0823   K 3.9 11/14/2012 0816   K 4.5 07/11/2012 0927   CL 102 05/08/2013 0851   CL 101 11/14/2012 0816   CL 101 07/11/2012 0927   CO2 29 08/03/2015 0823   CO2 28 11/14/2012 0816   CO2 29 07/11/2012 0927   BUN 21.2 08/03/2015 0823   BUN 17 11/14/2012 0816   BUN 16 07/11/2012 0927   CREATININE 1.5* 08/03/2015 0823   CREATININE 1.52* 11/14/2012 0816   CREATININE 1.5* 07/11/2012 0927      Component Value Date/Time   CALCIUM 10.0 08/03/2015 0823   CALCIUM 10.2 11/14/2012 0816   CALCIUM 10.0 07/11/2012 0927   ALKPHOS 63 08/03/2015 0823   ALKPHOS 74 11/14/2012 0816   ALKPHOS 71 07/11/2012 0927   AST 15 08/03/2015 0823   AST 16 11/14/2012 0816   AST 19 07/11/2012 0927   ALT 12 08/03/2015 0823   ALT 8 11/14/2012 0816   ALT  18 07/11/2012 0927   BILITOT 1.37* 08/03/2015 0823   BILITOT 1.2 11/14/2012 0816   BILITOT 0.80 07/11/2012 0927       RADIOGRAPHIC STUDIES: Ct Abdomen Pelvis Wo Contrast  08/03/2015   CLINICAL DATA:  Lung cancer diagnosed in 2008. Chemotherapy completed in 2011. Radiation therapy completed in 2008. Left parotid gland cancer diagnosed 41 years ago. Chest pain on exertion.  EXAM: CT CHEST, ABDOMEN AND PELVIS WITHOUT CONTRAST  TECHNIQUE: Multidetector CT imaging of the chest, abdomen and pelvis was performed following the standard protocol without IV contrast.  COMPARISON:  05/07/2015  FINDINGS: CT CHEST FINDINGS  Mediastinum/Nodes: Stable small supraclavicular nodes. Left-sided Port-A-Cath terminates at the low SVC. Subcentimeter right thyroid low-density nodules  are unchanged and nonspecific. Aortic and branch vessel atherosclerosis. Mild cardiomegaly. Multivessel coronary artery atherosclerosis. No mediastinal or definite hilar adenopathy, given limitations of unenhanced CT. Small hiatal hernia.  Lungs/Pleura: Moderate centrilobular emphysema.  Scattered interstitial opacities in the right upper lobe are not significantly changed.  Right infrahilar consolidation is not significantly changed and likely radiation induced.  Minimal interstitial opacities in the left upper lobe, including on images 13 and 17 are not significantly changed.  Right-sided complex pleural effusion with calcifications within. This is moderate in size and similar to on the prior exam.  Musculoskeletal: No acute osseous abnormality.  CT ABDOMEN AND PELVIS FINDINGS  Hepatobiliary: Normal liver. Normal gallbladder, without biliary ductal dilatation.  Pancreas: Normal, without mass or ductal dilatation.  Spleen: Normal in size, without focal abnormality.  Adrenals/Urinary Tract: Normal adrenal glands. Mild renal cortical thinning bilaterally. No hydronephrosis. Nondependent air within the urinary bladder on image 107.  Stomach/Bowel: Normal remainder of the stomach. Colonic stool burden suggests constipation. Normal terminal ileum and appendix. Normal small bowel.  Vascular/Lymphatic: Aortic and branch vessel atherosclerosis. No abdominopelvic adenopathy.  Reproductive: Normal uterus and adnexa.  Other: No significant free fluid.  Musculoskeletal: Mild osteopenia.  Advanced spondylosis.  IMPRESSION: 1. Treatment effects in the right hemithorax. Similar right infrahilar radiation induced fibrosis with complex right pleural fluid collection. 2. Centrilobular emphysema with scattered bilateral interstitial opacities which are similar. 3. Mild cardiomegaly. Atherosclerosis, including within the coronary arteries. 4. No acute process or evidence of metastatic disease in the abdomen or pelvis. 5.  Possible  constipation. 6. Trace air in the urinary bladder. Correlate with instrumentation or symptoms to suggest cystitis.   Electronically Signed   By: Abigail Miyamoto M.D.   On: 08/03/2015 11:44   Ct Chest Wo Contrast  08/03/2015   CLINICAL DATA:  Lung cancer diagnosed in 2008. Chemotherapy completed in 2011. Radiation therapy completed in 2008. Left parotid gland cancer diagnosed 41 years ago. Chest pain on exertion.  EXAM: CT CHEST, ABDOMEN AND PELVIS WITHOUT CONTRAST  TECHNIQUE: Multidetector CT imaging of the chest, abdomen and pelvis was performed following the standard protocol without IV contrast.  COMPARISON:  05/07/2015  FINDINGS: CT CHEST FINDINGS  Mediastinum/Nodes: Stable small supraclavicular nodes. Left-sided Port-A-Cath terminates at the low SVC. Subcentimeter right thyroid low-density nodules are unchanged and nonspecific. Aortic and branch vessel atherosclerosis. Mild cardiomegaly. Multivessel coronary artery atherosclerosis. No mediastinal or definite hilar adenopathy, given limitations of unenhanced CT. Small hiatal hernia.  Lungs/Pleura: Moderate centrilobular emphysema.  Scattered interstitial opacities in the right upper lobe are not significantly changed.  Right infrahilar consolidation is not significantly changed and likely radiation induced.  Minimal interstitial opacities in the left upper lobe, including on images 13 and 17 are not significantly changed.  Right-sided complex pleural effusion  with calcifications within. This is moderate in size and similar to on the prior exam.  Musculoskeletal: No acute osseous abnormality.  CT ABDOMEN AND PELVIS FINDINGS  Hepatobiliary: Normal liver. Normal gallbladder, without biliary ductal dilatation.  Pancreas: Normal, without mass or ductal dilatation.  Spleen: Normal in size, without focal abnormality.  Adrenals/Urinary Tract: Normal adrenal glands. Mild renal cortical thinning bilaterally. No hydronephrosis. Nondependent air within the urinary bladder on  image 107.  Stomach/Bowel: Normal remainder of the stomach. Colonic stool burden suggests constipation. Normal terminal ileum and appendix. Normal small bowel.  Vascular/Lymphatic: Aortic and branch vessel atherosclerosis. No abdominopelvic adenopathy.  Reproductive: Normal uterus and adnexa.  Other: No significant free fluid.  Musculoskeletal: Mild osteopenia.  Advanced spondylosis.  IMPRESSION: 1. Treatment effects in the right hemithorax. Similar right infrahilar radiation induced fibrosis with complex right pleural fluid collection. 2. Centrilobular emphysema with scattered bilateral interstitial opacities which are similar. 3. Mild cardiomegaly. Atherosclerosis, including within the coronary arteries. 4. No acute process or evidence of metastatic disease in the abdomen or pelvis. 5.  Possible constipation. 6. Trace air in the urinary bladder. Correlate with instrumentation or symptoms to suggest cystitis.   Electronically Signed   By: Abigail Miyamoto M.D.   On: 08/03/2015 11:44    ASSESSMENT AND PLAN: This is a very pleasant 74 years old white female with metastatic non-small cell lung cancer currently on treatment with Tarceva 150 mg by mouth daily status post 35 months of treatment and tolerating it fairly well except for dry skin and recent flare of her rash , now grade 2. The patient was discussed with and also seen by Dr. [name]. She was advised to hold her Tarceva for an additional week then resume at the current dose. She is further advised to complete her course of Bactrim as prescribed. Her recent restaging CT scan revealed stable disease. She will return for a followup visit in one month for reevaluation with repeat CBC and comprehensive metabolic panel. If she continues to have difficulty with the Tarceva at 150 mg we will consider decreasing the dose to 100 mg by mouth daily.  She will continue to have Port-A-Cath flush every 8 weeks. She was advised to call immediately if she has any  concerning symptoms in the interval.  The patient voices understanding of current disease status and treatment options and is in agreement with the current care plan.  All questions were answered. The patient knows to call the clinic with any problems, questions or concerns. We can certainly see the patient much sooner if necessary.  Carlton Adam PA-C   ADDENDUM: Hematology/Oncology Attending: I had a face to face encounter with the patient. I recommended her care plan. This is a very pleasant 74 years old white female with recurrent non-small cell lung cancer, adenocarcinoma is currently undergoing treatment with Tarceva status post 35 cycles. She is tolerating her treatment well except for mild skin rash. She has no significant diarrhea. The recent CT scan of the chest showed no evidence for disease progression. I discussed the scan results with the patient today. I recommended for her to continue her current treatment with Tarceva. She would come back for follow-up visit in one month's for reevaluation. She was advised to call immediately if she has any concerning symptoms in the interval.  Disclaimer: This note was dictated with voice recognition software. Similar sounding words can inadvertently be transcribed and may not be corrected upon review. Eilleen Kempf., MD 08/21/2015

## 2015-08-12 NOTE — Telephone Encounter (Signed)
s.w. pt and advised on OCT appt....pt ok and aware °

## 2015-08-12 NOTE — Telephone Encounter (Signed)
"  Please don't forget to call in the steroid so I can get this today."    Was seen today by Awilda Metro PA-C.

## 2015-08-18 NOTE — Patient Instructions (Signed)
Complete your course of antibiotics Hold your Tarceva for one week then resume Follow up in one month

## 2015-08-26 ENCOUNTER — Other Ambulatory Visit: Payer: Self-pay | Admitting: *Deleted

## 2015-08-26 DIAGNOSIS — C3490 Malignant neoplasm of unspecified part of unspecified bronchus or lung: Secondary | ICD-10-CM

## 2015-08-26 MED ORDER — ERLOTINIB HCL 150 MG PO TABS: 150.0000 mg | ORAL_TABLET | Freq: Every day | ORAL | Status: DC

## 2015-08-26 NOTE — Telephone Encounter (Signed)
Rx refill Tarceva faxed to [facility]

## 2015-09-09 ENCOUNTER — Ambulatory Visit (HOSPITAL_BASED_OUTPATIENT_CLINIC_OR_DEPARTMENT_OTHER): Payer: Medicare Other | Admitting: Internal Medicine

## 2015-09-09 ENCOUNTER — Encounter: Payer: Self-pay | Admitting: Internal Medicine

## 2015-09-09 ENCOUNTER — Telehealth: Payer: Self-pay | Admitting: Internal Medicine

## 2015-09-09 ENCOUNTER — Other Ambulatory Visit (HOSPITAL_BASED_OUTPATIENT_CLINIC_OR_DEPARTMENT_OTHER): Payer: Medicare Other

## 2015-09-09 ENCOUNTER — Ambulatory Visit (HOSPITAL_BASED_OUTPATIENT_CLINIC_OR_DEPARTMENT_OTHER): Payer: Medicare Other

## 2015-09-09 VITALS — BP 135/63 | HR 84 | Temp 97.4°F | Resp 18 | Ht 66.0 in | Wt 231.0 lb

## 2015-09-09 DIAGNOSIS — C3430 Malignant neoplasm of lower lobe, unspecified bronchus or lung: Secondary | ICD-10-CM

## 2015-09-09 DIAGNOSIS — R21 Rash and other nonspecific skin eruption: Secondary | ICD-10-CM

## 2015-09-09 DIAGNOSIS — C3490 Malignant neoplasm of unspecified part of unspecified bronchus or lung: Secondary | ICD-10-CM

## 2015-09-09 DIAGNOSIS — Z5111 Encounter for antineoplastic chemotherapy: Secondary | ICD-10-CM

## 2015-09-09 DIAGNOSIS — Z95828 Presence of other vascular implants and grafts: Secondary | ICD-10-CM

## 2015-09-09 DIAGNOSIS — C3491 Malignant neoplasm of unspecified part of right bronchus or lung: Secondary | ICD-10-CM

## 2015-09-09 LAB — CBC WITH DIFFERENTIAL/PLATELET
BASO%: 0.7 % (ref 0.0–2.0)
BASOS ABS: 0 10*3/uL (ref 0.0–0.1)
EOS ABS: 0.3 10*3/uL (ref 0.0–0.5)
EOS%: 6 % (ref 0.0–7.0)
HCT: 36.3 % (ref 34.8–46.6)
HGB: 11.8 g/dL (ref 11.6–15.9)
LYMPH%: 12.2 % — ABNORMAL LOW (ref 14.0–49.7)
MCH: 32 pg (ref 25.1–34.0)
MCHC: 32.5 g/dL (ref 31.5–36.0)
MCV: 98.4 fL (ref 79.5–101.0)
MONO#: 0.5 10*3/uL (ref 0.1–0.9)
MONO%: 10.4 % (ref 0.0–14.0)
NEUT#: 3.2 10*3/uL (ref 1.5–6.5)
NEUT%: 70.7 % (ref 38.4–76.8)
Platelets: 176 10*3/uL (ref 145–400)
RBC: 3.69 10*6/uL — AB (ref 3.70–5.45)
RDW: 14.1 % (ref 11.2–14.5)
WBC: 4.5 10*3/uL (ref 3.9–10.3)
lymph#: 0.6 10*3/uL — ABNORMAL LOW (ref 0.9–3.3)

## 2015-09-09 LAB — COMPREHENSIVE METABOLIC PANEL (CC13)
ALT: 9 U/L (ref 0–55)
AST: 13 U/L (ref 5–34)
Albumin: 3 g/dL — ABNORMAL LOW (ref 3.5–5.0)
Alkaline Phosphatase: 71 U/L (ref 40–150)
Anion Gap: 5 mEq/L (ref 3–11)
BUN: 20.1 mg/dL (ref 7.0–26.0)
CO2: 27 meq/L (ref 22–29)
Calcium: 9.5 mg/dL (ref 8.4–10.4)
Chloride: 108 mEq/L (ref 98–109)
Creatinine: 1.5 mg/dL — ABNORMAL HIGH (ref 0.6–1.1)
EGFR: 35 mL/min/{1.73_m2} — ABNORMAL LOW (ref >90)
GLUCOSE: 90 mg/dL (ref 70–140)
POTASSIUM: 4.5 meq/L (ref 3.5–5.1)
SODIUM: 140 meq/L (ref 136–145)
Total Bilirubin: 1.34 mg/dL — ABNORMAL HIGH (ref 0.20–1.20)
Total Protein: 6 g/dL — ABNORMAL LOW (ref 6.4–8.3)

## 2015-09-09 MED ORDER — SODIUM CHLORIDE 0.9 % IJ SOLN
10.0000 mL | INTRAMUSCULAR | Status: DC | PRN
  Administered 2015-09-09: 10 mL via INTRAVENOUS
  Filled 2015-09-09: qty 10

## 2015-09-09 MED ORDER — ERLOTINIB HCL 100 MG PO TABS: 100.0000 mg | ORAL_TABLET | Freq: Every day | ORAL | Status: DC

## 2015-09-09 MED ORDER — HEPARIN SOD (PORK) LOCK FLUSH 100 UNIT/ML IV SOLN
500.0000 [IU] | Freq: Once | INTRAVENOUS | Status: AC
  Administered 2015-09-09: 500 [IU] via INTRAVENOUS
  Filled 2015-09-09: qty 5

## 2015-09-09 NOTE — Patient Instructions (Signed)

## 2015-09-09 NOTE — Progress Notes (Signed)
[facility] [phone]   [phone]  OFFICE PROGRESS NOTE  [name], MD [address] [place] [address]  DIAGNOSIS: Recurrent non-small cell lung cancer, adenocarcinoma initially diagnosed as stage IIA in March 2008. The tumor cells were negative for EGFR mutation as well as ALK gene translocation.   PRIOR THERAPY:  1. Status post concurrent chemoradiation with weekly carboplatin and paclitaxel. Last dose was given Apr 12, 2007. 2. Status post 3 cycles of consolidation chemotherapy with docetaxel. Last dose was given July 12, 2007. 3. 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 4. Status post 39 cycles of maintenance Alimta at a dose of 500 mg/m2. Last dose was given February 25, 2010, discontinued secondary to persistent anemia, but the patient has stable disease.  CURRENT THERAPY: Tarceva 150 mg by mouth daily. Status post approximately 35 months of therapy.   CHEMOTHERAPY INTENT: Palliative  CURRENT # OF CHEMOTHERAPY CYCLES: 36 CURRENT ANTIEMETICS: Compazine when necessary  CURRENT SMOKING STATUS: Former smoker quit 11/27/2001  ORAL CHEMOTHERAPY AND CONSENT: Tarceva  CURRENT BISPHOSPHONATES USE: None  PAIN MANAGEMENT: 0/10  NARCOTICS INDUCED CONSTIPATION: None  LIVING WILL AND CODE STATUS: Full code initially but no prolonged resuscitation.   INTERVAL HISTORY: Diane [name] 74 y.o. female returns to the clinic today for routine monthly followup visit. She has been off treatment with Tarceva for the last few weeks because of the significant skin rash which completely resolved at this point. The patient is feeling fine today with no specific complaints. She has no significant fever or chills no nausea or vomiting. She has shortness of breath with exertion with no cough or hemoptysis. She denied having any significant weight loss or night sweats. She has no  nausea or vomiting. She had repeat CBC and comprehensive metabolic panel performed earlier today and she is here for evaluation and discussion of her lab results.  MEDICAL HISTORY: Past Medical History  Diagnosis Date  . Hypertension   . Renal insufficiency     stage 3 renal disease/ pt  . lung ca dx'd 02/2010  . 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  MEDICATIONS:  Current Outpatient Prescriptions  Medication Sig Dispense Refill  . aspirin 81 MG tablet Take 81 mg by mouth daily.      . calcitRIOL (ROCALTROL) 0.25 MCG capsule Take 0.25 mcg by mouth every Monday, Wednesday, and Friday.    . clindamycin (CLEOCIN-T) 1 % external solution Apply topically 2 (two) times daily. Apply as directed 60 mL 1  . erlotinib (TARCEVA) 150 MG tablet Take 1 tablet (150 mg total) by mouth daily. Take on an empty stomach 1 hour before meals or 2 hours after. 30 tablet 0  . labetalol (NORMODYNE) 200 MG tablet Take 200 mg by mouth 2 (two) times daily.      . Melatonin 5 MG CAPS Take 1 capsule by mouth.    . methylPREDNISolone (MEDROL DOSEPAK) 4 MG TBPK tablet Take as directed with food 21 tablet 0  . neomycin-polymyxin b-dexamethasone (MAXITROL) 3.5-10000-0.1 SUSP Place 1 drop into both eyes.     . polyethylene glycol (MIRALAX / GLYCOLAX) packet Take 17 g by mouth daily.    Marland Kitchen sulfamethoxazole-trimethoprim (BACTRIM DS,SEPTRA DS) 800-160 MG per tablet Take 1 tablet by mouth 2 (two) times daily.    Marland Kitchen torsemide (DEMADEX) 20 MG tablet Take 20 mg by  mouth daily.       No current facility-administered medications for this visit.   Facility-Administered Medications Ordered in Other Visits  Medication Dose Route Frequency Provider Last Rate Last Dose  . sodium chloride 0.9 % injection 10 mL  10 mL Intravenous PRN Curt Bears, MD   10 mL at 10/14/13 1030  . sodium chloride 0.9 % injection 10 mL  10 mL Intravenous PRN Curt Bears, MD   10 mL at 09/09/15 1024     SURGICAL HISTORY:  Past Surgical History  Procedure Laterality Date  . Portacath placement  11/03/08-[facility]    tip in mid SVC-E. Mansell    REVIEW OF SYSTEMS:  A comprehensive review of systems was negative except for: Integument/breast: positive for dryness   PHYSICAL EXAMINATION: General appearance: alert, cooperative and no distress Head: Normocephalic, without obvious abnormality, atraumatic Neck: no adenopathy, no carotid bruit, no JVD, supple, symmetrical, trachea midline and thyroid not enlarged, symmetric, no tenderness/mass/nodules Lymph nodes: Cervical, supraclavicular, and axillary nodes normal. Resp: clear to auscultation bilaterally and normal percussion bilaterally Back: symmetric, no curvature. ROM normal. No CVA tenderness. Cardio: regular rate and rhythm, S1, S2 normal, no murmur, click, rub or gallop GI: soft, non-tender; bowel sounds normal; no masses,  no organomegaly Extremities: extremities normal, atraumatic, no cyanosis or edema Neurologic: Alert and oriented X 3, normal strength and tone. Normal symmetric reflexes. Normal coordination and gait  ECOG PERFORMANCE STATUS: 1 - Symptomatic but completely ambulatory  There were no vitals taken for this visit.  LABORATORY DATA: Lab Results  Component Value Date   WBC 4.5 09/09/2015   HGB 11.8 09/09/2015   HCT 36.3 09/09/2015   MCV 98.4 09/09/2015   PLT 176 09/09/2015      Chemistry      Component Value Date/Time   NA 140 08/03/2015 0823   NA 137 11/14/2012 0816   NA 143 07/11/2012 0927   K 4.1 08/03/2015 0823   K 3.9 11/14/2012 0816   K 4.5 07/11/2012 0927   CL 102 05/08/2013 0851   CL 101 11/14/2012 0816   CL 101 07/11/2012 0927   CO2 29 08/03/2015 0823   CO2 28 11/14/2012 0816   CO2 29 07/11/2012 0927   BUN 21.2 08/03/2015 0823   BUN 17 11/14/2012 0816   BUN 16 07/11/2012 0927   CREATININE 1.5* 08/03/2015 0823   CREATININE 1.52* 11/14/2012 0816   CREATININE 1.5* 07/11/2012 0927       Component Value Date/Time   CALCIUM 10.0 08/03/2015 0823   CALCIUM 10.2 11/14/2012 0816   CALCIUM 10.0 07/11/2012 0927   ALKPHOS 63 08/03/2015 0823   ALKPHOS 74 11/14/2012 0816   ALKPHOS 71 07/11/2012 0927   AST 15 08/03/2015 0823   AST 16 11/14/2012 0816   AST 19 07/11/2012 0927   ALT 12 08/03/2015 0823   ALT 8 11/14/2012 0816   ALT 18 07/11/2012 0927   BILITOT 1.37* 08/03/2015 0823   BILITOT 1.2 11/14/2012 0816   BILITOT 0.80 07/11/2012 0927       RADIOGRAPHIC STUDIES: No results found.  ASSESSMENT AND PLAN: This is a very pleasant 74 years old white female with metastatic non-small cell lung cancer currently on treatment with Tarceva 150 mg by mouth daily status post 35 months of treatment and tolerating it fairly well except for dry skin and grade 1-2 skin rash on the face and fingers. She has been off treatment the last few weeks because of the persistent skin rash which has significantly improved.  I recommended for the patient to continue her current treatment with Tarceva but I will reduce the dose to 100 mg by mouth daily starting from the next cycle. She would come back for followup visit in one month for reevaluation with repeat CBC and comprehensive metabolic panel. She will continue to have Port-A-Cath flush every 8 weeks. She was advised to call immediately if she has any concerning symptoms in the interval.  The patient voices understanding of current disease status and treatment options and is in agreement with the current care plan.  All questions were answered. The patient knows to call the clinic with any problems, questions or concerns. We can certainly see the patient much sooner if necessary.  Disclaimer: This note was dictated with voice recognition software. Similar sounding words can inadvertently be transcribed and may not be corrected upon review.

## 2015-09-09 NOTE — Telephone Encounter (Signed)
Gave and printed appt sched and avs fo rpt; for NOV  °

## 2015-09-29 ENCOUNTER — Other Ambulatory Visit: Payer: Self-pay | Admitting: Medical Oncology

## 2015-09-29 DIAGNOSIS — C3490 Malignant neoplasm of unspecified part of unspecified bronchus or lung: Secondary | ICD-10-CM

## 2015-09-29 MED ORDER — ERLOTINIB HCL 100 MG PO TABS: 100.0000 mg | ORAL_TABLET | Freq: Every day | ORAL | Status: DC

## 2015-09-29 NOTE — Progress Notes (Signed)
Pt said [facility] Diane [name] er they never received faxed rx in October. Rx sent today .

## 2015-09-30 ENCOUNTER — Telehealth: Payer: Self-pay | Admitting: *Deleted

## 2015-09-30 DIAGNOSIS — C3490 Malignant neoplasm of unspecified part of unspecified bronchus or lung: Secondary | ICD-10-CM

## 2015-09-30 MED ORDER — ERLOTINIB HCL 100 MG PO TABS: 100.0000 mg | ORAL_TABLET | Freq: Every day | ORAL | Status: DC

## 2015-09-30 NOTE — Telephone Encounter (Signed)
"  I called yesterday and was told my Tarceva was faxed to [facility].  [facility] has not received it.  They said to send it electronically."  This nurse will re-send to [facility] at this time.

## 2015-09-30 NOTE — Telephone Encounter (Signed)
thanks

## 2015-10-06 ENCOUNTER — Telehealth: Payer: Self-pay | Admitting: Internal Medicine

## 2015-10-06 ENCOUNTER — Ambulatory Visit (HOSPITAL_BASED_OUTPATIENT_CLINIC_OR_DEPARTMENT_OTHER): Payer: Medicare Other | Admitting: Physician Assistant

## 2015-10-06 ENCOUNTER — Other Ambulatory Visit (HOSPITAL_BASED_OUTPATIENT_CLINIC_OR_DEPARTMENT_OTHER): Payer: Medicare Other

## 2015-10-06 ENCOUNTER — Encounter: Payer: Self-pay | Admitting: Physician Assistant

## 2015-10-06 VITALS — BP 158/65 | HR 95 | Temp 97.9°F | Resp 17 | Ht 66.0 in | Wt 238.1 lb

## 2015-10-06 DIAGNOSIS — Z5111 Encounter for antineoplastic chemotherapy: Secondary | ICD-10-CM

## 2015-10-06 DIAGNOSIS — C3491 Malignant neoplasm of unspecified part of right bronchus or lung: Secondary | ICD-10-CM

## 2015-10-06 DIAGNOSIS — C3430 Malignant neoplasm of lower lobe, unspecified bronchus or lung: Secondary | ICD-10-CM | POA: Diagnosis not present

## 2015-10-06 DIAGNOSIS — R21 Rash and other nonspecific skin eruption: Secondary | ICD-10-CM

## 2015-10-06 LAB — COMPREHENSIVE METABOLIC PANEL (CC13)
ALT: 11 U/L (ref 0–55)
AST: 16 U/L (ref 5–34)
Albumin: 3 g/dL — ABNORMAL LOW (ref 3.5–5.0)
Alkaline Phosphatase: 72 U/L (ref 40–150)
Anion Gap: 7 mEq/L (ref 3–11)
BILIRUBIN TOTAL: 0.99 mg/dL (ref 0.20–1.20)
BUN: 19.4 mg/dL (ref 7.0–26.0)
CO2: 28 mEq/L (ref 22–29)
Calcium: 9.5 mg/dL (ref 8.4–10.4)
Chloride: 105 mEq/L (ref 98–109)
Creatinine: 1.3 mg/dL — ABNORMAL HIGH (ref 0.6–1.1)
EGFR: 39 mL/min/{1.73_m2} — ABNORMAL LOW (ref >90)
GLUCOSE: 95 mg/dL (ref 70–140)
Potassium: 4.8 mEq/L (ref 3.5–5.1)
Sodium: 139 mEq/L (ref 136–145)
TOTAL PROTEIN: 5.9 g/dL — AB (ref 6.4–8.3)

## 2015-10-06 LAB — CBC WITH DIFFERENTIAL/PLATELET
BASO%: 0.9 % (ref 0.0–2.0)
Basophils Absolute: 0 10*3/uL (ref 0.0–0.1)
EOS%: 5 % (ref 0.0–7.0)
Eosinophils Absolute: 0.2 10*3/uL (ref 0.0–0.5)
HCT: 36 % (ref 34.8–46.6)
HGB: 12.1 g/dL (ref 11.6–15.9)
LYMPH%: 12.4 % — AB (ref 14.0–49.7)
MCH: 32.7 pg (ref 25.1–34.0)
MCHC: 33.5 g/dL (ref 31.5–36.0)
MCV: 97.7 fL (ref 79.5–101.0)
MONO#: 0.5 10*3/uL (ref 0.1–0.9)
MONO%: 10.8 % (ref 0.0–14.0)
NEUT#: 3.3 10*3/uL (ref 1.5–6.5)
NEUT%: 70.9 % (ref 38.4–76.8)
Platelets: 206 10*3/uL (ref 145–400)
RBC: 3.69 10*6/uL — ABNORMAL LOW (ref 3.70–5.45)
RDW: 15.6 % — ABNORMAL HIGH (ref 11.2–14.5)
WBC: 4.7 10*3/uL (ref 3.9–10.3)
lymph#: 0.6 10*3/uL — ABNORMAL LOW (ref 0.9–3.3)

## 2015-10-06 NOTE — Progress Notes (Signed)
Diane [name] Telephone:(336) [phone]   Fax:(336) [phone]  OFFICE PROGRESS NOTE  [name], MD [address] [place] [address]  DIAGNOSIS: Recurrent non-small cell lung cancer, adenocarcinoma initially diagnosed as stage IIA in March 2008. The tumor cells were negative for EGFR mutation as well as ALK gene translocation.   PRIOR THERAPY:  1. Status post concurrent chemoradiation with weekly carboplatin and paclitaxel. Last dose was given Apr 12, 2007. 2. Status post 3 cycles of consolidation chemotherapy with docetaxel. Last dose was given July 12, 2007. 3. 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 4. Status post 39 cycles of maintenance Alimta at a dose of 500 mg/m2. Last dose was given February 25, 2010, discontinued secondary to persistent anemia, but the patient has stable disease.  CURRENT THERAPY: Tarceva 150 mg by mouth daily. Status post approximately 35 months of therapy.   CHEMOTHERAPY INTENT: Palliative  CURRENT # OF CHEMOTHERAPY CYCLES: 36 CURRENT ANTIEMETICS: Compazine when necessary  CURRENT SMOKING STATUS: Former smoker quit 11/27/2001  ORAL CHEMOTHERAPY AND CONSENT: Tarceva  CURRENT BISPHOSPHONATES USE: None  PAIN MANAGEMENT: 0/10  NARCOTICS INDUCED CONSTIPATION: None  LIVING WILL AND CODE STATUS: Full code initially but no prolonged resuscitation.   INTERVAL HISTORY: Diane [name] 74 y.o. female returns to the clinic today for routine monthly followup visit. The patient is feeling fine today with no specific complaints. She began Cycle 36 chemotherapy with dose reduced Tarceva at 100 mg daily on 11/8, tolerating it well She has no significant fever or chills no nausea or vomiting. She has shortness of breath with exertion with no cough or hemoptysis. She denied having any weight loss or night sweats, her appetite is good. She has no nausea or vomiting.  Her  Tarceva related rash is improving  And she applies hydrocortisone and moisturizers to the affected areas with good control. She had repeat CBC and comprehensive metabolic panel performed earlier today which remained stable.  MEDICAL HISTORY: Past Medical History  Diagnosis Date  . Hypertension   . Renal insufficiency     stage 3 renal disease/ pt  . lung ca dx'd 02/2010  . 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  MEDICATIONS:  Current Outpatient Prescriptions  Medication Sig Dispense Refill  . aspirin 81 MG tablet Take 81 mg by mouth daily.      . calcitRIOL (ROCALTROL) 0.25 MCG capsule Take 0.25 mcg by mouth every Monday, Wednesday, and Friday.    . clindamycin (CLEOCIN-T) 1 % external solution Apply topically 2 (two) times daily. Apply as directed 60 mL 1  . erlotinib (TARCEVA) 100 MG tablet Take 1 tablet (100 mg total) by mouth daily. Take on an empty stomach 1 hour before meals or 2 hours after. 30 tablet 2  . labetalol (NORMODYNE) 200 MG tablet Take 200 mg by mouth 2 (two) times daily. Take 1 1/2 pills two times daily    . Melatonin 5 MG CAPS Take 1 capsule by mouth.    . methylPREDNISolone (MEDROL DOSEPAK) 4 MG TBPK tablet Take as directed with food (Patient not taking: Reported on 10/06/2015) 21 tablet 0  . neomycin-polymyxin b-dexamethasone (MAXITROL) 3.5-10000-0.1 SUSP Place 1 drop into both eyes.     . polyethylene glycol (MIRALAX / GLYCOLAX) packet Take 17 g by mouth daily.    Marland Kitchen sulfamethoxazole-trimethoprim (BACTRIM DS,SEPTRA DS) 800-160 MG per tablet Take  1 tablet by mouth 2 (two) times daily.    Marland Kitchen torsemide (DEMADEX) 20 MG tablet Take 20 mg by mouth daily.       No current facility-administered medications for this visit.   Facility-Administered Medications Ordered in Other Visits  Medication Dose Route Frequency Provider Last Rate Last Dose  . sodium chloride 0.9 % injection 10 mL  10 mL Intravenous PRN Curt Bears, MD   10 mL at 10/14/13 1030    SURGICAL HISTORY:  Past Surgical History  Procedure Laterality Date  . Portacath placement  11/03/08-[facility]    tip in mid SVC-E. Mansell    REVIEW OF SYSTEMS:  A comprehensive review of systems was negative except for: Integument/breast: positive for dryness   PHYSICAL EXAMINATION: General appearance: alert, cooperative and no distress Head: Normocephalic, without obvious abnormality, atraumatic Neck: no adenopathy, no carotid bruit, no JVD, supple, symmetrical, trachea midline and thyroid not enlarged, symmetric, no tenderness/mass/nodules Lymph nodes: Cervical, supraclavicular, and axillary nodes normal. Resp: clear to auscultation bilaterally and normal percussion bilaterally Back: symmetric, no curvature. ROM normal. No CVA tenderness. Cardio: regular rate and rhythm, S1, S2 normal, no murmur, click, rub or gallop GI: soft, non-tender; bowel sounds normal; no masses,  no organomegaly Extremities: extremities normal, atraumatic, no cyanosis or edema. Rash has improved Neurologic: Alert and oriented X 3, normal strength and tone. Normal symmetric reflexes. Normal coordination and gait  ECOG PERFORMANCE STATUS: 1 - Symptomatic but completely ambulatory  Blood pressure 158/65, pulse 95, temperature 97.9 F (36.6 C), temperature source Oral, resp. rate 17, height $RemoveBe'5\' 6"'kTjzlazVq$  (1.676 m), weight 238 lb 1.6 oz (108.001 kg), SpO2 99 %.  LABORATORY DATA: CBC Latest Ref Rng 10/06/2015 09/09/2015 08/03/2015  WBC 3.9 - 10.3 10e3/uL 4.7 4.5 5.9  Hemoglobin 11.6 - 15.9 g/dL 12.1 11.8 12.5  Hematocrit 34.8 - 46.6 % 36.0 36.3 37.0  Platelets 145 - 400 10e3/uL 206 176 185     CMP Latest Ref Rng 10/06/2015 09/09/2015 08/03/2015  Glucose 70 - 140 mg/dl 95 90 103  BUN 7.0 - 26.0 mg/dL 19.4 20.1 21.2  Creatinine 0.6 - 1.1 mg/dL 1.3(H) 1.5(H) 1.5(H)  Sodium 136 - 145 mEq/L 139 140 140  Potassium 3.5 - 5.1 mEq/L 4.8 4.5 4.1  Chloride 98 - 107 mEq/L - - -  CO2 22 - 29  mEq/L $Remo'28 27 29  'owMpv$ Calcium 8.4 - 10.4 mg/dL 9.5 9.5 10.0  Total Protein 6.4 - 8.3 g/dL 5.9(L) 6.0(L) 6.1(L)  Total Bilirubin 0.20 - 1.20 mg/dL 0.99 1.34(H) 1.37(H)  Alkaline Phos 40 - 150 U/L 72 71 63  AST 5 - 34 U/L $Remo'16 13 15  'Hyood$ ALT 0 - 55 U/L $Remo'11 9 12    'VPIWq$ RADIOGRAPHIC STUDIES: No results found.  ASSESSMENT AND PLAN: This is a very pleasant 74 years old white female with metastatic non-small cell lung cancer currently on treatment with Tarceva 150 mg by mouth daily status post 36 months of treatment and tolerating it fairly well except for dry skin and grade 1-2 skin rash on the face and fingers. I recommended for the patient to continue her current treatment with Tarceva at 100 mg by mouth daily, cycle 36 started on 11/8. She would come back for followup visit in one month for reevaluation with repeat CBC and comprehensive metabolic panel. She will continue to have Port-A-Cath flush every 8 weeks. She is scheduled for CT scans in 1 month She was advised to call immediately if she has any concerning symptoms in the interval.  The patient voices understanding of current disease status and treatment options and is in agreement with the current care plan.  All questions were answered. The patient knows to call the clinic with any problems, questions or concerns. We can certainly see the patient much sooner if necessary.  ADDENDUM: Hematology/Oncology Attending: I had a face to face encounter with the patient today. I recommended her care plan. This is a very pleasant 74 years old white female with recurrent non-small cell lung cancer, adenocarcinoma. She is currently on treatment with Tarceva 150 mg by mouth daily and tolerating her treatment fairly well with no significant adverse effects except for mild skin rash on the lower extremities. I recommended for the patient to continue her treatment as scheduled. The patient would come back for follow-up visit in one month's for reevaluation after  repeating CT scan of the chest for restaging of her disease. She was advised to call immediately if she has any concerning symptoms in the interval.  Disclaimer: This note was dictated with voice recognition software. Similar sounding words can inadvertently be transcribed and may be missed upon review. Eilleen Kempf., MD 10/06/2015

## 2015-10-06 NOTE — Telephone Encounter (Signed)
Gave patient avs report and appointments for December. Message back to SW/MM re ct. Per patient she should have a ct before seeing MM again in December - no order. Patient aware if she needs ct - central will call re appointment once MM enters order.

## 2015-11-03 ENCOUNTER — Other Ambulatory Visit: Payer: Self-pay | Admitting: Medical Oncology

## 2015-11-03 DIAGNOSIS — C3491 Malignant neoplasm of unspecified part of right bronchus or lung: Secondary | ICD-10-CM

## 2015-11-04 ENCOUNTER — Ambulatory Visit (HOSPITAL_BASED_OUTPATIENT_CLINIC_OR_DEPARTMENT_OTHER): Payer: Medicare Other

## 2015-11-04 ENCOUNTER — Other Ambulatory Visit (HOSPITAL_BASED_OUTPATIENT_CLINIC_OR_DEPARTMENT_OTHER): Payer: Medicare Other

## 2015-11-04 DIAGNOSIS — C3430 Malignant neoplasm of lower lobe, unspecified bronchus or lung: Secondary | ICD-10-CM

## 2015-11-04 DIAGNOSIS — C3491 Malignant neoplasm of unspecified part of right bronchus or lung: Secondary | ICD-10-CM

## 2015-11-04 DIAGNOSIS — Z95828 Presence of other vascular implants and grafts: Secondary | ICD-10-CM

## 2015-11-04 LAB — COMPREHENSIVE METABOLIC PANEL
ALT: 9 U/L (ref 0–55)
ANION GAP: 12 meq/L — AB (ref 3–11)
AST: 18 U/L (ref 5–34)
Albumin: 3.2 g/dL — ABNORMAL LOW (ref 3.5–5.0)
Alkaline Phosphatase: 69 U/L (ref 40–150)
BILIRUBIN TOTAL: 1.87 mg/dL — AB (ref 0.20–1.20)
BUN: 23.4 mg/dL (ref 7.0–26.0)
CO2: 23 meq/L (ref 22–29)
CREATININE: 1.5 mg/dL — AB (ref 0.6–1.1)
Calcium: 9.8 mg/dL (ref 8.4–10.4)
Chloride: 106 mEq/L (ref 98–109)
EGFR: 33 mL/min/{1.73_m2} — ABNORMAL LOW (ref >90)
GLUCOSE: 97 mg/dL (ref 70–140)
Potassium: 4.6 mEq/L (ref 3.5–5.1)
SODIUM: 141 meq/L (ref 136–145)
TOTAL PROTEIN: 6.4 g/dL (ref 6.4–8.3)

## 2015-11-04 LAB — CBC WITH DIFFERENTIAL/PLATELET
BASO%: 0.9 % (ref 0.0–2.0)
Basophils Absolute: 0 10*3/uL (ref 0.0–0.1)
EOS%: 5.1 % (ref 0.0–7.0)
Eosinophils Absolute: 0.2 10*3/uL (ref 0.0–0.5)
HCT: 37.2 % (ref 34.8–46.6)
HEMOGLOBIN: 12.6 g/dL (ref 11.6–15.9)
LYMPH#: 0.5 10*3/uL — AB (ref 0.9–3.3)
LYMPH%: 11.4 % — AB (ref 14.0–49.7)
MCH: 33.6 pg (ref 25.1–34.0)
MCHC: 33.8 g/dL (ref 31.5–36.0)
MCV: 99.4 fL (ref 79.5–101.0)
MONO#: 0.5 10*3/uL (ref 0.1–0.9)
MONO%: 10.4 % (ref 0.0–14.0)
NEUT%: 72.2 % (ref 38.4–76.8)
NEUTROS ABS: 3.5 10*3/uL (ref 1.5–6.5)
PLATELETS: 188 10*3/uL (ref 145–400)
RBC: 3.74 10*6/uL (ref 3.70–5.45)
RDW: 15 % — AB (ref 11.2–14.5)
WBC: 4.8 10*3/uL (ref 3.9–10.3)

## 2015-11-04 MED ORDER — SODIUM CHLORIDE 0.9 % IJ SOLN
10.0000 mL | INTRAMUSCULAR | Status: DC | PRN
  Administered 2015-11-04: 10 mL via INTRAVENOUS
  Filled 2015-11-04: qty 10

## 2015-11-04 MED ORDER — HEPARIN SOD (PORK) LOCK FLUSH 100 UNIT/ML IV SOLN
500.0000 [IU] | Freq: Once | INTRAVENOUS | Status: AC
  Administered 2015-11-04: 500 [IU] via INTRAVENOUS
  Filled 2015-11-04: qty 5

## 2015-11-04 NOTE — Patient Instructions (Signed)

## 2015-11-05 ENCOUNTER — Ambulatory Visit (HOSPITAL_COMMUNITY): Payer: Medicare Other

## 2015-11-08 ENCOUNTER — Ambulatory Visit (HOSPITAL_BASED_OUTPATIENT_CLINIC_OR_DEPARTMENT_OTHER): Payer: Medicare Other | Admitting: Internal Medicine

## 2015-11-08 ENCOUNTER — Encounter: Payer: Self-pay | Admitting: Internal Medicine

## 2015-11-08 ENCOUNTER — Telehealth: Payer: Self-pay | Admitting: Internal Medicine

## 2015-11-08 VITALS — BP 141/55 | HR 85 | Temp 98.1°F | Resp 19 | Ht 66.0 in | Wt 239.5 lb

## 2015-11-08 DIAGNOSIS — Z5111 Encounter for antineoplastic chemotherapy: Secondary | ICD-10-CM

## 2015-11-08 DIAGNOSIS — C3491 Malignant neoplasm of unspecified part of right bronchus or lung: Secondary | ICD-10-CM | POA: Diagnosis not present

## 2015-11-08 MED ORDER — METHYLPREDNISOLONE 4 MG PO TBPK: ORAL_TABLET | ORAL | Status: DC

## 2015-11-08 NOTE — Progress Notes (Signed)
[facility] [facility] Telephone:(336) [phone]   Fax:(336) [phone]  OFFICE PROGRESS NOTE  [name], MD [address] [place] [address]  DIAGNOSIS: Recurrent non-small cell lung cancer, adenocarcinoma initially diagnosed as stage IIA in March 2008. The tumor cells were negative for EGFR mutation as well as ALK gene translocation.   PRIOR THERAPY:  1. Status post concurrent chemoradiation with weekly carboplatin and paclitaxel. Last dose was given Apr 12, 2007. 2. Status post 3 cycles of consolidation chemotherapy with docetaxel. Last dose was given July 12, 2007. 3. 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 4. Status post 39 cycles of maintenance Alimta at a dose of 500 mg/m2. Last dose was given February 25, 2010, discontinued secondary to persistent anemia, but the patient has stable disease.  CURRENT THERAPY: Tarceva 150 mg by mouth daily. Status post approximately 36 months of therapy.   CHEMOTHERAPY INTENT: Palliative  CURRENT # OF CHEMOTHERAPY CYCLES: 37 CURRENT ANTIEMETICS: Compazine when necessary  CURRENT SMOKING STATUS: Former smoker quit 11/27/2001  ORAL CHEMOTHERAPY AND CONSENT: Tarceva  CURRENT BISPHOSPHONATES USE: None  PAIN MANAGEMENT: 0/10  NARCOTICS INDUCED CONSTIPATION: None  LIVING WILL AND CODE STATUS: Full code initially but no prolonged resuscitation.   INTERVAL HISTORY: Diane [name] 74 y.o. female returns to the clinic today for routine monthly followup visit. She is tolerating her treatment with Tarceva fairly well except for the persistent skin rash on the legs. The patient is feeling fine today with no specific complaints. She has no significant fever or chills no nausea or vomiting. She has shortness of breath with exertion with no cough or hemoptysis. She denied having any significant weight loss or night sweats. She has no nausea or vomiting. She had repeat  CBC and comprehensive metabolic panel performed earlier today and she is here for evaluation and discussion of her lab results.  MEDICAL HISTORY: Past Medical History  Diagnosis Date  . Hypertension   . Renal insufficiency     stage 3 renal disease/ pt  . lung ca dx'd 02/2010  . 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  MEDICATIONS:  Current Outpatient Prescriptions  Medication Sig Dispense Refill  . aspirin 81 MG tablet Take 81 mg by mouth daily.      . calcitRIOL (ROCALTROL) 0.25 MCG capsule Take 0.25 mcg by mouth every Monday, Wednesday, and Friday.    . clindamycin (CLEOCIN-T) 1 % external solution Apply topically 2 (two) times daily. Apply as directed 60 mL 1  . erlotinib (TARCEVA) 100 MG tablet Take 1 tablet (100 mg total) by mouth daily. Take on an empty stomach 1 hour before meals or 2 hours after. 30 tablet 2  . labetalol (NORMODYNE) 200 MG tablet Take 200 mg by mouth 2 (two) times daily. Take 1 1/2 pills two times daily    . Melatonin 5 MG CAPS Take 1 capsule by mouth.    . methylPREDNISolone (MEDROL DOSEPAK) 4 MG TBPK tablet Take as directed with food (Patient not taking: Reported on 10/06/2015) 21 tablet 0  . neomycin-polymyxin b-dexamethasone (MAXITROL) 3.5-10000-0.1 SUSP Place 1 drop into both eyes.     . polyethylene glycol (MIRALAX / GLYCOLAX) packet Take 17 g by mouth daily.    Marland Kitchen sulfamethoxazole-trimethoprim (BACTRIM DS,SEPTRA DS) 800-160 MG per tablet Take 1 tablet by mouth 2 (two) times daily.    Marland Kitchen torsemide (DEMADEX) 20  MG tablet Take 20 mg by mouth daily.       No current facility-administered medications for this visit.   Facility-Administered Medications Ordered in Other Visits  Medication Dose Route Frequency Provider Last Rate Last Dose  . sodium chloride 0.9 % injection 10 mL  10 mL Intravenous PRN Curt Bears, MD   10 mL at 10/14/13 1030    SURGICAL HISTORY:  Past Surgical History  Procedure  Laterality Date  . Portacath placement  11/03/08-[facility]    tip in mid SVC-E. Mansell    REVIEW OF SYSTEMS:  A comprehensive review of systems was negative except for: Integument/breast: positive for dryness and rash   PHYSICAL EXAMINATION: General appearance: alert, cooperative and no distress Head: Normocephalic, without obvious abnormality, atraumatic Neck: no adenopathy, no carotid bruit, no JVD, supple, symmetrical, trachea midline and thyroid not enlarged, symmetric, no tenderness/mass/nodules Lymph nodes: Cervical, supraclavicular, and axillary nodes normal. Resp: clear to auscultation bilaterally and normal percussion bilaterally Back: symmetric, no curvature. ROM normal. No CVA tenderness. Cardio: regular rate and rhythm, S1, S2 normal, no murmur, click, rub or gallop GI: soft, non-tender; bowel sounds normal; no masses,  no organomegaly Extremities: extremities normal, atraumatic, no cyanosis or edema Neurologic: Alert and oriented X 3, normal strength and tone. Normal symmetric reflexes. Normal coordination and gait  ECOG PERFORMANCE STATUS: 1 - Symptomatic but completely ambulatory  Blood pressure 141/55, pulse 85, temperature 98.1 F (36.7 C), temperature source Oral, resp. rate 19, height $RemoveBe'5\' 6"'ZuonEkVKg$  (1.676 m), weight 239 lb 8 oz (108.636 kg), SpO2 96 %.  LABORATORY DATA: Lab Results  Component Value Date   WBC 4.8 11/04/2015   HGB 12.6 11/04/2015   HCT 37.2 11/04/2015   MCV 99.4 11/04/2015   PLT 188 11/04/2015      Chemistry      Component Value Date/Time   NA 141 11/04/2015 1126   NA 137 11/14/2012 0816   NA 143 07/11/2012 0927   K 4.6 11/04/2015 1126   K 3.9 11/14/2012 0816   K 4.5 07/11/2012 0927   CL 102 05/08/2013 0851   CL 101 11/14/2012 0816   CL 101 07/11/2012 0927   CO2 23 11/04/2015 1126   CO2 28 11/14/2012 0816   CO2 29 07/11/2012 0927   BUN 23.4 11/04/2015 1126   BUN 17 11/14/2012 0816   BUN 16 07/11/2012 0927   CREATININE 1.5* 11/04/2015 1126    CREATININE 1.52* 11/14/2012 0816   CREATININE 1.5* 07/11/2012 0927      Component Value Date/Time   CALCIUM 9.8 11/04/2015 1126   CALCIUM 10.2 11/14/2012 0816   CALCIUM 10.0 07/11/2012 0927   ALKPHOS 69 11/04/2015 1126   ALKPHOS 74 11/14/2012 0816   ALKPHOS 71 07/11/2012 0927   AST 18 11/04/2015 1126   AST 16 11/14/2012 0816   AST 19 07/11/2012 0927   ALT 9 11/04/2015 1126   ALT 8 11/14/2012 0816   ALT 18 07/11/2012 0927   BILITOT 1.87* 11/04/2015 1126   BILITOT 1.2 11/14/2012 0816   BILITOT 0.80 07/11/2012 0927       RADIOGRAPHIC STUDIES: No results found.  ASSESSMENT AND PLAN: This is a very pleasant 74 years old white female with metastatic non-small cell lung cancer currently on treatment with Tarceva 150 mg by mouth daily status post 36 months of treatment and tolerating it fairly well except for dry skin and grade 1-2 skin rash on the face and fingers.  I recommended for the patient to continue her treatment with Tarceva  with the same dose. She is scheduled to have repeat CT scan of the chest, abdomen and pelvis without contrast on 11/11/2015. She would come back for followup visit in one month for reevaluation with repeat CBC and comprehensive metabolic panel. For the skin rash, I will start the patient on Medrol Dosepak. She will continue to have Port-A-Cath flush every 8 weeks. She was advised to call immediately if she has any concerning symptoms in the interval.  The patient voices understanding of current disease status and treatment options and is in agreement with the current care plan.  All questions were answered. The patient knows to call the clinic with any problems, questions or concerns. We can certainly see the patient much sooner if necessary.  Disclaimer: This note was dictated with voice recognition software. Similar sounding words can inadvertently be transcribed and may not be corrected upon review.

## 2015-11-08 NOTE — Telephone Encounter (Signed)
per pof to sch pt appt-gave pt copy of avs °

## 2015-11-11 ENCOUNTER — Encounter (HOSPITAL_COMMUNITY): Payer: Self-pay

## 2015-11-11 ENCOUNTER — Other Ambulatory Visit: Payer: Self-pay | Admitting: Physician Assistant

## 2015-11-11 ENCOUNTER — Ambulatory Visit (HOSPITAL_COMMUNITY)
Admission: RE | Admit: 2015-11-11 | Discharge: 2015-11-11 | Disposition: A | Discharge status: Home / Self Care | Payer: Medicare Other | Source: Ambulatory Visit | Attending: Physician Assistant | Admitting: Physician Assistant

## 2015-11-11 DIAGNOSIS — M5135 Other intervertebral disc degeneration, thoracolumbar region: Secondary | ICD-10-CM | POA: Diagnosis not present

## 2015-11-11 DIAGNOSIS — C3491 Malignant neoplasm of unspecified part of right bronchus or lung: Secondary | ICD-10-CM

## 2015-11-11 DIAGNOSIS — I700 Atherosclerosis of aorta: Secondary | ICD-10-CM | POA: Insufficient documentation

## 2015-11-11 DIAGNOSIS — Z8589 Personal history of malignant neoplasm of other organs and systems: Secondary | ICD-10-CM | POA: Diagnosis not present

## 2015-11-11 DIAGNOSIS — Z5111 Encounter for antineoplastic chemotherapy: Secondary | ICD-10-CM

## 2015-11-11 DIAGNOSIS — R918 Other nonspecific abnormal finding of lung field: Secondary | ICD-10-CM | POA: Diagnosis not present

## 2015-11-11 DIAGNOSIS — I517 Cardiomegaly: Secondary | ICD-10-CM | POA: Insufficient documentation

## 2015-11-11 DIAGNOSIS — M47815 Spondylosis without myelopathy or radiculopathy, thoracolumbar region: Secondary | ICD-10-CM | POA: Diagnosis not present

## 2015-11-11 DIAGNOSIS — M47816 Spondylosis without myelopathy or radiculopathy, lumbar region: Secondary | ICD-10-CM | POA: Diagnosis not present

## 2015-11-11 DIAGNOSIS — J90 Pleural effusion, not elsewhere classified: Secondary | ICD-10-CM | POA: Insufficient documentation

## 2015-11-11 DIAGNOSIS — I2510 Atherosclerotic heart disease of native coronary artery without angina pectoris: Secondary | ICD-10-CM | POA: Diagnosis not present

## 2015-11-11 MED ORDER — IOHEXOL 300 MG/ML  SOLN
50.0000 mL | INTRAMUSCULAR | Status: DC | PRN
  Administered 2015-11-11: 50 mL via ORAL
  Filled 2015-11-11: qty 50

## 2015-11-12 ENCOUNTER — Telehealth: Payer: Self-pay

## 2015-11-12 NOTE — Telephone Encounter (Signed)
sw pt that CT report said stable appearance for no metastatic disease. Did not report on other findings.

## 2015-12-09 ENCOUNTER — Other Ambulatory Visit: Payer: Self-pay | Admitting: Medical Oncology

## 2015-12-09 ENCOUNTER — Telehealth: Payer: Self-pay | Admitting: Internal Medicine

## 2015-12-09 ENCOUNTER — Other Ambulatory Visit (HOSPITAL_BASED_OUTPATIENT_CLINIC_OR_DEPARTMENT_OTHER): Payer: Medicare Other

## 2015-12-09 ENCOUNTER — Encounter: Payer: Self-pay | Admitting: Internal Medicine

## 2015-12-09 ENCOUNTER — Ambulatory Visit (HOSPITAL_BASED_OUTPATIENT_CLINIC_OR_DEPARTMENT_OTHER): Payer: Medicare Other | Admitting: Internal Medicine

## 2015-12-09 VITALS — BP 131/54 | HR 86 | Temp 98.1°F | Resp 17 | Ht 66.0 in | Wt 243.2 lb

## 2015-12-09 DIAGNOSIS — C3490 Malignant neoplasm of unspecified part of unspecified bronchus or lung: Secondary | ICD-10-CM

## 2015-12-09 DIAGNOSIS — R21 Rash and other nonspecific skin eruption: Secondary | ICD-10-CM | POA: Diagnosis not present

## 2015-12-09 DIAGNOSIS — L03039 Cellulitis of unspecified toe: Secondary | ICD-10-CM | POA: Diagnosis not present

## 2015-12-09 DIAGNOSIS — C3491 Malignant neoplasm of unspecified part of right bronchus or lung: Secondary | ICD-10-CM

## 2015-12-09 DIAGNOSIS — Z5111 Encounter for antineoplastic chemotherapy: Secondary | ICD-10-CM

## 2015-12-09 DIAGNOSIS — L03019 Cellulitis of unspecified finger: Secondary | ICD-10-CM

## 2015-12-09 LAB — COMPREHENSIVE METABOLIC PANEL
ALBUMIN: 3 g/dL — AB (ref 3.5–5.0)
ALK PHOS: 71 U/L (ref 40–150)
ALT: 10 U/L (ref 0–55)
AST: 14 U/L (ref 5–34)
Anion Gap: 8 mEq/L (ref 3–11)
BILIRUBIN TOTAL: 1.35 mg/dL — AB (ref 0.20–1.20)
BUN: 17.8 mg/dL (ref 7.0–26.0)
CALCIUM: 9.6 mg/dL (ref 8.4–10.4)
CO2: 28 mEq/L (ref 22–29)
CREATININE: 1.5 mg/dL — AB (ref 0.6–1.1)
Chloride: 104 mEq/L (ref 98–109)
EGFR: 34 mL/min/{1.73_m2} — ABNORMAL LOW (ref >90)
GLUCOSE: 83 mg/dL (ref 70–140)
POTASSIUM: 4.3 meq/L (ref 3.5–5.1)
SODIUM: 140 meq/L (ref 136–145)
TOTAL PROTEIN: 6 g/dL — AB (ref 6.4–8.3)

## 2015-12-09 LAB — CBC WITH DIFFERENTIAL/PLATELET
BASO%: 0.8 % (ref 0.0–2.0)
Basophils Absolute: 0 10*3/uL (ref 0.0–0.1)
EOS%: 5.7 % (ref 0.0–7.0)
Eosinophils Absolute: 0.3 10*3/uL (ref 0.0–0.5)
HEMATOCRIT: 37.4 % (ref 34.8–46.6)
HEMOGLOBIN: 12.6 g/dL (ref 11.6–15.9)
LYMPH#: 0.5 10*3/uL — AB (ref 0.9–3.3)
LYMPH%: 10.9 % — ABNORMAL LOW (ref 14.0–49.7)
MCH: 33.4 pg (ref 25.1–34.0)
MCHC: 33.8 g/dL (ref 31.5–36.0)
MCV: 98.9 fL (ref 79.5–101.0)
MONO#: 0.5 10*3/uL (ref 0.1–0.9)
MONO%: 10.8 % (ref 0.0–14.0)
NEUT%: 71.8 % (ref 38.4–76.8)
NEUTROS ABS: 3.5 10*3/uL (ref 1.5–6.5)
Platelets: 184 10*3/uL (ref 145–400)
RBC: 3.78 10*6/uL (ref 3.70–5.45)
RDW: 13.6 % (ref 11.2–14.5)
WBC: 4.8 10*3/uL (ref 3.9–10.3)

## 2015-12-09 MED ORDER — ERLOTINIB HCL 100 MG PO TABS: 100.0000 mg | ORAL_TABLET | Freq: Every day | ORAL | Status: DC

## 2015-12-09 MED ORDER — DOXYCYCLINE HYCLATE 100 MG PO TABS: 100.0000 mg | ORAL_TABLET | Freq: Two times a day (BID) | ORAL | Status: DC

## 2015-12-09 NOTE — Telephone Encounter (Signed)
Gave patient avs report and appointments for February thru November.

## 2015-12-09 NOTE — Progress Notes (Signed)
[facility] [phone]) [phone]   [phone]  OFFICE PROGRESS NOTE  [name], MD [address] [place] [address]  DIAGNOSIS: Recurrent non-small cell lung cancer, adenocarcinoma initially diagnosed as stage IIA in March 2008. The tumor cells were negative for EGFR mutation as well as ALK gene translocation.   PRIOR THERAPY:  1. Status post concurrent chemoradiation with weekly carboplatin and paclitaxel. Last dose was given Apr 12, 2007. 2. Status post 3 cycles of consolidation chemotherapy with docetaxel. Last dose was given July 12, 2007. 3. 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 4. Status post 39 cycles of maintenance Alimta at a dose of 500 mg/m2. Last dose was given February 25, 2010, discontinued secondary to persistent anemia, but the patient has stable disease.  CURRENT THERAPY: Tarceva 150 mg by mouth daily. Status post approximately 37 months of therapy.   CHEMOTHERAPY INTENT: Palliative  CURRENT # OF CHEMOTHERAPY CYCLES: 38 CURRENT ANTIEMETICS: Compazine when necessary  CURRENT SMOKING STATUS: Former smoker quit 11/27/2001  ORAL CHEMOTHERAPY AND CONSENT: Tarceva  CURRENT BISPHOSPHONATES USE: None  PAIN MANAGEMENT: 0/10  NARCOTICS INDUCED CONSTIPATION: None  LIVING WILL AND CODE STATUS: Full code initially but no prolonged resuscitation.   INTERVAL HISTORY: JUANA HARALSON 75 y.o. female returns to the clinic today for routine monthly followup visit. She is tolerating her treatment with Tarceva fairly well except for the persistent skin rash on the legs. She also has some paronychia on the fingernails The patient is feeling fine today with no other complaints. She has no significant fever or chills no nausea or vomiting. She has shortness of breath with exertion with no cough or hemoptysis. She denied having any significant weight loss or night sweats. She  has no nausea or vomiting. She had repeat CBC and comprehensive metabolic panel performed earlier today and she is here for evaluation and discussion of her lab results.  MEDICAL HISTORY: Past Medical History  Diagnosis Date  . Hypertension   . Renal insufficiency     stage 3 renal disease/ pt  . lung ca dx'd 02/2010  . 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  MEDICATIONS:  Current Outpatient Prescriptions  Medication Sig Dispense Refill  . aspirin 81 MG tablet Take 81 mg by mouth daily.      . calcitRIOL (ROCALTROL) 0.25 MCG capsule Take 0.25 mcg by mouth every Monday, Wednesday, and Friday.    . clindamycin (CLEOCIN-T) 1 % external solution Apply topically 2 (two) times daily. Apply as directed (Patient not taking: Reported on 11/08/2015) 60 mL 1  . erlotinib (TARCEVA) 100 MG tablet Take 1 tablet (100 mg total) by mouth daily. Take on an empty stomach 1 hour before meals or 2 hours after. 30 tablet 2  . labetalol (NORMODYNE) 200 MG tablet Take 200 mg by mouth 2 (two) times daily. Take 1 1/2 pills two times daily    . methylPREDNISolone (MEDROL DOSEPAK) 4 MG TBPK tablet Use as instructed 21 tablet 0  . neomycin-polymyxin b-dexamethasone (MAXITROL) 3.5-10000-0.1 SUSP Place 1 drop into both eyes.     . polyethylene glycol (MIRALAX / GLYCOLAX) packet Take 17 g by mouth daily.    Marland Kitchen torsemide (DEMADEX) 20 MG tablet Take 20 mg by mouth daily.       No current facility-administered medications for this visit.   Facility-Administered Medications Ordered in Other  Visits  Medication Dose Route Frequency Provider Last Rate Last Dose  . sodium chloride 0.9 % injection 10 mL  10 mL Intravenous PRN Curt Bears, MD   10 mL at 10/14/13 1030    SURGICAL HISTORY:  Past Surgical History  Procedure Laterality Date  . Portacath placement  11/03/08-[facility]    tip in mid SVC-E. Mansell    REVIEW OF SYSTEMS:  A comprehensive review of systems  was negative except for: Integument/breast: positive for dryness and rash Paronychia of the fingernails   PHYSICAL EXAMINATION: General appearance: alert, cooperative and no distress Head: Normocephalic, without obvious abnormality, atraumatic Neck: no adenopathy, no carotid bruit, no JVD, supple, symmetrical, trachea midline and thyroid not enlarged, symmetric, no tenderness/mass/nodules Lymph nodes: Cervical, supraclavicular, and axillary nodes normal. Resp: clear to auscultation bilaterally and normal percussion bilaterally Back: symmetric, no curvature. ROM normal. No CVA tenderness. Cardio: regular rate and rhythm, S1, S2 normal, no murmur, click, rub or gallop GI: soft, non-tender; bowel sounds normal; no masses,  no organomegaly Extremities: extremities normal, atraumatic, no cyanosis or edema Neurologic: Alert and oriented X 3, normal strength and tone. Normal symmetric reflexes. Normal coordination and gait  ECOG PERFORMANCE STATUS: 1 - Symptomatic but completely ambulatory  There were no vitals taken for this visit.  LABORATORY DATA: Lab Results  Component Value Date   WBC 4.8 12/09/2015   HGB 12.6 12/09/2015   HCT 37.4 12/09/2015   MCV 98.9 12/09/2015   PLT 184 12/09/2015      Chemistry      Component Value Date/Time   NA 141 11/04/2015 1126   NA 137 11/14/2012 0816   NA 143 07/11/2012 0927   K 4.6 11/04/2015 1126   K 3.9 11/14/2012 0816   K 4.5 07/11/2012 0927   CL 102 05/08/2013 0851   CL 101 11/14/2012 0816   CL 101 07/11/2012 0927   CO2 23 11/04/2015 1126   CO2 28 11/14/2012 0816   CO2 29 07/11/2012 0927   BUN 23.4 11/04/2015 1126   BUN 17 11/14/2012 0816   BUN 16 07/11/2012 0927   CREATININE 1.5* 11/04/2015 1126   CREATININE 1.52* 11/14/2012 0816   CREATININE 1.5* 07/11/2012 0927      Component Value Date/Time   CALCIUM 9.8 11/04/2015 1126   CALCIUM 10.2 11/14/2012 0816   CALCIUM 10.0 07/11/2012 0927   ALKPHOS 69 11/04/2015 1126   ALKPHOS 74  11/14/2012 0816   ALKPHOS 71 07/11/2012 0927   AST 18 11/04/2015 1126   AST 16 11/14/2012 0816   AST 19 07/11/2012 0927   ALT 9 11/04/2015 1126   ALT 8 11/14/2012 0816   ALT 18 07/11/2012 0927   BILITOT 1.87* 11/04/2015 1126   BILITOT 1.2 11/14/2012 0816   BILITOT 0.80 07/11/2012 0927       RADIOGRAPHIC STUDIES: Ct Abdomen Pelvis Wo Contrast  11/11/2015  CLINICAL DATA:  Right lung cancer diagnosed 2008 currently on tarsi. Remote history of parotid gland adenocarcinoma. EXAM: CT CHEST, ABDOMEN AND PELVIS WITHOUT CONTRAST TECHNIQUE: Multidetector CT imaging of the chest, abdomen and pelvis was performed following the standard protocol without IV contrast. COMPARISON:  08/03/2015 FINDINGS: CT CHEST FINDINGS Mediastinum/Nodes: Small left lower internal jugular nodes near the thoracic inlet, largest 5 mm in short axis. Similar appearance to prior. Coronary, aortic arch, and branch vessel atherosclerotic vascular disease. Mild cardiomegaly. Volume loss in the right hemithorax with shift of mediastinal structures to the right. Lungs/Pleura: Chronic therapy related findings in the right lung includes central density  along the right hilum surrounding the central right tracheobronchial tree, truncation of some of the right lower lobe tracheobronchial tree in this region of density, chronic loculated complex right pleural effusion with internal calcifications, and volume loss. There is some scattered sub solid densities in the remaining aerated right lung which are stable from 11/12/2013. In general these post therapy findings are stable from 2014. Musculoskeletal: Thoracic spondylosis and degenerative disc disease with multilevel degenerative endplate sclerosis and multilevel Schmorl's nodes. CT ABDOMEN PELVIS FINDINGS Hepatobiliary: Unremarkable Pancreas: Unremarkable Spleen: Unremarkable Adrenals/Urinary Tract: Unremarkable Stomach/Bowel: Unremarkable Vascular/Lymphatic: Aortoiliac atherosclerotic vascular  disease. Small peripancreatic lymph nodes are stable and not pathologically enlarged. Reproductive: Unremarkable Other: No supplemental non-categorized findings. Musculoskeletal: Mild lumbar spondylosis and degenerative disc disease. IMPRESSION: 1. Stable appearance of therapy related findings in the right hemithorax, without progression over the last several years to suggest recurrent malignancy. No findings of distant metastatic disease. 2. Coronary, aortic arch, and branch vessel atherosclerotic vascular disease. Aortoiliac atherosclerotic vascular disease. Mild cardiomegaly. 3. Thoracolumbar spondylosis and degenerative disc disease. Electronically Signed   By: Van Clines M.D.   On: 11/11/2015 11:14   Ct Chest Wo Contrast  11/11/2015  CLINICAL DATA:  Right lung cancer diagnosed 2008 currently on tarsi. Remote history of parotid gland adenocarcinoma. EXAM: CT CHEST, ABDOMEN AND PELVIS WITHOUT CONTRAST TECHNIQUE: Multidetector CT imaging of the chest, abdomen and pelvis was performed following the standard protocol without IV contrast. COMPARISON:  08/03/2015 FINDINGS: CT CHEST FINDINGS Mediastinum/Nodes: Small left lower internal jugular nodes near the thoracic inlet, largest 5 mm in short axis. Similar appearance to prior. Coronary, aortic arch, and branch vessel atherosclerotic vascular disease. Mild cardiomegaly. Volume loss in the right hemithorax with shift of mediastinal structures to the right. Lungs/Pleura: Chronic therapy related findings in the right lung includes central density along the right hilum surrounding the central right tracheobronchial tree, truncation of some of the right lower lobe tracheobronchial tree in this region of density, chronic loculated complex right pleural effusion with internal calcifications, and volume loss. There is some scattered sub solid densities in the remaining aerated right lung which are stable from 11/12/2013. In general these post therapy findings are  stable from 2014. Musculoskeletal: Thoracic spondylosis and degenerative disc disease with multilevel degenerative endplate sclerosis and multilevel Schmorl's nodes. CT ABDOMEN PELVIS FINDINGS Hepatobiliary: Unremarkable Pancreas: Unremarkable Spleen: Unremarkable Adrenals/Urinary Tract: Unremarkable Stomach/Bowel: Unremarkable Vascular/Lymphatic: Aortoiliac atherosclerotic vascular disease. Small peripancreatic lymph nodes are stable and not pathologically enlarged. Reproductive: Unremarkable Other: No supplemental non-categorized findings. Musculoskeletal: Mild lumbar spondylosis and degenerative disc disease. IMPRESSION: 1. Stable appearance of therapy related findings in the right hemithorax, without progression over the last several years to suggest recurrent malignancy. No findings of distant metastatic disease. 2. Coronary, aortic arch, and branch vessel atherosclerotic vascular disease. Aortoiliac atherosclerotic vascular disease. Mild cardiomegaly. 3. Thoracolumbar spondylosis and degenerative disc disease. Electronically Signed   By: Van Clines M.D.   On: 11/11/2015 11:14    ASSESSMENT AND PLAN: This is a very pleasant 75 years old white female with metastatic non-small cell lung cancer currently on treatment with Tarceva 150 mg by mouth daily status post 37 months of treatment and tolerating it fairly well except for dry skin and grade 1-2 skin rash on the face and fingers as well as mild paronychia.  I recommended for the patient to continue her treatment with Tarceva with the same dose but we will hold her treatment for 1 week until improvement of her skin rash. For the paronychia  of the fingernails, I will start the patient on doxycycline 100 mg by mouth twice a day for 7 days She would come back for followup visit in one month for reevaluation with repeat CBC and comprehensive metabolic panel. She will continue to have Port-A-Cath flush every 8 weeks. She was advised to call  immediately if she has any concerning symptoms in the interval.  The patient voices understanding of current disease status and treatment options and is in agreement with the current care plan.  All questions were answered. The patient knows to call the clinic with any problems, questions or concerns. We can certainly see the patient much sooner if necessary.  Disclaimer: This note was dictated with voice recognition software. Similar sounding words can inadvertently be transcribed and may not be corrected upon review.

## 2016-01-04 ENCOUNTER — Encounter: Payer: Self-pay | Admitting: Internal Medicine

## 2016-01-04 NOTE — Progress Notes (Signed)
Per [facility] tarceva was shipped via [employer] 01/03/16

## 2016-01-06 ENCOUNTER — Other Ambulatory Visit (HOSPITAL_BASED_OUTPATIENT_CLINIC_OR_DEPARTMENT_OTHER): Payer: Medicare Other

## 2016-01-06 ENCOUNTER — Telehealth: Payer: Self-pay | Admitting: Internal Medicine

## 2016-01-06 ENCOUNTER — Ambulatory Visit (HOSPITAL_BASED_OUTPATIENT_CLINIC_OR_DEPARTMENT_OTHER): Payer: Medicare Other

## 2016-01-06 ENCOUNTER — Ambulatory Visit (HOSPITAL_BASED_OUTPATIENT_CLINIC_OR_DEPARTMENT_OTHER): Payer: Medicare Other | Admitting: Internal Medicine

## 2016-01-06 ENCOUNTER — Encounter: Payer: Self-pay | Admitting: Internal Medicine

## 2016-01-06 VITALS — BP 119/55 | HR 88 | Temp 97.8°F | Resp 18 | Ht 66.0 in | Wt 242.4 lb

## 2016-01-06 DIAGNOSIS — C3491 Malignant neoplasm of unspecified part of right bronchus or lung: Secondary | ICD-10-CM

## 2016-01-06 DIAGNOSIS — C3490 Malignant neoplasm of unspecified part of unspecified bronchus or lung: Secondary | ICD-10-CM | POA: Diagnosis not present

## 2016-01-06 DIAGNOSIS — Z95828 Presence of other vascular implants and grafts: Secondary | ICD-10-CM

## 2016-01-06 DIAGNOSIS — R21 Rash and other nonspecific skin eruption: Secondary | ICD-10-CM | POA: Diagnosis not present

## 2016-01-06 DIAGNOSIS — Z5111 Encounter for antineoplastic chemotherapy: Secondary | ICD-10-CM

## 2016-01-06 LAB — CBC WITH DIFFERENTIAL/PLATELET
BASO%: 0.6 % (ref 0.0–2.0)
Basophils Absolute: 0 10*3/uL (ref 0.0–0.1)
EOS%: 5.8 % (ref 0.0–7.0)
Eosinophils Absolute: 0.3 10*3/uL (ref 0.0–0.5)
HEMATOCRIT: 37.5 % (ref 34.8–46.6)
HGB: 12.4 g/dL (ref 11.6–15.9)
LYMPH#: 0.7 10*3/uL — AB (ref 0.9–3.3)
LYMPH%: 14.9 % (ref 14.0–49.7)
MCH: 33.4 pg (ref 25.1–34.0)
MCHC: 33.1 g/dL (ref 31.5–36.0)
MCV: 101.1 fL — ABNORMAL HIGH (ref 79.5–101.0)
MONO#: 0.4 10*3/uL (ref 0.1–0.9)
MONO%: 8.7 % (ref 0.0–14.0)
NEUT%: 70 % (ref 38.4–76.8)
NEUTROS ABS: 3.5 10*3/uL (ref 1.5–6.5)
PLATELETS: 167 10*3/uL (ref 145–400)
RBC: 3.71 10*6/uL (ref 3.70–5.45)
RDW: 13 % (ref 11.2–14.5)
WBC: 5 10*3/uL (ref 3.9–10.3)

## 2016-01-06 LAB — COMPREHENSIVE METABOLIC PANEL
ALT: 11 U/L (ref 0–55)
ANION GAP: 8 meq/L (ref 3–11)
AST: 15 U/L (ref 5–34)
Albumin: 3 g/dL — ABNORMAL LOW (ref 3.5–5.0)
Alkaline Phosphatase: 62 U/L (ref 40–150)
BILIRUBIN TOTAL: 1.33 mg/dL — AB (ref 0.20–1.20)
BUN: 22.7 mg/dL (ref 7.0–26.0)
CHLORIDE: 105 meq/L (ref 98–109)
CO2: 26 meq/L (ref 22–29)
CREATININE: 1.6 mg/dL — AB (ref 0.6–1.1)
Calcium: 9.7 mg/dL (ref 8.4–10.4)
EGFR: 30 mL/min/{1.73_m2} — ABNORMAL LOW (ref >90)
Glucose: 87 mg/dl (ref 70–140)
Potassium: 4.3 mEq/L (ref 3.5–5.1)
Sodium: 139 mEq/L (ref 136–145)
TOTAL PROTEIN: 5.8 g/dL — AB (ref 6.4–8.3)

## 2016-01-06 MED ORDER — HEPARIN SOD (PORK) LOCK FLUSH 100 UNIT/ML IV SOLN
500.0000 [IU] | Freq: Once | INTRAVENOUS | Status: AC
  Administered 2016-01-06: 500 [IU] via INTRAVENOUS
  Filled 2016-01-06: qty 5

## 2016-01-06 MED ORDER — SODIUM CHLORIDE 0.9% FLUSH
10.0000 mL | INTRAVENOUS | Status: DC | PRN
  Administered 2016-01-06: 10 mL via INTRAVENOUS
  Filled 2016-01-06: qty 10

## 2016-01-06 NOTE — Progress Notes (Signed)
[facility] [phone]   Fax:(336) [phone]  OFFICE PROGRESS NOTE  [name], MD [address] [place] [address]  DIAGNOSIS: Recurrent non-small cell lung cancer, adenocarcinoma initially diagnosed as stage IIA in March 2008. The tumor cells were negative for EGFR mutation as well as ALK gene translocation.   PRIOR THERAPY:  1. Status post concurrent chemoradiation with weekly carboplatin and paclitaxel. Last dose was given Apr 12, 2007. 2. Status post 3 cycles of consolidation chemotherapy with docetaxel. Last dose was given July 12, 2007. 3. 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 4. Status post 39 cycles of maintenance Alimta at a dose of 500 mg/m2. Last dose was given February 25, 2010, discontinued secondary to persistent anemia, but the patient has stable disease. 5. Tarceva 150 mg by mouth daily. Status post approximately 37 months of therapy.    CURRENT THERAPY: Tarceva 100 mg by mouth daily. Status post 1 month of therapy.   CHEMOTHERAPY INTENT: Palliative  CURRENT # OF CHEMOTHERAPY CYCLES: 39 CURRENT ANTIEMETICS: Compazine when necessary  CURRENT SMOKING STATUS: Former smoker quit 11/27/2001  ORAL CHEMOTHERAPY AND CONSENT: Tarceva  CURRENT BISPHOSPHONATES USE: None  PAIN MANAGEMENT: 0/10  NARCOTICS INDUCED CONSTIPATION: None  LIVING WILL AND CODE STATUS: Full code initially but no prolonged resuscitation.   INTERVAL HISTORY: Diane [name] 75 y.o. female returns to the clinic today for routine monthly followup visit. She is tolerating her treatment with Tarceva 100 mg by mouth daily much better. She continues to have mild skin rash. No significant episodes of diarrhea. The patient is feeling fine today with no other complaints. She has no significant fever or chills no nausea or vomiting. She has shortness of breath with exertion with no cough or  hemoptysis. She denied having any significant weight loss or night sweats. She has no nausea or vomiting. She had repeat CBC and comprehensive metabolic panel performed earlier today and she is here for evaluation and discussion of her lab results.  MEDICAL HISTORY: Past Medical History  Diagnosis Date  . Hypertension   . Renal insufficiency     stage 3 renal disease/ pt  . lung ca dx'd 02/2010  . 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  MEDICATIONS:  Current Outpatient Prescriptions  Medication Sig Dispense Refill  . aspirin 81 MG tablet Take 81 mg by mouth daily.      . calcitRIOL (ROCALTROL) 0.25 MCG capsule Take 0.25 mcg by mouth every Monday, Wednesday, and Friday.    . erlotinib (TARCEVA) 100 MG tablet Take 1 tablet (100 mg total) by mouth daily. Take on an empty stomach 1 hour before meals or 2 hours after. 30 tablet 2  . labetalol (NORMODYNE) 200 MG tablet Take 200 mg by mouth 2 (two) times daily. Take 1 1/2 pills two times daily    . methylPREDNISolone (MEDROL DOSEPAK) 4 MG TBPK tablet     . neomycin-polymyxin b-dexamethasone (MAXITROL) 3.5-10000-0.1 SUSP Place 1 drop into both eyes.     . polyethylene glycol (MIRALAX / GLYCOLAX) packet Take 17 g by mouth daily.    Marland Kitchen torsemide (DEMADEX) 20 MG tablet Take 20 mg by mouth daily.       No current facility-administered medications for this visit.   Facility-Administered Medications Ordered in Other Visits  Medication Dose Route Frequency Provider Last Rate Last Dose  . sodium  chloride 0.9 % injection 10 mL  10 mL Intravenous PRN Si Gaul, MD   10 mL at 10/14/13 1030    SURGICAL HISTORY:  Past Surgical History  Procedure Laterality Date  . Portacath placement  11/03/08-[facility]    tip in mid SVC-E. Mansell    REVIEW OF SYSTEMS:  A comprehensive review of systems was negative except for: Integument/breast: positive for dryness and rash Paronychia of the fingernails    PHYSICAL EXAMINATION: General appearance: alert, cooperative and no distress Head: Normocephalic, without obvious abnormality, atraumatic Neck: no adenopathy, no carotid bruit, no JVD, supple, symmetrical, trachea midline and thyroid not enlarged, symmetric, no tenderness/mass/nodules Lymph nodes: Cervical, supraclavicular, and axillary nodes normal. Resp: clear to auscultation bilaterally and normal percussion bilaterally Back: symmetric, no curvature. ROM normal. No CVA tenderness. Cardio: regular rate and rhythm, S1, S2 normal, no murmur, click, rub or gallop GI: soft, non-tender; bowel sounds normal; no masses,  no organomegaly Extremities: extremities normal, atraumatic, no cyanosis or edema Neurologic: Alert and oriented X 3, normal strength and tone. Normal symmetric reflexes. Normal coordination and gait  ECOG PERFORMANCE STATUS: 1 - Symptomatic but completely ambulatory  Blood pressure 119/55, pulse 88, temperature 97.8 F (36.6 C), temperature source Oral, resp. rate 18, height 5\' 6"  (1.676 m), weight 242 lb 6.4 oz (109.952 kg), SpO2 96 %.  LABORATORY DATA: Lab Results  Component Value Date   WBC 5.0 01/06/2016   HGB 12.4 01/06/2016   HCT 37.5 01/06/2016   MCV 101.1* 01/06/2016   PLT 167 01/06/2016      Chemistry      Component Value Date/Time   NA 139 01/06/2016 0753   NA 137 11/14/2012 0816   NA 143 07/11/2012 0927   K 4.3 01/06/2016 0753   K 3.9 11/14/2012 0816   K 4.5 07/11/2012 0927   CL 102 05/08/2013 0851   CL 101 11/14/2012 0816   CL 101 07/11/2012 0927   CO2 26 01/06/2016 0753   CO2 28 11/14/2012 0816   CO2 29 07/11/2012 0927   BUN 22.7 01/06/2016 0753   BUN 17 11/14/2012 0816   BUN 16 07/11/2012 0927   CREATININE 1.6* 01/06/2016 0753   CREATININE 1.52* 11/14/2012 0816   CREATININE 1.5* 07/11/2012 0927      Component Value Date/Time   CALCIUM 9.7 01/06/2016 0753   CALCIUM 10.2 11/14/2012 0816   CALCIUM 10.0 07/11/2012 0927   ALKPHOS 62  01/06/2016 0753   ALKPHOS 74 11/14/2012 0816   ALKPHOS 71 07/11/2012 0927   AST 15 01/06/2016 0753   AST 16 11/14/2012 0816   AST 19 07/11/2012 0927   ALT 11 01/06/2016 0753   ALT 8 11/14/2012 0816   ALT 18 07/11/2012 0927   BILITOT 1.33* 01/06/2016 0753   BILITOT 1.2 11/14/2012 0816   BILITOT 0.80 07/11/2012 0927       RADIOGRAPHIC STUDIES: No results found.  ASSESSMENT AND PLAN: This is a very pleasant 75 years old white female with metastatic non-small cell lung cancer currently on treatment with Tarceva 150 mg by mouth daily status post 37 months of treatment and tolerating it fairly well except for dry skin and grade 1-2 skin rash on the face and fingers as well as mild paronychia.  She is currently on treatment with Tarceva 100 mg by mouth daily and tolerating it much better. I recommended for the patient to continue her treatment as scheduled. I would see her back for follow-up visit in one month's for reevaluation after repeating CT  scan of the chest without contrast. She will continue to have Port-A-Cath flush every 8 weeks. She was advised to call immediately if she has any concerning symptoms in the interval.  The patient voices understanding of current disease status and treatment options and is in agreement with the current care plan.  All questions were answered. The patient knows to call the clinic with any problems, questions or concerns. We can certainly see the patient much sooner if necessary.  Disclaimer: This note was dictated with voice recognition software. Similar sounding words can inadvertently be transcribed and may not be corrected upon review.

## 2016-01-06 NOTE — Telephone Encounter (Signed)
per [facility] to sch pt appt-gave pt copy of avs-adv [facility] all to sch scan

## 2016-01-06 NOTE — Patient Instructions (Signed)

## 2016-01-28 ENCOUNTER — Other Ambulatory Visit: Payer: Medicare Other

## 2016-01-28 ENCOUNTER — Ambulatory Visit (HOSPITAL_COMMUNITY)
Admission: RE | Admit: 2016-01-28 | Discharge: 2016-01-28 | Disposition: A | Discharge status: Home / Self Care | Payer: Medicare Other | Source: Ambulatory Visit | Attending: Internal Medicine | Admitting: Internal Medicine

## 2016-01-28 DIAGNOSIS — J701 Chronic and other pulmonary manifestations due to radiation: Secondary | ICD-10-CM | POA: Insufficient documentation

## 2016-01-28 DIAGNOSIS — R911 Solitary pulmonary nodule: Secondary | ICD-10-CM | POA: Diagnosis not present

## 2016-01-28 DIAGNOSIS — E042 Nontoxic multinodular goiter: Secondary | ICD-10-CM | POA: Diagnosis not present

## 2016-01-28 DIAGNOSIS — J439 Emphysema, unspecified: Secondary | ICD-10-CM | POA: Insufficient documentation

## 2016-01-28 DIAGNOSIS — Z5111 Encounter for antineoplastic chemotherapy: Secondary | ICD-10-CM | POA: Diagnosis not present

## 2016-01-28 DIAGNOSIS — C3491 Malignant neoplasm of unspecified part of right bronchus or lung: Secondary | ICD-10-CM | POA: Diagnosis present

## 2016-01-28 DIAGNOSIS — J90 Pleural effusion, not elsewhere classified: Secondary | ICD-10-CM | POA: Insufficient documentation

## 2016-01-28 DIAGNOSIS — I2510 Atherosclerotic heart disease of native coronary artery without angina pectoris: Secondary | ICD-10-CM | POA: Insufficient documentation

## 2016-01-28 DIAGNOSIS — C3490 Malignant neoplasm of unspecified part of unspecified bronchus or lung: Secondary | ICD-10-CM | POA: Diagnosis not present

## 2016-01-28 LAB — CBC WITH DIFFERENTIAL/PLATELET
BASO%: 1.1 % (ref 0.0–2.0)
Basophils Absolute: 0 10*3/uL (ref 0.0–0.1)
EOS%: 6.6 % (ref 0.0–7.0)
Eosinophils Absolute: 0.3 10*3/uL (ref 0.0–0.5)
HEMATOCRIT: 38.4 % (ref 34.8–46.6)
HGB: 12.6 g/dL (ref 11.6–15.9)
LYMPH#: 0.5 10*3/uL — AB (ref 0.9–3.3)
LYMPH%: 12.4 % — ABNORMAL LOW (ref 14.0–49.7)
MCH: 32.5 pg (ref 25.1–34.0)
MCHC: 32.7 g/dL (ref 31.5–36.0)
MCV: 99.4 fL (ref 79.5–101.0)
MONO#: 0.4 10*3/uL (ref 0.1–0.9)
MONO%: 8.7 % (ref 0.0–14.0)
NEUT%: 71.2 % (ref 38.4–76.8)
NEUTROS ABS: 3.1 10*3/uL (ref 1.5–6.5)
PLATELETS: 173 10*3/uL (ref 145–400)
RBC: 3.87 10*6/uL (ref 3.70–5.45)
RDW: 13.4 % (ref 11.2–14.5)
WBC: 4.4 10*3/uL (ref 3.9–10.3)

## 2016-01-28 LAB — COMPREHENSIVE METABOLIC PANEL
ALT: 11 U/L (ref 0–55)
ANION GAP: 9 meq/L (ref 3–11)
AST: 15 U/L (ref 5–34)
Albumin: 3.1 g/dL — ABNORMAL LOW (ref 3.5–5.0)
Alkaline Phosphatase: 66 U/L (ref 40–150)
BILIRUBIN TOTAL: 1.27 mg/dL — AB (ref 0.20–1.20)
BUN: 23.1 mg/dL (ref 7.0–26.0)
CALCIUM: 9.8 mg/dL (ref 8.4–10.4)
CO2: 27 meq/L (ref 22–29)
CREATININE: 1.6 mg/dL — AB (ref 0.6–1.1)
Chloride: 103 mEq/L (ref 98–109)
EGFR: 31 mL/min/{1.73_m2} — ABNORMAL LOW (ref >90)
Glucose: 84 mg/dl (ref 70–140)
Potassium: 4.5 mEq/L (ref 3.5–5.1)
Sodium: 140 mEq/L (ref 136–145)
TOTAL PROTEIN: 6.2 g/dL — AB (ref 6.4–8.3)

## 2016-01-31 ENCOUNTER — Encounter: Payer: Self-pay | Admitting: Internal Medicine

## 2016-01-31 ENCOUNTER — Ambulatory Visit (HOSPITAL_BASED_OUTPATIENT_CLINIC_OR_DEPARTMENT_OTHER): Payer: Medicare Other | Admitting: Internal Medicine

## 2016-01-31 ENCOUNTER — Telehealth: Payer: Self-pay | Admitting: Internal Medicine

## 2016-01-31 VITALS — BP 155/63 | HR 90 | Temp 97.9°F | Resp 18 | Ht 66.0 in | Wt 245.8 lb

## 2016-01-31 DIAGNOSIS — C3491 Malignant neoplasm of unspecified part of right bronchus or lung: Secondary | ICD-10-CM

## 2016-01-31 DIAGNOSIS — Z5111 Encounter for antineoplastic chemotherapy: Secondary | ICD-10-CM

## 2016-01-31 NOTE — Progress Notes (Signed)
[facility] [phone]   Fax:([phone]  OFFICE PROGRESS NOTE  [name], MD [address] [place] [address]  DIAGNOSIS: Recurrent non-small cell lung cancer, adenocarcinoma initially diagnosed as stage IIA in March 2008. The tumor cells were negative for EGFR mutation as well as ALK gene translocation.   PRIOR THERAPY:  1. Status post concurrent chemoradiation with weekly carboplatin and paclitaxel. Last dose was given Apr 12, 2007. 2. Status post 3 cycles of consolidation chemotherapy with docetaxel. Last dose was given July 12, 2007. 3. 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 4. Status post 39 cycles of maintenance Alimta at a dose of 500 mg/m2. Last dose was given February 25, 2010, discontinued secondary to persistent anemia, but the patient has stable disease. 5. Tarceva 150 mg by mouth daily. Status post approximately 37 months of therapy.    CURRENT THERAPY: Tarceva 100 mg by mouth daily. Status post 2 month of therapy.   CHEMOTHERAPY INTENT: Palliative  CURRENT # OF CHEMOTHERAPY CYCLES: 40 CURRENT ANTIEMETICS: Compazine when necessary  CURRENT SMOKING STATUS: Former smoker quit 11/27/2001  ORAL CHEMOTHERAPY AND CONSENT: Tarceva  CURRENT BISPHOSPHONATES USE: None  PAIN MANAGEMENT: 0/10  NARCOTICS INDUCED CONSTIPATION: None  LIVING WILL AND CODE STATUS: Full code initially but no prolonged resuscitation.   INTERVAL HISTORY: Diane [name] 75 y.o. female returns to the clinic today for routine monthly followup visit. The patient is feeling fine today with no specific complaints. She is tolerating her treatment with Tarceva 100 mg by mouth daily much better. She continues to have mild skin rash on the lower extremities. No significant diarrhea.  She has no significant fever or chills no nausea or vomiting. She has shortness of breath with exertion with no  cough or hemoptysis. She denied having any significant weight loss or night sweats. She has no nausea or vomiting. She had repeat CT scan of the chest performed recently and she is here for evaluation and discussion of her scan results.  MEDICAL HISTORY: Past Medical History  Diagnosis Date  . Hypertension   . Renal insufficiency     stage 3 renal disease/ pt  . lung ca dx'd 02/2010  . 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  MEDICATIONS:  Current Outpatient Prescriptions  Medication Sig Dispense Refill  . aspirin 81 MG tablet Take 81 mg by mouth daily.      . calcitRIOL (ROCALTROL) 0.25 MCG capsule Take 0.25 mcg by mouth every Monday, Wednesday, and Friday.    . erlotinib (TARCEVA) 100 MG tablet Take 1 tablet (100 mg total) by mouth daily. Take on an empty stomach 1 hour before meals or 2 hours after. 30 tablet 2  . labetalol (NORMODYNE) 200 MG tablet Take 200 mg by mouth 2 (two) times daily. Take 1 1/2 pills two times daily    . methylPREDNISolone (MEDROL DOSEPAK) 4 MG TBPK tablet     . neomycin-polymyxin b-dexamethasone (MAXITROL) 3.5-10000-0.1 SUSP Place 1 drop into both eyes.     . polyethylene glycol (MIRALAX / GLYCOLAX) packet Take 17 g by mouth daily.    Marland Kitchen torsemide (DEMADEX) 20 MG tablet Take 20 mg by mouth daily.       No current facility-administered medications for this visit.   Facility-Administered Medications Ordered in Other Visits  Medication Dose Route Frequency Provider Last Rate Last Dose  .  sodium chloride 0.9 % injection 10 mL  10 mL Intravenous PRN Curt Bears, MD   10 mL at 10/14/13 1030    SURGICAL HISTORY:  Past Surgical History  Procedure Laterality Date  . Portacath placement  11/03/08-[facility]    tip in mid SVC-E. Mansell    REVIEW OF SYSTEMS:  Constitutional: negative Eyes: negative Ears, nose, mouth, throat, and face: negative Respiratory: negative Cardiovascular:  negative Gastrointestinal: negative Genitourinary:negative Integument/breast: negative Hematologic/lymphatic: negative Musculoskeletal:negative Neurological: negative Behavioral/Psych: negative Endocrine: negative Allergic/Immunologic: negative   PHYSICAL EXAMINATION: General appearance: alert, cooperative and no distress Head: Normocephalic, without obvious abnormality, atraumatic Neck: no adenopathy, no carotid bruit, no JVD, supple, symmetrical, trachea midline and thyroid not enlarged, symmetric, no tenderness/mass/nodules Lymph nodes: Cervical, supraclavicular, and axillary nodes normal. Resp: clear to auscultation bilaterally and normal percussion bilaterally Back: symmetric, no curvature. ROM normal. No CVA tenderness. Cardio: regular rate and rhythm, S1, S2 normal, no murmur, click, rub or gallop GI: soft, non-tender; bowel sounds normal; no masses,  no organomegaly Extremities: extremities normal, atraumatic, no cyanosis or edema Neurologic: Alert and oriented X 3, normal strength and tone. Normal symmetric reflexes. Normal coordination and gait  ECOG PERFORMANCE STATUS: 1 - Symptomatic but completely ambulatory  Blood pressure 155/63, pulse 90, temperature 97.9 F (36.6 C), temperature source Oral, resp. rate 18, height '5\' 6"'$  (1.676 m), weight 245 lb 12.8 oz (111.494 kg), SpO2 94 %.  LABORATORY DATA: Lab Results  Component Value Date   WBC 4.4 01/28/2016   HGB 12.6 01/28/2016   HCT 38.4 01/28/2016   MCV 99.4 01/28/2016   PLT 173 01/28/2016      Chemistry      Component Value Date/Time   NA 140 01/28/2016 0744   NA 137 11/14/2012 0816   NA 143 07/11/2012 0927   K 4.5 01/28/2016 0744   K 3.9 11/14/2012 0816   K 4.5 07/11/2012 0927   CL 102 05/08/2013 0851   CL 101 11/14/2012 0816   CL 101 07/11/2012 0927   CO2 27 01/28/2016 0744   CO2 28 11/14/2012 0816   CO2 29 07/11/2012 0927   BUN 23.1 01/28/2016 0744   BUN 17 11/14/2012 0816   BUN 16 07/11/2012 0927    CREATININE 1.6* 01/28/2016 0744   CREATININE 1.52* 11/14/2012 0816   CREATININE 1.5* 07/11/2012 0927      Component Value Date/Time   CALCIUM 9.8 01/28/2016 0744   CALCIUM 10.2 11/14/2012 0816   CALCIUM 10.0 07/11/2012 0927   ALKPHOS 66 01/28/2016 0744   ALKPHOS 74 11/14/2012 0816   ALKPHOS 71 07/11/2012 0927   AST 15 01/28/2016 0744   AST 16 11/14/2012 0816   AST 19 07/11/2012 0927   ALT 11 01/28/2016 0744   ALT 8 11/14/2012 0816   ALT 18 07/11/2012 0927   BILITOT 1.27* 01/28/2016 0744   BILITOT 1.2 11/14/2012 0816   BILITOT 0.80 07/11/2012 0927       RADIOGRAPHIC STUDIES: Ct Chest Wo Contrast  01/28/2016  CLINICAL DATA:  Right lower lobe lung adenocarcinoma diagnosed in 2008 status post concurrent chemoradiation with ongoing oral chemotherapy presenting for restaging. EXAM: CT CHEST WITHOUT CONTRAST TECHNIQUE: Multidetector CT imaging of the chest was performed following the standard protocol without IV contrast. COMPARISON:  11/11/2015 chest CT. FINDINGS: Mediastinum/Nodes: Stable top-normal heart size. No significant pericardial fluid/ thickening. Coronary atherosclerosis. Left internal jugular MediPort terminates in the middle third of the superior vena cava. Great vessels are normal in course and caliber. Stable small multinodular goiter with  dominant calcified 1.8 cm thyroid isthmus nodule. Normal esophagus. No pathologically enlarged axillary, mediastinal or gross hilar lymph nodes, noting limited sensitivity for the detection of hilar adenopathy on this noncontrast study. Lungs/Pleura: No pneumothorax. No left pleural effusion. There is a stable moderate posterior right subpulmonic loculated pleural effusion measuring 13.1 x 7.0 cm (series 2/image 44), which again demonstrates circumferential pleural thickening and internal coarse calcifications and heterogeneous density. There is a 1.5 x 1.0 cm subsolid anterior right upper lobe pulmonary nodule (series 5/image 25), which  measured 1.5 x 1.0 cm on 11/11/2015, unchanged in the interval, although increased from 1.0 x 0.8 cm on 11/12/2013. There is a subsolid 1.6 x 0.9 cm apical right upper lobe pulmonary nodule (series 5/ image 16) there previously 1.4 x 0.9 cm, which appears slightly increased in size and density. 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 A subsolid 0.8 x 0.7 cm posterior right upper lobe pulmonary nodule (series 5/ image 26) is unchanged back to 11/12/2013. A left upper lobe 0.5 cm ground-glass pulmonary nodule (series 5/image 21) is unchanged back to 11/12/2013. Mild centrilobular emphysema and diffuse bronchial wall thickening. No acute consolidative airspace disease or new significant pulmonary nodules. Upper abdomen: Unremarkable. Musculoskeletal: No aggressive appearing focal osseous lesions. Moderate to severe degenerative changes in the thoracic spine. IMPRESSION: 1. 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 No evidence of local tumor recurrence. No thoracic adenopathy. 2. Slight growth and increased density within a subsolid 1.6 cm apical right upper lobe pulmonary nodule, adenocarcinoma not excluded. 3. Interval stability of separate subsolid 1.5 cm right upper lobe pulmonary nodule, which has mildly increased in size compared to more distant imaging studies from 2014, cannot exclude indolent adenocarcinoma. 4. Stable chronic loculated right subpulmonic pleural effusion. 5. Additional findings include coronary atherosclerosis, multinodular goiter and mild emphysema. Electronically Signed   By: Ilona Sorrel M.D.   On: 01/28/2016 09:27    ASSESSMENT AND PLAN: This is a very pleasant 75 years old white female with metastatic non-small cell lung cancer currently on treatment with Tarceva 150 mg by mouth daily status post 37 months of treatment and tolerating it fairly well except for dry  skin and grade 1-2 skin rash on the face and fingers as well as mild paronychia.  She is currently on treatment with Tarceva 100 mg by mouth daily status post 2 months and tolerating it much better. The recent CT scan of the chest showed no evidence for disease progression. I discussed the scan results with the patient today. I recommended for the patient to continue her treatment with Tarceva 100 mg by mouth daily as scheduled. I would see her back for follow-up visit in one month for reevaluation with repeat blood work She will continue to have Port-A-Cath flush every 8 weeks. She was advised to call immediately if she has any concerning symptoms in the interval.  The patient voices understanding of current disease status and treatment options and is in agreement with the current care plan.  All questions were answered. The patient knows to call the clinic with any problems, questions or concerns. We can certainly see the patient much sooner if necessary.  Disclaimer: This note was dictated with voice recognition software. Similar sounding words can inadvertently be transcribed and may not be corrected upon review.

## 2016-01-31 NOTE — Telephone Encounter (Signed)
Gave and printed appt sched and avs for pt for April °

## 2016-02-01 ENCOUNTER — Other Ambulatory Visit: Payer: Medicare Other

## 2016-02-08 ENCOUNTER — Ambulatory Visit: Payer: Medicare Other | Admitting: Internal Medicine

## 2016-02-09 ENCOUNTER — Encounter: Payer: Self-pay | Admitting: Internal Medicine

## 2016-02-09 NOTE — Progress Notes (Signed)
Per [employer] tarceva shipped 02/08/16 via [employer]

## 2016-02-10 ENCOUNTER — Encounter: Payer: Self-pay | Admitting: Internal Medicine

## 2016-02-10 NOTE — Progress Notes (Signed)
Per [employer] tarceva was shipped via [employer] 02/08/16

## 2016-03-02 ENCOUNTER — Ambulatory Visit (HOSPITAL_BASED_OUTPATIENT_CLINIC_OR_DEPARTMENT_OTHER): Payer: Medicare Other

## 2016-03-02 ENCOUNTER — Ambulatory Visit (HOSPITAL_BASED_OUTPATIENT_CLINIC_OR_DEPARTMENT_OTHER): Payer: Medicare Other | Admitting: Internal Medicine

## 2016-03-02 ENCOUNTER — Encounter: Payer: Self-pay | Admitting: Internal Medicine

## 2016-03-02 ENCOUNTER — Other Ambulatory Visit (HOSPITAL_BASED_OUTPATIENT_CLINIC_OR_DEPARTMENT_OTHER): Payer: Medicare Other

## 2016-03-02 VITALS — BP 151/58 | HR 87 | Temp 97.8°F | Resp 18 | Ht 66.0 in | Wt 244.0 lb

## 2016-03-02 DIAGNOSIS — R21 Rash and other nonspecific skin eruption: Secondary | ICD-10-CM | POA: Diagnosis not present

## 2016-03-02 DIAGNOSIS — Z95828 Presence of other vascular implants and grafts: Secondary | ICD-10-CM

## 2016-03-02 DIAGNOSIS — C3491 Malignant neoplasm of unspecified part of right bronchus or lung: Secondary | ICD-10-CM | POA: Diagnosis not present

## 2016-03-02 DIAGNOSIS — Z5111 Encounter for antineoplastic chemotherapy: Secondary | ICD-10-CM

## 2016-03-02 LAB — COMPREHENSIVE METABOLIC PANEL
ALK PHOS: 61 U/L (ref 40–150)
ALT: <9 U/L (ref 0–55)
AST: 13 U/L (ref 5–34)
Albumin: 2.9 g/dL — ABNORMAL LOW (ref 3.5–5.0)
Anion Gap: 5 mEq/L (ref 3–11)
BILIRUBIN TOTAL: 1.53 mg/dL — AB (ref 0.20–1.20)
BUN: 23.7 mg/dL (ref 7.0–26.0)
CO2: 29 meq/L (ref 22–29)
CREATININE: 1.7 mg/dL — AB (ref 0.6–1.1)
Calcium: 9.4 mg/dL (ref 8.4–10.4)
Chloride: 106 mEq/L (ref 98–109)
EGFR: 30 mL/min/{1.73_m2} — AB (ref >90)
Glucose: 91 mg/dl (ref 70–140)
POTASSIUM: 4.3 meq/L (ref 3.5–5.1)
SODIUM: 140 meq/L (ref 136–145)
TOTAL PROTEIN: 6 g/dL — AB (ref 6.4–8.3)

## 2016-03-02 LAB — CBC WITH DIFFERENTIAL/PLATELET
BASO%: 0.4 % (ref 0.0–2.0)
BASOS ABS: 0 10*3/uL (ref 0.0–0.1)
EOS%: 6.3 % (ref 0.0–7.0)
Eosinophils Absolute: 0.3 10*3/uL (ref 0.0–0.5)
HCT: 36 % (ref 34.8–46.6)
HGB: 12 g/dL (ref 11.6–15.9)
LYMPH%: 10.6 % — ABNORMAL LOW (ref 14.0–49.7)
MCH: 33.2 pg (ref 25.1–34.0)
MCHC: 33.3 g/dL (ref 31.5–36.0)
MCV: 99.7 fL (ref 79.5–101.0)
MONO#: 0.5 10*3/uL (ref 0.1–0.9)
MONO%: 9.5 % (ref 0.0–14.0)
NEUT#: 3.9 10*3/uL (ref 1.5–6.5)
NEUT%: 73.2 % (ref 38.4–76.8)
NRBC: 0 % (ref 0–0)
Platelets: 164 10*3/uL (ref 145–400)
RBC: 3.61 10*6/uL — AB (ref 3.70–5.45)
RDW: 14.1 % (ref 11.2–14.5)
WBC: 5.3 10*3/uL (ref 3.9–10.3)
lymph#: 0.6 10*3/uL — ABNORMAL LOW (ref 0.9–3.3)

## 2016-03-02 MED ORDER — SODIUM CHLORIDE 0.9% FLUSH
10.0000 mL | INTRAVENOUS | Status: DC | PRN
  Administered 2016-03-02: 10 mL via INTRAVENOUS
  Filled 2016-03-02: qty 10

## 2016-03-02 MED ORDER — HEPARIN SOD (PORK) LOCK FLUSH 100 UNIT/ML IV SOLN
500.0000 [IU] | Freq: Once | INTRAVENOUS | Status: AC
  Administered 2016-03-02: 500 [IU] via INTRAVENOUS
  Filled 2016-03-02: qty 5

## 2016-03-02 NOTE — Patient Instructions (Signed)

## 2016-03-02 NOTE — Progress Notes (Signed)
[facility] Telephone:([phone]   Fax:(336) [phone]  OFFICE PROGRESS NOTE  [name], MD [address] [place] [address]  DIAGNOSIS: Recurrent non-small cell lung cancer, adenocarcinoma initially diagnosed as stage IIA in March 2008. The tumor cells were negative for EGFR mutation as well as ALK gene translocation.   PRIOR THERAPY:  1. Status post concurrent chemoradiation with weekly carboplatin and paclitaxel. Last dose was given Apr 12, 2007. 2. Status post 3 cycles of consolidation chemotherapy with docetaxel. Last dose was given July 12, 2007. 3. 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 4. Status post 39 cycles of maintenance Alimta at a dose of 500 mg/m2. Last dose was given February 25, 2010, discontinued secondary to persistent anemia, but the patient has stable disease. 5. Tarceva 150 mg by mouth daily. Status post approximately 37 months of therapy.    CURRENT THERAPY: Tarceva 100 mg by mouth daily. Status post 3 month of therapy.   CHEMOTHERAPY INTENT: Palliative  CURRENT # OF CHEMOTHERAPY CYCLES: 41 CURRENT ANTIEMETICS: Compazine when necessary  CURRENT SMOKING STATUS: Former smoker quit 11/27/2001  ORAL CHEMOTHERAPY AND CONSENT: Tarceva  CURRENT BISPHOSPHONATES USE: None  PAIN MANAGEMENT: 0/10  NARCOTICS INDUCED CONSTIPATION: None  LIVING WILL AND CODE STATUS: Full code initially but no prolonged resuscitation.   INTERVAL HISTORY: Diane [name] 75 y.o. female returns to the clinic today for routine monthly followup visit. The patient is feeling fine today with no specific complaints. She is tolerating her treatment with Tarceva 100 mg by mouth daily much better. She was admitted to [facility] recently and she had postnasal drainage and runny nose. She was treated with Claritin and Flonase. She continues to have mild skin rash on the lower extremities and some  scabs in her Scalp. No significant diarrhea.  She has no significant fever or chills no nausea or vomiting. She has shortness of breath with exertion with no cough or hemoptysis. She denied having any significant weight loss or night sweats. She has no nausea or vomiting. She had repeat bloodwork performed earlier today and she is here for evaluation and discussion of her lab results.  MEDICAL HISTORY: Past Medical History  Diagnosis Date  . Hypertension   . Renal insufficiency     stage 3 renal disease/ pt  . lung ca dx'd 02/2010  . 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  MEDICATIONS:  Current Outpatient Prescriptions  Medication Sig Dispense Refill  . aspirin 81 MG tablet Take 81 mg by mouth daily.      . calcitRIOL (ROCALTROL) 0.25 MCG capsule Take 0.25 mcg by mouth every Monday, Wednesday, and Friday.    . erlotinib (TARCEVA) 100 MG tablet Take 1 tablet (100 mg total) by mouth daily. Take on an empty stomach 1 hour before meals or 2 hours after. 30 tablet 2  . labetalol (NORMODYNE) 200 MG tablet Take 200 mg by mouth 2 (two) times daily. Take 1 1/2 pills two times daily    . methylPREDNISolone (MEDROL DOSEPAK) 4 MG TBPK tablet     . neomycin-polymyxin b-dexamethasone (MAXITROL) 3.5-10000-0.1 SUSP Place 1 drop into both eyes.     . polyethylene glycol (MIRALAX / GLYCOLAX) packet Take 17 g by mouth daily.    Marland Kitchen torsemide (DEMADEX) 20 MG tablet Take 20 mg by mouth daily.       No current  facility-administered medications for this visit.   Facility-Administered Medications Ordered in Other Visits  Medication Dose Route Frequency Provider Last Rate Last Dose  . sodium chloride 0.9 % injection 10 mL  10 mL Intravenous PRN Curt Bears, MD   10 mL at 10/14/13 1030    SURGICAL HISTORY:  Past Surgical History  Procedure Laterality Date  . Portacath placement  11/03/08-[facility]    tip in mid SVC-E. Mansell    REVIEW OF SYSTEMS:  A  comprehensive review of systems was negative except for: Ears, nose, mouth, throat, and face: positive for nasal congestion and sore throat Integument/breast: positive for rash   PHYSICAL EXAMINATION: General appearance: alert, cooperative and no distress Head: Normocephalic, without obvious abnormality, atraumatic Neck: no adenopathy, no carotid bruit, no JVD, supple, symmetrical, trachea midline and thyroid not enlarged, symmetric, no tenderness/mass/nodules Lymph nodes: Cervical, supraclavicular, and axillary nodes normal. Resp: clear to auscultation bilaterally and normal percussion bilaterally Back: symmetric, no curvature. ROM normal. No CVA tenderness. Cardio: regular rate and rhythm, S1, S2 normal, no murmur, click, rub or gallop GI: soft, non-tender; bowel sounds normal; no masses,  no organomegaly Extremities: extremities normal, atraumatic, no cyanosis or edema Neurologic: Alert and oriented X 3, normal strength and tone. Normal symmetric reflexes. Normal coordination and gait  ECOG PERFORMANCE STATUS: 1 - Symptomatic but completely ambulatory  Blood pressure 151/58, pulse 87, temperature 97.8 F (36.6 C), temperature source Oral, resp. rate 18, height _0  (1.676 m), weight 244 lb (110.678 kg), SpO2 96 %.  LABORATORY DATA: Lab Results  Component Value Date   WBC 5.3 03/02/2016   HGB 12.0 03/02/2016   HCT 36.0 03/02/2016   MCV 99.7 03/02/2016   PLT 164 03/02/2016      Chemistry      Component Value Date/Time   NA 140 01/28/2016 0744   NA 137 11/14/2012 0816   NA 143 07/11/2012 0927   K 4.5 01/28/2016 0744   K 3.9 11/14/2012 0816   K 4.5 07/11/2012 0927   CL 102 05/08/2013 0851   CL 101 11/14/2012 0816   CL 101 07/11/2012 0927   CO2 27 01/28/2016 0744   CO2 28 11/14/2012 0816   CO2 29 07/11/2012 0927   BUN 23.1 01/28/2016 0744   BUN 17 11/14/2012 0816   BUN 16 07/11/2012 0927   CREATININE 1.6* 01/28/2016 0744   CREATININE 1.52* 11/14/2012 0816   CREATININE  1.5* 07/11/2012 0927      Component Value Date/Time   CALCIUM 9.8 01/28/2016 0744   CALCIUM 10.2 11/14/2012 0816   CALCIUM 10.0 07/11/2012 0927   ALKPHOS 66 01/28/2016 0744   ALKPHOS 74 11/14/2012 0816   ALKPHOS 71 07/11/2012 0927   AST 15 01/28/2016 0744   AST 16 11/14/2012 0816   AST 19 07/11/2012 0927   ALT 11 01/28/2016 0744   ALT 8 11/14/2012 0816   ALT 18 07/11/2012 0927   BILITOT 1.27* 01/28/2016 0744   BILITOT 1.2 11/14/2012 0816   BILITOT 0.80 07/11/2012 0927       RADIOGRAPHIC STUDIES: No results found.  ASSESSMENT AND PLAN: This is a very pleasant 75 years old white female with metastatic non-small cell lung cancer currently on treatment with Tarceva 150 mg by mouth daily status post 37 months of treatment and tolerating it fairly well except for dry skin and grade 1-2 skin rash on the face and fingers as well as mild paronychia.  She is currently on treatment with Tarceva 100 mg by mouth daily status  post 3 months and tolerating it well. CBC today is unremarkable. Comprehensive metabolic panel is a still pending. I recommended for the patient to continue her treatment with Tarceva 100 mg by mouth daily as scheduled. I would see her back for follow-up visit in one month for reevaluation with repeat blood work She will continue to have Port-A-Cath flush every 8 weeks. She was advised to call immediately if she has any concerning symptoms in the interval.  The patient voices understanding of current disease status and treatment options and is in agreement with the current care plan.  All questions were answered. The patient knows to call the clinic with any problems, questions or concerns. We can certainly see the patient much sooner if necessary.  Disclaimer: This note was dictated with voice recognition software. Similar sounding words can inadvertently be transcribed and may not be corrected upon review.

## 2016-03-30 ENCOUNTER — Telehealth: Payer: Self-pay | Admitting: Internal Medicine

## 2016-03-30 ENCOUNTER — Ambulatory Visit (HOSPITAL_BASED_OUTPATIENT_CLINIC_OR_DEPARTMENT_OTHER): Payer: Medicare Other | Admitting: Internal Medicine

## 2016-03-30 ENCOUNTER — Other Ambulatory Visit (HOSPITAL_BASED_OUTPATIENT_CLINIC_OR_DEPARTMENT_OTHER): Payer: Medicare Other

## 2016-03-30 ENCOUNTER — Encounter: Payer: Self-pay | Admitting: Internal Medicine

## 2016-03-30 ENCOUNTER — Other Ambulatory Visit: Payer: Self-pay | Admitting: *Deleted

## 2016-03-30 VITALS — BP 135/66 | HR 83 | Temp 98.1°F | Resp 17 | Ht 65.0 in | Wt 242.2 lb

## 2016-03-30 DIAGNOSIS — C3491 Malignant neoplasm of unspecified part of right bronchus or lung: Secondary | ICD-10-CM

## 2016-03-30 DIAGNOSIS — N289 Disorder of kidney and ureter, unspecified: Secondary | ICD-10-CM | POA: Diagnosis not present

## 2016-03-30 DIAGNOSIS — Z5111 Encounter for antineoplastic chemotherapy: Secondary | ICD-10-CM

## 2016-03-30 DIAGNOSIS — C3490 Malignant neoplasm of unspecified part of unspecified bronchus or lung: Secondary | ICD-10-CM

## 2016-03-30 DIAGNOSIS — R21 Rash and other nonspecific skin eruption: Secondary | ICD-10-CM | POA: Diagnosis not present

## 2016-03-30 LAB — CBC WITH DIFFERENTIAL/PLATELET
BASO%: 0.8 % (ref 0.0–2.0)
BASOS ABS: 0 10*3/uL (ref 0.0–0.1)
EOS ABS: 0.3 10*3/uL (ref 0.0–0.5)
EOS%: 5.2 % (ref 0.0–7.0)
HCT: 38.5 % (ref 34.8–46.6)
HGB: 12.9 g/dL (ref 11.6–15.9)
LYMPH#: 0.5 10*3/uL — AB (ref 0.9–3.3)
LYMPH%: 8.5 % — ABNORMAL LOW (ref 14.0–49.7)
MCH: 32.9 pg (ref 25.1–34.0)
MCHC: 33.4 g/dL (ref 31.5–36.0)
MCV: 98.5 fL (ref 79.5–101.0)
MONO#: 0.5 10*3/uL (ref 0.1–0.9)
MONO%: 10.2 % (ref 0.0–14.0)
NEUT%: 75.3 % (ref 38.4–76.8)
NEUTROS ABS: 4 10*3/uL (ref 1.5–6.5)
Platelets: 195 10*3/uL (ref 145–400)
RBC: 3.91 10*6/uL (ref 3.70–5.45)
RDW: 14 % (ref 11.2–14.5)
WBC: 5.3 10*3/uL (ref 3.9–10.3)

## 2016-03-30 LAB — COMPREHENSIVE METABOLIC PANEL
ALT: 9 U/L (ref 0–55)
ANION GAP: 7 meq/L (ref 3–11)
AST: 14 U/L (ref 5–34)
Albumin: 3.1 g/dL — ABNORMAL LOW (ref 3.5–5.0)
Alkaline Phosphatase: 62 U/L (ref 40–150)
BUN: 22.1 mg/dL (ref 7.0–26.0)
CHLORIDE: 103 meq/L (ref 98–109)
CO2: 30 meq/L — AB (ref 22–29)
Calcium: 10.1 mg/dL (ref 8.4–10.4)
Creatinine: 1.7 mg/dL — ABNORMAL HIGH (ref 0.6–1.1)
EGFR: 28 mL/min/{1.73_m2} — AB (ref >90)
GLUCOSE: 75 mg/dL (ref 70–140)
POTASSIUM: 4.5 meq/L (ref 3.5–5.1)
SODIUM: 140 meq/L (ref 136–145)
Total Bilirubin: 1.6 mg/dL — ABNORMAL HIGH (ref 0.20–1.20)
Total Protein: 6.2 g/dL — ABNORMAL LOW (ref 6.4–8.3)

## 2016-03-30 MED ORDER — ERLOTINIB HCL 100 MG PO TABS: 100.0000 mg | ORAL_TABLET | Freq: Every day | ORAL | Status: DC

## 2016-03-30 MED ORDER — DOXYCYCLINE HYCLATE 100 MG PO TABS: 100.0000 mg | ORAL_TABLET | Freq: Two times a day (BID) | ORAL | Status: DC

## 2016-03-30 NOTE — Telephone Encounter (Signed)
per pof to sch pt appt-pt stated didnr want to come in 1 month will cone a week after-sch appt-adv central sch willc all to ch pt scan-gave avs

## 2016-03-30 NOTE — Telephone Encounter (Signed)
per pof to sch pt appt-gave pt copy of avs-adv central sch will call to sch scan

## 2016-03-30 NOTE — Progress Notes (Signed)
[place] [phone]   Fax:([phone]  OFFICE PROGRESS NOTE  [name], MD [address] [place] [address]  DIAGNOSIS: Recurrent non-small cell lung cancer, adenocarcinoma initially diagnosed as stage IIA in March 2008. The tumor cells were negative for EGFR mutation as well as ALK gene translocation.   PRIOR THERAPY:  1. Status post concurrent chemoradiation with weekly carboplatin and paclitaxel. Last dose was given Apr 12, 2007. 2. Status post 3 cycles of consolidation chemotherapy with docetaxel. Last dose was given July 12, 2007. 3. 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 4. Status post 39 cycles of maintenance Alimta at a dose of 500 mg/m2. Last dose was given February 25, 2010, discontinued secondary to persistent anemia, but the patient has stable disease. 5. Tarceva 150 mg by mouth daily. Status post approximately 37 months of therapy.    CURRENT THERAPY: Tarceva 100 mg by mouth daily. Status post 4 month of therapy.   CHEMOTHERAPY INTENT: Palliative  CURRENT # OF CHEMOTHERAPY CYCLES: 42 CURRENT ANTIEMETICS: Compazine when necessary  CURRENT SMOKING STATUS: Former smoker quit 11/27/2001  ORAL CHEMOTHERAPY AND CONSENT: Tarceva  CURRENT BISPHOSPHONATES USE: None  PAIN MANAGEMENT: 0/10  NARCOTICS INDUCED CONSTIPATION: None  LIVING WILL AND CODE STATUS: Full code initially but no prolonged resuscitation.   INTERVAL HISTORY: Diane [name] 75 y.o. female returns to the clinic today for routine monthly followup visit. The patient is feeling fine today with no specific complaints. She is tolerating her treatment with Tarceva 100 mg by mouth daily much better. She had few scabs on the head with some discharge. No significant diarrhea.  She has no significant fever or chills no nausea or vomiting. She has shortness of breath with exertion with no cough or  hemoptysis. She denied having any significant weight loss or night sweats. She has no nausea or vomiting. She had repeat bloodwork performed earlier today and she is here for evaluation and discussion of her lab results.  MEDICAL HISTORY: Past Medical History  Diagnosis Date  . Hypertension   . Renal insufficiency     stage 3 renal disease/ pt  . lung ca dx'd 02/2010  . 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  MEDICATIONS:  Current Outpatient Prescriptions  Medication Sig Dispense Refill  . aspirin 81 MG tablet Take 81 mg by mouth daily.      . calcitRIOL (ROCALTROL) 0.25 MCG capsule Take 0.25 mcg by mouth every Monday, Wednesday, and Friday.    . erlotinib (TARCEVA) 100 MG tablet Take 1 tablet (100 mg total) by mouth daily. Take on an empty stomach 1 hour before meals or 2 hours after. 30 tablet 2  . labetalol (NORMODYNE) 200 MG tablet Take 200 mg by mouth 2 (two) times daily. Take 1 1/2 pills two times daily    . neomycin-polymyxin b-dexamethasone (MAXITROL) 3.5-10000-0.1 SUSP Place 1 drop into both eyes.     . polyethylene glycol (MIRALAX / GLYCOLAX) packet Take 17 g by mouth daily.    Marland Kitchen torsemide (DEMADEX) 20 MG tablet Take 20 mg by mouth daily.       No current facility-administered medications for this visit.   Facility-Administered Medications Ordered in Other Visits  Medication Dose Route Frequency Provider Last Rate Last Dose  . sodium chloride 0.9 % injection 10 mL  10 mL Intravenous PRN Curt Bears, MD  10 mL at 10/14/13 1030    SURGICAL HISTORY:  Past Surgical History  Procedure Laterality Date  . Portacath placement  11/03/08-[facility]    tip in mid SVC-E. Mansell    REVIEW OF SYSTEMS:  A comprehensive review of systems was negative except for: Integument/breast: positive for rash   PHYSICAL EXAMINATION: General appearance: alert, cooperative and no distress Head: Normocephalic, without obvious abnormality,  atraumatic Neck: no adenopathy, no carotid bruit, no JVD, supple, symmetrical, trachea midline and thyroid not enlarged, symmetric, no tenderness/mass/nodules Lymph nodes: Cervical, supraclavicular, and axillary nodes normal. Resp: clear to auscultation bilaterally and normal percussion bilaterally Back: symmetric, no curvature. ROM normal. No CVA tenderness. Cardio: regular rate and rhythm, S1, S2 normal, no murmur, click, rub or gallop GI: soft, non-tender; bowel sounds normal; no masses,  no organomegaly Extremities: extremities normal, atraumatic, no cyanosis or edema Neurologic: Alert and oriented X 3, normal strength and tone. Normal symmetric reflexes. Normal coordination and gait  ECOG PERFORMANCE STATUS: 1 - Symptomatic but completely ambulatory  Blood pressure 135/66, pulse 83, temperature 98.1 F (36.7 C), temperature source Oral, resp. rate 17, height '5\' 5"'$  (1.651 m), weight 242 lb 3.2 oz (109.861 kg), SpO2 98 %.  LABORATORY DATA: Lab Results  Component Value Date   WBC 5.3 03/30/2016   HGB 12.9 03/30/2016   HCT 38.5 03/30/2016   MCV 98.5 03/30/2016   PLT 195 03/30/2016      Chemistry      Component Value Date/Time   NA 140 03/02/2016 0952   NA 137 11/14/2012 0816   NA 143 07/11/2012 0927   K 4.3 03/02/2016 0952   K 3.9 11/14/2012 0816   K 4.5 07/11/2012 0927   CL 102 05/08/2013 0851   CL 101 11/14/2012 0816   CL 101 07/11/2012 0927   CO2 29 03/02/2016 0952   CO2 28 11/14/2012 0816   CO2 29 07/11/2012 0927   BUN 23.7 03/02/2016 0952   BUN 17 11/14/2012 0816   BUN 16 07/11/2012 0927   CREATININE 1.7* 03/02/2016 0952   CREATININE 1.52* 11/14/2012 0816   CREATININE 1.5* 07/11/2012 0927      Component Value Date/Time   CALCIUM 9.4 03/02/2016 0952   CALCIUM 10.2 11/14/2012 0816   CALCIUM 10.0 07/11/2012 0927   ALKPHOS 61 03/02/2016 0952   ALKPHOS 74 11/14/2012 0816   ALKPHOS 71 07/11/2012 0927   AST 13 03/02/2016 0952   AST 16 11/14/2012 0816   AST 19  07/11/2012 0927   ALT <9 03/02/2016 0952   ALT 8 11/14/2012 0816   ALT 18 07/11/2012 0927   BILITOT 1.53* 03/02/2016 0952   BILITOT 1.2 11/14/2012 0816   BILITOT 0.80 07/11/2012 0927       RADIOGRAPHIC STUDIES: No results found.  ASSESSMENT AND PLAN: This is a very pleasant 75 years old white female with metastatic non-small cell lung cancer currently on treatment with Tarceva 150 mg by mouth daily status post 37 months of treatment and tolerating it fairly well except for dry skin and grade 1-2 skin rash on the face and fingers as well as mild paronychia.  She is currently on treatment with Tarceva 100 mg by mouth daily status post 4 months and tolerating it well. CBC today is unremarkable. Comprehensive metabolic panel showed persistent mild renal insufficiency. I recommended for the patient to continue her treatment with Tarceva 100 mg by mouth daily as scheduled. I would see her back for follow-up visit in one month for reevaluation with repeat blood  work as well as CT scan of the chest without contrast. She will continue to have Port-A-Cath flush every 6 weeks. She was advised to call immediately if she has any concerning symptoms in the interval.  The patient voices understanding of current disease status and treatment options and is in agreement with the current care plan.  All questions were answered. The patient knows to call the clinic with any problems, questions or concerns. We can certainly see the patient much sooner if necessary.  Disclaimer: This note was dictated with voice recognition software. Similar sounding words can inadvertently be transcribed and may not be corrected upon review.

## 2016-04-25 ENCOUNTER — Ambulatory Visit (HOSPITAL_BASED_OUTPATIENT_CLINIC_OR_DEPARTMENT_OTHER): Payer: Medicare Other

## 2016-04-25 ENCOUNTER — Ambulatory Visit (HOSPITAL_COMMUNITY)
Admission: RE | Admit: 2016-04-25 | Discharge: 2016-04-25 | Disposition: A | Discharge status: Home / Self Care | Payer: Medicare Other | Source: Ambulatory Visit | Attending: Internal Medicine | Admitting: Internal Medicine

## 2016-04-25 ENCOUNTER — Other Ambulatory Visit (HOSPITAL_BASED_OUTPATIENT_CLINIC_OR_DEPARTMENT_OTHER): Payer: Medicare Other

## 2016-04-25 VITALS — BP 140/60 | HR 79 | Temp 98.1°F | Resp 18

## 2016-04-25 DIAGNOSIS — J948 Other specified pleural conditions: Secondary | ICD-10-CM | POA: Diagnosis not present

## 2016-04-25 DIAGNOSIS — J984 Other disorders of lung: Secondary | ICD-10-CM | POA: Diagnosis not present

## 2016-04-25 DIAGNOSIS — Z95828 Presence of other vascular implants and grafts: Secondary | ICD-10-CM

## 2016-04-25 DIAGNOSIS — C3491 Malignant neoplasm of unspecified part of right bronchus or lung: Secondary | ICD-10-CM

## 2016-04-25 DIAGNOSIS — E042 Nontoxic multinodular goiter: Secondary | ICD-10-CM | POA: Diagnosis not present

## 2016-04-25 DIAGNOSIS — I700 Atherosclerosis of aorta: Secondary | ICD-10-CM | POA: Insufficient documentation

## 2016-04-25 DIAGNOSIS — Z5111 Encounter for antineoplastic chemotherapy: Secondary | ICD-10-CM

## 2016-04-25 DIAGNOSIS — I2510 Atherosclerotic heart disease of native coronary artery without angina pectoris: Secondary | ICD-10-CM | POA: Diagnosis not present

## 2016-04-25 LAB — CBC WITH DIFFERENTIAL/PLATELET
BASO%: 0.9 % (ref 0.0–2.0)
BASOS ABS: 0 10*3/uL (ref 0.0–0.1)
EOS%: 5.7 % (ref 0.0–7.0)
Eosinophils Absolute: 0.3 10*3/uL (ref 0.0–0.5)
HCT: 36.3 % (ref 34.8–46.6)
HEMOGLOBIN: 12.1 g/dL (ref 11.6–15.9)
LYMPH#: 0.5 10*3/uL — AB (ref 0.9–3.3)
LYMPH%: 9.6 % — ABNORMAL LOW (ref 14.0–49.7)
MCH: 32.6 pg (ref 25.1–34.0)
MCHC: 33.3 g/dL (ref 31.5–36.0)
MCV: 97.7 fL (ref 79.5–101.0)
MONO#: 0.5 10*3/uL (ref 0.1–0.9)
MONO%: 10.1 % (ref 0.0–14.0)
NEUT#: 3.6 10*3/uL (ref 1.5–6.5)
NEUT%: 73.7 % (ref 38.4–76.8)
PLATELETS: 182 10*3/uL (ref 145–400)
RBC: 3.71 10*6/uL (ref 3.70–5.45)
RDW: 13.7 % (ref 11.2–14.5)
WBC: 4.9 10*3/uL (ref 3.9–10.3)

## 2016-04-25 LAB — COMPREHENSIVE METABOLIC PANEL
ALBUMIN: 3 g/dL — AB (ref 3.5–5.0)
ALK PHOS: 58 U/L (ref 40–150)
ALT: 10 U/L (ref 0–55)
ANION GAP: 7 meq/L (ref 3–11)
AST: 15 U/L (ref 5–34)
BILIRUBIN TOTAL: 1.54 mg/dL — AB (ref 0.20–1.20)
BUN: 21.9 mg/dL (ref 7.0–26.0)
CO2: 28 meq/L (ref 22–29)
CREATININE: 1.5 mg/dL — AB (ref 0.6–1.1)
Calcium: 9.6 mg/dL (ref 8.4–10.4)
Chloride: 105 mEq/L (ref 98–109)
EGFR: 34 mL/min/{1.73_m2} — ABNORMAL LOW (ref >90)
GLUCOSE: 101 mg/dL (ref 70–140)
Potassium: 3.9 mEq/L (ref 3.5–5.1)
SODIUM: 140 meq/L (ref 136–145)
TOTAL PROTEIN: 6 g/dL — AB (ref 6.4–8.3)

## 2016-04-25 MED ORDER — HEPARIN SOD (PORK) LOCK FLUSH 100 UNIT/ML IV SOLN
500.0000 [IU] | Freq: Once | INTRAVENOUS | Status: AC
Start: 2016-04-25 — End: 2016-04-25
  Administered 2016-04-25: 500 [IU] via INTRAVENOUS
  Filled 2016-04-25: qty 5

## 2016-04-25 MED ORDER — SODIUM CHLORIDE 0.9% FLUSH
10.0000 mL | INTRAVENOUS | Status: DC | PRN
  Administered 2016-04-25: 10 mL via INTRAVENOUS
  Filled 2016-04-25: qty 10

## 2016-04-27 ENCOUNTER — Ambulatory Visit: Payer: Medicare Other | Admitting: Internal Medicine

## 2016-05-04 ENCOUNTER — Ambulatory Visit (HOSPITAL_BASED_OUTPATIENT_CLINIC_OR_DEPARTMENT_OTHER): Payer: Medicare Other | Admitting: Internal Medicine

## 2016-05-04 ENCOUNTER — Telehealth: Payer: Self-pay | Admitting: Internal Medicine

## 2016-05-04 ENCOUNTER — Encounter: Payer: Self-pay | Admitting: Internal Medicine

## 2016-05-04 VITALS — BP 138/64 | HR 84 | Temp 98.3°F | Resp 18 | Wt 243.4 lb

## 2016-05-04 DIAGNOSIS — L03019 Cellulitis of unspecified finger: Secondary | ICD-10-CM

## 2016-05-04 DIAGNOSIS — C3491 Malignant neoplasm of unspecified part of right bronchus or lung: Secondary | ICD-10-CM | POA: Diagnosis not present

## 2016-05-04 DIAGNOSIS — Z5111 Encounter for antineoplastic chemotherapy: Secondary | ICD-10-CM

## 2016-05-04 NOTE — Telephone Encounter (Signed)
Gave and printed appt sched and avs for pt for July Aug and Sept

## 2016-05-04 NOTE — Progress Notes (Signed)
[place] [phone]   Fax:(336) [phone]  OFFICE PROGRESS NOTE  [name], MD [address] [place] [address]  DIAGNOSIS: Recurrent non-small cell lung cancer, adenocarcinoma initially diagnosed as stage IIA in March 2008. The tumor cells were negative for EGFR mutation as well as ALK gene translocation.   PRIOR THERAPY:  1. Status post concurrent chemoradiation with weekly carboplatin and paclitaxel. Last dose was given Apr 12, 2007. 2. Status post 3 cycles of consolidation chemotherapy with docetaxel. Last dose was given July 12, 2007. 3. 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 4. Status post 39 cycles of maintenance Alimta at a dose of 500 mg/m2. Last dose was given February 25, 2010, discontinued secondary to persistent anemia, but the patient has stable disease. 5. Tarceva 150 mg by mouth daily. Status post approximately 37 months of therapy.    CURRENT THERAPY: Tarceva 100 mg by mouth daily. Status post 5 month of therapy.   CHEMOTHERAPY INTENT: Palliative  CURRENT # OF CHEMOTHERAPY CYCLES: 43 CURRENT ANTIEMETICS: Compazine when necessary  CURRENT SMOKING STATUS: Former smoker quit 11/27/2001  ORAL CHEMOTHERAPY AND CONSENT: Tarceva  CURRENT BISPHOSPHONATES USE: None  PAIN MANAGEMENT: 0/10  NARCOTICS INDUCED CONSTIPATION: None  LIVING WILL AND CODE STATUS: Full code initially but no prolonged resuscitation.   INTERVAL HISTORY: Diane [name] 75 y.o. female returns to the clinic today for routine monthly followup visit. The patient is feeling fine today with no specific complaints. She is tolerating her treatment with Tarceva 100 mg by mouth daily much better. She continues to have few scabs on the head. No significant diarrhea.  She has no significant fever or chills no nausea or vomiting. She has shortness of breath with exertion with no cough or  hemoptysis. She denied having any significant weight loss or night sweats. She has no nausea or vomiting. She had repeat CT scan of the chest performed recently and she is here for evaluation and discussion of her scan results.  MEDICAL HISTORY: Past Medical History  Diagnosis Date  . Hypertension   . Renal insufficiency     stage 3 renal disease/ pt  . lung ca dx'd 02/2010  . 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  MEDICATIONS:  Current Outpatient Prescriptions  Medication Sig Dispense Refill  . aspirin 81 MG tablet Take 81 mg by mouth daily.      . calcitRIOL (ROCALTROL) 0.25 MCG capsule Take 0.25 mcg by mouth every Monday, Wednesday, and Friday.    Marland Kitchen doxycycline (VIBRA-TABS) 100 MG tablet Take 1 tablet (100 mg total) by mouth 2 (two) times daily. 14 tablet 0  . erlotinib (TARCEVA) 100 MG tablet Take 1 tablet (100 mg total) by mouth daily. Take on an empty stomach 1 hour before meals or 2 hours after. 30 tablet 2  . labetalol (NORMODYNE) 200 MG tablet Take 200 mg by mouth 2 (two) times daily. Take 1 1/2 pills two times daily    . neomycin-polymyxin b-dexamethasone (MAXITROL) 3.5-10000-0.1 SUSP Place 1 drop into both eyes.     . polyethylene glycol (MIRALAX / GLYCOLAX) packet Take 17 g by mouth daily.    Marland Kitchen torsemide (DEMADEX) 20 MG tablet Take 20 mg by mouth daily.       No current facility-administered medications for this visit.   Facility-Administered Medications Ordered in Other Visits  Medication Dose  Route Frequency Provider Last Rate Last Dose  . sodium chloride 0.9 % injection 10 mL  10 mL Intravenous PRN Curt Bears, MD   10 mL at 10/14/13 1030    SURGICAL HISTORY:  Past Surgical History  Procedure Laterality Date  . Portacath placement  11/03/08-[facility]    tip in mid SVC-E. Mansell    REVIEW OF SYSTEMS:  Constitutional: positive for fatigue Eyes: negative Ears, nose, mouth, throat, and face:  negative Respiratory: positive for dyspnea on exertion Cardiovascular: negative Gastrointestinal: negative Genitourinary:negative Integument/breast: negative Hematologic/lymphatic: negative Musculoskeletal:negative Neurological: negative Behavioral/Psych: negative Endocrine: negative Allergic/Immunologic: negative   PHYSICAL EXAMINATION: General appearance: alert, cooperative and no distress Head: Normocephalic, without obvious abnormality, atraumatic Neck: no adenopathy, no carotid bruit, no JVD, supple, symmetrical, trachea midline and thyroid not enlarged, symmetric, no tenderness/mass/nodules Lymph nodes: Cervical, supraclavicular, and axillary nodes normal. Resp: clear to auscultation bilaterally and normal percussion bilaterally Back: symmetric, no curvature. ROM normal. No CVA tenderness. Cardio: regular rate and rhythm, S1, S2 normal, no murmur, click, rub or gallop GI: soft, non-tender; bowel sounds normal; no masses,  no organomegaly Extremities: extremities normal, atraumatic, no cyanosis or edema Neurologic: Alert and oriented X 3, normal strength and tone. Normal symmetric reflexes. Normal coordination and gait  ECOG PERFORMANCE STATUS: 1 - Symptomatic but completely ambulatory  Blood pressure 138/64, pulse 84, temperature 98.3 F (36.8 C), temperature source Oral, resp. rate 18, weight 243 lb 6.4 oz (110.406 kg), SpO2 96 %.  LABORATORY DATA: Lab Results  Component Value Date   WBC 4.9 04/25/2016   HGB 12.1 04/25/2016   HCT 36.3 04/25/2016   MCV 97.7 04/25/2016   PLT 182 04/25/2016      Chemistry      Component Value Date/Time   NA 140 04/25/2016 0914   NA 137 11/14/2012 0816   NA 143 07/11/2012 0927   K 3.9 04/25/2016 0914   K 3.9 11/14/2012 0816   K 4.5 07/11/2012 0927   CL 102 05/08/2013 0851   CL 101 11/14/2012 0816   CL 101 07/11/2012 0927   CO2 28 04/25/2016 0914   CO2 28 11/14/2012 0816   CO2 29 07/11/2012 0927   BUN 21.9 04/25/2016 0914    BUN 17 11/14/2012 0816   BUN 16 07/11/2012 0927   CREATININE 1.5* 04/25/2016 0914   CREATININE 1.52* 11/14/2012 0816   CREATININE 1.5* 07/11/2012 0927      Component Value Date/Time   CALCIUM 9.6 04/25/2016 0914   CALCIUM 10.2 11/14/2012 0816   CALCIUM 10.0 07/11/2012 0927   ALKPHOS 58 04/25/2016 0914   ALKPHOS 74 11/14/2012 0816   ALKPHOS 71 07/11/2012 0927   AST 15 04/25/2016 0914   AST 16 11/14/2012 0816   AST 19 07/11/2012 0927   ALT 10 04/25/2016 0914   ALT 8 11/14/2012 0816   ALT 18 07/11/2012 0927   BILITOT 1.54* 04/25/2016 0914   BILITOT 1.2 11/14/2012 0816   BILITOT 0.80 07/11/2012 0927       RADIOGRAPHIC STUDIES: Ct Chest Wo Contrast  04/25/2016  CLINICAL DATA:  Ongoing oral chemotherapy for lung cancer originally diagnosed in 2008. EXAM: CT CHEST WITHOUT CONTRAST TECHNIQUE: Multidetector CT imaging of the chest was performed following the standard protocol without IV contrast. COMPARISON:  01/28/2016 FINDINGS: Mediastinum/Nodes: Coronary, aortic arch, and branch vessel atherosclerotic vascular disease. Rim calcified nodule inferiorly along the right thyroid gland 1.9 by 1.1 cm, not appreciably changed. Hypodense nodule inferiorly in the left thyroid lobe 1 cm diameter. Mild cardiomegaly. Lungs/Pleura:  Centrilobular emphysema. Continued thick peribronchovascular soft tissue density in the right hilum tracking along upper lobe, lower lobe, and less notably middle lobe bronchi. This has a more rounded and masslike appearance in the lower lobe, with collapse and truncation of the right lower lobe bronchi shown on images 56-61 series 13, worsened from prior. Volume loss in the right lower lobe. Scarring in what appears to be the right middle lobe. There is some ground-glass density bands in the right upper lobe and right middle lobe which appears stable from the most recent prior exam. However, some of these appear mildly progressive compared to 02/10/2014, for example the  ground-glass opacity in the right middle lobe measures 1.8 cm along its oblique short axis on image 50/13, previously 0.6 cm. This progression is minimal. Complex pleural fluid collection at the right lung base posteriorly with some calcifications and thick marginal walls. Upper abdomen: Unremarkable Musculoskeletal: Thoracic kyphosis and spondylosis with multilevel degenerative endplate findings. IMPRESSION: 1. Peribronchovascular soft tissue density noted centrally in the right lung, similar to prior, although with some increase in truncation of the right lower lobe bronchi compared to prior, likely due to plugging. The rounded right lower lobe opacity is increased and probably related to atelectasis or postobstructive pneumonia. Strictly speaking tumor in this vicinity is not excluded. 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 2. There is some scattered sub solid/ ground-glass opacities in the right lung which are stable from 01/28/2016, but at least 1 right middle lobe opacity is slightly larger than it was on 02/10/2014. Low grade adenocarcinoma is not excluded in surveillance is recommended. 3. Coronary, aortic arch, and branch vessel atherosclerotic vascular disease. 4. Stable complex right basilar pleural fluid collection. 5. Stable multinodular goiter. Electronically Signed   By: Van Clines M.D.   On: 04/25/2016 11:15    ASSESSMENT AND PLAN: This is a very pleasant 75 years old white female with metastatic non-small cell lung cancer currently on treatment with Tarceva 150 mg by mouth daily status post 37 months of treatment and tolerating it fairly well except for dry skin and grade 1-2 skin rash on the face and fingers as well as mild paronychia.  She is currently on treatment with Tarceva 100 mg by mouth daily status post 5 months and tolerating it well.  The recent CT scan of the chest showed no clear evidence for disease progression.  I discussed the scan results with the patient today. I recommended for her to continue with her current treatment with Tarceva 100 mg by mouth daily. I would see her back for follow-up visit in one month for reevaluation with repeat blood work. She was advised to call immediately if she has any concerning symptoms in the interval.  The patient voices understanding of current disease status and treatment options and is in agreement with the current care plan.  All questions were answered. The patient knows to call the clinic with any problems, questions or concerns. We can certainly see the patient much sooner if necessary.  Disclaimer: This note was dictated with voice recognition software. Similar sounding words can inadvertently be transcribed and may not be corrected upon review.

## 2016-06-05 ENCOUNTER — Encounter: Payer: Self-pay | Admitting: Internal Medicine

## 2016-06-05 ENCOUNTER — Ambulatory Visit (HOSPITAL_BASED_OUTPATIENT_CLINIC_OR_DEPARTMENT_OTHER): Payer: Medicare Other | Admitting: Internal Medicine

## 2016-06-05 ENCOUNTER — Ambulatory Visit (HOSPITAL_BASED_OUTPATIENT_CLINIC_OR_DEPARTMENT_OTHER): Payer: Medicare Other

## 2016-06-05 ENCOUNTER — Telehealth: Payer: Self-pay | Admitting: Internal Medicine

## 2016-06-05 ENCOUNTER — Other Ambulatory Visit (HOSPITAL_BASED_OUTPATIENT_CLINIC_OR_DEPARTMENT_OTHER): Payer: Medicare Other

## 2016-06-05 VITALS — BP 132/72 | HR 88 | Temp 97.9°F | Resp 18 | Ht 65.0 in | Wt 241.5 lb

## 2016-06-05 DIAGNOSIS — R21 Rash and other nonspecific skin eruption: Secondary | ICD-10-CM

## 2016-06-05 DIAGNOSIS — C3491 Malignant neoplasm of unspecified part of right bronchus or lung: Secondary | ICD-10-CM

## 2016-06-05 DIAGNOSIS — Z95828 Presence of other vascular implants and grafts: Secondary | ICD-10-CM

## 2016-06-05 DIAGNOSIS — Z5111 Encounter for antineoplastic chemotherapy: Secondary | ICD-10-CM

## 2016-06-05 LAB — CBC WITH DIFFERENTIAL/PLATELET
BASO%: 0.9 % (ref 0.0–2.0)
BASOS ABS: 0 10*3/uL (ref 0.0–0.1)
EOS ABS: 0.2 10*3/uL (ref 0.0–0.5)
EOS%: 5.2 % (ref 0.0–7.0)
HCT: 37.5 % (ref 34.8–46.6)
HEMOGLOBIN: 12.3 g/dL (ref 11.6–15.9)
LYMPH%: 12.6 % — ABNORMAL LOW (ref 14.0–49.7)
MCH: 32.2 pg (ref 25.1–34.0)
MCHC: 32.8 g/dL (ref 31.5–36.0)
MCV: 98.2 fL (ref 79.5–101.0)
MONO#: 0.4 10*3/uL (ref 0.1–0.9)
MONO%: 8 % (ref 0.0–14.0)
NEUT%: 73.3 % (ref 38.4–76.8)
NEUTROS ABS: 3.4 10*3/uL (ref 1.5–6.5)
PLATELETS: 167 10*3/uL (ref 145–400)
RBC: 3.82 10*6/uL (ref 3.70–5.45)
RDW: 13.4 % (ref 11.2–14.5)
WBC: 4.6 10*3/uL (ref 3.9–10.3)
lymph#: 0.6 10*3/uL — ABNORMAL LOW (ref 0.9–3.3)

## 2016-06-05 LAB — COMPREHENSIVE METABOLIC PANEL
ALK PHOS: 60 U/L (ref 40–150)
ALT: 11 U/L (ref 0–55)
AST: 17 U/L (ref 5–34)
Albumin: 3.1 g/dL — ABNORMAL LOW (ref 3.5–5.0)
Anion Gap: 8 mEq/L (ref 3–11)
BUN: 21.7 mg/dL (ref 7.0–26.0)
CHLORIDE: 105 meq/L (ref 98–109)
CO2: 26 meq/L (ref 22–29)
CREATININE: 1.7 mg/dL — AB (ref 0.6–1.1)
Calcium: 9.9 mg/dL (ref 8.4–10.4)
EGFR: 29 mL/min/{1.73_m2} — ABNORMAL LOW (ref >90)
GLUCOSE: 113 mg/dL (ref 70–140)
POTASSIUM: 4.2 meq/L (ref 3.5–5.1)
SODIUM: 140 meq/L (ref 136–145)
Total Bilirubin: 1.44 mg/dL — ABNORMAL HIGH (ref 0.20–1.20)
Total Protein: 6.3 g/dL — ABNORMAL LOW (ref 6.4–8.3)

## 2016-06-05 MED ORDER — HEPARIN SOD (PORK) LOCK FLUSH 100 UNIT/ML IV SOLN
500.0000 [IU] | Freq: Once | INTRAVENOUS | Status: AC
  Administered 2016-06-05: 500 [IU] via INTRAVENOUS
  Filled 2016-06-05: qty 5

## 2016-06-05 MED ORDER — SODIUM CHLORIDE 0.9% FLUSH
10.0000 mL | INTRAVENOUS | Status: DC | PRN
  Administered 2016-06-05: 10 mL via INTRAVENOUS
  Filled 2016-06-05: qty 10

## 2016-06-05 NOTE — Telephone Encounter (Signed)
Gave patient avs report and appointments for August.  °

## 2016-06-05 NOTE — Progress Notes (Signed)
[place] Telephone:(336) [phone]   Fax:(336) [phone]  OFFICE PROGRESS NOTE  [name], MD [address] [place] [address]  DIAGNOSIS: Recurrent non-small cell lung cancer, adenocarcinoma initially diagnosed as stage IIA in March 2008. The tumor cells were negative for EGFR mutation as well as ALK gene translocation.   PRIOR THERAPY:  1. Status post concurrent chemoradiation with weekly carboplatin and paclitaxel. Last dose was given Apr 12, 2007. 2. Status post 3 cycles of consolidation chemotherapy with docetaxel. Last dose was given July 12, 2007. 3. 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 4. Status post 39 cycles of maintenance Alimta at a dose of 500 mg/m2. Last dose was given February 25, 2010, discontinued secondary to persistent anemia, but the patient has stable disease. 5. Tarceva 150 mg by mouth daily. Status post approximately 37 months of therapy.    CURRENT THERAPY: Tarceva 100 mg by mouth daily. Status post 6 month of therapy.   CHEMOTHERAPY INTENT: Palliative  CURRENT # OF CHEMOTHERAPY CYCLES: 44 CURRENT ANTIEMETICS: Compazine when necessary  CURRENT SMOKING STATUS: Former smoker quit 11/27/2001  ORAL CHEMOTHERAPY AND CONSENT: Tarceva  CURRENT BISPHOSPHONATES USE: None  PAIN MANAGEMENT: 0/10  NARCOTICS INDUCED CONSTIPATION: None  LIVING WILL AND CODE STATUS: Full code initially but no prolonged resuscitation.   INTERVAL HISTORY: Diane [name] 75 y.o. female returns to the clinic today for routine monthly followup visit. The patient is feeling fine today with no specific complaints except for the occasional scalp scabs. She is tolerating her treatment with Tarceva 100 mg by mouth daily much better. She also has some itching but no significant diarrhea.  She has no significant fever or chills no nausea or vomiting. She has shortness of breath with exertion  with no cough or hemoptysis. She denied having any significant weight loss or night sweats. She has no nausea or vomiting.  She had repeat CBC and complaints metabolic panel performed earlier today and she is here for evaluation and discussion of her lab results.  MEDICAL HISTORY: Past Medical History  Diagnosis Date  . Hypertension   . Renal insufficiency     stage 3 renal disease/ pt  . lung ca dx'd 02/2010  . 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  MEDICATIONS:  Current Outpatient Prescriptions  Medication Sig Dispense Refill  . aspirin 81 MG tablet Take 81 mg by mouth daily.      . calcitRIOL (ROCALTROL) 0.25 MCG capsule Take 0.25 mcg by mouth every Monday, Wednesday, and Friday.    . erlotinib (TARCEVA) 100 MG tablet Take 1 tablet (100 mg total) by mouth daily. Take on an empty stomach 1 hour before meals or 2 hours after. 30 tablet 2  . labetalol (NORMODYNE) 200 MG tablet Take 200 mg by mouth 2 (two) times daily. Take 1 1/2 pills two times daily    . neomycin-polymyxin b-dexamethasone (MAXITROL) 3.5-10000-0.1 SUSP Place 1 drop into both eyes.     Marland Kitchen torsemide (DEMADEX) 20 MG tablet Take 20 mg by mouth daily.      . polyethylene glycol (MIRALAX / GLYCOLAX) packet Take 17 g by mouth daily. Reported on 06/05/2016     No current facility-administered medications for this visit.   Facility-Administered Medications Ordered in Other Visits  Medication Dose Route Frequency Provider Last Rate Last Dose  . sodium chloride 0.9 % injection  10 mL  10 mL Intravenous PRN Curt Bears, MD   10 mL at 10/14/13 1030    SURGICAL HISTORY:  Past Surgical History  Procedure Laterality Date  . Portacath placement  11/03/08-[facility]    tip in mid SVC-E. Mansell    REVIEW OF SYSTEMS:  A comprehensive review of systems was negative except for: Integument/breast: positive for pruritus and rash   PHYSICAL EXAMINATION: General appearance: alert,  cooperative and no distress Head: Normocephalic, without obvious abnormality, atraumatic Neck: no adenopathy, no carotid bruit, no JVD, supple, symmetrical, trachea midline and thyroid not enlarged, symmetric, no tenderness/mass/nodules Lymph nodes: Cervical, supraclavicular, and axillary nodes normal. Resp: clear to auscultation bilaterally and normal percussion bilaterally Back: symmetric, no curvature. ROM normal. No CVA tenderness. Cardio: regular rate and rhythm, S1, S2 normal, no murmur, click, rub or gallop GI: soft, non-tender; bowel sounds normal; no masses,  no organomegaly Extremities: extremities normal, atraumatic, no cyanosis or edema Neurologic: Alert and oriented X 3, normal strength and tone. Normal symmetric reflexes. Normal coordination and gait  ECOG PERFORMANCE STATUS: 1 - Symptomatic but completely ambulatory  Blood pressure 132/72, pulse 88, temperature 97.9 F (36.6 C), temperature source Oral, resp. rate 18, height _0  (1.651 m), weight 241 lb 8 oz (109.544 kg), SpO2 97 %.  LABORATORY DATA: Lab Results  Component Value Date   WBC 4.6 06/05/2016   HGB 12.3 06/05/2016   HCT 37.5 06/05/2016   MCV 98.2 06/05/2016   PLT 167 06/05/2016      Chemistry      Component Value Date/Time   NA 140 04/25/2016 0914   NA 137 11/14/2012 0816   NA 143 07/11/2012 0927   K 3.9 04/25/2016 0914   K 3.9 11/14/2012 0816   K 4.5 07/11/2012 0927   CL 102 05/08/2013 0851   CL 101 11/14/2012 0816   CL 101 07/11/2012 0927   CO2 28 04/25/2016 0914   CO2 28 11/14/2012 0816   CO2 29 07/11/2012 0927   BUN 21.9 04/25/2016 0914   BUN 17 11/14/2012 0816   BUN 16 07/11/2012 0927   CREATININE 1.5* 04/25/2016 0914   CREATININE 1.52* 11/14/2012 0816   CREATININE 1.5* 07/11/2012 0927      Component Value Date/Time   CALCIUM 9.6 04/25/2016 0914   CALCIUM 10.2 11/14/2012 0816   CALCIUM 10.0 07/11/2012 0927   ALKPHOS 58 04/25/2016 0914   ALKPHOS 74 11/14/2012 0816   ALKPHOS 71  07/11/2012 0927   AST 15 04/25/2016 0914   AST 16 11/14/2012 0816   AST 19 07/11/2012 0927   ALT 10 04/25/2016 0914   ALT 8 11/14/2012 0816   ALT 18 07/11/2012 0927   BILITOT 1.54* 04/25/2016 0914   BILITOT 1.2 11/14/2012 0816   BILITOT 0.80 07/11/2012 0927       RADIOGRAPHIC STUDIES: No results found.  ASSESSMENT AND PLAN: This is a very pleasant 75 years old white female with metastatic non-small cell lung cancer currently on treatment with Tarceva 150 mg by mouth daily status post 37 months of treatment and tolerating it fairly well except for dry skin and grade 1-2 skin rash on the face and fingers as well as mild paronychia.  She is currently on treatment with Tarceva 100 mg by mouth daily status post 6 months and tolerating it well.  CBC today is unremarkable. Complaints metabolic panel is still pending. I recommended for her to continue with her current treatment with Tarceva 100 mg by mouth daily. I would see her  back for follow-up visit in one month for reevaluation with repeat blood work. She was advised to call immediately if she has any concerning symptoms in the interval.  The patient voices understanding of current disease status and treatment options and is in agreement with the current care plan.  All questions were answered. The patient knows to call the clinic with any problems, questions or concerns. We can certainly see the patient much sooner if necessary.  Disclaimer: This note was dictated with voice recognition software. Similar sounding words can inadvertently be transcribed and may not be corrected upon review.

## 2016-07-04 ENCOUNTER — Other Ambulatory Visit: Payer: Medicare Other

## 2016-07-04 ENCOUNTER — Ambulatory Visit: Payer: Medicare Other | Admitting: Internal Medicine

## 2016-07-05 ENCOUNTER — Other Ambulatory Visit: Payer: Self-pay | Admitting: Medical Oncology

## 2016-07-05 DIAGNOSIS — C3490 Malignant neoplasm of unspecified part of unspecified bronchus or lung: Secondary | ICD-10-CM

## 2016-07-05 MED ORDER — ERLOTINIB HCL 100 MG PO TABS: 100.0000 mg | ORAL_TABLET | Freq: Every day | ORAL | 1 refills | Status: DC

## 2016-07-05 NOTE — Progress Notes (Signed)
Faxed tarceva refill to [facility]

## 2016-07-25 ENCOUNTER — Other Ambulatory Visit: Payer: Self-pay | Admitting: *Deleted

## 2016-07-25 DIAGNOSIS — C3490 Malignant neoplasm of unspecified part of unspecified bronchus or lung: Secondary | ICD-10-CM

## 2016-07-25 MED ORDER — ERLOTINIB HCL 100 MG PO TABS: 100.0000 mg | ORAL_TABLET | Freq: Every day | ORAL | 1 refills | Status: DC

## 2016-08-01 ENCOUNTER — Telehealth: Payer: Self-pay | Admitting: Internal Medicine

## 2016-08-01 ENCOUNTER — Ambulatory Visit (HOSPITAL_BASED_OUTPATIENT_CLINIC_OR_DEPARTMENT_OTHER): Payer: Medicare Other | Admitting: Internal Medicine

## 2016-08-01 ENCOUNTER — Ambulatory Visit: Payer: Medicare Other

## 2016-08-01 ENCOUNTER — Other Ambulatory Visit (HOSPITAL_BASED_OUTPATIENT_CLINIC_OR_DEPARTMENT_OTHER): Payer: Medicare Other

## 2016-08-01 ENCOUNTER — Encounter: Payer: Self-pay | Admitting: Internal Medicine

## 2016-08-01 VITALS — BP 132/64 | HR 90 | Temp 98.2°F | Resp 18 | Ht 65.0 in | Wt 235.0 lb

## 2016-08-01 DIAGNOSIS — R21 Rash and other nonspecific skin eruption: Secondary | ICD-10-CM

## 2016-08-01 DIAGNOSIS — C3491 Malignant neoplasm of unspecified part of right bronchus or lung: Secondary | ICD-10-CM

## 2016-08-01 DIAGNOSIS — D649 Anemia, unspecified: Secondary | ICD-10-CM

## 2016-08-01 DIAGNOSIS — N289 Disorder of kidney and ureter, unspecified: Secondary | ICD-10-CM | POA: Diagnosis not present

## 2016-08-01 DIAGNOSIS — Z5111 Encounter for antineoplastic chemotherapy: Secondary | ICD-10-CM

## 2016-08-01 DIAGNOSIS — Z95828 Presence of other vascular implants and grafts: Secondary | ICD-10-CM

## 2016-08-01 LAB — CBC WITH DIFFERENTIAL/PLATELET
BASO%: 0.9 % (ref 0.0–2.0)
BASOS ABS: 0 10*3/uL (ref 0.0–0.1)
EOS ABS: 0.3 10*3/uL (ref 0.0–0.5)
EOS%: 7.1 % — AB (ref 0.0–7.0)
HCT: 33.9 % — ABNORMAL LOW (ref 34.8–46.6)
HEMOGLOBIN: 11.4 g/dL — AB (ref 11.6–15.9)
LYMPH%: 12.4 % — AB (ref 14.0–49.7)
MCH: 31.8 pg (ref 25.1–34.0)
MCHC: 33.5 g/dL (ref 31.5–36.0)
MCV: 94.9 fL (ref 79.5–101.0)
MONO#: 0.5 10*3/uL (ref 0.1–0.9)
MONO%: 9.5 % (ref 0.0–14.0)
NEUT#: 3.5 10*3/uL (ref 1.5–6.5)
NEUT%: 70.1 % (ref 38.4–76.8)
Platelets: 277 10*3/uL (ref 145–400)
RBC: 3.57 10*6/uL — AB (ref 3.70–5.45)
RDW: 14 % (ref 11.2–14.5)
WBC: 4.9 10*3/uL (ref 3.9–10.3)
lymph#: 0.6 10*3/uL — ABNORMAL LOW (ref 0.9–3.3)

## 2016-08-01 LAB — COMPREHENSIVE METABOLIC PANEL
ALBUMIN: 2.5 g/dL — AB (ref 3.5–5.0)
ALK PHOS: 76 U/L (ref 40–150)
ALT: <9 U/L (ref 0–55)
AST: 13 U/L (ref 5–34)
Anion Gap: 8 mEq/L (ref 3–11)
BUN: 18.8 mg/dL (ref 7.0–26.0)
CHLORIDE: 105 meq/L (ref 98–109)
CO2: 28 mEq/L (ref 22–29)
Calcium: 9.8 mg/dL (ref 8.4–10.4)
Creatinine: 1.6 mg/dL — ABNORMAL HIGH (ref 0.6–1.1)
EGFR: 31 mL/min/{1.73_m2} — AB (ref >90)
GLUCOSE: 105 mg/dL (ref 70–140)
POTASSIUM: 4.4 meq/L (ref 3.5–5.1)
SODIUM: 141 meq/L (ref 136–145)
Total Bilirubin: 0.85 mg/dL (ref 0.20–1.20)
Total Protein: 6.2 g/dL — ABNORMAL LOW (ref 6.4–8.3)

## 2016-08-01 MED ORDER — HEPARIN SOD (PORK) LOCK FLUSH 100 UNIT/ML IV SOLN
500.0000 [IU] | Freq: Once | INTRAVENOUS | Status: AC | PRN
  Administered 2016-08-01: 500 [IU] via INTRAVENOUS
  Filled 2016-08-01: qty 5

## 2016-08-01 MED ORDER — SODIUM CHLORIDE 0.9 % IJ SOLN
10.0000 mL | INTRAMUSCULAR | Status: DC | PRN
  Administered 2016-08-01: 10 mL via INTRAVENOUS
  Filled 2016-08-01: qty 10

## 2016-08-01 NOTE — Telephone Encounter (Signed)
AVS REPORT AND APPT SCHD GIVEN PER 08/01/16 LOS.

## 2016-08-01 NOTE — Progress Notes (Signed)
[place] [phone]   Fax:(336) [phone]  OFFICE PROGRESS NOTE  [name], MD [address] [place] [address]  DIAGNOSIS: Recurrent non-small cell lung cancer, adenocarcinoma initially diagnosed as stage IIA in March 2008. The tumor cells were negative for EGFR mutation as well as ALK gene translocation.   PRIOR THERAPY:  1. Status post concurrent chemoradiation with weekly carboplatin and paclitaxel. Last dose was given Apr 12, 2007. 2. Status post 3 cycles of consolidation chemotherapy with docetaxel. Last dose was given July 12, 2007. 3. 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 4. Status post 39 cycles of maintenance Alimta at a dose of 500 mg/m2. Last dose was given February 25, 2010, discontinued secondary to persistent anemia, but the patient has stable disease. 5. Tarceva 150 mg by mouth daily. Status post approximately 37 months of therapy.    CURRENT THERAPY: Tarceva 100 mg by mouth daily. Status post 8 month of therapy.   CHEMOTHERAPY INTENT: Palliative  CURRENT # OF CHEMOTHERAPY CYCLES: 46 CURRENT ANTIEMETICS: Compazine when necessary  CURRENT SMOKING STATUS: Former smoker quit 11/27/2001  ORAL CHEMOTHERAPY AND CONSENT: Tarceva  CURRENT BISPHOSPHONATES USE: None  PAIN MANAGEMENT: 0/10  NARCOTICS INDUCED CONSTIPATION: None  LIVING WILL AND CODE STATUS: Full code initially but no prolonged resuscitation.   INTERVAL HISTORY: Diane [name] 75 y.o. female returns to the clinic today for routine monthly followup visit. The patient is feeling fine today with no specific complaints except for the scalp scabs. She is tolerating her treatment with Tarceva 100 mg by mouth daily much better. She also has some itching.  She has no significant fever or chills no nausea, vomiting or diarrhea. She has no chest pain, shortness of breath except with exertion with no  cough or hemoptysis. She denied having any significant weight loss or night sweats. She has no nausea or vomiting. She missed her appointment last month because of flu symptoms. She had repeat CBC and comprehensive metabolic panel performed earlier today and she is here for evaluation and discussion of her lab results.  MEDICAL HISTORY: Past Medical History:  Diagnosis Date  . Hypertension   . lung ca dx'd 02/2010  . 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 Renal insufficiency    stage 3 renal disease/ pt    ALLERGIES:  is allergic to septra ds [sulfamethoxazole-trimethoprim].  MEDICATIONS:  Current Outpatient Prescriptions  Medication Sig Dispense Refill  . aspirin 81 MG tablet Take 81 mg by mouth daily.      . calcitRIOL (ROCALTROL) 0.25 MCG capsule Take 0.25 mcg by mouth every Monday, Wednesday, and Friday.    . erlotinib (TARCEVA) 100 MG tablet Take 1 tablet (100 mg total) by mouth daily. Take on an empty stomach 1 hour before meals or 2 hours after. 30 tablet 1  . labetalol (NORMODYNE) 200 MG tablet Take 200 mg by mouth 2 (two) times daily. Take 1 1/2 pills two times daily    . neomycin-polymyxin b-dexamethasone (MAXITROL) 3.5-10000-0.1 SUSP Place 1 drop into both eyes.     . polyethylene glycol (MIRALAX / GLYCOLAX) packet Take 17 g by mouth daily. Reported on 06/05/2016    . torsemide (DEMADEX) 20 MG tablet Take 20 mg by mouth daily.       No current facility-administered medications for this visit.    Facility-Administered Medications Ordered in Other Visits  Medication Dose Route Frequency Provider Last  Rate Last Dose  . sodium chloride 0.9 % injection 10 mL  10 mL Intravenous PRN Curt Bears, MD   10 mL at 10/14/13 1030    SURGICAL HISTORY:  Past Surgical History:  Procedure Laterality Date  . PORTACATH PLACEMENT  11/03/08-[facility]   tip in mid SVC-E. Mansell    REVIEW OF SYSTEMS:  A comprehensive review of systems was negative except for:  Integument/breast: positive for pruritus and rash   PHYSICAL EXAMINATION: General appearance: alert, cooperative and no distress Head: Normocephalic, without obvious abnormality, atraumatic Neck: no adenopathy, no carotid bruit, no JVD, supple, symmetrical, trachea midline and thyroid not enlarged, symmetric, no tenderness/mass/nodules Lymph nodes: Cervical, supraclavicular, and axillary nodes normal. Resp: clear to auscultation bilaterally and normal percussion bilaterally Back: symmetric, no curvature. ROM normal. No CVA tenderness. Cardio: regular rate and rhythm, S1, S2 normal, no murmur, click, rub or gallop GI: soft, non-tender; bowel sounds normal; no masses,  no organomegaly Extremities: extremities normal, atraumatic, no cyanosis or edema Neurologic: Alert and oriented X 3, normal strength and tone. Normal symmetric reflexes. Normal coordination and gait  ECOG PERFORMANCE STATUS: 1 - Symptomatic but completely ambulatory  Blood pressure 132/64, pulse 90, temperature 98.2 F (36.8 C), temperature source Oral, resp. rate 18, height '5\' 5"'$  (1.651 m), weight 235 lb (106.6 kg), SpO2 96 %.  LABORATORY DATA: Lab Results  Component Value Date   WBC 4.9 31-May-202017   HGB 11.4 (L) 31-May-202017   HCT 33.9 (L) 31-May-202017   MCV 94.9 31-May-202017   PLT 277 31-May-202017      Chemistry      Component Value Date/Time   NA 140 06/05/2016 0906   K 4.2 06/05/2016 0906   CL 102 05/08/2013 0851   CO2 26 06/05/2016 0906   BUN 21.7 06/05/2016 0906   CREATININE 1.7 (H) 06/05/2016 0906      Component Value Date/Time   CALCIUM 9.9 06/05/2016 0906   ALKPHOS 60 06/05/2016 0906   AST 17 06/05/2016 0906   ALT 11 06/05/2016 0906   BILITOT 1.44 (H) 06/05/2016 0906       RADIOGRAPHIC STUDIES: No results found.  ASSESSMENT AND PLAN: This is a very pleasant 75 years old white female with metastatic non-small cell lung cancer currently on treatment with Tarceva 150 mg by mouth daily status post 37  months of treatment and tolerating it fairly well except for dry skin and grade 1-2 skin rash on the face and fingers as well as mild paronychia.  She is currently on treatment with Tarceva 100 mg by mouth daily status post 8 months and tolerating it well.  Her lab work is unremarkable today except for the mild anemia and mild renal insufficiency.  I recommended for her to continue with her current treatment with Tarceva 100 mg by mouth daily. I would see her back for follow-up visit in one month for reevaluation with repeat blood work as well as CT scan of the chest without contrast. She was advised to call immediately if she has any concerning symptoms in the interval.  The patient voices understanding of current disease status and treatment options and is in agreement with the current care plan.  All questions were answered. The patient knows to call the clinic with any problems, questions or concerns. We can certainly see the patient much sooner if necessary.  Disclaimer: This note was dictated with voice recognition software. Similar sounding words can inadvertently be transcribed and may not be corrected upon review.

## 2016-08-01 NOTE — Progress Notes (Signed)
Pt allowed blood to be drawn peripherally. Port flushed but had no blood return. Called Dr. [name] Nurse and let her know.

## 2016-08-31 ENCOUNTER — Encounter (HOSPITAL_COMMUNITY): Payer: Self-pay

## 2016-08-31 ENCOUNTER — Other Ambulatory Visit (HOSPITAL_BASED_OUTPATIENT_CLINIC_OR_DEPARTMENT_OTHER): Payer: Medicare Other

## 2016-08-31 ENCOUNTER — Ambulatory Visit (HOSPITAL_COMMUNITY)
Admission: RE | Admit: 2016-08-31 | Discharge: 2016-08-31 | Disposition: A | Discharge status: Home / Self Care | Payer: Medicare Other | Source: Ambulatory Visit | Attending: Internal Medicine | Admitting: Internal Medicine

## 2016-08-31 DIAGNOSIS — Z5111 Encounter for antineoplastic chemotherapy: Secondary | ICD-10-CM

## 2016-08-31 DIAGNOSIS — C3491 Malignant neoplasm of unspecified part of right bronchus or lung: Secondary | ICD-10-CM | POA: Insufficient documentation

## 2016-08-31 DIAGNOSIS — I700 Atherosclerosis of aorta: Secondary | ICD-10-CM | POA: Insufficient documentation

## 2016-08-31 DIAGNOSIS — I2510 Atherosclerotic heart disease of native coronary artery without angina pectoris: Secondary | ICD-10-CM | POA: Insufficient documentation

## 2016-08-31 DIAGNOSIS — J941 Fibrothorax: Secondary | ICD-10-CM | POA: Insufficient documentation

## 2016-08-31 LAB — CBC WITH DIFFERENTIAL/PLATELET
BASO%: 0.7 % (ref 0.0–2.0)
BASOS ABS: 0 10*3/uL (ref 0.0–0.1)
EOS ABS: 0.2 10*3/uL (ref 0.0–0.5)
EOS%: 4.9 % (ref 0.0–7.0)
HEMATOCRIT: 36.6 % (ref 34.8–46.6)
HGB: 12 g/dL (ref 11.6–15.9)
LYMPH#: 0.5 10*3/uL — AB (ref 0.9–3.3)
LYMPH%: 10.5 % — AB (ref 14.0–49.7)
MCH: 31.3 pg (ref 25.1–34.0)
MCHC: 32.8 g/dL (ref 31.5–36.0)
MCV: 95.4 fL (ref 79.5–101.0)
MONO#: 0.4 10*3/uL (ref 0.1–0.9)
MONO%: 8.6 % (ref 0.0–14.0)
NEUT#: 3.8 10*3/uL (ref 1.5–6.5)
NEUT%: 75.3 % (ref 38.4–76.8)
PLATELETS: 231 10*3/uL (ref 145–400)
RBC: 3.84 10*6/uL (ref 3.70–5.45)
RDW: 15.4 % — ABNORMAL HIGH (ref 11.2–14.5)
WBC: 5.1 10*3/uL (ref 3.9–10.3)

## 2016-08-31 LAB — COMPREHENSIVE METABOLIC PANEL
ANION GAP: 8 meq/L (ref 3–11)
AST: 14 U/L (ref 5–34)
Albumin: 2.9 g/dL — ABNORMAL LOW (ref 3.5–5.0)
Alkaline Phosphatase: 78 U/L (ref 40–150)
BILIRUBIN TOTAL: 1.59 mg/dL — AB (ref 0.20–1.20)
BUN: 18.9 mg/dL (ref 7.0–26.0)
CALCIUM: 10.2 mg/dL (ref 8.4–10.4)
CHLORIDE: 103 meq/L (ref 98–109)
CO2: 28 mEq/L (ref 22–29)
CREATININE: 1.5 mg/dL — AB (ref 0.6–1.1)
EGFR: 33 mL/min/{1.73_m2} — AB (ref >90)
Glucose: 108 mg/dl (ref 70–140)
Potassium: 4.6 mEq/L (ref 3.5–5.1)
Sodium: 139 mEq/L (ref 136–145)
Total Protein: 6.4 g/dL (ref 6.4–8.3)

## 2016-09-04 ENCOUNTER — Ambulatory Visit (HOSPITAL_BASED_OUTPATIENT_CLINIC_OR_DEPARTMENT_OTHER): Payer: Medicare Other | Admitting: Internal Medicine

## 2016-09-04 ENCOUNTER — Telehealth: Payer: Self-pay | Admitting: Internal Medicine

## 2016-09-04 ENCOUNTER — Encounter: Payer: Self-pay | Admitting: Internal Medicine

## 2016-09-04 VITALS — BP 113/52 | HR 87 | Temp 98.6°F | Resp 20 | Ht 65.0 in | Wt 237.5 lb

## 2016-09-04 DIAGNOSIS — L853 Xerosis cutis: Secondary | ICD-10-CM | POA: Diagnosis not present

## 2016-09-04 DIAGNOSIS — C3491 Malignant neoplasm of unspecified part of right bronchus or lung: Secondary | ICD-10-CM

## 2016-09-04 DIAGNOSIS — Z5111 Encounter for antineoplastic chemotherapy: Secondary | ICD-10-CM

## 2016-09-04 DIAGNOSIS — R21 Rash and other nonspecific skin eruption: Secondary | ICD-10-CM | POA: Diagnosis not present

## 2016-09-04 DIAGNOSIS — L03019 Cellulitis of unspecified finger: Secondary | ICD-10-CM | POA: Diagnosis not present

## 2016-09-04 NOTE — Progress Notes (Signed)
[place] [phone]   Fax:(336) [phone]  OFFICE PROGRESS NOTE  [name], MD [address] [place] [address]  DIAGNOSIS: Recurrent non-small cell lung cancer, adenocarcinoma initially diagnosed as stage IIA in March 2008. The tumor cells were negative for EGFR mutation as well as ALK gene translocation.   PRIOR THERAPY:  1. Status post concurrent chemoradiation with weekly carboplatin and paclitaxel. Last dose was given Apr 12, 2007. 2. Status post 3 cycles of consolidation chemotherapy with docetaxel. Last dose was given July 12, 2007. 3. 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 4. Status post 39 cycles of maintenance Alimta at a dose of 500 mg/m2. Last dose was given February 25, 2010, discontinued secondary to persistent anemia, but the patient has stable disease. 5. Tarceva 150 mg by mouth daily. Status post approximately 37 months of therapy.    CURRENT THERAPY: Tarceva 100 mg by mouth daily. Status post 9 month of therapy.   CHEMOTHERAPY INTENT: Palliative  CURRENT # OF CHEMOTHERAPY CYCLES: 47 CURRENT ANTIEMETICS: Compazine when necessary  CURRENT SMOKING STATUS: Former smoker quit 11/27/2001  ORAL CHEMOTHERAPY AND CONSENT: Tarceva  CURRENT BISPHOSPHONATES USE: None  PAIN MANAGEMENT: 0/10  NARCOTICS INDUCED CONSTIPATION: None  LIVING WILL AND CODE STATUS: Full code initially but no prolonged resuscitation.   INTERVAL HISTORY: Diane [name] 75 y.o. female returns to the clinic today for routine monthly followup visit. The patient is feeling fine today with no specific complaints except for dry skin. She is tolerating her treatment with Tarceva 100 mg by mouth daily much better. She also has some itching.  She has no significant fever or chills no nausea, vomiting or diarrhea. She has no chest pain, shortness of breath except with exertion with no cough or  hemoptysis. She denied having any significant weight loss or night sweats. She has no nausea or vomiting. She had repeat CT scan of the chest performed recently and she is here for evaluation and discussion of her scan results.  MEDICAL HISTORY: Past Medical History:  Diagnosis Date  . Hypertension   . lung ca dx'd 02/2010  . 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 Renal insufficiency    stage 3 renal disease/ pt    ALLERGIES:  is allergic to septra ds [sulfamethoxazole-trimethoprim].  MEDICATIONS:  Current Outpatient Prescriptions  Medication Sig Dispense Refill  . aspirin 81 MG tablet Take 81 mg by mouth daily.      . calcitRIOL (ROCALTROL) 0.25 MCG capsule Take 0.25 mcg by mouth every Monday, Wednesday, and Friday.    . erlotinib (TARCEVA) 100 MG tablet Take 1 tablet (100 mg total) by mouth daily. Take on an empty stomach 1 hour before meals or 2 hours after. 30 tablet 1  . labetalol (NORMODYNE) 200 MG tablet Take 200 mg by mouth 2 (two) times daily. Take 1 1/2 pills two times daily    . neomycin-polymyxin b-dexamethasone (MAXITROL) 3.5-10000-0.1 OINT     . polyethylene glycol (MIRALAX / GLYCOLAX) packet Take 17 g by mouth daily. Reported on 06/05/2016    . torsemide (DEMADEX) 20 MG tablet Take 20 mg by mouth daily.       No current facility-administered medications for this visit.    Facility-Administered Medications Ordered in Other Visits  Medication Dose Route Frequency Provider Last Rate Last Dose  . sodium chloride 0.9 % injection 10 mL  10 mL Intravenous PRN [name]  Bradee Common, MD   10 mL at 10/14/13 1030    SURGICAL HISTORY:  Past Surgical History:  Procedure Laterality Date  . PORTACATH PLACEMENT  11/03/08-[facility]   tip in mid SVC-E. Mansell    REVIEW OF SYSTEMS:  Constitutional: negative Eyes: negative Ears, nose, mouth, throat, and face: negative Respiratory: positive for dyspnea on exertion Cardiovascular: negative Gastrointestinal:  negative Genitourinary:negative Integument/breast: negative Hematologic/lymphatic: negative Musculoskeletal:negative Neurological: negative Behavioral/Psych: negative Endocrine: negative Allergic/Immunologic: negative   PHYSICAL EXAMINATION: General appearance: alert, cooperative and no distress Head: Normocephalic, without obvious abnormality, atraumatic Neck: no adenopathy, no carotid bruit, no JVD, supple, symmetrical, trachea midline and thyroid not enlarged, symmetric, no tenderness/mass/nodules Lymph nodes: Cervical, supraclavicular, and axillary nodes normal. Resp: clear to auscultation bilaterally and normal percussion bilaterally Back: symmetric, no curvature. ROM normal. No CVA tenderness. Cardio: regular rate and rhythm, S1, S2 normal, no murmur, click, rub or gallop GI: soft, non-tender; bowel sounds normal; no masses,  no organomegaly Extremities: extremities normal, atraumatic, no cyanosis or edema Neurologic: Alert and oriented X 3, normal strength and tone. Normal symmetric reflexes. Normal coordination and gait  ECOG PERFORMANCE STATUS: 1 - Symptomatic but completely ambulatory  Blood pressure (!) 113/52, pulse 87, temperature 98.6 F (37 C), temperature source Oral, resp. rate 20, height '5\' 5"'$  (1.651 m), weight 237 lb 8 oz (107.7 kg), SpO2 94 %.  LABORATORY DATA: Lab Results  Component Value Date   WBC 5.1 08/31/2016   HGB 12.0 08/31/2016   HCT 36.6 08/31/2016   MCV 95.4 08/31/2016   PLT 231 08/31/2016      Chemistry      Component Value Date/Time   NA 139 08/31/2016 0923   K 4.6 08/31/2016 0923   CL 102 05/08/2013 0851   CO2 28 08/31/2016 0923   BUN 18.9 08/31/2016 0923   CREATININE 1.5 (H) 08/31/2016 0923      Component Value Date/Time   CALCIUM 10.2 08/31/2016 0923   ALKPHOS 78 08/31/2016 0923   AST 14 08/31/2016 0923   ALT <9 08/31/2016 0923   BILITOT 1.59 (H) 08/31/2016 0923       RADIOGRAPHIC STUDIES: Ct Chest Wo Contrast  Result  Date: 08/31/2016 CLINICAL DATA:  Followup lung cancer EXAM: CT CHEST WITHOUT CONTRAST TECHNIQUE: Multidetector CT imaging of the chest was performed following the standard protocol without IV contrast. COMPARISON:  04/25/2016 FINDINGS: Cardiovascular: There is mild cardiac enlargement. Calcifications involving the thoracic aorta as well as the RCA, LAD and left circumflex coronary arteries noted. There is no mediastinal or hilar adenopathy. 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 This measures 13.2 x 7.6 x 6.5 cm. On the previous examination this measured 12.9 x 6.5 x 6.0 cm. Architectural distortion, scarring and ground-glass attenuation within the right lung is again noted compatible with changes of external beam radiation. Index ground-glass attenuating nodule within the right middle lobe measures 9 mm and is unchanged from previous exam, image 60 of series 4 5. There has been interval increase in multifocal patchy areas of peribronchovascular consolidation and nodularity within the right lung. Findings are favored to represent an inflammatory/infectious process versus progression of radiation change. Upper Abdomen: Normal appearance of the adrenal glands. The visualized portions of the liver and spleen are normal. The gallbladder is unremarkable. Musculoskeletal: There are no aggressive lytic or sclerotic bone lesions. Degenerative disc disease is noted within the thoracic spine. IMPRESSION: 1. Since the previous exam there has been increase in multifocal patchy areas of peribronchovascular consolidation nodularity within  the right lung. This is favored to reflect an inflammatory/infectious process. Progression of radiation change may also have this appearance. Recurrence of tumor within the right lung would be difficult to exclude with a high degree of certainty. 2. Stable to slight increase in size of chronic right-sided partially calcified  fibrothorax. 3. Aortic atherosclerosis and multi vessel coronary artery calcification. Electronically Signed   By: Kerby Moors M.D.   On: 08/31/2016 12:21    ASSESSMENT AND PLAN: This is a very pleasant 75 years old white female with metastatic non-small cell lung cancer currently on treatment with Tarceva 150 mg by mouth daily status post 37 months of treatment and tolerating it fairly well except for dry skin and grade 1-2 skin rash on the face and fingers as well as mild paronychia.  She is currently on treatment with Tarceva 100 mg by mouth daily status post 9 months and tolerating it well.  The recent CT scan of the chest showed no clear evidence for disease progression but there was some inflammatory changes. I discussed the scan results with the patient today. I recommended for her to continue on treatment with Tarceva with the same dose. I will see her back for follow-up visit in 6 weeks with repeat blood work. She will also continue Port-A-Cath flush every 6 weeks. She was advised to call immediately if she has any concerning symptoms in the interval.  The patient voices understanding of current disease status and treatment options and is in agreement with the current care plan.  All questions were answered. The patient knows to call the clinic with any problems, questions or concerns. We can certainly see the patient much sooner if necessary.  Disclaimer: This note was dictated with voice recognition software. Similar sounding words can inadvertently be transcribed and may not be corrected upon review.

## 2016-09-04 NOTE — Telephone Encounter (Signed)
Gave patient avs report and appointments for November.  °

## 2016-10-16 ENCOUNTER — Telehealth: Payer: Self-pay | Admitting: Internal Medicine

## 2016-10-16 ENCOUNTER — Ambulatory Visit (HOSPITAL_BASED_OUTPATIENT_CLINIC_OR_DEPARTMENT_OTHER): Payer: Medicare Other | Admitting: Internal Medicine

## 2016-10-16 ENCOUNTER — Ambulatory Visit: Payer: Medicare Other

## 2016-10-16 ENCOUNTER — Encounter: Payer: Self-pay | Admitting: Internal Medicine

## 2016-10-16 ENCOUNTER — Other Ambulatory Visit (HOSPITAL_BASED_OUTPATIENT_CLINIC_OR_DEPARTMENT_OTHER): Payer: Medicare Other

## 2016-10-16 VITALS — BP 139/68 | HR 84 | Temp 98.1°F | Resp 17 | Ht 60.5 in | Wt 237.4 lb

## 2016-10-16 DIAGNOSIS — J209 Acute bronchitis, unspecified: Secondary | ICD-10-CM | POA: Diagnosis not present

## 2016-10-16 DIAGNOSIS — Z5111 Encounter for antineoplastic chemotherapy: Secondary | ICD-10-CM

## 2016-10-16 DIAGNOSIS — C3491 Malignant neoplasm of unspecified part of right bronchus or lung: Secondary | ICD-10-CM

## 2016-10-16 DIAGNOSIS — Z95828 Presence of other vascular implants and grafts: Secondary | ICD-10-CM

## 2016-10-16 LAB — CBC WITH DIFFERENTIAL/PLATELET
BASO%: 0.8 % (ref 0.0–2.0)
Basophils Absolute: 0 10*3/uL (ref 0.0–0.1)
EOS%: 4.4 % (ref 0.0–7.0)
Eosinophils Absolute: 0.2 10*3/uL (ref 0.0–0.5)
HCT: 36.6 % (ref 34.8–46.6)
HGB: 12.1 g/dL (ref 11.6–15.9)
LYMPH%: 11 % — AB (ref 14.0–49.7)
MCH: 32 pg (ref 25.1–34.0)
MCHC: 33.1 g/dL (ref 31.5–36.0)
MCV: 96.8 fL (ref 79.5–101.0)
MONO#: 0.5 10*3/uL (ref 0.1–0.9)
MONO%: 10.2 % (ref 0.0–14.0)
NEUT#: 3.8 10*3/uL (ref 1.5–6.5)
NEUT%: 73.6 % (ref 38.4–76.8)
PLATELETS: 218 10*3/uL (ref 145–400)
RBC: 3.78 10*6/uL (ref 3.70–5.45)
RDW: 14.5 % (ref 11.2–14.5)
WBC: 5.1 10*3/uL (ref 3.9–10.3)
lymph#: 0.6 10*3/uL — ABNORMAL LOW (ref 0.9–3.3)

## 2016-10-16 LAB — COMPREHENSIVE METABOLIC PANEL
ALT: 11 U/L (ref 0–55)
ANION GAP: 7 meq/L (ref 3–11)
AST: 16 U/L (ref 5–34)
Albumin: 2.9 g/dL — ABNORMAL LOW (ref 3.5–5.0)
Alkaline Phosphatase: 74 U/L (ref 40–150)
BUN: 22.1 mg/dL (ref 7.0–26.0)
CHLORIDE: 105 meq/L (ref 98–109)
CO2: 26 meq/L (ref 22–29)
CREATININE: 1.6 mg/dL — AB (ref 0.6–1.1)
Calcium: 10 mg/dL (ref 8.4–10.4)
EGFR: 31 mL/min/{1.73_m2} — ABNORMAL LOW (ref >90)
GLUCOSE: 104 mg/dL (ref 70–140)
Potassium: 4.5 mEq/L (ref 3.5–5.1)
SODIUM: 139 meq/L (ref 136–145)
Total Bilirubin: 1.24 mg/dL — ABNORMAL HIGH (ref 0.20–1.20)
Total Protein: 6.4 g/dL (ref 6.4–8.3)

## 2016-10-16 MED ORDER — SODIUM CHLORIDE 0.9 % IJ SOLN
10.0000 mL | INTRAMUSCULAR | Status: DC | PRN
  Administered 2016-10-16: 10 mL via INTRAVENOUS
  Filled 2016-10-16: qty 10

## 2016-10-16 MED ORDER — HEPARIN SOD (PORK) LOCK FLUSH 100 UNIT/ML IV SOLN
500.0000 [IU] | Freq: Once | INTRAVENOUS | Status: AC | PRN
  Administered 2016-10-16: 500 [IU] via INTRAVENOUS
  Filled 2016-10-16: qty 5

## 2016-10-16 MED ORDER — LEVOFLOXACIN 500 MG PO TABS: 500.0000 mg | ORAL_TABLET | Freq: Every day | ORAL | 0 refills | Status: DC

## 2016-10-16 NOTE — Progress Notes (Signed)
[place] [phone]   Fax:(336) [phone]  OFFICE PROGRESS NOTE  [name], MD [address] [place] [address]  DIAGNOSIS: Recurrent non-small cell lung cancer, adenocarcinoma initially diagnosed as stage IIA in March 2008. The tumor cells were negative for EGFR mutation as well as ALK gene translocation.   PRIOR THERAPY:  1. Status post concurrent chemoradiation with weekly carboplatin and paclitaxel. Last dose was given Apr 12, 2007. 2. Status post 3 cycles of consolidation chemotherapy with docetaxel. Last dose was given July 12, 2007. 3. 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 4. Status post 39 cycles of maintenance Alimta at a dose of 500 mg/m2. Last dose was given February 25, 2010, discontinued secondary to persistent anemia, but the patient has stable disease. 5. Tarceva 150 mg by mouth daily. Status post approximately 37 months of therapy.    CURRENT THERAPY: Tarceva 100 mg by mouth daily. Status post 11 month of therapy.   CHEMOTHERAPY INTENT: Palliative  CURRENT # OF CHEMOTHERAPY CYCLES: 49 CURRENT ANTIEMETICS: Compazine when necessary  CURRENT SMOKING STATUS: Former smoker quit 11/27/2001  ORAL CHEMOTHERAPY AND CONSENT: Tarceva  CURRENT BISPHOSPHONATES USE: None  PAIN MANAGEMENT: 0/10  NARCOTICS INDUCED CONSTIPATION: None  LIVING WILL AND CODE STATUS: Full code initially but no prolonged resuscitation.   INTERVAL HISTORY: Diane [name] 75 y.o. female returns to the clinic today for routine monthly followup visit. The patient is feeling fine today with no specific complaints except for dry skin. She is tolerating her treatment with Tarceva 100 mg by mouth daily much better. She has intermittent cough recently productive of whitish blood-tinged sputum. She denied having any fever or chills. She has no nausea, vomiting or diarrhea. She has no chest  pain, shortness of breath except with exertion with no hemoptysis. She denied having any significant weight loss or night sweats. She has no nausea or vomiting. She is here today for reevaluation with repeat blood work.  MEDICAL HISTORY: Past Medical History:  Diagnosis Date  . Hypertension   . lung ca dx'd 02/2010  . 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 Renal insufficiency    stage 3 renal disease/ pt    ALLERGIES:  is allergic to septra ds [sulfamethoxazole-trimethoprim].  MEDICATIONS:  Current Outpatient Prescriptions  Medication Sig Dispense Refill  . aspirin 81 MG tablet Take 81 mg by mouth daily.      . calcitRIOL (ROCALTROL) 0.25 MCG capsule Take 0.25 mcg by mouth every Monday, Wednesday, and Friday.    . erlotinib (TARCEVA) 100 MG tablet Take 1 tablet (100 mg total) by mouth daily. Take on an empty stomach 1 hour before meals or 2 hours after. 30 tablet 1  . labetalol (NORMODYNE) 200 MG tablet Take 200 mg by mouth 2 (two) times daily. Take 1 1/2 pills two times daily    . neomycin-polymyxin b-dexamethasone (MAXITROL) 3.5-10000-0.1 OINT     . polyethylene glycol (MIRALAX / GLYCOLAX) packet Take 17 g by mouth daily. Reported on 06/05/2016    . torsemide (DEMADEX) 20 MG tablet Take 20 mg by mouth daily.       No current facility-administered medications for this visit.    Facility-Administered Medications Ordered in Other Visits  Medication Dose Route Frequency Provider Last Rate Last Dose  . sodium chloride 0.9 % injection 10 mL  10 mL Intravenous PRN Curt Bears, MD   10 mL at 10/14/13  1030  . sodium chloride 0.9 % injection 10 mL  10 mL Intravenous PRN Curt Bears, MD   10 mL at 10/16/16 0934    SURGICAL HISTORY:  Past Surgical History:  Procedure Laterality Date  . PORTACATH PLACEMENT  11/03/08-[facility]   tip in mid SVC-E. [facility]ell    REVIEW OF SYSTEMS:  A comprehensive review of systems was negative except for: Respiratory: positive for cough,  dyspnea on exertion and sputum   PHYSICAL EXAMINATION: General appearance: alert, cooperative and no distress Head: Normocephalic, without obvious abnormality, atraumatic Neck: no adenopathy, no carotid bruit, no JVD, supple, symmetrical, trachea midline and thyroid not enlarged, symmetric, no tenderness/mass/nodules Lymph nodes: Cervical, supraclavicular, and axillary nodes normal. Resp: clear to auscultation bilaterally and normal percussion bilaterally Back: symmetric, no curvature. ROM normal. No CVA tenderness. Cardio: regular rate and rhythm, S1, S2 normal, no murmur, click, rub or gallop GI: soft, non-tender; bowel sounds normal; no masses,  no organomegaly Extremities: extremities normal, atraumatic, no cyanosis or edema Neurologic: Alert and oriented X 3, normal strength and tone. Normal symmetric reflexes. Normal coordination and gait  ECOG PERFORMANCE STATUS: 1 - Symptomatic but completely ambulatory  Blood pressure 139/68, pulse 84, temperature 98.1 F (36.7 C), temperature source Oral, resp. rate 17, height 5' 0.5" (1.537 m), weight 237 lb 6.4 oz (107.7 kg), SpO2 98 %.  LABORATORY DATA: Lab Results  Component Value Date   WBC 5.1 10/16/2016   HGB 12.1 10/16/2016   HCT 36.6 10/16/2016   MCV 96.8 10/16/2016   PLT 218 10/16/2016      Chemistry      Component Value Date/Time   NA 139 08/31/2016 0923   K 4.6 08/31/2016 0923   CL 102 05/08/2013 0851   CO2 28 08/31/2016 0923   BUN 18.9 08/31/2016 0923   CREATININE 1.5 (H) 08/31/2016 0923      Component Value Date/Time   CALCIUM 10.2 08/31/2016 0923   ALKPHOS 78 08/31/2016 0923   AST 14 08/31/2016 0923   ALT <9 08/31/2016 0923   BILITOT 1.59 (H) 08/31/2016 0923       RADIOGRAPHIC STUDIES: No results found.  ASSESSMENT AND PLAN: This is a very pleasant 75 years old white female with metastatic non-small cell lung cancer currently on treatment with Tarceva 150 mg by mouth daily status post 37 months of treatment  and tolerating it fairly well except for dry skin and grade 1-2 skin rash on the face and fingers as well as mild paronychia.  She is currently on treatment with Tarceva 100 mg by mouth daily status post 11 months and tolerating it well.  I recommended for her to continue on treatment with Tarceva with the same dose. For the acute bronchitis, I will start the patient on Levaquin 500 mg by mouth daily for 7 days. I will see her back for follow-up visit in 6 weeks with repeat blood work and repeat CT scan of the chest without contrast. She will also continue Port-A-Cath flush every 6 weeks. She was advised to call immediately if she has any concerning symptoms in the interval.  The patient voices understanding of current disease status and treatment options and is in agreement with the current care plan.  All questions were answered. The patient knows to call the clinic with any problems, questions or concerns. We can certainly see the patient much sooner if necessary.  Disclaimer: This note was dictated with voice recognition software. Similar sounding words can inadvertently be transcribed and may not be  corrected upon review.

## 2016-10-16 NOTE — Patient Instructions (Signed)

## 2016-10-16 NOTE — Telephone Encounter (Signed)
Appointments scheduled per 11/20 LOS. Patient given AVS report and calendars with future scheduled appointments. °

## 2016-11-02 ENCOUNTER — Other Ambulatory Visit: Payer: Self-pay | Admitting: Medical Oncology

## 2016-11-02 ENCOUNTER — Telehealth: Payer: Self-pay | Admitting: Medical Oncology

## 2016-11-02 DIAGNOSIS — C3490 Malignant neoplasm of unspecified part of unspecified bronchus or lung: Secondary | ICD-10-CM

## 2016-11-02 MED ORDER — ERLOTINIB HCL 100 MG PO TABS: 100.0000 mg | ORAL_TABLET | Freq: Every day | ORAL | 1 refills | Status: DC

## 2016-11-02 NOTE — Telephone Encounter (Signed)
tarceva refilled 

## 2016-11-03 ENCOUNTER — Other Ambulatory Visit: Payer: Self-pay | Admitting: *Deleted

## 2016-11-03 NOTE — Telephone Encounter (Signed)
Faxed Rx to pt pharmacy

## 2016-12-04 ENCOUNTER — Ambulatory Visit (HOSPITAL_COMMUNITY)
Admission: RE | Admit: 2016-12-04 | Discharge: 2016-12-04 | Disposition: A | Discharge status: Home / Self Care | Payer: Medicare Other | Source: Ambulatory Visit | Attending: Internal Medicine | Admitting: Internal Medicine

## 2016-12-04 ENCOUNTER — Other Ambulatory Visit (HOSPITAL_BASED_OUTPATIENT_CLINIC_OR_DEPARTMENT_OTHER): Payer: Medicare Other

## 2016-12-04 ENCOUNTER — Ambulatory Visit (HOSPITAL_BASED_OUTPATIENT_CLINIC_OR_DEPARTMENT_OTHER): Payer: Medicare Other

## 2016-12-04 DIAGNOSIS — C3491 Malignant neoplasm of unspecified part of right bronchus or lung: Secondary | ICD-10-CM

## 2016-12-04 DIAGNOSIS — Z5111 Encounter for antineoplastic chemotherapy: Secondary | ICD-10-CM

## 2016-12-04 DIAGNOSIS — J209 Acute bronchitis, unspecified: Secondary | ICD-10-CM

## 2016-12-04 DIAGNOSIS — Z95828 Presence of other vascular implants and grafts: Secondary | ICD-10-CM

## 2016-12-04 LAB — CBC WITH DIFFERENTIAL/PLATELET
BASO%: 0.4 % (ref 0.0–2.0)
Basophils Absolute: 0 10*3/uL (ref 0.0–0.1)
EOS ABS: 0.3 10*3/uL (ref 0.0–0.5)
EOS%: 5.6 % (ref 0.0–7.0)
HCT: 35.6 % (ref 34.8–46.6)
HEMOGLOBIN: 11.7 g/dL (ref 11.6–15.9)
LYMPH%: 12.5 % — AB (ref 14.0–49.7)
MCH: 31.5 pg (ref 25.1–34.0)
MCHC: 32.9 g/dL (ref 31.5–36.0)
MCV: 96 fL (ref 79.5–101.0)
MONO#: 0.4 10*3/uL (ref 0.1–0.9)
MONO%: 8.6 % (ref 0.0–14.0)
NEUT%: 72.9 % (ref 38.4–76.8)
NEUTROS ABS: 3.4 10*3/uL (ref 1.5–6.5)
Platelets: 172 10*3/uL (ref 145–400)
RBC: 3.71 10*6/uL (ref 3.70–5.45)
RDW: 14.1 % (ref 11.2–14.5)
WBC: 4.6 10*3/uL (ref 3.9–10.3)
lymph#: 0.6 10*3/uL — ABNORMAL LOW (ref 0.9–3.3)

## 2016-12-04 LAB — COMPREHENSIVE METABOLIC PANEL
ALBUMIN: 3.1 g/dL — AB (ref 3.5–5.0)
ALK PHOS: 72 U/L (ref 40–150)
ALT: 8 U/L (ref 0–55)
AST: 14 U/L (ref 5–34)
Anion Gap: 8 mEq/L (ref 3–11)
BILIRUBIN TOTAL: 1.37 mg/dL — AB (ref 0.20–1.20)
BUN: 18.7 mg/dL (ref 7.0–26.0)
CO2: 26 meq/L (ref 22–29)
CREATININE: 1.6 mg/dL — AB (ref 0.6–1.1)
Calcium: 9.7 mg/dL (ref 8.4–10.4)
Chloride: 105 mEq/L (ref 98–109)
EGFR: 32 mL/min/{1.73_m2} — AB (ref >90)
GLUCOSE: 99 mg/dL (ref 70–140)
Potassium: 4.2 mEq/L (ref 3.5–5.1)
SODIUM: 140 meq/L (ref 136–145)
Total Protein: 6 g/dL — ABNORMAL LOW (ref 6.4–8.3)

## 2016-12-04 MED ORDER — SODIUM CHLORIDE 0.9 % IJ SOLN
10.0000 mL | INTRAMUSCULAR | Status: DC | PRN
  Administered 2016-12-04: 10 mL via INTRAVENOUS
  Filled 2016-12-04: qty 10

## 2016-12-04 MED ORDER — HEPARIN SOD (PORK) LOCK FLUSH 100 UNIT/ML IV SOLN
500.0000 [IU] | Freq: Once | INTRAVENOUS | Status: AC | PRN
  Administered 2016-12-04: 500 [IU] via INTRAVENOUS
  Filled 2016-12-04: qty 5

## 2016-12-11 ENCOUNTER — Encounter: Payer: Self-pay | Admitting: Internal Medicine

## 2016-12-11 ENCOUNTER — Telehealth: Payer: Self-pay | Admitting: Internal Medicine

## 2016-12-11 ENCOUNTER — Ambulatory Visit (HOSPITAL_BASED_OUTPATIENT_CLINIC_OR_DEPARTMENT_OTHER): Payer: Medicare Other | Admitting: Internal Medicine

## 2016-12-11 VITALS — BP 111/50 | HR 94 | Temp 98.7°F | Resp 16 | Ht 60.5 in | Wt 230.6 lb

## 2016-12-11 DIAGNOSIS — J209 Acute bronchitis, unspecified: Secondary | ICD-10-CM

## 2016-12-11 DIAGNOSIS — Z5111 Encounter for antineoplastic chemotherapy: Secondary | ICD-10-CM

## 2016-12-11 DIAGNOSIS — I10 Essential (primary) hypertension: Secondary | ICD-10-CM

## 2016-12-11 DIAGNOSIS — C3491 Malignant neoplasm of unspecified part of right bronchus or lung: Secondary | ICD-10-CM | POA: Diagnosis not present

## 2016-12-11 NOTE — Telephone Encounter (Signed)
Appointments scheduled per 12/11/16 los. Patient was given a copy of the AVS report and appointment schedule, per 12/11/16 los.

## 2016-12-11 NOTE — Progress Notes (Signed)
[name] Telephone:([phone]   Fax:(336) [phone]  OFFICE PROGRESS NOTE  [name], MD [place] [address]  DIAGNOSIS: Recurrent non-small cell lung cancer, adenocarcinoma with negative EGFR and ALK mutations initially diagnosed as a stage IIA in March 2008.  PRIOR THERAPY: 1. Status post concurrent chemoradiation with weekly carboplatin and paclitaxel. Last dose was given Apr 12, 2007. 2. Status post 3 cycles of consolidation chemotherapy with docetaxel. Last dose was given July 12, 2007. 3. 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 4. Status post 39 cycles of maintenance Alimta at a dose of 500 mg/m2. Last dose was given February 25, 2010, discontinued secondary to persistent anemia, but the patient has stable disease. 5. Tarceva 150 mg by mouth daily. Status post approximately 37 months of therapy.   CURRENT THERAPY: Tarceva 100 mg by mouth daily, status post 13 months of treatment.  INTERVAL HISTORY: Diane [name] 76 y.o. female returns to the clinic today for follow-up visit accompanied by her husband. The patient has nasal congestion and probably flulike symptoms started recently. She denied having any fever or chills. She has mild dry cough. She has shortness of breath with exertion but no significant chest pain or hemoptysis. She lost around 7 pounds since her last visit 2 months ago. She denied having any headache or visual changes. She has been tolerating her treatment with Tarceva fairly well was no significant adverse effects. She denied having any concerning skin rash or diarrhea. She had repeat CT scan of the chest performed recently and she is here for evaluation and discussion of her scan results.  MEDICAL HISTORY: Past Medical History:  Diagnosis Date  . Acute bronchitis 10/16/2016  . Hypertension   . lung ca dx'd 02/2010  . Parotid gland  adenocarcinoma ([place]) dx'd 41+ yrs ago  . Renal insufficiency    stage 3 renal disease/ pt    ALLERGIES:  is allergic to septra ds [sulfamethoxazole-trimethoprim].  MEDICATIONS:  Current Outpatient Prescriptions  Medication Sig Dispense Refill  . aspirin 81 MG tablet Take 81 mg by mouth daily.      . calcitRIOL (ROCALTROL) 0.25 MCG capsule Take 0.25 mcg by mouth every Monday, Wednesday, and Friday.    . erlotinib (TARCEVA) 100 MG tablet Take 1 tablet (100 mg total) by mouth daily. Take on an empty stomach 1 hour before meals or 2 hours after. 30 tablet 1  . labetalol (NORMODYNE) 200 MG tablet Take 200 mg by mouth 2 (two) times daily. Take 1 1/2 pills two times daily    . neomycin-polymyxin b-dexamethasone (MAXITROL) 3.5-10000-0.1 OINT     . polyethylene glycol (MIRALAX / GLYCOLAX) packet Take 17 g by mouth daily. Reported on 06/05/2016    . torsemide (DEMADEX) 20 MG tablet Take 20 mg by mouth daily.       No current facility-administered medications for this visit.    Facility-Administered Medications Ordered in Other Visits  Medication Dose Route Frequency Provider Last Rate Last Dose  . sodium chloride 0.9 % injection 10 mL  10 mL Intravenous PRN Curt Bears, MD   10 mL at 10/14/13 1030    SURGICAL HISTORY:  Past Surgical History:  Procedure Laterality Date  . PORTACATH PLACEMENT  11/03/08-[facility]   tip in mid SVC-E. [facility]    REVIEW OF SYSTEMS:  Constitutional: positive for fatigue and weight loss Eyes: negative Ears, nose, mouth, throat, and face: positive for  nasal congestion Respiratory: positive for cough and dyspnea on exertion Cardiovascular: negative Gastrointestinal: negative Genitourinary:negative Integument/breast: negative Hematologic/lymphatic: negative Musculoskeletal:negative Neurological: negative Behavioral/Psych: negative Endocrine: negative Allergic/Immunologic: negative   PHYSICAL EXAMINATION: General appearance: alert, cooperative, fatigued and  no distress Head: Normocephalic, without obvious abnormality, atraumatic Neck: no adenopathy, no JVD, supple, symmetrical, trachea midline and thyroid not enlarged, symmetric, no tenderness/mass/nodules Lymph nodes: Cervical, supraclavicular, and axillary nodes normal. Resp: diminished breath sounds RLL and dullness to percussion RLL Back: symmetric, no curvature. ROM normal. No CVA tenderness. Cardio: regular rate and rhythm, S1, S2 normal, no murmur, click, rub or gallop GI: soft, non-tender; bowel sounds normal; no masses,  no organomegaly Extremities: extremities normal, atraumatic, no cyanosis or edema Neurologic: Alert and oriented X 3, normal strength and tone. Normal symmetric reflexes. Normal coordination and gait  ECOG PERFORMANCE STATUS: 1 - Symptomatic but completely ambulatory  Blood pressure (!) 111/50, pulse 94, temperature 98.7 F (37.1 C), temperature source Oral, resp. rate 16, height 5' 0.5" (1.537 m), weight 230 lb 9.6 oz (104.6 kg), SpO2 92 %.  LABORATORY DATA: Lab Results  Component Value Date   WBC 4.6 12/04/2016   HGB 11.7 12/04/2016   HCT 35.6 12/04/2016   MCV 96.0 12/04/2016   PLT 172 12/04/2016      Chemistry      Component Value Date/Time   NA 140 12/04/2016 0853   K 4.2 12/04/2016 0853   CL 102 05/08/2013 0851   CO2 26 12/04/2016 0853   BUN 18.7 12/04/2016 0853   CREATININE 1.6 (H) 12/04/2016 0853      Component Value Date/Time   CALCIUM 9.7 12/04/2016 0853   ALKPHOS 72 12/04/2016 0853   AST 14 12/04/2016 0853   ALT 8 12/04/2016 0853   BILITOT 1.37 (H) 12/04/2016 0853       RADIOGRAPHIC STUDIES: Ct Chest Wo Contrast  Result Date: 12/04/2016 CLINICAL DATA:  Restaging lung cancer. EXAM: CT CHEST WITHOUT CONTRAST TECHNIQUE: Multidetector CT imaging of the chest was performed following the standard protocol without IV contrast. COMPARISON:  08/31/2016 and multiple prior chest CTs. FINDINGS: Chest wall: No breast masses, supraclavicular or  axillary lymphadenopathy. Small cluster of left supraclavicular lymph nodes are stable. The thyroid gland is grossly normal. The left-sided Port-A-Cath is stable. Cardiovascular: The heart is mildly enlarged but stable. Small amount of pericardial fluid but no overt pericardial effusion. Stable tortuosity, ectasia and atherosclerotic calcifications involving the thoracic aorta. Stable three-vessel coronary artery calcifications. Mediastinum/Nodes: Small scattered mediastinal and hilar lymph nodes appears stable. No mass or overt adenopathy. The esophagus is grossly normal. Lungs/Pleura: Stable extensive radiation changes involving the right paramediastinal lung. Stable nodularity and scarring in the right middle. 6 mm right apical density is unchanged. Other small nodules are stable. Interval progressive soft tissue thickening in the right lower lobe in the as ago esophageal recess area. Although this could be further consolidation from radiation I do not see any bronchograms in this area. Maximum diameter is 33 mm on image number 73 and was previously 25 mm. On image number 77 the maximum diameter is 43 mm and was previously 34 mm. Could not exclude recurrent tumor. PET-CT may be helpful for further evaluation. Stable loculated and partially calcified right basilar pleural fluid collection. Upper Abdomen: No significant upper abdominal findings. Musculoskeletal: No significant bony findings. Stable advanced degenerative changes involving the thoracic spine. IMPRESSION: 1. Increasing soft tissue density in the right lower lobe. This could be further radiation consolidation but could not exclude recurrent tumor.  PET-CT may be helpful for further evaluation. 2. Extensive radiation changes involving the right lung. 3. No mediastinal or hilar adenopathy. Stable small cluster of left supraclavicular lymph nodes. 4. No findings for upper abdominal metastatic disease. 5. Stable large loculated complex fluid collection at  the right lung base. Electronically Signed   By: Marijo Sanes M.D.   On: 12/04/2016 13:21    ASSESSMENT AND PLAN:  This is a very pleasant 76 years old white female with recurrent non-small cell lung cancer, adenocarcinoma diagnosed in March 2008 status post several chemotherapy regimens. She is currently on treatment with Tarceva 100 mg by mouth daily status post 13 months. She is tolerating the treatment well with no significant adverse effects. She had repeat CT scan of the chest performed recently. I personally and independently reviewed the scan images and discuss the results and showed the images to the patient and her husband today. Unfortunately the scan showed further increase in the soft tissue density in the right lower lobe concerning for disease progression. I recommended for the patient to have a PET scan performed for further evaluation of this lesion. For now she will continue on treatment with Tarceva with the same dose. If the PET scan showed evidence for disease progression, I may consider the patient for other treatment options including immunotherapy. For the nasal congestion, the patient will continue with over-the-counter cold symptoms medications. For hypertension, she will continue on labetalol and Demadex as prescribed by her primary care physician. The patient was advised to call immediately if she has any concerning symptoms in the interval. The patient voices understanding of current disease status and treatment options and is in agreement with the current care plan.  All questions were answered. The patient knows to call the clinic with any problems, questions or concerns. We can certainly see the patient much sooner if necessary.  I spent 15 minutes counseling the patient face to face. The total time spent in the appointment was 25 minutes.  Disclaimer: This note was dictated with voice recognition software. Similar sounding words can inadvertently be transcribed  and may not be corrected upon review.

## 2016-12-25 ENCOUNTER — Ambulatory Visit (HOSPITAL_COMMUNITY)
Admission: RE | Admit: 2016-12-25 | Discharge: 2016-12-25 | Disposition: A | Discharge status: Home / Self Care | Payer: Medicare Other | Source: Ambulatory Visit | Attending: Internal Medicine | Admitting: Internal Medicine

## 2016-12-25 DIAGNOSIS — J209 Acute bronchitis, unspecified: Secondary | ICD-10-CM

## 2016-12-25 DIAGNOSIS — Z5111 Encounter for antineoplastic chemotherapy: Secondary | ICD-10-CM

## 2016-12-25 DIAGNOSIS — I700 Atherosclerosis of aorta: Secondary | ICD-10-CM | POA: Diagnosis not present

## 2016-12-25 DIAGNOSIS — C3491 Malignant neoplasm of unspecified part of right bronchus or lung: Secondary | ICD-10-CM | POA: Diagnosis present

## 2016-12-25 DIAGNOSIS — J90 Pleural effusion, not elsewhere classified: Secondary | ICD-10-CM | POA: Insufficient documentation

## 2016-12-25 DIAGNOSIS — I2510 Atherosclerotic heart disease of native coronary artery without angina pectoris: Secondary | ICD-10-CM | POA: Diagnosis not present

## 2016-12-25 LAB — GLUCOSE, CAPILLARY: Glucose-Capillary: 110 mg/dL — ABNORMAL HIGH (ref 65–99)

## 2016-12-25 MED ORDER — FLUDEOXYGLUCOSE F - 18 (FDG) INJECTION
13.7000 | Freq: Once | INTRAVENOUS | Status: AC | PRN
  Administered 2016-12-25: 13.7 via INTRAVENOUS

## 2016-12-26 ENCOUNTER — Ambulatory Visit (HOSPITAL_BASED_OUTPATIENT_CLINIC_OR_DEPARTMENT_OTHER): Payer: Medicare Other | Admitting: Internal Medicine

## 2016-12-26 ENCOUNTER — Encounter: Payer: Self-pay | Admitting: Internal Medicine

## 2016-12-26 ENCOUNTER — Telehealth: Payer: Self-pay | Admitting: Internal Medicine

## 2016-12-26 VITALS — BP 135/69 | HR 96 | Temp 98.4°F | Resp 17 | Wt 236.6 lb

## 2016-12-26 DIAGNOSIS — R0602 Shortness of breath: Secondary | ICD-10-CM

## 2016-12-26 DIAGNOSIS — C3491 Malignant neoplasm of unspecified part of right bronchus or lung: Secondary | ICD-10-CM | POA: Diagnosis not present

## 2016-12-26 NOTE — Telephone Encounter (Signed)
Appointments scheduled per 1/30 LOS. Patient given AVS report and calendars with future scheduled appointments.

## 2016-12-26 NOTE — Progress Notes (Signed)
[facility] [phone]   [phone]  OFFICE PROGRESS NOTE  [name], MD [place] [address]  DIAGNOSIS: Recurrent non-small cell lung cancer, adenocarcinoma with negative EGFR and ALK mutations initially diagnosed as a stage IIA in March 2008.  PRIOR THERAPY: 1. Status post concurrent chemoradiation with weekly carboplatin and paclitaxel. Last dose was given Apr 12, 2007. 2. Status post 3 cycles of consolidation chemotherapy with docetaxel. Last dose was given July 12, 2007. 3. 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 4. Status post 39 cycles of maintenance Alimta at a dose of 500 mg/m2. Last dose was given February 25, 2010, discontinued secondary to persistent anemia, but the patient has stable disease. 5. Tarceva 150 mg by mouth daily. Status post approximately 37 months of therapy.   CURRENT THERAPY: Tarceva 100 mg by mouth daily, status post 13 months of treatment.  INTERVAL HISTORY: Diane [name] 76 y.o. female came to the clinic today for follow-up visit. The patient is feeling fine today with no specific complaints except for the persistent baseline shortness of breath increased with exertion. Her oxygen saturation today was 93% on room air. She denied having any chest pain, cough or hemoptysis. She denied having any significant weight loss or night sweats. She has no nausea or vomiting. She was found to have some concerning findings on the previous CT scan of the chest and the patient had repeat PET scan performed recently and she is here for evaluation and discussion of her scan results and treatment options. She was unable to renew her treatment with Tarceva because of lack of financial assistant and finds.    MEDICAL HISTORY: Past Medical History:  Diagnosis Date  . Acute bronchitis 10/16/2016  . Hypertension   . lung ca dx'd 02/2010  .  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 Renal insufficiency    stage 3 renal disease/ pt    ALLERGIES:  is allergic to septra ds [sulfamethoxazole-trimethoprim].  MEDICATIONS:  Current Outpatient Prescriptions  Medication Sig Dispense Refill  . aspirin 81 MG tablet Take 81 mg by mouth daily.      . calcitRIOL (ROCALTROL) 0.25 MCG capsule Take 0.25 mcg by mouth every Monday, Wednesday, and Friday.    . erlotinib (TARCEVA) 100 MG tablet Take 1 tablet (100 mg total) by mouth daily. Take on an empty stomach 1 hour before meals or 2 hours after. 30 tablet 1  . labetalol (NORMODYNE) 200 MG tablet Take 200 mg by mouth 2 (two) times daily. Take 1 1/2 pills two times daily    . neomycin-polymyxin b-dexamethasone (MAXITROL) 3.5-10000-0.1 OINT     . polyethylene glycol (MIRALAX / GLYCOLAX) packet Take 17 g by mouth daily. Reported on 06/05/2016    . torsemide (DEMADEX) 20 MG tablet Take 20 mg by mouth daily.       No current facility-administered medications for this visit.    Facility-Administered Medications Ordered in Other Visits  Medication Dose Route Frequency Provider Last Rate Last Dose  . sodium chloride 0.9 % injection 10 mL  10 mL Intravenous PRN Curt Bears, MD   10 mL at 10/14/13 1030    SURGICAL HISTORY:  Past Surgical History:  Procedure Laterality Date  . PORTACATH PLACEMENT  11/03/08-[facility]   tip in mid SVC-E. Mansell    REVIEW OF SYSTEMS:  A comprehensive review of systems was negative except for: Constitutional:  positive for fatigue Respiratory: positive for dyspnea on exertion   PHYSICAL EXAMINATION: General appearance: alert, cooperative, fatigued and no distress Head: Normocephalic, without obvious abnormality, atraumatic Neck: no adenopathy, no JVD, supple, symmetrical, trachea midline and thyroid not enlarged, symmetric, no tenderness/mass/nodules Lymph nodes: Cervical, supraclavicular, and axillary nodes normal. Resp: clear to auscultation  bilaterally Back: symmetric, no curvature. ROM normal. No CVA tenderness. Cardio: regular rate and rhythm, S1, S2 normal, no murmur, click, rub or gallop GI: soft, non-tender; bowel sounds normal; no masses,  no organomegaly Extremities: extremities normal, atraumatic, no cyanosis or edema  ECOG PERFORMANCE STATUS: 1 - Symptomatic but completely ambulatory  Blood pressure 135/69, pulse 96, temperature 98.4 F (36.9 C), temperature source Oral, resp. rate 17, weight 236 lb 9.6 oz (107.3 kg), SpO2 93 %.  LABORATORY DATA: Lab Results  Component Value Date   WBC 4.6 12/04/2016   HGB 11.7 12/04/2016   HCT 35.6 12/04/2016   MCV 96.0 12/04/2016   PLT 172 12/04/2016      Chemistry      Component Value Date/Time   NA 140 12/04/2016 0853   K 4.2 12/04/2016 0853   CL 102 05/08/2013 0851   CO2 26 12/04/2016 0853   BUN 18.7 12/04/2016 0853   CREATININE 1.6 (H) 12/04/2016 0853      Component Value Date/Time   CALCIUM 9.7 12/04/2016 0853   ALKPHOS 72 12/04/2016 0853   AST 14 12/04/2016 0853   ALT 8 12/04/2016 0853   BILITOT 1.37 (H) 12/04/2016 0853       RADIOGRAPHIC STUDIES: Ct Chest Wo Contrast  Result Date: 12/04/2016 CLINICAL DATA:  Restaging lung cancer. EXAM: CT CHEST WITHOUT CONTRAST TECHNIQUE: Multidetector CT imaging of the chest was performed following the standard protocol without IV contrast. COMPARISON:  08/31/2016 and multiple prior chest CTs. FINDINGS: Chest wall: No breast masses, supraclavicular or axillary lymphadenopathy. Small cluster of left supraclavicular lymph nodes are stable. The thyroid gland is grossly normal. The left-sided Port-A-Cath is stable. Cardiovascular: The heart is mildly enlarged but stable. Small amount of pericardial fluid but no overt pericardial effusion. Stable tortuosity, ectasia and atherosclerotic calcifications involving the thoracic aorta. Stable three-vessel coronary artery calcifications. Mediastinum/Nodes: Small scattered mediastinal  and hilar lymph nodes appears stable. No mass or overt adenopathy. The esophagus is grossly normal. Lungs/Pleura: Stable extensive radiation changes involving the right paramediastinal lung. Stable nodularity and scarring in the right middle. 6 mm right apical density is unchanged. Other small nodules are stable. Interval progressive soft tissue thickening in the right lower lobe in the as ago esophageal recess area. Although this could be further consolidation from radiation I do not see any bronchograms in this area. Maximum diameter is 33 mm on image number 73 and was previously 25 mm. On image number 77 the maximum diameter is 43 mm and was previously 34 mm. Could not exclude recurrent tumor. PET-CT may be helpful for further evaluation. Stable loculated and partially calcified right basilar pleural fluid collection. Upper Abdomen: No significant upper abdominal findings. Musculoskeletal: No significant bony findings. Stable advanced degenerative changes involving the thoracic spine. IMPRESSION: 1. Increasing soft tissue density in the right lower lobe. This could be further radiation consolidation but could not exclude recurrent tumor. PET-CT may be helpful for further evaluation. 2. Extensive radiation changes involving the right lung. 3. No mediastinal or hilar adenopathy. Stable small cluster of left supraclavicular lymph nodes. 4. No findings for upper abdominal metastatic disease. 5. Stable large loculated complex fluid collection at the right  lung base. Electronically Signed   By: Marijo Sanes M.D.   On: 12/04/2016 13:21   Nm Pet Image Restag (ps) Skull Base To Thigh  Result Date: 12/25/2016 CLINICAL DATA:  Subsequent treatment strategy for right-sided lung cancer. Restaging. EXAM: NUCLEAR MEDICINE PET SKULL BASE TO THIGH TECHNIQUE: 13.7 mCi F-18 FDG was injected intravenously. Full-ring PET imaging was performed from the skull base to thigh after the radiotracer. CT data was obtained and used for  attenuation correction and anatomic localization. FASTING BLOOD GLUCOSE:  Value: 114 mg/dl COMPARISON:  Multiple chest CTs, most recent 12/04/2016. abdominopelvic CT of 11/11/2015. FINDINGS: Mild degradation secondary to patient body habitus. NECK No cervical nodal hypermetabolism. Mucosal thickening of bilateral maxillary sinuses. CHEST No thoracic nodal hypermetabolism. A focus of hypermetabolism within the posterior mediastinum is favored to be vascular, including on image 53/series 4. There is moderate, relatively diffuse hypermetabolism corresponding to central right lower lobe soft tissue density detailed on the prior CT. This measures a S.U.V. max of 5.5 including on image 63/series 4. Somewhat more focal area of hypermetabolism about the most anterior portion of this consolidation measures a S.U.V. max of 6.1, including on image 66/series 4. This area of masslike consolidation is similar in CT appearance to on the 12/04/2016 CT. Mild cardiomegaly with multivessel coronary artery atherosclerosis. A left Port-A-Cath which terminates at the high SVC. Loculated chronic right-sided pleural effusion with pleural thickening and calcifications. Probable right apical scarring. Moderate centrilobular emphysema. ABDOMEN/PELVIS No abdominopelvic nodal or parenchymal hypermetabolism. Left greater than right renal cortical thinning. Normal adrenal glands. Hepatomegaly. Colonic stool burden suggests constipation. Abdominal aortic atherosclerosis. SKELETON No abnormal marrow activity. No focal osseous lesion. IMPRESSION: 1. Mild degradation secondary to patient body habitus. 2. Central right lower lobe masslike consolidation demonstrates moderate, relatively diffuse hypermetabolism. This pattern favors evolving radiation change. Residual/recurrent disease cannot be excluded. Potential clinical strategies include CT followup at 3 months or if there is a strong clinical concern of recurrent disease, bronchoscopy with  attention to the right lower lobe. 3. No evidence of extrathoracic disease. 4. Chronic right-sided pleural effusion. 5.  Coronary artery atherosclerosis. Aortic atherosclerosis. 6.  Possible constipation. Electronically Signed   By: Abigail Miyamoto M.D.   On: 12/25/2016 16:54    ASSESSMENT AND PLAN:  This is a very pleasant 76 years old white female with recurrent non-small cell lung cancer, adenocarcinoma diagnosed in March 2008. The patient is status post several systemic chemotherapy regimens and most recently was treated with Tarceva 100 mg by mouth daily status post 13 months of treatment and has been tolerating her treatment well. Unfortunately she is unable to continue her treatment with Tarceva because of lack of financial funds that was supporting her previous treatment. Her recent CT scan as well as PET scan showed no clear evidence for disease progression.  I discussed the PET scan results with the patient today. I recommended for her to continue on observation with repeat CT scan of the chest in 3 months for restaging of her disease. If the patient develop any disease progression in the future, I may consider her for treatment with systemic chemotherapy or immunotherapy. She agreed to the current plan. She was advised to call if she has any concerning symptoms in the interval. The patient voices understanding of current disease status and treatment options and is in agreement with the current care plan.  All questions were answered. The patient knows to call the clinic with any problems, questions or concerns. We can certainly see  the patient much sooner if necessary. I spent 10 minutes counseling the patient face to face. The total time spent in the appointment was 15 minutes.  Disclaimer: This note was dictated with voice recognition software. Similar sounding words can inadvertently be transcribed and may not be corrected upon review.

## 2016-12-28 ENCOUNTER — Telehealth: Payer: Self-pay | Admitting: Pharmacist

## 2016-12-28 NOTE — Telephone Encounter (Signed)
Oral Chemotherapy Pharmacist Encounter  Received manufacturer assistance application for [facility] from [place] for patient's Tarceva. Per MD office note 12/26/16, patient is now on treatment break from [facility] due to high copay and depletion of foundation assistance. There are currently no open funds for copayment assistance for NSCLC.dan  Patient has not applied for assistance from the manufacturer yet.  Case discussed with MD, patient on treatment break due to medication acquisition issues. OK to reach out to patient to offer help applying to manufacturer.  I called and discussed case with patient. She prefers to remain on treatment break until next scan and MD visit in early May 2018. If patient re-initiates treatment at that time, she knows to reach out to [facility] for assistance with copayment issues.  Oral Oncology Clinic will sign off at this time. Please let us know if we can be of assistance in the future.  Johny Drilling, PharmD, BCPS, BCOP 12/28/2016  12:32 PM Oral Oncology Clinic [phone]

## 2017-01-15 ENCOUNTER — Telehealth: Payer: Self-pay | Admitting: Internal Medicine

## 2017-01-15 ENCOUNTER — Ambulatory Visit (HOSPITAL_BASED_OUTPATIENT_CLINIC_OR_DEPARTMENT_OTHER): Payer: Medicare Other

## 2017-01-15 DIAGNOSIS — Z95828 Presence of other vascular implants and grafts: Secondary | ICD-10-CM

## 2017-01-15 DIAGNOSIS — C3491 Malignant neoplasm of unspecified part of right bronchus or lung: Secondary | ICD-10-CM

## 2017-01-15 DIAGNOSIS — Z452 Encounter for adjustment and management of vascular access device: Secondary | ICD-10-CM | POA: Diagnosis not present

## 2017-01-15 MED ORDER — SODIUM CHLORIDE 0.9 % IJ SOLN
10.0000 mL | INTRAMUSCULAR | Status: DC | PRN
  Administered 2017-01-15: 10 mL via INTRAVENOUS
  Filled 2017-01-15: qty 10

## 2017-01-15 MED ORDER — HEPARIN SOD (PORK) LOCK FLUSH 100 UNIT/ML IV SOLN
500.0000 [IU] | Freq: Once | INTRAVENOUS | Status: AC | PRN
  Administered 2017-01-15: 500 [IU] via INTRAVENOUS
  Filled 2017-01-15: qty 5

## 2017-01-15 NOTE — Telephone Encounter (Signed)
Patient came to scheduling to schedule next flush appointment in 6 weeks. Scheduled for 02/26/17. Given AVS report and calendars with future scheduled appointments.

## 2017-01-22 ENCOUNTER — Telehealth: Payer: Self-pay | Admitting: Pulmonary Disease

## 2017-01-22 NOTE — Telephone Encounter (Signed)
Spoke with pt and made her aware that Dr. Lenna Gilford is not accepting new pt and that she will need to see another physician.  Pt understood and agreed to see someone else. Wilburn Cornelia made her an appointment with SN on 02/19/17. Nothing further is needed at this time.

## 2017-02-19 ENCOUNTER — Encounter: Payer: Self-pay | Admitting: Pulmonary Disease

## 2017-02-19 ENCOUNTER — Ambulatory Visit (INDEPENDENT_AMBULATORY_CARE_PROVIDER_SITE_OTHER): Payer: Medicare Other | Admitting: Pulmonary Disease

## 2017-02-19 VITALS — BP 190/80 | HR 84 | Ht 60.5 in | Wt 243.2 lb

## 2017-02-19 DIAGNOSIS — R0609 Other forms of dyspnea: Secondary | ICD-10-CM

## 2017-02-19 DIAGNOSIS — C3491 Malignant neoplasm of unspecified part of right bronchus or lung: Secondary | ICD-10-CM

## 2017-02-19 NOTE — Progress Notes (Signed)
Subjective:    Patient ID: Diane [name], female    DOB: Jul 16, 1941, 76 y.o.   MRN: [id number]  HPI The patient reports that she has had an intermittent cough that preceded & followed her diagnosis of lung cancer remotely. She reports she had a "cold" in January where she felt "sick as a dog". She denies any associated fever. She had a cough producing a "yellow" mucus. She denies any hemoptysis recently. She did have a "light" sore throat as well as sinus congestion at that time as well. She reports her cough has returned to her baseline. She reports she did note some dyspnea after her prior lung cancer treatment. She does report more dyspnea on exertion since her recent illness. She denies any associated chest tightness or pain. No abdominal pain, nausea or emesis. Denies any syncope or near syncope. Denies any chills or sweats. No weight loss. Denies any history of pneumonia or bronchitis. Denies any seasonal allergies or sinus congestion. She stopped Erlotinib 3 months ago. Patient reports dyspnea doesn't seem to be progressing. Patient does report intermittently associated wheezing.  Review of Systems No dysphagia or odynophagia. She reports some intermittent numbness & cramping I her hands but no other focal weakness. A pertinent 14 point review of systems is negative except as per the history of presenting illness.  Allergies  Allergen Reactions  . Septra Ds [Sulfamethoxazole-Trimethoprim] Nausea Only    Current Outpatient Prescriptions on File Prior to Visit  Medication Sig Dispense Refill  . aspirin 81 MG tablet Take 81 mg by mouth daily.      . calcitRIOL (ROCALTROL) 0.25 MCG capsule Take 0.25 mcg by mouth every Monday, Wednesday, and Friday.    . labetalol (NORMODYNE) 200 MG tablet Take 200 mg by mouth 2 (two) times daily. Take 1 1/2 pills two times daily    . polyethylene glycol (MIRALAX / GLYCOLAX) packet Take 17 g by mouth daily. Reported on 06/05/2016    . torsemide (DEMADEX) 20  MG tablet Take 20 mg by mouth daily.      Marland Kitchen erlotinib (TARCEVA) 100 MG tablet Take 1 tablet (100 mg total) by mouth daily. Take on an empty stomach 1 hour before meals or 2 hours after. (Patient not taking: Reported on 02/19/2017) 30 tablet 1  . neomycin-polymyxin b-dexamethasone (MAXITROL) 3.5-10000-0.1 OINT      Current Facility-Administered Medications on File Prior to Visit  Medication Dose Route Frequency Provider Last Rate Last Dose  . sodium chloride 0.9 % injection 10 mL  10 mL Intravenous PRN Curt Bears, MD   10 mL at 10/14/13 1030    Past Medical History:  Diagnosis Date  . Acute bronchitis 10/16/2016  . Hypertension   . lung ca dx'd 02/2010  . 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 Renal insufficiency    stage 3 renal disease/ pt    Past Surgical History:  Procedure Laterality Date  . PAROTID GLAND TUMOR EXCISION Left   . PORTACATH PLACEMENT  11/03/08-[facility]   tip in mid SVC-E. Mansell  . THORACENTESIS Right   . TONSILLECTOMY      Family History  Problem Relation Age of Onset  . Breast cancer Mother   . Heart attack Mother   . Diabetes Father   . Heart attack Father   . Diabetes Brother   . COPD Cousin     Social History   Social History  . Marital status: Married    Spouse name: N/A  . Number  of children: N/A  . Years of education: N/A   Social History Main Topics  . Smoking status: Former Smoker    Packs/day: 2.00    Years: 40.00    Types: Cigarettes    Quit date: 11/27/2001  . Smokeless tobacco: Never Used  . Alcohol use No  . Drug use: No  . Sexual activity: Not Asked   Other Topics Concern  . None   Social History Narrative   [facility] (02/19/17):   Patient is originally from [facility]. She has also lived in [place]. She has worked primarily Veterinary surgeon. Has a dog currently. No bird or mold exposure.       Objective:   Physical Exam BP (!) 190/80   Pulse 84   Ht 5' 0.5" (1.537 m)   Wt 243 lb 3.2 oz (110.3 kg)   SpO2  98%   BMI 46.71 kg/m  General:  Awake. Alert. No acute distress. Mild central obesity. Integument:  Warm & dry. No rash on exposed skin. No bruising. Extremities:  No cyanosis or clubbing.  Lymphatics:  No appreciated cervical or supraclavicular lymphadenoapthy. HEENT:  Moist mucus membranes. No oral ulcers. No scleral injection or icterus. No nasal turbinate swelling. Cardiovascular:  Regular rate. Normal S1 & S2. No edema.  Pulmonary:  Good aeration & clear to auscultation bilaterally. Symmetric chest wall expansion. No accessory muscle use. Abdomen: Soft. Normal bowel sounds. Protuberant. Grossly nontender. Musculoskeletal:  Normal bulk and tone. Hand grip strength 5/5 bilaterally. No joint deformity or effusion appreciated. Neurological:  CN 2-12 grossly in tact. No meningismus. Moving all 4 extremities equally. Symmetric brachioradialis deep tendon reflexes. Psychiatric:  Mood and affect congruent. Speech normal rhythm, rate & tone.   IMAGING PET CT 12/25/16 (per radiologist):  IMPRESSION: 1. Mild degradation secondary to patient body habitus. 2. Central right lower lobe masslike consolidation demonstrates moderate, relatively diffuse hypermetabolism. This pattern favors evolving radiation change. Residual/recurrent disease cannot be excluded. Potential clinical strategies include CT followup at 3 months or if there is a strong clinical concern of recurrent disease, bronchoscopy with attention to the right lower lobe. 3. No evidence of extrathoracic disease. 4. Chronic right-sided pleural effusion. 5.  Coronary artery atherosclerosis. Aortic atherosclerosis. 6.  Possible constipation.  CT CHEST W/O 12/04/16 (personally reviewed by me):  No pathologic mediastinal adenopathy. Retraction of the mediastinum to the right with consolidation with air bronchograms consistent with previous right-sided radiation treatment. Some calcification within right lower lobe as well. Questionable increase in  density of right lower lobe masslike opacification as well. Subcentimeter groundglass nodules within the left upper lobe. No appreciated pleural effusion. No pericardial effusion.    Assessment & Plan:  76 y.o. Female with history of non-small cell lung cancer of the right lung. Patient is status post chemotherapy & XRT. Also has history of loculated fluid within the right lower lobe which likely correlates with the opacity seen on her CT scan. The left upper lobe groundglass nodules are likely postinflammatory from her previous illness. Her wheezing and dyspnea on exertion are likely secondary to underlying COPD which was unmasked by her respiratory illness. We will need to obtain further pulmonary function testing to confirm this. I instructed the patient to contact my office if she had any further breathing problems or questions before her next appointment.   1. Dyspnea on exertion: Checking 6 minute walk test on room air as well as full pulmonary function testing before next appointment. 2. Right lung non-small cell lung cancer: Management per medical  oncology. 3. Left upper lobe groundglass nodules: Patient is scheduled for repeat CT imaging in April. 4. Follow-up: Patient to return to clinic in 4 weeks or sooner if needed.  Sonia Baller Ashok Cordia, M.D. [facility] Pager:  [phone] After 3pm or if no response, call [phone] 2:13 PM 02/19/17

## 2017-02-19 NOTE — Patient Instructions (Signed)
   We will review your breathing tests when I see you back.  Remember to check with your insurance company to see whether the breathing tests I am ordering will be cheaper at our office or at [facility]: 1. Full PFTs before next appointment 2. 6MWT before next appointment

## 2017-02-26 ENCOUNTER — Ambulatory Visit (HOSPITAL_BASED_OUTPATIENT_CLINIC_OR_DEPARTMENT_OTHER): Payer: Medicare Other

## 2017-02-26 DIAGNOSIS — Z452 Encounter for adjustment and management of vascular access device: Secondary | ICD-10-CM | POA: Diagnosis not present

## 2017-02-26 DIAGNOSIS — C3491 Malignant neoplasm of unspecified part of right bronchus or lung: Secondary | ICD-10-CM | POA: Diagnosis not present

## 2017-02-26 DIAGNOSIS — Z95828 Presence of other vascular implants and grafts: Secondary | ICD-10-CM

## 2017-02-26 MED ORDER — HEPARIN SOD (PORK) LOCK FLUSH 100 UNIT/ML IV SOLN
500.0000 [IU] | Freq: Once | INTRAVENOUS | Status: AC | PRN
  Administered 2017-02-26: 500 [IU] via INTRAVENOUS
  Filled 2017-02-26: qty 5

## 2017-02-26 MED ORDER — SODIUM CHLORIDE 0.9 % IJ SOLN
10.0000 mL | INTRAMUSCULAR | Status: DC | PRN
  Administered 2017-02-26: 10 mL via INTRAVENOUS
  Filled 2017-02-26: qty 10

## 2017-03-20 ENCOUNTER — Ambulatory Visit: Payer: Medicare Other | Admitting: Pulmonary Disease

## 2017-03-20 ENCOUNTER — Ambulatory Visit: Payer: Medicare Other

## 2017-03-20 ENCOUNTER — Ambulatory Visit (HOSPITAL_COMMUNITY): Payer: Medicare Other

## 2017-03-23 ENCOUNTER — Ambulatory Visit (INDEPENDENT_AMBULATORY_CARE_PROVIDER_SITE_OTHER): Payer: Medicare Other | Admitting: Pulmonary Disease

## 2017-03-23 ENCOUNTER — Encounter: Payer: Self-pay | Admitting: Pulmonary Disease

## 2017-03-23 VITALS — BP 150/80 | HR 88 | Ht 65.0 in | Wt 250.4 lb

## 2017-03-23 DIAGNOSIS — R918 Other nonspecific abnormal finding of lung field: Secondary | ICD-10-CM | POA: Insufficient documentation

## 2017-03-23 DIAGNOSIS — C3490 Malignant neoplasm of unspecified part of unspecified bronchus or lung: Secondary | ICD-10-CM

## 2017-03-23 DIAGNOSIS — R0609 Other forms of dyspnea: Secondary | ICD-10-CM | POA: Diagnosis not present

## 2017-03-23 NOTE — Progress Notes (Signed)
Subjective:    Patient ID: Diane [name], female    DOB: 25-Sep-1941, 76 y.o.   MRN: [id number]  C.C.:  Follow-up for Dyspnea on Exertion, NSCLC, & LUL Groundglass Nodules.  HPI Dyspnea on Exertion: Diagnosed with congestive heart failure since last appointment. She is transferring her care to Dr. Wynonia Lawman. She has increased her Demadex to '40mg'$  bid. She does feel her breathing is doing better and she is sleeping better at night. She still has dyspnea on exertion. She reports rare coughing. She has rare wheezing but hasn't done so in a long time.   NSCLC: Present within her right lung. Stage IIa adenocarcinoma diagnosed in March 2008. Follows with medical oncology. Patient underwent concurrent chemoradiation treatment with last dose in May 2008. Patient received consolidation chemotherapy with last dose in August 2008. 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 She was subsequently treated with chemotherapy and ANA April 2011 and completed 37 months of Tarceva following this. Patient was unable to afford further treatment and this was discontinued.  LUL Groundglass Nodules:  Scheduled for follow-up CT imaging on 4/30.  Review of Systems No chest pain or tightness. No fever, chills, or sweats. No abdominal pain or nausea.   Allergies  Allergen Reactions  . Septra Ds [Sulfamethoxazole-Trimethoprim] Nausea Only    Current Outpatient Prescriptions on File Prior to Visit  Medication Sig Dispense Refill  . aspirin 81 MG tablet Take 81 mg by mouth daily.      . calcitRIOL (ROCALTROL) 0.25 MCG capsule Take 0.25 mcg by mouth every Monday, Wednesday, and Friday.    . labetalol (NORMODYNE) 200 MG tablet Take 200 mg by mouth 2 (two) times daily. Take 1 1/2 pills two times daily    . neomycin-polymyxin b-dexamethasone (MAXITROL) 3.5-10000-0.1 OINT     . polyethylene glycol (MIRALAX / GLYCOLAX) packet Take 17 g by mouth daily. Reported on 06/05/2016    . torsemide (DEMADEX) 20  MG tablet Take 40 mg by mouth 2 (two) times daily.     Marland Kitchen erlotinib (TARCEVA) 100 MG tablet Take 1 tablet (100 mg total) by mouth daily. Take on an empty stomach 1 hour before meals or 2 hours after. (Patient not taking: Reported on 03/23/2017) 30 tablet 1   Current Facility-Administered Medications on File Prior to Visit  Medication Dose Route Frequency Provider Last Rate Last Dose  . sodium chloride 0.9 % injection 10 mL  10 mL Intravenous PRN Curt Bears, MD   10 mL at 10/14/13 1030    Past Medical History:  Diagnosis Date  . Acute bronchitis 10/16/2016  . Hypertension   . lung ca dx'd 02/2010  . 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 Renal insufficiency    stage 3 renal disease/ pt    Past Surgical History:  Procedure Laterality Date  . PAROTID GLAND TUMOR EXCISION Left   . PORTACATH PLACEMENT  11/03/08-[place]   tip in mid SVC-E. Mansell  . THORACENTESIS Right   . TONSILLECTOMY      Family History  Problem Relation Age of Onset  . Breast cancer Mother   . Heart attack Mother   . Diabetes Father   . Heart attack Father   . Diabetes Brother   . COPD Cousin     Social History   Social History  . Marital status: Married    Spouse name: N/A  . Number of children: N/A  . Years of education: N/A   Social History Main Topics  .  Smoking status: Former Smoker    Packs/day: 2.00    Years: 40.00    Types: Cigarettes    Quit date: 11/27/2001  . Smokeless tobacco: Never Used  . Alcohol use No  . Drug use: No  . Sexual activity: Not Asked   Other Topics Concern  . None   Social History Narrative   [facility] (02/19/17):   Patient is originally from [facility]. She has also lived in [place]. She has worked primarily Veterinary surgeon. Has a dog currently. No bird or mold exposure.       Objective:   Physical Exam BP (!) 150/80 (BP Location: Right Arm, Patient Position: Sitting, Cuff Size: Large)   Pulse 88   Ht '5\' 5"'$  (1.651 m)   Wt 250 lb 6.4 oz (113.6 kg)    SpO2 93%   BMI 41.67 kg/m   Gen.: Obese. No distress. Comfortable. Integument: No rash exposed skin. No bruising. Warm and dry. Pulmonary: Good aeration bilaterally. Clear to auscultation. Normal work of breathing on room air. Cardiovascular: Regular rate. Regular rhythm. No edema. HEENT: Minimal nasal turbinate swelling. No oral ulcers. No scleral icterus. Abdomen: Soft. Protuberant. Normal bowel sounds.  IMAGING PET CT 12/25/16 (per radiologist):  IMPRESSION: 1. Mild degradation secondary to patient body habitus. 2. Central right lower lobe masslike consolidation demonstrates moderate, relatively diffuse hypermetabolism. This pattern favors evolving radiation change. Residual/recurrent disease cannot be excluded. Potential clinical strategies include CT followup at 3 months or if there is a strong clinical concern of recurrent disease, bronchoscopy with attention to the right lower lobe. 3. No evidence of extrathoracic disease. 4. Chronic right-sided pleural effusion. 5.  Coronary artery atherosclerosis. Aortic atherosclerosis. 6.  Possible constipation.  CT CHEST W/O 12/04/16 (previously reviewed by me):  No pathologic mediastinal adenopathy. Retraction of the mediastinum to the right with consolidation with air bronchograms consistent with previous right-sided radiation treatment. Some calcification within right lower lobe as well. Questionable increase in density of right lower lobe masslike opacification as well. Subcentimeter groundglass nodules within the left upper lobe. No appreciated pleural effusion. No pericardial effusion.    Assessment & Plan:  76 y.o. Female with dyspnea on exertion, adenocarcinoma of the right lung, & left upper lobe groundglass nodules. Patient discovered that she had congestive heart failure since last appointment. Her dyspnea has improved but she continues to have dyspnea on exertion. With her history of lung cancer and smoking she will need pulmonary  function to better assess the possible presence of underlying COPD. Additionally, she could be having hypoxia on exertion and will need a walk test as well. I instructed the patient contact my office if she had any new breathing problems or questions before next appointment.  1. Dyspnea on exertion: Reordering full pulmonary function testing and 6 minute walk test on room air before next appointment. 2. NSCLC: Patient has follow-up with medical oncology. 3. Left upper lobe groundglass nodules: Patient has follow-up imaging scheduled on 4/30. 4. Follow-up: Return to clinic in 6 weeks or sooner if needed.  Sonia Baller Ashok Cordia, M.D. [facility] Pager:  [phone] After 3pm or if no response, call [phone] 4:15 PM 03/23/17

## 2017-03-23 NOTE — Patient Instructions (Signed)
   We will review your test results when I see you back.  Call me if you have any new breathing problems or questions before your next appointment.  TESTS ORDERED: 1. Full PFTs before next appointment 2. 6MWT on room air before next appointment

## 2017-03-26 ENCOUNTER — Encounter (HOSPITAL_COMMUNITY): Payer: Self-pay

## 2017-03-26 ENCOUNTER — Ambulatory Visit (HOSPITAL_COMMUNITY)
Admission: RE | Admit: 2017-03-26 | Discharge: 2017-03-26 | Disposition: A | Discharge status: Home / Self Care | Payer: Medicare Other | Source: Ambulatory Visit | Attending: Internal Medicine | Admitting: Internal Medicine

## 2017-03-26 ENCOUNTER — Other Ambulatory Visit (HOSPITAL_BASED_OUTPATIENT_CLINIC_OR_DEPARTMENT_OTHER): Payer: Medicare Other

## 2017-03-26 DIAGNOSIS — J90 Pleural effusion, not elsewhere classified: Secondary | ICD-10-CM | POA: Insufficient documentation

## 2017-03-26 DIAGNOSIS — C3491 Malignant neoplasm of unspecified part of right bronchus or lung: Secondary | ICD-10-CM | POA: Insufficient documentation

## 2017-03-26 LAB — CBC WITH DIFFERENTIAL/PLATELET
BASO%: 0.6 % (ref 0.0–2.0)
Basophils Absolute: 0 10*3/uL (ref 0.0–0.1)
EOS%: 4.5 % (ref 0.0–7.0)
Eosinophils Absolute: 0.2 10*3/uL (ref 0.0–0.5)
HCT: 35.8 % (ref 34.8–46.6)
HEMOGLOBIN: 11.7 g/dL (ref 11.6–15.9)
LYMPH#: 0.8 10*3/uL — AB (ref 0.9–3.3)
LYMPH%: 15.5 % (ref 14.0–49.7)
MCH: 31.6 pg (ref 25.1–34.0)
MCHC: 32.7 g/dL (ref 31.5–36.0)
MCV: 96.8 fL (ref 79.5–101.0)
MONO#: 0.5 10*3/uL (ref 0.1–0.9)
MONO%: 9.5 % (ref 0.0–14.0)
NEUT%: 69.9 % (ref 38.4–76.8)
NEUTROS ABS: 3.8 10*3/uL (ref 1.5–6.5)
Platelets: 164 10*3/uL (ref 145–400)
RBC: 3.7 10*6/uL (ref 3.70–5.45)
RDW: 14.8 % — AB (ref 11.2–14.5)
WBC: 5.4 10*3/uL (ref 3.9–10.3)

## 2017-03-26 LAB — COMPREHENSIVE METABOLIC PANEL
ALT: 9 U/L (ref 0–55)
AST: 15 U/L (ref 5–34)
Albumin: 3.2 g/dL — ABNORMAL LOW (ref 3.5–5.0)
Alkaline Phosphatase: 74 U/L (ref 40–150)
Anion Gap: 10 mEq/L (ref 3–11)
BUN: 30.7 mg/dL — AB (ref 7.0–26.0)
CHLORIDE: 102 meq/L (ref 98–109)
CO2: 28 meq/L (ref 22–29)
CREATININE: 1.7 mg/dL — AB (ref 0.6–1.1)
Calcium: 9.2 mg/dL (ref 8.4–10.4)
EGFR: 29 mL/min/{1.73_m2} — ABNORMAL LOW (ref >90)
Glucose: 93 mg/dl (ref 70–140)
POTASSIUM: 4.4 meq/L (ref 3.5–5.1)
SODIUM: 141 meq/L (ref 136–145)
Total Bilirubin: 0.72 mg/dL (ref 0.20–1.20)
Total Protein: 6.5 g/dL (ref 6.4–8.3)

## 2017-03-28 ENCOUNTER — Encounter: Payer: Self-pay | Admitting: Internal Medicine

## 2017-03-28 ENCOUNTER — Telehealth: Payer: Self-pay | Admitting: Internal Medicine

## 2017-03-28 ENCOUNTER — Ambulatory Visit (HOSPITAL_BASED_OUTPATIENT_CLINIC_OR_DEPARTMENT_OTHER): Payer: Medicare Other | Admitting: Internal Medicine

## 2017-03-28 VITALS — BP 126/58 | HR 81 | Temp 98.4°F | Resp 18 | Ht 65.0 in | Wt 244.0 lb

## 2017-03-28 DIAGNOSIS — I509 Heart failure, unspecified: Secondary | ICD-10-CM

## 2017-03-28 DIAGNOSIS — C3491 Malignant neoplasm of unspecified part of right bronchus or lung: Secondary | ICD-10-CM

## 2017-03-28 NOTE — Progress Notes (Signed)
[place] [phone]   [phone]  OFFICE PROGRESS NOTE  [name], MD [place] [address]  DIAGNOSIS: Recurrent non-small cell lung cancer, adenocarcinoma with negative EGFR and ALK mutations initially diagnosed as a stage IIA in March 2008.  PRIOR THERAPY: 1. Status post concurrent chemoradiation with weekly carboplatin and paclitaxel. Last dose was given Apr 12, 2007. 2. Status post 3 cycles of consolidation chemotherapy with docetaxel. Last dose was given July 12, 2007. 3. 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 4. Status post 39 cycles of maintenance Alimta at a dose of 500 mg/m2. Last dose was given February 25, 2010, discontinued secondary to persistent anemia, but the patient has stable disease. 5. Tarceva 150 mg by mouth daily. Status post approximately 37 months of therapy.   6. Tarceva 100 mg by mouth daily, status post 13 months of treatment.  CURRENT THERAPY: Observation.  INTERVAL HISTORY: Diane [name] 76 y.o. female returns to the clinic today for three-month follow-up visit. The patient is feeling fine today with no specific complaints. She was recently diagnosed with congestive heart failure and she is currently under the care of Dr. Wynonia Lawman. She denied having any chest pain but continues to have shortness of breath with exertion was no cough or hemoptysis. She has no fever or chills. She denied having any nausea, vomiting, diarrhea or constipation. She has no weight loss or night sweats. She has been observation for the last few months.  MEDICAL HISTORY: Past Medical History:  Diagnosis Date  . Acute bronchitis 10/16/2016  . Hypertension   . lung ca dx'd 02/2010  . 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 Renal insufficiency    stage 3 renal disease/ pt    ALLERGIES:  is allergic to septra ds  [sulfamethoxazole-trimethoprim].  MEDICATIONS:  Current Outpatient Prescriptions  Medication Sig Dispense Refill  . aspirin 81 MG tablet Take 81 mg by mouth daily.      . calcitRIOL (ROCALTROL) 0.25 MCG capsule Take 0.25 mcg by mouth every Monday, Wednesday, and Friday.    . erlotinib (TARCEVA) 100 MG tablet Take 1 tablet (100 mg total) by mouth daily. Take on an empty stomach 1 hour before meals or 2 hours after. (Patient not taking: Reported on 03/23/2017) 30 tablet 1  . labetalol (NORMODYNE) 200 MG tablet Take 200 mg by mouth 2 (two) times daily. Take 1 1/2 pills two times daily    . neomycin-polymyxin b-dexamethasone (MAXITROL) 3.5-10000-0.1 OINT     . polyethylene glycol (MIRALAX / GLYCOLAX) packet Take 17 g by mouth daily. Reported on 06/05/2016    . torsemide (DEMADEX) 20 MG tablet Take 40 mg by mouth 2 (two) times daily.      No current facility-administered medications for this visit.    Facility-Administered Medications Ordered in Other Visits  Medication Dose Route Frequency Provider Last Rate Last Dose  . sodium chloride 0.9 % injection 10 mL  10 mL Intravenous PRN Curt Bears, MD   10 mL at 10/14/13 1030    SURGICAL HISTORY:  Past Surgical History:  Procedure Laterality Date  . PAROTID GLAND TUMOR EXCISION Left   . PORTACATH PLACEMENT  11/03/08-[place]   tip in mid SVC-E. Mansell  . THORACENTESIS Right   . TONSILLECTOMY      REVIEW OF SYSTEMS:  A comprehensive review of systems was negative except for: Constitutional: positive for fatigue Respiratory:  positive for dyspnea on exertion   PHYSICAL EXAMINATION: General appearance: alert, cooperative, fatigued and no distress Head: Normocephalic, without obvious abnormality, atraumatic Neck: no adenopathy, no JVD, supple, symmetrical, trachea midline and thyroid not enlarged, symmetric, no tenderness/mass/nodules Lymph nodes: Cervical, supraclavicular, and axillary nodes normal. Resp: clear to auscultation  bilaterally Back: symmetric, no curvature. ROM normal. No CVA tenderness. Cardio: regular rate and rhythm, S1, S2 normal, no murmur, click, rub or gallop GI: soft, non-tender; bowel sounds normal; no masses,  no organomegaly Extremities: extremities normal, atraumatic, no cyanosis or edema  ECOG PERFORMANCE STATUS: 1 - Symptomatic but completely ambulatory  Blood pressure (!) 126/58, pulse 81, temperature 98.4 F (36.9 C), temperature source Oral, resp. rate 18, height '5\' 5"'$  (1.651 m), weight 244 lb (110.7 kg), SpO2 97 %.  LABORATORY DATA: Lab Results  Component Value Date   WBC 5.4 03/26/2017   HGB 11.7 03/26/2017   HCT 35.8 03/26/2017   MCV 96.8 03/26/2017   PLT 164 03/26/2017      Chemistry      Component Value Date/Time   NA 141 03/26/2017 0903   K 4.4 03/26/2017 0903   CL 102 05/08/2013 0851   CO2 28 03/26/2017 0903   BUN 30.7 (H) 03/26/2017 0903   CREATININE 1.7 (H) 03/26/2017 0903      Component Value Date/Time   CALCIUM 9.2 03/26/2017 0903   ALKPHOS 74 03/26/2017 0903   AST 15 03/26/2017 0903   ALT 9 03/26/2017 0903   BILITOT 0.72 03/26/2017 0903       RADIOGRAPHIC STUDIES: Ct Chest Wo Contrast  Result Date: 03/26/2017 CLINICAL DATA:  Recurrent non-small cell lung cancer, adenocarcinoma with negative EGFR and ALK mutations initially diagnosed as a stage IIA in March 2008. Chemotherapy completed. EXAM: CT CHEST WITHOUT CONTRAST TECHNIQUE: Multidetector CT imaging of the chest was performed following the standard protocol without IV contrast. COMPARISON:  Chest CT 12/04/2016.  PET-CT 12/25/2016. FINDINGS: Cardiovascular: Aortic, great vessel and coronary artery atherosclerosis noted. There is stable cardiomegaly. The left IJ Port-A-Cath tip is unchanged in the mid SVC. The left brachiocephalic vein is decompressed. No acute vascular findings are seen on noncontrast imaging. Mediastinum/Nodes: There are no enlarged mediastinal, hilar or axillary lymph nodes.Small left  supraclavicular nodes are unchanged. There is stable nodularity of the thyroid gland without associated hypermetabolic activity on recent PET-CT. Lungs/Pleura: There is a stable partially loculated, predominately subpulmonic right pleural effusion with associated calcifications. 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 Scattered right lung densities are stable. There are no focal lesions within the left lung. Moderate emphysematous changes are present. Upper abdomen:  Unremarkable.  There is no adrenal mass. Musculoskeletal/Chest wall: There is no chest wall mass or suspicious osseous finding. Prominent lower thoracic spondylosis noted. IMPRESSION: 1. No significant changes are seen compared with the prior chest CT of nearly 4 months ago. The right perihilar and infrahilar densities are stable, most consistent with radiation fibrosis as correlated with PET-CT. 2. Stable partially loculated right pleural effusion. 3. No adenopathy or new pulmonary nodules. Electronically Signed   By: Richardean Sale M.D.   On: 03/26/2017 09:58    ASSESSMENT AND PLAN:  This is a very pleasant 76 years old white female with recurrent non-small cell lung cancer, adenocarcinoma diagnosed in March 2008 status post several chemotherapy regimens and was on treatment with Tarceva for close to 4 years.  The patient has been observation for the last 6 months and she is feeling fine. Her recent CT scan of the chest showed no evidence for disease progression. I discussed the scan results with the patient today. I recommended for her to continue on observation with repeat CT scan of the chest in 3 months. For congestive heart failure she will continue her current treatment under the care of Dr. Wynonia Lawman. The patient was advised to call immediately if she has any concerning symptoms in  the interval. The patient voices understanding of current disease status and treatment options and is in agreement with the current care plan. All questions were answered. The patient knows to call the clinic with any problems, questions or concerns. We can certainly see the patient much sooner if necessary. I spent 10 minutes counseling the patient face to face. The total time spent in the appointment was 15 minutes.  Disclaimer: This note was dictated with voice recognition software. Similar sounding words can inadvertently be transcribed and may not be corrected upon review.

## 2017-03-28 NOTE — Telephone Encounter (Signed)
Gave patient avs report and appointments for May thru August. [facility] radiology will call re scan.

## 2017-04-11 ENCOUNTER — Ambulatory Visit (HOSPITAL_BASED_OUTPATIENT_CLINIC_OR_DEPARTMENT_OTHER): Payer: Medicare Other

## 2017-04-11 DIAGNOSIS — Z452 Encounter for adjustment and management of vascular access device: Secondary | ICD-10-CM | POA: Diagnosis not present

## 2017-04-11 DIAGNOSIS — C3491 Malignant neoplasm of unspecified part of right bronchus or lung: Secondary | ICD-10-CM

## 2017-04-11 DIAGNOSIS — Z95828 Presence of other vascular implants and grafts: Secondary | ICD-10-CM

## 2017-04-11 MED ORDER — HEPARIN SOD (PORK) LOCK FLUSH 100 UNIT/ML IV SOLN
500.0000 [IU] | Freq: Once | INTRAVENOUS | Status: AC | PRN
  Administered 2017-04-11: 500 [IU] via INTRAVENOUS
  Filled 2017-04-11: qty 5

## 2017-04-11 MED ORDER — SODIUM CHLORIDE 0.9 % IJ SOLN
10.0000 mL | INTRAMUSCULAR | Status: DC | PRN
  Administered 2017-04-11: 10 mL via INTRAVENOUS
  Filled 2017-04-11: qty 10

## 2017-05-23 ENCOUNTER — Ambulatory Visit (INDEPENDENT_AMBULATORY_CARE_PROVIDER_SITE_OTHER): Payer: Medicare Other | Admitting: *Deleted

## 2017-05-23 ENCOUNTER — Ambulatory Visit (HOSPITAL_BASED_OUTPATIENT_CLINIC_OR_DEPARTMENT_OTHER): Payer: Medicare Other

## 2017-05-23 ENCOUNTER — Encounter: Payer: Self-pay | Admitting: Pulmonary Disease

## 2017-05-23 ENCOUNTER — Ambulatory Visit (INDEPENDENT_AMBULATORY_CARE_PROVIDER_SITE_OTHER): Payer: Medicare Other | Admitting: Pulmonary Disease

## 2017-05-23 VITALS — BP 128/70 | HR 79 | Ht 65.0 in | Wt 250.0 lb

## 2017-05-23 DIAGNOSIS — R0609 Other forms of dyspnea: Secondary | ICD-10-CM | POA: Diagnosis not present

## 2017-05-23 DIAGNOSIS — J984 Other disorders of lung: Secondary | ICD-10-CM | POA: Diagnosis not present

## 2017-05-23 DIAGNOSIS — C3491 Malignant neoplasm of unspecified part of right bronchus or lung: Secondary | ICD-10-CM

## 2017-05-23 DIAGNOSIS — Z95828 Presence of other vascular implants and grafts: Secondary | ICD-10-CM

## 2017-05-23 HISTORY — DX: Other disorders of lung: J98.4

## 2017-05-23 LAB — PULMONARY FUNCTION TEST
DL/VA % PRED: 89 %
DL/VA: 4.4 ml/min/mmHg/L
DLCO UNC % PRED: 43 %
DLCO UNC: 11.03 ml/min/mmHg
DLCO cor % pred: 42 %
DLCO cor: 10.96 ml/min/mmHg
FEF 25-75 PRE: 0.69 L/s
FEF 25-75 Post: 0.7 L/sec
FEF2575-%CHANGE-POST: 0 %
FEF2575-%Pred-Post: 42 %
FEF2575-%Pred-Pre: 42 %
FEV1-%CHANGE-POST: 0 %
FEV1-%PRED-PRE: 54 %
FEV1-%Pred-Post: 54 %
FEV1-PRE: 1.17 L
FEV1-Post: 1.18 L
FEV1FVC-%Change-Post: 2 %
FEV1FVC-%Pred-Pre: 94 %
FEV6-%Change-Post: -2 %
FEV6-%PRED-POST: 59 %
FEV6-%PRED-PRE: 61 %
FEV6-POST: 1.63 L
FEV6-Pre: 1.66 L
FEV6FVC-%PRED-POST: 105 %
FEV6FVC-%PRED-PRE: 105 %
FVC-%Change-Post: -2 %
FVC-%Pred-Post: 56 %
FVC-%Pred-Pre: 58 %
FVC-Post: 1.63 L
FVC-Pre: 1.66 L
PRE FEV6/FVC RATIO: 100 %
Post FEV1/FVC ratio: 72 %
Post FEV6/FVC ratio: 100 %
Pre FEV1/FVC ratio: 70 %
RV % PRED: 103 %
RV: 2.46 L
TLC % PRED: 76 %
TLC: 4 L

## 2017-05-23 MED ORDER — SODIUM CHLORIDE 0.9 % IJ SOLN
10.0000 mL | INTRAMUSCULAR | Status: DC | PRN
  Administered 2017-05-23: 10 mL via INTRAVENOUS
  Filled 2017-05-23: qty 10

## 2017-05-23 MED ORDER — HEPARIN SOD (PORK) LOCK FLUSH 100 UNIT/ML IV SOLN
500.0000 [IU] | Freq: Once | INTRAVENOUS | Status: AC | PRN
  Administered 2017-05-23: 500 [IU] via INTRAVENOUS
  Filled 2017-05-23: qty 5

## 2017-05-23 NOTE — Patient Instructions (Signed)
   Keep an eye on your weight and notify [name] or Dr. Wynonia Lawman if it fluctuates by more than 3-4 pounds in a single day.  Keep an eye on your salt/sodium intake as you have been.  Try to increase your walking to help improve your stamina and promote weight loss.  Contact me if you have any new breathing problems or questions before your next appointment.

## 2017-05-23 NOTE — Progress Notes (Signed)
PFT done today. 

## 2017-05-23 NOTE — Progress Notes (Signed)
SIX MIN WALK 05/23/2017  Medications Aspirin 81mg , Labetalol 200mg , and Torsemide 20mg  were all taken at 7am.   Supplimental Oxygen during Test? (L/min) No  Laps 4  Partial Lap (in Meters) 43  Baseline BP (sitting) 118/70  Baseline Heartrate 86  Baseline Dyspnea (Borg Scale) 5  Baseline Fatigue (Borg Scale) 2  Baseline SPO2 100  BP (sitting) 126/82  Heartrate 95  Dyspnea (Borg Scale) 7  Fatigue (Borg Scale) 5  SPO2 99  BP (sitting) 124/82  Heartrate 87  SPO2 99  Stopped or Paused before Six Minutes Yes  Other Symptoms at end of Exercise Stopped at 4:48 95% on RA 93p stopped due to fatigue and leg pain 1.5 Laps. Stopped at 3:43 97% on RA 93p stopped .5 lap stopped due to fatigued legs and dry mouth. Stopped at 3:04 .5 lap 93% RA 94p. Stopped at 2:40 .5lap 88% on RA. Stopped at 1:08 .5lap 95% on RA stopped due to SOB and fatigued legs.   Interpretation Leg pain  Distance Completed 235  Tech Comments: Patient walked at her normal pace. She stopped multiple times due to fatigued legs and SOB. Only stopped once due to a dry mouth. After recovering from each stop, o2 levels recovered into the upper 90s. Denied any symptoms of chest pain.

## 2017-05-23 NOTE — Progress Notes (Signed)
Subjective:    Patient ID: Diane [name], female    DOB: 1941/04/24, 76 y.o.   MRN: [id number]  C.C.:  Follow-up for Dyspnea on Exertion, NSCLC, & LUL Groundglass Nodules.  HPI Dyspnea on exertion: Likely multifactorial from congestive heart failure & restrictive lung disease. She reports her dyspnea is unchanged. Mild, rare coughing. No wheezing. Does wake up at night coughing up phlegm rarely. She reports she is only able to walk 50 feet before getting out of breath.   Right NSCLC: Stage IIa adenocarcinoma diagnosed in March 2008. Follows with medical oncology. Patient underwent concurrent chemoradiation treatment with last dose in May 2008. Patient received consolidation chemotherapy with last dose in August 2008. 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 She was subsequently treated with chemotherapy and ANA April 2011 and completed 37 months of Tarceva following this. Patient was unable to afford further treatment and this was discontinued.  Left upper lobe groundglass nodules: Seen initially on imaging with CT of the chest January 2018. Some improvement in nodules on repeat imaging in April.   Review of Systems No chest pain or pressure. No fever or chills. No abdominal pain or nausea.   Allergies  Allergen Reactions  . Septra Ds [Sulfamethoxazole-Trimethoprim] Nausea Only    Current Outpatient Prescriptions on File Prior to Visit  Medication Sig Dispense Refill  . aspirin 81 MG tablet Take 81 mg by mouth daily.      . calcitRIOL (ROCALTROL) 0.25 MCG capsule Take 0.25 mcg by mouth every Monday, Wednesday, and Friday.    . labetalol (NORMODYNE) 200 MG tablet Take 200 mg by mouth 2 (two) times daily. Take 1 1/2 pills two times daily    . polyethylene glycol (MIRALAX / GLYCOLAX) packet Take 17 g by mouth daily. Reported on 06/05/2016    . torsemide (DEMADEX) 20 MG tablet Take 40 mg by mouth 2 (two) times daily.     Marland Kitchen erlotinib (TARCEVA) 100 MG tablet Take  1 tablet (100 mg total) by mouth daily. Take on an empty stomach 1 hour before meals or 2 hours after. (Patient not taking: Reported on 05/23/2017) 30 tablet 1  . neomycin-polymyxin b-dexamethasone (MAXITROL) 3.5-10000-0.1 OINT      Current Facility-Administered Medications on File Prior to Visit  Medication Dose Route Frequency Provider Last Rate Last Dose  . sodium chloride 0.9 % injection 10 mL  10 mL Intravenous PRN Curt Bears, MD   10 mL at 10/14/13 1030    Past Medical History:  Diagnosis Date  . Acute bronchitis 10/16/2016  . Hypertension   . lung ca dx'd 02/2010  . 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 Renal insufficiency    stage 3 renal disease/ pt    Past Surgical History:  Procedure Laterality Date  . PAROTID GLAND TUMOR EXCISION Left   . PORTACATH PLACEMENT  11/03/08-[facility]   tip in mid SVC-E. Mansell  . THORACENTESIS Right   . TONSILLECTOMY      Family History  Problem Relation Age of Onset  . Breast cancer Mother   . Heart attack Mother   . Diabetes Father   . Heart attack Father   . Diabetes Brother   . COPD Cousin     Social History   Social History  . Marital status: Married    Spouse name: N/A  . Number of children: N/A  . Years of education: N/A   Social History Main Topics  . Smoking status: Former Smoker  Packs/day: 2.00    Years: 40.00    Types: Cigarettes    Quit date: 11/27/2001  . Smokeless tobacco: Never Used  . Alcohol use No  . Drug use: No  . Sexual activity: Not Asked   Other Topics Concern  . None   Social History Narrative   [facility] (02/19/17):   Patient is originally from [facility]. She has also lived in [place]. She has worked primarily Veterinary surgeon. Has a dog currently. No bird or mold exposure.       Objective:   Physical Exam BP 128/70 (BP Location: Right Arm, Cuff Size: Normal)   Pulse 79   Ht 5\' 5"  (1.651 m)   Wt 250 lb (113.4 kg)   SpO2 100%   BMI 41.60 kg/m   General:  Awake. Alert. No  acute distress. Obese. Patient examined in chair due to inability to get onto the table. Integument:  Warm & dry. No rash on exposed skin. No bruising on exposed skin. Extremities:  No cyanosis or clubbing.  HEENT:  Moist mucus membranes. No oral ulcers. No scleral injection or icterus. Minimal nasal turbinate swelling. Cardiovascular:  Regular rate. 1+ pitting bilateral lower extremity edema. No appreciable JVD given body positioning.  Pulmonary:  Clear to auscultation bilaterally. Good aeration. No accessory muscle use on room air. Abdomen: Soft. Normal bowel sounds. Protuberant. Grossly nontender. Musculoskeletal:  Normal bulk and tone. No joint deformity or effusion appreciated.  PFT 05/23/17: FVC 1.66 L (58%) FEV1 1.17 L (54%) FEV1/FVC 0.70 FEF 25-75 0.69 L (42%) negative bronchodilator response TLC 4.00 L (76%) RV 103% ERV 35% DLCO corrected 42%  6MWT 05/23/17:  Walked 235 meters / Baseline Sat 100% on RA / Nadir Sat 88% on RA @ 2:40 during stop (stopped multiple times during walk due to fatigue and leg pain)  IMAGING CT CHEST W/O 03/26/17 (personally reviewed by me):  Partially loculated right pleural effusion unchanged. Fibrotic changes within right lung relatively unchanged. No pathologic mediastinal adenopathy appreciated. Some improvement in left upper lobe groundglass nodules. No new or developing nodule appreciated. No pericardial effusion.  PET CT 12/25/16 (per radiologist):  IMPRESSION: 1. Mild degradation secondary to patient body habitus. 2. Central right lower lobe masslike consolidation demonstrates moderate, relatively diffuse hypermetabolism. This pattern favors evolving radiation change. Residual/recurrent disease cannot be excluded. Potential clinical strategies include CT followup at 3 months or if there is a strong clinical concern of recurrent disease, bronchoscopy with attention to the right lower lobe. 3. No evidence of extrathoracic disease. 4. Chronic right-sided  pleural effusion. 5.  Coronary artery atherosclerosis. Aortic atherosclerosis. 6.  Possible constipation.  CT CHEST W/O 12/04/16 (previously reviewed by me):  No pathologic mediastinal adenopathy. Retraction of the mediastinum to the right with consolidation with air bronchograms consistent with previous right-sided radiation treatment. Some calcification within right lower lobe as well. Questionable increase in density of right lower lobe masslike opacification as well. Subcentimeter groundglass nodules within the left upper lobe. No appreciated pleural effusion. No pericardial effusion.    Assessment & Plan:  76 y.o. Female with dyspnea on exertion, adenocarcinoma of the right lung, and groundglass nodules on imaging. Patient with known history of congestive heart failure likely contributing to her mild restriction seen on lung volumes today. Patient did have significant desaturation during her walk test but did not remain hypoxic as such to need oxygen therapy. Given the patient's minimal symptoms to suggest underlying COPD I do not feel that initiating treatment with inhalers as necessary. We did  spend a significant amount of time today discussing appropriate dietary and lifestyle modifications to help promote weight loss. I offered cardiopulmonary rehabilitation with the patient does not wish to participate, specifically she does not wish to go to [facility]. I instructed the patient contact my office if she had any new breathing problems or questions before her next appointment.   1. Dyspnea on exertion:  Multifactorial. Low suspicion for significant underlying COPD. Holding on inhaler therapies. Continuing treatment of underlying congestive heart failure & encouraged weight loss through exercise. 2. Mild restrictive lung disease: Likely multifactorial and secondary to previous lung cancer treatment, obesity, and congestive heart failure. No further testing necessary. 3. Right NSCLC:   Following with medical oncology. 4. Left upper lobe groundglass nodules: Improving on repeat imaging. Defer further imaging to medical oncology. 5. Follow-up:  Return to clinic in 6 months or sooner if needed.  Sonia Baller Ashok Cordia, M.D. [facility] Pager:  [phone] After 3pm or if no response, call [phone] 11:07 AM 05/23/17

## 2017-06-27 ENCOUNTER — Other Ambulatory Visit: Payer: Medicare Other

## 2017-06-27 ENCOUNTER — Encounter (HOSPITAL_COMMUNITY): Payer: Self-pay

## 2017-06-27 ENCOUNTER — Ambulatory Visit (HOSPITAL_COMMUNITY)
Admission: RE | Admit: 2017-06-27 | Discharge: 2017-06-27 | Disposition: A | Discharge status: Home / Self Care | Payer: Medicare Other | Source: Ambulatory Visit | Attending: Internal Medicine | Admitting: Internal Medicine

## 2017-06-27 ENCOUNTER — Other Ambulatory Visit (HOSPITAL_BASED_OUTPATIENT_CLINIC_OR_DEPARTMENT_OTHER): Payer: Medicare Other

## 2017-06-27 DIAGNOSIS — J90 Pleural effusion, not elsewhere classified: Secondary | ICD-10-CM | POA: Diagnosis not present

## 2017-06-27 DIAGNOSIS — C3491 Malignant neoplasm of unspecified part of right bronchus or lung: Secondary | ICD-10-CM | POA: Diagnosis not present

## 2017-06-27 DIAGNOSIS — R918 Other nonspecific abnormal finding of lung field: Secondary | ICD-10-CM | POA: Insufficient documentation

## 2017-06-27 DIAGNOSIS — J439 Emphysema, unspecified: Secondary | ICD-10-CM | POA: Diagnosis not present

## 2017-06-27 DIAGNOSIS — I700 Atherosclerosis of aorta: Secondary | ICD-10-CM | POA: Diagnosis not present

## 2017-06-27 LAB — COMPREHENSIVE METABOLIC PANEL
ALK PHOS: 72 U/L (ref 40–150)
ALT: 10 U/L (ref 0–55)
ANION GAP: 8 meq/L (ref 3–11)
AST: 14 U/L (ref 5–34)
Albumin: 3.3 g/dL — ABNORMAL LOW (ref 3.5–5.0)
BILIRUBIN TOTAL: 0.82 mg/dL (ref 0.20–1.20)
BUN: 31.9 mg/dL — ABNORMAL HIGH (ref 7.0–26.0)
CO2: 30 meq/L — AB (ref 22–29)
Calcium: 9.7 mg/dL (ref 8.4–10.4)
Chloride: 103 mEq/L (ref 98–109)
Creatinine: 1.6 mg/dL — ABNORMAL HIGH (ref 0.6–1.1)
EGFR: 30 mL/min/{1.73_m2} — AB (ref >90)
Glucose: 81 mg/dl (ref 70–140)
Potassium: 4.7 mEq/L (ref 3.5–5.1)
Sodium: 141 mEq/L (ref 136–145)
TOTAL PROTEIN: 6.8 g/dL (ref 6.4–8.3)

## 2017-06-27 LAB — CBC WITH DIFFERENTIAL/PLATELET
BASO%: 0.6 % (ref 0.0–2.0)
BASOS ABS: 0 10*3/uL (ref 0.0–0.1)
EOS%: 3.7 % (ref 0.0–7.0)
Eosinophils Absolute: 0.2 10*3/uL (ref 0.0–0.5)
HEMATOCRIT: 39 % (ref 34.8–46.6)
HGB: 12.7 g/dL (ref 11.6–15.9)
LYMPH%: 12.9 % — AB (ref 14.0–49.7)
MCH: 32.3 pg (ref 25.1–34.0)
MCHC: 32.6 g/dL (ref 31.5–36.0)
MCV: 99.2 fL (ref 79.5–101.0)
MONO#: 0.5 10*3/uL (ref 0.1–0.9)
MONO%: 9.4 % (ref 0.0–14.0)
NEUT#: 4 10*3/uL (ref 1.5–6.5)
NEUT%: 73.4 % (ref 38.4–76.8)
PLATELETS: 151 10*3/uL (ref 145–400)
RBC: 3.93 10*6/uL (ref 3.70–5.45)
RDW: 13.7 % (ref 11.2–14.5)
WBC: 5.4 10*3/uL (ref 3.9–10.3)
lymph#: 0.7 10*3/uL — ABNORMAL LOW (ref 0.9–3.3)

## 2017-07-04 ENCOUNTER — Ambulatory Visit (HOSPITAL_BASED_OUTPATIENT_CLINIC_OR_DEPARTMENT_OTHER): Payer: Medicare Other

## 2017-07-04 ENCOUNTER — Encounter: Payer: Self-pay | Admitting: Internal Medicine

## 2017-07-04 ENCOUNTER — Telehealth: Payer: Self-pay | Admitting: Internal Medicine

## 2017-07-04 ENCOUNTER — Ambulatory Visit (HOSPITAL_BASED_OUTPATIENT_CLINIC_OR_DEPARTMENT_OTHER): Payer: Medicare Other | Admitting: Internal Medicine

## 2017-07-04 VITALS — BP 122/55 | HR 87 | Temp 98.1°F | Resp 18 | Ht 65.0 in | Wt 251.9 lb

## 2017-07-04 DIAGNOSIS — R0609 Other forms of dyspnea: Secondary | ICD-10-CM

## 2017-07-04 DIAGNOSIS — M2550 Pain in unspecified joint: Secondary | ICD-10-CM

## 2017-07-04 DIAGNOSIS — I10 Essential (primary) hypertension: Secondary | ICD-10-CM | POA: Diagnosis not present

## 2017-07-04 DIAGNOSIS — C3491 Malignant neoplasm of unspecified part of right bronchus or lung: Secondary | ICD-10-CM

## 2017-07-04 DIAGNOSIS — Z95828 Presence of other vascular implants and grafts: Secondary | ICD-10-CM

## 2017-07-04 MED ORDER — HEPARIN SOD (PORK) LOCK FLUSH 100 UNIT/ML IV SOLN
500.0000 [IU] | Freq: Once | INTRAVENOUS | Status: AC | PRN
  Administered 2017-07-04: 500 [IU] via INTRAVENOUS
  Filled 2017-07-04: qty 5

## 2017-07-04 MED ORDER — SODIUM CHLORIDE 0.9 % IJ SOLN
10.0000 mL | INTRAMUSCULAR | Status: DC | PRN
  Administered 2017-07-04: 10 mL via INTRAVENOUS
  Filled 2017-07-04: qty 10

## 2017-07-04 NOTE — Telephone Encounter (Signed)
Scheduled apt per 8/8 los - Gave patient AVS and calender per los. Central radiology to contact patient with ct schedule.

## 2017-07-04 NOTE — Progress Notes (Signed)
[facility] [phone]   [phone]  OFFICE PROGRESS NOTE  [name], MD [place] [id number]  DIAGNOSIS: Recurrent non-small cell lung cancer, adenocarcinoma with negative EGFR and ALK mutations initially diagnosed as a stage IIA in March 2008.  PRIOR THERAPY: 1. Status post concurrent chemoradiation with weekly carboplatin and paclitaxel. Last dose was given Apr 12, 2007. 2. Status post 3 cycles of consolidation chemotherapy with docetaxel. Last dose was given July 12, 2007. 3. 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 4. Status post 39 cycles of maintenance Alimta at a dose of 500 mg/m2. Last dose was given February 25, 2010, discontinued secondary to persistent anemia, but the patient has stable disease. 5. Tarceva 150 mg by mouth daily. Status post approximately 37 months of therapy.   6. Tarceva 100 mg by mouth daily, status post 13 months of treatment.  CURRENT THERAPY: Observation.  INTERVAL HISTORY: Diane [name] 75 y.o. female returns to the clinic today for three-month follow-up visit. The patient is feeling fine today was no specific complaints except for arthralgia as well as shortness of breath with exertion. She denied having any current chest pain, cough or hemoptysis. She denied having any fever or chills. She has no nausea, vomiting, diarrhea or constipation. The patient denied having any significant weight loss or night sweats. She had repeat CT scan of the chest performed recently and she is here for evaluation and discussion of her scan results.  MEDICAL HISTORY: Past Medical History:  Diagnosis Date  . Acute bronchitis 10/16/2016  . Hypertension   . lung ca dx'd 02/2010  . 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 Renal insufficiency    stage 3 renal disease/ pt    ALLERGIES:  is allergic to septra ds  [sulfamethoxazole-trimethoprim].  MEDICATIONS:  Current Outpatient Prescriptions  Medication Sig Dispense Refill  . aspirin 81 MG tablet Take 81 mg by mouth daily.      . calcitRIOL (ROCALTROL) 0.25 MCG capsule Take 0.25 mcg by mouth every Monday, Wednesday, and Friday.    . labetalol (NORMODYNE) 200 MG tablet Take 200 mg by mouth 2 (two) times daily. Take 1 1/2 pills two times daily    . polyethylene glycol (MIRALAX / GLYCOLAX) packet Take 17 g by mouth daily. Reported on 06/05/2016    . torsemide (DEMADEX) 20 MG tablet Take 40 mg by mouth 2 (two) times daily.      No current facility-administered medications for this visit.    Facility-Administered Medications Ordered in Other Visits  Medication Dose Route Frequency Provider Last Rate Last Dose  . sodium chloride 0.9 % injection 10 mL  10 mL Intravenous PRN Curt Bears, MD   10 mL at 10/14/13 1030    SURGICAL HISTORY:  Past Surgical History:  Procedure Laterality Date  . PAROTID GLAND TUMOR EXCISION Left   . PORTACATH PLACEMENT  11/03/08-[facility]   tip in mid SVC-E. Mansell  . THORACENTESIS Right   . TONSILLECTOMY      REVIEW OF SYSTEMS:  A comprehensive review of systems was negative except for: Respiratory: positive for dyspnea on exertion Musculoskeletal: positive for arthralgias   PHYSICAL EXAMINATION: General appearance: alert, cooperative and no distress Head: Normocephalic, without obvious abnormality, atraumatic Neck: no adenopathy, no JVD, supple, symmetrical, trachea midline and thyroid not enlarged, symmetric, no tenderness/mass/nodules Lymph nodes: Cervical, supraclavicular, and axillary nodes normal. Resp: clear  to auscultation bilaterally Back: symmetric, no curvature. ROM normal. No CVA tenderness. Cardio: regular rate and rhythm, S1, S2 normal, no murmur, click, rub or gallop GI: soft, non-tender; bowel sounds normal; no masses,  no organomegaly Extremities: extremities normal, atraumatic, no cyanosis or  edema  ECOG PERFORMANCE STATUS: 1 - Symptomatic but completely ambulatory  Blood pressure (!) 122/55, pulse 87, temperature 98.1 F (36.7 C), temperature source Oral, resp. rate 18, height 5' 5" (1.651 m), weight 251 lb 14.4 oz (114.3 kg), SpO2 94 %.  LABORATORY DATA: Lab Results  Component Value Date   WBC 5.4 06/27/2017   HGB 12.7 06/27/2017   HCT 39.0 06/27/2017   MCV 99.2 06/27/2017   PLT 151 06/27/2017      Chemistry      Component Value Date/Time   NA 141 06/27/2017 1101   K 4.7 06/27/2017 1101   CL 102 05/08/2013 0851   CO2 30 (H) 06/27/2017 1101   BUN 31.9 (H) 06/27/2017 1101   CREATININE 1.6 (H) 06/27/2017 1101      Component Value Date/Time   CALCIUM 9.7 06/27/2017 1101   ALKPHOS 72 06/27/2017 1101   AST 14 06/27/2017 1101   ALT 10 06/27/2017 1101   BILITOT 0.82 06/27/2017 1101       RADIOGRAPHIC STUDIES: Ct Chest Wo Contrast  Result Date: 06/27/2017 CLINICAL DATA:  Right lung cancer, XRT complete in 2008, chemotherapy complete in 2011, Tarceva complete January 2018. Chronic shortness of breath on exertion EXAM: CT CHEST WITHOUT CONTRAST TECHNIQUE: Multidetector CT imaging of the chest was performed following the standard protocol without IV contrast. COMPARISON:  03/26/2017 FINDINGS: Cardiovascular: Heart is top-normal in size. No pericardial effusion. No evidence of thoracic aortic aneurysm. Atherosclerotic calcifications of the aortic arch. Three vessel coronary atherosclerosis. Left chest port terminates in the upper SVC. Mediastinum/Nodes: No suspicious mediastinal lymphadenopathy. Stable calcified and noncalcified thyroid nodularity. Lungs/Pleura: Radiation changes in the right perihilar region and right lower lobe. Associated volume loss in the right hemithorax. Moderate right pleural effusion, loculated and predominantly subpulmonic, with chronic pleural thickening and calcifications. Stable irregular right upper lobe nodule (series 7/ image 30) and right  lower lobe (series 7/image 63), chronic across multiple prior studies. Additional nodularity in the right lung is predominantly subpleural and is also unchanged. Underlying mild centrilobular emphysematous changes, upper lobe predominant. No focal consolidation. No pneumothorax. Upper Abdomen: Visualized upper abdomen is otherwise unremarkable. Musculoskeletal: Degenerative changes of the visualized thoracolumbar spine. IMPRESSION: Stable radiation changes in the right lung with chronic right pleural effusion. Stable right lung nodularity. No evidence of recurrent or metastatic disease. Aortic Atherosclerosis (ICD10-I70.0) and Emphysema (ICD10-J43.9). Electronically Signed   By: Sriyesh  Krishnan M.D.   On: 06/27/2017 15:33    ASSESSMENT AND PLAN:  This is a very pleasant 75 years old white female with recurrent non-small cell lung cancer, adenocarcinoma diagnosed in March 2008 status post several chemotherapy regimens and was on treatment with Tarceva for close to 4 years. The patient is currently on observation. For the last 9 months. She is feeling fine with no specific complaints. She had repeat CT scan of the chest performed recently that showed no evidence for disease progression. I discussed the scan results with the patient today and recommended for her to continue on observation with repeat CT scan of the chest in 3 months. She was advised to call immediately if she has any concerning symptoms in the interval. The patient voices understanding of current disease status and treatment options and is   in agreement with the current care plan. All questions were answered. The patient knows to call the clinic with any problems, questions or concerns. We can certainly see the patient much sooner if necessary. I spent 10 minutes counseling the patient face to face. The total time spent in the appointment was 15 minutes.  Disclaimer: This note was dictated with voice recognition software. Similar sounding  words can inadvertently be transcribed and may not be corrected upon review.

## 2017-08-29 ENCOUNTER — Ambulatory Visit (HOSPITAL_BASED_OUTPATIENT_CLINIC_OR_DEPARTMENT_OTHER): Payer: Medicare Other

## 2017-08-29 DIAGNOSIS — C3491 Malignant neoplasm of unspecified part of right bronchus or lung: Secondary | ICD-10-CM | POA: Diagnosis not present

## 2017-08-29 DIAGNOSIS — Z95828 Presence of other vascular implants and grafts: Secondary | ICD-10-CM

## 2017-08-29 DIAGNOSIS — Z452 Encounter for adjustment and management of vascular access device: Secondary | ICD-10-CM | POA: Diagnosis not present

## 2017-08-29 MED ORDER — HEPARIN SOD (PORK) LOCK FLUSH 100 UNIT/ML IV SOLN
500.0000 [IU] | Freq: Once | INTRAVENOUS | Status: AC | PRN
  Administered 2017-08-29: 500 [IU] via INTRAVENOUS
  Filled 2017-08-29: qty 5

## 2017-08-29 MED ORDER — SODIUM CHLORIDE 0.9 % IJ SOLN
10.0000 mL | INTRAMUSCULAR | Status: DC | PRN
  Administered 2017-08-29: 10 mL via INTRAVENOUS
  Filled 2017-08-29: qty 10

## 2017-10-01 ENCOUNTER — Other Ambulatory Visit: Payer: Medicare Other

## 2017-10-01 ENCOUNTER — Other Ambulatory Visit (HOSPITAL_BASED_OUTPATIENT_CLINIC_OR_DEPARTMENT_OTHER): Payer: Medicare Other

## 2017-10-01 ENCOUNTER — Ambulatory Visit (HOSPITAL_COMMUNITY)
Admission: RE | Admit: 2017-10-01 | Discharge: 2017-10-01 | Disposition: A | Discharge status: Home / Self Care | Payer: Medicare Other | Source: Ambulatory Visit | Attending: Internal Medicine | Admitting: Internal Medicine

## 2017-10-01 DIAGNOSIS — R0609 Other forms of dyspnea: Secondary | ICD-10-CM | POA: Insufficient documentation

## 2017-10-01 DIAGNOSIS — I517 Cardiomegaly: Secondary | ICD-10-CM | POA: Diagnosis not present

## 2017-10-01 DIAGNOSIS — C3491 Malignant neoplasm of unspecified part of right bronchus or lung: Secondary | ICD-10-CM | POA: Diagnosis not present

## 2017-10-01 DIAGNOSIS — J432 Centrilobular emphysema: Secondary | ICD-10-CM | POA: Diagnosis not present

## 2017-10-01 DIAGNOSIS — I700 Atherosclerosis of aorta: Secondary | ICD-10-CM | POA: Diagnosis not present

## 2017-10-01 DIAGNOSIS — J90 Pleural effusion, not elsewhere classified: Secondary | ICD-10-CM | POA: Diagnosis not present

## 2017-10-01 DIAGNOSIS — I2510 Atherosclerotic heart disease of native coronary artery without angina pectoris: Secondary | ICD-10-CM | POA: Insufficient documentation

## 2017-10-01 LAB — COMPREHENSIVE METABOLIC PANEL
ALBUMIN: 3.2 g/dL — AB (ref 3.5–5.0)
ALK PHOS: 71 U/L (ref 40–150)
ALT: 8 U/L (ref 0–55)
AST: 14 U/L (ref 5–34)
Anion Gap: 8 mEq/L (ref 3–11)
BUN: 31.7 mg/dL — ABNORMAL HIGH (ref 7.0–26.0)
CALCIUM: 9.5 mg/dL (ref 8.4–10.4)
CO2: 29 mEq/L (ref 22–29)
Chloride: 102 mEq/L (ref 98–109)
Creatinine: 1.8 mg/dL — ABNORMAL HIGH (ref 0.6–1.1)
EGFR: 27 mL/min/{1.73_m2} — AB (ref >60)
Glucose: 90 mg/dl (ref 70–140)
POTASSIUM: 4.2 meq/L (ref 3.5–5.1)
SODIUM: 140 meq/L (ref 136–145)
Total Bilirubin: 0.71 mg/dL (ref 0.20–1.20)
Total Protein: 6.6 g/dL (ref 6.4–8.3)

## 2017-10-01 LAB — CBC WITH DIFFERENTIAL/PLATELET
BASO%: 0.8 % (ref 0.0–2.0)
Basophils Absolute: 0 10*3/uL (ref 0.0–0.1)
EOS%: 3 % (ref 0.0–7.0)
Eosinophils Absolute: 0.2 10*3/uL (ref 0.0–0.5)
HCT: 36.9 % (ref 34.8–46.6)
HGB: 12.5 g/dL (ref 11.6–15.9)
LYMPH#: 0.6 10*3/uL — AB (ref 0.9–3.3)
LYMPH%: 11.7 % — ABNORMAL LOW (ref 14.0–49.7)
MCH: 32.2 pg (ref 25.1–34.0)
MCHC: 33.9 g/dL (ref 31.5–36.0)
MCV: 95 fL (ref 79.5–101.0)
MONO#: 0.6 10*3/uL (ref 0.1–0.9)
MONO%: 10.7 % (ref 0.0–14.0)
NEUT%: 73.8 % (ref 38.4–76.8)
NEUTROS ABS: 4 10*3/uL (ref 1.5–6.5)
PLATELETS: 200 10*3/uL (ref 145–400)
RBC: 3.88 10*6/uL (ref 3.70–5.45)
RDW: 13.3 % (ref 11.2–14.5)
WBC: 5.4 10*3/uL (ref 3.9–10.3)

## 2017-10-03 ENCOUNTER — Ambulatory Visit: Payer: Medicare Other | Admitting: Internal Medicine

## 2017-10-04 ENCOUNTER — Telehealth: Payer: Self-pay | Admitting: Internal Medicine

## 2017-10-04 NOTE — Telephone Encounter (Signed)
Patient came in to reschedule her appointment that she missed and also schedule a flush

## 2017-10-23 ENCOUNTER — Ambulatory Visit (HOSPITAL_BASED_OUTPATIENT_CLINIC_OR_DEPARTMENT_OTHER): Payer: Medicare Other

## 2017-10-23 DIAGNOSIS — Z452 Encounter for adjustment and management of vascular access device: Secondary | ICD-10-CM | POA: Diagnosis not present

## 2017-10-23 DIAGNOSIS — C3491 Malignant neoplasm of unspecified part of right bronchus or lung: Secondary | ICD-10-CM

## 2017-10-23 DIAGNOSIS — Z95828 Presence of other vascular implants and grafts: Secondary | ICD-10-CM

## 2017-10-23 MED ORDER — SODIUM CHLORIDE 0.9 % IJ SOLN
10.0000 mL | INTRAMUSCULAR | Status: DC | PRN
Start: 2017-10-23 — End: 2017-10-23
  Administered 2017-10-23: 10 mL via INTRAVENOUS
  Filled 2017-10-23: qty 10

## 2017-10-23 MED ORDER — HEPARIN SOD (PORK) LOCK FLUSH 100 UNIT/ML IV SOLN
500.0000 [IU] | Freq: Once | INTRAVENOUS | Status: AC | PRN
  Administered 2017-10-23: 500 [IU] via INTRAVENOUS
  Filled 2017-10-23: qty 5

## 2017-10-24 ENCOUNTER — Encounter: Payer: Self-pay | Admitting: Internal Medicine

## 2017-10-24 ENCOUNTER — Telehealth: Payer: Self-pay | Admitting: Internal Medicine

## 2017-10-24 ENCOUNTER — Ambulatory Visit: Payer: Medicare Other | Admitting: Internal Medicine

## 2017-10-24 DIAGNOSIS — I10 Essential (primary) hypertension: Secondary | ICD-10-CM

## 2017-10-24 DIAGNOSIS — C3490 Malignant neoplasm of unspecified part of unspecified bronchus or lung: Secondary | ICD-10-CM

## 2017-10-24 DIAGNOSIS — C3491 Malignant neoplasm of unspecified part of right bronchus or lung: Secondary | ICD-10-CM

## 2017-10-24 NOTE — Telephone Encounter (Signed)
Scheduled appt per 11/28 los - Gave patient AVS and calender per los. Central radiology to contact patient with ct schedule.

## 2017-10-24 NOTE — Progress Notes (Signed)
[place] [phone]   [phone]) [phone]  OFFICE PROGRESS NOTE  [name], MD [place] [address]  DIAGNOSIS: Recurrent non-small cell lung cancer, adenocarcinoma with negative EGFR and ALK mutations initially diagnosed as a stage IIA in March 2008.  PRIOR THERAPY: 1. Status post concurrent chemoradiation with weekly carboplatin and paclitaxel. Last dose was given Apr 12, 2007. 2. Status post 3 cycles of consolidation chemotherapy with docetaxel. Last dose was given July 12, 2007. 3. 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 4. Status post 39 cycles of maintenance Alimta at a dose of 500 mg/m2. Last dose was given February 25, 2010, discontinued secondary to persistent anemia, but the patient has stable disease. 5. Tarceva 150 mg by mouth daily. Status post approximately 37 months of therapy.   6. Tarceva 100 mg by mouth daily, status post 13 months of treatment.  CURRENT THERAPY: Observation.  INTERVAL HISTORY: Diane [name] 76 y.o. female returns to the clinic today for follow-up visit.  The patient has been in observation now for several months and has been doing fine.  She denied having any recent weight loss or night sweats.  She denied having any chest pain, shortness of breath, cough or hemoptysis.  She denied having any fever or chills.  She has no nausea, vomiting, diarrhea or constipation.  She had a repeat CT scan of the chest performed recently and she is here for evaluation and discussion of her scan results.  MEDICAL HISTORY: Past Medical History:  Diagnosis Date  . Acute bronchitis 10/16/2016  . Hypertension   . lung ca dx'd 02/2010  . 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 Renal insufficiency    stage 3 renal disease/ pt    ALLERGIES:  is allergic to septra ds [sulfamethoxazole-trimethoprim].  MEDICATIONS:  Current  Outpatient Medications  Medication Sig Dispense Refill  . aspirin 81 MG tablet Take 81 mg by mouth daily.      . calcitRIOL (ROCALTROL) 0.25 MCG capsule Take 0.25 mcg by mouth every Monday, Wednesday, and Friday.    . labetalol (NORMODYNE) 200 MG tablet Take 200 mg by mouth 2 (two) times daily. Take 1 1/2 pills two times daily    . polyethylene glycol (MIRALAX / GLYCOLAX) packet Take 17 g by mouth daily. Reported on 06/05/2016    . torsemide (DEMADEX) 20 MG tablet Take 40 mg by mouth 2 (two) times daily.      No current facility-administered medications for this visit.    Facility-Administered Medications Ordered in Other Visits  Medication Dose Route Frequency Provider Last Rate Last Dose  . sodium chloride 0.9 % injection 10 mL  10 mL Intravenous PRN Curt Bears, MD   10 mL at 10/14/13 1030    SURGICAL HISTORY:  Past Surgical History:  Procedure Laterality Date  . PAROTID GLAND TUMOR EXCISION Left   . PORTACATH PLACEMENT  11/03/08-[place]   tip in mid SVC-[facility]  . THORACENTESIS Right   . TONSILLECTOMY      REVIEW OF SYSTEMS:  A comprehensive review of systems was negative.   PHYSICAL EXAMINATION: General appearance: alert, cooperative and no distress Head: Normocephalic, without obvious abnormality, atraumatic Neck: no adenopathy, no JVD, supple, symmetrical, trachea midline and thyroid not enlarged, symmetric, no tenderness/mass/nodules Lymph nodes: Cervical, supraclavicular, and axillary nodes normal. Resp: clear to auscultation bilaterally Back: symmetric, no curvature. ROM normal. No CVA  tenderness. Cardio: regular rate and rhythm, S1, S2 normal, no murmur, click, rub or gallop GI: soft, non-tender; bowel sounds normal; no masses,  no organomegaly Extremities: extremities normal, atraumatic, no cyanosis or edema  ECOG PERFORMANCE STATUS: 1 - Symptomatic but completely ambulatory  Blood pressure (!) 140/52, pulse 93, temperature 98.2 F (36.8 C), temperature  source Oral, resp. rate 18, height _0  (1.651 m), weight 255 lb 12.8 oz (116 kg), SpO2 97 %.  LABORATORY DATA: Lab Results  Component Value Date   WBC 5.4 10/01/2017   HGB 12.5 10/01/2017   HCT 36.9 10/01/2017   MCV 95.0 10/01/2017   PLT 200 10/01/2017      Chemistry      Component Value Date/Time   NA 140 10/01/2017 1116   K 4.2 10/01/2017 1116   CL 102 05/08/2013 0851   CO2 29 10/01/2017 1116   BUN 31.7 (H) 10/01/2017 1116   CREATININE 1.8 (H) 10/01/2017 1116      Component Value Date/Time   CALCIUM 9.5 10/01/2017 1116   ALKPHOS 71 10/01/2017 1116   AST 14 10/01/2017 1116   ALT 8 10/01/2017 1116   BILITOT 0.71 10/01/2017 1116       RADIOGRAPHIC STUDIES: Ct Chest Wo Contrast  Result Date: 10/01/2017 CLINICAL DATA:  Right-sided lung cancer. Dyspnea on exertion. Locoregional recurrence. EXAM: CT CHEST WITHOUT CONTRAST TECHNIQUE: Multidetector CT imaging of the chest was performed following the standard protocol without IV contrast. COMPARISON:  06/27/2017 FINDINGS: Cardiovascular: A left-sided Port-A-Cath which terminates at the mid SVC. Aortic and branch vessel atherosclerosis. Tortuous thoracic aorta. Mild cardiomegaly, without pericardial effusion. Multivessel coronary artery atherosclerosis. Mediastinum/Nodes: No supraclavicular adenopathy. Small low left jugular nodes are not significantly changed. No mediastinal adenopathy. Hilar regions not well evaluated secondary to lack of IV contrast and right-sided radiation change. Lungs/Pleura: Complex moderate right-sided pleural effusion with pleural calcifications, similar. No pneumothorax. No left-sided pleural fluid. Similar right-sided airway distortion secondary to surrounding radiation change. Moderate centrilobular emphysema. Right apical branching nodularity is similar, including on image 32/series 5. Other areas of clustered nodularity throughout the remainder of the right lower lobe are also not significantly changed.  Similar distribution and morphology of right perihilar presumed radiation induced fibrosis. Volume loss in the right lower lobe with chronic consolidation. Areas of vague ground-glass nodularity in the left apex are unchanged, including on image 22/series 5 A 4 mm left lower lobe pulmonary nodule is similar on image 88/ series 5. Upper Abdomen: Normal imaged portions of the liver, spleen, stomach, pancreas, adrenal glands. Musculoskeletal: Osteopenia.  Moderate thoracic spondylosis. IMPRESSION: 1. No significant change compared to 06/27/2017. 2. Similar appearance of presumed radiation fibrosis in the perihilar right lung. Nodularity within both lungs is not significantly changed. 3. Cardiomegaly. Coronary artery atherosclerosis. Aortic Atherosclerosis (ICD10-I70.0). 4. Chronic complex right-sided pleural effusion. 5.  Emphysema (ICD10-J43.9). Electronically Signed   By: Abigail Miyamoto M.D.   On: 10/01/2017 15:18    ASSESSMENT AND PLAN:  This is a very pleasant 76 years old white female with recurrent non-small cell lung cancer, adenocarcinoma diagnosed in March 2008 status post several chemotherapy regimens and was on treatment with Tarceva for close to 4 years. The patient has been in observation now for more than a year and she is doing very well with no concerning complaints. The recent CT scan of the chest showed no evidence for disease progression. I discussed the scan results with the patient and recommended for her to continue in observation with repeat CT scan  of the chest in 3 months. She will have a Port-A-Cath flush every 6 weeks. She was advised to call immediately if she has any concerning symptoms in the interval. The patient voices understanding of current disease status and treatment options and is in agreement with the current care plan. All questions were answered. The patient knows to call the clinic with any problems, questions or concerns. We can certainly see the patient much sooner  if necessary. I spent 10 minutes counseling the patient face to face. The total time spent in the appointment was 15 minutes.  Disclaimer: This note was dictated with voice recognition software. Similar sounding words can inadvertently be transcribed and may not be corrected upon review.

## 2017-12-03 ENCOUNTER — Encounter: Payer: Self-pay | Admitting: Cardiology

## 2017-12-03 ENCOUNTER — Inpatient Hospital Stay (HOSPITAL_COMMUNITY)
Admission: AD | Admit: 2017-12-03 | Discharge: 2017-12-07 | DRG: 308 | Disposition: A | Discharge status: Home / Self Care | Payer: Medicare Other | Source: Ambulatory Visit | Attending: Cardiology | Admitting: Cardiology

## 2017-12-03 ENCOUNTER — Other Ambulatory Visit: Payer: Self-pay

## 2017-12-03 DIAGNOSIS — I119 Hypertensive heart disease without heart failure: Secondary | ICD-10-CM

## 2017-12-03 DIAGNOSIS — I517 Cardiomegaly: Secondary | ICD-10-CM | POA: Diagnosis present

## 2017-12-03 DIAGNOSIS — I481 Persistent atrial fibrillation: Principal | ICD-10-CM | POA: Diagnosis present

## 2017-12-03 DIAGNOSIS — N179 Acute kidney failure, unspecified: Secondary | ICD-10-CM | POA: Diagnosis present

## 2017-12-03 DIAGNOSIS — I5033 Acute on chronic diastolic (congestive) heart failure: Secondary | ICD-10-CM | POA: Diagnosis present

## 2017-12-03 DIAGNOSIS — Z825 Family history of asthma and other chronic lower respiratory diseases: Secondary | ICD-10-CM | POA: Diagnosis not present

## 2017-12-03 DIAGNOSIS — Z8249 Family history of ischemic heart disease and other diseases of the circulatory system: Secondary | ICD-10-CM

## 2017-12-03 DIAGNOSIS — I700 Atherosclerosis of aorta: Secondary | ICD-10-CM

## 2017-12-03 DIAGNOSIS — I130 Hypertensive heart and chronic kidney disease with heart failure and stage 1 through stage 4 chronic kidney disease, or unspecified chronic kidney disease: Secondary | ICD-10-CM | POA: Diagnosis present

## 2017-12-03 DIAGNOSIS — C3411 Malignant neoplasm of upper lobe, right bronchus or lung: Secondary | ICD-10-CM | POA: Diagnosis present

## 2017-12-03 DIAGNOSIS — Z6841 Body Mass Index (BMI) 40.0 and over, adult: Secondary | ICD-10-CM | POA: Diagnosis not present

## 2017-12-03 DIAGNOSIS — Z87891 Personal history of nicotine dependence: Secondary | ICD-10-CM

## 2017-12-03 DIAGNOSIS — N183 Chronic kidney disease, stage 3 unspecified: Secondary | ICD-10-CM | POA: Insufficient documentation

## 2017-12-03 DIAGNOSIS — N189 Chronic kidney disease, unspecified: Secondary | ICD-10-CM

## 2017-12-03 DIAGNOSIS — I4819 Other persistent atrial fibrillation: Secondary | ICD-10-CM | POA: Insufficient documentation

## 2017-12-03 DIAGNOSIS — D649 Anemia, unspecified: Secondary | ICD-10-CM | POA: Diagnosis present

## 2017-12-03 DIAGNOSIS — I2510 Atherosclerotic heart disease of native coronary artery without angina pectoris: Secondary | ICD-10-CM

## 2017-12-03 DIAGNOSIS — Z833 Family history of diabetes mellitus: Secondary | ICD-10-CM | POA: Diagnosis not present

## 2017-12-03 DIAGNOSIS — Z7901 Long term (current) use of anticoagulants: Secondary | ICD-10-CM

## 2017-12-03 DIAGNOSIS — Z803 Family history of malignant neoplasm of breast: Secondary | ICD-10-CM | POA: Diagnosis not present

## 2017-12-03 DIAGNOSIS — Z923 Personal history of irradiation: Secondary | ICD-10-CM | POA: Diagnosis not present

## 2017-12-03 DIAGNOSIS — Z902 Acquired absence of lung [part of]: Secondary | ICD-10-CM

## 2017-12-03 DIAGNOSIS — I4510 Unspecified right bundle-branch block: Secondary | ICD-10-CM | POA: Diagnosis present

## 2017-12-03 DIAGNOSIS — E876 Hypokalemia: Secondary | ICD-10-CM | POA: Diagnosis present

## 2017-12-03 DIAGNOSIS — I4891 Unspecified atrial fibrillation: Secondary | ICD-10-CM

## 2017-12-03 DIAGNOSIS — Z9221 Personal history of antineoplastic chemotherapy: Secondary | ICD-10-CM

## 2017-12-03 DIAGNOSIS — I5032 Chronic diastolic (congestive) heart failure: Secondary | ICD-10-CM | POA: Insufficient documentation

## 2017-12-03 HISTORY — DX: Personal history of other medical treatment: Z92.89

## 2017-12-03 HISTORY — DX: Chronic diastolic (congestive) heart failure: I50.32

## 2017-12-03 HISTORY — DX: Atherosclerosis of aorta: I70.0

## 2017-12-03 HISTORY — DX: Hypertensive heart disease without heart failure: I11.9

## 2017-12-03 HISTORY — DX: Atherosclerotic heart disease of native coronary artery without angina pectoris: I25.10

## 2017-12-03 HISTORY — DX: Malignant neoplasm of unspecified part of unspecified bronchus or lung: C34.90

## 2017-12-03 HISTORY — DX: Gastro-esophageal reflux disease without esophagitis: K21.9

## 2017-12-03 HISTORY — DX: Other disorders of lung: J98.4

## 2017-12-03 HISTORY — DX: Chronic kidney disease, stage 3 (moderate): N18.3

## 2017-12-03 HISTORY — DX: Morbid (severe) obesity due to excess calories: E66.01

## 2017-12-03 HISTORY — DX: Other persistent atrial fibrillation: I48.19

## 2017-12-03 HISTORY — DX: Heart failure, unspecified: I50.9

## 2017-12-03 HISTORY — DX: Nonrheumatic mitral (valve) prolapse: I34.1

## 2017-12-03 HISTORY — DX: Chronic kidney disease, stage 3 unspecified: N18.30

## 2017-12-03 HISTORY — DX: Cardiac murmur, unspecified: R01.1

## 2017-12-03 LAB — CBC WITH DIFFERENTIAL/PLATELET
Basophils Absolute: 0 10*3/uL (ref 0.0–0.1)
Basophils Relative: 0 %
EOS ABS: 0.1 10*3/uL (ref 0.0–0.7)
EOS PCT: 2 %
HCT: 36.6 % (ref 36.0–46.0)
Hemoglobin: 11.6 g/dL — ABNORMAL LOW (ref 12.0–15.0)
LYMPHS ABS: 0.9 10*3/uL (ref 0.7–4.0)
LYMPHS PCT: 14 %
MCH: 30.9 pg (ref 26.0–34.0)
MCHC: 31.7 g/dL (ref 30.0–36.0)
MCV: 97.6 fL (ref 78.0–100.0)
MONOS PCT: 9 %
Monocytes Absolute: 0.5 10*3/uL (ref 0.1–1.0)
Neutro Abs: 4.7 10*3/uL (ref 1.7–7.7)
Neutrophils Relative %: 75 %
PLATELETS: 212 10*3/uL (ref 150–400)
RBC: 3.75 MIL/uL — ABNORMAL LOW (ref 3.87–5.11)
RDW: 13.9 % (ref 11.5–15.5)
WBC: 6.3 10*3/uL (ref 4.0–10.5)

## 2017-12-03 LAB — COMPREHENSIVE METABOLIC PANEL
ALT: 57 U/L — AB (ref 14–54)
ANION GAP: 10 (ref 5–15)
AST: 41 U/L (ref 15–41)
Albumin: 3.1 g/dL — ABNORMAL LOW (ref 3.5–5.0)
Alkaline Phosphatase: 84 U/L (ref 38–126)
BUN: 27 mg/dL — ABNORMAL HIGH (ref 6–20)
CHLORIDE: 98 mmol/L — AB (ref 101–111)
CO2: 30 mmol/L (ref 22–32)
CREATININE: 2.37 mg/dL — AB (ref 0.44–1.00)
Calcium: 9.1 mg/dL (ref 8.9–10.3)
GFR, EST AFRICAN AMERICAN: 22 mL/min — AB (ref >60)
GFR, EST NON AFRICAN AMERICAN: 19 mL/min — AB (ref >60)
Glucose, Bld: 148 mg/dL — ABNORMAL HIGH (ref 65–99)
POTASSIUM: 3.4 mmol/L — AB (ref 3.5–5.1)
SODIUM: 138 mmol/L (ref 135–145)
Total Bilirubin: 1.1 mg/dL (ref 0.3–1.2)
Total Protein: 6.4 g/dL — ABNORMAL LOW (ref 6.5–8.1)

## 2017-12-03 LAB — APTT: APTT: 35 s (ref 24–36)

## 2017-12-03 LAB — PROTIME-INR
INR: 1.7
PROTHROMBIN TIME: 19.8 s — AB (ref 11.4–15.2)

## 2017-12-03 MED ORDER — ACETAMINOPHEN 325 MG PO TABS: 650.0000 mg | ORAL_TABLET | ORAL | Status: DC | PRN

## 2017-12-03 MED ORDER — SODIUM CHLORIDE 0.9% FLUSH
3.0000 mL | Freq: Two times a day (BID) | INTRAVENOUS | Status: DC
  Administered 2017-12-03 – 2017-12-06 (×2): 3 mL via INTRAVENOUS

## 2017-12-03 MED ORDER — FUROSEMIDE 10 MG/ML IJ SOLN
80.0000 mg | Freq: Three times a day (TID) | INTRAMUSCULAR | Status: DC
  Administered 2017-12-03 – 2017-12-05 (×5): 80 mg via INTRAVENOUS
  Filled 2017-12-03 (×5): qty 8

## 2017-12-03 MED ORDER — CARVEDILOL 25 MG PO TABS: 25.0000 mg | ORAL_TABLET | Freq: Two times a day (BID) | ORAL | Status: DC

## 2017-12-03 MED ORDER — SODIUM CHLORIDE 0.9 % IV SOLN
250.0000 mL | INTRAVENOUS | Status: DC | PRN
  Administered 2017-12-06: 11:00:00 via INTRAVENOUS

## 2017-12-03 MED ORDER — SODIUM CHLORIDE 0.9% FLUSH
10.0000 mL | INTRAVENOUS | Status: DC | PRN
  Administered 2017-12-04 – 2017-12-07 (×2): 10 mL
  Filled 2017-12-03 (×2): qty 40

## 2017-12-03 MED ORDER — CARVEDILOL 25 MG PO TABS
25.0000 mg | ORAL_TABLET | Freq: Two times a day (BID) | ORAL | Status: DC
  Administered 2017-12-03 – 2017-12-07 (×7): 25 mg via ORAL
  Filled 2017-12-03 (×8): qty 1

## 2017-12-03 MED ORDER — APIXABAN 5 MG PO TABS
5.0000 mg | ORAL_TABLET | Freq: Two times a day (BID) | ORAL | Status: DC
  Administered 2017-12-03 – 2017-12-07 (×8): 5 mg via ORAL
  Filled 2017-12-03 (×8): qty 1

## 2017-12-03 MED ORDER — POTASSIUM CHLORIDE CRYS ER 20 MEQ PO TBCR
20.0000 meq | EXTENDED_RELEASE_TABLET | Freq: Once | ORAL | Status: AC
  Administered 2017-12-03: 20 meq via ORAL
  Filled 2017-12-03: qty 1

## 2017-12-03 MED ORDER — AMIODARONE HCL 200 MG PO TABS
200.0000 mg | ORAL_TABLET | Freq: Two times a day (BID) | ORAL | Status: DC
  Administered 2017-12-03 – 2017-12-07 (×8): 200 mg via ORAL
  Filled 2017-12-03 (×8): qty 1

## 2017-12-03 MED ORDER — ONDANSETRON HCL 4 MG/2ML IJ SOLN: 4.0000 mg | Freq: Four times a day (QID) | INTRAMUSCULAR | Status: DC | PRN

## 2017-12-03 MED ORDER — DILTIAZEM HCL ER COATED BEADS 240 MG PO CP24
240.0000 mg | ORAL_CAPSULE | Freq: Every day | ORAL | Status: DC
  Administered 2017-12-03 – 2017-12-06 (×4): 240 mg via ORAL
  Filled 2017-12-03 (×4): qty 1

## 2017-12-03 MED ORDER — SODIUM CHLORIDE 0.9% FLUSH: 3.0000 mL | INTRAVENOUS | Status: DC | PRN

## 2017-12-03 NOTE — Progress Notes (Signed)
Patient arrived to [facility] via transport. Patient alert and oriented. Admission assessment complete. CCMD notified. Patient educated on plan of care. Verbalized understanding. No questions at this time. Will continue to monitor patient.

## 2017-12-03 NOTE — Progress Notes (Addendum)
Paged On-call cardiologist Dr Kenton Kingfisher of patient's potassium 3.4. Verbal order to give 52meq of potassium PO once. Order initiated. Will continue to monitor patient.

## 2017-12-03 NOTE — H&P (Signed)
Diane [name]    Date of visit:  12/03/2017 DOB:  [birth year]    Age:  77 yrs. Medical record number:  [id number]     Account number:  [id number] Primary Care Provider: Claris Gower ____________________________ CURRENT DIAGNOSES  1. Persistent atrial fibrillation  2. Chronic diastolic heart failure  3. Dyspnea  4. Atherosclerosis of aorta  5. Chronic kidney disease, stage 3 (moderate)  6. Hypertensive heart disease without heart failure  7. CAD Native without angina  8. Long term (current) use of anticoagulants  9. Malignant Neoplasm Of Upper Lobe, Unspecified Bronchus Or Lung  10. Cardiomegaly ____________________________ ALLERGIES  No Known Drug Allergies ____________________________ MEDICATIONS  1. calcitriol 0.25 mcg capsule, MWF  2. carvedilol 25 mg tablet, BID  3. dextromethorphan polistirex ER 30 mg/5 mL oral susp ext.release 12hr, PRN  4. diltiazem ER 180 mg tablet,extended release 24 hr, 1 p.o. daily  5. Eliquis 5 mg tablet, BID  6. melatonin 5 mg tablet, 1 PO daily  7. torsemide 20 mg tablet, take 5 in the morning and 3 in the afternoon ____________________________ HISTORY OF PRESENT ILLNESS Patient returns for cardiac followup. Since she was previously here she called right before Christmas with continued swelling and has not been able to lose any significant weight. Torsemide has been increased to 80 mg in the morning and the afternoon. Despite this she has not been able to lose any weight and continues to have some PND severe dyspnea with on exertion and other issues. She has been taking Eliquis for the past 3 weeks and has not lost any significant weight. She doesn't have anginal type pain. Her echocardiogram done previously shows concentric LVH with an EF of around 60% and mild left atrial enlargement. She is really not feeling well at the present time. ____________________________ PAST HISTORY  Past Medical Illnesses:  hypertension, obesity, chronic kidney disease  Stage 3, lung cancer treated with radiation and chemotherapy;  Cardiovascular Illnesses:  CAD, atrial fibrillation-paroxysmal;  Infectious Diseases:  no previous history of significant infectious diseases;  Surgical Procedures:  left parotid tumor resection, lung biopsy, tonsillectomy;  Trauma History:  no previous history of significant trauma;  NYHA Classification:  II;  Canadian Angina Classification:  Class 0: Asymptomatic;  Cardiology Procedures-Invasive:  no previous interventional or invasive cardiology procedures;  Cardiology Procedures-Noninvasive:  echocardiogram December 2018;  Peripheral Vascular Procedures:  no previous invasive peripheral vascular procedures.;  LVEF of 60% documented via echocardiogram on 11/26/2017,   CHA2DS2-VASC Score:  4 ____________________________ CARDIO-PULMONARY TEST DATES EKG Date:  11/22/2017;  Nuclear Study Date:  03/24/2017;  Echocardiography Date: 11/26/2017;   ____________________________ FAMILY HISTORY Brother -- Brother dead Brother -- Brother alive with problem, Diabetes mellitus Father -- Father dead, Diabetes mellitus, Heart disease Mother -- Mother dead, Congestive heart failure ____________________________ SOCIAL HISTORY Alcohol Use:  occasionally and mixed drinks;  Smoking:  used to smoke but quit 1998, 40 pack year history;  Diet:  regular diet;  Lifestyle:  married and 1 daughter;  Exercise:  no regular exercise;  Residence:  lives with husband;   ____________________________ REVIEW OF SYSTEMS General:  obesity, malaise and fatigue  Integumentary:no rashes or new skin lesions. Eyes: wears eye glasses/contact lenses, denies diplopia, glaucoma or visual field defects. Ears, Nose, Throat, Mouth:  seasonal sinusitis Respiratory: dyspnea with exertion Cardiovascular:  please review HPI Abdominal: constipationGenitourinary-Female: frequency Musculoskeletal:  arthritis of the thumb Neurological:  denies headaches, stroke, or TIA Psychiatric:  denies  depression or anxiety  ____________________________ PHYSICAL EXAMINATION VITAL SIGNS  Blood Pressure:  110/60 Sitting, Right arm, large cuff  , 120/64 Standing, Right arm and large cuff   Pulse:  88/min. Weight:  262.00 lbs. Height:  67.00"BMI: 41  Constitutional:  pleasant white female, in no acute distress, moderately obese Skin:  warm and dry to touch, no apparent skin lesions, or masses noted. Head:  normocephalic, normal hair pattern, no masses or tenderness Eyes:  EOMS Intact, PERRLA, C and S clear, Funduscopic exam not done. ENT:  ears, nose and throat reveal no gross abnormalities.  Dentition good. Neck:  supple, without massess. No JVD, thyromegaly or carotid bruits. Carotid upstroke normal. Chest:  normal symmetry, reduced BS in right base, clear on left Cardiac:  irregularly irregular rhythm, normal S1 and S2, no S3 or S4, no murmur Abdomen:  abdomen soft,non-tender, no masses, no hepatospenomegaly, or aneurysm noted Peripheral Pulses:  the femoral,dorsalis pedis, and posterior tibial pulses are full and equal bilaterally with no bruits auscultated. Extremities & Back:  2+ edema, erythema on lower legs Psychiatric:  oriented to time, place, and person Neurological:  no gross motor or sensory deficits noted, affect appropriate, oriented x3. ____________________________ IMPRESSIONS/PLAN  1. Persistent atrial fibrillation which is symptomatic 2. Acute and chronic diastolic heart failure with cellular to diurese despite high-dose torsemide 3. Hypertensive heart disease 4. History of lung cancer treated with chemotherapy and radiation therapy 5. Long-term use of anticoagulation without missing doses 6. Obesity with need to lose weight  Recommendations:  Discuss options of admission to hospital for intravenous diuresis, initiation of amiodarone and cardioversion. She has continued to be symptomatic with refractory diuresis. The hospital was really backed up we will try to get her  in as soon as we can to make this happen. ____________________________ Cardiology Physician:  Kerry Hough MD [facility]

## 2017-12-04 ENCOUNTER — Encounter (HOSPITAL_COMMUNITY): Payer: Self-pay

## 2017-12-04 ENCOUNTER — Inpatient Hospital Stay (HOSPITAL_COMMUNITY): Payer: Medicare Other

## 2017-12-04 LAB — BASIC METABOLIC PANEL
ANION GAP: 10 (ref 5–15)
BUN: 28 mg/dL — AB (ref 6–20)
CHLORIDE: 99 mmol/L — AB (ref 101–111)
CO2: 30 mmol/L (ref 22–32)
Calcium: 8.8 mg/dL — ABNORMAL LOW (ref 8.9–10.3)
Creatinine, Ser: 2.21 mg/dL — ABNORMAL HIGH (ref 0.44–1.00)
GFR calc Af Amer: 24 mL/min — ABNORMAL LOW (ref >60)
GFR calc non Af Amer: 20 mL/min — ABNORMAL LOW (ref >60)
GLUCOSE: 120 mg/dL — AB (ref 65–99)
POTASSIUM: 3.3 mmol/L — AB (ref 3.5–5.1)
Sodium: 139 mmol/L (ref 135–145)

## 2017-12-04 LAB — BRAIN NATRIURETIC PEPTIDE: B Natriuretic Peptide: 494.7 pg/mL — ABNORMAL HIGH (ref 0.0–100.0)

## 2017-12-04 MED ORDER — METOLAZONE 2.5 MG PO TABS
2.5000 mg | ORAL_TABLET | Freq: Once | ORAL | Status: AC
  Administered 2017-12-04: 2.5 mg via ORAL
  Filled 2017-12-04: qty 1

## 2017-12-04 MED ORDER — POTASSIUM CHLORIDE 20 MEQ/15ML (10%) PO SOLN
40.0000 meq | Freq: Once | ORAL | Status: AC
  Administered 2017-12-04: 40 meq via ORAL
  Filled 2017-12-04: qty 30

## 2017-12-04 NOTE — Progress Notes (Signed)
Subjective:  Admitted late last night.  Still dyspneic with exertion.  Atrial fibrillation rate is somewhat better controlled.  Hypokalemic again today.  Objective:  Vital Signs in the last 24 hours: BP 120/62 (BP Location: Right Arm)   Pulse 98   Temp 97.8 F (36.6 C) (Oral)   Resp 18   Ht 5\' 5"  (1.651 m)   Wt 118.1 kg (260 lb 4.8 oz) Comment: a scale  SpO2 98%   BMI 43.32 kg/m   Physical Exam: Obese female currently in no acute distress Lungs:  Reduced breath sounds at the right base, faint crackles on left Cardiac:  Irregular rhythm, normal S1 and S2, no S3 Abdomen:  Soft, nontender, no masses Extremities:  2+ edema with some erythema  Intake/Output from previous day: 01/07 0701 - 01/08 0700 In: -  Out: 250 [Urine:250] Weight Filed Weights   12/03/17 1938 12/04/17 0635  Weight: 118.2 kg (260 lb 8 oz) 118.1 kg (260 lb 4.8 oz)    Lab Results: Basic Metabolic Panel: Recent Labs    12/03/17 2104 12/04/17 0406  NA 138 139  K 3.4* 3.3*  CL 98* 99*  CO2 30 30  GLUCOSE 148* 120*  BUN 27* 28*  CREATININE 2.37* 2.21*    CBC: Recent Labs    12/03/17 2104  WBC 6.3  NEUTROABS 4.7  HGB 11.6*  HCT 36.6  MCV 97.6  PLT 212   Telemetry: Atrial fibrillation with controlled response today but rapid on admission.  Personally reviewed  Assessment/Plan:  1.  Persistent atrial fibrillation with rapid response better controlled today 2.  Acute on chronic diastolic heart failure 3.  Stage III chronic kidney disease 4.  Hypokalemia 5.  History of lung cancer under treatment  Recommendations:  Amiodarone has been begun and we'll plan cardioversion on Thursday.  Cardioversion discussed with patient including risks and she is agreeable to proceed.  She has not missed any doses of anticoagulation.  She has been on high-dose torsemide as an outpatient and will check BNP and add Zaroxolyn.  Hopefully with restoration of sinus rhythm hemodynamics: Improved.  Follow renal  function closely.     Kerry Hough  MD [facility]  12/04/2017, 1:25 PM

## 2017-12-05 ENCOUNTER — Encounter (HOSPITAL_COMMUNITY): Payer: Self-pay

## 2017-12-05 LAB — BASIC METABOLIC PANEL
Anion gap: 10 (ref 5–15)
BUN: 35 mg/dL — AB (ref 6–20)
CHLORIDE: 95 mmol/L — AB (ref 101–111)
CO2: 31 mmol/L (ref 22–32)
CREATININE: 2.48 mg/dL — AB (ref 0.44–1.00)
Calcium: 9.1 mg/dL (ref 8.9–10.3)
GFR, EST AFRICAN AMERICAN: 20 mL/min — AB (ref >60)
GFR, EST NON AFRICAN AMERICAN: 18 mL/min — AB (ref >60)
Glucose, Bld: 124 mg/dL — ABNORMAL HIGH (ref 65–99)
Potassium: 3.2 mmol/L — ABNORMAL LOW (ref 3.5–5.1)
SODIUM: 136 mmol/L (ref 135–145)

## 2017-12-05 MED ORDER — POTASSIUM CHLORIDE CRYS ER 20 MEQ PO TBCR
40.0000 meq | EXTENDED_RELEASE_TABLET | Freq: Two times a day (BID) | ORAL | Status: DC
  Administered 2017-12-05 – 2017-12-07 (×5): 40 meq via ORAL
  Filled 2017-12-05 (×5): qty 2

## 2017-12-05 MED ORDER — FUROSEMIDE 40 MG PO TABS: 40.0000 mg | ORAL_TABLET | Freq: Two times a day (BID) | ORAL | Status: DC

## 2017-12-05 MED ORDER — FUROSEMIDE 10 MG/ML IJ SOLN
80.0000 mg | Freq: Two times a day (BID) | INTRAMUSCULAR | Status: DC
  Administered 2017-12-06: 80 mg via INTRAVENOUS
  Filled 2017-12-05 (×2): qty 8

## 2017-12-05 NOTE — Progress Notes (Signed)
Dr Parke Simmers paged and notified of patient's potassium of 3.2. Verbal  order received to order potassium 47meq BID. Order initiated. Report given to oncoming RN. Patient in recliner resting. No complaints at this time.

## 2017-12-05 NOTE — Progress Notes (Signed)
Subjective:  Still having difficulty sleeping at night.  Sitting up on side of bed without shortness of breath.  Still distended with exertion.  Atrial fibrillation rate is somewhat slower than before.  Currently on amiodarone.  Objective:  Vital Signs in the last 24 hours: BP 117/69 (BP Location: Right Arm)   Pulse 73   Temp 98 F (36.7 C) (Oral)   Resp 19   Ht 5\' 5"  (1.651 m)   Wt 118 kg (260 lb 1.6 oz) Comment: a scale; rn notified of the lack of weight change  SpO2 95%   BMI 43.28 kg/m   Physical Exam: Obese female currently in no acute distress Lungs:  Reduced breath sounds at the right base, faint crackles on left Cardiac:  Irregular rhythm, normal S1 and S2, no S3 Abdomen:  Soft, nontender, no masses Extremities:  2+ edema with some erythema  Intake/Output from previous day: 01/08 0701 - 01/09 0700 In: 730 [P.O.:720; I.V.:10] Out: 3050 [Urine:3050] Weight Filed Weights   12/03/17 1938 12/04/17 0635 12/05/17 0557  Weight: 118.2 kg (260 lb 8 oz) 118.1 kg (260 lb 4.8 oz) 118 kg (260 lb 1.6 oz)    Lab Results: Basic Metabolic Panel: Recent Labs    12/04/17 0406 12/05/17 0342  NA 139 136  K 3.3* 3.2*  CL 99* 95*  CO2 30 31  GLUCOSE 120* 124*  BUN 28* 35*  CREATININE 2.21* 2.48*    CBC: Recent Labs    12/03/17 2104  WBC 6.3  NEUTROABS 4.7  HGB 11.6*  HCT 36.6  MCV 97.6  PLT 212   Telemetry: Atrial fibrillation with controlled response today but rapid on admission.  Personally reviewed  Assessment/Plan:  1.  Persistent atrial fibrillation with rapid response better controlled today 2.  Acute on chronic diastolic heart failure 3.  Stage III chronic kidney disease creatinine somewhat worse today.  Uncertain whether there is some other etiology or whether she is over diuresed.  She does have a pleural effusion present on the right. 4.  Hypokalemia 5.  History of lung cancer under treatment currently with right pleural  effusion  Recommendations:  Continues on amiodarone.  Cardioversion planned tomorrow discussed yesterday.  Replete potassium.  Check renal ultrasound.   Kerry Hough  MD [facility]  12/05/2017, 8:59 AM

## 2017-12-05 NOTE — Progress Notes (Signed)
Refused bed alarm. Will continue to monitor.

## 2017-12-06 ENCOUNTER — Encounter (HOSPITAL_COMMUNITY)
Admission: AD | Disposition: A | Discharge status: Home / Self Care | Payer: Self-pay | Source: Ambulatory Visit | Attending: Cardiology

## 2017-12-06 ENCOUNTER — Inpatient Hospital Stay (HOSPITAL_COMMUNITY): Payer: Medicare Other | Admitting: Anesthesiology

## 2017-12-06 ENCOUNTER — Inpatient Hospital Stay (HOSPITAL_COMMUNITY): Payer: Medicare Other

## 2017-12-06 ENCOUNTER — Encounter (HOSPITAL_COMMUNITY): Payer: Self-pay | Admitting: *Deleted

## 2017-12-06 HISTORY — PX: CARDIOVERSION: SHX1299

## 2017-12-06 LAB — BASIC METABOLIC PANEL
ANION GAP: 12 (ref 5–15)
BUN: 43 mg/dL — ABNORMAL HIGH (ref 6–20)
CHLORIDE: 94 mmol/L — AB (ref 101–111)
CO2: 30 mmol/L (ref 22–32)
Calcium: 8.8 mg/dL — ABNORMAL LOW (ref 8.9–10.3)
Creatinine, Ser: 2.63 mg/dL — ABNORMAL HIGH (ref 0.44–1.00)
GFR calc non Af Amer: 16 mL/min — ABNORMAL LOW (ref >60)
GFR, EST AFRICAN AMERICAN: 19 mL/min — AB (ref >60)
Glucose, Bld: 126 mg/dL — ABNORMAL HIGH (ref 65–99)
POTASSIUM: 3.1 mmol/L — AB (ref 3.5–5.1)
SODIUM: 136 mmol/L (ref 135–145)

## 2017-12-06 LAB — URINALYSIS, ROUTINE W REFLEX MICROSCOPIC
BILIRUBIN URINE: NEGATIVE
Glucose, UA: NEGATIVE mg/dL
Hgb urine dipstick: NEGATIVE
Ketones, ur: NEGATIVE mg/dL
LEUKOCYTES UA: NEGATIVE
NITRITE: NEGATIVE
PH: 6 (ref 5.0–8.0)
Protein, ur: NEGATIVE mg/dL
SPECIFIC GRAVITY, URINE: 1.006 (ref 1.005–1.030)

## 2017-12-06 SURGERY — CARDIOVERSION
Anesthesia: General

## 2017-12-06 MED ORDER — SODIUM CHLORIDE 0.45 % IV SOLN: INTRAVENOUS | Status: DC

## 2017-12-06 MED ORDER — POTASSIUM CHLORIDE 20 MEQ/15ML (10%) PO SOLN
20.0000 meq | Freq: Once | ORAL | Status: AC
  Administered 2017-12-06: 20 meq via ORAL
  Filled 2017-12-06: qty 15

## 2017-12-06 MED ORDER — HYDROCORTISONE 1 % EX CREA
1.0000 "application " | TOPICAL_CREAM | Freq: Three times a day (TID) | CUTANEOUS | Status: DC | PRN
  Filled 2017-12-06: qty 28

## 2017-12-06 MED ORDER — PROPOFOL 10 MG/ML IV BOLUS
INTRAVENOUS | Status: DC | PRN
  Administered 2017-12-06: 45 mg via INTRAVENOUS

## 2017-12-06 NOTE — Plan of Care (Signed)
  Education: Ability to demonstrate management of disease process will improve 12/06/2017 0426 - Completed/Met by Evert Kohl, RN   Coping: Level of anxiety will decrease 12/06/2017 0429 - Completed/Met by Evert Kohl, RN

## 2017-12-06 NOTE — CV Procedure (Signed)
Electrical Cardioversion Procedure Note  Diane [name]   77 y.o. female MRN: [id number] DOB: 05/15/1941  Today's date: 12/06/2017  Procedure: Electrical Cardioversion  Indications:  Atrial Fibrillation  Time Out: Verified patient identification, verified procedure,medications/allergies/relevent history reviewed, required imaging and test results available.  Performed  Procedure Details  The patient was NPO after midnight. Anesthesia was administered at the beside  by Dr.Carrigan with 45 mg of propofol.  Cardioversion was done with synchronized biphasic defibrillation with AP pads with  200 watts.  The patient converted to normal sinus rhythm. The patient tolerated the procedure well   IMPRESSION:  Successful cardioversion of atrial fibrillation    W. Tollie Eth, Brooke Bonito. MD [facility]   12/06/2017, 1:26 PM

## 2017-12-06 NOTE — H&P (View-Only) (Signed)
Subjective:  Even though she is diuresing and her weight is down creatinine is rising.  Planning to hold diuretics today.  Still somewhat hypokalemic.  Additional potassium given this morning.  Blood pressure currently somewhat down today too.  Objective:  Vital Signs in the last 24 hours: BP (!) 101/54 (BP Location: Right Arm)   Pulse (!) 101   Temp 98.2 F (36.8 C) (Oral)   Resp 18   Ht 5\' 5"  (1.651 m)   Wt 116.4 kg (256 lb 9.6 oz)   SpO2 94%   BMI 42.70 kg/m   Physical Exam: Obese female currently in no acute distress Lungs:  Reduced breath sounds at the right base, faint crackles on left Cardiac:  Irregular rhythm, normal S1 and S2, no S3 Abdomen:  Soft, nontender, no masses Extremities:  2+ edema with some erythema  Intake/Output from previous day: 01/09 0701 - 01/10 0700 In: 1260 [P.O.:1260] Out: 3225 [Urine:3225] Weight Filed Weights   12/04/17 0635 12/05/17 0557 12/06/17 0429  Weight: 118.1 kg (260 lb 4.8 oz) 118 kg (260 lb 1.6 oz) 116.4 kg (256 lb 9.6 oz)    Lab Results: Basic Metabolic Panel: Recent Labs    12/05/17 0342 12/06/17 0359  NA 136 136  K 3.2* 3.1*  CL 95* 94*  CO2 31 30  GLUCOSE 124* 126*  BUN 35* 43*  CREATININE 2.48* 2.63*    CBC: Recent Labs    12/03/17 2104  WBC 6.3  NEUTROABS 4.7  HGB 11.6*  HCT 36.6  MCV 97.6  PLT 212   Telemetry: Atrial fibrillation with controlled response today but rapid on admission.  Personally reviewed  Assessment/Plan:  1.  Persistent atrial fibrillation with rapid response better controlled today 2.  Acute on chronic diastolic heart failure probably somewhat over diuresed. 3.  Stage III chronic kidney disease creatinine somewhat worse today.  Uncertain whether there is some other etiology or whether she is over diuresed.  She does have a pleural effusion present on the right.  Planning to hold diuretics further and obtain renal consultation. 4.  Hypokalemia 5.  History of lung cancer under  treatment currently with right pleural effusion  Recommendations:  Planned cardioversion later today.  Given potassium this morning and will give an additional dose in an hour.  Cardioversion again discussed.  Requested renal consultation with [name].  Diuretics currently on hold.  Kerry Hough  MD [facility]  12/06/2017, 9:28 AM

## 2017-12-06 NOTE — Transfer of Care (Signed)
Immediate Anesthesia Transfer of Care Note  Patient: Diane [name]  Procedure(s) Performed: CARDIOVERSION (N/A )  Patient Location: Endoscopy Unit  Anesthesia Type:General  Level of Consciousness: awake, alert  and oriented  Airway & Oxygen Therapy: Patient Spontanous Breathing and Patient connected to nasal cannula oxygen  Post-op Assessment: Report given to RN, Post -op Vital signs reviewed and stable and Patient moving all extremities X 4  Post vital signs: Reviewed and stable  Last Vitals:  Vitals:   12/06/17 0801 12/06/17 1243  BP: (!) 101/54 121/75  Pulse: (!) 101 90  Resp: 18 20  Temp: 36.8 C 37.1 C  SpO2: 94% 97%    Last Pain:  Vitals:   12/06/17 1243  TempSrc: Oral  PainSc:          Complications: No apparent anesthesia complications

## 2017-12-06 NOTE — Consult Note (Signed)
Referring Provider: No ref. provider found Primary Care Physician:  [name], MD Primary Nephrologist:  [name]  Reason for Consultation:   Acute on chronic renal failure and management of congestive heart failure  HPI:  This is a delightful lady that has been followed by Dr Wynonia Lawman  She has what appears diastolic dysfunction and LVH on ECHO and has chronic renal failure  She has had increased in lower extremity edema  Her baseline creatinine appears to be about 1.6   She has had good diuresis of over 5 lbs since admission  Blood pressures look good and she has an irregular heart beat from atrial fibrillation    Diltiazem is used as an outpatient  She appears to have some reactive lung disease with periodic wheezing and also has some obesity    Past Medical History:  Diagnosis Date  . Aortic atherosclerosis ([place]) 12/03/2017  . CAD (coronary artery disease), native coronary artery 12/03/2017   Noted on CT scan by calcification   . CHF (congestive heart failure) ([place])   . Chronic diastolic heart failure ([place]) 12/03/2017  . GERD (gastroesophageal reflux disease)   . Heart murmur   . History of blood transfusion    "related to chemo" (12/05/2017)  . Hypertension   . Hypertensive heart disease 12/03/2017  . Kidney disease, chronic, stage III (GFR 30-59 ml/min) ([place]) 12/03/2017  . Lung cancer ([place]) dx'd 02/2010  . Mitral valve prolapse   . Morbid obesity ([place]) 12/03/2017  . 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 Persistent atrial fibrillation ([place]) 12/03/2017  . Restrictive lung disease 05/23/2017    Past Surgical History:  Procedure Laterality Date  . CATARACT EXTRACTION W/ INTRAOCULAR LENS  IMPLANT, BILATERAL Bilateral   . DERMABRASION  ~ 1966   "face; for acne"  . PAROTID GLAND TUMOR EXCISION Left   . PORTACATH PLACEMENT  11/03/08-[place]   tip in mid SVC-[facility]. [place]  . THORACENTESIS Right   . TONSILLECTOMY      Prior to Admission medications   Medication Sig  Start Date End Date Taking? Authorizing Provider  apixaban (ELIQUIS) 5 MG TABS tablet Take 5 mg by mouth 2 (two) times daily.   Yes [provider]  calcitRIOL (ROCALTROL) 0.25 MCG capsule Take 0.25 mcg by mouth every Monday, Wednesday, and Friday. 06/25/12  Yes [provider]  carvedilol (COREG) 25 MG tablet Take 25 mg by mouth 2 (two) times daily. 10/22/17  Yes [provider]  diltiazem (CARDIZEM LA) 180 MG 24 hr tablet Take 180 mg by mouth daily. 11/12/17  Yes [provider]  polyethylene glycol (MIRALAX / GLYCOLAX) packet Take 17 g by mouth daily. Reported on 06/05/2016   Yes [provider]  torsemide (DEMADEX) 20 MG tablet Take 60-100 mg by mouth See admin instructions. 100mg  in the morning and 60mg  at noon   Yes Edrick Oh, MD  amLODipine (NORVASC) 10 MG tablet Take 10 mg by mouth daily. 10/22/17   [provider]    Current Facility-Administered Medications  Medication Dose Route Frequency Provider Last Rate Last Dose  . 0.9 %  sodium chloride infusion  250 mL Intravenous PRN Jacolyn Reedy, MD      . acetaminophen (TYLENOL) tablet 650 mg  650 mg Oral Q4H PRN Jacolyn Reedy, MD      . amiodarone (PACERONE) tablet 200 mg  200 mg Oral BID Jacolyn Reedy, MD   200 mg at 12/06/17 6295  . apixaban (ELIQUIS) tablet 5  mg  5 mg Oral BID Jacolyn Reedy, MD   5 mg at 12/06/17 0827  . carvedilol (COREG) tablet 25 mg  25 mg Oral BID WC Jacolyn Reedy, MD   25 mg at 12/06/17 0827  . ondansetron (ZOFRAN) injection 4 mg  4 mg Intravenous Q6H PRN Jacolyn Reedy, MD      . potassium chloride SA (K-DUR,KLOR-CON) CR tablet 40 mEq  40 mEq Oral BID Bhagat, Bhavinkumar, PA   40 mEq at 12/06/17 0826  . sodium chloride flush (NS) 0.9 % injection 10-40 mL  10-40 mL Intracatheter PRN Jacolyn Reedy, MD   10 mL at 12/04/17 1813  . sodium chloride flush (NS) 0.9 % injection 3 mL  3 mL Intravenous Q12H Jacolyn Reedy, MD   3 mL at  12/03/17 2107  . sodium chloride flush (NS) 0.9 % injection 3 mL  3 mL Intravenous PRN Jacolyn Reedy, MD       Facility-Administered Medications Ordered in Other Encounters  Medication Dose Route Frequency Provider Last Rate Last Dose  . sodium chloride 0.9 % injection 10 mL  10 mL Intravenous PRN Curt Bears, MD   10 mL at 10/14/13 1030    Allergies as of 12/03/2017 - Review Complete 12/03/2017  Allergen Reaction Noted  . Septra ds [sulfamethoxazole-trimethoprim] Nausea Only 07/14/2014    Family History  Problem Relation Age of Onset  . Breast cancer Mother   . Heart attack Mother   . Diabetes Father   . Heart attack Father   . Diabetes Brother   . COPD Cousin     Social History   Socioeconomic History  . Marital status: Married    Spouse name: Not on file  . Number of children: Not on file  . Years of education: Not on file  . Highest education level: Not on file  Social Needs  . Financial resource strain: Not hard at all  . Food insecurity - worry: Patient refused  . Food insecurity - inability: Patient refused  . Transportation needs - medical: Patient refused  . Transportation needs - non-medical: Patient refused  Occupational History  . Not on file  Tobacco Use  . Smoking status: Former Smoker    Packs/day: 2.00    Years: 40.00    Pack years: 80.00    Types: Cigarettes    Last attempt to quit: 11/27/2001    Years since quitting: 16.0  . Smokeless tobacco: Never Used  Substance and Sexual Activity  . Alcohol use: Yes    Comment: 12/05/2017 "once in a great while I might have a drink"  . Drug use: No  . Sexual activity: Not Currently  Other Topics Concern  . Not on file  Social History Narrative   [facility] (02/19/17):   Patient is originally from [facility]. She has also lived in [place]. She has worked primarily Veterinary surgeon. Has a dog currently. No bird or mold exposure.     Review of Systems: Gen: Denies any fever, chills, sweats, anorexia, +  fatigue, + weakness,  HEENT: No visual complaints, No history of Retinopathy. Normal external appearance No Epistaxis or Sore throat. No sinusitis.   CV: Denies chest pain, angina, palpitations, syncope, orthopnea, PND, + peripheral edema, andno claudication. Resp: + dyspnea at rest, + dyspnea with exercise, no cough, sputum, wheezing, coughing up blood, and pleurisy. GI: Denies vomiting blood, jaundice, and fecal incontinence.   Denies dysphagia or odynophagia. GU : Denies urinary burning, blood in urine, urinary  frequency, urinary hesitancy, nocturnal urination, and urinary incontinence.  No renal calculi. MS: Denies joint pain, limitation of movement, and swelling, stiffness, low back pain, extremity pain. Denies muscle weakness, cramps, atrophy.  No use of non steroidal antiinflammatory drugs. Derm: Denies rash, itching, dry skin, hives, moles, warts, or unhealing ulcers.  Psych: Denies depression, anxiety, memory loss, suicidal ideation, hallucinations, paranoia, and confusion. Heme: Denies bruising, bleeding, and enlarged lymph nodes. Neuro: No headache.  No diplopia. No dysarthria.  No dysphasia.  No history of CVA.  No Seizures. No paresthesias.  No weakness. Endocrine no  DM.  No Thyroid disease.  No Adrenal disease.  Physical Exam: Vital signs in last 24 hours: Temp:  [97.7 F (36.5 C)-98.3 F (36.8 C)] 98.2 F (36.8 C) (01/10 0801) Pulse Rate:  [71-101] 101 (01/10 0801) Resp:  [18] 18 (01/10 0801) BP: (101-115)/(45-76) 101/54 (01/10 0801) SpO2:  [94 %-95 %] 94 % (01/10 0801) Weight:  [256 lb 9.6 oz (116.4 kg)] 256 lb 9.6 oz (116.4 kg) (01/10 0429) Last BM Date: 12/04/17(per pt) General:   Obese lady sitting out of bed Head:  Normocephalic and atraumatic. Eyes:  Sclera clear, no icterus.   Conjunctiva pink. Ears:  Normal auditory acuity. Nose:  No deformity, discharge,  or lesions. Mouth:  No deformity or lesions, dentition normal. Neck:  Supple; no masses or thyromegaly. JVP  not elevated Lungs:  Clear throughout to auscultation.   No wheezes, crackles, or rhonchi. No acute distress. Heart:  iregular rate and rhythm; no murmurs, clicks, rubs,  or gallops. Abdomen:  Soft, nontender and nondistended. No masses, hepatosplenomegaly or hernias noted. Normal bowel sounds, without guarding, and without rebound.   Msk:  Symmetrical without gross deformities. Normal posture. Pulses:  No carotid, renal, femoral bruits. DP and PT symmetrical and equal Extremities:  2 - 3 +  edema. Neurologic:  Alert and  oriented x4;  grossly normal neurologically. Skin:  Intact without significant lesions or rashes. Cervical Nodes:  No significant cervical adenopathy. Psych:  Alert and cooperative. Normal mood and affect.  Intake/Output from previous day: 01/09 0701 - 01/10 0700 In: 1260 [P.O.:1260] Out: 3225 [Urine:3225] Intake/Output this shift: Total I/O In: -  Out: 700 [Urine:700]  Lab Results: Recent Labs    12/03/17 2104  WBC 6.3  HGB 11.6*  HCT 36.6  PLT 212   BMET Recent Labs    12/04/17 0406 12/05/17 0342 12/06/17 0359  NA 139 136 136  K 3.3* 3.2* 3.1*  CL 99* 95* 94*  CO2 30 31 30   GLUCOSE 120* 124* 126*  BUN 28* 35* 43*  CREATININE 2.21* 2.48* 2.63*  CALCIUM 8.8* 9.1 8.8*   LFT Recent Labs    12/03/17 2104  PROT 6.4*  ALBUMIN 3.1*  AST 41  ALT 57*  ALKPHOS 84  BILITOT 1.1   PT/INR Recent Labs    12/03/17 2104  LABPROT 19.8*  INR 1.70   Hepatitis Panel No results for input(s): HEPBSAG, HCVAB, HEPAIGM, HEPBIGM in the last 72 hours.  Studies/Results: No results found.  Assessment/Plan:  Acute on chronic renal failure  Renal ultrasound has been ordered. We shall check a urinalysis. It appears that the renal function has worsened with diuretics and the edema has persisted. I would avoid ACE or ARB and NSAIDS and IV contrast  Volume will proceed with stopping diltiazem as this could have an effect on the direct effect on causing leg  edema   She appears to have signs of predominantly Right sided heart  failure. I think we could resume home dose of torsemide tomorrow once discharged  Anemia stable no ESA   LOS: 3 Quanetta Truss W @TODAY @11 :19 AM

## 2017-12-06 NOTE — Plan of Care (Signed)
  Education: Ability to demonstrate management of disease process will improve 12/06/2017 0426 - Completed/Met by Evert Kohl, RN

## 2017-12-06 NOTE — Progress Notes (Signed)
Received post Cardioversion, on NSR on the monitor.Patient alert and oriented, visitors at bedside.

## 2017-12-06 NOTE — Anesthesia Preprocedure Evaluation (Signed)
Anesthesia Evaluation  Patient identified by MRN, date of birth, ID band Patient awake    Reviewed: Allergy & Precautions, NPO status , Patient's Chart, lab work & pertinent test results  Airway Mallampati: II  TM Distance: >3 FB Neck ROM: Full    Dental no notable dental hx.    Pulmonary former smoker,    Pulmonary exam normal breath sounds clear to auscultation       Cardiovascular hypertension, + CAD and +CHF  + dysrhythmias Atrial Fibrillation  Rhythm:Irregular Rate:Normal     Neuro/Psych negative neurological ROS  negative psych ROS   GI/Hepatic negative GI ROS, Neg liver ROS,   Endo/Other  Morbid obesity  Renal/GU Renal InsufficiencyRenal disease  negative genitourinary   Musculoskeletal negative musculoskeletal ROS (+)   Abdominal   Peds negative pediatric ROS (+)  Hematology negative hematology ROS (+)   Anesthesia Other Findings   Reproductive/Obstetrics negative OB ROS                             Anesthesia Physical Anesthesia Plan  ASA: III  Anesthesia Plan: General   Post-op Pain Management:    Induction: Intravenous  PONV Risk Score and Plan: 3 and Treatment may vary due to age or medical condition  Airway Management Planned: Mask  Additional Equipment:   Intra-op Plan:   Post-operative Plan:   Informed Consent: I have reviewed the patients History and Physical, chart, labs and discussed the procedure including the risks, benefits and alternatives for the proposed anesthesia with the patient or authorized representative who has indicated his/her understanding and acceptance.   Dental advisory given  Plan Discussed with: CRNA  Anesthesia Plan Comments:         Anesthesia Quick Evaluation

## 2017-12-06 NOTE — Anesthesia Postprocedure Evaluation (Signed)
Anesthesia Post Note  Patient: Diane [name] [facility]  Procedure(s) Performed: CARDIOVERSION (N/A )     Patient location during evaluation: Endoscopy Anesthesia Type: General Level of consciousness: awake and alert Pain management: pain level controlled Vital Signs Assessment: post-procedure vital signs reviewed and stable Respiratory status: spontaneous breathing, nonlabored ventilation, respiratory function stable and patient connected to nasal cannula oxygen Cardiovascular status: blood pressure returned to baseline and stable Postop Assessment: no apparent nausea or vomiting Anesthetic complications: no    Last Vitals:  Vitals:   12/06/17 1340 12/06/17 1350  BP: (!) 134/59 135/65  Pulse: 81 85  Resp: 15 (!) 30  Temp:    SpO2: 98% 99%    Last Pain:  Vitals:   12/06/17 1330  TempSrc: Oral  PainSc:                  Montez Hageman

## 2017-12-06 NOTE — Plan of Care (Signed)
  Education: Ability to demonstrate management of disease process will improve 12/06/2017 0426 - Completed/Met by Evert Kohl, RN   Education: Knowledge of General Education information will improve 12/06/2017 0429 - Completed/Met by Evert Kohl, RN

## 2017-12-06 NOTE — Interval H&P Note (Signed)
History and Physical Interval Note:  12/06/2017 1:26 PM  [name]  has presented today for surgery, with the diagnosis of atrial fibrillation  The various methods of treatment have been discussed with the patient and family. After consideration of risks, benefits and other options for treatment, the patient has consented to  Procedure(s): CARDIOVERSION (N/A) as a surgical intervention .  The patient's history has been reviewed, patient examined, no change in status, stable for surgery.  I have reviewed the patient's chart and labs.  Questions were answered to the patient's satisfaction.     Diane [name]

## 2017-12-06 NOTE — Progress Notes (Signed)
Subjective:  Even though she is diuresing and her weight is down creatinine is rising.  Planning to hold diuretics today.  Still somewhat hypokalemic.  Additional potassium given this morning.  Blood pressure currently somewhat down today too.  Objective:  Vital Signs in the last 24 hours: BP (!) 101/54 (BP Location: Right Arm)   Pulse (!) 101   Temp 98.2 F (36.8 C) (Oral)   Resp 18   Ht 5\' 5"  (1.651 m)   Wt 116.4 kg (256 lb 9.6 oz)   SpO2 94%   BMI 42.70 kg/m   Physical Exam: Obese female currently in no acute distress Lungs:  Reduced breath sounds at the right base, faint crackles on left Cardiac:  Irregular rhythm, normal S1 and S2, no S3 Abdomen:  Soft, nontender, no masses Extremities:  2+ edema with some erythema  Intake/Output from previous day: 01/09 0701 - 01/10 0700 In: 1260 [P.O.:1260] Out: 3225 [Urine:3225] Weight Filed Weights   12/04/17 0635 12/05/17 0557 12/06/17 0429  Weight: 118.1 kg (260 lb 4.8 oz) 118 kg (260 lb 1.6 oz) 116.4 kg (256 lb 9.6 oz)    Lab Results: Basic Metabolic Panel: Recent Labs    12/05/17 0342 12/06/17 0359  NA 136 136  K 3.2* 3.1*  CL 95* 94*  CO2 31 30  GLUCOSE 124* 126*  BUN 35* 43*  CREATININE 2.48* 2.63*    CBC: Recent Labs    12/03/17 2104  WBC 6.3  NEUTROABS 4.7  HGB 11.6*  HCT 36.6  MCV 97.6  PLT 212   Telemetry: Atrial fibrillation with controlled response today but rapid on admission.  Personally reviewed  Assessment/Plan:  1.  Persistent atrial fibrillation with rapid response better controlled today 2.  Acute on chronic diastolic heart failure probably somewhat over diuresed. 3.  Stage III chronic kidney disease creatinine somewhat worse today.  Uncertain whether there is some other etiology or whether she is over diuresed.  She does have a pleural effusion present on the right.  Planning to hold diuretics further and obtain renal consultation. 4.  Hypokalemia 5.  History of lung cancer under  treatment currently with right pleural effusion  Recommendations:  Planned cardioversion later today.  Given potassium this morning and will give an additional dose in an hour.  Cardioversion again discussed.  Requested renal consultation with [name].  Diuretics currently on hold.  Kerry Hough  MD [facility]  12/06/2017, 9:28 AM

## 2017-12-07 ENCOUNTER — Encounter (HOSPITAL_COMMUNITY): Payer: Self-pay | Admitting: Cardiology

## 2017-12-07 LAB — BASIC METABOLIC PANEL
Anion gap: 12 (ref 5–15)
BUN: 38 mg/dL — ABNORMAL HIGH (ref 6–20)
CHLORIDE: 93 mmol/L — AB (ref 101–111)
CO2: 31 mmol/L (ref 22–32)
CREATININE: 2.39 mg/dL — AB (ref 0.44–1.00)
Calcium: 9.1 mg/dL (ref 8.9–10.3)
GFR, EST AFRICAN AMERICAN: 21 mL/min — AB (ref >60)
GFR, EST NON AFRICAN AMERICAN: 18 mL/min — AB (ref >60)
Glucose, Bld: 127 mg/dL — ABNORMAL HIGH (ref 65–99)
Potassium: 3.5 mmol/L (ref 3.5–5.1)
SODIUM: 136 mmol/L (ref 135–145)

## 2017-12-07 MED ORDER — HEPARIN SOD (PORK) LOCK FLUSH 100 UNIT/ML IV SOLN
500.0000 [IU] | INTRAVENOUS | Status: AC | PRN
  Administered 2017-12-07: 500 [IU]

## 2017-12-07 MED ORDER — TORSEMIDE 20 MG PO TABS: 40.0000 mg | ORAL_TABLET | Freq: Two times a day (BID) | ORAL | Status: DC

## 2017-12-07 MED ORDER — AMIODARONE HCL 200 MG PO TABS: 200.0000 mg | ORAL_TABLET | Freq: Two times a day (BID) | ORAL | 0 refills | Status: DC

## 2017-12-07 MED ORDER — TORSEMIDE 20 MG PO TABS
100.0000 mg | ORAL_TABLET | Freq: Two times a day (BID) | ORAL | Status: DC
  Administered 2017-12-07: 100 mg via ORAL
  Filled 2017-12-07: qty 5

## 2017-12-07 NOTE — Progress Notes (Signed)
Dischsrge to home, d/c instructions and follow up appointments discussed with patient. Verbalized understanding. Port a cath de accessed by IV RN.

## 2017-12-07 NOTE — Plan of Care (Signed)
  Progressing Health Behavior/Discharge Planning: Ability to manage health-related needs will improve 12/07/2017 0042 - Progressing by Theador Hawthorne, RN Clinical Measurements: Ability to maintain clinical measurements within normal limits will improve 12/07/2017 0042 - Progressing by Theador Hawthorne, RN Will remain free from infection 12/07/2017 0042 - Progressing by Theador Hawthorne, RN Diagnostic test results will improve 12/07/2017 0042 - Progressing by Theador Hawthorne, RN Respiratory complications will improve 12/07/2017 0042 - Progressing by Theador Hawthorne, RN Cardiovascular complication will be avoided 12/07/2017 0042 - Progressing by Theador Hawthorne, RN Activity: Risk for activity intolerance will decrease 12/07/2017 0042 - Progressing by Theador Hawthorne, RN Nutrition: Adequate nutrition will be maintained 12/07/2017 0042 - Progressing by Theador Hawthorne, RN Elimination: Will not experience complications related to bowel motility 12/07/2017 0042 - Progressing by Theador Hawthorne, RN Will not experience complications related to urinary retention 12/07/2017 0042 - Progressing by Theador Hawthorne, RN Safety: Ability to remain free from injury will improve 12/07/2017 0042 - Progressing by Theador Hawthorne, RN Skin Integrity: Risk for impaired skin integrity will decrease 12/07/2017 0042 - Progressing by Theador Hawthorne, RN Education: Ability to verbalize understanding of medication therapies will improve 12/07/2017 0042 - Progressing by Theador Hawthorne, RN Activity: Capacity to carry out activities will improve 12/07/2017 0042 - Progressing by Theador Hawthorne, RN Cardiac: Ability to achieve and maintain adequate cardiopulmonary perfusion will improve 12/07/2017 0042 - Progressing by Theador Hawthorne, RN

## 2017-12-07 NOTE — Discharge Summary (Signed)
Physician Discharge Summary  Patient ID: Diane [name] MRN: [id number] DOB/AGE: 05/20/1941 77 y.o.  Admit date: 12/03/2017 Discharge date: 12/07/2017  Primary Physician: Dr. Arelia Sneddon  Primary Discharge Diagnosis:  1.  Persistent atrial fibrillation resolved following cardioversion and amiodarone  Secondary Discharge Diagnosis: 2.  Chronic diastolic heart failure 3.  Acute on chronic renal failure improving at the time of discharge 4.  Hypertensive heart disease 5.  Right lung cancer 6.  CAD as manifested by coronary artery calcification on CT scan 7.  Aortic atherosclerosis is manifested by calcification on CT scan 8.  Obesity with need to lose weight  Procedures:  Cardioversion  Consults:  Nephrology Dr. Chip Boer Course: The patient was brought into the hospital with increasing dyspnea and persistent edema of her lower extremities.  She had developed rapid atrial fibrillation shortly before Christmas and had been started on diltiazem and continued on carvedilol.  Despite increasing diuretics as an outpatient she remained short of breath and edematous and remained in rapid atrial fibrillation.  After she had been anticoagulated for 3 weeks with Eliquis she was brought into the hospital because of persistent dyspnea and failure to diurese as an outpatient.  The patient was admitted to the hospital and had an elevated BNP on arrival as well as interstitial congestion on chest x-ray.  She was given intravenous diuresis with furosemide and received a single dose of Zaroxolyn.  She was able to diurese some but very little change in weight and remained in atrial fibrillation.  Amiodarone was started and she remained in atrial fibrillation.  Her BUN and creatinine started to climb with diuresis and I asked her to see the nephrologist in the hospital.  At that point we held her diuresis as she had lost weight.  She underwent cardioversion on January 10 with reversion to sinus rhythm.   She tolerated the procedure well.  Her creatinine had improved with holding her diuretics to 2.39 on the day of discharge but peaked as high as 2.63.  On admission it was 2.37.  She diuresed 5 pounds while in the hospital and her edema improved.  She was seen by the nephrologist and after discussion with him we opted to discontinue her diltiazem and to continue her carvedilol as well as amiodarone and a loading dose fashion.  She will have an early follow-up with the nephrologist next week.  He felt that we should resume her diuretics and it was opted to resume her at a lower dose of torsemide 40 mg twice daily with follow-up early next week at his office in 2 weeks in my office as I will be out of town next week.  She was clinically better and dyspnea was improved on discharge.  Discharge Exam: Blood pressure 137/61, pulse 97, temperature 98.9 F (37.2 C), temperature source Oral, resp. rate 18, height 5\' 5"  (1.651 m), weight 115.9 kg (255 lb 9.6 oz), SpO2 94 %. Weight: 115.9 kg (255 lb 9.6 oz) Severely obese white female currently in no acute distress, there are reduced breath sounds in the right base, normal S1 and S2, no S3 Labs: CBC:   Lab Results  Component Value Date   WBC 6.3 12/03/2017   HGB 11.6 (L) 12/03/2017   HCT 36.6 12/03/2017   MCV 97.6 12/03/2017   PLT 212 12/03/2017    CMP:  Recent Labs  Lab 12/03/17 2104  12/07/17 0417  NA 138   < > 136  K 3.4*   < > 3.5  CL 98*   < > 93*  CO2 30   < > 31  BUN 27*   < > 38*  CREATININE 2.37*   < > 2.39*  CALCIUM 9.1   < > 9.1  PROT 6.4*  --   --   BILITOT 1.1  --   --   ALKPHOS 84  --   --   ALT 57*  --   --   AST 41  --   --   GLUCOSE 148*   < > 127*   < > = values in this interval not displayed.   Hemoglobin A1C: Lab Results  Component Value Date   HGBA1C  12/04/2009    4.9 (NOTE) The ADA recommends the following therapeutic goal for glycemic control related to Hgb A1c measurement: Goal of therapy: <6.5 Hgb A1c   Reference: American Diabetes Association: Clinical Practice Recommendations 2010, Diabetes Care, 2010, 33: (Suppl  1).     Radiology: Chest x-ray showed chronic changes consistent with fibrosis, possible mild interstitial edema, possible pleural effusion, thoracic atherosclerosis  EKG: EKG on admission shows right bundle branch block, atrial fibrillation with rapid ventricular response.  Following cardioversion her EKG showed sinus rhythm with right bundle branch block.  Discharge Medications: Allergies as of 12/07/2017      Reactions   Septra Ds [sulfamethoxazole-trimethoprim] Nausea Only      Medication List    STOP taking these medications   amLODipine 10 MG tablet Commonly known as:  NORVASC   diltiazem 180 MG 24 hr tablet Commonly known as:  CARDIZEM LA     TAKE these medications   amiodarone 200 MG tablet Commonly known as:  PACERONE Take 1 tablet (200 mg total) by mouth 2 (two) times daily.   calcitRIOL 0.25 MCG capsule Commonly known as:  ROCALTROL Take 0.25 mcg by mouth every Monday, Wednesday, and Friday.   carvedilol 25 MG tablet Commonly known as:  COREG Take 25 mg by mouth 2 (two) times daily.   ELIQUIS 5 MG Tabs tablet Generic drug:  apixaban Take 5 mg by mouth 2 (two) times daily.   polyethylene glycol packet Commonly known as:  MIRALAX / GLYCOLAX Take 17 g by mouth daily. Reported on 06/05/2016   torsemide 20 MG tablet Commonly known as:  DEMADEX Take 2 tablets (40 mg total) by mouth 2 (two) times daily. What changed:    how much to take  when to take this  additional instructions       Followup plans and appointments: She is to follow-up with Dr. Edrick Oh next week for repeat renal panel on Tuesday and visit with him and will see me in follow-up in 2 weeks.  She is to weigh daily and to continue to restrict her fluids.  Time spent with patient to include physician time: 45 minutes   signed: Kerry Hough. MD [facility] 12/07/2017,  5:20 PM

## 2017-12-07 NOTE — Progress Notes (Signed)
Patient stable overnight. Patient remains in NSR Hr 99. No complaints at this time. In chair resting. Will continue to monitor patient.

## 2017-12-07 NOTE — Progress Notes (Signed)
Pharmacist Heart Failure Core Measure Documentation  Assessment: Diane [name] has an EF documented as around 60% per H&P from cardiology so ACEi/ARB not indicated.   Rationale: Heart failure patients with left ventricular systolic dysfunction (LVSD) and an EF < 40% should be prescribed an angiotensin converting enzyme inhibitor (ACEI) or angiotensin receptor blocker (ARB) at discharge unless a contraindication is documented in the medical record.  This patient is not currently on an ACEI or ARB for HF.  This note is being placed in the record in order to provide documentation that a contraindication to the use of these agents is present for this encounter.  ACE Inhibitor or Angiotensin Receptor Blocker is contraindicated (specify all that apply)  []   ACEI allergy AND ARB allergy []   Angioedema []   Moderate or severe aortic stenosis []   Hyperkalemia []   Hypotension []   Renal artery stenosis []   Worsening renal function, preexisting renal disease or dysfunction   Georgina Peer 12/07/2017 10:53 AM

## 2017-12-07 NOTE — Progress Notes (Signed)
[facility] ROUNDING NOTE   Subjective:   Interval History: creatinine improved  S/p cardioversion  Great diuresis   Stopped the diltiazem  Now in sinus rhythm  Appears to be doing better and wants to go home  Resume torsemide 40mg  BID   Objective:  Vital signs in last 24 hours:  Temp:  [98.1 F (36.7 C)-99.1 F (37.3 C)] 98.9 F (37.2 C) (01/11 0442) Pulse Rate:  [79-97] 97 (01/11 0442) Resp:  [15-30] 18 (01/11 0442) BP: (108-137)/(59-78) 137/61 (01/11 0442) SpO2:  [94 %-99 %] 94 % (01/11 0442) Weight:  [255 lb 9.6 oz (115.9 kg)] 255 lb 9.6 oz (115.9 kg) (01/11 0442)  Weight change:  (-0.454 kg) Filed Weights   12/05/17 0557 12/06/17 0429 12/07/17 0442  Weight: 260 lb 1.6 oz (118 kg) 256 lb 9.6 oz (116.4 kg) 255 lb 9.6 oz (115.9 kg)    Intake/Output: I/O last 3 completed shifts: In: 580 [P.O.:480; I.V.:100] Out: 4150 [Urine:4150]   Intake/Output this shift:  Total I/O In: 240 [P.O.:240] Out: -   CVS- RRR RS- CTA ABD- BS present soft non-distended EXT-  2+ edema   Basic Metabolic Panel: Recent Labs  Lab 12/03/17 2104 12/04/17 0406 12/05/17 0342 12/06/17 0359 12/07/17 0417  NA 138 139 136 136 136  K 3.4* 3.3* 3.2* 3.1* 3.5  CL 98* 99* 95* 94* 93*  CO2 30 30 31 30 31   GLUCOSE 148* 120* 124* 126* 127*  BUN 27* 28* 35* 43* 38*  CREATININE 2.37* 2.21* 2.48* 2.63* 2.39*  CALCIUM 9.1 8.8* 9.1 8.8* 9.1    Liver Function Tests: Recent Labs  Lab 12/03/17 2104  AST 41  ALT 57*  ALKPHOS 84  BILITOT 1.1  PROT 6.4*  ALBUMIN 3.1*   No results for input(s): LIPASE, AMYLASE in the last 168 hours. No results for input(s): AMMONIA in the last 168 hours.  CBC: Recent Labs  Lab 12/03/17 2104  WBC 6.3  NEUTROABS 4.7  HGB 11.6*  HCT 36.6  MCV 97.6  PLT 212    Cardiac Enzymes: No results for input(s): CKTOTAL, CKMB, CKMBINDEX, TROPONINI in the last 168 hours.  BNP: Invalid input(s): POCBNP  CBG: No results for input(s): GLUCAP  in the last 168 hours.  Microbiology: Results for orders placed or performed during the hospital encounter of 12/04/09  Culture, blood (routine x 2)     Status: None   Collection Time: 12/04/09  4:10 AM  Result Value Ref Range Status   Specimen Description BLOOD LEFT HAND  Final   Special Requests BOTTLES DRAWN AEROBIC AND ANAEROBIC 5CC  Final   Culture   Final    ESCHERICHIA COLI Note: SUSCEPTIBILITIES PERFORMED ON PREVIOUS CULTURE WITHIN THE LAST 5 DAYS. Note: Gram Stain Report Called to,Read Back By and Verified With: RN A. ASHLEY ON 12/05/09 AT 1105 BY TEDAR   Report Status 12/06/2009 FINAL  Final  Culture, blood (routine x 2)     Status: None   Collection Time: 12/04/09  4:20 AM  Result Value Ref Range Status   Specimen Description BLOOD RIGHT HAND  Final   Special Requests BOTTLES DRAWN AEROBIC AND ANAEROBIC 5CC  Final   Culture   Final    ESCHERICHIA COLI Note: Gram Stain Report Called to,Read Back By and Verified With: RN A. RAMZAH ON 12/04/09 AT 1758 BY TEDRA   Report Status 12/06/2009 FINAL  Final   Organism ID, Bacteria ESCHERICHIA COLI  Final      Susceptibility   Escherichia  coli - MIC    AMPICILLIN >=32 Resistant     AMPICILLIN/SULBACTAM 8 Sensitive     CEFAZOLIN <=4 Sensitive     CEFEPIME <=1 Sensitive     CEFTAZIDIME <=1 Sensitive     CEFTRIAXONE <=1 Sensitive     CIPROFLOXACIN <=0.25 Sensitive     GENTAMICIN <=1 Sensitive     IMIPENEM <=1 Sensitive     TOBRAMYCIN <=1 Sensitive     TRIMETH/SULFA <=20 Sensitive     CEFOXITIN <=4 Sensitive   Urine culture     Status: None   Collection Time: 12/04/09  9:11 AM  Result Value Ref Range Status   Specimen Description URINE, CATHETERIZED  Final   Special Requests NONE  Final   Colony Count >=100,000 COLONIES/ML  Final   Culture ESCHERICHIA COLI  Final   Report Status 12/06/2009 FINAL  Final   Organism ID, Bacteria ESCHERICHIA COLI  Final      Susceptibility   Escherichia coli - MIC    AMPICILLIN >=32 Resistant      CEFAZOLIN <=4 Sensitive     CEFTRIAXONE <=1 Sensitive     CIPROFLOXACIN <=0.25 Sensitive     GENTAMICIN <=1 Sensitive     LEVOFLOXACIN <=0.12 Sensitive     NITROFURANTOIN <=16 Sensitive     TOBRAMYCIN <=1 Sensitive     TRIMETH/SULFA <=20 Sensitive   Culture, blood (routine x 2)     Status: None   Collection Time: 12/05/09  9:50 AM  Result Value Ref Range Status   Specimen Description BLOOD LEFT ARM  Final   Special Requests BOTTLES DRAWN AEROBIC AND ANAEROBIC 10CC EACH  Final   Culture NO GROWTH 5 DAYS  Final   Report Status 12/11/2009 FINAL  Final  Culture, blood (routine x 2)     Status: None   Collection Time: 12/05/09  9:55 AM  Result Value Ref Range Status   Specimen Description BLOOD RIGHT ARM  Final   Special Requests BOTTLES DRAWN AEROBIC AND ANAEROBIC 10CC EACH  Final   Culture NO GROWTH 5 DAYS  Final   Report Status 12/11/2009 FINAL  Final  Urine culture     Status: None   Collection Time: 12/05/09  1:03 PM  Result Value Ref Range Status   Specimen Description URINE, RANDOM  Final   Special Requests IMMUNE:NORM UT SYMPT:NEG  Final   Colony Count NO GROWTH  Final   Culture NO GROWTH  Final   Report Status 12/06/2009 FINAL  Final  Culture, blood (routine x 2)     Status: None   Collection Time: 12/07/09  8:40 AM  Result Value Ref Range Status   Specimen Description BLOOD LEFT ARM  Final   Special Requests BOTTLES DRAWN AEROBIC AND ANAEROBIC 5CC EA  Final   Culture NO GROWTH 5 DAYS  Final   Report Status 12/13/2009 FINAL  Final  Culture, blood (routine x 2)     Status: None   Collection Time: 12/07/09  8:50 AM  Result Value Ref Range Status   Specimen Description BLOOD RIGHT HAND  Final   Special Requests BOTTLES DRAWN AEROBIC ONLY [place]  Final   Culture NO GROWTH 5 DAYS  Final   Report Status 12/13/2009 FINAL  Final    Coagulation Studies: No results for input(s): LABPROT, INR in the last 72 hours.  Urinalysis: Recent Labs    12/06/17 [place]   LABSPEC 1.006  PHURINE 6.0  [place] NEGATIVE  BILIRUBINUR NEGATIVE  Benjamin Stain  NEGATIVE  PROTEINUR NEGATIVE  NITRITE NEGATIVE  LEUKOCYTESUR NEGATIVE      Imaging: US Renal  Result Date: 12/06/2017 CLINICAL DATA:  Acute on chronic renal failure. EXAM: RENAL / URINARY TRACT ULTRASOUND COMPLETE COMPARISON:  None. FINDINGS: Right Kidney: Length: 7.2 cm. Echogenicity within normal limits. Cortical thinning. No mass or hydronephrosis visualized. Left Kidney: Length: 9.6 cm. Echogenicity within normal limits. Cortical thinning. 1.0 cm simple cyst upper pole left kidney. No hydronephrosis visualized. Bladder: Appears normal for degree of bladder distention. IMPRESSION: 1.  1.0 cm simple cyst upper pole left kidney. 2. Bilateral small kidneys, particular on the right. Bilateral renal cortical thinning, again particularly on the right. These findings are consistent with renal atrophy. Electronically Signed   By: Marcello Moores  Register   On: 12/06/2017 12:06     Medications:   . sodium chloride     . amiodarone  200 mg Oral BID  . apixaban  5 mg Oral BID  . carvedilol  25 mg Oral BID WC  . potassium chloride  40 mEq Oral BID  . sodium chloride flush  3 mL Intravenous Q12H  . torsemide  100 mg Oral BID   sodium chloride, acetaminophen, heparin lock flush, hydrocortisone cream, ondansetron (ZOFRAN) IV, sodium chloride flush, sodium chloride flush  Assessment/ Plan:   Acute on chronic renal failure  Renal ultrasound no hydronephrosis  It appears that the renal function has worsened with diuretics and the edema has persisted.  Today the renal function is better   Volume will proceed with stopping diltiazem as this could have an effect on the direct effect on causing leg edema   She appears to have signs of predominantly Right sided heart failure. I think we could resume dose of torsemide tomorrow once discharged 40mg  twice a day ( discussed with Dr Wynonia Lawman)  Anemia stable no  ESA  Will see patient in clinic Tuesday next week       LOS: 4 Danaisha Celli W @TODAY @10 :44 AM

## 2017-12-11 ENCOUNTER — Encounter (HOSPITAL_COMMUNITY): Payer: Self-pay | Admitting: Emergency Medicine

## 2017-12-11 ENCOUNTER — Observation Stay (HOSPITAL_COMMUNITY)
Admission: EM | Admit: 2017-12-11 | Discharge: 2017-12-13 | Disposition: A | Discharge status: Home / Self Care | Payer: Medicare Other | Attending: Student in an Organized Health Care Education/Training Program | Admitting: Student in an Organized Health Care Education/Training Program

## 2017-12-11 ENCOUNTER — Emergency Department (HOSPITAL_COMMUNITY): Payer: Medicare Other

## 2017-12-11 ENCOUNTER — Other Ambulatory Visit: Payer: Self-pay

## 2017-12-11 DIAGNOSIS — J432 Centrilobular emphysema: Secondary | ICD-10-CM | POA: Diagnosis not present

## 2017-12-11 DIAGNOSIS — Z9221 Personal history of antineoplastic chemotherapy: Secondary | ICD-10-CM | POA: Diagnosis not present

## 2017-12-11 DIAGNOSIS — Z85118 Personal history of other malignant neoplasm of bronchus and lung: Secondary | ICD-10-CM | POA: Diagnosis not present

## 2017-12-11 DIAGNOSIS — Z888 Allergy status to other drugs, medicaments and biological substances status: Secondary | ICD-10-CM | POA: Diagnosis not present

## 2017-12-11 DIAGNOSIS — Z923 Personal history of irradiation: Secondary | ICD-10-CM | POA: Insufficient documentation

## 2017-12-11 DIAGNOSIS — Z8249 Family history of ischemic heart disease and other diseases of the circulatory system: Secondary | ICD-10-CM | POA: Insufficient documentation

## 2017-12-11 DIAGNOSIS — I700 Atherosclerosis of aorta: Secondary | ICD-10-CM | POA: Insufficient documentation

## 2017-12-11 DIAGNOSIS — I5032 Chronic diastolic (congestive) heart failure: Secondary | ICD-10-CM

## 2017-12-11 DIAGNOSIS — I481 Persistent atrial fibrillation: Secondary | ICD-10-CM | POA: Diagnosis not present

## 2017-12-11 DIAGNOSIS — C3490 Malignant neoplasm of unspecified part of unspecified bronchus or lung: Secondary | ICD-10-CM | POA: Diagnosis not present

## 2017-12-11 DIAGNOSIS — I341 Nonrheumatic mitral (valve) prolapse: Secondary | ICD-10-CM | POA: Diagnosis not present

## 2017-12-11 DIAGNOSIS — R0600 Dyspnea, unspecified: Secondary | ICD-10-CM

## 2017-12-11 DIAGNOSIS — N179 Acute kidney failure, unspecified: Secondary | ICD-10-CM

## 2017-12-11 DIAGNOSIS — Z87891 Personal history of nicotine dependence: Secondary | ICD-10-CM | POA: Insufficient documentation

## 2017-12-11 DIAGNOSIS — J441 Chronic obstructive pulmonary disease with (acute) exacerbation: Secondary | ICD-10-CM

## 2017-12-11 DIAGNOSIS — J90 Pleural effusion, not elsewhere classified: Secondary | ICD-10-CM | POA: Diagnosis not present

## 2017-12-11 DIAGNOSIS — R011 Cardiac murmur, unspecified: Secondary | ICD-10-CM | POA: Diagnosis not present

## 2017-12-11 DIAGNOSIS — J948 Other specified pleural conditions: Secondary | ICD-10-CM

## 2017-12-11 DIAGNOSIS — I130 Hypertensive heart and chronic kidney disease with heart failure and stage 1 through stage 4 chronic kidney disease, or unspecified chronic kidney disease: Secondary | ICD-10-CM | POA: Diagnosis not present

## 2017-12-11 DIAGNOSIS — N183 Chronic kidney disease, stage 3 (moderate): Secondary | ICD-10-CM | POA: Insufficient documentation

## 2017-12-11 DIAGNOSIS — K219 Gastro-esophageal reflux disease without esophagitis: Secondary | ICD-10-CM | POA: Insufficient documentation

## 2017-12-11 DIAGNOSIS — I959 Hypotension, unspecified: Secondary | ICD-10-CM

## 2017-12-11 DIAGNOSIS — J984 Other disorders of lung: Secondary | ICD-10-CM | POA: Diagnosis not present

## 2017-12-11 DIAGNOSIS — Z882 Allergy status to sulfonamides status: Secondary | ICD-10-CM

## 2017-12-11 DIAGNOSIS — I4891 Unspecified atrial fibrillation: Secondary | ICD-10-CM | POA: Diagnosis not present

## 2017-12-11 DIAGNOSIS — Z79899 Other long term (current) drug therapy: Secondary | ICD-10-CM | POA: Insufficient documentation

## 2017-12-11 DIAGNOSIS — Z7901 Long term (current) use of anticoagulants: Secondary | ICD-10-CM | POA: Diagnosis not present

## 2017-12-11 DIAGNOSIS — E876 Hypokalemia: Secondary | ICD-10-CM | POA: Insufficient documentation

## 2017-12-11 DIAGNOSIS — I2510 Atherosclerotic heart disease of native coronary artery without angina pectoris: Secondary | ICD-10-CM | POA: Diagnosis not present

## 2017-12-11 DIAGNOSIS — Z833 Family history of diabetes mellitus: Secondary | ICD-10-CM

## 2017-12-11 DIAGNOSIS — Z803 Family history of malignant neoplasm of breast: Secondary | ICD-10-CM

## 2017-12-11 DIAGNOSIS — I4510 Unspecified right bundle-branch block: Secondary | ICD-10-CM

## 2017-12-11 DIAGNOSIS — Z8589 Personal history of malignant neoplasm of other organs and systems: Secondary | ICD-10-CM | POA: Insufficient documentation

## 2017-12-11 DIAGNOSIS — I5030 Unspecified diastolic (congestive) heart failure: Secondary | ICD-10-CM | POA: Diagnosis not present

## 2017-12-11 DIAGNOSIS — R531 Weakness: Secondary | ICD-10-CM

## 2017-12-11 DIAGNOSIS — E860 Dehydration: Secondary | ICD-10-CM | POA: Diagnosis present

## 2017-12-11 DIAGNOSIS — J449 Chronic obstructive pulmonary disease, unspecified: Secondary | ICD-10-CM

## 2017-12-11 LAB — BASIC METABOLIC PANEL
ANION GAP: 15 (ref 5–15)
BUN: 49 mg/dL — AB (ref 6–20)
CALCIUM: 8.3 mg/dL — AB (ref 8.9–10.3)
CO2: 30 mmol/L (ref 22–32)
Chloride: 88 mmol/L — ABNORMAL LOW (ref 101–111)
Creatinine, Ser: 3.26 mg/dL — ABNORMAL HIGH (ref 0.44–1.00)
GFR calc Af Amer: 15 mL/min — ABNORMAL LOW (ref >60)
GFR, EST NON AFRICAN AMERICAN: 13 mL/min — AB (ref >60)
GLUCOSE: 96 mg/dL (ref 65–99)
Potassium: 3 mmol/L — ABNORMAL LOW (ref 3.5–5.1)
Sodium: 133 mmol/L — ABNORMAL LOW (ref 135–145)

## 2017-12-11 LAB — URINALYSIS, ROUTINE W REFLEX MICROSCOPIC
Bilirubin Urine: NEGATIVE
Glucose, UA: NEGATIVE mg/dL
KETONES UR: NEGATIVE mg/dL
Leukocytes, UA: NEGATIVE
Nitrite: NEGATIVE
PROTEIN: NEGATIVE mg/dL
Specific Gravity, Urine: 1.008 (ref 1.005–1.030)
pH: 5 (ref 5.0–8.0)

## 2017-12-11 LAB — CBC
HEMATOCRIT: 36.2 % (ref 36.0–46.0)
HEMOGLOBIN: 12.2 g/dL (ref 12.0–15.0)
MCH: 31.4 pg (ref 26.0–34.0)
MCHC: 33.7 g/dL (ref 30.0–36.0)
MCV: 93.1 fL (ref 78.0–100.0)
Platelets: 151 10*3/uL (ref 150–400)
RBC: 3.89 MIL/uL (ref 3.87–5.11)
RDW: 13.6 % (ref 11.5–15.5)
WBC: 6.3 10*3/uL (ref 4.0–10.5)

## 2017-12-11 LAB — I-STAT TROPONIN, ED: Troponin i, poc: 0.05 ng/mL (ref 0.00–0.08)

## 2017-12-11 LAB — BRAIN NATRIURETIC PEPTIDE: B NATRIURETIC PEPTIDE 5: 206.4 pg/mL — AB (ref 0.0–100.0)

## 2017-12-11 LAB — MAGNESIUM: Magnesium: 2.2 mg/dL (ref 1.7–2.4)

## 2017-12-11 LAB — TSH: TSH: 5.723 u[IU]/mL — AB (ref 0.350–4.500)

## 2017-12-11 MED ORDER — IPRATROPIUM-ALBUTEROL 0.5-2.5 (3) MG/3ML IN SOLN: 3.0000 mL | Freq: Three times a day (TID) | RESPIRATORY_TRACT | Status: DC

## 2017-12-11 MED ORDER — SODIUM CHLORIDE 0.9 % IV BOLUS (SEPSIS)
500.0000 mL | Freq: Once | INTRAVENOUS | Status: AC
  Administered 2017-12-11: 500 mL via INTRAVENOUS

## 2017-12-11 MED ORDER — AZITHROMYCIN 500 MG PO TABS
250.0000 mg | ORAL_TABLET | Freq: Every day | ORAL | Status: DC
  Administered 2017-12-12 – 2017-12-13 (×2): 250 mg via ORAL
  Filled 2017-12-11 (×2): qty 1

## 2017-12-11 MED ORDER — IPRATROPIUM-ALBUTEROL 0.5-2.5 (3) MG/3ML IN SOLN
3.0000 mL | Freq: Four times a day (QID) | RESPIRATORY_TRACT | Status: DC
  Administered 2017-12-11: 3 mL via RESPIRATORY_TRACT
  Filled 2017-12-11: qty 3

## 2017-12-11 MED ORDER — APIXABAN 5 MG PO TABS
5.0000 mg | ORAL_TABLET | Freq: Two times a day (BID) | ORAL | Status: DC
  Administered 2017-12-11 – 2017-12-13 (×4): 5 mg via ORAL
  Filled 2017-12-11 (×4): qty 1

## 2017-12-11 MED ORDER — AMIODARONE HCL 200 MG PO TABS
200.0000 mg | ORAL_TABLET | Freq: Two times a day (BID) | ORAL | Status: DC
  Administered 2017-12-11 – 2017-12-13 (×4): 200 mg via ORAL
  Filled 2017-12-11 (×4): qty 1

## 2017-12-11 MED ORDER — WHITE PETROLATUM EX OINT
TOPICAL_OINTMENT | CUTANEOUS | Status: AC
  Administered 2017-12-11: 17:00:00
  Filled 2017-12-11: qty 28.35

## 2017-12-11 MED ORDER — POTASSIUM CHLORIDE CRYS ER 20 MEQ PO TBCR
40.0000 meq | EXTENDED_RELEASE_TABLET | Freq: Two times a day (BID) | ORAL | Status: AC
  Administered 2017-12-11 – 2017-12-12 (×2): 40 meq via ORAL
  Filled 2017-12-11 (×2): qty 2

## 2017-12-11 MED ORDER — BENZONATATE 100 MG PO CAPS
100.0000 mg | ORAL_CAPSULE | Freq: Two times a day (BID) | ORAL | Status: DC | PRN
  Administered 2017-12-12 (×2): 100 mg via ORAL
  Filled 2017-12-11 (×2): qty 1

## 2017-12-11 MED ORDER — ACETAMINOPHEN 325 MG PO TABS: 650.0000 mg | ORAL_TABLET | Freq: Four times a day (QID) | ORAL | Status: DC | PRN

## 2017-12-11 MED ORDER — PREDNISONE 50 MG PO TABS
50.0000 mg | ORAL_TABLET | Freq: Every day | ORAL | Status: DC
  Administered 2017-12-11 – 2017-12-13 (×3): 50 mg via ORAL
  Filled 2017-12-11 (×3): qty 1

## 2017-12-11 MED ORDER — TRAZODONE HCL 50 MG PO TABS
50.0000 mg | ORAL_TABLET | Freq: Once | ORAL | Status: AC | PRN
  Administered 2017-12-12: 50 mg via ORAL
  Filled 2017-12-11: qty 1

## 2017-12-11 MED ORDER — POLYETHYLENE GLYCOL 3350 17 G PO PACK: 17.0000 g | PACK | Freq: Every day | ORAL | Status: DC | PRN

## 2017-12-11 MED ORDER — AZITHROMYCIN 500 MG PO TABS
500.0000 mg | ORAL_TABLET | Freq: Every day | ORAL | Status: AC
  Administered 2017-12-11: 500 mg via ORAL
  Filled 2017-12-11: qty 1

## 2017-12-11 MED ORDER — HYPROMELLOSE (GONIOSCOPIC) 2.5 % OP SOLN: 1.0000 [drp] | OPHTHALMIC | Status: DC | PRN

## 2017-12-11 MED ORDER — ACETAMINOPHEN 650 MG RE SUPP: 650.0000 mg | Freq: Four times a day (QID) | RECTAL | Status: DC | PRN

## 2017-12-11 NOTE — ED Notes (Signed)
Pt refusing to provide urine sample until she gets lunch tray

## 2017-12-11 NOTE — ED Notes (Signed)
Patient transported to X-ray 

## 2017-12-11 NOTE — ED Provider Notes (Signed)
[facility] EMERGENCY DEPARTMENT Provider Note   CSN: [id number] Arrival date & time: 12/11/17  9924     History   Chief Complaint Chief Complaint  Patient presents with  . Hypotension  . Weakness  . Cough    HPI Diane [name] is a 77 y.o. female.  Patient is a 77 year old female with a history of coronary artery disease, CHF, recent admission for atrial fibrillation status post cardioversion and discharged 2 days ago who went to her nephrologist [name] today for follow-up and complaints of generalized weakness.  Patient has been extremely weak since she has been home with difficulty even getting up and walking because of significant weakness.  She also was standing feels lightheaded and dizzy but no syncope.  She denies any chest pain or shortness of breath.  She does note since 2 days prior to discharge from the hospital she started developing a cough.  She states this cough does produce some yellow sputum here and there but she denies fever or shortness of breath.  She states all the swelling she had in her lower extremities is now completely resolved.  She had been taking 40 mg of torsemide at home twice daily as instructed and new medications she had started during hospitalization or amiodarone which she continues to take.  She denies any diarrhea, abdominal pain or vomiting.  She has been eating at home.  When she was at the nephrologist office today she was found to be hypotensive and they sent her here for further evaluation.   The history is provided by the patient.  Weakness  Primary symptoms comment: Generalized weakness.  Cough     Past Medical History:  Diagnosis Date  . Aortic atherosclerosis ([place]) 12/03/2017  . CAD (coronary artery disease), native coronary artery 12/03/2017   Noted on CT scan by calcification   . CHF (congestive heart failure) ([place])   . Chronic diastolic heart failure ([place]) 12/03/2017  . GERD (gastroesophageal reflux disease)   .  Heart murmur   . History of blood transfusion    "related to chemo" (12/05/2017)  . Hypertension   . Hypertensive heart disease 12/03/2017  . Kidney disease, chronic, stage III (GFR 30-59 ml/min) ([place]) 12/03/2017  . Lung cancer ([place]) dx'd 02/2010  . Mitral valve prolapse   . Morbid obesity ([place]) 12/03/2017  . 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 Persistent atrial fibrillation ([place]) 12/03/2017  . Restrictive lung disease 05/23/2017    Patient Active Problem List   Diagnosis Date Noted  . Acute on chronic diastolic CHF (congestive heart failure) ([place]) 12/03/2017  . Chronic diastolic heart failure ([place]) 12/03/2017  . Persistent atrial fibrillation ([place]) 12/03/2017  . Current use of long term anticoagulation 12/03/2017  . Morbid obesity ([place]) 12/03/2017  . Kidney disease, chronic, stage III (GFR 30-59 ml/min) ([facility]) 12/03/2017  . Hypertensive heart disease 12/03/2017  . CAD (coronary artery disease), native coronary artery 12/03/2017  . Aortic atherosclerosis ([place]) 12/03/2017  . Restrictive lung disease 05/23/2017  . Port catheter in place 2020/07/2716  . Cancer of right lung parenchyma ([place]) 10/17/2011    Past Surgical History:  Procedure Laterality Date  . CARDIOVERSION N/A 12/06/2017   Procedure: CARDIOVERSION;  Surgeon: Jacolyn Reedy, MD;  Location: [facility] ENDOSCOPY;  Service: Cardiovascular;  Laterality: N/A;  . CATARACT EXTRACTION W/ INTRAOCULAR LENS  IMPLANT, BILATERAL Bilateral   . DERMABRASION  ~ 1966   "face; for acne"  . PAROTID GLAND TUMOR EXCISION Left   .  PORTACATH PLACEMENT  11/03/08-[place]   tip in mid SVC-[place]. [place]  . THORACENTESIS Right   . TONSILLECTOMY      OB History    No data available       Home Medications    Prior to Admission medications   Medication Sig Start Date End Date Taking? Authorizing Provider  amiodarone (PACERONE) 200 MG tablet Take 1 tablet (200 mg total) by mouth 2 (two) times daily. 12/07/17  Yes Jacolyn Reedy, MD  apixaban  (ELIQUIS) 5 MG TABS tablet Take 5 mg by mouth 2 (two) times daily.   Yes [provider]  calcitRIOL (ROCALTROL) 0.25 MCG capsule Take 0.25 mcg by mouth every Monday, Wednesday, and Friday. 06/25/12  Yes [provider]  carvedilol (COREG) 25 MG tablet Take 25 mg by mouth 2 (two) times daily. 10/22/17  Yes [provider]  hydroxypropyl methylcellulose / hypromellose (ISOPTO TEARS / GONIOVISC) 2.5 % ophthalmic solution Place 1 drop into both eyes as needed for dry eyes.   Yes [provider]  polyethylene glycol (MIRALAX / GLYCOLAX) packet Take 17 g by mouth daily as needed for mild constipation. Reported on 06/05/2016   Yes [provider]  torsemide (DEMADEX) 20 MG tablet Take 2 tablets (40 mg total) by mouth 2 (two) times daily. 12/07/17  Yes Jacolyn Reedy, MD    Family History Family History  Problem Relation Age of Onset  . Breast cancer Mother   . Heart attack Mother   . Diabetes Father   . Heart attack Father   . Diabetes Brother   . COPD Cousin     Social History Social History   Tobacco Use  . Smoking status: Former Smoker    Packs/day: 2.00    Years: 40.00    Pack years: 80.00    Types: Cigarettes    Last attempt to quit: 11/27/2001    Years since quitting: 16.0  . Smokeless tobacco: Never Used  Substance Use Topics  . Alcohol use: Yes    Comment: 12/05/2017 "once in a great while I might have a drink"  . Drug use: No     Allergies   Septra ds [sulfamethoxazole-trimethoprim]   Review of Systems Review of Systems  Respiratory: Positive for cough.   Neurological: Positive for weakness.  All other systems reviewed and are negative.    Physical Exam Updated Vital Signs BP (!) 89/48 (BP Location: Right Arm)   Pulse 76   Temp 98.9 F (37.2 C) (Oral)   Resp 16   Ht 5\' 6"  (1.676 m)   Wt 111.6 kg (246 lb)   SpO2 94%   BMI 39.71 kg/m   Physical Exam  Constitutional: She is oriented to person, place, and time.  She appears well-developed and well-nourished. No distress.  HENT:  Head: Normocephalic and atraumatic.  Mouth/Throat: Oropharynx is clear and moist.  Eyes: Conjunctivae and EOM are normal. Pupils are equal, round, and reactive to light.  Neck: Normal range of motion. Neck supple.  Cardiovascular: Normal rate, regular rhythm and intact distal pulses.  No murmur heard. Pulmonary/Chest: Effort normal. No respiratory distress. She has wheezes. She has no rales.  Wheezing most notable on the right side of the lungs but some mild rales in the left base with a wet sounding cough  Abdominal: Soft. She exhibits no distension. There is no tenderness. There is no rebound and no guarding.  Musculoskeletal: Normal range of motion. She exhibits no edema or tenderness.  No edema noted  in the lower extremities.  Neurological: She is alert and oriented to person, place, and time.  Skin: Skin is warm and dry. Capillary refill takes 2 to 3 seconds. No rash noted. No erythema.  Psychiatric: She has a normal mood and affect. Her behavior is normal.  Nursing note and vitals reviewed.    ED Treatments / Results  Labs (all labs ordered are listed, but only abnormal results are displayed) Labs Reviewed  BASIC METABOLIC PANEL - Abnormal; Notable for the following components:      Result Value   Sodium 133 (*)    Potassium 3.0 (*)    Chloride 88 (*)    BUN 49 (*)    Creatinine, Ser 3.26 (*)    Calcium 8.3 (*)    GFR calc non Af Amer 13 (*)    GFR calc Af Amer 15 (*)    All other components within normal limits  BRAIN NATRIURETIC PEPTIDE - Abnormal; Notable for the following components:   B Natriuretic Peptide 206.4 (*)    All other components within normal limits  CBC  MAGNESIUM  URINALYSIS, ROUTINE W REFLEX MICROSCOPIC  I-STAT TROPONIN, ED  CBG MONITORING, ED    EKG  EKG Interpretation  Date/Time:  Tuesday December 11 2017 10:44:21 EST Ventricular Rate:  76 PR Interval:  166 QRS  Duration: 142 QT Interval:  564 QTC Calculation: 634 R Axis:   60 Text Interpretation:  Normal sinus rhythm Right bundle branch block No significant change since last tracing Confirmed by Blanchie Dessert [id number]) on 12/11/2017 11:15:31 AM       Radiology Dg Chest 2 View  Result Date: 12/11/2017 CLINICAL DATA:  Cough. EXAM: CHEST  2 VIEW COMPARISON:  Chest x-ray dated December 04, 2017. FINDINGS: Unchanged left chest wall port catheter with the tip in the mid SVC. Stable cardiomegaly. Unchanged volume loss in the right hemithorax with moderate right pleural effusion and chronic right lower lobe consolidation. The left lung is clear. No pneumothorax. No acute osseous abnormality. IMPRESSION: 1. Unchanged moderate right pleural effusion and chronic right lower lobe consolidation. 2. Stable cardiomegaly. Electronically Signed   By: Titus Dubin M.D.   On: 12/11/2017 12:06    Procedures Procedures (including critical care time)  Medications Ordered in ED Medications - No data to display   Initial Impression / Assessment and Plan / ED Course  I have reviewed the triage vital signs and the nursing notes.  Pertinent labs & imaging results that were available during my care of the patient were reviewed by me and considered in my medical decision making (see chart for details).     Patient with right recent hospitalization for cardioversion of atrial fibrillation and CHF presenting today from nephrology office for generalized weakness and hypotension.  Patient also notes 4 days of cough that started while she was still hospitalized.  She denies any infectious symptoms and states at this point she has no swelling in her lower extremities.  Patient has still been taking 40 mg of torsemide twice daily as instructed.  She denies any diarrhea, nausea and vomiting and has been taking oral meds and eating and drinking.  Initially in triage patient's blood pressure was 89/48 however here while laying  down its between 115-120.  Patient is orthostatic with near syncope but denies any full syncope.  Concern for dehydration, worsening renal function, electrolyte abnormality as the cause for her weakness.  Lower suspicion for ACS, pneumonia or CHF. Chest x-ray, CBC, BMP, BNP, troponin pending.  11:35 AM Patient CBC within normal limits without signs of acute anemia.  Patient's BMP today shows acute kidney injury on top of chronic renal insufficiency.  Creatinine today is 3.26 from her baseline of 2.39 and a potassium of 3.0 today.  Patient is most likely over diuresed.  We will give gentle fluids at and admit for further care.  CXR unchanged from prior.  BNP improved.  Final Clinical Impressions(s) / ED Diagnoses   Final diagnoses:  Dehydration  Weakness  AKI (acute kidney injury) ([place])  Hypokalemia    ED Discharge Orders    None       Blanchie Dessert, MD 12/11/17 1327

## 2017-12-11 NOTE — ED Triage Notes (Signed)
Recently discharge 2 days ago seen Doctor today sent to the ED for admission for hypotension (85/52), dehydration, and since being discharge productive cough yellow.  Alert answering and following commands appropriate.

## 2017-12-11 NOTE — Progress Notes (Signed)
Admission note:  Arrival Method: Patient came on stretcher from ED. Mental Orientation:  Alert and oriented x 4. Telemetry: Tele box 5, NSR, CCMD notified. Assessment: See doc flow sheets. Skin: warm, dry and intact, no open areas noted, 1+ edema noted in both lower extremities. IV: Right hand saline lock. Pain: Denies any pain. Tubes: N/A Safety Measures: bed in low position, call bell and phone within reach, refused bed alarm. Fall Prevention Safety Plan: Reviewed the plan, verbalized understanding. Admission Screening: In progress. 6700 Orientation: Patient has been oriented to the unit, staff and to the room.

## 2017-12-11 NOTE — H&P (Signed)
Date: 12/11/2017               Patient Name:  Diane [name] MRN: [id number]  DOB: [birth year] Age / Sex: 77 y.o., female   PCP: [name], MD         Medical Service: Internal Medicine Teaching Service         Attending Physician: Dr. Evette Doffing, Mallie Mussel, *    First Contact: Dr. Tarri Abernethy Pager: [phone]  Second Contact: Dr. Danford Bad Pager: [phone]       After Hours (After 5p/  First Contact Pager: [phone]  weekends / holidays): Second Contact Pager: [phone]   Chief Complaint: Hypotension   History of Present Illness: Diane [name] is a 77 year old female with history of CAD, heart failure preserved ejection fraction, COPD, lung cancer status post chemotherapy, and atrial fibrillation status post cardioversion on 1/11 who presented to the ED at the urging of her nephrologist Dr. Zanovia Rotz Mend for generalized weakness and hypotension. Patient states that last week she was admitted for cardioversion and underwent the procedure without any complications. She was subsequently discharged home. Since discharge she has felt like she has had a virus and has become weak. Her husband was recently sick and she feels that she caught something from him. Initially she had diarrhea that subsequently resolved and now she has worsening of her chronic cough but denies sputum change. She states that this weakness is worse over the last couple of days. This morning when she woke up she felt lightheaded and dizzy. Subsequently went to her appointment with Dr. Muriel Hannold Mend and was found to be hypotensive.  As mentioned above she does endorse increasing cough, shortness of breath, and weakness. She denies subjective fevers and chills, nausea/vomiting, abdominal pain, myalgias, arthralgias, dysuria, hematuria, further diarrhea. She has a pertinent social history of greater than 100 pack year. States that she has been told she may have COPD.  She she does have a history of lung cancer and has been off chemotherapy  regimen for approximately 3 years. She regularly follows up with [name] and states that in the past she has felt prior and he just treated her with antibiotics as an outpatient.  Meds:  Current Meds  Medication Sig  . amiodarone (PACERONE) 200 MG tablet Take 1 tablet (200 mg total) by mouth 2 (two) times daily.  Marland Kitchen apixaban (ELIQUIS) 5 MG TABS tablet Take 5 mg by mouth 2 (two) times daily.  . calcitRIOL (ROCALTROL) 0.25 MCG capsule Take 0.25 mcg by mouth every Monday, Wednesday, and Friday.  . carvedilol (COREG) 25 MG tablet Take 25 mg by mouth 2 (two) times daily.  . hydroxypropyl methylcellulose / hypromellose (ISOPTO TEARS / GONIOVISC) 2.5 % ophthalmic solution Place 1 drop into both eyes as needed for dry eyes.  . polyethylene glycol (MIRALAX / GLYCOLAX) packet Take 17 g by mouth daily as needed for mild constipation. Reported on 06/05/2016  . torsemide (DEMADEX) 20 MG tablet Take 2 tablets (40 mg total) by mouth 2 (two) times daily.   Allergies: Allergies as of 12/11/2017 - Review Complete 12/11/2017  Allergen Reaction Noted  . Septra ds [sulfamethoxazole-trimethoprim] Nausea Only 07/14/2014   Past Medical History:  Diagnosis Date  . Aortic atherosclerosis ([place]) 12/03/2017  . CAD (coronary artery disease), native coronary artery 12/03/2017   Noted on CT scan by calcification   . CHF (congestive heart failure) ([place])   . Chronic diastolic heart failure ([place]) 12/03/2017  . GERD (gastroesophageal reflux disease)   .  Heart murmur   . History of blood transfusion    "related to chemo" (12/05/2017)  . Hypertension   . Hypertensive heart disease 12/03/2017  . Kidney disease, chronic, stage III (GFR 30-59 ml/min) ([place]) 12/03/2017  . Lung cancer ([place]) dx'd 02/2010  . Mitral valve prolapse   . Morbid obesity ([place]) 12/03/2017  . 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 Persistent atrial fibrillation ([place]) 12/03/2017  . Restrictive lung disease 05/23/2017   Family History:  Mother: + Breast  Cancer, CAD Father: + DM, CAD Brother: + DM  Social History:  Lives with her husband  Former smoker, >100 pack year history  Denies excessive use of EtOH or the use of illicit substances.   Review of Systems: A complete ROS was negative except as per HPI.   Physical Exam: Blood pressure 115/64, pulse 86, temperature 98.9 F (37.2 C), temperature source Oral, resp. rate (!) 25, height 5\' 6"  (1.676 m), weight 246 lb (111.6 kg), SpO2 92 %.  General: Well-nourished elderly female in no acute distress HENT: Normocephalic, atraumatic, moist mucous membranes Pulm: Good air movement, rhonchi in the lower lung fields, diffuse wheezing throughout CV: Regular rate and rhythm, no murmurs or rubs heard Abdomen: Active bowel sounds, soft, nondistended, no tenderness palpation Extremities: Trace to mild pitting edema of the lower extremities. Pulses palpable in upper extremities bilaterally Skin: Warm and dry Neuro: Alert and oriented x3  EKG: personally reviewed: my interpretation is sinus rhythm with indeterminate axis.  Widened QRS with right bundle branch block. Unchanged compared to EKG on 1/11  CXR: personally reviewed: my interpretation is penetration, but poor rotation. No bony or soft tissue abnormalities. Port in place. No apparent cardiomegaly. Right-sided pleural effusion.  Assessment & Plan by Problem: Active Problems:   COPD exacerbation (HCC)  COPD exacerbation.  Diane [name] is a 77 year old female with a history of heart failure preserved ejection fraction and COPD who presented to the ED with generalized weakness and hypotension. She endorses increasing cough and shortness of breath. Physical exam significant for diffuse wheezing and rhonchi in the lower lung fields. PFTs illustrating a reduced FEV1 to FVC ratio, with increased residual volume, and decreased total lung capacity. No new opacity or infiltrate noted on chest x-ray. Patient does not appear volume overloaded and  presentation is inconsistent with heart failure exacerbation. We will treat as a COPD exacerbation. - Scheduled DuoNebs every 6 hours while awake  - Albuterol PRN  - Start Prednisone 50 mg for 5 days  - Start Azithromycin 500 mg today followed by 250 mg for 4 days   Hypotension.  Patient presented to the ED with hypotension after increase in her diuretic therapy and diarrhea. She likely had excessive free water loss due to this. Her labs also illustrated that elevated bicarb which would go in favor of a contraction alkalosis and slight elevation in her creatinine secondary to over diuresing. She states that she was supposed to decrease her torsemide to twice daily today. She received a 500 mL bolus in the ED and her blood pressure responded well. - Typically hypertensive, will hold blood pressure medicine - Hold torsemide - Checking TSH - Continue to monitor.  Heart failure preserved ejection fraction - Outpatient medications include: Carvedilol and torsemide - We will hold until blood pressure improves  Atrial fibrillation status post cardioversion - Currently in sinus rhythm - Continue amiodarone and Eliquis - Hold carvedilol  CKD stage III - Baseline GFR 18-20, today 13 - Holding torsemide - No  acute indications for HD - Continue to monitor  Hypokalemia. - Replacing  Diet: Heart healthy DVT prophylaxis: On Eliquis CODE STATUS: Full code  Dispo: Admit patient to Observation with expected length of stay less than 2 midnights.  Signed: Ina Homes, MD 12/11/2017, 3:48 PM  My Pager: [phone]

## 2017-12-12 ENCOUNTER — Observation Stay (HOSPITAL_BASED_OUTPATIENT_CLINIC_OR_DEPARTMENT_OTHER): Payer: Medicare Other

## 2017-12-12 DIAGNOSIS — I509 Heart failure, unspecified: Secondary | ICD-10-CM | POA: Diagnosis not present

## 2017-12-12 DIAGNOSIS — R946 Abnormal results of thyroid function studies: Secondary | ICD-10-CM | POA: Diagnosis not present

## 2017-12-12 DIAGNOSIS — I4891 Unspecified atrial fibrillation: Secondary | ICD-10-CM | POA: Diagnosis not present

## 2017-12-12 DIAGNOSIS — J441 Chronic obstructive pulmonary disease with (acute) exacerbation: Secondary | ICD-10-CM | POA: Diagnosis not present

## 2017-12-12 DIAGNOSIS — Z79899 Other long term (current) drug therapy: Secondary | ICD-10-CM

## 2017-12-12 DIAGNOSIS — I5032 Chronic diastolic (congestive) heart failure: Secondary | ICD-10-CM | POA: Diagnosis not present

## 2017-12-12 DIAGNOSIS — I5030 Unspecified diastolic (congestive) heart failure: Secondary | ICD-10-CM | POA: Diagnosis not present

## 2017-12-12 DIAGNOSIS — Z882 Allergy status to sulfonamides status: Secondary | ICD-10-CM | POA: Diagnosis not present

## 2017-12-12 DIAGNOSIS — E876 Hypokalemia: Secondary | ICD-10-CM | POA: Diagnosis not present

## 2017-12-12 DIAGNOSIS — Z7901 Long term (current) use of anticoagulants: Secondary | ICD-10-CM | POA: Diagnosis not present

## 2017-12-12 DIAGNOSIS — I959 Hypotension, unspecified: Secondary | ICD-10-CM | POA: Diagnosis not present

## 2017-12-12 DIAGNOSIS — N183 Chronic kidney disease, stage 3 (moderate): Secondary | ICD-10-CM | POA: Diagnosis not present

## 2017-12-12 LAB — CBC
HCT: 35.7 % — ABNORMAL LOW (ref 36.0–46.0)
HEMOGLOBIN: 11.9 g/dL — AB (ref 12.0–15.0)
MCH: 31.2 pg (ref 26.0–34.0)
MCHC: 33.3 g/dL (ref 30.0–36.0)
MCV: 93.5 fL (ref 78.0–100.0)
Platelets: 152 10*3/uL (ref 150–400)
RBC: 3.82 MIL/uL — AB (ref 3.87–5.11)
RDW: 13.5 % (ref 11.5–15.5)
WBC: 4 10*3/uL (ref 4.0–10.5)

## 2017-12-12 LAB — COMPREHENSIVE METABOLIC PANEL
ALT: 19 U/L (ref 14–54)
AST: 32 U/L (ref 15–41)
Albumin: 2.9 g/dL — ABNORMAL LOW (ref 3.5–5.0)
Alkaline Phosphatase: 54 U/L (ref 38–126)
Anion gap: 15 (ref 5–15)
BUN: 49 mg/dL — ABNORMAL HIGH (ref 6–20)
CHLORIDE: 92 mmol/L — AB (ref 101–111)
CO2: 29 mmol/L (ref 22–32)
Calcium: 8.6 mg/dL — ABNORMAL LOW (ref 8.9–10.3)
Creatinine, Ser: 2.94 mg/dL — ABNORMAL HIGH (ref 0.44–1.00)
GFR calc non Af Amer: 14 mL/min — ABNORMAL LOW (ref >60)
GFR, EST AFRICAN AMERICAN: 17 mL/min — AB (ref >60)
Glucose, Bld: 277 mg/dL — ABNORMAL HIGH (ref 65–99)
POTASSIUM: 3.2 mmol/L — AB (ref 3.5–5.1)
SODIUM: 136 mmol/L (ref 135–145)
Total Bilirubin: 0.9 mg/dL (ref 0.3–1.2)
Total Protein: 6.3 g/dL — ABNORMAL LOW (ref 6.5–8.1)

## 2017-12-12 LAB — ECHOCARDIOGRAM COMPLETE
Height: 66 in
Weight: 3937.1 oz

## 2017-12-12 MED ORDER — POTASSIUM CHLORIDE CRYS ER 20 MEQ PO TBCR
40.0000 meq | EXTENDED_RELEASE_TABLET | Freq: Once | ORAL | Status: AC
  Administered 2017-12-12: 40 meq via ORAL
  Filled 2017-12-12: qty 2

## 2017-12-12 MED ORDER — CARVEDILOL 25 MG PO TABS
25.0000 mg | ORAL_TABLET | Freq: Two times a day (BID) | ORAL | Status: DC
  Administered 2017-12-12 – 2017-12-13 (×3): 25 mg via ORAL
  Filled 2017-12-12 (×3): qty 1

## 2017-12-12 MED ORDER — TRAZODONE HCL 50 MG PO TABS
50.0000 mg | ORAL_TABLET | Freq: Once | ORAL | Status: AC | PRN
  Administered 2017-12-12: 50 mg via ORAL
  Filled 2017-12-12: qty 1

## 2017-12-12 MED ORDER — IPRATROPIUM-ALBUTEROL 0.5-2.5 (3) MG/3ML IN SOLN: 3.0000 mL | RESPIRATORY_TRACT | Status: DC | PRN

## 2017-12-12 NOTE — Progress Notes (Signed)
  Echocardiogram 2D Echocardiogram has been performed.  Diane [name] Diane [name] 12/12/2017, 5:07 PM

## 2017-12-12 NOTE — Evaluation (Signed)
Physical Therapy Evaluation Patient Details Name: Diane [name] MRN: [id number] DOB: 12-11-1940 Today's Date: 12/12/2017   History of Present Illness  Pt is a 77 y/o female admitted secondary to COPD exacerbation and hypotension. Imaging revealed R pleaural effusion and R lower lobe consolidation. PMH includes CAD, HTN, CKD, CHF, a fib, and R lung cancer.   Clinical Impression  Pt admitted secondary to problem above with deficits below. Pt tolerated ambulation with RW and stair navigation well, with only slight SOB; DOE at 1/4. Oxygen sats dropped to 87% at end of gait training, however, quickly returned to 93% with seated rest and pursed lip breathing. Otherwise sats remained at 90-93% on RA throughout gait. Will continue to follow acutely to maximize functional mobility independence and safety.     Follow Up Recommendations Home health PT;Supervision for mobility/OOB(pt will likely refuse )    Equipment Recommendations  None recommended by PT    Recommendations for Other Services       Precautions / Restrictions Precautions Precautions: Fall Restrictions Weight Bearing Restrictions: No      Mobility  Bed Mobility Overal bed mobility: Modified Independent             General bed mobility comments: Increased time, however, no assist required.   Transfers Overall transfer level: Needs assistance Equipment used: Rolling walker (2 wheeled) Transfers: Sit to/from Stand Sit to Stand: Min guard;From elevated surface         General transfer comment: Min guard for safety and elevated surface to stand. Verbal cues for safe hand placement.   Ambulation/Gait Ambulation/Gait assistance: Min guard Ambulation Distance (Feet): 150 Feet Assistive device: Rolling walker (2 wheeled) Gait Pattern/deviations: Step-through pattern;Decreased stride length;Trunk flexed Gait velocity: Decreased  Gait velocity interpretation: Below normal speed for age/gender General Gait Details:  Slow, overall steady gait with use of RW. Required cues for upright posture and proximity to device as pt wanted to step outside of RW, especially during turns. Slight SOB noted. Oxygen sats decreased to 87% on RA at end of gait, however, improved to 93% with seated rest and cues for pursed lip breathing. Educated about importance of using pursed lip breathing and activity pacing with gait at home.   Stairs Stairs: Yes Stairs assistance: Min assist;Min guard Stair Management: Two rails;Step to pattern;Forwards Number of Stairs: 2 General stair comments: Pt requesting to use rails despite education to practice as she was at home. Min to min guard assist for steadying assist. Demonstrated appropriate step to technique. Reports husband usually assists with stair navigation at home.   Wheelchair Mobility    Modified Rankin (Stroke Patients Only)       Balance Overall balance assessment: Needs assistance Sitting-balance support: No upper extremity supported;Feet supported Sitting balance-Leahy Scale: Good     Standing balance support: Bilateral upper extremity supported;During functional activity Standing balance-Leahy Scale: Poor Standing balance comment: Reliant on BUE support.                              Pertinent Vitals/Pain Pain Assessment: No/denies pain    Home Living Family/patient expects to be discharged to:: Private residence Living Arrangements: Spouse/significant other Available Help at Discharge: Family;Available 24 hours/day Type of Home: House Home Access: Stairs to enter Entrance Stairs-Rails: None Entrance Stairs-Number of Steps: 2 Home Layout: One level Home Equipment: Walker - 2 wheels;Bedside commode      Prior Function Level of Independence: Independent with assistive device(s)  Comments: Used RW for ambulation      Hand Dominance   Dominant Hand: Right    Extremity/Trunk Assessment   Upper Extremity Assessment Upper  Extremity Assessment: Generalized weakness    Lower Extremity Assessment Lower Extremity Assessment: Generalized weakness    Cervical / Trunk Assessment Cervical / Trunk Assessment: Kyphotic  Communication   Communication: No difficulties  Cognition Arousal/Alertness: Awake/alert Behavior During Therapy: WFL for tasks assessed/performed Overall Cognitive Status: Within Functional Limits for tasks assessed                                        General Comments General comments (skin integrity, edema, etc.): Educated about HHPT, however, pt will likely refuse.     Exercises     Assessment/Plan    PT Assessment Patient needs continued PT services  PT Problem List Decreased strength;Decreased balance;Decreased mobility;Decreased knowledge of use of DME;Decreased knowledge of precautions;Cardiopulmonary status limiting activity       PT Treatment Interventions DME instruction;Gait training;Stair training;Functional mobility training;Therapeutic activities;Balance training;Therapeutic exercise;Neuromuscular re-education;Patient/family education    PT Goals (Current goals can be found in the Care Plan section)  Acute Rehab PT Goals Patient Stated Goal: to go home  PT Goal Formulation: With patient Time For Goal Achievement: 12/19/17 Potential to Achieve Goals: Good    Frequency Min 3X/week   Barriers to discharge        Co-evaluation               AM-PAC PT "6 Clicks" Daily Activity  Outcome Measure Difficulty turning over in bed (including adjusting bedclothes, sheets and blankets)?: A Little Difficulty moving from lying on back to sitting on the side of the bed? : A Little Difficulty sitting down on and standing up from a chair with arms (e.g., wheelchair, bedside commode, etc,.)?: Unable Help needed moving to and from a bed to chair (including a wheelchair)?: A Little Help needed walking in hospital room?: A Little Help needed climbing 3-5  steps with a railing? : A Little 6 Click Score: 16    End of Session Equipment Utilized During Treatment: Gait belt Activity Tolerance: Patient tolerated treatment well Patient left: in chair;with call bell/phone within reach Nurse Communication: Mobility status;Other (comment)(oxygen sats during ambulation ) PT Visit Diagnosis: Unsteadiness on feet (R26.81);Muscle weakness (generalized) (M62.81)    Time: 0160-1093 PT Time Calculation (min) (ACUTE ONLY): 23 min   Charges:   PT Evaluation $PT Eval Low Complexity: 1 Low PT Treatments $Gait Training: 8-22 mins   PT G Codes:        Leighton Ruff, PT, DPT  Acute Rehabilitation Services  Pager: [phone]   Rudean Hitt 12/12/2017, 3:54 PM

## 2017-12-12 NOTE — Care Management Obs Status (Signed)
[facility] NOTIFICATION   Patient Details  Name: Diane [name] MRN: [id number] Date of Birth: November 05, 1941   Medicare Observation Status Notification Given:  Yes    Carles Collet, RN 12/12/2017, 10:30 AM

## 2017-12-12 NOTE — Care Management Note (Signed)
Case Management Note  Patient Details  Name: RONIN CRAGER MRN: [id number] Date of Birth: [birth year]  Subjective/Objective:      Spoke to patient at bedside. She states sid fromhome with her spouse. Has RW and 3/1 at home. States that she fell getting back into bed misjudging the edge of the bed because she didn't turn the lights on. She declines need for [facility]. No CM needs identified.               Action/Plan:   Expected Discharge Date:                  Expected Discharge Plan:  Home/Self Care  In-House Referral:     Discharge planning Services  CM Consult  Post Acute Care Choice:    Choice offered to:     DME Arranged:    DME Agency:     HH Arranged:    HH Agency:     Status of Service:  Completed, signed off  If discussed at [employer] of Stay Meetings, dates discussed:    Additional Comments:  Carles Collet, RN 12/12/2017, 10:35 AM

## 2017-12-12 NOTE — Progress Notes (Addendum)
   Subjective: Patient continues to have cough but overall feels that she is improved. She does feel that her cough has decreased and her wheezing have decreased. She states that she is no longer lightheaded when she sits up, and has ambulated with assistance without any issues. She is not currently on any oxygen. We discussed that we are currently treating her for his COPD exacerbation, and will have physical therapy come by to evaluate her. If she does not require oxygen with ambulation she can likely be discharged home today with follow-up with her cardiologist next week. All questions and concerns addressed.  Objective: Vital signs in last 24 hours: Vitals:   12/11/17 1652 12/11/17 2138 12/11/17 2253 12/12/17 0453  BP: (!) 127/57 (!) 142/75  130/70  Pulse: 88 87  96  Resp: 17 18  18   Temp: 98.4 F (36.9 C) 98.4 F (36.9 C)  98.6 F (37 C)  TempSrc: Oral Oral  Oral  SpO2: 91% 94% 96% 93%  Weight:  246 lb 1.1 oz (111.6 kg)    Height:       General: Well-nourished female in no acute distress Pulm: Continues to have diffuse rhonchi, but wheezing is improved CV: Regular rate and rhythm, no murmurs, no rubs Abdomen: Soft, nondistended, no tenderness palpation Extremities: Mild lower extremity edema bilaterally  Assessment/Plan:  Diane [name] is a 77 year old female with a history of COPD who presented to the ED with generalized weakness and hypotension. She is endorsing signs and symptoms consistent with an acute COPD exacerbation. Today she feels improved and that her cough and SOB have decreased. Management as outlined below.  COPD exacerbation - Patient presented with worsening cough and shortness of breath - Scheduled DuoNeb's every 6 hours while awake - Prednisone 50 mg daily (day 2 of 5) - Azithromycin 250 mg daily (day 2 of 5)  Hypotension - Patient typically has hypertension but was found to be hypotensive on admission. Was felt to be secondary to over diuresis vs  Amiodarone use. She received 500 mL bolus in the ED and her blood pressure has responded well. - Normotensive, will restart home carvedilol today   Elevated TSH - TSH elevate to 5.7 likely secondary to initiation of amiodarone.  - Follow-up in 3-6 month  - No acute intervention needed.   Heart failure preserved ejection fraction - Currently holding the patient's carvedilol and torsemide - We will restart the patient's carvedilol today   Atrial fibrillation status post cardioversion - Currently in sinus rhythm - Continue amiodarone and Eliquis - Restart carvedilol  CKD stage III - Baseline GFR 18-20, today 13 - Holding torsemide - No acute indications for HD - Continue to monitor  Hypokalemia. - Replacing  Dispo: Anticipated discharge pending PT evaluation and ambulatory sat.   Ina Homes, MD 12/12/2017, 5:41 AM My Pager: [phone]

## 2017-12-12 NOTE — Discharge Instructions (Addendum)
Thank you for allowing [name] to provide your care. We have made some changes to you medications, primarily your water pill (torsemide). Please pick up your new prescriptions and take them as prescribed. The changes are listed below:  - Torsemide 20 mg once daily  - Albuterol inhaler twice daily if you are wheezing - Prednisone 50 mg in the morning for 2 days  - Azithromycin 250 mg in the morning for 2 days   Please follow-up with Dr. Wynonia Lawman next week to discuss your medication changes. Please follow-up with your primary care doctor as soon as possible.   Information on my medicine - ELIQUIS (apixaban)  This medication education was reviewed with me or my healthcare representative as part of my discharge preparation.    Why was Eliquis prescribed for you? Eliquis was prescribed for you to reduce the risk of a blood clot forming that can cause a stroke if you have a medical condition called atrial fibrillation (a type of irregular heartbeat).  What do You need to know about Eliquis ? Take your Eliquis TWICE DAILY - one tablet in the morning and one tablet in the evening with or without food. If you have difficulty swallowing the tablet whole please discuss with your pharmacist how to take the medication safely.  Take Eliquis exactly as prescribed by your doctor and DO NOT stop taking Eliquis without talking to the doctor who prescribed the medication.  Stopping may increase your risk of developing a stroke.  Refill your prescription before you run out.  After discharge, you should have regular check-up appointments with your healthcare provider that is prescribing your Eliquis.  In the future your dose may need to be changed if your kidney function or weight changes by a significant amount or as you get older.  What do you do if you miss a dose? If you miss a dose, take it as soon as you remember on the same day and resume taking twice daily.  Do not take more than one dose of ELIQUIS at  the same time to make up a missed dose.  Important Safety Information A possible side effect of Eliquis is bleeding. You should call your healthcare provider right away if you experience any of the following: ? Bleeding from an injury or your nose that does not stop. ? Unusual colored urine (red or dark brown) or unusual colored stools (red or black). ? Unusual bruising for unknown reasons. ? A serious fall or if you hit your head (even if there is no bleeding).  Some medicines may interact with Eliquis and might increase your risk of bleeding or clotting while on Eliquis. To help avoid this, consult your healthcare provider or pharmacist prior to using any new prescription or non-prescription medications, including herbals, vitamins, non-steroidal anti-inflammatory drugs (NSAIDs) and supplements.  This website has more information on Eliquis (apixaban): http://www.eliquis.com/eliquis/home

## 2017-12-12 NOTE — Progress Notes (Signed)
Internal Medicine Attending:   I saw and examined the patient. I reviewed the resident's note and I agree with the resident's findings and plan as documented in the resident's note.  77 year old woman admitted with hypotension which is now improved with hydration. And dyspnea likely due to COPD exacerbation. It is improving some with steroids, but still diffuse wheezing this morning. Will need PT eval and walking O2 saturations. Continue with inhaled bronchodilators, and please get echo to assess EF.

## 2017-12-12 NOTE — Progress Notes (Signed)
Patient refusing duoneb at this time. Stated she would call if needed.

## 2017-12-13 DIAGNOSIS — I5032 Chronic diastolic (congestive) heart failure: Secondary | ICD-10-CM

## 2017-12-13 DIAGNOSIS — I4891 Unspecified atrial fibrillation: Secondary | ICD-10-CM | POA: Diagnosis not present

## 2017-12-13 DIAGNOSIS — J441 Chronic obstructive pulmonary disease with (acute) exacerbation: Secondary | ICD-10-CM | POA: Diagnosis not present

## 2017-12-13 DIAGNOSIS — I959 Hypotension, unspecified: Secondary | ICD-10-CM

## 2017-12-13 LAB — BASIC METABOLIC PANEL
Anion gap: 13 (ref 5–15)
BUN: 47 mg/dL — AB (ref 6–20)
CO2: 29 mmol/L (ref 22–32)
CREATININE: 2.28 mg/dL — AB (ref 0.44–1.00)
Calcium: 9.4 mg/dL (ref 8.9–10.3)
Chloride: 98 mmol/L — ABNORMAL LOW (ref 101–111)
GFR calc non Af Amer: 20 mL/min — ABNORMAL LOW (ref >60)
GFR, EST AFRICAN AMERICAN: 23 mL/min — AB (ref >60)
Glucose, Bld: 112 mg/dL — ABNORMAL HIGH (ref 65–99)
Potassium: 3.8 mmol/L (ref 3.5–5.1)
SODIUM: 140 mmol/L (ref 135–145)

## 2017-12-13 MED ORDER — AZITHROMYCIN 250 MG PO TABS: 250.0000 mg | ORAL_TABLET | Freq: Every day | ORAL | 0 refills | Status: AC

## 2017-12-13 MED ORDER — PREDNISONE 50 MG PO TABS: 50.0000 mg | ORAL_TABLET | Freq: Every day | ORAL | 0 refills | Status: AC

## 2017-12-13 MED ORDER — ALBUTEROL SULFATE HFA 108 (90 BASE) MCG/ACT IN AERS: 1.0000 | INHALATION_SPRAY | Freq: Four times a day (QID) | RESPIRATORY_TRACT | 0 refills | Status: DC | PRN

## 2017-12-13 MED ORDER — TORSEMIDE 20 MG PO TABS: 20.0000 mg | ORAL_TABLET | Freq: Every day | ORAL | 0 refills | Status: DC

## 2017-12-13 NOTE — Progress Notes (Signed)
   Subjective: Patient feels that her breathing has improved and her wheezing has resolved. She was not able to get much sleep overnight due to her cough. She is requesting to go home. She is wondering about how her medications will change on discharge. We discussed the results of her ambulatory pulse ox and that we will repeat it today to determine if she needs oxygen at home. We will await her echocardiogram results before making any adjustments to her diuretic therapy. She voices understanding. All questions and concerns addressed.   Objective: Vital signs in last 24 hours: Vitals:   12/12/17 1653 12/12/17 2144 12/13/17 0114 12/13/17 0531  BP: 138/72 (!) 151/85  (!) 156/83  Pulse: 87 87  88  Resp: 18 18  20   Temp: 98.6 F (37 C) 98.5 F (36.9 C)  97.6 F (36.4 C)  TempSrc: Oral Oral  Oral  SpO2: 95% 94%  90%  Weight:   242 lb 6.4 oz (110 kg)   Height:  5\' 5"  (1.651 m)     General: Obese female in no acute distress Pulm: Good air movement, rhonchi greatly improved but still has diffuse wheezing CV: RRR, no murmurs, no rubs  Abdomen: Active bowel sounds, soft, non-distended Extremities: Trace-mild pitting edema bilaterally   Assessment/Plan:  Diane [name] is a 77 year old female with a history of COPD who presented to the ED with generalized weakness and hypotension. She is endorsing signs and symptoms consistent with an acute COPD exacerbation. She continues to feel like her breathing and cough are improving.  COPD exacerbation - Patient presented with worsening cough and shortness of breath - Scheduled DuoNeb's every 6 hours while awake - Prednisone 50 mg daily (day 3 of 5) - Azithromycin 250 mg daily (day 3 of 5) - Ambulatory pulse ox did not drop below 88% today  - PT recommending home health PT  Hypotension - Patient typically has hypertension but was found to be hypotensive on admission. Was felt to be secondary to over diuresis vs Amiodarone use. She received 500 mL  bolus in the ED and her blood pressure has responded well. - Patient continues to be normotensive with blood pressure trending more towards the hypertensive state. She was restarted on her carvedilol yesterday, and we will restart her torsemide today.  Elevated TSH - TSH elevate to 5.7 likely secondary to initiation of amiodarone.  - Follow-up in 3-6 month  - No acute intervention needed.   Heart failure preserved ejection fraction - Echocardiogram performed yesterday showing EF of 65-70%, unable to assess diastolic dysfunction, left atrial enlargement, normal right-sided heart function, and no significant valvular abnormalities. - Restarted patient's carvedilol yesterday, plan to restart torsemide today.  Atrial fibrillation status post cardioversion - Currently in sinus rhythm -Continue amiodarone, carvedilol, and Eliquis  CKD stage III -Baseline GFR18-20 -No acute indications for HD -Continue to monitor  Hypokalemia. -Replacing  Dispo: Anticipated discharge in approximately 0 day(s).   Diane Homes, MD 12/13/2017, 6:09 AM My Pager: [phone]

## 2017-12-13 NOTE — Discharge Summary (Signed)
Name: Diane [name] MRN: [id number] DOB: 01/08/1941 77 y.o. PCP: Diane Downing, MD  Date of Admission: 12/11/2017 10:46 AM Date of Discharge:  Attending Physician: Diane [name], *  Discharge Diagnosis: 1. COPD Exacerbation 2. Hypotension  3. HFpEF  Principal Problem:   COPD exacerbation ([place]) Active Problems:   Chronic diastolic heart failure (HCC)   Hypotension  Discharge Medications: Allergies as of 12/13/2017      Reactions   Septra Ds [sulfamethoxazole-trimethoprim] Nausea Only      Medication List    TAKE these medications   albuterol 108 (90 Base) MCG/ACT inhaler Commonly known as:  PROVENTIL HFA;VENTOLIN HFA Inhale 1-2 puffs into the lungs every 6 (six) hours as needed for wheezing or shortness of breath.   amiodarone 200 MG tablet Commonly known as:  PACERONE Take 1 tablet (200 mg total) by mouth 2 (two) times daily.   azithromycin 250 MG tablet Commonly known as:  ZITHROMAX Take 1 tablet (250 mg total) by mouth daily for 2 days. Start taking on:  12/14/2017   calcitRIOL 0.25 MCG capsule Commonly known as:  ROCALTROL Take 0.25 mcg by mouth every Monday, Wednesday, and Friday.   carvedilol 25 MG tablet Commonly known as:  COREG Take 25 mg by mouth 2 (two) times daily.   ELIQUIS 5 MG Tabs tablet Generic drug:  apixaban Take 5 mg by mouth 2 (two) times daily.   hydroxypropyl methylcellulose / hypromellose 2.5 % ophthalmic solution Commonly known as:  ISOPTO TEARS / GONIOVISC Place 1 drop into both eyes as needed for dry eyes.   polyethylene glycol packet Commonly known as:  MIRALAX / GLYCOLAX Take 17 g by mouth daily as needed for mild constipation. Reported on 06/05/2016   predniSONE 50 MG tablet Commonly known as:  DELTASONE Take 1 tablet (50 mg total) by mouth daily with breakfast for 2 days.   torsemide 20 MG tablet Commonly known as:  DEMADEX Take 1 tablet (20 mg total) by mouth daily. What changed:    how much to  take  when to take this     Disposition and follow-up:   [name] was discharged from [facility] in Good condition.  At the hospital follow up visit please address:  1.  COPD.  Patient has PFTs that were performed in 2018, they are indicative of a mixed obstructive/restrictive lung disease. She was discharged with an albuterol inhaler. Please discuss starting additional inhalers.  Heart failure. Please address her diuretic use and determine whether her torsemide needs to be increased.  2.  Labs / imaging needed at time of follow-up: BMP to assess renal function  3.  Pending labs/ test needing follow-up: None  Follow-up Appointments: Follow-up Information    Diane Downing, MD Follow up.   Specialty:  Family Medicine Contact information: [place] [address] [phone]        Diane Reedy, MD Follow up.   Specialty:  Cardiology Contact information: [place] [id number] [place] Hospital Course by problem list: Principal Problem:   COPD exacerbation [facility]) Active Problems:   Chronic diastolic heart failure (HCC)   Hypotension   COPD Exacerbation. LindaMonnetteis a 77 year old female with a history of COPD who presented to the ED with generalized weakness and hypotension. She is endorsing signs and symptoms consistent with an acute COPD exacerbation. She was treated acutely with scheduled breathing treatments, prednisone,  and azithromycin. Her symptoms significantly improved. Physical therapy worked with the patient and noted that her oxygen saturations dropped to approximately 87% with intense ambulation. However on repeat ambulatory pulse ox, patient's oxygen saturation stayed greater than 90%. The patient was discharged with the remaining days of her prednisone and azithromycin, along with an albuterol inhaler for as needed use. She was discharged in stable condition  with instructions to follow-up with her primary care provider as soon as possible.  Hypotension. Patient initially presented to the ED with hypotension.  This was felt to be secondary to either overdiuresis or the initiation of amiodarone therapy. She received a 500 mL bolus and her blood pressure subsequently improved. Her blood pressure medications including, carvedilol and torsemide, were slowly reintroduced over the course of the hospitalization with no further hypotension. Patient will follow up with cardiology next week.  HFpEF. Patient has a known history of heart failure with preserved ejection fraction and A. fib status post cardioversion earlier this year.  She presented with hypotension that was felt to be secondary to either overdiuresis or initiation of amiodarone therapy. Echocardiogram was obtained while she was hospitalized. Results can be seen below. Her carvedilol and torsemide were restarted. However we did decrease her torsemide to 20 mg daily. She will follow-up with cardiology next week.  Discharge Vitals:   BP (!) 142/52   Pulse 91   Temp 97.6 F (36.4 C) (Oral)   Resp 20   Ht 5\' 5"  (1.651 m)   Wt 242 lb 6.4 oz (110 kg)   SpO2 90%   BMI 40.34 kg/m   Pertinent Labs, Studies, and Procedures:   Echocardiogram  - Left ventricle: The cavity size was normal. There was mild   concentric hypertrophy. Systolic function was vigorous. The   estimated ejection fraction was in the range of 65% to 70%. Wall   motion was normal; there were no regional wall motion   abnormalities. The study is not technically sufficient to allow   evaluation of LV diastolic function. - Aortic valve: There was no regurgitation. - Mitral valve: There was trivial regurgitation. Valve area by pressure half-time: 1.63 cm^2. - Left atrium: The atrium was moderately dilated. - Right ventricle: Systolic function was normal. - Right atrium: The atrium was normal in size. - Tricuspid valve: There was  trivial regurgitation. - Pulmonary arteries: Systolic pressure was within the normal range. - Inferior vena cava: The vessel was normal in size. - Pericardium, extracardiac: There was no pericardial effusion.  Discharge Instructions: Discharge Instructions    (HEART FAILURE PATIENTS) Call MD:  Anytime you have any of the following symptoms: 1) 3 pound weight gain in 24 hours or 5 pounds in 1 week 2) shortness of breath, with or without a dry hacking cough 3) swelling in the hands, feet or stomach 4) if you have to sleep on extra pillows at night in order to breathe.   Complete by:  As directed    Diet - low sodium heart healthy   Complete by:  As directed    Face-to-face encounter (required for Medicare/Medicaid patients)   Complete by:  As directed    I Ina Homes certify that this patient is under my care and that I, or a nurse practitioner or physician's assistant working with me, had a face-to-face encounter that meets the physician face-to-face encounter requirements with this patient on 12/13/2017. The encounter with the patient was in whole, or in part for the following medical condition(s) which is the primary  reason for home health care (List medical condition): Weakness, COPD   The encounter with the patient was in whole, or in part, for the following medical condition, which is the primary reason for home health care:  COP exacerbation, Weakness   I certify that, based on my findings, the following services are medically necessary home health services:  Physical therapy   Reason for Medically Necessary Home Health Services:  Therapy- Personnel officer, Public librarian   My clinical findings support the need for the above services:  Unable to leave home safely without assistance and/or assistive device   Further, I certify that my clinical findings support that this patient is homebound due to:  Unsafe ambulation due to balance issues   Home Health   Complete by:  As  directed    To provide the following care/treatments:  PT   Increase activity slowly   Complete by:  As directed      Signed: Ina Homes, MD 12/13/2017, 11:02 AM   My Pager: [phone]

## 2017-12-13 NOTE — Progress Notes (Signed)
Patient discharged to home, AVS reviewed, tele box returned, IV removed. Patient left floor via wheelchair with staff member

## 2017-12-13 NOTE — Progress Notes (Signed)
SATURATION QUALIFICATIONS: (This note is used to comply with regulatory documentation for home oxygen)  Patient Saturations on Room Air at Rest = 95%  Patient Saturations on Room Air while Ambulating = 93%  Patient Saturations on n/a Liters of oxygen while Ambulating = n/a%  Please briefly explain why patient needs home oxygen: does not require home 02

## 2017-12-13 NOTE — Care Management CC44 (Signed)
Condition Code 44 Documentation Completed  Patient Details  Name: Diane [name] MRN: [id number] Date of Birth: 1941/01/31   Condition Code 44 given:    Patient signature on Condition Code 44 notice:    Documentation of 2 MD's agreement:    Code 44 added to claim:       Ninfa Meeker, RN 12/13/2017, 12:05 PM

## 2017-12-13 NOTE — Care Management CC44 (Signed)
Condition Code 44 Documentation Completed  Patient Details  Name: Diane [name] MRN: [id number] Date of Birth: 11/26/1941   Condition Code 44 given:  Yes Patient signature on Condition Code 44 notice:  Yes Documentation of 2 MD's agreement:  Yes Code 44 added to claim:  Yes    Ninfa Meeker, RN 12/13/2017, 12:06 PM

## 2017-12-13 NOTE — Progress Notes (Signed)
Refuses bed alarm. Steady gate with walker.

## 2018-01-17 ENCOUNTER — Ambulatory Visit: Payer: Medicare Other | Admitting: Adult Health

## 2018-01-17 ENCOUNTER — Encounter: Payer: Self-pay | Admitting: Adult Health

## 2018-01-17 DIAGNOSIS — J9611 Chronic respiratory failure with hypoxia: Secondary | ICD-10-CM | POA: Diagnosis not present

## 2018-01-17 DIAGNOSIS — I5032 Chronic diastolic (congestive) heart failure: Secondary | ICD-10-CM | POA: Diagnosis not present

## 2018-01-17 DIAGNOSIS — C3491 Malignant neoplasm of unspecified part of right bronchus or lung: Secondary | ICD-10-CM | POA: Diagnosis not present

## 2018-01-17 DIAGNOSIS — J441 Chronic obstructive pulmonary disease with (acute) exacerbation: Secondary | ICD-10-CM

## 2018-01-17 MED ORDER — UMECLIDINIUM-VILANTEROL 62.5-25 MCG/INH IN AEPB: 1.0000 | INHALATION_SPRAY | Freq: Every day | RESPIRATORY_TRACT | 0 refills | Status: DC

## 2018-01-17 MED ORDER — UMECLIDINIUM-VILANTEROL 62.5-25 MCG/INH IN AEPB: 1.0000 | INHALATION_SPRAY | Freq: Every day | RESPIRATORY_TRACT | 5 refills | Status: DC

## 2018-01-17 NOTE — Assessment & Plan Note (Signed)
Follow up with oncology next week with CT chest .

## 2018-01-17 NOTE — Progress Notes (Signed)
@Patient  ID: Diane [name], female    DOB: [birth year], 77 y.o.   MRN: [id number]  Chief Complaint  Patient presents with  . Follow-up    COPD     Referring provider: [name], *  HPI: 77 year old female smoker followed for COPD, non-small cell lung cancer diagnosed March 2008 status post concurrent chemoradiation, left upper lobe nodules  TEST  PFT 05/23/17: FVC 1.66 L (58%) FEV1 1.17 L (54%) FEV1/FVC 0.70 FEF 25-75 0.69 L (42%) negative bronchodilator response TLC 4.00 L (76%) RV 103% ERV 35% DLCO corrected 42%  6MWT 05/23/17:  Walked 235 meters / Baseline Sat 100% on RA / Nadir Sat 88% on RA @ 2:40 during stop (stopped multiple times during walk due to fatigue and leg pain)  IMAGING CT CHEST W/O 03/26/17:  Partially loculated right pleural effusion unchanged. Fibrotic changes within right lung relatively unchanged. No pathologic mediastinal adenopathy appreciated. Some improvement in left upper lobe groundglass nodules. No new or developing nodule appreciated. No pericardial effusion.  PET CT 12/25/16 (per radiologist):  IMPRESSION: 1. Mild degradation secondary to patient body habitus. 2. Central right lower lobe masslike consolidation demonstrates moderate, relatively diffuse hypermetabolism. This pattern favors evolving radiation change. Residual/recurrent disease cannot be excluded. Potential clinical strategies include CT followup at 3 months or if there is a strong clinical concern of recurrent disease, bronchoscopy with attention to the right lower lobe. 3. No evidence of extrathoracic disease. 4. Chronic right-sided pleural effusion. 5. Coronary artery atherosclerosis. Aortic atherosclerosis. 6. Possible constipation.  CT CHEST W/O 12/04/16  No pathologic mediastinal adenopathy. Retraction of the mediastinum to the right with consolidation with air bronchograms consistent with previous right-sided radiation treatment. Some calcification within right lower  lobe as well. Questionable increase in density of right lower lobe masslike opacification as well. Subcentimeter groundglass nodules within the left upper lobe. No appreciated pleural effusion. No pericardial effusion.  01/17/2018 Follow up : COPD , [facility] follow up . Lung Cancer , Lung nodules  Pt presents for follow up . Says she is becoming more sob with activity and At bedtime  .  Has been admitted x 2 in last 2 months, for COPD and A Fib .  Followed by Cardiology , Started on Amiodarone and Eliquis started recently for A Fib . She has D CHF . She has CKD , followed by nephrology . Difficult to diuresis due to CKD .  Says she has to sleep in recliner for last couple of years due to orthopnea.   She has known lung cancer . Currently under survelliance  CT chest 09/2017 no sign change. Has CT chest planned next week.   She is unsure if she snores Does have some daytime sleepiness. She declines sleep study .  Feels her oxygen drops at night as she is short of breath a lot at night .   She has mixed COPD w/ restrictive Lung disease and minimal airflow obstruction .  She uses Proair As needed   Walk test in office with O2 sat 88%, placed on O2 with O2 sats >90%.      Allergies  Allergen Reactions  . Septra Ds [Sulfamethoxazole-Trimethoprim] Nausea Only    Immunization History  Administered Date(s) Administered  . HPV Quadrivalent 09/14/2017  . Influenza-Unspecified 08/10/2014, 08/30/2016  . Zoster Recombinat (Shingrix) 09/14/2017    Past Medical History:  Diagnosis Date  . Aortic atherosclerosis ([place]) 12/03/2017  . CAD (coronary artery disease), native coronary artery 12/03/2017   Noted on  CT scan by calcification   . CHF (congestive heart failure) ([place])   . Chronic diastolic heart failure ([place]) 12/03/2017  . GERD (gastroesophageal reflux disease)   . Heart murmur   . History of blood transfusion    "related to chemo" (12/05/2017)  . Hypertension   . Hypertensive heart  disease 12/03/2017  . Kidney disease, chronic, stage III (GFR 30-59 ml/min) ([place]) 12/03/2017  . Lung cancer ([place]) dx'd 02/2010  . Mitral valve prolapse   . Morbid obesity ([place]) 12/03/2017  . 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 Persistent atrial fibrillation ([place]) 12/03/2017  . Restrictive lung disease 05/23/2017    Tobacco History: Social History   Tobacco Use  Smoking Status Former Smoker  . Packs/day: 2.00  . Years: 40.00  . Pack years: 80.00  . Types: Cigarettes  . Last attempt to quit: 11/27/2001  . Years since quitting: 16.1  Smokeless Tobacco Never Used   Counseling given: Not Answered   Outpatient Encounter Medications as of 01/17/2018  Medication Sig  . albuterol (PROVENTIL HFA;VENTOLIN HFA) 108 (90 Base) MCG/ACT inhaler Inhale 1-2 puffs into the lungs every 6 (six) hours as needed for wheezing or shortness of breath.  Marland Kitchen amiodarone (PACERONE) 200 MG tablet Take 1 tablet (200 mg total) by mouth 2 (two) times daily.  Marland Kitchen apixaban (ELIQUIS) 5 MG TABS tablet Take 5 mg by mouth 2 (two) times daily.  . calcitRIOL (ROCALTROL) 0.25 MCG capsule Take 0.25 mcg by mouth every Monday, Wednesday, and Friday.  . carvedilol (COREG) 25 MG tablet Take 25 mg by mouth 2 (two) times daily.  . hydrALAZINE (APRESOLINE) 25 MG tablet Take 25 mg by mouth 2 (two) times daily.  . hydroxypropyl methylcellulose / hypromellose (ISOPTO TEARS / GONIOVISC) 2.5 % ophthalmic solution Place 1 drop into both eyes as needed for dry eyes.  . polyethylene glycol (MIRALAX / GLYCOLAX) packet Take 17 g by mouth daily as needed for mild constipation. Reported on 06/05/2016  . torsemide (DEMADEX) 20 MG tablet Take 1 tablet (20 mg total) by mouth daily.   Facility-Administered Encounter Medications as of 01/17/2018  Medication  . sodium chloride 0.9 % injection 10 mL     Review of Systems  Constitutional:   No  weight loss, night sweats,  Fevers, chills,  +fatigue, or  lassitude.  HEENT:   No headaches,   Difficulty swallowing,  Tooth/dental problems, or  Sore throat,                No sneezing, itching, ear ache, nasal congestion, post nasal drip,   CV:  No chest pain,  Orthopnea, PND,  +swelling in lower extremities,  No anasarca, dizziness, palpitations, syncope.   GI  No heartburn, indigestion, abdominal pain, nausea, vomiting, diarrhea, change in bowel habits, loss of appetite, bloody stools.   Resp:  .  No chest wall deformity  Skin: no rash or lesions.  GU: no dysuria, change in color of urine, no urgency or frequency.  No flank pain, no hematuria   MS:  No joint pain or swelling.  No decreased range of motion.  No back pain.    Physical Exam  BP 130/74 (BP Location: Right Arm, Cuff Size: Normal)   Pulse 90   Ht 5\' 5"  (1.651 m)   Wt 256 lb 6.4 oz (116.3 kg)   SpO2 95%   BMI 42.67 kg/m   GEN: A/Ox3; pleasant , NAD, obese    HEENT:  [place]/AT,  EACs-clear, TMs-wnl, NOSE-clear, THROAT-clear, no  lesions, no postnasal drip or exudate noted.   NECK:  Supple w/ fair ROM; no JVD; normal carotid impulses w/o bruits; no thyromegaly or nodules palpated; no lymphadenopathy.    RESP  Few trace rhonchi , no accessory muscle use, no dullness to percussion  CARD:  RRR, no m/r/g, 1+ peripheral edema, pulses intact, no cyanosis or clubbing.  GI:   Soft & nt; nml bowel sounds; no organomegaly or masses detected.   Musco: Warm bil, no deformities or joint swelling noted.   Neuro: alert, no focal deficits noted.    Skin: Warm, no lesions or rashes    Lab Results:  CBC    Component Value Date/Time   WBC 4.0 12/12/2017 0920   RBC 3.82 (L) 12/12/2017 0920   HGB 11.9 (L) 12/12/2017 0920   HGB 12.5 10/01/2017 1116   HCT 35.7 (L) 12/12/2017 0920   HCT 36.9 10/01/2017 1116   PLT 152 12/12/2017 0920   PLT 200 10/01/2017 1116   MCV 93.5 12/12/2017 0920   MCV 95.0 10/01/2017 1116   MCH 31.2 12/12/2017 0920   MCHC 33.3 12/12/2017 0920   RDW 13.5 12/12/2017 0920   RDW 13.3  10/01/2017 1116   LYMPHSABS 0.9 12/03/2017 2104   LYMPHSABS 0.6 (L) 10/01/2017 1116   MONOABS 0.5 12/03/2017 2104   MONOABS 0.6 10/01/2017 1116   EOSABS 0.1 12/03/2017 2104   EOSABS 0.2 10/01/2017 1116   BASOSABS 0.0 12/03/2017 2104   BASOSABS 0.0 10/01/2017 1116    BMET    Component Value Date/Time   NA 140 12/13/2017 0735   NA 140 10/01/2017 1116   K 3.8 12/13/2017 0735   K 4.2 10/01/2017 1116   CL 98 (L) 12/13/2017 0735   CL 102 05/08/2013 0851   CO2 29 12/13/2017 0735   CO2 29 10/01/2017 1116   GLUCOSE 112 (H) 12/13/2017 0735   GLUCOSE 90 10/01/2017 1116   GLUCOSE 91 05/08/2013 0851   BUN 47 (H) 12/13/2017 0735   BUN 31.7 (H) 10/01/2017 1116   CREATININE 2.28 (H) 12/13/2017 0735   CREATININE 1.8 (H) 10/01/2017 1116   CALCIUM 9.4 12/13/2017 0735   CALCIUM 9.5 10/01/2017 1116   GFRNONAA 20 (L) 12/13/2017 0735   GFRAA 23 (L) 12/13/2017 0735    BNP    Component Value Date/Time   BNP 206.4 (H) 12/11/2017 1145    ProBNP No results found for: PROBNP  Imaging: No results found.   Assessment & Plan:   Chronic diastolic heart failure ([place]) Appears compensated without acute decompensation  Cont on current regimen  Tough situation with CKD  Check ONO At bedtime  .  (declnies sleep study)   COPD exacerbation ([place]) Recent COPD flare now resolving  Trial of ANORO   Plan . Patient Instructions  Check overnight oximetry .  Begin ANORO 1 puff daily . Rinse after use.  Follow up with Oncology /CT chest next week as planned  Follow up with Dr. Lamonte Sakai in 6 weeks and As needed   Please contact office for sooner follow up if symptoms do not improve or worsen or seek emergency care       Cancer of right lung parenchyma ([place]) Follow up with oncology next week with CT chest .   Chronic respiratory failure with hypoxia ([place]) Begin O2 2l/m with activity  Check ONO on room air .       Rexene Edison, NP 01/17/2018

## 2018-01-17 NOTE — Assessment & Plan Note (Signed)
Begin O2 2l/m with activity  Check ONO on room air .

## 2018-01-17 NOTE — Assessment & Plan Note (Signed)
Recent COPD flare now resolving  Trial of ANORO   Plan . Patient Instructions  Check overnight oximetry .  Begin ANORO 1 puff daily . Rinse after use.  Follow up with Oncology /CT chest next week as planned  Follow up with Dr. Lamonte Sakai in 6 weeks and As needed   Please contact office for sooner follow up if symptoms do not improve or worsen or seek emergency care

## 2018-01-17 NOTE — Addendum Note (Signed)
Addended by: Parke Poisson E on: 01/17/2018 12:53 PM   Modules accepted: Orders

## 2018-01-17 NOTE — Assessment & Plan Note (Signed)
Appears compensated without acute decompensation  Cont on current regimen  Tough situation with CKD  Check ONO At bedtime  .  (declnies sleep study)

## 2018-01-17 NOTE — Progress Notes (Signed)
Patient seen in the office today and instructed on use of Anoro.  Patient expressed understanding and demonstrated technique. Parke Poisson, [facility] 01/17/18

## 2018-01-17 NOTE — Patient Instructions (Addendum)
Check overnight oximetry on room air .  Begin Oxygen 2ll/m with activity .  Begin ANORO 1 puff daily . Rinse after use.  Follow up with Oncology /CT chest next week as planned  Follow up with Dr. Lamonte Sakai in 6 weeks and As needed   Please contact office for sooner follow up if symptoms do not improve or worsen or seek emergency care

## 2018-01-21 ENCOUNTER — Inpatient Hospital Stay: Payer: Medicare Other | Attending: Internal Medicine

## 2018-01-21 ENCOUNTER — Inpatient Hospital Stay: Payer: Medicare Other

## 2018-01-21 ENCOUNTER — Ambulatory Visit (HOSPITAL_COMMUNITY)
Admission: RE | Admit: 2018-01-21 | Discharge: 2018-01-21 | Disposition: A | Discharge status: Home / Self Care | Payer: Medicare Other | Source: Ambulatory Visit | Attending: Internal Medicine | Admitting: Internal Medicine

## 2018-01-21 DIAGNOSIS — I130 Hypertensive heart and chronic kidney disease with heart failure and stage 1 through stage 4 chronic kidney disease, or unspecified chronic kidney disease: Secondary | ICD-10-CM | POA: Diagnosis not present

## 2018-01-21 DIAGNOSIS — Z882 Allergy status to sulfonamides status: Secondary | ICD-10-CM | POA: Insufficient documentation

## 2018-01-21 DIAGNOSIS — R0609 Other forms of dyspnea: Secondary | ICD-10-CM | POA: Insufficient documentation

## 2018-01-21 DIAGNOSIS — I5032 Chronic diastolic (congestive) heart failure: Secondary | ICD-10-CM | POA: Diagnosis not present

## 2018-01-21 DIAGNOSIS — Z79899 Other long term (current) drug therapy: Secondary | ICD-10-CM | POA: Diagnosis not present

## 2018-01-21 DIAGNOSIS — C3490 Malignant neoplasm of unspecified part of unspecified bronchus or lung: Secondary | ICD-10-CM | POA: Diagnosis present

## 2018-01-21 DIAGNOSIS — I700 Atherosclerosis of aorta: Secondary | ICD-10-CM | POA: Insufficient documentation

## 2018-01-21 DIAGNOSIS — K219 Gastro-esophageal reflux disease without esophagitis: Secondary | ICD-10-CM | POA: Diagnosis not present

## 2018-01-21 DIAGNOSIS — R5383 Other fatigue: Secondary | ICD-10-CM | POA: Insufficient documentation

## 2018-01-21 DIAGNOSIS — I481 Persistent atrial fibrillation: Secondary | ICD-10-CM | POA: Diagnosis not present

## 2018-01-21 DIAGNOSIS — I2584 Coronary atherosclerosis due to calcified coronary lesion: Secondary | ICD-10-CM | POA: Diagnosis not present

## 2018-01-21 DIAGNOSIS — C3491 Malignant neoplasm of unspecified part of right bronchus or lung: Secondary | ICD-10-CM | POA: Diagnosis not present

## 2018-01-21 DIAGNOSIS — I2510 Atherosclerotic heart disease of native coronary artery without angina pectoris: Secondary | ICD-10-CM | POA: Insufficient documentation

## 2018-01-21 DIAGNOSIS — Z923 Personal history of irradiation: Secondary | ICD-10-CM | POA: Insufficient documentation

## 2018-01-21 DIAGNOSIS — Z7901 Long term (current) use of anticoagulants: Secondary | ICD-10-CM | POA: Insufficient documentation

## 2018-01-21 DIAGNOSIS — J90 Pleural effusion, not elsewhere classified: Secondary | ICD-10-CM | POA: Diagnosis not present

## 2018-01-21 DIAGNOSIS — J439 Emphysema, unspecified: Secondary | ICD-10-CM | POA: Insufficient documentation

## 2018-01-21 DIAGNOSIS — Z95828 Presence of other vascular implants and grafts: Secondary | ICD-10-CM

## 2018-01-21 DIAGNOSIS — Z9221 Personal history of antineoplastic chemotherapy: Secondary | ICD-10-CM | POA: Diagnosis not present

## 2018-01-21 LAB — CBC WITH DIFFERENTIAL/PLATELET
BASOS PCT: 1 %
Basophils Absolute: 0.1 10*3/uL (ref 0.0–0.1)
EOS ABS: 0.1 10*3/uL (ref 0.0–0.5)
EOS PCT: 3 %
HEMATOCRIT: 30.4 % — AB (ref 34.8–46.6)
Hemoglobin: 10 g/dL — ABNORMAL LOW (ref 11.6–15.9)
LYMPHS ABS: 0.4 10*3/uL — AB (ref 0.9–3.3)
Lymphocytes Relative: 9 %
MCH: 31.1 pg (ref 25.1–34.0)
MCHC: 33 g/dL (ref 31.5–36.0)
MCV: 94.3 fL (ref 79.5–101.0)
MONO ABS: 0.6 10*3/uL (ref 0.1–0.9)
MONOS PCT: 12 %
NEUTROS PCT: 75 %
Neutro Abs: 3.5 10*3/uL (ref 1.5–6.5)
Platelets: 202 10*3/uL (ref 145–400)
RBC: 3.23 MIL/uL — ABNORMAL LOW (ref 3.70–5.45)
RDW: 16.1 % — ABNORMAL HIGH (ref 11.2–14.5)
WBC: 4.7 10*3/uL (ref 3.9–10.3)

## 2018-01-21 LAB — COMPREHENSIVE METABOLIC PANEL
ALBUMIN: 2.8 g/dL — AB (ref 3.5–5.0)
ALT: 6 U/L (ref 0–55)
AST: 10 U/L (ref 5–34)
Alkaline Phosphatase: 58 U/L (ref 40–150)
Anion gap: 7 (ref 3–11)
BUN: 21 mg/dL (ref 7–26)
CHLORIDE: 101 mmol/L (ref 98–109)
CO2: 28 mmol/L (ref 22–29)
CREATININE: 1.68 mg/dL — AB (ref 0.60–1.10)
Calcium: 9.2 mg/dL (ref 8.4–10.4)
GFR calc Af Amer: 33 mL/min — ABNORMAL LOW (ref >60)
GFR calc non Af Amer: 28 mL/min — ABNORMAL LOW (ref >60)
GLUCOSE: 120 mg/dL (ref 70–140)
Potassium: 4.3 mmol/L (ref 3.5–5.1)
SODIUM: 136 mmol/L (ref 136–145)
Total Bilirubin: 1 mg/dL (ref 0.2–1.2)
Total Protein: 6.2 g/dL — ABNORMAL LOW (ref 6.4–8.3)

## 2018-01-21 MED ORDER — SODIUM CHLORIDE 0.9 % IJ SOLN
10.0000 mL | INTRAMUSCULAR | Status: DC | PRN
  Administered 2018-01-21: 10 mL via INTRAVENOUS
  Filled 2018-01-21: qty 10

## 2018-01-21 MED ORDER — HEPARIN SOD (PORK) LOCK FLUSH 100 UNIT/ML IV SOLN
500.0000 [IU] | Freq: Once | INTRAVENOUS | Status: AC | PRN
  Administered 2018-01-21: 500 [IU] via INTRAVENOUS
  Filled 2018-01-21: qty 5

## 2018-01-23 ENCOUNTER — Inpatient Hospital Stay: Payer: Medicare Other | Admitting: Internal Medicine

## 2018-01-23 ENCOUNTER — Encounter: Payer: Self-pay | Admitting: *Deleted

## 2018-01-23 ENCOUNTER — Telehealth: Payer: Self-pay

## 2018-01-23 ENCOUNTER — Encounter: Payer: Self-pay | Admitting: Internal Medicine

## 2018-01-23 VITALS — BP 139/48 | HR 92 | Temp 97.0°F | Resp 22 | Wt 259.1 lb

## 2018-01-23 DIAGNOSIS — C3491 Malignant neoplasm of unspecified part of right bronchus or lung: Secondary | ICD-10-CM | POA: Diagnosis not present

## 2018-01-23 DIAGNOSIS — I4891 Unspecified atrial fibrillation: Secondary | ICD-10-CM | POA: Diagnosis not present

## 2018-01-23 DIAGNOSIS — I5032 Chronic diastolic (congestive) heart failure: Secondary | ICD-10-CM

## 2018-01-23 DIAGNOSIS — R5383 Other fatigue: Secondary | ICD-10-CM

## 2018-01-23 DIAGNOSIS — Z7901 Long term (current) use of anticoagulants: Secondary | ICD-10-CM | POA: Diagnosis not present

## 2018-01-23 DIAGNOSIS — Z923 Personal history of irradiation: Secondary | ICD-10-CM | POA: Diagnosis not present

## 2018-01-23 DIAGNOSIS — Z9221 Personal history of antineoplastic chemotherapy: Secondary | ICD-10-CM | POA: Diagnosis not present

## 2018-01-23 DIAGNOSIS — Z79899 Other long term (current) drug therapy: Secondary | ICD-10-CM | POA: Diagnosis not present

## 2018-01-23 DIAGNOSIS — I130 Hypertensive heart and chronic kidney disease with heart failure and stage 1 through stage 4 chronic kidney disease, or unspecified chronic kidney disease: Secondary | ICD-10-CM

## 2018-01-23 DIAGNOSIS — I5033 Acute on chronic diastolic (congestive) heart failure: Secondary | ICD-10-CM

## 2018-01-23 DIAGNOSIS — R0609 Other forms of dyspnea: Secondary | ICD-10-CM

## 2018-01-23 DIAGNOSIS — K219 Gastro-esophageal reflux disease without esophagitis: Secondary | ICD-10-CM | POA: Diagnosis not present

## 2018-01-23 DIAGNOSIS — Z882 Allergy status to sulfonamides status: Secondary | ICD-10-CM

## 2018-01-23 DIAGNOSIS — C3490 Malignant neoplasm of unspecified part of unspecified bronchus or lung: Secondary | ICD-10-CM

## 2018-01-23 NOTE — Telephone Encounter (Signed)
Printed avs and calender of upcoming appointment. Per 2/27 los

## 2018-01-23 NOTE — Progress Notes (Signed)
[place] [phone]   [phone]  OFFICE PROGRESS NOTE  [name], MD [place] [address]  DIAGNOSIS: Recurrent non-small cell lung cancer, adenocarcinoma with negative EGFR and ALK mutations initially diagnosed as a stage IIA in March 2008.  PRIOR THERAPY: 1. Status post concurrent chemoradiation with weekly carboplatin and paclitaxel. Last dose was given Apr 12, 2007. 2. Status post 3 cycles of consolidation chemotherapy with docetaxel. Last dose was given July 12, 2007. 3. 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 4. Status post 39 cycles of maintenance Alimta at a dose of 500 mg/m2. Last dose was given February 25, 2010, discontinued secondary to persistent anemia, but the patient has stable disease. 5. Tarceva 150 mg by mouth daily. Status post approximately 37 months of therapy.   6. Tarceva 100 mg by mouth daily, status post 13 months of treatment.  CURRENT THERAPY: Observation.  INTERVAL HISTORY: Diane [name] 77 y.o. female returns to the clinic today for 3 months follow-up visit.  The patient is feeling fine today with no specific complaints except for fatigue and shortness of breath with exertion.  She was recently diagnosed with atrial fibrillation and she is currently on Eliquis.  She denied having any chest pain but has occasional hemoptysis after she started Eliquis.  She denied having any fever or chills.  She has no nausea, vomiting, diarrhea or constipation.  The patient denied having any recent weight loss or night sweats.  MEDICAL HISTORY: Past Medical History:  Diagnosis Date  . Aortic atherosclerosis ([place]) 12/03/2017  . CAD (coronary artery disease), native coronary artery 12/03/2017   Noted on CT scan by calcification   . CHF (congestive heart failure) ([place])   . Chronic diastolic heart failure ([place]) 12/03/2017  . GERD  (gastroesophageal reflux disease)   . Heart murmur   . History of blood transfusion    "related to chemo" (12/05/2017)  . Hypertension   . Hypertensive heart disease 12/03/2017  . Kidney disease, chronic, stage III (GFR 30-59 ml/min) ([place]) 12/03/2017  . Lung cancer ([place]) dx'd 02/2010  . Mitral valve prolapse   . Morbid obesity ([place]) 12/03/2017  . 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 Persistent atrial fibrillation ([place]) 12/03/2017  . Restrictive lung disease 05/23/2017    ALLERGIES:  is allergic to septra ds [sulfamethoxazole-trimethoprim].  MEDICATIONS:  Current Outpatient Medications  Medication Sig Dispense Refill  . albuterol (PROVENTIL HFA;VENTOLIN HFA) 108 (90 Base) MCG/ACT inhaler Inhale 1-2 puffs into the lungs every 6 (six) hours as needed for wheezing or shortness of breath. 1 Inhaler 0  . amiodarone (PACERONE) 200 MG tablet Take 1 tablet (200 mg total) by mouth 2 (two) times daily. 60 tablet 0  . apixaban (ELIQUIS) 5 MG TABS tablet Take 5 mg by mouth 2 (two) times daily.    . calcitRIOL (ROCALTROL) 0.25 MCG capsule Take 0.25 mcg by mouth every Monday, Wednesday, and Friday.    . carvedilol (COREG) 25 MG tablet Take 25 mg by mouth 2 (two) times daily.    . hydrALAZINE (APRESOLINE) 25 MG tablet Take 25 mg by mouth 2 (two) times daily.    . hydroxypropyl methylcellulose / hypromellose (ISOPTO TEARS / GONIOVISC) 2.5 % ophthalmic solution Place 1 drop into both eyes as needed for dry eyes.    . polyethylene glycol (MIRALAX / GLYCOLAX) packet Take 17 g by mouth daily as needed for  mild constipation. Reported on 06/05/2016    . torsemide (DEMADEX) 20 MG tablet Take 1 tablet (20 mg total) by mouth daily. 30 tablet 0  . umeclidinium-vilanterol (ANORO ELLIPTA) 62.5-25 MCG/INH AEPB Inhale 1 puff into the lungs daily. 1 each 5  . umeclidinium-vilanterol (ANORO ELLIPTA) 62.5-25 MCG/INH AEPB Inhale 1 puff into the lungs daily. 2 each 0   No current facility-administered medications for this  visit.    Facility-Administered Medications Ordered in Other Visits  Medication Dose Route Frequency Provider Last Rate Last Dose  . sodium chloride 0.9 % injection 10 mL  10 mL Intravenous PRN Curt Bears, MD   10 mL at 10/14/13 1030    SURGICAL HISTORY:  Past Surgical History:  Procedure Laterality Date  . CARDIOVERSION N/A 12/06/2017   Procedure: CARDIOVERSION;  Surgeon: Jacolyn Reedy, MD;  Location: [facility] ENDOSCOPY;  Service: Cardiovascular;  Laterality: N/A;  . CATARACT EXTRACTION W/ INTRAOCULAR LENS  IMPLANT, BILATERAL Bilateral   . DERMABRASION  ~ 1966   "face; for acne"  . PAROTID GLAND TUMOR EXCISION Left   . PORTACATH PLACEMENT  11/03/08-[place]   tip in mid SVC-E. [place]  . THORACENTESIS Right   . TONSILLECTOMY      REVIEW OF SYSTEMS:  A comprehensive review of systems was negative except for: Constitutional: positive for fatigue Respiratory: positive for cough and dyspnea on exertion   PHYSICAL EXAMINATION: General appearance: alert, cooperative, fatigued and no distress Head: Normocephalic, without obvious abnormality, atraumatic Neck: no adenopathy, no JVD, supple, symmetrical, trachea midline and thyroid not enlarged, symmetric, no tenderness/mass/nodules Lymph nodes: Cervical, supraclavicular, and axillary nodes normal. Resp: diminished breath sounds RLL and dullness to percussion RLL Back: symmetric, no curvature. ROM normal. No CVA tenderness. Cardio: regular rate and rhythm, S1, S2 normal, no murmur, click, rub or gallop GI: soft, non-tender; bowel sounds normal; no masses,  no organomegaly Extremities: extremities normal, atraumatic, no cyanosis or edema  ECOG PERFORMANCE STATUS: 1 - Symptomatic but completely ambulatory  Blood pressure (!) 139/48, pulse 92, temperature (!) 97 F (36.1 C), temperature source Oral, resp. rate (!) 22, weight 259 lb 1.6 oz (117.5 kg), SpO2 (!) 89 %.  LABORATORY DATA: Lab Results  Component Value Date   WBC 4.7  01/21/2018   HGB 10.0 (L) 01/21/2018   HCT 30.4 (L) 01/21/2018   MCV 94.3 01/21/2018   PLT 202 01/21/2018      Chemistry      Component Value Date/Time   NA 136 01/21/2018 0927   NA 140 10/01/2017 1116   K 4.3 01/21/2018 0927   K 4.2 10/01/2017 1116   CL 101 01/21/2018 0927   CL 102 05/08/2013 0851   CO2 28 01/21/2018 0927   CO2 29 10/01/2017 1116   BUN 21 01/21/2018 0927   BUN 31.7 (H) 10/01/2017 1116   CREATININE 1.68 (H) 01/21/2018 0927   CREATININE 1.8 (H) 10/01/2017 1116      Component Value Date/Time   CALCIUM 9.2 01/21/2018 0927   CALCIUM 9.5 10/01/2017 1116   ALKPHOS 58 01/21/2018 0927   ALKPHOS 71 10/01/2017 1116   AST 10 01/21/2018 0927   AST 14 10/01/2017 1116   ALT 6 01/21/2018 0927   ALT 8 10/01/2017 1116   BILITOT 1.0 01/21/2018 0927   BILITOT 0.71 10/01/2017 1116       RADIOGRAPHIC STUDIES: Ct Chest Wo Contrast  Result Date: 01/21/2018 CLINICAL DATA:  Follow-up lung cancer. EXAM: CT CHEST WITHOUT CONTRAST TECHNIQUE: Multidetector CT imaging of the chest was performed  following the standard protocol without IV contrast. COMPARISON:  10/01/2017 FINDINGS: Cardiovascular: The heart size is enlarged. Aortic atherosclerosis. Calcification in the RCA, LAD, left circumflex and left main coronary artery noted. No pericardial effusion. Mediastinum/Nodes: Small low-attenuation nodule in the inferior pole of left lobe of thyroid gland measures 11 mm. Calcified nodule within the inferior pole the right lobe measures 1.6 cm. The trachea appears patent and is midline. Normal appearance of the esophagus. No supraclavicular or axillary adenopathy. Enlarging pre-vascular lymph node ventral to the ascending aorta measures 7 mm, image 68/2. Previously 5 mm. 8 mm right paratracheal lymph node noted, image 63/2. Previously 5 mm. Lungs/Pleura: There is a new small left pleural effusion compared with previous exam. Moderate changes of emphysema identified. Chronic right fibrothorax  with calcifications is again noted. This measures 12.5 by 6.7 by 6.5 cm. Previously 12.8 by 6.8 by 6.5 cm. Right lung paramediastinal masslike architectural distortion and fibrosis is again identified compatible with changes due to external beam radiation. Right apical branching density is similar to previous exam, image 40 of series 5. Stable small ground-glass nodule in the left upper lobe measuring 3 mm, image 38/5. New from previous exam is a small left upper lobe lung nodule measuring 4 mm, image 41/5. Unchanged 5 mm left lower lobe lung nodule, image 99 of series 5. Upper Abdomen: No acute abnormality identified. Musculoskeletal: There is degenerative disc disease identified within the thoracic spine. No aggressive lytic or sclerotic bone lesions. IMPRESSION: 1. New small left pleural effusion. Additionally, there are small left-sided mediastinal lymph nodes which are slightly increased in size from previous exam as above. Lastly there is a new small left upper lobe lung nodule measuring 4 mm, nonspecific. 2. Similar appearance of changes due to external beam radiation within the right lung. 3. Stable right fibrothorax 4. Aortic Atherosclerosis (ICD10-I70.0) and Emphysema (ICD10-J43.9). 5. Three vessel coronary artery atherosclerotic calcifications noted. Electronically Signed   By: Kerby Moors M.D.   On: 01/21/2018 13:24    ASSESSMENT AND PLAN:  This is a very pleasant 77 years old white female with recurrent non-small cell lung cancer, adenocarcinoma diagnosed in March 2008 status post several chemotherapy regimens and was on treatment with Tarceva for close to 4 years. The patient is on observation and she is feeling fine except for the recent shortness of breath from atrial fibrillation.  She is currently on Eliquis. She had repeat CT scan of the chest performed recently.  I personally and independently reviewed the scan images and discussed the results with the patient.  Her scan showed no clear  evidence for disease progression except for increased and left pleural effusion as well as mild increase in small mediastinal lymph nodes and a nonspecific 4 mm left upper lobe nodule. I discussed the scan results with the patient and recommended for her to continue on observation with close monitoring. I will see her back for follow-up visit in 3 months for evaluation with repeat CT scan of the chest. The patient was advised to call immediately if she has any concerning symptoms in the interval. The patient voices understanding of current disease status and treatment options and is in agreement with the current care plan. All questions were answered. The patient knows to call the clinic with any problems, questions or concerns. We can certainly see the patient much sooner if necessary. I spent 10 minutes counseling the patient face to face. The total time spent in the appointment was 15 minutes.  Disclaimer: This note  was dictated with voice recognition software. Similar sounding words can inadvertently be transcribed and may not be corrected upon review.

## 2018-01-24 ENCOUNTER — Telehealth: Payer: Self-pay | Admitting: Adult Health

## 2018-01-24 NOTE — Telephone Encounter (Signed)
The OV note from 2/21 with TP has been faxed to Dr. Thurman Coyer office.  Nothing further is needed.

## 2018-01-28 ENCOUNTER — Other Ambulatory Visit: Payer: Self-pay | Admitting: Medical Oncology

## 2018-01-28 ENCOUNTER — Telehealth: Payer: Self-pay | Admitting: Medical Oncology

## 2018-01-28 ENCOUNTER — Encounter: Payer: Self-pay | Admitting: Adult Health

## 2018-01-28 DIAGNOSIS — C3491 Malignant neoplasm of unspecified part of right bronchus or lung: Secondary | ICD-10-CM

## 2018-01-28 NOTE — Telephone Encounter (Addendum)
She thinks she needs a thoracentesis to improve her breathing. Per Mohamed, thoracentesis ordered and LVM on pt phone.

## 2018-01-28 NOTE — Telephone Encounter (Addendum)
I called [name] to see if pt needs to come off eloquis. I was told it is not necessary . Pt was calling cardiologist to make sure. Per Diane [name] she does not need to hold eloquis.

## 2018-01-29 ENCOUNTER — Telehealth: Payer: Self-pay | Admitting: *Deleted

## 2018-01-29 NOTE — Telephone Encounter (Signed)
Multiple attempts to reach [facility] at Dr. Ezekiel Slocumb office regarding Eloquis. Unable to reach. Lm with support staff. Called pt advised Per MD-  it is not nescessary to stop Eloquis for Thoracentesis. Gave pt phone # to Central scheduling for any appt changes/concerns.

## 2018-01-31 ENCOUNTER — Other Ambulatory Visit: Payer: Self-pay | Admitting: Internal Medicine

## 2018-01-31 ENCOUNTER — Ambulatory Visit (HOSPITAL_COMMUNITY)
Admission: RE | Admit: 2018-01-31 | Discharge: 2018-01-31 | Disposition: A | Discharge status: Home / Self Care | Payer: Medicare Other | Source: Ambulatory Visit | Attending: Internal Medicine | Admitting: Internal Medicine

## 2018-01-31 DIAGNOSIS — C3491 Malignant neoplasm of unspecified part of right bronchus or lung: Secondary | ICD-10-CM

## 2018-01-31 DIAGNOSIS — J90 Pleural effusion, not elsewhere classified: Secondary | ICD-10-CM | POA: Diagnosis not present

## 2018-01-31 MED ORDER — LIDOCAINE HCL 1 % IJ SOLN
INTRAMUSCULAR | Status: AC
  Filled 2018-01-31: qty 10

## 2018-01-31 NOTE — Progress Notes (Signed)
Pt presented today for US guided therapeutic left thoracentesis. On limited US left post chest there is only a small effusion noted with lung field in close proximity. Above d/w Dr. [name] and procedure cancelled. Pt notified .

## 2018-02-01 ENCOUNTER — Telehealth: Payer: Self-pay | Admitting: Adult Health

## 2018-02-01 ENCOUNTER — Other Ambulatory Visit: Payer: Self-pay | Admitting: Adult Health

## 2018-02-01 DIAGNOSIS — J9611 Chronic respiratory failure with hypoxia: Secondary | ICD-10-CM

## 2018-02-01 DIAGNOSIS — J441 Chronic obstructive pulmonary disease with (acute) exacerbation: Secondary | ICD-10-CM

## 2018-02-01 NOTE — Progress Notes (Signed)
Error Parke Poisson, CMA 02/01/18

## 2018-02-01 NOTE — Telephone Encounter (Signed)
Patient seen 2.21.19 by TP and ONO on room air ordered 3.4.19 ONO results received and reviewed by TP: begin O2 2lpm at bedtime  Called spoke with patient who is already on O2 2lpm continuously She is aware to continue this setting and wear O2 at bedtime and with naps  Nothing further needed Order snet to [facility] for documentation purposes  Will sign off

## 2018-02-12 MED ORDER — IOPAMIDOL (ISOVUE-300) INJECTION 61%
INTRAVENOUS | Status: AC
  Filled 2018-02-12: qty 50

## 2018-02-28 ENCOUNTER — Ambulatory Visit: Payer: Medicare Other | Admitting: Emergency Medicine

## 2018-02-28 ENCOUNTER — Encounter: Payer: Self-pay | Admitting: Emergency Medicine

## 2018-02-28 DIAGNOSIS — J449 Chronic obstructive pulmonary disease, unspecified: Secondary | ICD-10-CM | POA: Diagnosis not present

## 2018-02-28 DIAGNOSIS — I5033 Acute on chronic diastolic (congestive) heart failure: Secondary | ICD-10-CM | POA: Diagnosis not present

## 2018-02-28 DIAGNOSIS — C3491 Malignant neoplasm of unspecified part of right bronchus or lung: Secondary | ICD-10-CM | POA: Diagnosis not present

## 2018-02-28 DIAGNOSIS — J984 Other disorders of lung: Secondary | ICD-10-CM

## 2018-02-28 NOTE — Patient Instructions (Signed)
We will continue anoro 1 inhalation once a day Keep albuterol inhaler available to use 2 puffs if needed for shortness of breath, chest tightness, wheezing. Continue your oxygen at 2 L/min with exertion. Continue your heart medications as directed by Dr. Wynonia Lawman. Follow with Dr. [name] with CT scan of your chest as planned. Follow with Dr Lamonte Sakai in 6 months or sooner if you have any problems

## 2018-02-28 NOTE — Assessment & Plan Note (Signed)
Following with Dr. [name].  Serial CT scans per his plans.

## 2018-02-28 NOTE — Progress Notes (Signed)
Subjective:    Patient ID: Diane [name], female    DOB: [birth year], 77 y.o.   MRN: [id number]  HPI Ms Magner is a 77 year old woman, former smoker, with a history of COPD, hypertension and fairly new dx atrial fibrillation with diastolic CHF. Underwent DCCV.  She is on amiodarone and anticoagulation.  Most recent pulmonary function testing was 05/23/17 which I have reviewed.  This shows evidence for mixed obstruction and restriction, severely decreased FEV1 without a bronchodilator response.  Her total lung capacity is reduced, decreased diffusion capacity that corrects to the normal range when adjusted for alveolar volume. She was started on Anoro at her last visit here. She has albuterol but does not use. She cannot tell how much the Anoro has helped some w her wheezing.   She is on oxygen at 2L/min with exertion. She remains active.   History of non-small cell lung cancer originally diagnosed 2008, with a recurrence.  He has been treated with chemotherapy and long-term Tarceva, 4 yrs and able to come off of this 1 yr ago.  She is currently on observation.  She has been under surveillance, most recent CT scan of the chest 01/21/18 which I reviewed.  This showed chronic right sided changes post radiation treatment, a stable small groundglass nodule the left upper lobe (3 mm), a new left upper lobe 4 mm nodule, unchanged 5 mm left lower lobe nodule.  There was also a small new left pleural effusion.  Thoracentesis was attempted 01/31/18 but the effusion was too small to sample.   Review of Systems   Past Medical History:  Diagnosis Date  . Aortic atherosclerosis ([place]) 12/03/2017  . CAD (coronary artery disease), native coronary artery 12/03/2017   Noted on CT scan by calcification   . CHF (congestive heart failure) ([place])   . Chronic diastolic heart failure ([place]) 12/03/2017  . GERD (gastroesophageal reflux disease)   . Heart murmur   . History of blood transfusion    "related to chemo" (12/05/2017)    . Hypertension   . Hypertensive heart disease 12/03/2017  . Kidney disease, chronic, stage III (GFR 30-59 ml/min) ([place]) 12/03/2017  . Lung cancer ([place]) dx'd 02/2010  . Mitral valve prolapse   . Morbid obesity ([place]) 12/03/2017  . 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 Persistent atrial fibrillation ([place]) 12/03/2017  . Restrictive lung disease 05/23/2017     Family History  Problem Relation Age of Onset  . Breast cancer Mother   . Heart attack Mother   . Diabetes Father   . Heart attack Father   . Diabetes Brother   . COPD Cousin      Social History   Socioeconomic History  . Marital status: Married    Spouse name: Not on file  . Number of children: Not on file  . Years of education: Not on file  . Highest education level: Not on file  Occupational History  . Not on file  Social Needs  . Financial resource strain: Not hard at all  . Food insecurity:    Worry: Patient refused    Inability: Patient refused  . Transportation needs:    Medical: Patient refused    Non-medical: Patient refused  Tobacco Use  . Smoking status: Former Smoker    Packs/day: 2.00    Years: 40.00    Pack years: 80.00    Types: Cigarettes    Last attempt to quit: 11/27/2001    Years since quitting: 16.2  .  Smokeless tobacco: Never Used  Substance and Sexual Activity  . Alcohol use: Yes    Comment: 12/05/2017 "once in a great while I might have a drink"  . Drug use: No  . Sexual activity: Not Currently  Lifestyle  . Physical activity:    Days per week: 1 day    Minutes per session: 30 min  . Stress: Only a little  Relationships  . Social connections:    Talks on phone: Patient refused    Gets together: Patient refused    Attends religious service: Patient refused    Active member of club or organization: Patient refused    Attends meetings of clubs or organizations: Patient refused    Relationship status: Patient refused  . Intimate partner violence:    Fear of current or ex partner:  Patient refused    Emotionally abused: Patient refused    Physically abused: Patient refused    Forced sexual activity: Patient refused  Other Topics Concern  . Not on file  Social History Narrative   [facility] (02/19/17):   Patient is originally from [place] [facility] [place] [facility] [place]. She has also lived in [place]. She has worked primarily Veterinary surgeon. Has a dog currently. No bird or mold exposure.      Allergies  Allergen Reactions  . Septra Ds [Sulfamethoxazole-Trimethoprim] Nausea Only     Outpatient Medications Prior to Visit  Medication Sig Dispense Refill  . amiodarone (PACERONE) 200 MG tablet Take 1 tablet (200 mg total) by mouth 2 (two) times daily. 60 tablet 0  . apixaban (ELIQUIS) 5 MG TABS tablet Take 5 mg by mouth 2 (two) times daily.    . calcitRIOL (ROCALTROL) 0.25 MCG capsule Take 0.25 mcg by mouth every Monday, Wednesday, and Friday.    . carvedilol (COREG) 25 MG tablet Take 25 mg by mouth 2 (two) times daily.    . hydrALAZINE (APRESOLINE) 25 MG tablet Take 25 mg by mouth 2 (two) times daily.    . hydroxypropyl methylcellulose / hypromellose (ISOPTO TEARS / GONIOVISC) 2.5 % ophthalmic solution Place 1 drop into both eyes as needed for dry eyes.    . polyethylene glycol (MIRALAX / GLYCOLAX) packet Take 17 g by mouth daily as needed for mild constipation. Reported on 06/05/2016    . torsemide (DEMADEX) 20 MG tablet Take 1 tablet (20 mg total) by mouth daily. 30 tablet 0  . umeclidinium-vilanterol (ANORO ELLIPTA) 62.5-25 MCG/INH AEPB Inhale 1 puff into the lungs daily. 1 each 5   Facility-Administered Medications Prior to Visit  Medication Dose Route Frequency Provider Last Rate Last Dose  . sodium chloride 0.9 % injection 10 mL  10 mL Intravenous PRN Curt Bears, MD   10 mL at 10/14/13 1030        Objective:   Physical Exam Vitals:   02/28/18 0946  BP: 130/76  Pulse: 87  SpO2: 94%  Weight: 255 lb (115.7 kg)  Height: 5\' 5"  (1.651 m)   Gen: Pleasant, well-nourished, in no distress,   normal affect  ENT: No lesions,  mouth clear,  oropharynx clear, no postnasal drip  Neck: No JVD, no stridor  Lungs: No use of accessory muscles, R insp squeak, no wheeze  Cardiovascular: RRR, heart sounds normal, no murmur or gallops, trace peripheral edema  Musculoskeletal: No deformities, no cyanosis or clubbing  Neuro: alert, non focal  Skin: Warm, no lesions or rash     Assessment & Plan:  Acute on chronic diastolic CHF (congestive heart failure) ([place]) Characterized by lower extremity  edema.  She also had a left pleural effusion that is likely related, too small to tap.  She has been clinically improved since she was cardioverted back to sinus rhythm.  She is on amiodarone, anticoagulation, diuretics, followed by Dr. Wynonia Lawman.  Cancer of right lung parenchyma [facility]) Following with Dr. [name].  Serial CT scans per his plans.  Restrictive lung disease Due to obesity, some right sided scar post radiation therapy  COPD (chronic obstructive pulmonary disease) ([place]) Unclear that she is gotten a profound clinical response but her wheezing is improved.  I believe we should continue Anoro for now and she agrees.  Albuterol as needed.  Baltazar Apo, MD, PhD 02/28/2018, 10:17 AM [facility] [phone] or if no answer [phone]

## 2018-02-28 NOTE — Assessment & Plan Note (Signed)
Unclear that she is gotten a profound clinical response but her wheezing is improved.  I believe we should continue Anoro for now and she agrees.  Albuterol as needed.

## 2018-02-28 NOTE — Assessment & Plan Note (Signed)
Due to obesity, some right sided scar post radiation therapy

## 2018-02-28 NOTE — Assessment & Plan Note (Signed)
Characterized by lower extremity edema.  She also had a left pleural effusion that is likely related, too small to tap.  She has been clinically improved since she was cardioverted back to sinus rhythm.  She is on amiodarone, anticoagulation, diuretics, followed by Dr. Wynonia Lawman.

## 2018-03-04 ENCOUNTER — Other Ambulatory Visit: Payer: Self-pay | Admitting: Medical Oncology

## 2018-03-04 ENCOUNTER — Inpatient Hospital Stay: Payer: Medicare Other | Attending: Internal Medicine

## 2018-03-04 ENCOUNTER — Inpatient Hospital Stay: Payer: Medicare Other

## 2018-03-04 DIAGNOSIS — C3491 Malignant neoplasm of unspecified part of right bronchus or lung: Secondary | ICD-10-CM

## 2018-03-04 DIAGNOSIS — Z923 Personal history of irradiation: Secondary | ICD-10-CM | POA: Insufficient documentation

## 2018-03-04 DIAGNOSIS — Z452 Encounter for adjustment and management of vascular access device: Secondary | ICD-10-CM | POA: Diagnosis not present

## 2018-03-04 DIAGNOSIS — Z9221 Personal history of antineoplastic chemotherapy: Secondary | ICD-10-CM | POA: Insufficient documentation

## 2018-03-04 DIAGNOSIS — Z95828 Presence of other vascular implants and grafts: Secondary | ICD-10-CM

## 2018-03-04 LAB — CBC WITH DIFFERENTIAL (CANCER CENTER ONLY)
BASOS ABS: 0 10*3/uL (ref 0.0–0.1)
Basophils Relative: 0 %
Eosinophils Absolute: 0.2 10*3/uL (ref 0.0–0.5)
Eosinophils Relative: 4 %
HEMATOCRIT: 28.1 % — AB (ref 34.8–46.6)
Hemoglobin: 8.9 g/dL — ABNORMAL LOW (ref 11.6–15.9)
LYMPHS PCT: 8 %
Lymphs Abs: 0.4 10*3/uL — ABNORMAL LOW (ref 0.9–3.3)
MCH: 31 pg (ref 25.1–34.0)
MCHC: 31.7 g/dL (ref 31.5–36.0)
MCV: 97.9 fL (ref 79.5–101.0)
MONO ABS: 0.7 10*3/uL (ref 0.1–0.9)
MONOS PCT: 15 %
NEUTROS ABS: 3.4 10*3/uL (ref 1.5–6.5)
Neutrophils Relative %: 73 %
Platelet Count: 166 10*3/uL (ref 145–400)
RBC: 2.87 MIL/uL — ABNORMAL LOW (ref 3.70–5.45)
RDW: 15.2 % — AB (ref 11.2–14.5)
WBC Count: 4.7 10*3/uL (ref 3.9–10.3)

## 2018-03-04 LAB — CMP (CANCER CENTER ONLY)
ALK PHOS: 58 U/L (ref 40–150)
ALT: 14 U/L (ref 0–55)
AST: 15 U/L (ref 5–34)
Albumin: 2.9 g/dL — ABNORMAL LOW (ref 3.5–5.0)
Anion gap: 8 (ref 3–11)
BILIRUBIN TOTAL: 0.7 mg/dL (ref 0.2–1.2)
BUN: 28 mg/dL — AB (ref 7–26)
CALCIUM: 9 mg/dL (ref 8.4–10.4)
CO2: 30 mmol/L — ABNORMAL HIGH (ref 22–29)
CREATININE: 1.95 mg/dL — AB (ref 0.60–1.10)
Chloride: 100 mmol/L (ref 98–109)
GFR, EST NON AFRICAN AMERICAN: 24 mL/min — AB (ref >60)
GFR, Est AFR Am: 27 mL/min — ABNORMAL LOW (ref >60)
Glucose, Bld: 109 mg/dL (ref 70–140)
POTASSIUM: 4.1 mmol/L (ref 3.5–5.1)
Sodium: 138 mmol/L (ref 136–145)
TOTAL PROTEIN: 6.2 g/dL — AB (ref 6.4–8.3)

## 2018-03-04 LAB — RESEARCH LABS

## 2018-03-04 MED ORDER — SODIUM CHLORIDE 0.9 % IJ SOLN
10.0000 mL | INTRAMUSCULAR | Status: DC | PRN
  Administered 2018-03-04: 10 mL via INTRAVENOUS
  Filled 2018-03-04: qty 10

## 2018-03-04 MED ORDER — HEPARIN SOD (PORK) LOCK FLUSH 100 UNIT/ML IV SOLN
500.0000 [IU] | Freq: Once | INTRAVENOUS | Status: AC | PRN
Start: 2018-03-04 — End: 2018-03-04
  Administered 2018-03-04: 500 [IU] via INTRAVENOUS
  Filled 2018-03-04: qty 5

## 2018-03-27 ENCOUNTER — Telehealth: Payer: Self-pay | Admitting: Medical Oncology

## 2018-03-27 NOTE — Telephone Encounter (Signed)
Pt needs tooth extraction. Dr Buelah Manis asking if pt had xrt to the jaw. I called and told his staff that she did not receive xrt to jaw.

## 2018-03-27 NOTE — Telephone Encounter (Signed)
Faxed oral surgery form from [facility].

## 2018-04-15 ENCOUNTER — Ambulatory Visit (HOSPITAL_COMMUNITY)
Admission: RE | Admit: 2018-04-15 | Discharge: 2018-04-15 | Disposition: A | Discharge status: Home / Self Care | Payer: Medicare Other | Source: Ambulatory Visit | Attending: Internal Medicine | Admitting: Internal Medicine

## 2018-04-15 ENCOUNTER — Inpatient Hospital Stay: Payer: Medicare Other

## 2018-04-15 ENCOUNTER — Inpatient Hospital Stay: Payer: Medicare Other | Attending: Internal Medicine

## 2018-04-15 ENCOUNTER — Encounter (HOSPITAL_COMMUNITY): Payer: Self-pay

## 2018-04-15 DIAGNOSIS — C3490 Malignant neoplasm of unspecified part of unspecified bronchus or lung: Secondary | ICD-10-CM | POA: Diagnosis not present

## 2018-04-15 DIAGNOSIS — Z923 Personal history of irradiation: Secondary | ICD-10-CM | POA: Insufficient documentation

## 2018-04-15 DIAGNOSIS — Z882 Allergy status to sulfonamides status: Secondary | ICD-10-CM | POA: Diagnosis not present

## 2018-04-15 DIAGNOSIS — Z9981 Dependence on supplemental oxygen: Secondary | ICD-10-CM | POA: Insufficient documentation

## 2018-04-15 DIAGNOSIS — I5032 Chronic diastolic (congestive) heart failure: Secondary | ICD-10-CM | POA: Insufficient documentation

## 2018-04-15 DIAGNOSIS — I2510 Atherosclerotic heart disease of native coronary artery without angina pectoris: Secondary | ICD-10-CM | POA: Insufficient documentation

## 2018-04-15 DIAGNOSIS — I341 Nonrheumatic mitral (valve) prolapse: Secondary | ICD-10-CM | POA: Insufficient documentation

## 2018-04-15 DIAGNOSIS — J189 Pneumonia, unspecified organism: Secondary | ICD-10-CM | POA: Diagnosis not present

## 2018-04-15 DIAGNOSIS — M6281 Muscle weakness (generalized): Secondary | ICD-10-CM | POA: Insufficient documentation

## 2018-04-15 DIAGNOSIS — R0609 Other forms of dyspnea: Secondary | ICD-10-CM | POA: Diagnosis not present

## 2018-04-15 DIAGNOSIS — J432 Centrilobular emphysema: Secondary | ICD-10-CM | POA: Insufficient documentation

## 2018-04-15 DIAGNOSIS — Z79899 Other long term (current) drug therapy: Secondary | ICD-10-CM | POA: Insufficient documentation

## 2018-04-15 DIAGNOSIS — J90 Pleural effusion, not elsewhere classified: Secondary | ICD-10-CM | POA: Diagnosis not present

## 2018-04-15 DIAGNOSIS — Z7901 Long term (current) use of anticoagulants: Secondary | ICD-10-CM | POA: Insufficient documentation

## 2018-04-15 DIAGNOSIS — R5383 Other fatigue: Secondary | ICD-10-CM | POA: Diagnosis not present

## 2018-04-15 DIAGNOSIS — I700 Atherosclerosis of aorta: Secondary | ICD-10-CM | POA: Diagnosis not present

## 2018-04-15 DIAGNOSIS — I130 Hypertensive heart and chronic kidney disease with heart failure and stage 1 through stage 4 chronic kidney disease, or unspecified chronic kidney disease: Secondary | ICD-10-CM | POA: Diagnosis not present

## 2018-04-15 DIAGNOSIS — K219 Gastro-esophageal reflux disease without esophagitis: Secondary | ICD-10-CM | POA: Diagnosis not present

## 2018-04-15 DIAGNOSIS — Z9221 Personal history of antineoplastic chemotherapy: Secondary | ICD-10-CM | POA: Diagnosis not present

## 2018-04-15 DIAGNOSIS — Z85818 Personal history of malignant neoplasm of other sites of lip, oral cavity, and pharynx: Secondary | ICD-10-CM | POA: Diagnosis not present

## 2018-04-15 DIAGNOSIS — C3491 Malignant neoplasm of unspecified part of right bronchus or lung: Secondary | ICD-10-CM | POA: Diagnosis present

## 2018-04-15 DIAGNOSIS — I481 Persistent atrial fibrillation: Secondary | ICD-10-CM | POA: Insufficient documentation

## 2018-04-15 DIAGNOSIS — J984 Other disorders of lung: Secondary | ICD-10-CM | POA: Insufficient documentation

## 2018-04-15 DIAGNOSIS — J941 Fibrothorax: Secondary | ICD-10-CM | POA: Insufficient documentation

## 2018-04-15 DIAGNOSIS — Z95828 Presence of other vascular implants and grafts: Secondary | ICD-10-CM

## 2018-04-15 DIAGNOSIS — R51 Headache: Secondary | ICD-10-CM | POA: Diagnosis not present

## 2018-04-15 DIAGNOSIS — D649 Anemia, unspecified: Secondary | ICD-10-CM | POA: Insufficient documentation

## 2018-04-15 LAB — CBC WITH DIFFERENTIAL (CANCER CENTER ONLY)
BASOS ABS: 0 10*3/uL (ref 0.0–0.1)
BASOS PCT: 1 %
EOS ABS: 0.1 10*3/uL (ref 0.0–0.5)
Eosinophils Relative: 3 %
HEMATOCRIT: 25.2 % — AB (ref 34.8–46.6)
HEMOGLOBIN: 8.1 g/dL — AB (ref 11.6–15.9)
Lymphocytes Relative: 9 %
Lymphs Abs: 0.4 10*3/uL — ABNORMAL LOW (ref 0.9–3.3)
MCH: 29.2 pg (ref 25.1–34.0)
MCHC: 32.3 g/dL (ref 31.5–36.0)
MCV: 90.4 fL (ref 79.5–101.0)
Monocytes Absolute: 0.6 10*3/uL (ref 0.1–0.9)
Monocytes Relative: 14 %
NEUTROS ABS: 3.4 10*3/uL (ref 1.5–6.5)
NEUTROS PCT: 73 %
Platelet Count: 189 10*3/uL (ref 145–400)
RBC: 2.78 MIL/uL — ABNORMAL LOW (ref 3.70–5.45)
RDW: 14.6 % — AB (ref 11.2–14.5)
WBC Count: 4.5 10*3/uL (ref 3.9–10.3)

## 2018-04-15 LAB — CMP (CANCER CENTER ONLY)
ALBUMIN: 3 g/dL — AB (ref 3.5–5.0)
ALK PHOS: 58 U/L (ref 40–150)
ALT: 17 U/L (ref 0–55)
ANION GAP: 7 (ref 3–11)
AST: 14 U/L (ref 5–34)
BUN: 25 mg/dL (ref 7–26)
CALCIUM: 8.8 mg/dL (ref 8.4–10.4)
CO2: 31 mmol/L — AB (ref 22–29)
CREATININE: 1.95 mg/dL — AB (ref 0.60–1.10)
Chloride: 100 mmol/L (ref 98–109)
GFR, Est AFR Am: 27 mL/min — ABNORMAL LOW (ref >60)
GFR, Estimated: 24 mL/min — ABNORMAL LOW (ref >60)
Glucose, Bld: 121 mg/dL (ref 70–140)
POTASSIUM: 4.3 mmol/L (ref 3.5–5.1)
SODIUM: 138 mmol/L (ref 136–145)
TOTAL PROTEIN: 6.4 g/dL (ref 6.4–8.3)
Total Bilirubin: 0.5 mg/dL (ref 0.2–1.2)

## 2018-04-15 MED ORDER — SODIUM CHLORIDE 0.9 % IJ SOLN
10.0000 mL | INTRAMUSCULAR | Status: DC | PRN
  Administered 2018-04-15: 10 mL via INTRAVENOUS
  Filled 2018-04-15: qty 10

## 2018-04-23 ENCOUNTER — Other Ambulatory Visit: Payer: Medicare Other

## 2018-04-25 ENCOUNTER — Encounter: Payer: Self-pay | Admitting: Internal Medicine

## 2018-04-25 ENCOUNTER — Telehealth: Payer: Self-pay | Admitting: Internal Medicine

## 2018-04-25 ENCOUNTER — Inpatient Hospital Stay: Payer: Medicare Other

## 2018-04-25 ENCOUNTER — Telehealth: Payer: Self-pay | Admitting: Medical Oncology

## 2018-04-25 ENCOUNTER — Inpatient Hospital Stay (HOSPITAL_BASED_OUTPATIENT_CLINIC_OR_DEPARTMENT_OTHER): Payer: Medicare Other | Admitting: Internal Medicine

## 2018-04-25 VITALS — BP 160/65 | HR 92 | Temp 98.3°F | Resp 18 | Ht 65.0 in | Wt 253.0 lb

## 2018-04-25 DIAGNOSIS — C3491 Malignant neoplasm of unspecified part of right bronchus or lung: Secondary | ICD-10-CM | POA: Diagnosis not present

## 2018-04-25 DIAGNOSIS — I130 Hypertensive heart and chronic kidney disease with heart failure and stage 1 through stage 4 chronic kidney disease, or unspecified chronic kidney disease: Secondary | ICD-10-CM | POA: Diagnosis not present

## 2018-04-25 DIAGNOSIS — N183 Chronic kidney disease, stage 3 unspecified: Secondary | ICD-10-CM

## 2018-04-25 DIAGNOSIS — R069 Unspecified abnormalities of breathing: Secondary | ICD-10-CM | POA: Diagnosis not present

## 2018-04-25 DIAGNOSIS — Z79899 Other long term (current) drug therapy: Secondary | ICD-10-CM

## 2018-04-25 DIAGNOSIS — I2510 Atherosclerotic heart disease of native coronary artery without angina pectoris: Secondary | ICD-10-CM

## 2018-04-25 DIAGNOSIS — I341 Nonrheumatic mitral (valve) prolapse: Secondary | ICD-10-CM

## 2018-04-25 DIAGNOSIS — Z923 Personal history of irradiation: Secondary | ICD-10-CM | POA: Diagnosis not present

## 2018-04-25 DIAGNOSIS — I5032 Chronic diastolic (congestive) heart failure: Secondary | ICD-10-CM

## 2018-04-25 DIAGNOSIS — Z9221 Personal history of antineoplastic chemotherapy: Secondary | ICD-10-CM | POA: Diagnosis not present

## 2018-04-25 DIAGNOSIS — D539 Nutritional anemia, unspecified: Secondary | ICD-10-CM

## 2018-04-25 DIAGNOSIS — I4819 Other persistent atrial fibrillation: Secondary | ICD-10-CM

## 2018-04-25 DIAGNOSIS — D649 Anemia, unspecified: Secondary | ICD-10-CM

## 2018-04-25 DIAGNOSIS — I481 Persistent atrial fibrillation: Secondary | ICD-10-CM | POA: Diagnosis not present

## 2018-04-25 DIAGNOSIS — Z9981 Dependence on supplemental oxygen: Secondary | ICD-10-CM

## 2018-04-25 DIAGNOSIS — J449 Chronic obstructive pulmonary disease, unspecified: Secondary | ICD-10-CM

## 2018-04-25 DIAGNOSIS — R5383 Other fatigue: Secondary | ICD-10-CM | POA: Diagnosis not present

## 2018-04-25 DIAGNOSIS — M6281 Muscle weakness (generalized): Secondary | ICD-10-CM | POA: Diagnosis not present

## 2018-04-25 DIAGNOSIS — R51 Headache: Secondary | ICD-10-CM | POA: Diagnosis not present

## 2018-04-25 DIAGNOSIS — Z882 Allergy status to sulfonamides status: Secondary | ICD-10-CM

## 2018-04-25 DIAGNOSIS — Z7901 Long term (current) use of anticoagulants: Secondary | ICD-10-CM

## 2018-04-25 DIAGNOSIS — Z85818 Personal history of malignant neoplasm of other sites of lip, oral cavity, and pharynx: Secondary | ICD-10-CM

## 2018-04-25 DIAGNOSIS — K219 Gastro-esophageal reflux disease without esophagitis: Secondary | ICD-10-CM

## 2018-04-25 LAB — VITAMIN B12: VITAMIN B 12: 125 pg/mL — AB (ref 180–914)

## 2018-04-25 LAB — CBC WITH DIFFERENTIAL (CANCER CENTER ONLY)
BASOS ABS: 0 10*3/uL (ref 0.0–0.1)
BASOS PCT: 1 %
Eosinophils Absolute: 0.2 10*3/uL (ref 0.0–0.5)
Eosinophils Relative: 4 %
HEMATOCRIT: 28.5 % — AB (ref 34.8–46.6)
HEMOGLOBIN: 8.8 g/dL — AB (ref 11.6–15.9)
Lymphocytes Relative: 11 %
Lymphs Abs: 0.5 10*3/uL — ABNORMAL LOW (ref 0.9–3.3)
MCH: 28.4 pg (ref 25.1–34.0)
MCHC: 30.9 g/dL — ABNORMAL LOW (ref 31.5–36.0)
MCV: 91.9 fL (ref 79.5–101.0)
MONOS PCT: 11 %
Monocytes Absolute: 0.5 10*3/uL (ref 0.1–0.9)
NEUTROS ABS: 3.4 10*3/uL (ref 1.5–6.5)
NEUTROS PCT: 73 %
Platelet Count: 167 10*3/uL (ref 145–400)
RBC: 3.1 MIL/uL — ABNORMAL LOW (ref 3.70–5.45)
RDW: 14.3 % (ref 11.2–14.5)
WBC Count: 4.6 10*3/uL (ref 3.9–10.3)

## 2018-04-25 LAB — FOLATE: Folate: 18.2 ng/mL (ref >5.9)

## 2018-04-25 LAB — IRON AND TIBC
IRON: 35 ug/dL — AB (ref 41–142)
SATURATION RATIOS: 9 % — AB (ref 21–57)
TIBC: 384 ug/dL (ref 236–444)
UIBC: 349 ug/dL

## 2018-04-25 LAB — RETICULOCYTES
RBC.: 3.1 MIL/uL — ABNORMAL LOW (ref 3.70–5.45)
Retic Count, Absolute: 62 10*3/uL (ref 33.7–90.7)
Retic Ct Pct: 2 % (ref 0.7–2.1)

## 2018-04-25 LAB — FERRITIN: Ferritin: 58 ng/mL (ref 9–269)

## 2018-04-25 NOTE — Telephone Encounter (Signed)
I instructed pt to take OTC iron 2-3 times a day. Pt voiced understanding.

## 2018-04-25 NOTE — Telephone Encounter (Signed)
Scheduled appt per 5/30 los - gave patient AVS and calender per los. Scheduled next f/u appt with on 6/24 due to availability in 3 wks- pt preference to be on MM schedule. Marland Kitchen

## 2018-04-25 NOTE — Progress Notes (Signed)
[place] [phone]   [phone]  OFFICE PROGRESS NOTE  [name], MD [place] [id number]  DIAGNOSIS: Recurrent non-small cell lung cancer, adenocarcinoma with negative EGFR and ALK mutations initially diagnosed as a stage IIA in March 2008.  PRIOR THERAPY: 1. Status post concurrent chemoradiation with weekly carboplatin and paclitaxel. Last dose was given Apr 12, 2007. 2. Status post 3 cycles of consolidation chemotherapy with docetaxel. Last dose was given July 12, 2007. 3. 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 4. Status post 39 cycles of maintenance Alimta at a dose of 500 mg/m2. Last dose was given February 25, 2010, discontinued secondary to persistent anemia, but the patient has stable disease. 5. Tarceva 150 mg by mouth daily. Status post approximately 37 months of therapy.   6. Tarceva 100 mg by mouth daily, status post 13 months of treatment.  CURRENT THERAPY: Observation.  INTERVAL HISTORY: Diane [name] 77 y.o. female returns to the clinic today for follow-up visit.  The patient is complaining of increasing fatigue and weakness as well as shortness of breath at baseline increased with exertion.  She is currently on home oxygen.  She denied having any chest pain but has mild cough with no hemoptysis.  She denied having any recent weight loss or night sweats.  She has no nausea, vomiting, diarrhea or constipation.  She denied having any black tarry stool.  The patient is currently on treatment with Eliquis for atrial fibrillation.  She denied having any bleeding, bruises or ecchymosis.  She had a repeat CT scan of the chest performed recently and she is here for evaluation and discussion of her risk her results.  MEDICAL HISTORY: Past Medical History:  Diagnosis Date  . Aortic atherosclerosis ([place]) 12/03/2017  . CAD (coronary artery  disease), native coronary artery 12/03/2017   Noted on CT scan by calcification   . CHF (congestive heart failure) ([place])   . Chronic diastolic heart failure ([place]) 12/03/2017  . GERD (gastroesophageal reflux disease)   . Heart murmur   . History of blood transfusion    "related to chemo" (12/05/2017)  . Hypertension   . Hypertensive heart disease 12/03/2017  . Kidney disease, chronic, stage III (GFR 30-59 ml/min) ([place]) 12/03/2017  . Lung cancer ([place]) dx'd 02/2010  . Mitral valve prolapse   . Morbid obesity ([place]) 12/03/2017  . 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 Persistent atrial fibrillation ([place]) 12/03/2017  . Restrictive lung disease 05/23/2017    ALLERGIES:  is allergic to septra ds [sulfamethoxazole-trimethoprim].  MEDICATIONS:  Current Outpatient Medications  Medication Sig Dispense Refill  . amiodarone (PACERONE) 200 MG tablet Take 1 tablet (200 mg total) by mouth 2 (two) times daily. 60 tablet 0  . apixaban (ELIQUIS) 5 MG TABS tablet Take 5 mg by mouth 2 (two) times daily.    . calcitRIOL (ROCALTROL) 0.25 MCG capsule Take 0.25 mcg by mouth every Monday, Wednesday, and Friday.    . carvedilol (COREG) 25 MG tablet Take 25 mg by mouth 2 (two) times daily.    . hydrALAZINE (APRESOLINE) 25 MG tablet Take 25 mg by mouth 2 (two) times daily.    . hydroxypropyl methylcellulose / hypromellose (ISOPTO TEARS / GONIOVISC) 2.5 % ophthalmic solution Place 1 drop into both eyes as needed for dry eyes.    . polyethylene glycol (MIRALAX / GLYCOLAX) packet Take 17 g by mouth  daily as needed for mild constipation. Reported on 06/05/2016    . torsemide (DEMADEX) 20 MG tablet Take 1 tablet (20 mg total) by mouth daily. 30 tablet 0  . umeclidinium-vilanterol (ANORO ELLIPTA) 62.5-25 MCG/INH AEPB Inhale 1 puff into the lungs daily. 1 each 5   No current facility-administered medications for this visit.    Facility-Administered Medications Ordered in Other Visits  Medication Dose Route Frequency  Provider Last Rate Last Dose  . sodium chloride 0.9 % injection 10 mL  10 mL Intravenous PRN Curt Bears, MD   10 mL at 10/14/13 1030    SURGICAL HISTORY:  Past Surgical History:  Procedure Laterality Date  . CARDIOVERSION N/A 12/06/2017   Procedure: CARDIOVERSION;  Surgeon: Jacolyn Reedy, MD;  Location: [facility] ENDOSCOPY;  Service: Cardiovascular;  Laterality: N/A;  . CATARACT EXTRACTION W/ INTRAOCULAR LENS  IMPLANT, BILATERAL Bilateral   . DERMABRASION  ~ 1966   "face; for acne"  . PAROTID GLAND TUMOR EXCISION Left   . PORTACATH PLACEMENT  11/03/08-[place]   tip in mid SVC-[place]. [place]  . THORACENTESIS Right   . TONSILLECTOMY      REVIEW OF SYSTEMS:  Constitutional: positive for fatigue Eyes: negative Ears, nose, mouth, throat, and face: negative Respiratory: positive for cough and dyspnea on exertion Cardiovascular: negative Gastrointestinal: negative Genitourinary:negative Integument/breast: negative Hematologic/lymphatic: negative Musculoskeletal:positive for muscle weakness Neurological: negative Behavioral/Psych: negative Endocrine: negative Allergic/Immunologic: negative   PHYSICAL EXAMINATION: General appearance: alert, cooperative, fatigued and no distress Head: Normocephalic, without obvious abnormality, atraumatic Neck: no adenopathy, no JVD, supple, symmetrical, trachea midline and thyroid not enlarged, symmetric, no tenderness/mass/nodules Lymph nodes: Cervical, supraclavicular, and axillary nodes normal. Resp: diminished breath sounds RLL and dullness to percussion RLL Back: symmetric, no curvature. ROM normal. No CVA tenderness. Cardio: regular rate and rhythm, S1, S2 normal, no murmur, click, rub or gallop GI: soft, non-tender; bowel sounds normal; no masses,  no organomegaly Extremities: extremities normal, atraumatic, no cyanosis or edema Neurologic: Alert and oriented X 3, normal strength and tone. Normal symmetric reflexes. Normal coordination and  gait  ECOG PERFORMANCE STATUS: 1 - Symptomatic but completely ambulatory  Blood pressure (!) 160/65, pulse 92, temperature 98.3 F (36.8 C), temperature source Oral, resp. rate 18, height _0  (1.651 m), weight 253 lb (114.8 kg), SpO2 98 %.  LABORATORY DATA: Lab Results  Component Value Date   WBC 4.5 04/15/2018   HGB 8.1 (L) 04/15/2018   HCT 25.2 (L) 04/15/2018   MCV 90.4 04/15/2018   PLT 189 04/15/2018      Chemistry      Component Value Date/Time   NA 138 04/15/2018 1140   NA 140 10/01/2017 1116   K 4.3 04/15/2018 1140   K 4.2 10/01/2017 1116   CL 100 04/15/2018 1140   CL 102 05/08/2013 0851   CO2 31 (H) 04/15/2018 1140   CO2 29 10/01/2017 1116   BUN 25 04/15/2018 1140   BUN 31.7 (H) 10/01/2017 1116   CREATININE 1.95 (H) 04/15/2018 1140   CREATININE 1.8 (H) 10/01/2017 1116      Component Value Date/Time   CALCIUM 8.8 04/15/2018 1140   CALCIUM 9.5 10/01/2017 1116   ALKPHOS 58 04/15/2018 1140   ALKPHOS 71 10/01/2017 1116   AST 14 04/15/2018 1140   AST 14 10/01/2017 1116   ALT 17 04/15/2018 1140   ALT 8 10/01/2017 1116   BILITOT 0.5 04/15/2018 1140   BILITOT 0.71 10/01/2017 1116       RADIOGRAPHIC STUDIES: Ct Chest Wo  Contrast  Result Date: 04/15/2018 CLINICAL DATA:  Right lung cancer, status post chemotherapy and radiation therapy. EXAM: CT CHEST WITHOUT CONTRAST TECHNIQUE: Multidetector CT imaging of the chest was performed following the standard protocol without IV contrast. COMPARISON:  01/21/2018. FINDINGS: Cardiovascular: Atherosclerotic calcification of the arterial vasculature, including three-vessel involvement of the coronary arteries. Heart is enlarged. No pericardial effusion. Left IJ Port-A-Cath terminates in the SVC. Mediastinum/Nodes: Low internal jugular lymph nodes measure up to 7 mm on the left, as before. Mediastinal lymph nodes measure up to 10 mm in the prevascular space, previously 7 mm. Hilar regions are difficult to evaluate without IV  contrast. No axillary adenopathy. There may be mild esophageal wall thickening. Lungs/Pleura: Centrilobular emphysema. Right hilar soft tissue with bronchiectasis and parenchymal retraction as well as new right lower lobe bronchial obstruction. Associated new airspace opacification in the right lower lobe. Complex moderate loculated right pleural fluid collection, grossly similar. Amorphous subpleural ground-glass in the right middle lobe (series 7, image 55), unchanged. Mosaic attenuation in the lungs. Trace left pleural fluid, decreased. Upper Abdomen: Visualized portions of the liver and gallbladder are unremarkable. Low-attenuation thickening of the right adrenal gland. Visualized portions of the left adrenal gland, left kidney, spleen, pancreas, stomach and bowel are grossly unremarkable. No upper abdominal adenopathy. Musculoskeletal: Degenerative changes in the spine. IMPRESSION: 1. Right perihilar soft tissue with new obstruction of right lower lobe bronchi and postobstructive pneumonitis. Difficult to exclude disease recurrence, especially without IV contrast. 2. Right fibrothorax, unchanged. Trace left pleural effusion, decreased. 3. Mediastinal lymph nodes appear minimally enlarged. 4. Aortic atherosclerosis (ICD10-170.0). Three-vessel coronary artery calcification. 5.  Emphysema (ICD10-J43.9). Electronically Signed   By: Lorin Picket M.D.   On: 04/15/2018 15:20    ASSESSMENT AND PLAN:  This is a very pleasant 77 years old white female with recurrent non-small cell lung cancer, adenocarcinoma diagnosed in March 2008 status post several chemotherapy regimens and was on treatment with Tarceva for close to 4 years. The patient has been in observation for more than 1 year now.  She is feeling fine with no concerning complaints except for the baseline shortness of breath. Repeat CT scan of the chest performed recently showed the right perihilar soft tissue with new obstruction of the right lower lobe  bronchi and postobstructive pneumonitis and disease recurrence could not be excluded.  There was also minimally increased in the mediastinal lymph nodes.  I personally and independently reviewed the scan images and discussed the results with the patient today. I recommended for her to see Dr. Lamonte Sakai for consideration of repeat bronchoscopy and evaluation of any central obstructive mass in the right lower lobe. For the persistent anemia, I gave the patient a stool cards to rule out Hemoccult today.  I also will check anemia panel including repeat CBC, iron study, ferritin, serum folate and vitamin B12. I will see the patient back for follow-up visit in 3 weeks for reevaluation and discussion of her lab results and further recommendation regarding her anemia. For hypertension she will continue with her current blood pressure medications and consult with her primary care physician for adjustment of her medication if needed. She was advised to call immediately if she has any concerning symptoms in the interval. The patient voices understanding of current disease status and treatment options and is in agreement with the current care plan. All questions were answered. The patient knows to call the clinic with any problems, questions or concerns. We can certainly see the patient much sooner if  necessary.  Disclaimer: This note was dictated with voice recognition software. Similar sounding words can inadvertently be transcribed and may not be corrected upon review.

## 2018-04-26 LAB — ERYTHROPOIETIN: ERYTHROPOIETIN: 50.2 m[IU]/mL — AB (ref 2.6–18.5)

## 2018-04-29 ENCOUNTER — Telehealth: Payer: Self-pay | Admitting: Medical Oncology

## 2018-04-29 NOTE — Telephone Encounter (Signed)
Pt asking about pulmonary referral. I told pt referral made and to expect call from Dr Lamonte Sakai office.

## 2018-04-30 ENCOUNTER — Encounter: Payer: Self-pay | Admitting: Adult Health

## 2018-04-30 ENCOUNTER — Inpatient Hospital Stay: Payer: Medicare Other | Attending: Internal Medicine

## 2018-04-30 ENCOUNTER — Ambulatory Visit: Payer: Medicare Other | Admitting: Adult Health

## 2018-04-30 DIAGNOSIS — Z79899 Other long term (current) drug therapy: Secondary | ICD-10-CM | POA: Insufficient documentation

## 2018-04-30 DIAGNOSIS — I481 Persistent atrial fibrillation: Secondary | ICD-10-CM | POA: Insufficient documentation

## 2018-04-30 DIAGNOSIS — K219 Gastro-esophageal reflux disease without esophagitis: Secondary | ICD-10-CM | POA: Insufficient documentation

## 2018-04-30 DIAGNOSIS — I5032 Chronic diastolic (congestive) heart failure: Secondary | ICD-10-CM | POA: Insufficient documentation

## 2018-04-30 DIAGNOSIS — J9611 Chronic respiratory failure with hypoxia: Secondary | ICD-10-CM

## 2018-04-30 DIAGNOSIS — C3491 Malignant neoplasm of unspecified part of right bronchus or lung: Secondary | ICD-10-CM | POA: Insufficient documentation

## 2018-04-30 DIAGNOSIS — Z9221 Personal history of antineoplastic chemotherapy: Secondary | ICD-10-CM | POA: Insufficient documentation

## 2018-04-30 DIAGNOSIS — D539 Nutritional anemia, unspecified: Secondary | ICD-10-CM

## 2018-04-30 DIAGNOSIS — J449 Chronic obstructive pulmonary disease, unspecified: Secondary | ICD-10-CM

## 2018-04-30 DIAGNOSIS — R0602 Shortness of breath: Secondary | ICD-10-CM | POA: Insufficient documentation

## 2018-04-30 DIAGNOSIS — Z7901 Long term (current) use of anticoagulants: Secondary | ICD-10-CM | POA: Insufficient documentation

## 2018-04-30 DIAGNOSIS — R5383 Other fatigue: Secondary | ICD-10-CM | POA: Insufficient documentation

## 2018-04-30 DIAGNOSIS — I130 Hypertensive heart and chronic kidney disease with heart failure and stage 1 through stage 4 chronic kidney disease, or unspecified chronic kidney disease: Secondary | ICD-10-CM | POA: Insufficient documentation

## 2018-04-30 DIAGNOSIS — Z9981 Dependence on supplemental oxygen: Secondary | ICD-10-CM | POA: Insufficient documentation

## 2018-04-30 DIAGNOSIS — D509 Iron deficiency anemia, unspecified: Secondary | ICD-10-CM | POA: Insufficient documentation

## 2018-04-30 NOTE — Patient Instructions (Addendum)
Continue on  ANORO 1 puff daily . Rinse after use.  Continue on Oxygen 2l/m .  I will be in touch regarding next step with Dr. Lamonte Sakai  .  Follow up with Dr. Lamonte Sakai  In 2 months and As needed   Please contact office for sooner follow up if symptoms do not improve or worsen or seek emergency care

## 2018-04-30 NOTE — Assessment & Plan Note (Signed)
Cont on O2 .  

## 2018-04-30 NOTE — Progress Notes (Signed)
@Patient  ID: Diane [name], female    DOB: 1940-12-31, 77 y.o.   MRN: [id number]  Chief Complaint  Patient presents with  . Follow-up    abn CT chest     Referring provider: [name], *  HPI: 77 year old female former smoker followed for COPD and non-small cell lung cancer (diagnosed 2008 with recurrence) Past medical history significant for A. fib, diastolic congestive heart failure-on Eliquis and amiodarone  TEST /EVENTS Non-small lung cancer diagnosed 2008, treated with chemotherapy and Tarceva CT chest January 21, 2018 showed chronic right-sided changes status post radiation treatment, stable small groundglass nodule left upper lobe disease 3 mm) and a new left upper lobe 4 mm nodule and unchanged 5 mm left lower lobe nodule and a small left pleural effusion.  Thoracentesis was attempted January 31, 2018 but unable to sample due to small size  PFT May 23, 2017 showed moderate COPD with FEV1 at 54%, ratio 72, FVC 56%, DLCO 43%, no significant bronchodilator response   04/30/2018 Follow up : COPD, abnormal CT chest, lung cancer, Patient presents for a follow-up of COPD.  She says overall breathing is been doing okay.  She denies any flare of cough or wheezing.  She remains on Anoro daily  Remains on 2llm of O2 . Doing well. No increased dyspnea.   Patient was recently seen with oncology as she has a history of non-small lung cancer Priestly treated with chemo and radiation.  He has been under surveillance for the last year. Most recent CT chest Apr 15, 2018 showed new right perihilar soft tissue opacity with new obstruction of the right lower lobe bronchi and postobstructive pneumonitis.  Chronic right sided changes trace left pleural effusion has decreased mediastinal lymph nodes appear minimally enlarged. No hemoptyiss or wt loss.        Allergies  Allergen Reactions  . Septra Ds [Sulfamethoxazole-Trimethoprim] Nausea Only    Immunization History    Administered Date(s) Administered  . HPV Quadrivalent 09/14/2017  . Influenza-Unspecified 08/10/2014, 08/30/2016  . Zoster Recombinat (Shingrix) 09/14/2017    Past Medical History:  Diagnosis Date  . Aortic atherosclerosis ([place]) 12/03/2017  . CAD (coronary artery disease), native coronary artery 12/03/2017   Noted on CT scan by calcification   . CHF (congestive heart failure) ([place])   . Chronic diastolic heart failure ([place]) 12/03/2017  . GERD (gastroesophageal reflux disease)   . Heart murmur   . History of blood transfusion    "related to chemo" (12/05/2017)  . Hypertension   . Hypertensive heart disease 12/03/2017  . Kidney disease, chronic, stage III (GFR 30-59 ml/min) ([place]) 12/03/2017  . Lung cancer ([place]) dx'd 02/2010  . Mitral valve prolapse   . Morbid obesity ([place]) 12/03/2017  . 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 Persistent atrial fibrillation ([place]) 12/03/2017  . Restrictive lung disease 05/23/2017    Tobacco History: Social History   Tobacco Use  Smoking Status Former Smoker  . Packs/day: 2.00  . Years: 40.00  . Pack years: 80.00  . Types: Cigarettes  . Last attempt to quit: 11/27/2001  . Years since quitting: 16.4  Smokeless Tobacco Never Used   Counseling given: Not Answered   Outpatient Encounter Medications as of 04/30/2018  Medication Sig  . amiodarone (PACERONE) 200 MG tablet Take 1 tablet (200 mg total) by mouth 2 (two) times daily.  Marland Kitchen apixaban (ELIQUIS) 5 MG TABS tablet Take 5 mg by mouth 2 (two) times daily.  . calcitRIOL (ROCALTROL) 0.25 MCG  capsule Take 0.25 mcg by mouth every Monday, Wednesday, and Friday.  . carvedilol (COREG) 25 MG tablet Take 25 mg by mouth 2 (two) times daily.  . ferrous sulfate 325 (65 FE) MG tablet Take 325 mg by mouth 3 (three) times daily with meals.  . hydrALAZINE (APRESOLINE) 25 MG tablet Take 25 mg by mouth 2 (two) times daily.  . hydroxypropyl methylcellulose / hypromellose (ISOPTO TEARS / GONIOVISC) 2.5 % ophthalmic solution  Place 1 drop into both eyes as needed for dry eyes.  . polyethylene glycol (MIRALAX / GLYCOLAX) packet Take 17 g by mouth daily as needed for mild constipation. Reported on 06/05/2016  . torsemide (DEMADEX) 20 MG tablet Take 1 tablet (20 mg total) by mouth daily.  Marland Kitchen umeclidinium-vilanterol (ANORO ELLIPTA) 62.5-25 MCG/INH AEPB Inhale 1 puff into the lungs daily.   Facility-Administered Encounter Medications as of 04/30/2018  Medication  . sodium chloride 0.9 % injection 10 mL     Review of Systems  Constitutional:   No  weight loss, night sweats,  Fevers, chills,  +fatigue, or  lassitude.  HEENT:   No headaches,  Difficulty swallowing,  Tooth/dental problems, or  Sore throat,                No sneezing, itching, ear ache, nasal congestion, post nasal drip,   CV:  No chest pain,  Orthopnea, PND, +swelling in lower extremities,  No anasarca, dizziness, palpitations, syncope.   GI  No heartburn, indigestion, abdominal pain, nausea, vomiting, diarrhea, change in bowel habits, loss of appetite, bloody stools.   Resp: .  No chest wall deformity  Skin: no rash or lesions.  GU: no dysuria, change in color of urine, no urgency or frequency.  No flank pain, no hematuria   MS:  No joint pain or swelling.  No decreased range of motion.  No back pain.    Physical Exam  BP (!) 148/88 (BP Location: Left Arm, Cuff Size: Large)   Pulse 89   Ht 5\' 5"  (1.651 m)   Wt 244 lb (110.7 kg)   SpO2 93%   BMI 40.60 kg/m   GEN: A/Ox3; pleasant , NAD, elderly on O2    HEENT:  [place]/AT,  EACs-clear, TMs-wnl, NOSE-clear, THROAT-clear, no lesions, no postnasal drip or exudate noted.   NECK:  Supple w/ fair ROM; no JVD; normal carotid impulses w/o bruits; no thyromegaly or nodules palpated; no lymphadenopathy.    RESP  Decreased BS in bases . no accessory muscle use, no dullness to percussion  CARD:  RRR, no m/r/g, tr peripheral edema, pulses intact, no cyanosis or clubbing.  GI:   Soft & nt; nml bowel  sounds; no organomegaly or masses detected.   Musco: Warm bil, no deformities or joint swelling noted.   Neuro: alert, no focal deficits noted.    Skin: Warm, no lesions or rashes    Lab Results:  BMET  BNP  ProBNP No results found for: PROBNP  Imaging: Ct Chest Wo Contrast  Result Date: 04/15/2018 CLINICAL DATA:  Right lung cancer, status post chemotherapy and radiation therapy. EXAM: CT CHEST WITHOUT CONTRAST TECHNIQUE: Multidetector CT imaging of the chest was performed following the standard protocol without IV contrast. COMPARISON:  01/21/2018. FINDINGS: Cardiovascular: Atherosclerotic calcification of the arterial vasculature, including three-vessel involvement of the coronary arteries. Heart is enlarged. No pericardial effusion. Left IJ Port-A-Cath terminates in the SVC. Mediastinum/Nodes: Low internal jugular lymph nodes measure up to 7 mm on the left, as before. Mediastinal lymph  nodes measure up to 10 mm in the prevascular space, previously 7 mm. Hilar regions are difficult to evaluate without IV contrast. No axillary adenopathy. There may be mild esophageal wall thickening. Lungs/Pleura: Centrilobular emphysema. Right hilar soft tissue with bronchiectasis and parenchymal retraction as well as new right lower lobe bronchial obstruction. Associated new airspace opacification in the right lower lobe. Complex moderate loculated right pleural fluid collection, grossly similar. Amorphous subpleural ground-glass in the right middle lobe (series 7, image 55), unchanged. Mosaic attenuation in the lungs. Trace left pleural fluid, decreased. Upper Abdomen: Visualized portions of the liver and gallbladder are unremarkable. Low-attenuation thickening of the right adrenal gland. Visualized portions of the left adrenal gland, left kidney, spleen, pancreas, stomach and bowel are grossly unremarkable. No upper abdominal adenopathy. Musculoskeletal: Degenerative changes in the spine. IMPRESSION: 1.  Right perihilar soft tissue with new obstruction of right lower lobe bronchi and postobstructive pneumonitis. Difficult to exclude disease recurrence, especially without IV contrast. 2. Right fibrothorax, unchanged. Trace left pleural effusion, decreased. 3. Mediastinal lymph nodes appear minimally enlarged. 4. Aortic atherosclerosis (ICD10-170.0). Three-vessel coronary artery calcification. 5.  Emphysema (ICD10-J43.9). Electronically Signed   By: Lorin Picket M.D.   On: 04/15/2018 15:20     Assessment & Plan:   No problem-specific Assessment & Plan notes found for this encounter.     Rexene Edison, NP 04/30/2018

## 2018-04-30 NOTE — Assessment & Plan Note (Addendum)
Lung cancer dx in 2008 - wt/ recurrence s/p radiation, Chemo off Tarceva for last year, under survelliance .  Most recent CT chest shows new area in right perihilar soft tissue with new obstruction of RLL /post obstructive pneumoniits .  Will discuss with Dr. Lamonte Sakai  Regarding proceeding with bronchscopy.  Case discussed with Dr. Lamonte Sakai  , will proceed with EBUS next week .  Cardiology eval to see if needs bridge for Eliquis vs off 2 days prior to procedure (on Eliquis for A Fib ) .

## 2018-04-30 NOTE — Assessment & Plan Note (Signed)
Controlled on rx   Plan  Patient Instructions  Continue on  ANORO 1 puff daily . Rinse after use.  Continue on Oxygen 2l/m .  I will be in touch regarding next step with Dr. Lamonte Sakai  .  Follow up with Dr. Lamonte Sakai  In 2 months and As needed   Please contact office for sooner follow up if symptoms do not improve or worsen or seek emergency care

## 2018-04-30 NOTE — H&P (View-Only) (Signed)
@Patient  ID: Diane [name], female    DOB: 03-29-1941, 77 y.o.   MRN: [id number]  Chief Complaint  Patient presents with  . Follow-up    abn CT chest     Referring provider: [name], *  HPI: 77 year old female former smoker followed for COPD and non-small cell lung cancer (diagnosed 2008 with recurrence) Past medical history significant for A. fib, diastolic congestive heart failure-on Eliquis and amiodarone  TEST /EVENTS Non-small lung cancer diagnosed 2008, treated with chemotherapy and Tarceva CT chest January 21, 2018 showed chronic right-sided changes status post radiation treatment, stable small groundglass nodule left upper lobe disease 3 mm) and a new left upper lobe 4 mm nodule and unchanged 5 mm left lower lobe nodule and a small left pleural effusion.  Thoracentesis was attempted January 31, 2018 but unable to sample due to small size  PFT May 23, 2017 showed moderate COPD with FEV1 at 54%, ratio 72, FVC 56%, DLCO 43%, no significant bronchodilator response   04/30/2018 Follow up : COPD, abnormal CT chest, lung cancer, Patient presents for a follow-up of COPD.  She says overall breathing is been doing okay.  She denies any flare of cough or wheezing.  She remains on Anoro daily  Remains on 2llm of O2 . Doing well. No increased dyspnea.   Patient was recently seen with oncology as she has a history of non-small lung cancer Priestly treated with chemo and radiation.  He has been under surveillance for the last year. Most recent CT chest Apr 15, 2018 showed new right perihilar soft tissue opacity with new obstruction of the right lower lobe bronchi and postobstructive pneumonitis.  Chronic right sided changes trace left pleural effusion has decreased mediastinal lymph nodes appear minimally enlarged. No hemoptyiss or wt loss.        Allergies  Allergen Reactions  . Septra Ds [Sulfamethoxazole-Trimethoprim] Nausea Only    Immunization History    Administered Date(s) Administered  . HPV Quadrivalent 09/14/2017  . Influenza-Unspecified 08/10/2014, 08/30/2016  . Zoster Recombinat (Shingrix) 09/14/2017    Past Medical History:  Diagnosis Date  . Aortic atherosclerosis ([place]) 12/03/2017  . CAD (coronary artery disease), native coronary artery 12/03/2017   Noted on CT scan by calcification   . CHF (congestive heart failure) ([place])   . Chronic diastolic heart failure ([place]) 12/03/2017  . GERD (gastroesophageal reflux disease)   . Heart murmur   . History of blood transfusion    "related to chemo" (12/05/2017)  . Hypertension   . Hypertensive heart disease 12/03/2017  . Kidney disease, chronic, stage III (GFR 30-59 ml/min) ([place]) 12/03/2017  . Lung cancer ([place]) dx'd 02/2010  . Mitral valve prolapse   . Morbid obesity ([place]) 12/03/2017  . 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 Persistent atrial fibrillation ([place]) 12/03/2017  . Restrictive lung disease 05/23/2017    Tobacco History: Social History   Tobacco Use  Smoking Status Former Smoker  . Packs/day: 2.00  . Years: 40.00  . Pack years: 80.00  . Types: Cigarettes  . Last attempt to quit: 11/27/2001  . Years since quitting: 16.4  Smokeless Tobacco Never Used   Counseling given: Not Answered   Outpatient Encounter Medications as of 04/30/2018  Medication Sig  . amiodarone (PACERONE) 200 MG tablet Take 1 tablet (200 mg total) by mouth 2 (two) times daily.  Marland Kitchen apixaban (ELIQUIS) 5 MG TABS tablet Take 5 mg by mouth 2 (two) times daily.  . calcitRIOL (ROCALTROL) 0.25 MCG  capsule Take 0.25 mcg by mouth every Monday, Wednesday, and Friday.  . carvedilol (COREG) 25 MG tablet Take 25 mg by mouth 2 (two) times daily.  . ferrous sulfate 325 (65 FE) MG tablet Take 325 mg by mouth 3 (three) times daily with meals.  . hydrALAZINE (APRESOLINE) 25 MG tablet Take 25 mg by mouth 2 (two) times daily.  . hydroxypropyl methylcellulose / hypromellose (ISOPTO TEARS / GONIOVISC) 2.5 % ophthalmic solution  Place 1 drop into both eyes as needed for dry eyes.  . polyethylene glycol (MIRALAX / GLYCOLAX) packet Take 17 g by mouth daily as needed for mild constipation. Reported on 06/05/2016  . torsemide (DEMADEX) 20 MG tablet Take 1 tablet (20 mg total) by mouth daily.  Marland Kitchen umeclidinium-vilanterol (ANORO ELLIPTA) 62.5-25 MCG/INH AEPB Inhale 1 puff into the lungs daily.   Facility-Administered Encounter Medications as of 04/30/2018  Medication  . sodium chloride 0.9 % injection 10 mL     Review of Systems  Constitutional:   No  weight loss, night sweats,  Fevers, chills,  +fatigue, or  lassitude.  HEENT:   No headaches,  Difficulty swallowing,  Tooth/dental problems, or  Sore throat,                No sneezing, itching, ear ache, nasal congestion, post nasal drip,   CV:  No chest pain,  Orthopnea, PND, +swelling in lower extremities,  No anasarca, dizziness, palpitations, syncope.   GI  No heartburn, indigestion, abdominal pain, nausea, vomiting, diarrhea, change in bowel habits, loss of appetite, bloody stools.   Resp: .  No chest wall deformity  Skin: no rash or lesions.  GU: no dysuria, change in color of urine, no urgency or frequency.  No flank pain, no hematuria   MS:  No joint pain or swelling.  No decreased range of motion.  No back pain.    Physical Exam  BP (!) 148/88 (BP Location: Left Arm, Cuff Size: Large)   Pulse 89   Ht 5\' 5"  (1.651 m)   Wt 244 lb (110.7 kg)   SpO2 93%   BMI 40.60 kg/m   GEN: A/Ox3; pleasant , NAD, elderly on O2    HEENT:  [place]/AT,  EACs-clear, TMs-wnl, NOSE-clear, THROAT-clear, no lesions, no postnasal drip or exudate noted.   NECK:  Supple w/ fair ROM; no JVD; normal carotid impulses w/o bruits; no thyromegaly or nodules palpated; no lymphadenopathy.    RESP  Decreased BS in bases . no accessory muscle use, no dullness to percussion  CARD:  RRR, no m/r/g, tr peripheral edema, pulses intact, no cyanosis or clubbing.  GI:   Soft & nt; nml bowel  sounds; no organomegaly or masses detected.   Musco: Warm bil, no deformities or joint swelling noted.   Neuro: alert, no focal deficits noted.    Skin: Warm, no lesions or rashes    Lab Results:  BMET  BNP  ProBNP No results found for: PROBNP  Imaging: Ct Chest Wo Contrast  Result Date: 04/15/2018 CLINICAL DATA:  Right lung cancer, status post chemotherapy and radiation therapy. EXAM: CT CHEST WITHOUT CONTRAST TECHNIQUE: Multidetector CT imaging of the chest was performed following the standard protocol without IV contrast. COMPARISON:  01/21/2018. FINDINGS: Cardiovascular: Atherosclerotic calcification of the arterial vasculature, including three-vessel involvement of the coronary arteries. Heart is enlarged. No pericardial effusion. Left IJ Port-A-Cath terminates in the SVC. Mediastinum/Nodes: Low internal jugular lymph nodes measure up to 7 mm on the left, as before. Mediastinal lymph  nodes measure up to 10 mm in the prevascular space, previously 7 mm. Hilar regions are difficult to evaluate without IV contrast. No axillary adenopathy. There may be mild esophageal wall thickening. Lungs/Pleura: Centrilobular emphysema. Right hilar soft tissue with bronchiectasis and parenchymal retraction as well as new right lower lobe bronchial obstruction. Associated new airspace opacification in the right lower lobe. Complex moderate loculated right pleural fluid collection, grossly similar. Amorphous subpleural ground-glass in the right middle lobe (series 7, image 55), unchanged. Mosaic attenuation in the lungs. Trace left pleural fluid, decreased. Upper Abdomen: Visualized portions of the liver and gallbladder are unremarkable. Low-attenuation thickening of the right adrenal gland. Visualized portions of the left adrenal gland, left kidney, spleen, pancreas, stomach and bowel are grossly unremarkable. No upper abdominal adenopathy. Musculoskeletal: Degenerative changes in the spine. IMPRESSION: 1.  Right perihilar soft tissue with new obstruction of right lower lobe bronchi and postobstructive pneumonitis. Difficult to exclude disease recurrence, especially without IV contrast. 2. Right fibrothorax, unchanged. Trace left pleural effusion, decreased. 3. Mediastinal lymph nodes appear minimally enlarged. 4. Aortic atherosclerosis (ICD10-170.0). Three-vessel coronary artery calcification. 5.  Emphysema (ICD10-J43.9). Electronically Signed   By: Lorin Picket M.D.   On: 04/15/2018 15:20     Assessment & Plan:   No problem-specific Assessment & Plan notes found for this encounter.     Rexene Edison, NP 04/30/2018

## 2018-05-01 ENCOUNTER — Telehealth: Payer: Self-pay | Admitting: Emergency Medicine

## 2018-05-01 NOTE — Addendum Note (Signed)
Addended by: Parke Poisson E on: 05/01/2018 01:21 PM   Modules accepted: Orders

## 2018-05-01 NOTE — Telephone Encounter (Signed)
Called spoke with patient, advised that TP discussed with RB and EBUS is to be scheduled for 6.12.19 (order has been placed).  Pt okay with this and voiced her understanding  Per TP: message will need to be sent to Dr Wynonia Lawman for advisement on pt's Eliquis regarding holding this medication vs a bridge  Pt aware we will be in contact regarding this medication  Routing message to TP and Dr Wynonia Lawman

## 2018-05-02 NOTE — Telephone Encounter (Signed)
Called patient, unable to reach left message to give [name] a call back.   Per [place] this has to be done at [facility] due to [place] doing procedures there on that day.   Will route to [place] as she states she will be calling patient.

## 2018-05-02 NOTE — Telephone Encounter (Signed)
Pt is having an endoscopy on 6/12 and [facility] called her and said that she was having it there she wanted it at [facility].  [phone]

## 2018-05-02 NOTE — Telephone Encounter (Signed)
I have spoken to pt she is aware she will need to [facility] 05/08/18 for a 11:00ebus/enb with dr byrum Diane [name]

## 2018-05-03 ENCOUNTER — Telehealth: Payer: Self-pay | Admitting: Emergency Medicine

## 2018-05-03 NOTE — Telephone Encounter (Signed)
Spoke with pt. She is aware of Dr. Agustina Caroli response. Nothing further was needed.

## 2018-05-03 NOTE — Telephone Encounter (Signed)
Spoke with pt. She is supposed to have a bronch with Dr. Lamonte Sakai on 05/08/18. Pt was supposed to be told when to stop Eliquis but she hasn't heard anything. She did take one dose this morning.  Dr. Lamonte Sakai - please advise. Thanks.

## 2018-05-03 NOTE — Telephone Encounter (Signed)
Needs to stop it 2 days before the test. Thanks

## 2018-05-06 ENCOUNTER — Encounter (HOSPITAL_COMMUNITY): Payer: Self-pay | Admitting: *Deleted

## 2018-05-06 NOTE — Progress Notes (Signed)
Eliquis last dose 05/05/2018; Dr. Wynonia Lawman cardiologist. Denies increased chest pain or shortness of breath. Patient reports that she is on home oxygen.

## 2018-05-07 NOTE — Progress Notes (Signed)
Anesthesia Chart Review: SAME DAY WORK-UP  Case:  [id number] Date/Time:  05/08/18 1045   Procedure:  VIDEO BRONCHOSCOPY WITH ENDOBRONCHIAL ULTRASOUND (Right )   Anesthesia type:  General   Pre-op diagnosis:  LUNG MASS   Location:  [facility] OR ROOM 10 / [facility]   Surgeon:  Collene Gobble, MD      DISCUSSION: Patient is a 77 year old female scheduled for the above procedure.  History includes former smoker, hypertension, a fib (s/p cardioversion 12/06/17), chronic diastolic CHF, CAD (coronary calcifications 12/03/17 CT), MVP, CKD stage III, restrictive lung disease, non-small cell lung cancer '08 with recurrence (s/p chemoradiation; right Pleurx catheter 12/02/07), home O2 2L, GERD, parotid gland adenocarcinoma '71, anemia.  She was seen by cardiology last month. Per Dr. Lamonte Sakai, patient to hold Eliquis for 2 days prior to surgery. She is a same day work-up. Anesthesiologist to review labs and evaluate patient prior to surgery--definitive anesthesia plan at that time.  PROVIDERS: [name], MD is listed as PCP  Ezzard Standing, MD is cardiologist. Last visit 04/16/18. She was maintaining SR on amiodarone following DCCV. He reduced her Coreg, but was hesitant to further reduce amiodarone due to risk of recurrent atrial fibrillation.  He plans to readdress at her next 44-month follow-up visit. Curt Bears, MD is HEM-ONC  LABS: She is for labs on the day of surgery. H/H 04/25/18 were 8.8/28.5. BUN/Cr 25/1.95 on 04/15/18 (Cr 1.68-3.26 since 11/2017; Cr ~ 1.8 09/2017).   IMAGES: CT Chest 04/15/18: IMPRESSION: 1. Right perihilar soft tissue with new obstruction of right lower lobe bronchi and postobstructive pneumonitis. Difficult to exclude disease recurrence, especially without IV contrast. 2. Right fibrothorax, unchanged. Trace left pleural effusion, decreased. 3. Mediastinal lymph nodes appear minimally enlarged. 4. Aortic atherosclerosis (ICD10-170.0). Three-vessel coronary artery  calcification. 5.  Emphysema (ICD10-J43.9).  PET scan 12/25/16: IMPRESSION: 1. Mild degradation secondary to patient body habitus. 2. Central right lower lobe masslike consolidation demonstrates moderate, relatively diffuse hypermetabolism. This pattern favors evolving radiation change. Residual/recurrent disease cannot be excluded. Potential clinical strategies include CT followup at 3 months or if there is a strong clinical concern of recurrent disease, bronchoscopy with attention to the right lower lobe. 3. No evidence of extrathoracic disease. 4. Chronic right-sided pleural effusion. 5.  Coronary artery atherosclerosis. Aortic atherosclerosis. 6.  Possible constipation.   OTHER: PFTs 05/23/17: moderate COPD with FEV1 at 54%, ratio 72, FVC 56%, DLCO 43%, no significant bronchodilator response   EKG: 02/12/18 (Dr. Wynonia Lawman): SR, right BBB   CV: Echo 11/26/17 (Dr. Wynonia Lawman): Conclusions: 1.  Mild concentric LVH with normal global wall motion.  Estimated EF 55%. 2.  Mild left atrial enlargement. 3.  Trace mitral regurgitation. 4.  Mild tricuspid regurgitation. 5.  IVC is dilated with a respiratory response of less than 50%.  Nuclear stress test 03/22/17 (Dr. Wynonia Lawman): Impression: 1.  Normal Lexiscan Myoview scan with no evidence of ischemia or infarction. 2.  Normal quantitative gated SPECT EF of 59% with normal wall motion and wall thickening. Recommendations: Low risk scan.  No evidence of ischemia noted.  Past Medical History:  Diagnosis Date  . Anemia   . Aortic atherosclerosis ([place]) 12/03/2017  . Atrial fibrillation ([place])   . CAD (coronary artery disease), native coronary artery 12/03/2017   Noted on CT scan by calcification   . CHF (congestive heart failure) ([place])   . Chronic diastolic heart failure ([place]) 12/03/2017  . Dyspnea   . GERD (gastroesophageal reflux disease)   . Heart murmur   .  History of blood transfusion    "related to chemo" (12/05/2017)  . Hypertension    . Hypertensive heart disease 12/03/2017  . Kidney disease, chronic, stage III (GFR 30-59 ml/min) ([place]) 12/03/2017  . Lung cancer ([place]) dx'd 02/2010  . Mitral valve prolapse   . Morbid obesity ([place]) 12/03/2017  . On home oxygen therapy   . 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 Persistent atrial fibrillation ([place]) 12/03/2017  . Restrictive lung disease 05/23/2017    Past Surgical History:  Procedure Laterality Date  . CARDIOVERSION N/A 12/06/2017   Procedure: CARDIOVERSION;  Surgeon: Jacolyn Reedy, MD;  Location: [facility] ENDOSCOPY;  Service: Cardiovascular;  Laterality: N/A;  . CATARACT EXTRACTION W/ INTRAOCULAR LENS  IMPLANT, BILATERAL Bilateral   . DERMABRASION  ~ 1966   "face; for acne"  . PAROTID GLAND TUMOR EXCISION Left   . PORTACATH PLACEMENT  11/03/08-[place]   tip in mid SVC-E. [place]  . THORACENTESIS Right   . TONSILLECTOMY      MEDICATIONS: No current facility-administered medications for this encounter.    Marland Kitchen amiodarone (PACERONE) 200 MG tablet  . apixaban (ELIQUIS) 5 MG TABS tablet  . calcitRIOL (ROCALTROL) 0.25 MCG capsule  . carvedilol (COREG) 25 MG tablet  . diphenhydrAMINE (ALLERGY RELIEF) 25 MG tablet  . ferrous sulfate 325 (65 FE) MG tablet  . hydrALAZINE (APRESOLINE) 25 MG tablet  . hydroxypropyl methylcellulose / hypromellose (ISOPTO TEARS / GONIOVISC) 2.5 % ophthalmic solution  . polyethylene glycol (MIRALAX / GLYCOLAX) packet  . torsemide (DEMADEX) 20 MG tablet  . umeclidinium-vilanterol (ANORO ELLIPTA) 62.5-25 MCG/INH AEPB   . sodium chloride 0.9 % injection 10 mL   George Hugh [facility] Short Stay Center/Anesthesiology Phone [phone] 05/07/2018 2:57 PM

## 2018-05-08 ENCOUNTER — Encounter (HOSPITAL_COMMUNITY)
Admission: RE | Disposition: A | Discharge status: Home / Self Care | Payer: Self-pay | Source: Ambulatory Visit | Attending: Emergency Medicine

## 2018-05-08 ENCOUNTER — Encounter (HOSPITAL_COMMUNITY): Payer: Self-pay

## 2018-05-08 ENCOUNTER — Ambulatory Visit (HOSPITAL_COMMUNITY): Payer: Medicare Other

## 2018-05-08 ENCOUNTER — Ambulatory Visit (HOSPITAL_COMMUNITY): Payer: Medicare Other | Admitting: Vascular Surgery

## 2018-05-08 ENCOUNTER — Other Ambulatory Visit: Payer: Self-pay

## 2018-05-08 ENCOUNTER — Ambulatory Visit (HOSPITAL_COMMUNITY)
Admission: RE | Admit: 2018-05-08 | Discharge: 2018-05-08 | Disposition: A | Discharge status: Home / Self Care | Payer: Medicare Other | Source: Ambulatory Visit | Attending: Emergency Medicine | Admitting: Emergency Medicine

## 2018-05-08 DIAGNOSIS — Z419 Encounter for procedure for purposes other than remedying health state, unspecified: Secondary | ICD-10-CM

## 2018-05-08 DIAGNOSIS — C3491 Malignant neoplasm of unspecified part of right bronchus or lung: Secondary | ICD-10-CM | POA: Diagnosis not present

## 2018-05-08 DIAGNOSIS — Z9889 Other specified postprocedural states: Secondary | ICD-10-CM | POA: Diagnosis not present

## 2018-05-08 DIAGNOSIS — Z85118 Personal history of other malignant neoplasm of bronchus and lung: Secondary | ICD-10-CM | POA: Insufficient documentation

## 2018-05-08 DIAGNOSIS — N183 Chronic kidney disease, stage 3 (moderate): Secondary | ICD-10-CM | POA: Diagnosis not present

## 2018-05-08 DIAGNOSIS — I2510 Atherosclerotic heart disease of native coronary artery without angina pectoris: Secondary | ICD-10-CM | POA: Insufficient documentation

## 2018-05-08 DIAGNOSIS — I481 Persistent atrial fibrillation: Secondary | ICD-10-CM | POA: Diagnosis not present

## 2018-05-08 DIAGNOSIS — Z79899 Other long term (current) drug therapy: Secondary | ICD-10-CM | POA: Diagnosis not present

## 2018-05-08 DIAGNOSIS — Z6841 Body Mass Index (BMI) 40.0 and over, adult: Secondary | ICD-10-CM | POA: Diagnosis not present

## 2018-05-08 DIAGNOSIS — J449 Chronic obstructive pulmonary disease, unspecified: Secondary | ICD-10-CM | POA: Insufficient documentation

## 2018-05-08 DIAGNOSIS — Z87891 Personal history of nicotine dependence: Secondary | ICD-10-CM | POA: Insufficient documentation

## 2018-05-08 DIAGNOSIS — I700 Atherosclerosis of aorta: Secondary | ICD-10-CM

## 2018-05-08 DIAGNOSIS — J849 Interstitial pulmonary disease, unspecified: Secondary | ICD-10-CM | POA: Insufficient documentation

## 2018-05-08 DIAGNOSIS — I130 Hypertensive heart and chronic kidney disease with heart failure and stage 1 through stage 4 chronic kidney disease, or unspecified chronic kidney disease: Secondary | ICD-10-CM | POA: Insufficient documentation

## 2018-05-08 DIAGNOSIS — R918 Other nonspecific abnormal finding of lung field: Secondary | ICD-10-CM | POA: Diagnosis present

## 2018-05-08 DIAGNOSIS — I5032 Chronic diastolic (congestive) heart failure: Secondary | ICD-10-CM | POA: Insufficient documentation

## 2018-05-08 HISTORY — DX: Anemia, unspecified: D64.9

## 2018-05-08 HISTORY — PX: VIDEO BRONCHOSCOPY WITH ENDOBRONCHIAL ULTRASOUND: SHX6177

## 2018-05-08 HISTORY — DX: Dependence on supplemental oxygen: Z99.81

## 2018-05-08 HISTORY — DX: Dyspnea, unspecified: R06.00

## 2018-05-08 LAB — APTT: aPTT: 27 seconds (ref 24–36)

## 2018-05-08 LAB — BASIC METABOLIC PANEL
Anion gap: 12 (ref 5–15)
BUN: 29 mg/dL — AB (ref 6–20)
CHLORIDE: 101 mmol/L (ref 101–111)
CO2: 26 mmol/L (ref 22–32)
CREATININE: 1.88 mg/dL — AB (ref 0.44–1.00)
Calcium: 9.3 mg/dL (ref 8.9–10.3)
GFR calc Af Amer: 29 mL/min — ABNORMAL LOW (ref >60)
GFR calc non Af Amer: 25 mL/min — ABNORMAL LOW (ref >60)
GLUCOSE: 97 mg/dL (ref 65–99)
Potassium: 4.9 mmol/L (ref 3.5–5.1)
Sodium: 139 mmol/L (ref 135–145)

## 2018-05-08 LAB — PROTIME-INR
INR: 1.07
Prothrombin Time: 13.8 seconds (ref 11.4–15.2)

## 2018-05-08 SURGERY — BRONCHOSCOPY, WITH EBUS
Anesthesia: General | Laterality: Right

## 2018-05-08 MED ORDER — LIDOCAINE 2% (20 MG/ML) 5 ML SYRINGE
INTRAMUSCULAR | Status: AC
  Filled 2018-05-08: qty 5

## 2018-05-08 MED ORDER — SUGAMMADEX SODIUM 200 MG/2ML IV SOLN
INTRAVENOUS | Status: AC
  Filled 2018-05-08: qty 2

## 2018-05-08 MED ORDER — SUGAMMADEX SODIUM 200 MG/2ML IV SOLN
INTRAVENOUS | Status: AC
Start: 2018-05-08 — End: ?
  Filled 2018-05-08: qty 2

## 2018-05-08 MED ORDER — ONDANSETRON HCL 4 MG/2ML IJ SOLN
INTRAMUSCULAR | Status: DC | PRN
  Administered 2018-05-08: 4 mg via INTRAVENOUS

## 2018-05-08 MED ORDER — SUGAMMADEX SODIUM 200 MG/2ML IV SOLN
INTRAVENOUS | Status: DC | PRN
  Administered 2018-05-08: 230 mg via INTRAVENOUS

## 2018-05-08 MED ORDER — TORSEMIDE 20 MG PO TABS: 40.0000 mg | ORAL_TABLET | Freq: Two times a day (BID) | ORAL | Status: DC

## 2018-05-08 MED ORDER — PROPOFOL 10 MG/ML IV BOLUS
INTRAVENOUS | Status: DC | PRN
  Administered 2018-05-08: 150 mg via INTRAVENOUS
  Administered 2018-05-08: 20 mg via INTRAVENOUS

## 2018-05-08 MED ORDER — APIXABAN 5 MG PO TABS: 5.0000 mg | ORAL_TABLET | Freq: Two times a day (BID) | ORAL | Status: DC

## 2018-05-08 MED ORDER — DEXAMETHASONE SODIUM PHOSPHATE 10 MG/ML IJ SOLN
INTRAMUSCULAR | Status: DC | PRN
  Administered 2018-05-08: 10 mg via INTRAVENOUS

## 2018-05-08 MED ORDER — FENTANYL CITRATE (PF) 100 MCG/2ML IJ SOLN: 25.0000 ug | INTRAMUSCULAR | Status: DC | PRN

## 2018-05-08 MED ORDER — FENTANYL CITRATE (PF) 250 MCG/5ML IJ SOLN
INTRAMUSCULAR | Status: DC | PRN
  Administered 2018-05-08: 100 ug via INTRAVENOUS

## 2018-05-08 MED ORDER — AMIODARONE HCL 200 MG PO TABS: 200.0000 mg | ORAL_TABLET | Freq: Every day | ORAL | Status: DC

## 2018-05-08 MED ORDER — ROCURONIUM BROMIDE 10 MG/ML (PF) SYRINGE
PREFILLED_SYRINGE | INTRAVENOUS | Status: DC | PRN
  Administered 2018-05-08: 10 mg via INTRAVENOUS
  Administered 2018-05-08: 40 mg via INTRAVENOUS

## 2018-05-08 MED ORDER — MIDAZOLAM HCL 2 MG/2ML IJ SOLN
INTRAMUSCULAR | Status: DC | PRN
  Administered 2018-05-08: 1 mg via INTRAVENOUS

## 2018-05-08 MED ORDER — SODIUM CHLORIDE 0.9 % IV SOLN
INTRAVENOUS | Status: DC
  Administered 2018-05-08 (×2): via INTRAVENOUS

## 2018-05-08 MED ORDER — LIDOCAINE 2% (20 MG/ML) 5 ML SYRINGE
INTRAMUSCULAR | Status: DC | PRN
  Administered 2018-05-08: 80 mg via INTRAVENOUS

## 2018-05-08 MED ORDER — MIDAZOLAM HCL 2 MG/2ML IJ SOLN
INTRAMUSCULAR | Status: AC
  Filled 2018-05-08: qty 2

## 2018-05-08 MED ORDER — 0.9 % SODIUM CHLORIDE (POUR BTL) OPTIME
TOPICAL | Status: DC | PRN
  Administered 2018-05-08: 1000 mL

## 2018-05-08 MED ORDER — FENTANYL CITRATE (PF) 250 MCG/5ML IJ SOLN
INTRAMUSCULAR | Status: AC
  Filled 2018-05-08: qty 5

## 2018-05-08 MED ORDER — PROPOFOL 10 MG/ML IV BOLUS
INTRAVENOUS | Status: AC
  Filled 2018-05-08: qty 20

## 2018-05-08 MED ORDER — DEXAMETHASONE SODIUM PHOSPHATE 10 MG/ML IJ SOLN
INTRAMUSCULAR | Status: AC
Start: 2018-05-08 — End: ?
  Filled 2018-05-08: qty 1

## 2018-05-08 MED ORDER — ONDANSETRON HCL 4 MG/2ML IJ SOLN
INTRAMUSCULAR | Status: AC
  Filled 2018-05-08: qty 2

## 2018-05-08 MED ORDER — ROCURONIUM BROMIDE 10 MG/ML (PF) SYRINGE
PREFILLED_SYRINGE | INTRAVENOUS | Status: AC
Start: 2018-05-08 — End: ?
  Filled 2018-05-08: qty 5

## 2018-05-08 MED ORDER — PHENYLEPHRINE 40 MCG/ML (10ML) SYRINGE FOR IV PUSH (FOR BLOOD PRESSURE SUPPORT)
PREFILLED_SYRINGE | INTRAVENOUS | Status: DC | PRN
  Administered 2018-05-08 (×2): 40 ug via INTRAVENOUS
  Administered 2018-05-08: 80 ug via INTRAVENOUS
  Administered 2018-05-08 (×4): 40 ug via INTRAVENOUS

## 2018-05-08 SURGICAL SUPPLY — 30 items
BRUSH CYTOL CELLEBRITY 1.5X140 (MISCELLANEOUS) ×3 IMPLANT
CANISTER SUCT 3000ML PPV (MISCELLANEOUS) ×3 IMPLANT
CONT SPEC 4OZ CLIKSEAL STRL BL (MISCELLANEOUS) ×3 IMPLANT
COVER BACK TABLE 60X90IN (DRAPES) ×3 IMPLANT
COVER DOME SNAP 22 D (MISCELLANEOUS) ×3 IMPLANT
FORCEPS BIOP RJ4 1.8 (CUTTING FORCEPS) ×3 IMPLANT
GAUZE SPONGE 4X4 12PLY STRL (GAUZE/BANDAGES/DRESSINGS) ×3 IMPLANT
GLOVE BIO SURGEON STRL SZ7.5 (GLOVE) ×3 IMPLANT
GLOVE BIOGEL PI IND STRL 6.5 (GLOVE) ×1 IMPLANT
GLOVE BIOGEL PI INDICATOR 6.5 (GLOVE) ×2
GOWN STRL REUS W/ TWL LRG LVL3 (GOWN DISPOSABLE) ×1 IMPLANT
GOWN STRL REUS W/TWL LRG LVL3 (GOWN DISPOSABLE) ×2
KIT CLEAN ENDO COMPLIANCE (KITS) ×6 IMPLANT
KIT TURNOVER KIT B (KITS) ×3 IMPLANT
MARKER SKIN DUAL TIP RULER LAB (MISCELLANEOUS) ×3 IMPLANT
NEEDLE EBUS SONO TIP PENTAX (NEEDLE) ×6 IMPLANT
NS IRRIG 1000ML POUR BTL (IV SOLUTION) ×3 IMPLANT
OIL SILICONE PENTAX (PARTS (SERVICE/REPAIRS)) ×3 IMPLANT
PAD ARMBOARD 7.5X6 YLW CONV (MISCELLANEOUS) ×6 IMPLANT
SYR 20CC LL (SYRINGE) ×6 IMPLANT
SYR 20ML ECCENTRIC (SYRINGE) ×6 IMPLANT
SYR 50ML SLIP (SYRINGE) ×3 IMPLANT
SYR 5ML LUER SLIP (SYRINGE) ×3 IMPLANT
TOWEL OR 17X24 6PK STRL BLUE (TOWEL DISPOSABLE) ×3 IMPLANT
TRAP SPECIMEN MUCOUS 40CC (MISCELLANEOUS) IMPLANT
TUBE CONNECTING 20'X1/4 (TUBING) ×2
TUBE CONNECTING 20X1/4 (TUBING) ×4 IMPLANT
UNDERPAD 30X30 (UNDERPADS AND DIAPERS) ×3 IMPLANT
VALVE DISPOSABLE (MISCELLANEOUS) ×3 IMPLANT
WATER STERILE IRR 1000ML POUR (IV SOLUTION) ×3 IMPLANT

## 2018-05-08 NOTE — Anesthesia Procedure Notes (Signed)
Procedure Name: Intubation Date/Time: 05/08/2018 11:34 AM Performed by: Raenette Rover, CRNA Pre-anesthesia Checklist: Patient identified, Emergency Drugs available, Suction available and Patient being monitored Patient Re-evaluated:Patient Re-evaluated prior to induction Oxygen Delivery Method: Circle system utilized Preoxygenation: Pre-oxygenation with 100% oxygen Induction Type: IV induction Ventilation: Mask ventilation without difficulty Laryngoscope Size: Mac and 3 Grade View: Grade I Tube type: Oral Tube size: 8.5 mm Number of attempts: 1 Airway Equipment and Method: Stylet Placement Confirmation: ETT inserted through vocal cords under direct vision,  positive ETCO2,  CO2 detector and breath sounds checked- equal and bilateral Secured at: 21 cm Tube secured with: Tape Dental Injury: Teeth and Oropharynx as per pre-operative assessment

## 2018-05-08 NOTE — Interval H&P Note (Signed)
PCCM Interval Note  77 year old woman is been treated for non-small cell lung cancer by Dr. [name].  She presents today for further evaluation of some increasing right hilar fullness and evolving right lower lobe bronchial obstruction concerning for possible cancer recurrence.  She has atrial fibrillation and is on Eliquis, she took her last dose on Sunday.  She does not report any new findings or issues.  There were no vitals filed for this visit.  Gen: Pleasant, overweight woman, well-nourished, in no distress,  normal affect  ENT: No lesions,  mouth clear, M2 airway,  oropharynx clear, no postnasal drip  Neck: No JVD, no stridor  Lungs: No use of accessory muscles, clear on the left, inspiratory rhonchi on the right  Cardiovascular: RRR, heart sounds normal, no murmur or gallops, trace lower extremity peripheral edema  Abdomen: soft and NT, no HSM,  BS normal  Musculoskeletal: No deformities, no cyanosis or clubbing  Neuro: alert, non focal  Skin: Warm, patchy erythema on her bilateral lower extremities, left upper extremity.  She states that these have been present since she initiated chemotherapy years ago.   Impression/plan: History of non-small cell lung cancer with evolving right hilar opacity, right lower lobe airway obstruction and postobstructive atelectasis.  Concerning for possible recurrence of her malignancy.  Plan will be for bronchoscopy with endobronchial ultrasound and biopsies if indicated.  She understands the risks, benefits and elects to proceed.  No barriers identified.  Baltazar Apo, MD, PhD 05/08/2018, 8:49 AM [facility] [phone] or if no answer [phone]

## 2018-05-08 NOTE — Transfer of Care (Signed)
Immediate Anesthesia Transfer of Care Note  Patient: Diane [name] [facility]  Procedure(s) Performed: VIDEO BRONCHOSCOPY WITH ENDOBRONCHIAL ULTRASOUND (Right )  Patient Location: PACU  Anesthesia Type:General  Level of Consciousness: awake, alert , oriented and patient cooperative  Airway & Oxygen Therapy: Patient Spontanous Breathing and Patient connected to face mask oxygen  Post-op Assessment: Report given to RN and Post -op Vital signs reviewed and stable  Post vital signs: Reviewed and stable  Last Vitals:  Vitals Value Taken Time  BP 154/77 05/08/2018  1:06 PM  Temp    Pulse 83 05/08/2018  1:09 PM  Resp 19 05/08/2018  1:09 PM  SpO2 100 % 05/08/2018  1:09 PM  Vitals shown include unvalidated device data.  Last Pain:  Vitals:   05/08/18 0902  TempSrc:   PainSc: 0-No pain         Complications: No apparent anesthesia complications

## 2018-05-08 NOTE — Anesthesia Preprocedure Evaluation (Signed)
Anesthesia Evaluation  Patient identified by MRN, date of birth, ID band Patient awake    Reviewed: Allergy & Precautions, H&P , NPO status , Patient's Chart, lab work & pertinent test results, reviewed documented beta blocker date and time   Airway Mallampati: III  TM Distance: >3 FB Neck ROM: Full    Dental no notable dental hx. (+) Teeth Intact, Dental Advisory Given   Pulmonary COPD, former smoker,    Pulmonary exam normal breath sounds clear to auscultation       Cardiovascular hypertension, Pt. on medications and Pt. on home beta blockers + CAD, + Peripheral Vascular Disease and +CHF  + dysrhythmias Atrial Fibrillation + Valvular Problems/Murmurs MVP  Rhythm:Regular Rate:Normal     Neuro/Psych negative neurological ROS  negative psych ROS   GI/Hepatic Neg liver ROS, GERD  ,  Endo/Other  negative endocrine ROS  Renal/GU Renal InsufficiencyRenal disease  negative genitourinary   Musculoskeletal   Abdominal   Peds  Hematology negative hematology ROS (+) anemia ,   Anesthesia Other Findings   Reproductive/Obstetrics negative OB ROS                             Anesthesia Physical Anesthesia Plan  ASA: III  Anesthesia Plan: General   Post-op Pain Management:    Induction: Intravenous  PONV Risk Score and Plan: 4 or greater and Ondansetron, Dexamethasone and Treatment may vary due to age or medical condition  Airway Management Planned: Oral ETT  Additional Equipment:   Intra-op Plan:   Post-operative Plan: Extubation in OR  Informed Consent: I have reviewed the patients History and Physical, chart, labs and discussed the procedure including the risks, benefits and alternatives for the proposed anesthesia with the patient or authorized representative who has indicated his/her understanding and acceptance.   Dental advisory given  Plan Discussed with: CRNA  Anesthesia Plan  Comments:         Anesthesia Quick Evaluation

## 2018-05-08 NOTE — Discharge Instructions (Signed)
Lung Cancer Screening A lung cancer screening is a test that checks for lung cancer. Lung cancer screening is done to look for lung cancer in its very early stages, before it spreads and becomes harder to treat and before symptoms appear. Finding cancer early improves the chances of successful treatment. It may save your life. Should I be screened for lung cancer? You should be screened for lung cancer if all of these apply:  You currently smoke or you have quit smoking within the past 15 years.  You are 26-63 years old. Screening may be recommended up to age 42 depending on your overall health and other factors.  You are in good general health.  You have a 30-pack-year smoking history.  To find your pack-year history, multiply how many packages of cigarettes you smoked each day by the number of years you smoked. For example, if you smoked two packs of cigarettes each day for 15 years, your pack-year history is 30. If you are not sure what your pack-year smoking history is, ask your health care provider. Screening may also be recommended if you are at high risk for the disease. You may be at high risk if:  You have a family history of lung cancer.  You have been exposed to asbestos.  You have chronic obstructive pulmonary disease (COPD).  You have a history of previous lung cancer.  How often should I be screened for lung cancer? If you are at risk for lung cancer, it is recommended that you are screened once a year. The recommended screening test is a low-dose CT scan. How can I lower my risk of lung cancer? To lower your risk of developing lung cancer:  If you smoke, stop smoking all tobacco products.  Avoid secondhand smoke.  Avoid exposure to radiation.  Avoid exposure to radon gas. Have your home checked for radon regularly.  Avoid things that cause cancer (carcinogens).  Avoid living or working in places with high air pollution.  Where to find more information: Ask  your health care provider about the risks and benefits of screening. More information and resources are available from these organizations:  [place] (ACS): www.cancer.org  American Lung Association: www.lung.org  Contact a health care provider if:  You start to show symptoms of lung cancer, including: ? Coughing that will not go away. ? Wheezing. ? Chest pain. ? Coughing up blood. ? Shortness of breath. ? Weight loss that cannot be explained. ? Constant fatigue. Summary  Lung cancer screening may find lung cancer before symptoms appear. Finding cancer early improves the chances of successful treatment. It may save your life.  If you are at risk for lung cancer, it is recommended that you are screened once a year. The recommended screening test is a low-dose CT scan.  You can make lifestyle changes to lower your risk of lung cancer.  Ask your health care provider about the risks and benefits of screening. This information is not intended to replace advice given to you by your health care provider. Make sure you discuss any questions you have with your health care provider. Document Released: 10/04/2016 Document Revised: 10/04/2016 Document Reviewed: 10/04/2016 Elsevier Interactive Patient Education  2018 [place] Screening A lung cancer screening is a test that checks for lung cancer. Lung cancer screening is done to look for lung cancer in its very early stages, before it spreads and becomes harder to treat and before symptoms appear. Finding cancer early  improves the chances of successful treatment. It may save your life. Should I be screened for lung cancer? You should be screened for lung cancer if all of these apply:  You currently smoke or you have quit smoking within the past 15 years.  You are 36-79 years old. Screening may be recommended up to age 92 depending on your overall health and other factors.  You are in good general  health.  You have a 30-pack-year smoking history.  To find your pack-year history, multiply how many packages of cigarettes you smoked each day by the number of years you smoked. For example, if you smoked two packs of cigarettes each day for 15 years, your pack-year history is 30. If you are not sure what your pack-year smoking history is, ask your health care provider. Screening may also be recommended if you are at high risk for the disease. You may be at high risk if:  You have a family history of lung cancer.  You have been exposed to asbestos.  You have chronic obstructive pulmonary disease (COPD).  You have a history of previous lung cancer.  How often should I be screened for lung cancer? If you are at risk for lung cancer, it is recommended that you are screened once a year. The recommended screening test is a low-dose CT scan. How can I lower my risk of lung cancer? To lower your risk of developing lung cancer:  If you smoke, stop smoking all tobacco products.  Avoid secondhand smoke.  Avoid exposure to radiation.  Avoid exposure to radon gas. Have your home checked for radon regularly.  Avoid things that cause cancer (carcinogens).  Avoid living or working in places with high air pollution.  Where to find more information: Ask your health care provider about the risks and benefits of screening. More information and resources are available from these organizations:  [place] (ACS): www.cancer.org  American Lung Association: www.lung.org  Contact a health care provider if:  You start to show symptoms of lung cancer, including: ? Coughing that will not go away. ? Wheezing. ? Chest pain. ? Coughing up blood. ? Shortness of breath. ? Weight loss that cannot be explained. ? Constant fatigue. Summary  Lung cancer screening may find lung cancer before symptoms appear. Finding cancer early improves the chances of successful treatment. It may save  your life.  If you are at risk for lung cancer, it is recommended that you are screened once a year. The recommended screening test is a low-dose CT scan.  You can make lifestyle changes to lower your risk of lung cancer.  Ask your health care provider about the risks and benefits of screening. This information is not intended to replace advice given to you by your health care provider. Make sure you discuss any questions you have with your health care provider. Document Released: 10/04/2016 Document Revised: 10/04/2016 Document Reviewed: 10/04/2016 Elsevier Interactive Patient Education  2018 [place] Biopsy A lung biopsy is a procedure to remove a tissue sample from the lung. The tissue can be examined under a microscope to help diagnose various lung disorders. There are three types of lung biopsies:  Needle biopsy. In this type, the sample is removed with a needle.  Bronchoscopy. In this type, the sample is removed through a flexible tube inserted into the lungs (bronchoscope).  Open biopsy. In this type, the sample is removed through an incision made in the chest.  Tell a health care  provider about:  Any allergies you have.  All medicines you are taking, including vitamins, herbs, eye drops, creams, and over-the-counter medicines.  Any problems you or family members have had with anesthetic medicines.  Any blood disorders or bleeding problems that you have.  Any surgeries you have had.  Any medical conditions you have.  Whether you are pregnant or may be pregnant. What are the risks? Generally, this is a safe procedure. However, problems may occur, including:  Collapsed lung.  Bleeding.  Infection.  Pain.  What happens before the procedure? Staying hydrated Follow instructions from your health care provider about hydration, which may include:  Up to 2 hours before the procedure - you may continue to drink clear liquids, such as water, clear fruit  juice, black coffee, and plain tea.  Eating and drinking restrictions Follow instructions from your health care provider about eating and drinking, which may include:  8 hours before the procedure - stop eating heavy meals or foods such as meat, fried foods, or fatty foods.  6 hours before the procedure - stop eating light meals or foods, such as toast or cereal.  6 hours before the procedure - stop drinking milk or drinks that contain milk.  2 hours before the procedure - stop drinking clear liquids.  General instructions  Ask your health care provider about: ? Changing or stopping your regular medicines. This is especially important if you take diabetes medicines or blood thinners. ? Taking medicines such as aspirin and ibuprofen. These medicines can thin your blood. Do not take these medicines before your procedure if your health care provider instructs you not to.  Plan to have someone take you home from the hospital or clinic. What happens during the procedure?  To lower your risk of infection: ? Your health care team will wash or sanitize their hands. ? If you are having an open biopsy, your skin will be washed with soap.  An IV tube may be inserted into one of your veins.  You may be given one or both of the following: ? A medicine to help you relax (sedative) during the procedure. ? A medicine to numb the area where the biopsy sample will be taken (local anesthetic). ? A medicine to make you sleep through the procedure (general anesthetic).  If you have a needle biopsy: ? A biopsy needle will be inserted into your lung. A CT scanner may be used to guide the needle to the right place. ? The needle will be used to collect the tissue sample.  If you have bronchoscopy: ? A bronchoscope will be inserted into your lungs through your mouth or nose. ? A needle or forceps will be passed through the bronchoscope to remove the tissue sample.  If you have an open biopsy: ? An  incision will be made in your chest. ? The tissue sample will be removed using surgical tools. ? The incision will be closed with skin glue, skin adhesive strips, or stitches (sutures). The procedure may vary among health care providers and hospitals. What happens after the procedure?  Your blood pressure, heart rate, breathing rate, and blood oxygen level will be monitored until the medicines you were given have worn off.  If a needle biopsy was performed, a bandage (dressing) will be applied over the area where the needle was inserted. You may be asked to apply pressure to the bandage for several minutes to ensure there is minimal bleeding.  If a bronchoscope was used, you may  have a cough and some soreness in your throat.  Do not drive for 24 hours if you were given a sedative. Summary  A lung biopsy is a procedure to remove a tissue sample from your lung. The sample is examined to help diagnose various lung disorders.  A lung biopsy may be done using a needle, by inserting a tube into the lungs, or through an incision in the chest.  After your lung biopsy, you may have a cough and some throat soreness. This information is not intended to replace advice given to you by your health care provider. Make sure you discuss any questions you have with your health care provider. Document Released: 02/01/2005 Document Revised: 10/04/2016 Document Reviewed: 10/04/2016 Elsevier Interactive Patient Education  2017 [place] (CEA) is a protein that is elevated in your blood if you have some types of cancer. This protein is normally found in babies, but it decreases after birth. Adults typically have a very low level of CEA. CEA is measured with a blood test. It requires a blood sample from a vein in your arm or hand. Your health care provider may order this blood test if you have been diagnosed with cancer. This test can be used to monitor your cancer before,  during, and after treatment. Conditions that can be monitored with CEA include cancer of the:  Colon.  Breast.  Lungs.  Ovaries.  Pancreas.  Stomach.  Bladder.  Thyroid.  Liver.  CEA is not used to screen for cancer because certain noncancerous conditions can also increase CEA. These include breast, lung, and digestive system diseases. How do I prepare for this test? Let your health care provider know if you use any tobacco products, including cigarettes, chewing tobacco, or electronic cigarettes, as tobacco can make CEA go up. Your health care provider may ask you to stop using tobacco for a few days before the test. If you need help quitting, ask your health care provider. What do the results mean? It is your responsibility to obtain your test results. Ask the lab or department performing the test when and how you will get your results. Contact your health care provider to discuss any questions you have about your results. Range of Normal Values Ranges for normal values vary among different labs and hospitals. You should always check with your health care provider after having lab work or other tests done to discuss whether your values are considered within normal limits. The normal range for CEA is 0-2.5 micrograms per liter (mcg/L). If you use tobacco, a normal range can be up to 5 mcg/L. Higher levels are in the positive range. The higher the level, the more significant the result is. Meaning of Results Outside Normal Range Values If you have cancer, a high CEA level may mean that your cancer:  Is advanced.  Is not responding to treatment.  Has come back after treatment.  Other conditions may also cause a high CEA level. These include:  Heavy tobacco use.  Liver disease.  Certain bowel diseases.  Inflammation of your pancreas.  Lung disease or infection.  Stomach ulcer.  Breast disease that is not cancer.  Ovarian disease that is not cancer.  Talk with  your health care provider to discuss your results, treatment options, and if necessary, the need for more tests. Talk with your health care provider if you have any questions about your results. This information is not intended to replace advice given to you by your  health care provider. Make sure you discuss any questions you have with your health care provider. Document Released: 12/07/2004 Document Revised: 07/18/2016 Document Reviewed: 02/11/2014 Elsevier Interactive Patient Education  2018 [employer].   Chemotherapy Chemotherapy is the use of medicines to stop or slow the growth of cancer cells. Depending on the type and stage of your cancer, you may have chemotherapy to:  Cure your cancer.  Slow the progression of your cancer.  Ease your cancer symptoms.  Improve the benefits of radiation treatment.  Shrink a tumor before surgery.  Rid the body of cancer cells that remain after a tumor is surgically removed.  How is chemotherapy given? Chemotherapy may be given:  By mouth in liquid or pill form.  Through a thin tube that is inserted into a vein or artery.  By getting a shot.  By rubbing a cream or ointment on your skin.  Through liquids that are placed directly into various areas of the body, such as the abdomen, chest, or bladder.  How often is chemotherapy given? Chemotherapy may be given continuously over time, or it may be given in cycles. For example, you may take the medicine for one week out of every month. For how long will I need chemotherapy treatments? The length of treatment depends on many factors, including:  The type of cancer.  Whether the cancer has spread.  How you respond to the chemotherapy.  Whether you develop side effects.  Some types of chemotherapy medicine are given only one time. Others are given for months, years, or for life. What safety precautions must I take while on chemotherapy? Chemotherapy medicines are very strong. They  will be in all of your bodily fluids, including your urine, stool, saliva, sweat, tears, vaginal secretions, and semen. You must carefully follow some safety precautions to prevent harm to others while you are using these medicines. Here are some recommended precautions:  Make sure that people who help care for you wear disposable gloves if they are going to come into contact with any of your bodily fluids. Women who are pregnant or breastfeeding should not handle any of your bodily fluids.  Wash any clothes, towels, and linens that may have your bodily fluids on them twice in a washing machine using very hot water.  Dispose of adult diapers, tampons, and sanitary napkins by first sealing them in a plastic bag.  Use a condom when having sex for at least 2 weeks after receiving your chemotherapy.  Do not share beverages or food.  Keep your chemotherapy medicines in their original bottles. Keep them in a high, safe location, away from children. Do not expose them to heat or moisture. Do not put them in containers with other types of medicines.  Dispose of all wrappers for your chemotherapy medicines by sealing them in a separate plastic bag.  Do not throw away extra medicine, and do not flush it down the toilet. Take medicine that you are not going to use to your health care provider's office where it can be disposed of properly.  Follow your health care providers directions for the proper disposal of needles, IV tubing, and other medical supplies that have come into contact with your chemotherapy medicines.  If you are issued a hazardous waste container, make sure you understand the directions for using it.  Wash your hands thoroughly with warm water and soap after using the bathroom. Dry your hands with disposable paper towels.  When using the toilet: ? Flush it twice  after each use, including after vomiting. ? Close the lid of the toilet prior to flushing. This helps to avoid  splashing. ? Both men and women should sit to use the toilet. This helps avoid splashing.  What are the side effects of chemotherapy? Side effects depend on a variety of factors, including:  The specific type of chemotherapy medicine used.  The dosage.  How long the medicine is used for.  Your overall health.  Some of the side effects you may experience include:  Fatigue and decreased energy.  Decreased appetite.  Changes in your sense of smell or taste.  Nausea.  Vomiting.  Constipation or diarrhea.  Hair loss.  Increased susceptibility to infection.  Easy bleeding.  Mouth sores.  Burning or tingling in the hands or feet.  Memory problems.  This information is not intended to replace advice given to you by your health care provider. Make sure you discuss any questions you have with your health care provider. Document Released: 09/10/2007 Document Revised: 06/02/2016 Document Reviewed: 04/21/2014 Elsevier Interactive Patient Education  2018 [place] Lung cancer occurs when abnormal cells in the lung grow out of control and form a mass (tumor). There are several types of lung cancer. The two most common types are:  Non-small cell. In this type of lung cancer, abnormal cells are larger and grow more slowly than those of small cell lung cancer.  Small cell. In this type of lung cancer, abnormal cells are smaller than those of non-small cell lung cancer. Small cell lung cancer gets worse faster than non-small cell lung cancer.  What are the causes? The leading cause of lung cancer is smoking tobacco. The second leading cause is radon exposure. What increases the risk?  Smoking tobacco.  Exposure to secondhand tobacco smoke.  Exposure to radon gas.  Exposure to asbestos.  Exposure to arsenic in drinking water.  Air pollution.  Family or personal history of lung cancer.  Lung radiation therapy.  Being older than 19 years. What are  the signs or symptoms? In the early stages, symptoms may not be present. As the cancer progresses, symptoms may include:  A lasting cough, possibly with blood.  Fatigue.  Unexplained weight loss.  Shortness of breath.  Wheezing.  Chest pain.  Loss of appetite.  Symptoms of advanced lung cancer include:  Hoarseness.  Bone or joint pain.  Weakness.  Nail problems.  Face or arm swelling.  Paralysis of the face.  Drooping eyelids.  How is this diagnosed? Lung cancer can be identified with a physical exam and with tests such as:  A chest X-ray.  A CT scan.  Blood tests.  A biopsy.  After a diagnosis is made, you will have more tests to determine the stage of the cancer. The stages of non-small cell lung cancer are:  Stage 0, also called carcinoma in situ. At this stage, abnormal cells are found in the inner lining of your lung or lungs.  Stage I. At this stage, abnormal cells have grown into a tumor that is no larger than 5 cm across. The cancer has entered the deeper lung tissue but has not yet entered the lymph nodes or other parts of the body.  Stage II. At this stage, the tumor is 7 cm across or smaller and has entered nearby lymph nodes. Or, the tumor is 5 cm across or smaller and has invaded surrounding tissue but is not found in nearby lymph nodes. There may be more  than one tumor present.  Stage III. At this stage, the tumor may be any size. There may be more than one tumor in the lungs. The cancer cells have spread to the lymph nodes and possibly to other organs.  Stage IV. At this stage, there are tumors in both lungs and the cancer has spread to other areas of the body.  The stages of small cell lung cancer are:  Limited. At this stage, the cancer is found only on one side of the chest.  Extensive. At this stage, the cancer is in the lungs and in tissues on the other side of the chest. The cancer has spread to other organs or is found in the fluid  between the layers of your lungs.  How is this treated? Depending on the type and stage of your lung cancer, you may be treated with:  Surgery. This is done to remove a tumor.  Radiation therapy. This treatment destroys cancer cells using X-rays or other types of radiation.  Chemotherapy. This treatment uses medicines to destroy cancer cells.  Targeted therapy. This treatment aims to destroy only cancer cells instead of all cells as other therapies do.  You may also have a combination of treatments. Follow these instructions at home:  Do not use any tobacco products. This includes cigarettes, chewing tobacco, and electronic cigarettes. If you need help quitting, ask your health care provider.  Take medicines only as directed by your health care provider.  Eat a healthy diet. Work with a dietitian to make sure you are getting the nutrition you need.  Consider joining a support group or seeking counseling to help you cope with the stress of having lung cancer.  Let your cancer specialist (oncologist) know if you are admitted to the hospital.  Keep all follow-up visits as directed by your health care provider. This is important. Contact a health care provider if:  You lose weight without trying.  You have a persistent cough and wheezing.  You feel short of breath.  You tire easily.  You experience bone or joint pain.  You have difficulty swallowing.  You feel hoarse or notice your voice changing.  Your pain medicine is not helping. Get help right away if:  You cough up blood.  You have new breathing problems.  You develop chest pain.  You develop swelling in: ? One or both ankles or legs. ? Your face, neck, or arms.  You are confused.  You experience paralysis in your face or a drooping eyelid. This information is not intended to replace advice given to you by your health care provider. Make sure you discuss any questions you have with your health care  provider. Document Released: 02/19/2001 Document Revised: 04/20/2016 Document Reviewed: 03/19/2014 Elsevier Interactive Patient Education  2018 [place].  Lung Resection, Care After Refer to this sheet in the next few weeks. These instructions provide you with information on caring for yourself after your procedure. Your health care provider may also give you more specific instructions. Your treatment has been planned according to current medical practices, but problems sometimes occur. Call your health care provider if you have any problems or questions after your procedure. What can I expect after the procedure? After your procedure, it is typical to have the following:  You may feel pain in your chest and throat.  Patients may sometimes shiver or feel nauseous during recovery.  Follow these instructions at home:  You may resume a normal diet and activities as directed  by your health care provider.  Do not use any tobacco products, including cigarettes, chewing tobacco, or electronic cigarettes. If you need help quitting, ask your health care provider.  There are many different ways to close and cover an incision, including stitches, skin glue, and adhesive strips. Follow your health care provider's instructions on: ? Incision care. ? Bandage (dressing) changes and removal. ? Incision closure removal.  Take medicines only as directed by your health care provider.  Keep all follow-up visits as directed by your health care provider. This is important.  Try to breathe deeply and cough as directed. Holding a pillow firmly over your ribs may help with discomfort.  If you were given an incentive spirometer in the hospital, continue to use it as directed by your health care provider.  Walk as directed by your health care provider.  You may take a shower and gently wash the area of your incision with water and soap as directed by your health care provider. Do not use anything else to  clean your incision except as directed by your health care provider. Do not take baths, swim, or use a hot tub until your health care provider approves. Contact a health care provider if:  You notice redness, swelling, or increasing pain at the incision site.  You are bleeding at the incision site.  You see pus coming from the incision site.  You notice a bad smell coming from the incision site or bandage.  Your incision breaks open.  You cough up blood or pus, or you develop a cough that produces bad-smelling sputum.  You have pain or swelling in your legs.  You have increasing pain that is not controlled with medicine.  You have trouble managing any of the tubes that have been left in place after surgery.  You have fever or chills. Get help right away if:  You have chest pain or an irregular or rapid heartbeat.  You have dizzy episodes or faint.  You have shortness of breath or difficulty breathing.  You have persistent nausea or vomiting.  You have a rash. This information is not intended to replace advice given to you by your health care provider. Make sure you discuss any questions you have with your health care provider. Document Released: 06/02/2005 Document Revised: 04/20/2016 Document Reviewed: 01/02/2014 Elsevier Interactive Patient Education  2018 [employer]. Flexible Bronchoscopy, Care After These instructions give you information on caring for yourself after your procedure. Your doctor may also give you more specific instructions. Call your doctor if you have any problems or questions after your procedure. Follow these instructions at home:  Do not eat or drink anything for 2 hours after your procedure. If you try to eat or drink before the medicine wears off, food or drink could go into your lungs. You could also burn yourself.  After 2 hours have passed and when you can cough and gag normally, you may eat soft food and drink liquids slowly.  The day after  the test, you may eat your normal diet.  You may do your normal activities.  Keep all doctor visits. Get help right away if:  You get more and more short of breath.  You get light-headed.  You feel like you are going to pass out (faint).  You have chest pain.  You have new problems that worry you.  You cough up more than a little blood.  You cough up more blood than before.  You may restart your Eliquis  on Friday 05/10/18.   Please call our office for any questions or concerns.  [phone].  This information is not intended to replace advice given to you by your health care provider. Make sure you discuss any questions you have with your health care provider. Document Released: 09/10/2009 Document Revised: 04/20/2016 Document Reviewed: 07/18/2013 Elsevier Interactive Patient Education  2017 [employer].

## 2018-05-08 NOTE — Op Note (Signed)
Video Bronchoscopy with Endobronchial Ultrasound Procedure Note  Date of Operation: 05/08/2018  Pre-op Diagnosis: Right hilar mass, right lower lobe opacity  Post-op Diagnosis: Same  Surgeon: Baltazar Apo  Assistants: None  Anesthesia: General endotracheal anesthesia  Operation: Flexible video fiberoptic bronchoscopy with endobronchial ultrasound and biopsies.  Estimated Blood Loss: 20 cc  Complications: None apparent  Indications and History: Diane [name] is a 77 y.o. female with history of non-small cell lung cancer.  Her surveillance imaging was concerning for an evolving right hilar mass with right lower lobe airway occlusion and postobstructive opacity.  Recommendation was made to achieve a tissue diagnosis via bronchoscopy with endobronchial ultrasound.  The risks, benefits, complications, treatment options and expected outcomes were discussed with the patient.  The possibilities of pneumothorax, pneumonia, reaction to medication, pulmonary aspiration, perforation of a viscus, bleeding, failure to diagnose a condition and creating a complication requiring transfusion or operation were discussed with the patient who freely signed the consent.    Description of Procedure: The patient was examined in the preoperative area and history and data from the preprocedure consultation were reviewed. It was deemed appropriate to proceed.  The patient was taken to OR 10, identified as Diane [name] and the procedure verified as Flexible Video Fiberoptic Bronchoscopy.  A Time Out was held and the above information confirmed. After being taken to the operating room general anesthesia was initiated and the patient  was orally intubated. The video fiberoptic bronchoscope was introduced via the endotracheal tube and a general inspection was performed which showed normal left-sided airways.  The right middle lobe airway was somewhat narrowed but the scope was able to pass and the segmental airways  were all normal in appearance there were no endobronchial lesions present.  The right lower lobe airways were somewhat thickened and friable but without an overt mass seen.  Endobronchial and transbronchial right lower lobe brushings and biopsies were performed.  Neither the biopsy forceps nor the cytology brush would pass through the superior segment airways distally, concerning for proximal occlusion that could not be visualized with the scope itself.  Some of the endobronchial biopsies were taken at this location. The standard scope was then withdrawn and the endobronchial ultrasound was used to identify and characterize the peritracheal, hilar and bronchial lymph nodes. Inspection showed opacities in the right hilum principally around the right upper lobe takeoff.  This is in the region of the 10 R and 11 R nodes. Using real-time ultrasound guidance Wang needle biopsies were take from the right hilum at the opacities in the vicinity of the Station 10 R and 11 R nodes and were sent for cytology. The patient tolerated the procedure well without apparent complications. There was no significant blood loss. The bronchoscope was withdrawn. Anesthesia was reversed and the patient was taken to the PACU for recovery.   Samples: 1. Wang needle biopsies from 10 R node 2. Wang needle biopsies from 11 R node 3.  Endobronchial biopsies right lower lobe 4.  Endobronchial brushings right lower lobe 5.  Transbronchial biopsies right lower lobe 6.  Transbronchial brushings right lower lobe  Plans:  The patient will be discharged from the PACU to home when recovered from anesthesia. We will review the cytology, pathology and microbiology results with the patient when they become available. Outpatient followup will be with Dr Lamonte Sakai and Dr [name].    Baltazar Apo, MD, PhD 05/08/2018, 1:02 PM [facility] [phone] or if no answer [phone]

## 2018-05-09 ENCOUNTER — Encounter (HOSPITAL_COMMUNITY): Payer: Self-pay | Admitting: Emergency Medicine

## 2018-05-09 NOTE — Anesthesia Postprocedure Evaluation (Signed)
Anesthesia Post Note  Patient: Diane [name] [facility]  Procedure(s) Performed: VIDEO BRONCHOSCOPY WITH ENDOBRONCHIAL ULTRASOUND (Right )     Patient location during evaluation: PACU Anesthesia Type: General Level of consciousness: awake and alert Pain management: pain level controlled Vital Signs Assessment: post-procedure vital signs reviewed and stable Respiratory status: spontaneous breathing, nonlabored ventilation, respiratory function stable and patient connected to nasal cannula oxygen Cardiovascular status: blood pressure returned to baseline and stable Postop Assessment: no apparent nausea or vomiting Anesthetic complications: no    Last Vitals:  Vitals:   05/08/18 1325 05/08/18 1337  BP: (!) 148/70 (!) 157/59  Pulse: 84 83  Resp: (!) 24 (!) 84  Temp: (!) 36.3 C   SpO2: 95% 93%    Last Pain:  Vitals:   05/08/18 1337  TempSrc:   PainSc: 0-No pain                 Luisantonio Adinolfi,W. EDMOND

## 2018-05-13 ENCOUNTER — Telehealth: Payer: Self-pay | Admitting: Emergency Medicine

## 2018-05-13 NOTE — Telephone Encounter (Signed)
I reviewed the bronchoscopy results with her today.  Her endobronchial biopsies, transbronchial biopsies, endobronchial and transbronchial brushings are all normal.  I also sampled irregularly-shaped soft tissue in the areas of the 10 R and 11 R lymph nodes.  These were normal.  No evidence on pathology of recurrent malignancy.  This is reassuring although I told her that it is possible that there is a slow-growing process that we did not detect.  She has follow-up planned with Dr. [name] next week and I suspect that we will do surveillance until her next CT scan of the chest.  If there is any indication to suggest that she needs repeat biopsy then we can plan a strategy at that time.  I will forward to Dr Edwena Blow

## 2018-05-13 NOTE — Telephone Encounter (Signed)
Thank you :)

## 2018-05-20 ENCOUNTER — Telehealth: Payer: Self-pay

## 2018-05-20 ENCOUNTER — Inpatient Hospital Stay: Payer: Medicare Other | Admitting: Internal Medicine

## 2018-05-20 ENCOUNTER — Inpatient Hospital Stay: Payer: Medicare Other

## 2018-05-20 ENCOUNTER — Encounter: Payer: Self-pay | Admitting: Internal Medicine

## 2018-05-20 VITALS — BP 149/66 | HR 91 | Temp 98.4°F | Resp 18 | Ht 65.0 in | Wt 248.3 lb

## 2018-05-20 DIAGNOSIS — R5383 Other fatigue: Secondary | ICD-10-CM

## 2018-05-20 DIAGNOSIS — C3490 Malignant neoplasm of unspecified part of unspecified bronchus or lung: Secondary | ICD-10-CM

## 2018-05-20 DIAGNOSIS — Z7901 Long term (current) use of anticoagulants: Secondary | ICD-10-CM

## 2018-05-20 DIAGNOSIS — I5032 Chronic diastolic (congestive) heart failure: Secondary | ICD-10-CM | POA: Diagnosis not present

## 2018-05-20 DIAGNOSIS — C3491 Malignant neoplasm of unspecified part of right bronchus or lung: Secondary | ICD-10-CM

## 2018-05-20 DIAGNOSIS — Z79899 Other long term (current) drug therapy: Secondary | ICD-10-CM | POA: Diagnosis not present

## 2018-05-20 DIAGNOSIS — Z9981 Dependence on supplemental oxygen: Secondary | ICD-10-CM

## 2018-05-20 DIAGNOSIS — K219 Gastro-esophageal reflux disease without esophagitis: Secondary | ICD-10-CM | POA: Diagnosis not present

## 2018-05-20 DIAGNOSIS — J449 Chronic obstructive pulmonary disease, unspecified: Secondary | ICD-10-CM

## 2018-05-20 DIAGNOSIS — Z9221 Personal history of antineoplastic chemotherapy: Secondary | ICD-10-CM | POA: Diagnosis not present

## 2018-05-20 DIAGNOSIS — I130 Hypertensive heart and chronic kidney disease with heart failure and stage 1 through stage 4 chronic kidney disease, or unspecified chronic kidney disease: Secondary | ICD-10-CM

## 2018-05-20 DIAGNOSIS — D509 Iron deficiency anemia, unspecified: Secondary | ICD-10-CM | POA: Diagnosis not present

## 2018-05-20 DIAGNOSIS — I481 Persistent atrial fibrillation: Secondary | ICD-10-CM | POA: Diagnosis not present

## 2018-05-20 DIAGNOSIS — R0602 Shortness of breath: Secondary | ICD-10-CM

## 2018-05-20 DIAGNOSIS — Z95828 Presence of other vascular implants and grafts: Secondary | ICD-10-CM

## 2018-05-20 DIAGNOSIS — D539 Nutritional anemia, unspecified: Secondary | ICD-10-CM

## 2018-05-20 LAB — CBC WITH DIFFERENTIAL (CANCER CENTER ONLY)
BASOS ABS: 0 10*3/uL (ref 0.0–0.1)
Basophils Relative: 1 %
Eosinophils Absolute: 0.2 10*3/uL (ref 0.0–0.5)
Eosinophils Relative: 3 %
HEMATOCRIT: 32.6 % — AB (ref 34.8–46.6)
HEMOGLOBIN: 10.7 g/dL — AB (ref 11.6–15.9)
LYMPHS ABS: 0.5 10*3/uL — AB (ref 0.9–3.3)
LYMPHS PCT: 9 %
MCH: 30 pg (ref 25.1–34.0)
MCHC: 32.8 g/dL (ref 31.5–36.0)
MCV: 91.7 fL (ref 79.5–101.0)
Monocytes Absolute: 0.5 10*3/uL (ref 0.1–0.9)
Monocytes Relative: 10 %
NEUTROS ABS: 4 10*3/uL (ref 1.5–6.5)
Neutrophils Relative %: 77 %
Platelet Count: 210 10*3/uL (ref 145–400)
RBC: 3.55 MIL/uL — AB (ref 3.70–5.45)
RDW: 19.1 % — ABNORMAL HIGH (ref 11.2–14.5)
WBC: 5.3 10*3/uL (ref 3.9–10.3)

## 2018-05-20 MED ORDER — HEPARIN SOD (PORK) LOCK FLUSH 100 UNIT/ML IV SOLN
500.0000 [IU] | Freq: Once | INTRAVENOUS | Status: AC | PRN
  Administered 2018-05-20: 500 [IU] via INTRAVENOUS
  Filled 2018-05-20: qty 5

## 2018-05-20 MED ORDER — SODIUM CHLORIDE 0.9 % IJ SOLN
10.0000 mL | INTRAMUSCULAR | Status: DC | PRN
  Administered 2018-05-20: 10 mL via INTRAVENOUS
  Filled 2018-05-20: qty 10

## 2018-05-20 NOTE — Telephone Encounter (Signed)
Printed avs and calender of upcoming appointment. Per 6/24 los

## 2018-05-20 NOTE — Patient Instructions (Signed)
Implanted Port Home Guide An implanted port is a type of central line that is placed under the skin. Central lines are used to provide IV access when treatment or nutrition needs to be given through a person's veins. Implanted ports are used for long-term IV access. An implanted port may be placed because:  You need IV medicine that would be irritating to the small veins in your hands or arms.  You need long-term IV medicines, such as antibiotics.  You need IV nutrition for a long period.  You need frequent blood draws for lab tests.  You need dialysis.  Implanted ports are usually placed in the chest area, but they can also be placed in the upper arm, the abdomen, or the leg. An implanted port has two main parts:  Reservoir. The reservoir is round and will appear as a small, raised area under your skin. The reservoir is the part where a needle is inserted to give medicines or draw blood.  Catheter. The catheter is a thin, flexible tube that extends from the reservoir. The catheter is placed into a large vein. Medicine that is inserted into the reservoir goes into the catheter and then into the vein.  How will I care for my incision site? Do not get the incision site wet. Bathe or shower as directed by your health care provider. How is my port accessed? Special steps must be taken to access the port:  Before the port is accessed, a numbing cream can be placed on the skin. This helps numb the skin over the port site.  Your health care provider uses a sterile technique to access the port. ? Your health care provider must put on a mask and sterile gloves. ? The skin over your port is cleaned carefully with an antiseptic and allowed to dry. ? The port is gently pinched between sterile gloves, and a needle is inserted into the port.  Only "non-coring" port needles should be used to access the port. Once the port is accessed, a blood return should be checked. This helps ensure that the port  is in the vein and is not clogged.  If your port needs to remain accessed for a constant infusion, a clear (transparent) bandage will be placed over the needle site. The bandage and needle will need to be changed every week, or as directed by your health care provider.  Keep the bandage covering the needle clean and dry. Do not get it wet. Follow your health care provider's instructions on how to take a shower or bath while the port is accessed.  If your port does not need to stay accessed, no bandage is needed over the port.  What is flushing? Flushing helps keep the port from getting clogged. Follow your health care provider's instructions on how and when to flush the port. Ports are usually flushed with saline solution or a medicine called heparin. The need for flushing will depend on how the port is used.  If the port is used for intermittent medicines or blood draws, the port will need to be flushed: ? After medicines have been given. ? After blood has been drawn. ? As part of routine maintenance.  If a constant infusion is running, the port may not need to be flushed.  How long will my port stay implanted? The port can stay in for as long as your health care provider thinks it is needed. When it is time for the port to come out, surgery will be   done to remove it. The procedure is similar to the one performed when the port was put in. When should I seek immediate medical care? When you have an implanted port, you should seek immediate medical care if:  You notice a bad smell coming from the incision site.  You have swelling, redness, or drainage at the incision site.  You have more swelling or pain at the port site or the surrounding area.  You have a fever that is not controlled with medicine.  This information is not intended to replace advice given to you by your health care provider. Make sure you discuss any questions you have with your health care provider. Document  Released: 11/13/2005 Document Revised: 04/20/2016 Document Reviewed: 07/21/2013 Elsevier Interactive Patient Education  2017 [employer]  

## 2018-05-20 NOTE — Progress Notes (Signed)
[place] Telephone:([phone]) [phone]   [phone]  OFFICE PROGRESS NOTE  [name], MD [place] [address]  DIAGNOSIS: Recurrent non-small cell lung cancer, adenocarcinoma with negative EGFR and ALK mutations initially diagnosed as a stage IIA in March 2008.  PRIOR THERAPY: 1. Status post concurrent chemoradiation with weekly carboplatin and paclitaxel. Last dose was given Apr 12, 2007. 2. Status post 3 cycles of consolidation chemotherapy with docetaxel. Last dose was given July 12, 2007. 3. 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 4. Status post 39 cycles of maintenance Alimta at a dose of 500 mg/m2. Last dose was given February 25, 2010, discontinued secondary to persistent anemia, but the patient has stable disease. 5. Tarceva 150 mg by mouth daily. Status post approximately 37 months of therapy.   6. Tarceva 100 mg by mouth daily, status post 13 months of treatment.  CURRENT THERAPY: Observation.  INTERVAL HISTORY: Diane [name] 77 y.o. female returns to the clinic today for follow-up visit.  The patient is feeling fine today with less fatigue compared to last visit.  She was seen by Dr. Lamonte Sakai and underwent bronchoscopy that was negative for malignancy.  She denied having any chest pain but continues to have shortness of breath at baseline increased with exertion with wheezing.  She denied having any cough or hemoptysis.  The patient has no weight loss or night sweats.  She is here today for evaluation and repeat blood work.  MEDICAL HISTORY: Past Medical History:  Diagnosis Date  . Anemia   . Aortic atherosclerosis ([place]) 12/03/2017  . Atrial fibrillation ([place])   . CAD (coronary artery disease), native coronary artery 12/03/2017   Noted on CT scan by calcification   . CHF (congestive heart failure) ([place])   . Chronic diastolic heart failure ([place]) 12/03/2017    . Dyspnea   . GERD (gastroesophageal reflux disease)   . Heart murmur   . History of blood transfusion    "related to chemo" (12/05/2017)  . Hypertension   . Hypertensive heart disease 12/03/2017  . Kidney disease, chronic, stage III (GFR 30-59 ml/min) ([place]) 12/03/2017  . Lung cancer ([place]) dx'd 02/2010  . Mitral valve prolapse   . Morbid obesity ([place]) 12/03/2017  . On home oxygen therapy   . 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 Persistent atrial fibrillation ([place]) 12/03/2017  . Restrictive lung disease 05/23/2017    ALLERGIES:  is allergic to septra ds [sulfamethoxazole-trimethoprim].  MEDICATIONS:  Current Outpatient Medications  Medication Sig Dispense Refill  . amiodarone (PACERONE) 200 MG tablet Take 1 tablet (200 mg total) by mouth daily.    Marland Kitchen apixaban (ELIQUIS) 5 MG TABS tablet Take 1 tablet (5 mg total) by mouth 2 (two) times daily. OK to restart on Friday 05/10/18.    Marland Kitchen calcitRIOL (ROCALTROL) 0.25 MCG capsule Take 0.25 mcg by mouth every Monday, Wednesday, and Friday.    . carvedilol (COREG) 25 MG tablet Take 12.5 mg by mouth 2 (two) times daily with a meal.     . diphenhydrAMINE (ALLERGY RELIEF) 25 MG tablet Take 25 mg by mouth every 6 (six) hours as needed.    . ferrous sulfate 325 (65 FE) MG tablet Take 325 mg by mouth 3 (three) times daily with meals.    . hydrALAZINE (APRESOLINE) 25 MG tablet Take 25 mg by mouth 2 (two) times daily.    . hydroxypropyl  methylcellulose / hypromellose (ISOPTO TEARS / GONIOVISC) 2.5 % ophthalmic solution Place 1 drop into both eyes 2 (two) times daily as needed for dry eyes.     . polyethylene glycol (MIRALAX / GLYCOLAX) packet Take 17 g by mouth at bedtime.     . torsemide (DEMADEX) 20 MG tablet Take 2 tablets (40 mg total) by mouth 2 (two) times daily with a meal.    . umeclidinium-vilanterol (ANORO ELLIPTA) 62.5-25 MCG/INH AEPB Inhale 1 puff into the lungs daily. 1 each 5   No current facility-administered medications for this visit.     Facility-Administered Medications Ordered in Other Visits  Medication Dose Route Frequency Provider Last Rate Last Dose  . sodium chloride 0.9 % injection 10 mL  10 mL Intravenous PRN Curt Bears, MD   10 mL at 10/14/13 1030    SURGICAL HISTORY:  Past Surgical History:  Procedure Laterality Date  . CARDIOVERSION N/A 12/06/2017   Procedure: CARDIOVERSION;  Surgeon: Jacolyn Reedy, MD;  Location: [facility] ENDOSCOPY;  Service: Cardiovascular;  Laterality: N/A;  . CATARACT EXTRACTION W/ INTRAOCULAR LENS  IMPLANT, BILATERAL Bilateral   . DERMABRASION  ~ 1966   "face; for acne"  . PAROTID GLAND TUMOR EXCISION Left   . PORTACATH PLACEMENT  11/03/08-[place]   tip in mid SVC-[place]. [place]  . THORACENTESIS Right   . TONSILLECTOMY    . VIDEO BRONCHOSCOPY WITH ENDOBRONCHIAL ULTRASOUND Right 05/08/2018   Procedure: VIDEO BRONCHOSCOPY WITH ENDOBRONCHIAL ULTRASOUND;  Surgeon: Collene Gobble, MD;  Location: [facility] OR;  Service: Thoracic;  Laterality: Right;    REVIEW OF SYSTEMS:  A comprehensive review of systems was negative except for: Constitutional: positive for fatigue Respiratory: positive for dyspnea on exertion Musculoskeletal: positive for arthralgias   PHYSICAL EXAMINATION: General appearance: alert, cooperative, fatigued and no distress Head: Normocephalic, without obvious abnormality, atraumatic Neck: no adenopathy, no JVD, supple, symmetrical, trachea midline and thyroid not enlarged, symmetric, no tenderness/mass/nodules Lymph nodes: Cervical, supraclavicular, and axillary nodes normal. Resp: wheezes bilaterally Back: symmetric, no curvature. ROM normal. No CVA tenderness. Cardio: regular rate and rhythm, S1, S2 normal, no murmur, click, rub or gallop GI: soft, non-tender; bowel sounds normal; no masses,  no organomegaly Extremities: extremities normal, atraumatic, no cyanosis or edema  ECOG PERFORMANCE STATUS: 1 - Symptomatic but completely ambulatory  Blood pressure (!) 149/66,  pulse 91, temperature 98.4 F (36.9 C), temperature source Oral, resp. rate 18, height _0  (1.651 m), weight 248 lb 4.8 oz (112.6 kg), SpO2 100 %.  LABORATORY DATA: Lab Results  Component Value Date   WBC 5.3 05/20/2018   HGB 10.7 (L) 05/20/2018   HCT 32.6 (L) 05/20/2018   MCV 91.7 05/20/2018   PLT 210 05/20/2018      Chemistry      Component Value Date/Time   NA 139 05/08/2018 0902   NA 140 10/01/2017 1116   K 4.9 05/08/2018 0902   K 4.2 10/01/2017 1116   CL 101 05/08/2018 0902   CL 102 05/08/2013 0851   CO2 26 05/08/2018 0902   CO2 29 10/01/2017 1116   BUN 29 (H) 05/08/2018 0902   BUN 31.7 (H) 10/01/2017 1116   CREATININE 1.88 (H) 05/08/2018 0902   CREATININE 1.95 (H) 04/15/2018 1140   CREATININE 1.8 (H) 10/01/2017 1116      Component Value Date/Time   CALCIUM 9.3 05/08/2018 0902   CALCIUM 9.5 10/01/2017 1116   ALKPHOS 58 04/15/2018 1140   ALKPHOS 71 10/01/2017 1116   AST 14 04/15/2018 1140  AST 14 10/01/2017 1116   ALT 17 04/15/2018 1140   ALT 8 10/01/2017 1116   BILITOT 0.5 04/15/2018 1140   BILITOT 0.71 10/01/2017 1116       RADIOGRAPHIC STUDIES: Dg Chest Port 1 View  Result Date: 05/08/2018 CLINICAL DATA:  77 year old female status post bronchoscopy and bronchoscopic biopsy right lower lobe. EXAM: PORTABLE CHEST 1 VIEW COMPARISON:  Chest CT 04/15/2018, and earlier. FINDINGS: Portable AP semi upright view at 1322 hours. Stable lung volumes. No pneumothorax. Dense right lower lung opacity redemonstrated. Increased pulmonary interstitial markings diffusely, perhaps due to increased vascularity. Stable left chest Port-A-Cath. Stable cardiac size and mediastinal contours. IMPRESSION: 1. Increased interstitial opacity favored due to pulmonary vascular congestion. Consider mild or developing interstitial edema. 2. Otherwise stable chest with no acute or adverse findings status post bronchoscopy and bronchoscopic biopsy. Electronically Signed   By: Genevie Ann M.D.   On:  05/08/2018 13:33   Dg C-arm Bronchoscopy  Result Date: 05/08/2018 C-ARM BRONCHOSCOPY: Fluoroscopy was utilized by the requesting physician.  No radiographic interpretation.    ASSESSMENT AND PLAN:  This is a very pleasant 77 years old white female with recurrent non-small cell lung cancer, adenocarcinoma diagnosed in March 2008 status post several chemotherapy regimens and was on treatment with Tarceva for close to 4 years. The patient is current on observation and she is feeling fine. Repeat bronchoscopy showed no concerning findings for disease recurrence. Her anemia has improved after starting the oral iron tablets. I discussed the lab results with the patient and recommended for her to continue in observation with repeat CT scan of the chest in 3 months. For the iron deficiency anemia, she will continue with the oral iron tablets. She was advised to call immediately if she has any concerning symptoms in the interval. The patient voices understanding of current disease status and treatment options and is in agreement with the current care plan. All questions were answered. The patient knows to call the clinic with any problems, questions or concerns. We can certainly see the patient much sooner if necessary.  Disclaimer: This note was dictated with voice recognition software. Similar sounding words can inadvertently be transcribed and may not be corrected upon review.

## 2018-05-22 ENCOUNTER — Encounter: Payer: Self-pay | Admitting: Gastroenterology

## 2018-05-27 ENCOUNTER — Ambulatory Visit: Payer: Medicare Other | Admitting: Cardiology

## 2018-05-27 ENCOUNTER — Encounter: Payer: Self-pay | Admitting: Cardiology

## 2018-05-27 VITALS — BP 143/72 | HR 88 | Ht 65.0 in | Wt 252.0 lb

## 2018-05-27 DIAGNOSIS — I700 Atherosclerosis of aorta: Secondary | ICD-10-CM | POA: Diagnosis not present

## 2018-05-27 DIAGNOSIS — I2510 Atherosclerotic heart disease of native coronary artery without angina pectoris: Secondary | ICD-10-CM

## 2018-05-27 DIAGNOSIS — I5032 Chronic diastolic (congestive) heart failure: Secondary | ICD-10-CM | POA: Diagnosis not present

## 2018-05-27 DIAGNOSIS — N183 Chronic kidney disease, stage 3 unspecified: Secondary | ICD-10-CM

## 2018-05-27 DIAGNOSIS — J984 Other disorders of lung: Secondary | ICD-10-CM

## 2018-05-27 DIAGNOSIS — I110 Hypertensive heart disease with heart failure: Secondary | ICD-10-CM

## 2018-05-27 DIAGNOSIS — I481 Persistent atrial fibrillation: Secondary | ICD-10-CM

## 2018-05-27 DIAGNOSIS — Z7901 Long term (current) use of anticoagulants: Secondary | ICD-10-CM | POA: Diagnosis not present

## 2018-05-27 DIAGNOSIS — I4819 Other persistent atrial fibrillation: Secondary | ICD-10-CM

## 2018-05-27 NOTE — Patient Instructions (Addendum)
No medication change  Will obtain records from [place].  Your physician recommends that you schedule a follow-up appointment in [place].

## 2018-05-27 NOTE — Progress Notes (Signed)
PCP: [name], MD  [place] - Nephrologist [place] - Pulmonologist.  Clinic Note: Chief Complaint  Patient presents with  . New Patient (Initial Visit)    Converting to cardiologist in [facility] system on epic for ease of use.  . Atrial Fibrillation    Complicated by diastolic heart failure    HPI: Diane [name] is a 77 y.o. female who is being seen today to re-establish with a new Cardiologist (in the [place]) at the request of [name], *.  Diane [name] [facility] was seeing Dr. Wynonia Lawman for PAfib - with Chronic DHF (HFpEF); they also listed coronary disease based on coronary calcification on CT scan, but I do not know of any additional evaluation has been done (she tells me that she had a stress test done by Dr. Wynonia Lawman).  Recent Hospitalizations:   May 08, 2013 -bronchoscopy  Studies Personally Reviewed - (if available, images/films reviewed: From Epic Chart or Care Everywhere)  2D Echo January 2019: EF 65 and 70%.  No regional wall motion normality.  Mild LVH.  Moderate LA dilation.  Normal PA pressures.  From Dr. Wynonia Lawman: Patient had a stress test and echocardiogram as well as a different test.  Interval History: Vaughn presents here today with a long list of medical problems and list of her physicians.  She has been having issues with renal insufficiency for which she sees [name].  Apparently when she was in the hospital back in January with heart failure secondary to A. fib RVR, she was over diuresed and her creatinine increased up to roughly 3.  She is therefore been converted from Lasix to torsemide.  But she is determined to say that she cannot take Lasix.  At present, she does not have much in the way of any cardiac symptoms.  She has not had any recurrent episodes of A. fib that she can tell up since her cardioversion.   She has not noticed any recurrence of A. fib, no chest tightness or pressure with rest or exertion.  No syncope/near  syncope or TIA/amaurosis fugax. She has not had any overt hematochezia, melena, hematuria or epistaxis, but apparently has had guaiac positive stool.  She remains anticoagulated on Eliquis but is currently undergoing GI evaluation for anemia with blood in her stool.  She has a colonoscopy pending on July 16.  (Will be fine for her to stop Eliquis 2 days preprocedure and restart the next day). With the current dose of torsemide, her edema seems to be pretty well controlled and she only sleeps on 2 pillows with minimal orthopnea no PND.  No claudication.  She still uses oxygen for COPD.  She has a indwelling port in left chest with a history of lung cancer.  ROS: A comprehensive was performed. Review of Systems  Constitutional: Positive for malaise/fatigue. Negative for chills, fever and weight loss.  HENT: Negative for congestion and nosebleeds.   Respiratory: Positive for cough, shortness of breath and wheezing. Negative for sputum production.   Gastrointestinal: Positive for blood in stool (My understanding was that this sounded like he was guaiac positive.  She does not describe hematochezia.). Negative for abdominal pain, constipation and melena.  Genitourinary: Negative for dysuria and hematuria.  Musculoskeletal: Positive for joint pain.  Neurological: Positive for weakness (Generalized). Negative for dizziness and headaches.  Psychiatric/Behavioral: Negative for depression and memory loss. The patient is nervous/anxious. Insomnia: Refuses sleep study.  Wears oxygen at night.   All  other systems reviewed and are negative.  I have reviewed and (if needed) personally updated the patient's problem list, medications, allergies, past medical and surgical history, social and family history.   Past Medical History:  Diagnosis Date  . Anemia   . Aortic atherosclerosis ([place]) 12/03/2017   Noted on CT scan  . CAD (coronary artery disease), native coronary artery 12/03/2017   Noted on CT scan by  calcification   . CHF (congestive heart failure) ([place])   . Chronic diastolic heart failure ([place]) 12/03/2017  . Dyspnea   . GERD (gastroesophageal reflux disease)   . Heart murmur   . History of blood transfusion    "related to chemo" (12/05/2017)  . Hypertension   . Hypertensive heart disease 12/03/2017  . Kidney disease, chronic, stage III (GFR 30-59 ml/min) ([place]) 12/03/2017  . Lung cancer ([place]) dx'd 02/2010  . Mitral valve prolapse   . Morbid obesity ([place]) 12/03/2017  . On home oxygen therapy   . 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 Paroxysmal atrial fibrillation (HCC)   . Persistent atrial fibrillation ([place]) 12/03/2017  . Restrictive lung disease 05/23/2017    Past Surgical History:  Procedure Laterality Date  . CARDIOVERSION N/A 12/06/2017   Procedure: CARDIOVERSION;  Surgeon: Jacolyn Reedy, MD;  Location: [facility] ENDOSCOPY;  Service: Cardiovascular;  Laterality: N/A;  . CATARACT EXTRACTION W/ INTRAOCULAR LENS  IMPLANT, BILATERAL Bilateral   . DERMABRASION  ~ 1966   "face; for acne"  . PAROTID GLAND TUMOR EXCISION Left   . PORTACATH PLACEMENT  11/03/08-[place]   tip in mid SVC-E. Mansell  . THORACENTESIS Right   . TONSILLECTOMY    . VIDEO BRONCHOSCOPY WITH ENDOBRONCHIAL ULTRASOUND Right 05/08/2018   Procedure: VIDEO BRONCHOSCOPY WITH ENDOBRONCHIAL ULTRASOUND;  Surgeon: Collene Gobble, MD;  Location: [facility] OR;  Service: Thoracic;  Laterality: Right;    Current Meds  Medication Sig  . amiodarone (PACERONE) 200 MG tablet Take 1 tablet (200 mg total) by mouth daily.  Marland Kitchen apixaban (ELIQUIS) 5 MG TABS tablet Take 1 tablet (5 mg total) by mouth 2 (two) times daily. OK to restart on Friday 05/10/18.  Marland Kitchen calcitRIOL (ROCALTROL) 0.25 MCG capsule Take 0.25 mcg by mouth every Monday, Wednesday, and Friday.  . carvedilol (COREG) 25 MG tablet Take 12.5 mg by mouth 2 (two) times daily with a meal.   . ferrous sulfate 325 (65 FE) MG tablet Take 325 mg by mouth 3 (three) times daily with meals.  .  hydrALAZINE (APRESOLINE) 25 MG tablet Take 25 mg by mouth 2 (two) times daily.  . hydroxypropyl methylcellulose / hypromellose (ISOPTO TEARS / GONIOVISC) 2.5 % ophthalmic solution Place 1 drop into both eyes 2 (two) times daily as needed for dry eyes.   . polyethylene glycol (MIRALAX / GLYCOLAX) packet Take 17 g by mouth at bedtime.   . torsemide (DEMADEX) 20 MG tablet Take 2 tablets (40 mg total) by mouth 2 (two) times daily with a meal.  . umeclidinium-vilanterol (ANORO ELLIPTA) 62.5-25 MCG/INH AEPB Inhale 1 puff into the lungs daily.  . [DISCONTINUED] diphenhydrAMINE (ALLERGY RELIEF) 25 MG tablet Take 25 mg by mouth every 6 (six) hours as needed.    Allergies  Allergen Reactions  . Septra Ds [Sulfamethoxazole-Trimethoprim] Nausea Only    Social History   Tobacco Use  . Smoking status: Former Smoker    Packs/day: 2.00    Years: 40.00    Pack years: 80.00    Types: Cigarettes    Last attempt to  quit: 11/27/2001    Years since quitting: 16.5  . Smokeless tobacco: Never Used  Substance Use Topics  . Alcohol use: Yes    Comment: 12/05/2017 "once in a great while I might have a drink"  . Drug use: No   Social History   Social History Narrative   [facility] (02/19/17):   Patient is originally from [facility]. She has also lived in [place]. She has worked primarily Veterinary surgeon. Has a dog currently. No bird or mold exposure.     family history includes Breast cancer in her mother; COPD in her cousin; Diabetes in her brother and father; Heart attack in her father and mother.  Wt Readings from Last 3 Encounters:  05/27/18 252 lb (114.3 kg)  05/20/18 248 lb 4.8 oz (112.6 kg)  05/08/18 244 lb (110.7 kg)    PHYSICAL EXAM BP (!) 143/72   Pulse 88   Ht 5\' 5"  (1.651 m)   Wt 252 lb (114.3 kg)   BMI 41.93 kg/m  Physical Exam  Constitutional: She is oriented to person, place, and time. No distress.  Chronically ill but nontoxic-appearing elderly woman.  She is in no acute distress.  She is  using nasal cannula oxygen.  She seems very well read on her health conditions.  HENT:  Head: Normocephalic and atraumatic.  Mouth/Throat: No oropharyngeal exudate.  Eyes: Pupils are equal, round, and reactive to light. EOM are normal. No scleral icterus.  Neck: Normal range of motion. Neck supple. No hepatojugular reflux and no JVD (Mildly elevated may be 8-9 cmH2O) present. Carotid bruit is not present.  Cardiovascular: Normal rate, regular rhythm, normal heart sounds, intact distal pulses and normal pulses.  No extrasystoles are present. PMI is not displaced. Exam reveals no gallop and no friction rub.  No murmur heard. Pulmonary/Chest: She has no wheezes. She has no rales. She exhibits no tenderness.  Accessory muscle use for breathing at baseline, but does appear to be nonlabored. Diminished breath sounds throughout  with mild late expiratory wheezing.  But otherwise clear.  Abdominal: Soft. Bowel sounds are normal. She exhibits no distension. There is no tenderness. There is no rebound.  Musculoskeletal: Normal range of motion. She exhibits edema (Trivial.).  Lymphadenopathy:    She has no cervical adenopathy.  Neurological: She is alert and oriented to person, place, and time. No cranial nerve deficit.  Skin: Skin is warm and dry. No erythema.  Psychiatric: She has a normal mood and affect. Her behavior is normal. Judgment and thought content normal.  Borderline hypervigilant     Adult ECG Report Not checked today  Other studies Reviewed: Additional studies/ records that were reviewed today include:  Recent Labs:  No recent labs available.   ASSESSMENT / PLAN: Problem List Items Addressed This Visit    Restrictive lung disease (Chronic)    Partially related to scar from radiation but also obesity.  Monitored and managed by Dr. Lamonte Sakai. Is on home oxygen.      Persistent atrial fibrillation (HCC) (Chronic)    Persistent paroxysmal atrial fibrillation with no recurrence since  cardioversion.  She remains on amiodarone for rhythm control along with beta-blocker for additional rate control. She is on Eliquis for anticoagulation.  This patients CHA2DS2-VASc Score and unadjusted Ischemic Stroke Rate (% per year) is equal to 9.7 % stroke rate/year from a score of 6  Above score calculated as 1 point each if present [CHF, HTN, DM, Vascular=MI/PAD/Aortic Plaque, Age if 65-74, or Female] Above score calculated as 2  points each if present [Age > 75, or Stroke/TIA/TE]       Morbid obesity ([place]) (Chronic)    She seems relatively sedentary, mostly because of her oxygen requiring COPD.  Discussed importance of dietary modification.  We did not go to too many details with this being her first visit.      Kidney disease, chronic, stage III (GFR 30-59 ml/min) (HCC) (Chronic)    Renal function followed by Dr. Edrick Oh.  Creatinine has improved since January hospitalization. Lab Results  Component Value Date   CREATININE 1.88 (H) 05/08/2018   BUN 29 (H) 05/08/2018   K 4.9 05/08/2018   Per Dr. [name], she is now on torsemide 40 mg in the morning and 20 mg in the evening for diuretic. She is also on iron supplementation for anemia.      Hypertensive heart disease (Chronic)    With chronic diastolic dysfunction, and diastolic heart failure in setting of A. fib RVR.      Current use of long term anticoagulation (Chronic)    Remains stable on Eliquis with no bleeding issues at present, however she is undergoing GI evaluation for guaiac positive stool.  Plan: Would be okay to hold Eliquis for 48 hours preprocedure (colonoscopy).  Restart when safe post procedure depending on if but biopsy is done or not.  If biopsy not done can probably restart that night or the next day, however if biopsy is done would wait 1 or 2 days.      Coronary artery disease involving native coronary artery of native heart without angina pectoris (Chronic)    Coronary calcification noted on CT  scan.  She is never had a heart catheterization or coronary CT angiogram.  Had a Myoview done by Dr. Wynonia Lawman.  Results pending at time of this evaluation.  My inclination is that it was not positive for ischemia because no further evaluation was done.  She is not on a statin.  I do not have any recent labs on her.  We will need to see what was done by Dr. Wynonia Lawman.      Chronic diastolic heart failure (HCC) - Primary (Chronic)    She had hyperdynamic left ventricular function on echo with only mild LVH.  I suspect that her heart failure is really only related to being in and out of A. fib. Some of her lower extremity edema is probably related to venous stasis.  Despite this she is on stable dose of torsemide and carvedilol.  She is taking hydralazine for afterload reduction.  Nighttime oxygen.      Aortic atherosclerosis ([place])      I spent a total of 45 minutes with the patient and chart review. >  50% of the time was spent in direct patient consultation.   Current medicines are reviewed at length with the patient today.  (+/- concerns) was insistent that she cannot take Lasix.  Otherwise no complaints. The following changes have been made:  None  Patient Instructions  No medication change  Will obtain records from [place].  Your physician recommends that you schedule a follow-up appointment in [place].    Studies Ordered:   No orders of the defined types were placed in this encounter.     Glenetta Hew, M.D., M.S. Interventional Cardiologist   Pager # [phone] Phone # [phone] [address] [place], [place] [address]   Thank you for choosing Heartcare at [facility]!!

## 2018-05-28 ENCOUNTER — Encounter: Payer: Self-pay | Admitting: Cardiology

## 2018-05-28 NOTE — Assessment & Plan Note (Signed)
Remains stable on Eliquis with no bleeding issues at present, however she is undergoing GI evaluation for guaiac positive stool.  Plan: Would be okay to hold Eliquis for 48 hours preprocedure (colonoscopy).  Restart when safe post procedure depending on if but biopsy is done or not.  If biopsy not done can probably restart that night or the next day, however if biopsy is done would wait 1 or 2 days.

## 2018-05-28 NOTE — Assessment & Plan Note (Addendum)
Coronary calcification noted on CT scan.  She is never had a heart catheterization or coronary CT angiogram.  Had a Myoview done by Dr. Wynonia Lawman.  Results pending at time of this evaluation.  My inclination is that it was not positive for ischemia because no further evaluation was done.  She is not on a statin.  I do not have any recent labs on her.  We will need to see what was done by Dr. Wynonia Lawman.

## 2018-05-28 NOTE — Assessment & Plan Note (Signed)
She had hyperdynamic left ventricular function on echo with only mild LVH.  I suspect that her heart failure is really only related to being in and out of A. fib. Some of her lower extremity edema is probably related to venous stasis.  Despite this she is on stable dose of torsemide and carvedilol.  She is taking hydralazine for afterload reduction.  Nighttime oxygen.

## 2018-05-28 NOTE — Assessment & Plan Note (Signed)
Renal function followed by Dr. Edrick Oh.  Creatinine has improved since January hospitalization. Lab Results  Component Value Date   CREATININE 1.88 (H) 05/08/2018   BUN 29 (H) 05/08/2018   K 4.9 05/08/2018   Per Dr. [name], she is now on torsemide 40 mg in the morning and 20 mg in the evening for diuretic. She is also on iron supplementation for anemia.

## 2018-05-28 NOTE — Assessment & Plan Note (Signed)
Partially related to scar from radiation but also obesity.  Monitored and managed by Dr. Lamonte Sakai. Is on home oxygen.

## 2018-05-28 NOTE — Assessment & Plan Note (Signed)
With chronic diastolic dysfunction, and diastolic heart failure in setting of A. fib RVR.

## 2018-05-28 NOTE — Assessment & Plan Note (Signed)
She seems relatively sedentary, mostly because of her oxygen requiring COPD.  Discussed importance of dietary modification.  We did not go to too many details with this being her first visit.

## 2018-05-28 NOTE — Assessment & Plan Note (Signed)
Persistent paroxysmal atrial fibrillation with no recurrence since cardioversion.  She remains on amiodarone for rhythm control along with beta-blocker for additional rate control. She is on Eliquis for anticoagulation.  This patients CHA2DS2-VASc Score and unadjusted Ischemic Stroke Rate (% per year) is equal to 9.7 % stroke rate/year from a score of 6  Above score calculated as 1 point each if present [CHF, HTN, DM, Vascular=MI/PAD/Aortic Plaque, Age if 65-74, or Female] Above score calculated as 2 points each if present [Age > 75, or Stroke/TIA/TE]

## 2018-06-11 ENCOUNTER — Telehealth: Payer: Self-pay | Admitting: Emergency Medicine

## 2018-06-11 ENCOUNTER — Encounter: Payer: Self-pay | Admitting: Gastroenterology

## 2018-06-11 ENCOUNTER — Ambulatory Visit: Payer: Medicare Other | Admitting: Gastroenterology

## 2018-06-11 VITALS — BP 140/80 | HR 83 | Ht 65.0 in | Wt 254.0 lb

## 2018-06-11 DIAGNOSIS — Z7901 Long term (current) use of anticoagulants: Secondary | ICD-10-CM

## 2018-06-11 DIAGNOSIS — D649 Anemia, unspecified: Secondary | ICD-10-CM | POA: Diagnosis not present

## 2018-06-11 DIAGNOSIS — Z9981 Dependence on supplemental oxygen: Secondary | ICD-10-CM | POA: Insufficient documentation

## 2018-06-11 DIAGNOSIS — R195 Other fecal abnormalities: Secondary | ICD-10-CM | POA: Diagnosis not present

## 2018-06-11 MED ORDER — NA SULFATE-K SULFATE-MG SULF 17.5-3.13-1.6 GM/177ML PO SOLN: 1.0000 | ORAL | 0 refills | Status: AC

## 2018-06-11 NOTE — Telephone Encounter (Signed)
Patient with diagnosis of Afib on Eliquis for anticoagulation.    Procedure: endoscopy/colonoscopy Date of procedure: 07/16/18  CHADS2-VASc score of  6 (CHF, HTN, AGE, DM2, stroke/tia x 2, CAD, AGE, female)  CrCl 41ml/min  Per Dr. Allison Quarry recent [place] note - "Would be okay to hold Eliquis for 48 hours preprocedure (colonoscopy).  Restart when safe post procedure depending on if but biopsy is done or not.  If biopsy not done can probably restart that night or the next day, however if biopsy is done would wait 1 or 2 days."

## 2018-06-11 NOTE — Progress Notes (Signed)
06/11/2018 Diane [name] [id number] 05-17-1941   HISTORY OF PRESENT ILLNESS:  This is a 77 year old female who is new to our office.  Is here today for evaluation of anemia.  Hgb in May was 8.1 grams.  Looks like baseline is about 11.5-12.0 grams even just 6 months ago. Vitamin B12 also low.  Iron levels low, but ferritin is normal, more c/w AOCD.  She has CKD and is O2 dependent.  Has history of lung cancer and follows with [name].  She was placed on iron supplements and repeat labs a few weeks ago show improvement in Hgb to 10.7 grams. Anyway, apparently also had a single heme positive stool as well, although I do not see that result in here.  Last colonoscopy 19 years ago.  Has atrial fibrillation and is on Eliquis.  Cardiologist is Dr. Ellyn Hack.  She denies seeing any red blood in her stools.  Stools range in color from very dark brown, but not necessarily black per se, to very light color.  Moves her bowels regularly.   Past Medical History:  Diagnosis Date  . Anemia   . Aortic atherosclerosis ([place]) 12/03/2017   Noted on CT scan  . CAD (coronary artery disease), native coronary artery 12/03/2017   Noted on CT scan by calcification   . CHF (congestive heart failure) ([place])   . Chronic diastolic heart failure ([place]) 12/03/2017  . Dyspnea   . GERD (gastroesophageal reflux disease)   . Heart murmur   . History of blood transfusion    "related to chemo" (12/05/2017)  . Hypertension   . Hypertensive heart disease 12/03/2017  . Kidney disease, chronic, stage III (GFR 30-59 ml/min) ([place]) 12/03/2017  . Lung cancer ([place]) dx'd 02/2010  . Mitral valve prolapse   . Morbid obesity ([place]) 12/03/2017  . On home oxygen therapy   . 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 Paroxysmal atrial fibrillation (HCC)   . Persistent atrial fibrillation ([place]) 12/03/2017  . Restrictive lung disease 05/23/2017   Past Surgical History:  Procedure Laterality Date  . CARDIOVERSION N/A 12/06/2017   Procedure: CARDIOVERSION;  Surgeon: Jacolyn Reedy, MD;  Location[facility] ENDOSCOPY;  Service: Cardiovascular;  Laterality: N/A;  . CATARACT EXTRACTION W/ INTRAOCULAR LENS  IMPLANT, BILATERAL Bilateral   . DERMABRASION  ~ 1966   "face; for acne"  . PAROTID GLAND TUMOR EXCISION Left   . PORTACATH PLACEMENT  11/03/08-[place]   tip in mid SVC-E. Mansell  . THORACENTESIS Right   . TONSILLECTOMY    . VIDEO BRONCHOSCOPY WITH ENDOBRONCHIAL ULTRASOUND Right 05/08/2018   Procedure: VIDEO BRONCHOSCOPY WITH ENDOBRONCHIAL ULTRASOUND;  Surgeon: Collene Gobble, MD;  Location: [place];  Service: Thoracic;  Laterality: Right;    reports that she quit smoking about 16 years ago. Her smoking use included cigarettes. She has a 80.00 pack-year smoking history. She has never used smokeless tobacco. She reports that she drinks alcohol. She reports that she does not use drugs. family history includes Breast cancer in her mother; COPD in her cousin; Diabetes in her brother and father; Heart attack in her father and mother. Allergies  Allergen Reactions  . Septra Ds [Sulfamethoxazole-Trimethoprim] Nausea Only      Outpatient Encounter Medications as of 06/11/2018  Medication Sig  . acetaminophen (TYLENOL) 500 MG tablet Take 1,000 mg by mouth at bedtime.  Marland Kitchen amiodarone (PACERONE) 200 MG tablet Take 1 tablet (200 mg total) by mouth daily.  Marland Kitchen apixaban (ELIQUIS) 5 MG  TABS tablet Take 1 tablet (5 mg total) by mouth 2 (two) times daily. OK to restart on Friday 05/10/18.  Marland Kitchen calcitRIOL (ROCALTROL) 0.25 MCG capsule Take 0.25 mcg by mouth every Monday, Wednesday, and Friday.  . carvedilol (COREG) 25 MG tablet Take 12.5 mg by mouth 2 (two) times daily with a meal.   . ferrous sulfate 325 (65 FE) MG tablet Take 325 mg by mouth 3 (three) times daily with meals.  . hydrALAZINE (APRESOLINE) 25 MG tablet Take 25 mg by mouth 2 (two) times daily.  . hydroxypropyl methylcellulose / hypromellose (ISOPTO TEARS / GONIOVISC) 2.5 % ophthalmic  solution Place 1 drop into both eyes 2 (two) times daily as needed for dry eyes.   . polyethylene glycol (MIRALAX / GLYCOLAX) packet Take 17 g by mouth at bedtime.   . torsemide (DEMADEX) 20 MG tablet Take 2 tablets (40 mg total) by mouth 2 (two) times daily with a meal.  . umeclidinium-vilanterol (ANORO ELLIPTA) 62.5-25 MCG/INH AEPB Inhale 1 puff into the lungs daily.   Facility-Administered Encounter Medications as of 06/11/2018  Medication  . sodium chloride 0.9 % injection 10 mL     REVIEW OF SYSTEMS  : All other systems reviewed and negative except where noted in the History of Present Illness.   PHYSICAL EXAM: BP 140/80   Pulse 83   Ht 5\' 5"  (1.651 m)   Wt 254 lb (115.2 kg)   BMI 42.27 kg/m  General: Well developed white female in no acute distress Head: Normocephalic and atraumatic Eyes:  sclerae anicteric,conjunctive pink. Ears: Normal auditory acuity Neck: Supple, no masses.  Lungs: Clear throughout to auscultation Heart: Regular rate and rhythm Abdomen: Soft, nontender, non distended. No masses or hepatomegaly noted. Normal bowel sounds Rectal: Musculoskeletal: Symmetrical with no gross deformities  Skin: No lesions on visible extremities Extremities: No edema  Neurological: Alert oriented x 4, grossly non-focal Psychological:  Alert and cooperative. 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  Vitamin B12 also low.  Iron levels low, but ferritin is normal, more c/w AOCD.  Anyway, apparently also had a single heme positive stool as well.  Last colonoscopy 19 years ago.  Will schedule for both EGD and colonoscopy with Dr. Silverio Decamp at [facility].  Will start OTC oral Vitamin B12 supplement for now as well, 2500 mcg. *Atrial fibrillation on Eliquis:  Will hold Eliquis for 3 days prior to endoscopic procedures - will instruct when and how to resume after procedure. Benefits and risks of procedure explained including  risks of bleeding, perforation, infection, missed lesions, reactions to medications and possible need for hospitalization and surgery for complications. Additional rare but real risk of stroke or other vascular clotting events off of Eliquis also explained and need to seek urgent help if any signs of these problems occur. Will communicate by phone or EMR with patient's prescribing provider, Dr. Ellyn Hack, to confirm that holding Eliquis is reasonable in this case.  *O2 dependent:  Will need procedures at [facility] hospital.   CC:  [name], *

## 2018-06-11 NOTE — Patient Instructions (Addendum)
Please purchase the following medications over the counter and take as directed: Vitamin B12 250 mg daily  You have been scheduled for an endoscopy and colonoscopy. Please follow the written instructions given to you at your visit today. Please pick up your prep supplies at the pharmacy within the next 1-3 days. If you use inhalers (even only as needed), please bring them with you on the day of your procedure. Your physician has requested that you go to www.[email or web address].com and enter the access code given to you at your visit today. This web site gives a general overview about your procedure. However, you should still follow specific instructions given to you by our office regarding your preparation for the procedure.

## 2018-06-11 NOTE — Telephone Encounter (Signed)
[facility] Pre-operative Risk Assessment     Request for surgical clearance:     Endoscopy Procedure  What type of surgery is being performed?     Endoscopy/colonoscopy  When is this surgery scheduled?     07-16-18  What type of clearance is required ?   Pharmacy  Are there any medications that need to be held prior to surgery and how long? Eliquis 3 days  Practice name and name of physician performing surgery?      [facility]  What is your office phone and fax number?      Phone- [phone]  [phone]  Anesthesia type (None, local, MAC, general) ?       MAC

## 2018-06-12 NOTE — Telephone Encounter (Signed)
I thought we had discussed 3 days for her??

## 2018-06-12 NOTE — Telephone Encounter (Signed)
Patient informed to hold Eliquis 2 days before procedure, and that she will be informed at discharge when to resume it depending on if she has a biopsy. She verbalized understanding.

## 2018-06-12 NOTE — Telephone Encounter (Signed)
Dr. Ellyn Hack would like to hold it 48 hours before and 1-2 days post procedure if she has a biopsy. I put in my original request 3 days. Should we ask Dr. Silverio Decamp?

## 2018-06-12 NOTE — Telephone Encounter (Signed)
I would forward this to her just to let her know.  2 days should be fine, but I thought with lower GFR and Cr clearance that we preferred 3 days.

## 2018-06-12 NOTE — Telephone Encounter (Signed)
Ok to hold for 2 days prior to the procedure. Thanks

## 2018-06-13 NOTE — Progress Notes (Signed)
Reviewed and agree with documentation and assessment and plan. K. Veena Tayah Idrovo , MD   

## 2018-07-01 ENCOUNTER — Other Ambulatory Visit: Payer: Self-pay

## 2018-07-01 MED ORDER — AMIODARONE HCL 200 MG PO TABS: 200.0000 mg | ORAL_TABLET | Freq: Every day | ORAL | 10 refills | Status: DC

## 2018-07-03 ENCOUNTER — Telehealth: Payer: Self-pay | Admitting: Cardiology

## 2018-07-03 MED ORDER — AMIODARONE HCL 200 MG PO TABS: 200.0000 mg | ORAL_TABLET | Freq: Every day | ORAL | 1 refills | Status: DC

## 2018-07-03 MED ORDER — TORSEMIDE 20 MG PO TABS: 40.0000 mg | ORAL_TABLET | Freq: Two times a day (BID) | ORAL | 1 refills | Status: DC

## 2018-07-03 NOTE — Telephone Encounter (Signed)
New Message     *STAT* If patient is at the pharmacy, call can be transferred to refill team.   1. Which medications need to be refilled? (please list name of each medication and dose if known) amiodarone (PACERONE) 200 MG tablet and torsemide (DEMADEX) 20 MG tablet   2. Which pharmacy/location (including street and city if local pharmacy) is medication to be sent to? [facility], [address]  3. Do they need a 30 day or 90 day supply? [place]

## 2018-07-08 ENCOUNTER — Encounter: Payer: Self-pay | Admitting: Emergency Medicine

## 2018-07-08 ENCOUNTER — Ambulatory Visit: Payer: Medicare Other | Admitting: Emergency Medicine

## 2018-07-08 ENCOUNTER — Inpatient Hospital Stay: Payer: Medicare Other | Attending: Internal Medicine

## 2018-07-08 DIAGNOSIS — Z9221 Personal history of antineoplastic chemotherapy: Secondary | ICD-10-CM | POA: Insufficient documentation

## 2018-07-08 DIAGNOSIS — J449 Chronic obstructive pulmonary disease, unspecified: Secondary | ICD-10-CM

## 2018-07-08 DIAGNOSIS — J9611 Chronic respiratory failure with hypoxia: Secondary | ICD-10-CM | POA: Diagnosis not present

## 2018-07-08 DIAGNOSIS — Z452 Encounter for adjustment and management of vascular access device: Secondary | ICD-10-CM | POA: Insufficient documentation

## 2018-07-08 DIAGNOSIS — Z923 Personal history of irradiation: Secondary | ICD-10-CM | POA: Insufficient documentation

## 2018-07-08 DIAGNOSIS — C3491 Malignant neoplasm of unspecified part of right bronchus or lung: Secondary | ICD-10-CM | POA: Diagnosis present

## 2018-07-08 DIAGNOSIS — Z95828 Presence of other vascular implants and grafts: Secondary | ICD-10-CM

## 2018-07-08 MED ORDER — SODIUM CHLORIDE 0.9 % IJ SOLN
10.0000 mL | INTRAMUSCULAR | Status: DC | PRN
  Administered 2018-07-08: 10 mL via INTRAVENOUS
  Filled 2018-07-08: qty 10

## 2018-07-08 MED ORDER — ALBUTEROL SULFATE HFA 108 (90 BASE) MCG/ACT IN AERS: 2.0000 | INHALATION_SPRAY | RESPIRATORY_TRACT | 5 refills | Status: AC | PRN

## 2018-07-08 MED ORDER — HEPARIN SOD (PORK) LOCK FLUSH 100 UNIT/ML IV SOLN
500.0000 [IU] | Freq: Once | INTRAVENOUS | Status: AC | PRN
  Administered 2018-07-08: 500 [IU] via INTRAVENOUS
  Filled 2018-07-08: qty 5

## 2018-07-08 NOTE — Assessment & Plan Note (Addendum)
Slight progression of her dyspnea, ? Due to her obstructive disease.  Consider also some progression of her hilar adenopathy, right-sided postobstructive process.  She does have focal rhonchi on the right.  Unclear that she needs an inhaled steroid.  We will plan to continue her Anoro.  Restart her albuterol to be used as needed, which she no longer has.

## 2018-07-08 NOTE — Assessment & Plan Note (Signed)
Biopsies of her hilar nodes, the right lower lobe airways and right lower lobe parenchyma were all negative for malignancy.  I am still concerned that she could have a recurrence that I have failed to diagnose.  She has a repeat CT scan of her chest planned for September.  We will review with Dr. [name] once available.  She may need further testing to plan therapy

## 2018-07-08 NOTE — Patient Instructions (Signed)
Implanted Port Home Guide An implanted port is a type of central line that is placed under the skin. Central lines are used to provide IV access when treatment or nutrition needs to be given through a person's veins. Implanted ports are used for long-term IV access. An implanted port may be placed because:  You need IV medicine that would be irritating to the small veins in your hands or arms.  You need long-term IV medicines, such as antibiotics.  You need IV nutrition for a long period.  You need frequent blood draws for lab tests.  You need dialysis.  Implanted ports are usually placed in the chest area, but they can also be placed in the upper arm, the abdomen, or the leg. An implanted port has two main parts:  Reservoir. The reservoir is round and will appear as a small, raised area under your skin. The reservoir is the part where a needle is inserted to give medicines or draw blood.  Catheter. The catheter is a thin, flexible tube that extends from the reservoir. The catheter is placed into a large vein. Medicine that is inserted into the reservoir goes into the catheter and then into the vein.  How will I care for my incision site? Do not get the incision site wet. Bathe or shower as directed by your health care provider. How is my port accessed? Special steps must be taken to access the port:  Before the port is accessed, a numbing cream can be placed on the skin. This helps numb the skin over the port site.  Your health care provider uses a sterile technique to access the port. ? Your health care provider must put on a mask and sterile gloves. ? The skin over your port is cleaned carefully with an antiseptic and allowed to dry. ? The port is gently pinched between sterile gloves, and a needle is inserted into the port.  Only "non-coring" port needles should be used to access the port. Once the port is accessed, a blood return should be checked. This helps ensure that the port  is in the vein and is not clogged.  If your port needs to remain accessed for a constant infusion, a clear (transparent) bandage will be placed over the needle site. The bandage and needle will need to be changed every week, or as directed by your health care provider.  Keep the bandage covering the needle clean and dry. Do not get it wet. Follow your health care provider's instructions on how to take a shower or bath while the port is accessed.  If your port does not need to stay accessed, no bandage is needed over the port.  What is flushing? Flushing helps keep the port from getting clogged. Follow your health care provider's instructions on how and when to flush the port. Ports are usually flushed with saline solution or a medicine called heparin. The need for flushing will depend on how the port is used.  If the port is used for intermittent medicines or blood draws, the port will need to be flushed: ? After medicines have been given. ? After blood has been drawn. ? As part of routine maintenance.  If a constant infusion is running, the port may not need to be flushed.  How long will my port stay implanted? The port can stay in for as long as your health care provider thinks it is needed. When it is time for the port to come out, surgery will be   done to remove it. The procedure is similar to the one performed when the port was put in. When should I seek immediate medical care? When you have an implanted port, you should seek immediate medical care if:  You notice a bad smell coming from the incision site.  You have swelling, redness, or drainage at the incision site.  You have more swelling or pain at the port site or the surrounding area.  You have a fever that is not controlled with medicine.  This information is not intended to replace advice given to you by your health care provider. Make sure you discuss any questions you have with your health care provider. Document  Released: 11/13/2005 Document Revised: 04/20/2016 Document Reviewed: 07/21/2013 Elsevier Interactive Patient Education  2017 [employer]  

## 2018-07-08 NOTE — Progress Notes (Signed)
Subjective:    Patient ID: Diane [name], female    DOB: Sep 09, 1941, 77 y.o.   MRN: [id number]  HPI Diane [name] is a 77 year old woman, former smoker, with a history of COPD, hypertension and fairly new dx atrial fibrillation with diastolic CHF. Underwent DCCV.  She is on amiodarone and anticoagulation.  Most recent pulmonary function testing was 05/23/17 which I have reviewed.  This shows evidence for mixed obstruction and restriction, severely decreased FEV1 without a bronchodilator response.  Her total lung capacity is reduced, decreased diffusion capacity that corrects to the normal range when adjusted for alveolar volume. She was started on Anoro at her last visit here. She has albuterol but does not use. She cannot tell how much the Anoro has helped some w her wheezing.   She is on oxygen at 2L/min with exertion. She remains active.   History of non-small cell lung cancer originally diagnosed 2008, with a recurrence.  He has been treated with chemotherapy and long-term Tarceva, 4 yrs and able to come off of this 1 yr ago.  She is currently on observation.  She has been under surveillance, most recent CT scan of the chest 01/21/18 which I reviewed.  This showed chronic right sided changes post radiation treatment, a stable small groundglass nodule the left upper lobe (3 mm), a new left upper lobe 4 mm nodule, unchanged 5 mm left lower lobe nodule.  There was also a small new left pleural effusion.  Thoracentesis was attempted 01/31/18 but the effusion was too small to sample.   ROV 07/08/18 --follow-up visit for patient with a history of COPD, atrial fibrillation with amiodarone use, non-small cell lung cancer originally diagnosed 2008, on observation and followed by Dr. [name].  She underwent bronchoscopy 6/12//2019 when it was found that CT scan of the chest showed right perihilar soft tissue with some associated right lower lobe obstruction.  She underwent nodal biopsies, transbronchial,  endobronchial biopsies that were negative for malignancy. She is on Anoro, co-pay is over $100. She is having some exertional SOB w walking. She is not using albuterol - doesn't have it anymore. She is wearing O2 at 2L/min at all times. She is due for a repeat CT chest in September to look for interval change.     Review of Systems   Past Medical History:  Diagnosis Date  . Anemia   . Aortic atherosclerosis ([place]) 12/03/2017   Noted on CT scan  . CAD (coronary artery disease), native coronary artery 12/03/2017   Noted on CT scan by calcification   . CHF (congestive heart failure) ([place])   . Chronic diastolic heart failure ([facility]) 12/03/2017  . Dyspnea   . GERD (gastroesophageal reflux disease)   . Heart murmur   . History of blood transfusion    "related to chemo" (12/05/2017)  . Hypertension   . Hypertensive heart disease 12/03/2017  . Kidney disease, chronic, stage III (GFR 30-59 ml/min) ([place]) 12/03/2017  . Lung cancer ([place]) dx'd 02/2010  . Mitral valve prolapse   . Morbid obesity ([place]) 12/03/2017  . On home oxygen therapy   . 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 Paroxysmal atrial fibrillation ([facility])   . Persistent atrial fibrillation ([place]) 12/03/2017  . Restrictive lung disease 05/23/2017     Family History  Problem Relation Age of Onset  . Breast cancer Mother   . Heart attack Mother   . Diabetes Father   . Heart attack Father   . Diabetes Brother   .  COPD Cousin   . Colon cancer Neg Hx      Social History   Socioeconomic History  . Marital status: Married    Spouse name: Not on file  . Number of children: Not on file  . Years of education: Not on file  . Highest education level: Not on file  Occupational History  . Not on file  Social Needs  . Financial resource strain: Not hard at all  . Food insecurity:    Worry: Patient refused    Inability: Patient refused  . Transportation needs:    Medical: Patient refused    Non-medical: Patient refused  Tobacco Use    . Smoking status: Former Smoker    Packs/day: 2.00    Years: 40.00    Pack years: 80.00    Types: Cigarettes    Last attempt to quit: 11/27/2001    Years since quitting: 16.6  . Smokeless tobacco: Never Used  Substance and Sexual Activity  . Alcohol use: Yes    Comment: 12/05/2017 "once in a great while I might have a drink"  . Drug use: No  . Sexual activity: Not Currently  Lifestyle  . Physical activity:    Days per week: 1 day    Minutes per session: 30 min  . Stress: Only a little  Relationships  . Social connections:    Talks on phone: Patient refused    Gets together: Patient refused    Attends religious service: Patient refused    Active member of club or organization: Patient refused    Attends meetings of clubs or organizations: Patient refused    Relationship status: Patient refused  . Intimate partner violence:    Fear of current or ex partner: Patient refused    Emotionally abused: Patient refused    Physically abused: Patient refused    Forced sexual activity: Patient refused  Other Topics Concern  . Not on file  Social History Narrative   [facility] (02/19/17):   Patient is originally from [facility]. She has also lived in [place]. She has worked primarily Veterinary surgeon. Has a dog currently. No bird or mold exposure.      Allergies  Allergen Reactions  . Septra Ds [Sulfamethoxazole-Trimethoprim] Nausea Only     Outpatient Medications Prior to Visit  Medication Sig Dispense Refill  . acetaminophen (TYLENOL) 500 MG tablet Take 1,000 mg by mouth at bedtime.    Marland Kitchen amiodarone (PACERONE) 200 MG tablet Take 1 tablet (200 mg total) by mouth daily. 90 tablet 1  . apixaban (ELIQUIS) 5 MG TABS tablet Take 1 tablet (5 mg total) by mouth 2 (two) times daily. OK to restart on Friday 05/10/18.    Marland Kitchen calcitRIOL (ROCALTROL) 0.25 MCG capsule Take 0.25 mcg by mouth every Monday, Wednesday, and Friday.    . carvedilol (COREG) 25 MG tablet Take 12.5 mg by mouth 2 (two) times daily with a  meal.     . hydrALAZINE (APRESOLINE) 25 MG tablet Take 25 mg by mouth 2 (two) times daily.    . hydroxypropyl methylcellulose / hypromellose (ISOPTO TEARS / GONIOVISC) 2.5 % ophthalmic solution Place 1 drop into both eyes 2 (two) times daily as needed for dry eyes.     . Na Sulfate-K Sulfate-Mg Sulf 17.5-3.13-1.6 GM/177ML SOLN Take 1 kit by mouth as directed. 354 mL 0  . polyethylene glycol (MIRALAX / GLYCOLAX) packet Take 17 g by mouth at bedtime.     . torsemide (DEMADEX) 20 MG tablet Take 2 tablets (40  mg total) by mouth 2 (two) times daily with a meal. 90 tablet 1  . umeclidinium-vilanterol (ANORO ELLIPTA) 62.5-25 MCG/INH AEPB Inhale 1 puff into the lungs daily. 1 each 5  . ferrous sulfate 325 (65 FE) MG tablet Take 325 mg by mouth 3 (three) times daily with meals.     Facility-Administered Medications Prior to Visit  Medication Dose Route Frequency Provider Last Rate Last Dose  . sodium chloride 0.9 % injection 10 mL  10 mL Intravenous PRN Curt Bears, MD   10 mL at 10/14/13 1030        Objective:   Physical Exam Vitals:   07/08/18 1046  BP: 130/82  Pulse: 83  SpO2: 93%  Weight: 256 lb (116.1 kg)  Height: '5\' 5"'$  (1.651 m)   Gen: Pleasant, well-nourished, in no distress,  normal affect  ENT: No lesions,  mouth clear,  oropharynx clear, no postnasal drip  Neck: No JVD, no stridor  Lungs: No use of accessory muscles, R sided insp and exp rhonchi. L clear  Cardiovascular: RRR, heart sounds normal, no murmur or gallops, trace peripheral edema  Musculoskeletal: No deformities, no cyanosis or clubbing  Neuro: alert, non focal  Skin: Warm, no lesions or rash     Assessment & Plan:  COPD (chronic obstructive pulmonary disease) (HCC) Slight progression of her dyspnea, ? Due to her obstructive disease.  Consider also some progression of her hilar adenopathy, right-sided postobstructive process.  She does have focal rhonchi on the right.  Unclear that she needs an inhaled  steroid.  We will plan to continue her Anoro.  Restart her albuterol to be used as needed, which she no longer has.   Cancer of right lung parenchyma (HCC) Biopsies of her hilar nodes, the right lower lobe airways and right lower lobe parenchyma were all negative for malignancy.  I am still concerned that she could have a recurrence that I have failed to diagnose.  She has a repeat CT scan of her chest planned for September.  We will review with Dr. [name] once available.  She may need further testing to plan therapy  Chronic respiratory failure with hypoxia (HCC) Continue oxygen at her current 2 L/min.  Baltazar Apo, MD, PhD 07/08/2018, 11:20 AM [facility] [phone] or if no answer [phone]

## 2018-07-08 NOTE — Patient Instructions (Signed)
Please continue your Anoro once a day.  Keep albuterol available to use 2 puffs up to every 4 hours if needed for shortness of breath, wheeze Please continue your oxygen as you have been using it Get your repeat Ct scan chest in September as planned  Follow with Dr [name] as planned.  Follow with Dr Lamonte Sakai in 3 months or sooner if you have any problems.

## 2018-07-08 NOTE — Assessment & Plan Note (Signed)
Continue oxygen at her current 2 L/min.

## 2018-07-15 ENCOUNTER — Encounter (HOSPITAL_COMMUNITY): Payer: Self-pay | Admitting: Emergency Medicine

## 2018-07-15 ENCOUNTER — Other Ambulatory Visit: Payer: Self-pay

## 2018-07-16 ENCOUNTER — Encounter (HOSPITAL_COMMUNITY): Payer: Self-pay | Admitting: *Deleted

## 2018-07-16 ENCOUNTER — Other Ambulatory Visit: Payer: Self-pay

## 2018-07-16 ENCOUNTER — Ambulatory Visit (HOSPITAL_COMMUNITY)
Admission: RE | Admit: 2018-07-16 | Discharge: 2018-07-16 | Disposition: A | Discharge status: Home / Self Care | Payer: Medicare Other | Source: Ambulatory Visit | Attending: Gastroenterology | Admitting: Gastroenterology

## 2018-07-16 ENCOUNTER — Ambulatory Visit (HOSPITAL_COMMUNITY): Payer: Medicare Other | Admitting: Anesthesiology

## 2018-07-16 ENCOUNTER — Encounter (HOSPITAL_COMMUNITY)
Admission: RE | Disposition: A | Discharge status: Home / Self Care | Payer: Self-pay | Source: Ambulatory Visit | Attending: Gastroenterology

## 2018-07-16 DIAGNOSIS — D123 Benign neoplasm of transverse colon: Secondary | ICD-10-CM | POA: Insufficient documentation

## 2018-07-16 DIAGNOSIS — Z79899 Other long term (current) drug therapy: Secondary | ICD-10-CM | POA: Diagnosis not present

## 2018-07-16 DIAGNOSIS — Z87891 Personal history of nicotine dependence: Secondary | ICD-10-CM | POA: Insufficient documentation

## 2018-07-16 DIAGNOSIS — D509 Iron deficiency anemia, unspecified: Secondary | ICD-10-CM | POA: Insufficient documentation

## 2018-07-16 DIAGNOSIS — I481 Persistent atrial fibrillation: Secondary | ICD-10-CM | POA: Diagnosis not present

## 2018-07-16 DIAGNOSIS — N183 Chronic kidney disease, stage 3 (moderate): Secondary | ICD-10-CM | POA: Insufficient documentation

## 2018-07-16 DIAGNOSIS — Z9981 Dependence on supplemental oxygen: Secondary | ICD-10-CM | POA: Insufficient documentation

## 2018-07-16 DIAGNOSIS — D649 Anemia, unspecified: Secondary | ICD-10-CM | POA: Diagnosis not present

## 2018-07-16 DIAGNOSIS — C801 Malignant (primary) neoplasm, unspecified: Secondary | ICD-10-CM | POA: Insufficient documentation

## 2018-07-16 DIAGNOSIS — I5032 Chronic diastolic (congestive) heart failure: Secondary | ICD-10-CM | POA: Diagnosis not present

## 2018-07-16 DIAGNOSIS — D122 Benign neoplasm of ascending colon: Secondary | ICD-10-CM | POA: Insufficient documentation

## 2018-07-16 DIAGNOSIS — I130 Hypertensive heart and chronic kidney disease with heart failure and stage 1 through stage 4 chronic kidney disease, or unspecified chronic kidney disease: Secondary | ICD-10-CM | POA: Insufficient documentation

## 2018-07-16 DIAGNOSIS — K648 Other hemorrhoids: Secondary | ICD-10-CM | POA: Diagnosis not present

## 2018-07-16 DIAGNOSIS — I739 Peripheral vascular disease, unspecified: Secondary | ICD-10-CM | POA: Diagnosis not present

## 2018-07-16 DIAGNOSIS — Q438 Other specified congenital malformations of intestine: Secondary | ICD-10-CM | POA: Insufficient documentation

## 2018-07-16 DIAGNOSIS — J449 Chronic obstructive pulmonary disease, unspecified: Secondary | ICD-10-CM | POA: Diagnosis not present

## 2018-07-16 DIAGNOSIS — C7800 Secondary malignant neoplasm of unspecified lung: Secondary | ICD-10-CM | POA: Insufficient documentation

## 2018-07-16 DIAGNOSIS — Z7901 Long term (current) use of anticoagulants: Secondary | ICD-10-CM | POA: Diagnosis not present

## 2018-07-16 DIAGNOSIS — I2510 Atherosclerotic heart disease of native coronary artery without angina pectoris: Secondary | ICD-10-CM | POA: Diagnosis not present

## 2018-07-16 DIAGNOSIS — K210 Gastro-esophageal reflux disease with esophagitis: Secondary | ICD-10-CM | POA: Insufficient documentation

## 2018-07-16 DIAGNOSIS — D124 Benign neoplasm of descending colon: Secondary | ICD-10-CM | POA: Diagnosis not present

## 2018-07-16 DIAGNOSIS — K621 Rectal polyp: Secondary | ICD-10-CM

## 2018-07-16 DIAGNOSIS — Z6841 Body Mass Index (BMI) 40.0 and over, adult: Secondary | ICD-10-CM | POA: Insufficient documentation

## 2018-07-16 DIAGNOSIS — D120 Benign neoplasm of cecum: Secondary | ICD-10-CM | POA: Diagnosis not present

## 2018-07-16 DIAGNOSIS — K635 Polyp of colon: Secondary | ICD-10-CM

## 2018-07-16 DIAGNOSIS — Z85118 Personal history of other malignant neoplasm of bronchus and lung: Secondary | ICD-10-CM | POA: Insufficient documentation

## 2018-07-16 DIAGNOSIS — R195 Other fecal abnormalities: Secondary | ICD-10-CM

## 2018-07-16 HISTORY — PX: BIOPSY: SHX5522

## 2018-07-16 HISTORY — PX: COLONOSCOPY WITH PROPOFOL: SHX5780

## 2018-07-16 HISTORY — PX: ESOPHAGOGASTRODUODENOSCOPY (EGD) WITH PROPOFOL: SHX5813

## 2018-07-16 HISTORY — PX: POLYPECTOMY: SHX5525

## 2018-07-16 LAB — POCT I-STAT 4, (NA,K, GLUC, HGB,HCT)
Glucose, Bld: 97 mg/dL (ref 70–99)
HCT: 34 % — ABNORMAL LOW (ref 36.0–46.0)
Hemoglobin: 11.6 g/dL — ABNORMAL LOW (ref 12.0–15.0)
Potassium: 3.6 mmol/L (ref 3.5–5.1)
Sodium: 139 mmol/L (ref 135–145)

## 2018-07-16 SURGERY — COLONOSCOPY WITH PROPOFOL
Anesthesia: Monitor Anesthesia Care

## 2018-07-16 MED ORDER — PROPOFOL 10 MG/ML IV BOLUS
INTRAVENOUS | Status: AC
  Filled 2018-07-16: qty 20

## 2018-07-16 MED ORDER — LACTATED RINGERS IV SOLN
INTRAVENOUS | Status: DC
  Administered 2018-07-16: 09:00:00 via INTRAVENOUS

## 2018-07-16 MED ORDER — PROPOFOL 500 MG/50ML IV EMUL
INTRAVENOUS | Status: DC | PRN
  Administered 2018-07-16: 50 mg via INTRAVENOUS

## 2018-07-16 MED ORDER — PROPOFOL 10 MG/ML IV BOLUS
INTRAVENOUS | Status: AC
  Filled 2018-07-16: qty 40

## 2018-07-16 MED ORDER — LIDOCAINE HCL (CARDIAC) PF 100 MG/5ML IV SOSY
PREFILLED_SYRINGE | INTRAVENOUS | Status: DC | PRN
  Administered 2018-07-16: 40 mg via INTRAVENOUS

## 2018-07-16 MED ORDER — PHENYLEPHRINE HCL 10 MG/ML IJ SOLN
INTRAMUSCULAR | Status: DC | PRN
  Administered 2018-07-16: 80 ug via INTRAVENOUS

## 2018-07-16 MED ORDER — SODIUM CHLORIDE 0.9 % IV SOLN: INTRAVENOUS | Status: DC

## 2018-07-16 MED ORDER — PROPOFOL 500 MG/50ML IV EMUL
INTRAVENOUS | Status: DC | PRN
  Administered 2018-07-16: 150 ug/kg/min via INTRAVENOUS

## 2018-07-16 SURGICAL SUPPLY — 24 items

## 2018-07-16 NOTE — Op Note (Signed)
[facility] Patient Name: Diane [name] Procedure Date: 07/16/2018 MRN: [id number] Attending MD: Mauri Pole , MD Date of Birth: February 12, 1941 CSN: [id number] Age: 77 Admit Type: Outpatient Procedure:                Upper GI endoscopy Indications:              Gastrointestinal bleeding of unknown origin,                            Suspected upper gastrointestinal bleeding in                            patient with unexplained iron deficiency anemia Providers:                Mauri Pole, MD, Cleda Daub, RN, Cletis Athens, Technician, Rosario Adie, CRNA Referring MD:              Medicines:                Monitored Anesthesia Care Complications:            No immediate complications. Estimated Blood Loss:     Estimated blood loss: none. Procedure:                Pre-Anesthesia Assessment:                           - Prior to the procedure, a History and Physical                            was performed, and patient medications and                            allergies were reviewed. The patient's tolerance of                            previous anesthesia was also reviewed. The risks                            and benefits of the procedure and the sedation                            options and risks were discussed with the patient.                            All questions were answered, and informed consent                            was obtained. Prior Anticoagulants: The patient                            last took Eliquis (apixaban) 2 days prior to the  procedure. ASA Grade Assessment: III - A patient                            with severe systemic disease. After reviewing the                            risks and benefits, the patient was deemed in                            satisfactory condition to undergo the procedure.                           After obtaining informed consent, the endoscope  was                            passed under direct vision. Throughout the                            procedure, the patient's blood pressure, pulse, and                            oxygen saturations were monitored continuously. The                            [id number]-H190 ([id number]) Olympus adult endoscope was                            introduced through the mouth, and advanced to the                            second part of duodenum. The upper GI endoscopy was                            accomplished without difficulty. The patient                            tolerated the procedure well. Scope In: Scope Out: Findings:      LA Grade A (one or more mucosal breaks less than 5 mm, not extending       between tops of 2 mucosal folds) esophagitis with no bleeding was found       34 to 36 cm from the incisors.      The stomach was normal.      The examined duodenum was normal. Impression:               - LA Grade A reflux esophagitis.                           - Normal stomach.                           - Normal examined duodenum.                           - No specimens collected. Moderate Sedation:  N/A- Per Anesthesia Care Recommendation:           - Patient has a contact number available for                            emergencies. The signs and symptoms of potential                            delayed complications were discussed with the                            patient. Return to normal activities tomorrow.                            Written discharge instructions were provided to the                            patient.                           - Resume previous diet.                           - Continue present medications.                           - See the other procedure note for documentation of                            additional recommendations. Procedure Code(s):        --- Professional ---                           [phone], Esophagogastroduodenoscopy, flexible,                             transoral; diagnostic, including collection of                            specimen(s) by brushing or washing, when performed                            (separate procedure) Diagnosis Code(s):        --- Professional ---                           K21.0, Gastro-esophageal reflux disease with                            esophagitis                           K92.2, Gastrointestinal hemorrhage, unspecified                           D50.9, Iron deficiency anemia, unspecified CPT copyright 2017 American Medical Association. All rights reserved. The codes documented in this report are preliminary and upon coder review may  be revised to meet current compliance requirements. Mauri Pole, MD 07/16/2018 10:59:59 AM This report has been signed electronically. Number of Addenda: 0

## 2018-07-16 NOTE — H&P (Signed)
[facility] History and Physical   Primary Care Physician:  [name], MD   Reason for Procedure:  Heme positive stool  Plan:    EGD and colonoscopy with interventions     HPI: Diane [name] is a 77 y.o. female with CKD, lung ca, COPD on home O2 here for evaluation of anemia . No melena or hematochezia. Has fatigue and SOB with anemia.  Last colonoscopy about 20 years ago.  The risks and benefits as well as alternatives of endoscopic procedure(s) have been discussed and reviewed. All questions answered. The patient agrees to proceed.    Past Medical History:  Diagnosis Date  . Anemia   . Aortic atherosclerosis ([place]) 12/03/2017   Noted on CT scan  . CAD (coronary artery disease), native coronary artery 12/03/2017   Noted on CT scan by calcification   . CHF (congestive heart failure) ([place])   . Chronic diastolic heart failure ([place]) 12/03/2017  . Dyspnea   . GERD (gastroesophageal reflux disease)   . Heart murmur   . History of blood transfusion    "related to chemo" (12/05/2017)  . Hypertension   . Hypertensive heart disease 12/03/2017  . Kidney disease, chronic, stage III (GFR 30-59 ml/min) ([place]) 12/03/2017  . Lung cancer ([place]) dx'd 02/2010  . Mitral valve prolapse   . Morbid obesity ([place]) 12/03/2017  . On home oxygen therapy   . 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 Paroxysmal atrial fibrillation ([facility])   . Persistent atrial fibrillation ([place]) 12/03/2017  . Restrictive lung disease 05/23/2017    Past Surgical History:  Procedure Laterality Date  . CARDIOVERSION N/A 12/06/2017   Procedure: CARDIOVERSION;  Surgeon: Jacolyn Reedy, MD;  Location: [facility] ENDOSCOPY;  Service: Cardiovascular;  Laterality: N/A;  . CATARACT EXTRACTION W/ INTRAOCULAR LENS  IMPLANT, BILATERAL Bilateral   . DERMABRASION  ~ 1966   "face; for acne"  . PAROTID GLAND TUMOR EXCISION Left   . PORTACATH PLACEMENT  11/03/08-[place]   tip in mid SVC-[place]. [place]  . THORACENTESIS  Right   . TONSILLECTOMY    . VIDEO BRONCHOSCOPY WITH ENDOBRONCHIAL ULTRASOUND Right 05/08/2018   Procedure: VIDEO BRONCHOSCOPY WITH ENDOBRONCHIAL ULTRASOUND;  Surgeon: Collene Gobble, MD;  Location: [facility];  Service: Thoracic;  Laterality: Right;    Prior to Admission medications   Medication Sig Start Date End Date Taking? Authorizing Provider  acetaminophen (TYLENOL) 500 MG tablet Take 1,000 mg by mouth at bedtime.   Yes [provider]  albuterol (PROAIR HFA) 108 (90 Base) MCG/ACT inhaler Inhale 2 puffs into the lungs every 4 (four) hours as needed for wheezing or shortness of breath. 07/08/18  Yes Collene Gobble, MD  amiodarone (PACERONE) 200 MG tablet Take 1 tablet (200 mg total) by mouth daily. 07/03/18  Yes Leonie Man, MD  apixaban (ELIQUIS) 5 MG TABS tablet Take 1 tablet (5 mg total) by mouth 2 (two) times daily. OK to restart on Friday 05/10/18. 05/08/18  Yes Collene Gobble, MD  calcitRIOL (ROCALTROL) 0.25 MCG capsule Take 0.25 mcg by mouth every Monday, Wednesday, and Friday. 06/25/12  Yes [provider]  carvedilol (COREG) 25 MG tablet Take 12.5 mg by mouth 2 (two) times daily with a meal.  10/22/17  Yes [provider]  hydrALAZINE (APRESOLINE) 25 MG tablet Take 25 mg by mouth 2 (two) times daily.   Yes [provider]  hydroxypropyl methylcellulose / hypromellose (ISOPTO TEARS / GONIOVISC) 2.5 % ophthalmic solution Place 1 drop  into both eyes 2 (two) times daily as needed for dry eyes.    Yes [provider]  Melatonin 10 MG TABS Take 10 mg by mouth daily.   Yes [provider]  polyethylene glycol (MIRALAX / GLYCOLAX) packet Take 17 g by mouth at bedtime.    Yes [provider]  torsemide (DEMADEX) 20 MG tablet Take 2 tablets (40 mg total) by mouth 2 (two) times daily with a meal. 07/03/18  Yes Leonie Man, MD  umeclidinium-vilanterol (ANORO ELLIPTA) 62.5-25 MCG/INH AEPB Inhale 1 puff into the lungs daily. 01/17/18   Yes Parrett, Tammy S, NP  vitamin B-12 (CYANOCOBALAMIN) 250 MCG tablet Take 250 mcg by mouth daily.   Yes [provider]    Current Facility-Administered Medications  Medication Dose Route Frequency Provider Last Rate Last Dose  . 0.9 %  sodium chloride infusion   Intravenous Continuous Zehr, Jessica D, PA-C      . lactated ringers infusion   Intravenous Continuous Vetta Couzens, Venia Minks, MD       Facility-Administered Medications Ordered in Other Encounters  Medication Dose Route Frequency Provider Last Rate Last Dose  . sodium chloride 0.9 % injection 10 mL  10 mL Intravenous PRN Curt Bears, MD   10 mL at 10/14/13 1030    Allergies as of 06/11/2018 - Review Complete 06/11/2018  Allergen Reaction Noted  . Septra ds [sulfamethoxazole-trimethoprim] Nausea Only 07/14/2014    Family History  Problem Relation Age of Onset  . Breast cancer Mother   . Heart attack Mother   . Diabetes Father   . Heart attack Father   . Diabetes Brother   . COPD Cousin   . Colon cancer Neg Hx     Social History   Socioeconomic History  . Marital status: Married    Spouse name: Not on file  . Number of children: Not on file  . Years of education: Not on file  . Highest education level: Not on file  Occupational History  . Not on file  Social Needs  . Financial resource strain: Not hard at all  . Food insecurity:    Worry: Patient refused    Inability: Patient refused  . Transportation needs:    Medical: Patient refused    Non-medical: Patient refused  Tobacco Use  . Smoking status: Former Smoker    Packs/day: 2.00    Years: 40.00    Pack years: 80.00    Types: Cigarettes    Last attempt to quit: 11/27/2001    Years since quitting: 16.6  . Smokeless tobacco: Never Used  Substance and Sexual Activity  . Alcohol use: Yes    Comment: 12/05/2017 "once in a great while I might have a drink"  . Drug use: No  . Sexual activity: Not Currently  Lifestyle  . Physical activity:     Days per week: 1 day    Minutes per session: 30 min  . Stress: Only a little  Relationships  . Social connections:    Talks on phone: Patient refused    Gets together: Patient refused    Attends religious service: Patient refused    Active member of club or organization: Patient refused    Attends meetings of clubs or organizations: Patient refused    Relationship status: Patient refused  . Intimate partner violence:    Fear of current or ex partner: Patient refused    Emotionally abused: Patient refused    Physically abused: Patient refused  Forced sexual activity: Patient refused  Other Topics Concern  . Not on file  Social History Narrative   [facility] (02/19/17):   Patient is originally from [facility] She has also lived in [place]. She has worked primarily Veterinary surgeon. Has a dog currently. No bird or mold exposure.     Review of Systems:  All other review of systems negative except as mentioned in the HPI.  Physical Exam: Vital signs in last 24 hours: Temp:  [98.2 F (36.8 C)] 98.2 F (36.8 C) (08/20 0922) Pulse Rate:  [80] 80 (08/20 0922) Resp:  [18] 18 (08/20 0922) BP: (207)/(67) 207/67 (08/20 0922) SpO2:  [100 %] 100 % (08/20 0922) Weight:  [113.4 kg] 113.4 kg (08/20 0922)   General:   Alert,  Well-developed, well-nourished, pleasant and cooperative in NAD Lungs:  Clear on O2 Heart:  Regular rate and rhythm; no murmurs, clicks, rubs,  or gallops. Abdomen:  Soft, nontender and nondistended. Normal bowel sounds.   Neuro/Psych:  Alert and cooperative. Normal mood and affect. A and O x 3   K. Denzil Magnuson , MD [phone]

## 2018-07-16 NOTE — Anesthesia Procedure Notes (Signed)
Date/Time: 07/16/2018 9:56 AM Performed by: Glory Buff, CRNA Oxygen Delivery Method: Nasal cannula

## 2018-07-16 NOTE — Op Note (Signed)
[facility] Patient Name: Diane [name] Procedure Date: 07/16/2018 MRN: [id number] Attending MD: Mauri Pole , MD Date of Birth: February 18, 1941 CSN: [id number] Age: 77 Admit Type: Outpatient Procedure:                Colonoscopy Indications:              Evaluation of unexplained GI bleeding, Unexplained                            iron deficiency anemia Providers:                Mauri Pole, MD, Cleda Daub, RN, Cletis Athens, Technician, Rosario Adie, CRNA Referring MD:              Medicines:                Monitored Anesthesia Care Complications:            No immediate complications. Estimated Blood Loss:     Estimated blood loss was minimal. Procedure:                Pre-Anesthesia Assessment:                           - Prior to the procedure, a History and Physical                            was performed, and patient medications and                            allergies were reviewed. The patient's tolerance of                            previous anesthesia was also reviewed. The risks                            and benefits of the procedure and the sedation                            options and risks were discussed with the patient.                            All questions were answered, and informed consent                            was obtained. Prior Anticoagulants: The patient                            last took Eliquis (apixaban) 2 days prior to the                            procedure. ASA Grade Assessment: III - A patient  with severe systemic disease. After reviewing the                            risks and benefits, the patient was deemed in                            satisfactory condition to undergo the procedure.                           After obtaining informed consent, the colonoscope                            was passed under direct vision. Throughout the             procedure, the patient's blood pressure, pulse, and                            oxygen saturations were monitored continuously. The                            [id number]-[id number] ([id number] Olympus peds colonscope was                            introduced through the anus and advanced to the the                            cecum, identified by appendiceal orifice and                            ileocecal valve. The colonoscopy was somewhat                            difficult due to a redundant colon. The patient                            tolerated the procedure well. The quality of the                            bowel preparation was adequate. The ileocecal                            valve, appendiceal orifice, and rectum were                            photographed. Scope In: 10:15:25 AM Scope Out: 10:54:19 AM Scope Withdrawal Time: 0 hours 25 minutes 31 seconds  Total Procedure Duration: 0 hours 38 minutes 54 seconds  Findings:      The perianal and digital rectal examinations were normal.      Two sessile polyps were found in the ascending colon and cecum. The       polyps were 4 to 5 mm in size. These polyps were removed with a cold       snare. Resection and retrieval were complete. Significant oozing noted       post polypectomy, stopped by end of procedure  Two sessile polyps were found in the transverse colon and cecum. The       polyps were 4 to 5 mm in size. These polyps were removed with a hot       snare. Resection and retrieval were complete.      Four sessile polyps were found in the rectum, descending colon and       ascending colon. The polyps were 1 to 2 mm in size. These polyps were       removed with a cold biopsy forceps. Resection and retrieval were       complete.      Mild erythema, patchy in sigmoid and descending colon, likely prep       related. No evidence of AVM      Non-bleeding internal hemorrhoids were found during retroflexion. The       hemorrhoids  were medium-sized. Impression:               - Two 4 to 5 mm polyps in the transverse colon and                            in the cecum, removed with a hot snare. Resected                            and retrieved.                           - Two 4 to 5 mm polyps in the ascending colon and                            in the cecum, removed with a cold snare. Resected                            and retrieved.                           - Four 1 to 2 mm polyps in the rectum, in the                            descending colon and in the ascending colon,                            removed with a cold biopsy forceps. Resected and                            retrieved.                           - Non-bleeding internal hemorrhoids. Moderate Sedation:      N/A- Per Anesthesia Care Recommendation:           - Patient has a contact number available for                            emergencies. The signs and symptoms of potential  delayed complications were discussed with the                            patient. Return to normal activities tomorrow.                            Written discharge instructions were provided to the                            patient.                           - Resume previous diet.                           - Continue present medications.                           - Await pathology results.                           - No repeat colonoscopy due to age.                           - Vitamin B12 1048mcg injection weekly X 4 followed                            by monthly                           - Oral Ferrous sulphate 325mg  three times daily                            with meals                           - Recheck CBC, B12 and iron panel in 2 months. If                            has persistent anemia or develops melena, will                            consider small bowel video capsule for further                            evaluation                            - Avoid NSAID's                           - Resume Eliquis (apixaban) at prior dose tomorrow.                            Refer to managing physician for further adjustment  of therapy. Procedure Code(s):        --- Professional ---                           [phone], Colonoscopy, flexible; with removal of                            tumor(s), polyp(s), or other lesion(s) by snare                            technique                           [id number], 47, Colonoscopy, flexible; with biopsy,                            single or multiple Diagnosis Code(s):        --- Professional ---                           D12.3, Benign neoplasm of transverse colon (hepatic                            flexure or splenic flexure)                           D12.0, Benign neoplasm of cecum                           K62.1, Rectal polyp                           D12.4, Benign neoplasm of descending colon                           D12.2, Benign neoplasm of ascending colon                           K64.8, Other hemorrhoids                           K92.2, Gastrointestinal hemorrhage, unspecified                           D50.9, Iron deficiency anemia, unspecified CPT copyright 2017 American Medical Association. All rights reserved. The codes documented in this report are preliminary and upon coder review may  be revised to meet current compliance requirements. Mauri Pole, MD 07/16/2018 11:09:39 AM This report has been signed electronically. Number of Addenda: 0

## 2018-07-16 NOTE — Anesthesia Postprocedure Evaluation (Signed)
Anesthesia Post Note  Patient: Diane [name] [facility]  Procedure(s) Performed: COLONOSCOPY WITH PROPOFOL (N/A ) ESOPHAGOGASTRODUODENOSCOPY (EGD) WITH PROPOFOL (N/A ) POLYPECTOMY BIOPSY     Patient location during evaluation: Endoscopy Anesthesia Type: MAC Level of consciousness: awake Pain management: pain level controlled Vital Signs Assessment: post-procedure vital signs reviewed and stable Respiratory status: spontaneous breathing Cardiovascular status: stable Postop Assessment: adequate PO intake Anesthetic complications: no    Last Vitals:  Vitals:   07/16/18 1110 07/16/18 1120  BP: (!) 151/48 (!) 193/64  Pulse: 83 80  Resp: 16 14  Temp:    SpO2: 100% 100%    Last Pain:  Vitals:   07/16/18 1100  TempSrc: Oral  PainSc: 0-No pain                 Ruffus Kamaka

## 2018-07-16 NOTE — Transfer of Care (Signed)
Immediate Anesthesia Transfer of Care Note  Patient: Diane [name] [facility]  Procedure(s) Performed: COLONOSCOPY WITH PROPOFOL (N/A ) ESOPHAGOGASTRODUODENOSCOPY (EGD) WITH PROPOFOL (N/A ) POLYPECTOMY BIOPSY  Patient Location: PACU  Anesthesia Type:MAC  Level of Consciousness: drowsy and patient cooperative  Airway & Oxygen Therapy: Patient Spontanous Breathing and Patient connected to nasal cannula oxygen  Post-op Assessment: Report given to RN and Post -op Vital signs reviewed and stable  Post vital signs: Reviewed and stable  Last Vitals:  Vitals Value Taken Time  BP    Temp    Pulse 80 07/16/2018 11:01 AM  Resp 23 07/16/2018 11:01 AM  SpO2 99 % 07/16/2018 11:01 AM  Vitals shown include unvalidated device data.  Last Pain:  Vitals:   07/16/18 0922  TempSrc: Oral  PainSc: 0-No pain         Complications: No apparent anesthesia complications

## 2018-07-16 NOTE — Anesthesia Preprocedure Evaluation (Signed)
Anesthesia Evaluation  Patient identified by MRN, date of birth, ID band Patient awake    Airway Mallampati: II  TM Distance: >3 FB     Dental   Pulmonary shortness of breath, COPD, former smoker,    breath sounds clear to auscultation       Cardiovascular hypertension, + CAD, + Peripheral Vascular Disease and +CHF  + Valvular Problems/Murmurs  Rhythm:Regular Rate:Normal     Neuro/Psych    GI/Hepatic Neg liver ROS, GERD  ,  Endo/Other    Renal/GU Renal disease     Musculoskeletal   Abdominal   Peds  Hematology  (+) anemia ,   Anesthesia Other Findings   Reproductive/Obstetrics                             Anesthesia Physical Anesthesia Plan  ASA: III  Anesthesia Plan: MAC   Post-op Pain Management:    Induction: Intravenous  PONV Risk Score and Plan: Treatment may vary due to age or medical condition  Airway Management Planned: Simple Face Mask  Additional Equipment:   Intra-op Plan:   Post-operative Plan:   Informed Consent: I have reviewed the patients History and Physical, chart, labs and discussed the procedure including the risks, benefits and alternatives for the proposed anesthesia with the patient or authorized representative who has indicated his/her understanding and acceptance.   Dental advisory given  Plan Discussed with: CRNA and Anesthesiologist  Anesthesia Plan Comments:         Anesthesia Quick Evaluation

## 2018-07-16 NOTE — Discharge Instructions (Signed)

## 2018-07-17 ENCOUNTER — Telehealth: Payer: Self-pay | Admitting: Gastroenterology

## 2018-07-17 ENCOUNTER — Encounter (HOSPITAL_COMMUNITY): Payer: Self-pay | Admitting: Gastroenterology

## 2018-07-17 NOTE — Telephone Encounter (Signed)
Spoke with the patient about this. It is easier for her to go to her PCP for the B12 injections. She is a patient of Dr Claris Gower. I have contacted his office and faxed the procedure reports.

## 2018-07-23 ENCOUNTER — Encounter: Payer: Self-pay | Admitting: Gastroenterology

## 2018-07-24 ENCOUNTER — Other Ambulatory Visit: Payer: Self-pay | Admitting: Medical Oncology

## 2018-07-24 DIAGNOSIS — C3490 Malignant neoplasm of unspecified part of unspecified bronchus or lung: Secondary | ICD-10-CM

## 2018-07-25 ENCOUNTER — Telehealth: Payer: Self-pay

## 2018-07-25 NOTE — Telephone Encounter (Signed)
Left a detailed message concerning her request for a flush on 9/23 was added to her current schedul. poer 8/29 phone msg return

## 2018-07-26 ENCOUNTER — Encounter

## 2018-07-26 ENCOUNTER — Ambulatory Visit: Payer: Medicare Other | Admitting: Gastroenterology

## 2018-07-31 ENCOUNTER — Telehealth: Payer: Self-pay | Admitting: Cardiology

## 2018-07-31 MED ORDER — TORSEMIDE 20 MG PO TABS: 40.0000 mg | ORAL_TABLET | Freq: Two times a day (BID) | ORAL | 1 refills | Status: DC

## 2018-07-31 NOTE — Telephone Encounter (Signed)
New message   *STAT* If patient is at the pharmacy, call can be transferred to refill team.   1. Which medications need to be refilled? (please list name of each medication and dose if known) torsemide (DEMADEX) 20 MG tablet  2. Which pharmacy/location (including street and city if local pharmacy) is medication to be sent to?[place], [address]  3. Do they need a 30 day or 90 day supply? [place]

## 2018-08-19 ENCOUNTER — Inpatient Hospital Stay: Payer: Medicare Other

## 2018-08-19 ENCOUNTER — Ambulatory Visit (HOSPITAL_COMMUNITY)
Admission: RE | Admit: 2018-08-19 | Discharge: 2018-08-19 | Disposition: A | Discharge status: Home / Self Care | Payer: Medicare Other | Source: Ambulatory Visit | Attending: Internal Medicine | Admitting: Internal Medicine

## 2018-08-19 ENCOUNTER — Inpatient Hospital Stay: Payer: Medicare Other | Attending: Internal Medicine

## 2018-08-19 VITALS — BP 185/80 | HR 81 | Temp 97.7°F | Resp 18

## 2018-08-19 DIAGNOSIS — C3490 Malignant neoplasm of unspecified part of unspecified bronchus or lung: Secondary | ICD-10-CM

## 2018-08-19 DIAGNOSIS — Z85118 Personal history of other malignant neoplasm of bronchus and lung: Secondary | ICD-10-CM | POA: Diagnosis present

## 2018-08-19 DIAGNOSIS — Z9981 Dependence on supplemental oxygen: Secondary | ICD-10-CM | POA: Diagnosis not present

## 2018-08-19 DIAGNOSIS — Z9221 Personal history of antineoplastic chemotherapy: Secondary | ICD-10-CM | POA: Diagnosis not present

## 2018-08-19 DIAGNOSIS — I2510 Atherosclerotic heart disease of native coronary artery without angina pectoris: Secondary | ICD-10-CM | POA: Diagnosis not present

## 2018-08-19 DIAGNOSIS — I700 Atherosclerosis of aorta: Secondary | ICD-10-CM | POA: Diagnosis not present

## 2018-08-19 DIAGNOSIS — Z923 Personal history of irradiation: Secondary | ICD-10-CM | POA: Diagnosis not present

## 2018-08-19 DIAGNOSIS — J432 Centrilobular emphysema: Secondary | ICD-10-CM | POA: Diagnosis not present

## 2018-08-19 DIAGNOSIS — I080 Rheumatic disorders of both mitral and aortic valves: Secondary | ICD-10-CM | POA: Insufficient documentation

## 2018-08-19 DIAGNOSIS — D509 Iron deficiency anemia, unspecified: Secondary | ICD-10-CM | POA: Diagnosis not present

## 2018-08-19 DIAGNOSIS — J438 Other emphysema: Secondary | ICD-10-CM | POA: Insufficient documentation

## 2018-08-19 DIAGNOSIS — R918 Other nonspecific abnormal finding of lung field: Secondary | ICD-10-CM | POA: Diagnosis not present

## 2018-08-19 DIAGNOSIS — Z95828 Presence of other vascular implants and grafts: Secondary | ICD-10-CM

## 2018-08-19 DIAGNOSIS — J449 Chronic obstructive pulmonary disease, unspecified: Secondary | ICD-10-CM | POA: Insufficient documentation

## 2018-08-19 DIAGNOSIS — C3491 Malignant neoplasm of unspecified part of right bronchus or lung: Secondary | ICD-10-CM

## 2018-08-19 LAB — CMP (CANCER CENTER ONLY)
ALT: 9 U/L (ref 0–44)
ANION GAP: 11 (ref 5–15)
AST: 13 U/L — ABNORMAL LOW (ref 15–41)
Albumin: 3.3 g/dL — ABNORMAL LOW (ref 3.5–5.0)
Alkaline Phosphatase: 60 U/L (ref 38–126)
BILIRUBIN TOTAL: 0.6 mg/dL (ref 0.3–1.2)
BUN: 37 mg/dL — AB (ref 8–23)
CO2: 29 mmol/L (ref 22–32)
Calcium: 9.3 mg/dL (ref 8.9–10.3)
Chloride: 100 mmol/L (ref 98–111)
Creatinine: 2.06 mg/dL — ABNORMAL HIGH (ref 0.44–1.00)
GFR, EST NON AFRICAN AMERICAN: 22 mL/min — AB (ref >60)
GFR, Est AFR Am: 25 mL/min — ABNORMAL LOW (ref >60)
Glucose, Bld: 104 mg/dL — ABNORMAL HIGH (ref 70–99)
POTASSIUM: 4.3 mmol/L (ref 3.5–5.1)
Sodium: 140 mmol/L (ref 135–145)
TOTAL PROTEIN: 6.7 g/dL (ref 6.5–8.1)

## 2018-08-19 LAB — CBC WITH DIFFERENTIAL (CANCER CENTER ONLY)
BASOS PCT: 1 %
Basophils Absolute: 0 10*3/uL (ref 0.0–0.1)
EOS PCT: 4 %
Eosinophils Absolute: 0.2 10*3/uL (ref 0.0–0.5)
HEMATOCRIT: 34.7 % — AB (ref 34.8–46.6)
HEMOGLOBIN: 10.9 g/dL — AB (ref 11.6–15.9)
LYMPHS PCT: 10 %
Lymphs Abs: 0.5 10*3/uL — ABNORMAL LOW (ref 0.9–3.3)
MCH: 30.9 pg (ref 25.1–34.0)
MCHC: 31.4 g/dL — ABNORMAL LOW (ref 31.5–36.0)
MCV: 98.3 fL (ref 79.5–101.0)
Monocytes Absolute: 0.7 10*3/uL (ref 0.1–0.9)
Monocytes Relative: 13 %
Neutro Abs: 3.8 10*3/uL (ref 1.5–6.5)
Neutrophils Relative %: 72 %
Platelet Count: 137 10*3/uL — ABNORMAL LOW (ref 145–400)
RBC: 3.53 MIL/uL — ABNORMAL LOW (ref 3.70–5.45)
RDW: 14.1 % (ref 11.2–14.5)
WBC: 5.2 10*3/uL (ref 3.9–10.3)

## 2018-08-19 MED ORDER — SODIUM CHLORIDE 0.9 % IJ SOLN
10.0000 mL | INTRAMUSCULAR | Status: DC | PRN
  Administered 2018-08-19: 10 mL via INTRAVENOUS
  Filled 2018-08-19: qty 10

## 2018-08-19 MED ORDER — HEPARIN SOD (PORK) LOCK FLUSH 100 UNIT/ML IV SOLN
500.0000 [IU] | Freq: Once | INTRAVENOUS | Status: AC | PRN
  Administered 2018-08-19: 500 [IU] via INTRAVENOUS
  Filled 2018-08-19: qty 5

## 2018-08-19 NOTE — Patient Instructions (Signed)
Implanted Port Home Guide An implanted port is a type of central line that is placed under the skin. Central lines are used to provide IV access when treatment or nutrition needs to be given through a person's veins. Implanted ports are used for long-term IV access. An implanted port may be placed because:  You need IV medicine that would be irritating to the small veins in your hands or arms.  You need long-term IV medicines, such as antibiotics.  You need IV nutrition for a long period.  You need frequent blood draws for lab tests.  You need dialysis.  Implanted ports are usually placed in the chest area, but they can also be placed in the upper arm, the abdomen, or the leg. An implanted port has two main parts:  Reservoir. The reservoir is round and will appear as a small, raised area under your skin. The reservoir is the part where a needle is inserted to give medicines or draw blood.  Catheter. The catheter is a thin, flexible tube that extends from the reservoir. The catheter is placed into a large vein. Medicine that is inserted into the reservoir goes into the catheter and then into the vein.  How will I care for my incision site? Do not get the incision site wet. Bathe or shower as directed by your health care provider. How is my port accessed? Special steps must be taken to access the port:  Before the port is accessed, a numbing cream can be placed on the skin. This helps numb the skin over the port site.  Your health care provider uses a sterile technique to access the port. ? Your health care provider must put on a mask and sterile gloves. ? The skin over your port is cleaned carefully with an antiseptic and allowed to dry. ? The port is gently pinched between sterile gloves, and a needle is inserted into the port.  Only "non-coring" port needles should be used to access the port. Once the port is accessed, a blood return should be checked. This helps ensure that the port  is in the vein and is not clogged.  If your port needs to remain accessed for a constant infusion, a clear (transparent) bandage will be placed over the needle site. The bandage and needle will need to be changed every week, or as directed by your health care provider.  Keep the bandage covering the needle clean and dry. Do not get it wet. Follow your health care provider's instructions on how to take a shower or bath while the port is accessed.  If your port does not need to stay accessed, no bandage is needed over the port.  What is flushing? Flushing helps keep the port from getting clogged. Follow your health care provider's instructions on how and when to flush the port. Ports are usually flushed with saline solution or a medicine called heparin. The need for flushing will depend on how the port is used.  If the port is used for intermittent medicines or blood draws, the port will need to be flushed: ? After medicines have been given. ? After blood has been drawn. ? As part of routine maintenance.  If a constant infusion is running, the port may not need to be flushed.  How long will my port stay implanted? The port can stay in for as long as your health care provider thinks it is needed. When it is time for the port to come out, surgery will be   done to remove it. The procedure is similar to the one performed when the port was put in. When should I seek immediate medical care? When you have an implanted port, you should seek immediate medical care if:  You notice a bad smell coming from the incision site.  You have swelling, redness, or drainage at the incision site.  You have more swelling or pain at the port site or the surrounding area.  You have a fever that is not controlled with medicine.  This information is not intended to replace advice given to you by your health care provider. Make sure you discuss any questions you have with your health care provider. Document  Released: 11/13/2005 Document Revised: 04/20/2016 Document Reviewed: 07/21/2013 Elsevier Interactive Patient Education  2017 [employer]  

## 2018-08-21 ENCOUNTER — Telehealth: Payer: Self-pay

## 2018-08-21 ENCOUNTER — Other Ambulatory Visit: Payer: Medicare Other

## 2018-08-21 ENCOUNTER — Encounter: Payer: Self-pay | Admitting: Internal Medicine

## 2018-08-21 ENCOUNTER — Inpatient Hospital Stay (HOSPITAL_BASED_OUTPATIENT_CLINIC_OR_DEPARTMENT_OTHER): Payer: Medicare Other | Admitting: Internal Medicine

## 2018-08-21 VITALS — BP 138/80 | HR 82 | Temp 98.1°F | Resp 16 | Ht 65.0 in | Wt 261.3 lb

## 2018-08-21 DIAGNOSIS — D509 Iron deficiency anemia, unspecified: Secondary | ICD-10-CM

## 2018-08-21 DIAGNOSIS — Z85118 Personal history of other malignant neoplasm of bronchus and lung: Secondary | ICD-10-CM

## 2018-08-21 DIAGNOSIS — C3491 Malignant neoplasm of unspecified part of right bronchus or lung: Secondary | ICD-10-CM

## 2018-08-21 DIAGNOSIS — C3490 Malignant neoplasm of unspecified part of unspecified bronchus or lung: Secondary | ICD-10-CM

## 2018-08-21 DIAGNOSIS — J449 Chronic obstructive pulmonary disease, unspecified: Secondary | ICD-10-CM | POA: Diagnosis not present

## 2018-08-21 DIAGNOSIS — Z9221 Personal history of antineoplastic chemotherapy: Secondary | ICD-10-CM

## 2018-08-21 DIAGNOSIS — Z9981 Dependence on supplemental oxygen: Secondary | ICD-10-CM

## 2018-08-21 DIAGNOSIS — J9611 Chronic respiratory failure with hypoxia: Secondary | ICD-10-CM

## 2018-08-21 NOTE — Progress Notes (Signed)
[facility] [phone]   [phone]  OFFICE PROGRESS NOTE  [name], MD [place] [address]  DIAGNOSIS: Recurrent non-small cell lung cancer, adenocarcinoma with negative EGFR and ALK mutations initially diagnosed as a stage IIA in March 2008.  PRIOR THERAPY: 1. Status post concurrent chemoradiation with weekly carboplatin and paclitaxel. Last dose was given Apr 12, 2007. 2. Status post 3 cycles of consolidation chemotherapy with docetaxel. Last dose was given July 12, 2007. 3. 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 4. Status post 39 cycles of maintenance Alimta at a dose of 500 mg/m2. Last dose was given February 25, 2010, discontinued secondary to persistent anemia, but the patient has stable disease. 5. Tarceva 150 mg by mouth daily. Status post approximately 37 months of therapy.   6. Tarceva 100 mg by mouth daily, status post 13 months of treatment.  CURRENT THERAPY: Observation.  INTERVAL HISTORY: Diane [name] 77 y.o. female returns to the clinic today for follow-up visit.  The patient is feeling fine today except for the baseline shortness of breath and she is currently on home oxygen.  She is managed for her COPD by Dr. Lamonte Sakai.  Was also found to have positive stool and the patient underwent EGD and colonoscopy by gastroenterology.  She had several polyps removed during her colonoscopy.  The patient denied having any current chest pain, cough or hemoptysis.  She denied having recent weight loss or night sweats.  She has no nausea, vomiting, diarrhea or constipation.  She has no headache or visual changes.  She had repeat CT scan of the chest performed recently and she is here for evaluation and discussion of her risk her results.  MEDICAL HISTORY: Past Medical History:  Diagnosis Date  . Anemia   . Aortic atherosclerosis ([place]) 12/03/2017     Noted on CT scan  . CAD (coronary artery disease), native coronary artery 12/03/2017   Noted on CT scan by calcification   . CHF (congestive heart failure) ([place])   . Chronic diastolic heart failure ([place]) 12/03/2017  . Dyspnea   . GERD (gastroesophageal reflux disease)   . Heart murmur   . History of blood transfusion    "related to chemo" (12/05/2017)  . Hypertension   . Hypertensive heart disease 12/03/2017  . Kidney disease, chronic, stage III (GFR 30-59 ml/min) ([place]) 12/03/2017  . Lung cancer ([place]) dx'd 02/2010  . Mitral valve prolapse   . Morbid obesity ([place]) 12/03/2017  . On home oxygen therapy   . 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 Paroxysmal atrial fibrillation ([facility])   . Persistent atrial fibrillation ([place] [facility]) 12/03/2017  . Restrictive lung disease 05/23/2017    ALLERGIES:  is allergic to septra ds [sulfamethoxazole-trimethoprim].  MEDICATIONS:  Current Outpatient Medications  Medication Sig Dispense Refill  . acetaminophen (TYLENOL) 500 MG tablet Take 1,000 mg by mouth at bedtime.    Marland Kitchen albuterol (PROAIR HFA) 108 (90 Base) MCG/ACT inhaler Inhale 2 puffs into the lungs every 4 (four) hours as needed for wheezing or shortness of breath. 1 Inhaler 5  . amiodarone (PACERONE) 200 MG tablet Take 1 tablet (200 mg total) by mouth daily. 90 tablet 1  . apixaban (ELIQUIS) 5 MG TABS tablet Take 1 tablet (5 mg total) by mouth 2 (two) times daily. OK to restart on Friday 05/10/18.    Marland Kitchen calcitRIOL (ROCALTROL) 0.25 MCG capsule Take 0.25  mcg by mouth daily.     . carvedilol (COREG) 25 MG tablet Take 12.5 mg by mouth 2 (two) times daily with a meal.     . cyanocobalamin (,VITAMIN B-12,) 1000 MCG/ML injection INJECT 1 ML(CC) ONCE A WEEK FOR 4 WEEKS THEN ONCE A MONTH  0  . hydrALAZINE (APRESOLINE) 25 MG tablet Take 25 mg by mouth 2 (two) times daily.    . hydroxypropyl methylcellulose / hypromellose (ISOPTO TEARS / GONIOVISC) 2.5 % ophthalmic solution Place 1 drop into both eyes 2 (two) times  daily as needed for dry eyes.     . Melatonin 10 MG TABS Take 10 mg by mouth daily.    . polyethylene glycol (MIRALAX / GLYCOLAX) packet Take 17 g by mouth at bedtime.     . torsemide (DEMADEX) 20 MG tablet Take 2 tablets (40 mg total) by mouth 2 (two) times daily with a meal. 360 tablet 1  . umeclidinium-vilanterol (ANORO ELLIPTA) 62.5-25 MCG/INH AEPB Inhale 1 puff into the lungs daily. 1 each 5  . vitamin B-12 (CYANOCOBALAMIN) 250 MCG tablet Take 250 mcg by mouth daily.     No current facility-administered medications for this visit.    Facility-Administered Medications Ordered in Other Visits  Medication Dose Route Frequency Provider Last Rate Last Dose  . sodium chloride 0.9 % injection 10 mL  10 mL Intravenous PRN Curt Bears, MD   10 mL at 10/14/13 1030    SURGICAL HISTORY:  Past Surgical History:  Procedure Laterality Date  . BIOPSY  07/16/2018   Procedure: BIOPSY;  Surgeon: Mauri Pole, MD;  Location: [facility];  Service: Endoscopy;;  . CARDIOVERSION N/A 12/06/2017   Procedure: CARDIOVERSION;  Surgeon: Jacolyn Reedy, MD;  Location: [facility] ENDOSCOPY;  Service: Cardiovascular;  Laterality: N/A;  . CATARACT EXTRACTION W/ INTRAOCULAR LENS  IMPLANT, BILATERAL Bilateral   . COLONOSCOPY WITH PROPOFOL N/A 07/16/2018   Procedure: COLONOSCOPY WITH PROPOFOL;  Surgeon: Mauri Pole, MD;  Location: [facility] ENDOSCOPY;  Service: Endoscopy;  Laterality: N/A;  . DERMABRASION  ~ 1966   "face; for acne"  . ESOPHAGOGASTRODUODENOSCOPY (EGD) WITH PROPOFOL N/A 07/16/2018   Procedure: ESOPHAGOGASTRODUODENOSCOPY (EGD) WITH PROPOFOL;  Surgeon: Mauri Pole, MD;  Location: [facility] ENDOSCOPY;  Service: Endoscopy;  Laterality: N/A;  . PAROTID GLAND TUMOR EXCISION Left   . POLYPECTOMY  07/16/2018   Procedure: POLYPECTOMY;  Surgeon: Mauri Pole, MD;  Location: [facility] ENDOSCOPY;  Service: Endoscopy;;  . PORTACATH PLACEMENT  11/03/08-[place]   tip in mid SVC-E. [place]  . THORACENTESIS  Right   . TONSILLECTOMY    . VIDEO BRONCHOSCOPY WITH ENDOBRONCHIAL ULTRASOUND Right 05/08/2018   Procedure: VIDEO BRONCHOSCOPY WITH ENDOBRONCHIAL ULTRASOUND;  Surgeon: Collene Gobble, MD;  Location: [facility] OR;  Service: Thoracic;  Laterality: Right;    REVIEW OF SYSTEMS:  A comprehensive review of systems was negative except for: Constitutional: positive for fatigue Respiratory: positive for cough and dyspnea on exertion   PHYSICAL EXAMINATION: General appearance: alert, cooperative, fatigued and no distress Head: Normocephalic, without obvious abnormality, atraumatic Neck: no adenopathy, no JVD, supple, symmetrical, trachea midline and thyroid not enlarged, symmetric, no tenderness/mass/nodules Lymph nodes: Cervical, supraclavicular, and axillary nodes normal. Resp: wheezes bilaterally Back: symmetric, no curvature. ROM normal. No CVA tenderness. Cardio: regular rate and rhythm, S1, S2 normal, no murmur, click, rub or gallop GI: soft, non-tender; bowel sounds normal; no masses,  no organomegaly Extremities: extremities normal, atraumatic, no cyanosis or edema  ECOG PERFORMANCE STATUS: 1 - Symptomatic but completely  ambulatory  Blood pressure 138/80, pulse 82, temperature 98.1 F (36.7 C), temperature source Oral, resp. rate 16, height '5\' 5"'$  (1.651 m), weight 261 lb 4.8 oz (118.5 kg), SpO2 95 %.  LABORATORY DATA: Lab Results  Component Value Date   WBC 5.2 08/19/2018   HGB 10.9 (L) 08/19/2018   HCT 34.7 (L) 08/19/2018   MCV 98.3 08/19/2018   PLT 137 (L) 08/19/2018      Chemistry      Component Value Date/Time   NA 140 08/19/2018 0802   NA 140 10/01/2017 1116   K 4.3 08/19/2018 0802   K 4.2 10/01/2017 1116   CL 100 08/19/2018 0802   CL 102 05/08/2013 0851   CO2 29 08/19/2018 0802   CO2 29 10/01/2017 1116   BUN 37 (H) 08/19/2018 0802   BUN 31.7 (H) 10/01/2017 1116   CREATININE 2.06 (H) 08/19/2018 0802   CREATININE 1.8 (H) 10/01/2017 1116      Component Value Date/Time    CALCIUM 9.3 08/19/2018 0802   CALCIUM 9.5 10/01/2017 1116   ALKPHOS 60 08/19/2018 0802   ALKPHOS 71 10/01/2017 1116   AST 13 (L) 08/19/2018 0802   AST 14 10/01/2017 1116   ALT 9 08/19/2018 0802   ALT 8 10/01/2017 1116   BILITOT 0.6 08/19/2018 0802   BILITOT 0.71 10/01/2017 1116       RADIOGRAPHIC STUDIES: Ct Chest Wo Contrast  Result Date: 08/19/2018 CLINICAL DATA:  77 year old female with history of lung cancer. Follow-up study. EXAM: CT CHEST WITHOUT CONTRAST TECHNIQUE: Multidetector CT imaging of the chest was performed following the standard protocol without IV contrast. COMPARISON:  Chest CT 04/15/2018. FINDINGS: Cardiovascular: Heart size is normal. There is no significant pericardial fluid, thickening or pericardial calcification. There is aortic atherosclerosis, as well as atherosclerosis of the great vessels of the mediastinum and the coronary arteries, including calcified atherosclerotic plaque in the left main, left anterior descending, left circumflex and right coronary arteries. Calcifications (mild) of the aortic valve and mitral annulus. Left internal jugular single-lumen porta cath with tip terminating in the mid superior vena cava. Mediastinum/Nodes: No pathologically enlarged mediastinal or hilar lymph nodes. Please note that accurate exclusion of hilar adenopathy is limited on noncontrast CT scans. Esophagus is unremarkable in appearance. No axillary lymphadenopathy. 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 There are several nodular areas of architectural distortion scattered throughout other portions of the right lung, some of which are stable compared to prior studies, but others are more conspicuous. The 2 most notable lesions include a  right upper lobe lesion near the apex (axial image 36 of series 5) measuring 1.8 x 0.9 cm, and a mixed ground-glass attenuation and solid lesion in the anterior aspect of the right upper lobe (axial image 59 of series 5) measuring 1.8 x 1.8 cm with a central solid component measuring approximately 10 mm. Chronic right pleural thickening, subpulmonic fluid collection, and extensive pleural calcifications (predominantly in the sub pulmonic region) again noted. No left pleural effusion. Diffuse bronchial wall thickening with mild centrilobular and paraseptal emphysema. Upper Abdomen: Aortic atherosclerosis. Musculoskeletal: There are no aggressive appearing lytic or blastic lesions noted in the visualized portions of the skeleton. IMPRESSION: 1. 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 There are several smaller nodular areas in the right lung which appear relatively stable compared to the most recent prior examination, but have become more conspicuous over serial prior examinations. The possibility of slow-growing neoplasms such as adenocarcinoma should be contemplated, and further evaluation with PET-CT could be considered if clinically appropriate. 2. Aortic atherosclerosis, in addition to left main and 3 vessel coronary artery disease. Assessment for potential risk factor modification, dietary therapy or pharmacologic therapy may be warranted, if clinically indicated. 3. There are calcifications of the aortic valve and mitral annulus. Echocardiographic correlation for evaluation of potential valvular dysfunction may be warranted if clinically indicated. 4. Mild diffuse bronchial wall thickening with mild centrilobular and paraseptal emphysema. Aortic Atherosclerosis (ICD10-I70.0) and Emphysema (ICD10-J43.9). Electronically Signed   By: Vinnie Langton M.D.   On:  08/19/2018 09:36    ASSESSMENT AND PLAN:  This is a very pleasant 77 years old white female with recurrent non-small cell lung cancer, adenocarcinoma diagnosed in March 2008 status post several chemotherapy regimens and was on treatment with Tarceva for more than 4 years. The patient is feeling fine today and no complaint except for the baseline shortness of breath she is currently on home oxygen secondary to COPD. She had repeat CT scan of the chest performed recently.  I personally and independently reviewed the scans and discussed the results with the patient.  Her scan showed no concerning findings for disease progression. I recommended for the patient to continue on observation with repeat CT scan of the chest in 4 months. For the iron deficiency anemia, she will continue with the oral iron tablets. The patient was advised to call immediately if she has any concerning symptoms in the interval. The patient voices understanding of current disease status and treatment options and is in agreement with the current care plan. All questions were answered. The patient knows to call the clinic with any problems, questions or concerns. We can certainly see the patient much sooner if necessary.  Disclaimer: This note was dictated with voice recognition software. Similar sounding words can inadvertently be transcribed and may not be corrected upon review.

## 2018-08-21 NOTE — Telephone Encounter (Signed)
Printed avs and calender of upcoming appointment. Per 9/25 los

## 2018-08-22 ENCOUNTER — Telehealth: Payer: Self-pay | Admitting: Internal Medicine

## 2018-08-22 NOTE — Telephone Encounter (Signed)
Patient called to say she left her shedulting info behind in error.  Mailed out calendars to patient.

## 2018-08-28 ENCOUNTER — Encounter: Payer: Self-pay | Admitting: Emergency Medicine

## 2018-08-28 ENCOUNTER — Ambulatory Visit: Payer: Medicare Other | Admitting: Emergency Medicine

## 2018-08-28 DIAGNOSIS — J9611 Chronic respiratory failure with hypoxia: Secondary | ICD-10-CM

## 2018-08-28 DIAGNOSIS — C3491 Malignant neoplasm of unspecified part of right bronchus or lung: Secondary | ICD-10-CM | POA: Diagnosis not present

## 2018-08-28 DIAGNOSIS — J449 Chronic obstructive pulmonary disease, unspecified: Secondary | ICD-10-CM

## 2018-08-28 NOTE — Assessment & Plan Note (Signed)
Tolerating Anoro with albuterol about twice a day.  Will continue same regimen.  She is had her flu shot this year and tells me that she had a pneumonia shots in the past; we will confirm this with her PCP.

## 2018-08-28 NOTE — Patient Instructions (Signed)
Your CT scan of the chest does not show any significant change compared with your prior.  Agree with continuing to follow with serial CT scans with Dr. [name].  His next is planned for 4 months. Continue Anoro once daily as you are taking it. Keep albuterol available to use 2 puffs up to every 4 hours as needed for shortness of breath, chest tightness, wheezing. Continue your oxygen at 2 L/min at all times.   Your flu shot is up-to-date.  We will confirm the timing of your pneumonia shots with Dr. Arelia Sneddon office Follow with Dr Lamonte Sakai in 4 months or sooner if you have any problems.

## 2018-08-28 NOTE — Assessment & Plan Note (Signed)
I still have some suspicion about the parenchymal and right perihilar lesions on her CT scan of the chest.  That being said her biopsies done in June were negative for malignancy and her most recent repeat CT showed interval stability.  She will continue to follow with Dr. [name] and have a repeat scan in 4 months per his plans.  If it changes then we may need to talk about another diagnostic procedure.

## 2018-08-28 NOTE — Progress Notes (Signed)
Subjective:    Patient ID: Diane [name], female    DOB: Jan 14, 1941, 77 y.o.   MRN: [id number]  HPI Diane [name] is a 77 year old woman, former smoker, with a history of COPD, hypertension and fairly new dx atrial fibrillation with diastolic CHF. Underwent DCCV.  She is on amiodarone and anticoagulation.  Most recent pulmonary function testing was 05/23/17 which I have reviewed.  This shows evidence for mixed obstruction and restriction, severely decreased FEV1 without a bronchodilator response.  Her total lung capacity is reduced, decreased diffusion capacity that corrects to the normal range when adjusted for alveolar volume. She was started on Anoro at her last visit here. She has albuterol but does not use. She cannot tell how much the Anoro has helped some w her wheezing.   She is on oxygen at 2L/min with exertion. She remains active.   History of non-small cell lung cancer originally diagnosed 2008, with a recurrence.  He has been treated with chemotherapy and long-term Tarceva, 4 yrs and able to come off of this 1 yr ago.  She is currently on observation.  She has been under surveillance, most recent CT scan of the chest 01/21/18 which I reviewed.  This showed chronic right sided changes post radiation treatment, a stable small groundglass nodule the left upper lobe (3 mm), a new left upper lobe 4 mm nodule, unchanged 5 mm left lower lobe nodule.  There was also a small new left pleural effusion.  Thoracentesis was attempted 01/31/18 but the effusion was too small to sample.   ROV 07/08/18 --follow-up visit for patient with a history of COPD, atrial fibrillation with amiodarone use, non-small cell lung cancer originally diagnosed 2008, on observation and followed by Dr. [name].  She underwent bronchoscopy 6/12//2019 when it was found that CT scan of the chest showed right perihilar soft tissue with some associated right lower lobe obstruction.  She underwent nodal biopsies, transbronchial,  endobronchial biopsies that were negative for malignancy. She is on Anoro, co-pay is over $100. She is having some exertional SOB w walking. She is not using albuterol - doesn't have it anymore. She is wearing O2 at 2L/min at all times. She is due for a repeat CT chest in September to look for interval change.   ROV 08/28/18 --patient is 77 and has a history of COPD, atrial fibrillation with amiodarone use, non-small cell lung cancer originally diagnosed 2008.  She underwent bronchoscopy 05/08/2018 for right perihilar soft tissue lesion.  Her biopsies were negative.  A repeat CT scan of her chest was done 08/19/2018 which I reviewed.  This shows a similar appearance with right medial lower lobe opacity as well as some scattered pulmonary nodules that are for the most part unchanged.  There is also a mixed groundglass solid lesion in the anterior right upper lobe, chronic pleural thickening (which was negative on bx). She is now dealing with progressive renal failure, is following with Dr Justin Mend, considering dialysis. She remains on Anoro, feels that it is helping her. She is using albuterol about 2x a day. She is on O2 at 2L/min. She had her flu shot at [employer] recently. She has had PNA shots, not sure when, at Dr Derek Mound  Next CT in 4 months.     Review of Systems   Past Medical History:  Diagnosis Date  . Anemia   . Aortic atherosclerosis ([place]) 12/03/2017   Noted on CT scan  . CAD (coronary artery disease), native coronary artery 12/03/2017  Noted on CT scan by calcification   . CHF (congestive heart failure) ([place])   . Chronic diastolic heart failure ([place]) 12/03/2017  . Dyspnea   . GERD (gastroesophageal reflux disease)   . Heart murmur   . History of blood transfusion    "related to chemo" (12/05/2017)  . Hypertension   . Hypertensive heart disease 12/03/2017  . Kidney disease, chronic, stage III (GFR 30-59 ml/min) ([place]) 12/03/2017  . Lung cancer ([place]) dx'd 02/2010  . Mitral valve prolapse   .  Morbid obesity ([place]) 12/03/2017  . On home oxygen therapy   . 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 Paroxysmal atrial fibrillation ([facility])   . Persistent atrial fibrillation 12/03/2017  . Restrictive lung disease 05/23/2017     Family History  Problem Relation Age of Onset  . Breast cancer Mother   . Heart attack Mother   . Diabetes Father   . Heart attack Father   . Diabetes Brother   . COPD Cousin   . Colon cancer Neg Hx      Social History   Socioeconomic History  . Marital status: Married    Spouse name: Not on file  . Number of children: Not on file  . Years of education: Not on file  . Highest education level: Not on file  Occupational History  . Not on file  Social Needs  . Financial resource strain: Not hard at all  . Food insecurity:    Worry: Patient refused    Inability: Patient refused  . Transportation needs:    Medical: Patient refused    Non-medical: Patient refused  Tobacco Use  . Smoking status: Former Smoker    Packs/day: 2.00    Years: 40.00    Pack years: 80.00    Types: Cigarettes    Last attempt to quit: 11/27/2001    Years since quitting: 16.7  . Smokeless tobacco: Never Used  Substance and Sexual Activity  . Alcohol use: Yes    Comment: 12/05/2017 "once in a great while I might have a drink"  . Drug use: No  . Sexual activity: Not Currently  Lifestyle  . Physical activity:    Days per week: 1 day    Minutes per session: 30 min  . Stress: Only a little  Relationships  . Social connections:    Talks on phone: Patient refused    Gets together: Patient refused    Attends religious service: Patient refused    Active member of club or organization: Patient refused    Attends meetings of clubs or organizations: Patient refused    Relationship status: Patient refused  . Intimate partner violence:    Fear of current or ex partner: Patient refused    Emotionally abused: Patient refused    Physically abused: Patient refused    Forced  sexual activity: Patient refused  Other Topics Concern  . Not on file  Social History Narrative   [facility] (02/19/17):   Patient is originally from [facility]. She has also lived in [place]. She has worked primarily Veterinary surgeon. Has a dog currently. No bird or mold exposure.      Allergies  Allergen Reactions  . Septra Ds [Sulfamethoxazole-Trimethoprim] Nausea Only     Outpatient Medications Prior to Visit  Medication Sig Dispense Refill  . acetaminophen (TYLENOL) 500 MG tablet Take 1,000 mg by mouth at bedtime.    Marland Kitchen albuterol (PROAIR HFA) 108 (90 Base) MCG/ACT inhaler Inhale 2 puffs into the lungs every 4 (  four) hours as needed for wheezing or shortness of breath. 1 Inhaler 5  . amiodarone (PACERONE) 200 MG tablet Take 1 tablet (200 mg total) by mouth daily. 90 tablet 1  . apixaban (ELIQUIS) 5 MG TABS tablet Take 1 tablet (5 mg total) by mouth 2 (two) times daily. OK to restart on Friday 05/10/18.    Marland Kitchen calcitRIOL (ROCALTROL) 0.25 MCG capsule Take 0.25 mcg by mouth daily.     . carvedilol (COREG) 25 MG tablet Take 12.5 mg by mouth 2 (two) times daily with a meal.     . hydrALAZINE (APRESOLINE) 25 MG tablet Take 25 mg by mouth 2 (two) times daily.    . hydroxypropyl methylcellulose / hypromellose (ISOPTO TEARS / GONIOVISC) 2.5 % ophthalmic solution Place 1 drop into both eyes 2 (two) times daily as needed for dry eyes.     . Melatonin 10 MG TABS Take 10 mg by mouth daily.    . polyethylene glycol (MIRALAX / GLYCOLAX) packet Take 17 g by mouth at bedtime.     . torsemide (DEMADEX) 20 MG tablet Take 2 tablets (40 mg total) by mouth 2 (two) times daily with a meal. 360 tablet 1  . umeclidinium-vilanterol (ANORO ELLIPTA) 62.5-25 MCG/INH AEPB Inhale 1 puff into the lungs daily. 1 each 5  . vitamin B-12 (CYANOCOBALAMIN) 250 MCG tablet Take 250 mcg by mouth daily.    . cyanocobalamin (,VITAMIN B-12,) 1000 MCG/ML injection INJECT 1 ML(CC) ONCE A WEEK FOR 4 WEEKS THEN ONCE A MONTH  0    Facility-Administered Medications Prior to Visit  Medication Dose Route Frequency Provider Last Rate Last Dose  . sodium chloride 0.9 % injection 10 mL  10 mL Intravenous PRN Curt Bears, MD   10 mL at 10/14/13 1030        Objective:   Physical Exam Vitals:   08/28/18 1343  BP: 134/76  Pulse: 82  SpO2: 92%  Weight: 260 lb (117.9 kg)  Height: 5\' 5"  (1.651 m)   Gen: Pleasant, well-nourished, in no distress,  normal affect  ENT: No lesions,  mouth clear,  oropharynx clear, no postnasal drip  Neck: No JVD, no stridor  Lungs: No use of accessory muscles, R sided insp and exp rhonchi. L clear  Cardiovascular: RRR, heart sounds normal, no murmur or gallops, trace peripheral edema  Musculoskeletal: No deformities, no cyanosis or clubbing  Neuro: alert, non focal  Skin: Warm, no lesions or rash     Assessment & Plan:  Cancer of right lung parenchyma (HCC) I still have some suspicion about the parenchymal and right perihilar lesions on her CT scan of the chest.  That being said her biopsies done in June were negative for malignancy and her most recent repeat CT showed interval stability.  She will continue to follow with Dr. [name] and have a repeat scan in 4 months per his plans.  If it changes then we may need to talk about another diagnostic procedure.  COPD (chronic obstructive pulmonary disease) (HCC) Tolerating Anoro with albuterol about twice a day.  Will continue same regimen.  She is had her flu shot this year and tells me that she had a pneumonia shots in the past; we will confirm this with her PCP.  Chronic respiratory failure with hypoxia (HCC) Adequately saturated with her oxygen at 2 L/min.  We will continue same regimen.  Baltazar Apo, MD, PhD 08/28/2018, 2:03 PM [facility] [phone] or if no answer [phone]

## 2018-08-28 NOTE — Assessment & Plan Note (Signed)
Adequately saturated with her oxygen at 2 L/min.  We will continue same regimen.

## 2018-09-18 ENCOUNTER — Other Ambulatory Visit: Payer: Self-pay | Admitting: Adult Health

## 2018-10-02 ENCOUNTER — Encounter: Payer: Self-pay | Admitting: Cardiology

## 2018-10-02 ENCOUNTER — Ambulatory Visit: Payer: Medicare Other | Admitting: Cardiology

## 2018-10-02 VITALS — BP 153/79 | HR 79 | Ht 65.0 in | Wt 265.4 lb

## 2018-10-02 DIAGNOSIS — I2510 Atherosclerotic heart disease of native coronary artery without angina pectoris: Secondary | ICD-10-CM

## 2018-10-02 DIAGNOSIS — I4819 Other persistent atrial fibrillation: Secondary | ICD-10-CM

## 2018-10-02 DIAGNOSIS — Z7901 Long term (current) use of anticoagulants: Secondary | ICD-10-CM

## 2018-10-02 DIAGNOSIS — I130 Hypertensive heart and chronic kidney disease with heart failure and stage 1 through stage 4 chronic kidney disease, or unspecified chronic kidney disease: Secondary | ICD-10-CM

## 2018-10-02 MED ORDER — HYDRALAZINE HCL 25 MG PO TABS: 25.0000 mg | ORAL_TABLET | Freq: Two times a day (BID) | ORAL | 3 refills | Status: DC

## 2018-10-02 MED ORDER — AMIODARONE HCL 200 MG PO TABS: 200.0000 mg | ORAL_TABLET | Freq: Every day | ORAL | 3 refills | Status: AC

## 2018-10-02 MED ORDER — APIXABAN 5 MG PO TABS: 5.0000 mg | ORAL_TABLET | Freq: Two times a day (BID) | ORAL | 3 refills | Status: AC

## 2018-10-02 MED ORDER — CARVEDILOL 25 MG PO TABS: 25.0000 mg | ORAL_TABLET | Freq: Two times a day (BID) | ORAL | 3 refills | Status: DC

## 2018-10-02 NOTE — Progress Notes (Signed)
PCP: [name], MD  [facility] Pel - Nephrologist -class IV CKD [place] - Pulmonologist.  COPD and lung cancer  Clinic Note: Chief Complaint  Patient presents with  . Follow-up    Stable  . Atrial Fibrillation    HPI: Diane [name] is a 77 y.o. female with PAF & Chronic HFpEF who is being seen today for 4 month follow-up after converting to [facility] heart care.    Diane [name] [facility] was seeing Dr. Wynonia Lawman for PAfib - with Chronic DHF (HFpEF); they also listed coronary disease based on coronary calcification on CT scan, but I do not know of any additional evaluation has been done (she tells me that she had a stress test done by Dr. Wynonia Lawman). --> wanted to change to [facility] b/c Epic notes. -CHA2DS2-VASC Score:  4 --> anticoagulated with Eliquis.  She was cardioverted in Jan 2019 - now on Amiodarone..--Was converted to torsemide for diuretic.  In addition to A. fib and diastolic heart failure she has a history of right-sided lung cancer and COPD  I saw for for the first visit in July 2019 -had not noted any recurrence of her A. fib following cardioversion.  No bleeding on anticoagulation.  Recent Hospitalizations:   Colonoscopy / Endoscopy 07/16/18 for Heme + stool   LA Grade A reflux esophagitis; 2 sessile polyps in ascending colon/cecum (4-5 mm) - snare polypectomy w/ ooze.  2 4-5 mm sessile polyps in T-Colon - hot snare polypectomy resection.  4 1-2 mmsessile polyps in rectum/descending & ascending colon -> cold biopsy forceps resection. Non-bleeding internal hemorrhoids.  Studies Personally Reviewed - (if available, images/films reviewed: From Epic Chart or Care Everywhere)  No new tests  Interval History: Diane [name] presents here for cardiology follow-up.  She actually seems to doing quite well from a cardiac standpoint.  She has not had any recurrent episodes of A. fib that she is aware of.  No PND, orthopnea with mild end of day edema.  Edema is well controlled with current  dose of torsemide.  She has baseline dyspnea on oxygen especially nighttime oxygen. She denies any sensation of rapid irregular heartbeats palpitations.  She denies any chest pain or pressure with rest or exertion.  No syncope/near syncope or TIA/amaurosis fugax. Since her GI procedures, she has not had any further blood in her stools.  Hemoglobin levels are stabilizing now.  Is back on Eliquis now with no further bleeding.  No melena, hematochezia, hematuria or epistaxis. sure with rest or exertion.  No syncope/near syncope or TIA/amaurosis fugax.  No claudication.  She still uses oxygen for COPD.  She has a indwelling port in left chest with a history of lung cancer.  ROS: A comprehensive was performed. Review of Systems  Constitutional: Positive for malaise/fatigue (Overall better energy level.). Negative for chills, fever and weight loss.  HENT: Negative for congestion and nosebleeds.   Respiratory: Positive for cough, shortness of breath and wheezing (Has some good days and some bad.). Negative for sputum production.        Chronic COPD symptoms.  Gastrointestinal: Negative for abdominal pain, blood in stool (My understanding was that this sounded like he was guaiac positive.  She does not describe hematochezia.), constipation and melena.  Genitourinary: Negative for dysuria and hematuria.  Musculoskeletal: Positive for joint pain.  Neurological: Positive for weakness (Generalized). Negative for dizziness and headaches.  Psychiatric/Behavioral: Negative for depression and memory loss. The patient is nervous/anxious. Insomnia: Refuses sleep study.  Wears oxygen at  night.   All other systems reviewed and are negative.  I have reviewed and (if needed) personally updated the patient's problem list, medications, allergies, past medical and surgical history, social and family history.   Past Medical History:  Diagnosis Date  . Anemia   . Aortic atherosclerosis ([place]) 12/03/2017   Noted on  CT scan  . CAD (coronary artery disease), native coronary artery 12/03/2017   Noted on CT scan by calcification   . CHF (congestive heart failure) ([place])   . Chronic diastolic heart failure ([place]) 12/03/2017  . Dyspnea   . GERD (gastroesophageal reflux disease)   . Heart murmur   . History of blood transfusion    "related to chemo" (12/05/2017)  . Hypertension   . Hypertensive heart disease 12/03/2017  . Kidney disease, chronic, stage III (GFR 30-59 ml/min) ([place]) 12/03/2017  . Lung cancer ([place]) dx'd 02/2010  . Mitral valve prolapse   . Morbid obesity ([place]) 12/03/2017  . On home oxygen therapy   . 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 Paroxysmal atrial fibrillation (HCC)   . Persistent atrial fibrillation 12/03/2017  . Restrictive lung disease 05/23/2017    Past Surgical History:  Procedure Laterality Date  . BIOPSY  07/16/2018   Procedure: BIOPSY;  Surgeon: Mauri Pole, MD;  Location: [facility];  Service: Endoscopy;;  . CARDIOVERSION N/A 12/06/2017   Procedure: CARDIOVERSION;  Surgeon: Jacolyn Reedy, MD;  Location: [facility] ENDOSCOPY;  Service: Cardiovascular;  Laterality: N/A;  . CATARACT EXTRACTION W/ INTRAOCULAR LENS  IMPLANT, BILATERAL Bilateral   . COLONOSCOPY WITH PROPOFOL N/A 07/16/2018   Procedure: COLONOSCOPY WITH PROPOFOL;  Surgeon: Mauri Pole, MD;  Location: [facility] ENDOSCOPY;  Service: Endoscopy;  Laterality: N/A;  . DERMABRASION  ~ 1966   "face; for acne"  . ESOPHAGOGASTRODUODENOSCOPY (EGD) WITH PROPOFOL N/A 07/16/2018   Procedure: ESOPHAGOGASTRODUODENOSCOPY (EGD) WITH PROPOFOL;  Surgeon: Mauri Pole, MD;  Location: [facility] ENDOSCOPY;  Service: Endoscopy;  Laterality: N/A;  . PAROTID GLAND TUMOR EXCISION Left   . POLYPECTOMY  07/16/2018   Procedure: POLYPECTOMY;  Surgeon: Mauri Pole, MD;  Location: [facility] ENDOSCOPY;  Service: Endoscopy;;  . PORTACATH PLACEMENT  11/03/08-[place]   tip in mid SVC-E. [place]  . THORACENTESIS Right   . TONSILLECTOMY    .  VIDEO BRONCHOSCOPY WITH ENDOBRONCHIAL ULTRASOUND Right 05/08/2018   Procedure: VIDEO BRONCHOSCOPY WITH ENDOBRONCHIAL ULTRASOUND;  Surgeon: Collene Gobble, MD;  Location: [facility];  Service: Thoracic;  Laterality: Right;    2D Echo January 2019: EF 65 and 70%.  No regional wall motion normality.  Mild LVH.  Moderate LA dilation.  Normal PA pressures.  From Dr. Wynonia Lawman: Patient had a stress test and echocardiogram as well as a different test.  Current Meds  Medication Sig  . acetaminophen (TYLENOL) 500 MG tablet Take 1,000 mg by mouth at bedtime.  Marland Kitchen albuterol (PROAIR HFA) 108 (90 Base) MCG/ACT inhaler Inhale 2 puffs into the lungs every 4 (four) hours as needed for wheezing or shortness of breath.  Marland Kitchen amiodarone (PACERONE) 200 MG tablet Take 1 tablet (200 mg total) by mouth daily.  Jearl Klinefelter ELLIPTA 62.5-25 MCG/INH AEPB INHALE 1 PUFF BY MOUTH INTO LUNGS ONCE DAILY  . apixaban (ELIQUIS) 5 MG TABS tablet Take 1 tablet (5 mg total) by mouth 2 (two) times daily. OK to restart on Friday 05/10/18.  Marland Kitchen calcitRIOL (ROCALTROL) 0.5 MCG capsule TAKE 1 CAPSULE BY MOUTH ONCE DAILY INCREASE DOSE PER DR.WEBB AS OF 10 2 2019  .  carvedilol (COREG) 25 MG tablet Take 1 tablet (25 mg total) by mouth 2 (two) times daily with a meal.  . cyanocobalamin (,VITAMIN B-12,) 1000 MCG/ML injection INJECT 1 ML(CC) ONCE A WEEK FOR 4 WEEKS THEN ONCE A MONTH  . hydrALAZINE (APRESOLINE) 25 MG tablet Take 1 tablet (25 mg total) by mouth 2 (two) times daily.  . hydroxypropyl methylcellulose / hypromellose (ISOPTO TEARS / GONIOVISC) 2.5 % ophthalmic solution Place 1 drop into both eyes 2 (two) times daily as needed for dry eyes.   . Melatonin 10 MG TABS Take 10 mg by mouth daily.  . polyethylene glycol (MIRALAX / GLYCOLAX) packet Take 17 g by mouth at bedtime.   . torsemide (DEMADEX) 20 MG tablet Take 2 tablets (40 mg total) by mouth 2 (two) times daily with a meal.  . vitamin B-12 (CYANOCOBALAMIN) 250 MCG tablet Take 250 mcg by mouth daily.   . [DISCONTINUED] amiodarone (PACERONE) 200 MG tablet Take 1 tablet (200 mg total) by mouth daily.  . [DISCONTINUED] apixaban (ELIQUIS) 5 MG TABS tablet Take 1 tablet (5 mg total) by mouth 2 (two) times daily. OK to restart on Friday 05/10/18.  . [DISCONTINUED] carvedilol (COREG) 25 MG tablet Take 25 mg by mouth 2 (two) times daily with a meal.   . [DISCONTINUED] hydrALAZINE (APRESOLINE) 25 MG tablet Take 25 mg by mouth 2 (two) times daily.    Allergies  Allergen Reactions  . Septra Ds [Sulfamethoxazole-Trimethoprim] Nausea Only    Social History   Tobacco Use  . Smoking status: Former Smoker    Packs/day: 2.00    Years: 40.00    Pack years: 80.00    Types: Cigarettes    Last attempt to quit: 11/27/2001    Years since quitting: 16.8  . Smokeless tobacco: Never Used  Substance Use Topics  . Alcohol use: Yes    Comment: 12/05/2017 "once in a great while I might have a drink"  . Drug use: No   Social History   Social History Narrative   [facility] (02/19/17):   Patient is originally from [place] [facility]. She has also lived in [place]. She has worked primarily Veterinary surgeon. Has a dog currently. No bird or mold exposure.     family history includes Breast cancer in her mother; COPD in her cousin; Diabetes in her brother and father; Heart attack in her father and mother.  Wt Readings from Last 3 Encounters:  10/02/18 265 lb 6.4 oz (120.4 kg)  08/28/18 260 lb (117.9 kg)  08/21/18 261 lb 4.8 oz (118.5 kg)    PHYSICAL EXAM BP (!) 153/79   Pulse 79   Ht 5\' 5"  (1.651 m)   Wt 265 lb 6.4 oz (120.4 kg)   BMI 44.16 kg/m  Physical Exam  Constitutional: She is oriented to person, place, and time. No distress.  Chronically ill but nontoxic-appearing elderly woman.  She is in no acute distress.  She is using nasal cannula oxygen.  She seems very well read on her health conditions.  HENT:  Head: Normocephalic and atraumatic.  Mouth/Throat: No oropharyngeal exudate.  Eyes: Pupils are equal, round,  and reactive to light. EOM are normal. No scleral icterus.  Neck: Normal range of motion. Neck supple. No hepatojugular reflux and no JVD (Mildly elevated may be 8-9 cmH2O) present. Carotid bruit is not present.  Cardiovascular: Normal rate, regular rhythm, normal heart sounds, intact distal pulses and normal pulses.  No extrasystoles are present. PMI is not displaced. Exam reveals no gallop and no  friction rub.  No murmur heard. Pulmonary/Chest: She has wheezes. She has no rales. She exhibits no tenderness.  Accessory muscle use for breathing at baseline, but does appear to be nonlabored. Diffuse coarse ventilatory and expiratory breath sounds with expiratory wheezing.  Rhonchi but no rales or crackles.  Right side does appear to be worse than left.  Abdominal: Soft. Bowel sounds are normal. She exhibits no distension. There is no tenderness. There is no rebound.  Musculoskeletal: Normal range of motion. She exhibits no edema (Trivial.).  Lymphadenopathy:    She has no cervical adenopathy.  Neurological: She is alert and oriented to person, place, and time. No cranial nerve deficit.  Skin: Skin is warm and dry. No pallor.  Psychiatric: She has a normal mood and affect. Her behavior is normal. Judgment and thought content normal.     Adult ECG Report Not checked today  Other studies Reviewed: Additional studies/ records that were reviewed today include:  Recent Labs:  Lab Results  Component Value Date   CREATININE 2.06 (H) 08/19/2018   BUN 37 (H) 08/19/2018   NA 140 08/19/2018   K 4.3 08/19/2018   CL 100 08/19/2018   CO2 29 08/19/2018    Lab Results  Component Value Date   ALT 9 08/19/2018   AST 13 (L) 08/19/2018   ALKPHOS 60 08/19/2018   BILITOT 0.6 08/19/2018   Lab Results  Component Value Date   TSH 5.723 (H) 12/11/2017   CBC Latest Ref Rng & Units 08/19/2018 07/16/2018 05/20/2018  WBC 3.9 - 10.3 K/uL 5.2 - 5.3  Hemoglobin 11.6 - 15.9 g/dL 10.9(L) 11.6(L) 10.7(L)    Hematocrit 34.8 - 46.6 % 34.7(L) 34.0(L) 32.6(L)  Platelets 145 - 400 K/uL 137(L) - 210   No results found for: CHOL, HDL, LDLCALC, LDLDIRECT, TRIG, CHOLHDL    ASSESSMENT / PLAN: Problem List Items Addressed This Visit    Chronic anticoagulation (Chronic)   Coronary artery disease involving native coronary artery of native heart without angina pectoris (Chronic)    Coronary calcium noted on CT scan, never had a coronary catheterization or coronary CTA.  Negative Myoview with no evidence of ischemia.  At this point, with no angina symptoms, I would be reluctant to do any ischemic evaluation unless she has major surgery scheduled or is having anginal symptoms.  Not on statin.  Probably okay to hold off.  I have not seen labs. Is on stable dose of beta-blocker.  Clearly not on aspirin because of the Eliquis GI bleed issues.      Relevant Medications   hydrALAZINE (APRESOLINE) 25 MG tablet   carvedilol (COREG) 25 MG tablet   amiodarone (PACERONE) 200 MG tablet   apixaban (ELIQUIS) 5 MG TABS tablet   Other Relevant Orders   TSH   Hypertensive heart and chronic kidney disease with heart failure and stage 1 through stage 4 chronic kidney disease, or chronic kidney disease (HCC) (Chronic)    Most recent echo was not adequate to assess diastolic function.  I do suspect that she has diastolic dysfunction that can be exacerbated by A. fib with RVR.  This in combination with her underlying lung disease would make her quite symptomatic. For this reason we are opting for rhythm control for A. fib.  Continue blood pressure control.  Her blood pressure is high today, but she is due to see nephrology soon.  May need to consider titrating up hydralazine dose.  Has usually had relatively well-controlled pressures.  She is on high-dose  carvedilol.  Not on ARB because of renal insufficiency. The most obvious choice to titrate up hydralazine if pressures are high.  Will defer to nephrology on follow-up.       Relevant Medications   hydrALAZINE (APRESOLINE) 25 MG tablet   carvedilol (COREG) 25 MG tablet   amiodarone (PACERONE) 200 MG tablet   apixaban (ELIQUIS) 5 MG TABS tablet   Persistent atrial fibrillation; CHA2DS2Vacs = 6; Eliquis; Amiodarone for rhythm control - Primary (Chronic)    No further breakthrough episodes status post cardioversion.  Maintaining sinus rhythm with amiodarone.  Additionally on beta-blocker for rate control and blood pressure control. No further bleeding with her Eliquis. -->  Probably had some lower GI bleed issues with multiple polyps as well as internal hemorrhoids.        Relevant Medications   hydrALAZINE (APRESOLINE) 25 MG tablet   carvedilol (COREG) 25 MG tablet   amiodarone (PACERONE) 200 MG tablet   apixaban (ELIQUIS) 5 MG TABS tablet   Other Relevant Orders   TSH      I spent a total of 65minutes with the patient and chart review. >  50% of the time was spent in direct patient consultation.   Current medicines are reviewed at length with the patient today.  (+/- concerns) was insistent that she cannot take Lasix.  Otherwise no complaints. The following changes have been made:  None  Patient Instructions  Medication Instructions:  NOT NEEDED  MEDICATION REFILLED If you need a refill on your cardiac medications before your next appointment, please call your pharmacy.   Lab work: TSH - PLEASE HAVE DONE AT PRIMARY OFFICE  If you have labs (blood work) drawn today and your tests are completely normal, you will receive your results only by: Marland Kitchen MyChart Message (if you have MyChart) OR . A paper copy in the mail If you have any lab test that is abnormal or we need to change your treatment, we will call you to review the results.  Testing/Procedures: NOT NEEDED  Follow-Up: [facility] you and your health needs are our priority.  As part of our continuing mission to provide you with exceptional heart care, we have created designated  Provider Care Teams.  These Care Teams include your primary Cardiologist (physician) and Advanced Practice Providers (APPs -  Physician Assistants and Nurse Practitioners) who all work together to provide you with the care you need, when you need it. You will need a follow up appointment in 6 months.  Please call our office 2 months in advance to schedule this appointment.  You may see Glenetta Hew, MD or one of the following Advanced Practice Providers on your designated Care Team:   Rosaria Ferries, PA-C . Jory Sims, DNP, ANP  Any Other Special Instructions Will Be Listed Below (If Applicable).       Studies Ordered:   Orders Placed This Encounter  Procedures  . TSH      Glenetta Hew, M.D., M.S. Interventional Cardiologist   Pager # [phone] Phone # [phone] [address] [place], [place] [address]   Thank you for choosing Heartcare at [facility]!!

## 2018-10-02 NOTE — Assessment & Plan Note (Signed)
No further breakthrough episodes status post cardioversion.  Maintaining sinus rhythm with amiodarone.  Additionally on beta-blocker for rate control and blood pressure control. No further bleeding with her Eliquis. -->  Probably had some lower GI bleed issues with multiple polyps as well as internal hemorrhoids.

## 2018-10-02 NOTE — Assessment & Plan Note (Signed)
Coronary calcium noted on CT scan, never had a coronary catheterization or coronary CTA.  Negative Myoview with no evidence of ischemia.  At this point, with no angina symptoms, I would be reluctant to do any ischemic evaluation unless she has major surgery scheduled or is having anginal symptoms.  Not on statin.  Probably okay to hold off.  I have not seen labs. Is on stable dose of beta-blocker.  Clearly not on aspirin because of the Eliquis GI bleed issues.

## 2018-10-02 NOTE — Assessment & Plan Note (Signed)
Most recent echo was not adequate to assess diastolic function.  I do suspect that she has diastolic dysfunction that can be exacerbated by A. fib with RVR.  This in combination with her underlying lung disease would make her quite symptomatic. For this reason we are opting for rhythm control for A. fib.  Continue blood pressure control.  Her blood pressure is high today, but she is due to see nephrology soon.  May need to consider titrating up hydralazine dose.  Has usually had relatively well-controlled pressures.  She is on high-dose carvedilol.  Not on ARB because of renal insufficiency. The most obvious choice to titrate up hydralazine if pressures are high.  Will defer to nephrology on follow-up.

## 2018-10-02 NOTE — Patient Instructions (Signed)
Medication Instructions:  NOT NEEDED  MEDICATION REFILLED If you need a refill on your cardiac medications before your next appointment, please call your pharmacy.   Lab work: TSH - PLEASE HAVE DONE AT PRIMARY OFFICE  If you have labs (blood work) drawn today and your tests are completely normal, you will receive your results only by: Marland Kitchen MyChart Message (if you have MyChart) OR . A paper copy in the mail If you have any lab test that is abnormal or we need to change your treatment, we will call you to review the results.  Testing/Procedures: NOT NEEDED  Follow-Up: At [facility], you and your health needs are our priority.  As part of our continuing mission to provide you with exceptional heart care, we have created designated Provider Care Teams.  These Care Teams include your primary Cardiologist (physician) and Advanced Practice Providers (APPs -  Physician Assistants and Nurse Practitioners) who all work together to provide you with the care you need, when you need it. You will need a follow up appointment in 6 months.  Please call our office 2 months in advance to schedule this appointment.  You may see Glenetta Hew, MD or one of the following Advanced Practice Providers on your designated Care Team:   Rosaria Ferries, PA-C . Jory Sims, DNP, ANP  Any Other Special Instructions Will Be Listed Below (If Applicable).

## 2018-10-03 ENCOUNTER — Inpatient Hospital Stay: Payer: Medicare Other | Attending: Internal Medicine

## 2018-10-03 DIAGNOSIS — Z95828 Presence of other vascular implants and grafts: Secondary | ICD-10-CM

## 2018-10-03 DIAGNOSIS — Z452 Encounter for adjustment and management of vascular access device: Secondary | ICD-10-CM | POA: Insufficient documentation

## 2018-10-03 DIAGNOSIS — C3491 Malignant neoplasm of unspecified part of right bronchus or lung: Secondary | ICD-10-CM

## 2018-10-03 DIAGNOSIS — Z9221 Personal history of antineoplastic chemotherapy: Secondary | ICD-10-CM | POA: Insufficient documentation

## 2018-10-03 DIAGNOSIS — Z85118 Personal history of other malignant neoplasm of bronchus and lung: Secondary | ICD-10-CM | POA: Diagnosis not present

## 2018-10-03 MED ORDER — SODIUM CHLORIDE 0.9% FLUSH
10.0000 mL | Freq: Once | INTRAVENOUS | Status: AC
  Administered 2018-10-03: 10 mL
  Filled 2018-10-03: qty 10

## 2018-10-03 MED ORDER — HEPARIN SOD (PORK) LOCK FLUSH 100 UNIT/ML IV SOLN
500.0000 [IU] | Freq: Once | INTRAVENOUS | Status: AC | PRN
  Administered 2018-10-03: 500 [IU] via INTRAVENOUS
  Filled 2018-10-03: qty 5

## 2018-10-16 NOTE — Telephone Encounter (Signed)
Pt is requesting information about the B 12 injection.

## 2018-10-18 NOTE — Telephone Encounter (Signed)
Discussed the B12 injections again. She is getting this at her PCP.

## 2018-10-22 LAB — TSH: TSH: 7.14 u[IU]/mL — AB (ref 0.450–4.500)

## 2018-10-23 ENCOUNTER — Other Ambulatory Visit: Payer: Self-pay

## 2018-10-23 DIAGNOSIS — Z79899 Other long term (current) drug therapy: Secondary | ICD-10-CM

## 2018-10-23 NOTE — Progress Notes (Signed)
Diane Man, MD  Raiford Simmonds, RN        TSH is a little high compared to 10 months ago. This could be result of amiodarone. We will need to check free T4 and T3 levels.   If thyroid levels are truly low, with the need to think about replacing thyroid.   Glenetta Hew, MD   pls fwd to PCP: [name], MD   Lab forwarded to PCP via [facility] fax function

## 2018-10-28 LAB — T3: T3 TOTAL: 71 ng/dL (ref 71–180)

## 2018-10-28 LAB — T4: T4 TOTAL: 8.5 ug/dL (ref 4.5–12.0)

## 2018-11-13 ENCOUNTER — Inpatient Hospital Stay: Payer: Medicare Other | Attending: Internal Medicine

## 2018-11-13 DIAGNOSIS — Z9221 Personal history of antineoplastic chemotherapy: Secondary | ICD-10-CM | POA: Diagnosis not present

## 2018-11-13 DIAGNOSIS — Z85118 Personal history of other malignant neoplasm of bronchus and lung: Secondary | ICD-10-CM | POA: Insufficient documentation

## 2018-11-13 DIAGNOSIS — C3491 Malignant neoplasm of unspecified part of right bronchus or lung: Secondary | ICD-10-CM

## 2018-11-13 DIAGNOSIS — Z95828 Presence of other vascular implants and grafts: Secondary | ICD-10-CM

## 2018-11-13 DIAGNOSIS — Z452 Encounter for adjustment and management of vascular access device: Secondary | ICD-10-CM | POA: Diagnosis not present

## 2018-11-13 MED ORDER — HEPARIN SOD (PORK) LOCK FLUSH 100 UNIT/ML IV SOLN
500.0000 [IU] | Freq: Once | INTRAVENOUS | Status: AC
  Administered 2018-11-13: 500 [IU]
  Filled 2018-11-13: qty 5

## 2018-11-13 MED ORDER — SODIUM CHLORIDE 0.9% FLUSH
10.0000 mL | Freq: Once | INTRAVENOUS | Status: AC
  Administered 2018-11-13: 10 mL
  Filled 2018-11-13: qty 10

## 2018-11-29 ENCOUNTER — Emergency Department (HOSPITAL_COMMUNITY): Payer: Medicare Other

## 2018-11-29 ENCOUNTER — Inpatient Hospital Stay (HOSPITAL_COMMUNITY)
Admission: EM | Admit: 2018-11-29 | Discharge: 2018-12-11 | DRG: 493 | Disposition: A | Discharge status: Skilled Nursing Facility | Payer: Medicare Other | Attending: Internal Medicine | Admitting: Internal Medicine

## 2018-11-29 ENCOUNTER — Encounter (HOSPITAL_COMMUNITY): Payer: Self-pay | Admitting: Internal Medicine

## 2018-11-29 DIAGNOSIS — S82852A Displaced trimalleolar fracture of left lower leg, initial encounter for closed fracture: Principal | ICD-10-CM | POA: Diagnosis present

## 2018-11-29 DIAGNOSIS — R609 Edema, unspecified: Secondary | ICD-10-CM

## 2018-11-29 DIAGNOSIS — Z7902 Long term (current) use of antithrombotics/antiplatelets: Secondary | ICD-10-CM

## 2018-11-29 DIAGNOSIS — W108XXA Fall (on) (from) other stairs and steps, initial encounter: Secondary | ICD-10-CM | POA: Diagnosis present

## 2018-11-29 DIAGNOSIS — K5900 Constipation, unspecified: Secondary | ICD-10-CM | POA: Diagnosis present

## 2018-11-29 DIAGNOSIS — Q7292 Unspecified reduction defect of left lower limb: Secondary | ICD-10-CM | POA: Diagnosis not present

## 2018-11-29 DIAGNOSIS — Z9842 Cataract extraction status, left eye: Secondary | ICD-10-CM

## 2018-11-29 DIAGNOSIS — E876 Hypokalemia: Secondary | ICD-10-CM | POA: Diagnosis not present

## 2018-11-29 DIAGNOSIS — Z419 Encounter for procedure for purposes other than remedying health state, unspecified: Secondary | ICD-10-CM

## 2018-11-29 DIAGNOSIS — I2510 Atherosclerotic heart disease of native coronary artery without angina pectoris: Secondary | ICD-10-CM | POA: Diagnosis present

## 2018-11-29 DIAGNOSIS — I130 Hypertensive heart and chronic kidney disease with heart failure and stage 1 through stage 4 chronic kidney disease, or unspecified chronic kidney disease: Secondary | ICD-10-CM | POA: Diagnosis present

## 2018-11-29 DIAGNOSIS — Y92008 Other place in unspecified non-institutional (private) residence as the place of occurrence of the external cause: Secondary | ICD-10-CM

## 2018-11-29 DIAGNOSIS — Z87891 Personal history of nicotine dependence: Secondary | ICD-10-CM

## 2018-11-29 DIAGNOSIS — M7989 Other specified soft tissue disorders: Secondary | ICD-10-CM | POA: Diagnosis present

## 2018-11-29 DIAGNOSIS — D631 Anemia in chronic kidney disease: Secondary | ICD-10-CM | POA: Diagnosis present

## 2018-11-29 DIAGNOSIS — Z9841 Cataract extraction status, right eye: Secondary | ICD-10-CM

## 2018-11-29 DIAGNOSIS — I4819 Other persistent atrial fibrillation: Secondary | ICD-10-CM | POA: Diagnosis present

## 2018-11-29 DIAGNOSIS — C3491 Malignant neoplasm of unspecified part of right bronchus or lung: Secondary | ICD-10-CM

## 2018-11-29 DIAGNOSIS — K219 Gastro-esophageal reflux disease without esophagitis: Secondary | ICD-10-CM | POA: Diagnosis present

## 2018-11-29 DIAGNOSIS — J449 Chronic obstructive pulmonary disease, unspecified: Secondary | ICD-10-CM | POA: Diagnosis present

## 2018-11-29 DIAGNOSIS — Y92009 Unspecified place in unspecified non-institutional (private) residence as the place of occurrence of the external cause: Secondary | ICD-10-CM

## 2018-11-29 DIAGNOSIS — W19XXXA Unspecified fall, initial encounter: Secondary | ICD-10-CM

## 2018-11-29 DIAGNOSIS — Z888 Allergy status to other drugs, medicaments and biological substances status: Secondary | ICD-10-CM

## 2018-11-29 DIAGNOSIS — Z6841 Body Mass Index (BMI) 40.0 and over, adult: Secondary | ICD-10-CM

## 2018-11-29 DIAGNOSIS — S9305XA Dislocation of left ankle joint, initial encounter: Secondary | ICD-10-CM | POA: Diagnosis present

## 2018-11-29 DIAGNOSIS — D509 Iron deficiency anemia, unspecified: Secondary | ICD-10-CM | POA: Diagnosis present

## 2018-11-29 DIAGNOSIS — Z85118 Personal history of other malignant neoplasm of bronchus and lung: Secondary | ICD-10-CM

## 2018-11-29 DIAGNOSIS — I5032 Chronic diastolic (congestive) heart failure: Secondary | ICD-10-CM | POA: Diagnosis present

## 2018-11-29 DIAGNOSIS — Z961 Presence of intraocular lens: Secondary | ICD-10-CM | POA: Diagnosis present

## 2018-11-29 DIAGNOSIS — Z7901 Long term (current) use of anticoagulants: Secondary | ICD-10-CM | POA: Diagnosis not present

## 2018-11-29 DIAGNOSIS — I341 Nonrheumatic mitral (valve) prolapse: Secondary | ICD-10-CM | POA: Diagnosis present

## 2018-11-29 DIAGNOSIS — Z79899 Other long term (current) drug therapy: Secondary | ICD-10-CM

## 2018-11-29 DIAGNOSIS — Z86010 Personal history of colonic polyps: Secondary | ICD-10-CM

## 2018-11-29 DIAGNOSIS — Z8249 Family history of ischemic heart disease and other diseases of the circulatory system: Secondary | ICD-10-CM

## 2018-11-29 DIAGNOSIS — Z825 Family history of asthma and other chronic lower respiratory diseases: Secondary | ICD-10-CM

## 2018-11-29 DIAGNOSIS — S82302A Unspecified fracture of lower end of left tibia, initial encounter for closed fracture: Secondary | ICD-10-CM

## 2018-11-29 DIAGNOSIS — N179 Acute kidney failure, unspecified: Secondary | ICD-10-CM | POA: Diagnosis not present

## 2018-11-29 DIAGNOSIS — D696 Thrombocytopenia, unspecified: Secondary | ICD-10-CM | POA: Diagnosis not present

## 2018-11-29 DIAGNOSIS — Z9981 Dependence on supplemental oxygen: Secondary | ICD-10-CM

## 2018-11-29 DIAGNOSIS — N184 Chronic kidney disease, stage 4 (severe): Secondary | ICD-10-CM | POA: Diagnosis present

## 2018-11-29 LAB — I-STAT CHEM 8, ED
BUN: 34 mg/dL — ABNORMAL HIGH (ref 8–23)
CREATININE: 2.4 mg/dL — AB (ref 0.44–1.00)
Calcium, Ion: 1.15 mmol/L (ref 1.15–1.40)
Chloride: 99 mmol/L (ref 98–111)
Glucose, Bld: 112 mg/dL — ABNORMAL HIGH (ref 70–99)
HCT: 35 % — ABNORMAL LOW (ref 36.0–46.0)
Hemoglobin: 11.9 g/dL — ABNORMAL LOW (ref 12.0–15.0)
Potassium: 4 mmol/L (ref 3.5–5.1)
Sodium: 138 mmol/L (ref 135–145)
TCO2: 30 mmol/L (ref 22–32)

## 2018-11-29 LAB — COMPREHENSIVE METABOLIC PANEL
ALT: 15 U/L (ref 0–44)
AST: 20 U/L (ref 15–41)
Albumin: 3.3 g/dL — ABNORMAL LOW (ref 3.5–5.0)
Alkaline Phosphatase: 44 U/L (ref 38–126)
Anion gap: 8 (ref 5–15)
BUN: 32 mg/dL — ABNORMAL HIGH (ref 8–23)
CHLORIDE: 100 mmol/L (ref 98–111)
CO2: 31 mmol/L (ref 22–32)
Calcium: 9.4 mg/dL (ref 8.9–10.3)
Creatinine, Ser: 2.38 mg/dL — ABNORMAL HIGH (ref 0.44–1.00)
GFR calc Af Amer: 22 mL/min — ABNORMAL LOW (ref >60)
GFR calc non Af Amer: 19 mL/min — ABNORMAL LOW (ref >60)
Glucose, Bld: 116 mg/dL — ABNORMAL HIGH (ref 70–99)
Potassium: 3.9 mmol/L (ref 3.5–5.1)
SODIUM: 139 mmol/L (ref 135–145)
Total Bilirubin: 0.8 mg/dL (ref 0.3–1.2)
Total Protein: 7 g/dL (ref 6.5–8.1)

## 2018-11-29 LAB — CBC
HEMATOCRIT: 36 % (ref 36.0–46.0)
Hemoglobin: 11.5 g/dL — ABNORMAL LOW (ref 12.0–15.0)
MCH: 31.3 pg (ref 26.0–34.0)
MCHC: 31.9 g/dL (ref 30.0–36.0)
MCV: 98.1 fL (ref 80.0–100.0)
Platelets: 190 10*3/uL (ref 150–400)
RBC: 3.67 MIL/uL — ABNORMAL LOW (ref 3.87–5.11)
RDW: 13.7 % (ref 11.5–15.5)
WBC: 8.7 10*3/uL (ref 4.0–10.5)
nRBC: 0 % (ref 0.0–0.2)

## 2018-11-29 LAB — PROTIME-INR
INR: 1.44
Prothrombin Time: 17.4 seconds — ABNORMAL HIGH (ref 11.4–15.2)

## 2018-11-29 LAB — SAMPLE TO BLOOD BANK

## 2018-11-29 LAB — CDS SEROLOGY

## 2018-11-29 LAB — I-STAT CG4 LACTIC ACID, ED: Lactic Acid, Venous: 1.05 mmol/L (ref 0.5–1.9)

## 2018-11-29 LAB — ETHANOL: Alcohol, Ethyl (B): <10 mg/dL (ref <10)

## 2018-11-29 MED ORDER — FENTANYL CITRATE (PF) 100 MCG/2ML IJ SOLN
50.0000 ug | INTRAMUSCULAR | Status: DC | PRN
  Administered 2018-11-29: 50 ug via INTRAVENOUS
  Filled 2018-11-29: qty 2

## 2018-11-29 MED ORDER — CALCITRIOL 0.5 MCG PO CAPS
0.5000 ug | ORAL_CAPSULE | Freq: Every day | ORAL | Status: DC
  Administered 2018-11-30 – 2018-12-11 (×11): 0.5 ug via ORAL
  Filled 2018-11-29 (×11): qty 1

## 2018-11-29 MED ORDER — CARVEDILOL 25 MG PO TABS
25.0000 mg | ORAL_TABLET | Freq: Two times a day (BID) | ORAL | Status: DC
  Administered 2018-11-30 – 2018-12-11 (×24): 25 mg via ORAL
  Filled 2018-11-29: qty 1
  Filled 2018-11-29 (×2): qty 2
  Filled 2018-11-29 (×5): qty 1
  Filled 2018-11-29 (×3): qty 2
  Filled 2018-11-29 (×2): qty 1
  Filled 2018-11-29 (×4): qty 2
  Filled 2018-11-29: qty 1
  Filled 2018-11-29: qty 2
  Filled 2018-11-29 (×2): qty 1
  Filled 2018-11-29 (×3): qty 2

## 2018-11-29 MED ORDER — APIXABAN 5 MG PO TABS
5.0000 mg | ORAL_TABLET | Freq: Two times a day (BID) | ORAL | Status: DC
  Administered 2018-11-29 – 2018-12-01 (×4): 5 mg via ORAL
  Filled 2018-11-29 (×4): qty 1

## 2018-11-29 MED ORDER — HYDRALAZINE HCL 25 MG PO TABS
25.0000 mg | ORAL_TABLET | Freq: Two times a day (BID) | ORAL | Status: DC
  Administered 2018-11-29 – 2018-12-11 (×23): 25 mg via ORAL
  Filled 2018-11-29 (×23): qty 1

## 2018-11-29 MED ORDER — CARVEDILOL 12.5 MG PO TABS: 25.0000 mg | ORAL_TABLET | Freq: Two times a day (BID) | ORAL | Status: DC

## 2018-11-29 MED ORDER — AMIODARONE HCL 100 MG PO TABS
200.0000 mg | ORAL_TABLET | Freq: Every day | ORAL | Status: DC
  Administered 2018-11-30 – 2018-12-11 (×11): 200 mg via ORAL
  Filled 2018-11-29 (×11): qty 2

## 2018-11-29 MED ORDER — TORSEMIDE 20 MG PO TABS
20.0000 mg | ORAL_TABLET | ORAL | Status: AC
  Administered 2018-11-29: 20 mg via ORAL
  Filled 2018-11-29: qty 1

## 2018-11-29 MED ORDER — PROPOFOL 10 MG/ML IV BOLUS
1.0000 mg/kg | Freq: Once | INTRAVENOUS | Status: AC
  Administered 2018-11-29: 50 mg via INTRAVENOUS
  Filled 2018-11-29: qty 20

## 2018-11-29 MED ORDER — HYDROCODONE-ACETAMINOPHEN 5-325 MG PO TABS
1.0000 | ORAL_TABLET | Freq: Four times a day (QID) | ORAL | Status: DC | PRN
  Administered 2018-11-29 – 2018-11-30 (×2): 2 via ORAL
  Filled 2018-11-29 (×2): qty 2

## 2018-11-29 MED ORDER — ACETAMINOPHEN 500 MG PO TABS
1000.0000 mg | ORAL_TABLET | Freq: Once | ORAL | Status: DC
  Filled 2018-11-29: qty 2

## 2018-11-29 MED ORDER — UMECLIDINIUM-VILANTEROL 62.5-25 MCG/INH IN AEPB
1.0000 | INHALATION_SPRAY | Freq: Every day | RESPIRATORY_TRACT | Status: DC
  Administered 2018-11-30 – 2018-12-11 (×12): 1 via RESPIRATORY_TRACT
  Filled 2018-11-29 (×2): qty 14

## 2018-11-29 MED ORDER — WHITE PETROLATUM EX OINT
TOPICAL_OINTMENT | CUTANEOUS | Status: AC
  Administered 2018-11-30
  Filled 2018-11-29: qty 56.7

## 2018-11-29 MED ORDER — MELATONIN 3 MG PO TABS
9.0000 mg | ORAL_TABLET | Freq: Every day | ORAL | Status: DC
  Administered 2018-11-29: 9 mg via ORAL
  Filled 2018-11-29 (×2): qty 3

## 2018-11-29 MED ORDER — FENTANYL CITRATE (PF) 100 MCG/2ML IJ SOLN: 25.0000 ug | Freq: Once | INTRAMUSCULAR | Status: DC

## 2018-11-29 MED ORDER — FENTANYL CITRATE (PF) 100 MCG/2ML IJ SOLN
25.0000 ug | INTRAMUSCULAR | Status: DC | PRN
  Administered 2018-11-30 (×2): 25 ug via INTRAVENOUS
  Filled 2018-11-29 (×2): qty 2

## 2018-11-29 NOTE — ED Triage Notes (Signed)
Pt BIB [facility] from home. Pt was attempting to enter her home when she tripped on the step, fell backwards, hit her head, and twisted her left ankle. No LOC. On eloquis. A/Ox4. Hx CHF, Kidney failure, and lung cancer. VSS for EMS.

## 2018-11-29 NOTE — H&P (Addendum)
History and Physical    Diane [name] NTI:[id number] DOB: 05-Nov-1941 DOA: 11/29/2018  Referring MD/NP/PA: Hulan Saas, MD (resident) PCP: [name], MD  Patient coming from: Home via EMS  Chief Complaint: Fall  I have personally briefly reviewed patient's old medical records in [place]   HPI: Diane [name] is a 78 y.o. female with medical history significant of COPD on 2 L of nasal cannula oxygen with activity, diastolic CHF, A. fib on Eliquis, CAD, MVP, HTN, HLD, and lung cancer s/p chemo followed by [name]; who presents after having a fall at home.  Patient reports that she had just gotten back from the beauty shop and was coming up the steps.  She put her hand on the windowsill as she normally does to help get up the steps, but her hand slipped.  Patient fell backwards hitting her head on the concrete and twisting her left ankle.  She did not lose consciousness, but reported excruciating pain in swelling of the left ankle.  Her husband with whom she lives was unable to help her up and called EMS.  Patient reports that otherwise she has no complaints.  Denies fever, chills, chest pain,  ED Course: Upon admission into the emergency department patient was seen to have blood pressures 167/69-225/78, and all other vitals maintained.  Labs revealed WBC 8.7, hemoglobin 11.5, BUN 32, creatinine 2.38, and INR 1.44.  CT scans of the cervical spine and head did not reveal any signs of any acute abnormality.  Chest x-ray appears stable with a right basilar opacity thought to be collecting system versus scarring with associated effusion.  Patient sustained a left trimalleolar fracture with lateral and posterior dislocation along with tibial and fibular distal fractures.  Dr. [name] of orthopedics was consulted and recommended reduction and possible discharge home to follow-up as outpatient.  Patient underwent reduction with the use of propofol.  However, [facility] called to admit is  unable to be discharged home due to condition.  Patient also received 20 mg of furosemide, 50 mcg of fentanyl, and 5 mg of Tylenol.  Review of Systems  Constitutional: Negative for fever and malaise/fatigue.  HENT: Negative for ear discharge and tinnitus.   Eyes: Negative for photophobia and pain.  Respiratory: Negative for cough and shortness of breath.   Cardiovascular: Positive for leg swelling. Negative for chest pain.  Gastrointestinal: Negative for abdominal pain, nausea and vomiting.  Genitourinary: Negative for frequency.  Musculoskeletal: Positive for falls and joint pain.  Skin: Negative for itching and rash.  Neurological: Negative for focal weakness and loss of consciousness.  Psychiatric/Behavioral: Negative for hallucinations and substance abuse.    Past Medical History:  Diagnosis Date  . Anemia   . Aortic atherosclerosis ([place]) 12/03/2017   Noted on CT scan  . CAD (coronary artery disease), native coronary artery 12/03/2017   Noted on CT scan by calcification   . CHF (congestive heart failure) ([place])   . Chronic diastolic heart failure ([place]) 12/03/2017  . Dyspnea   . GERD (gastroesophageal reflux disease)   . Heart murmur   . History of blood transfusion    "related to chemo" (12/05/2017)  . Hypertension   . Hypertensive heart disease 12/03/2017  . Kidney disease, chronic, stage III (GFR 30-59 ml/min) ([place]) 12/03/2017  . Lung cancer ([place]) dx'd 02/2010  . Mitral valve prolapse   . Morbid obesity ([place]) 12/03/2017  . On home oxygen therapy   . 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  Paroxysmal atrial fibrillation ([facility])   . Persistent atrial fibrillation 12/03/2017  . Restrictive lung disease 05/23/2017    Past Surgical History:  Procedure Laterality Date  . BIOPSY  07/16/2018   Procedure: BIOPSY;  Surgeon: Mauri Pole, MD;  Location: [facility];  Service: Endoscopy;;  . CARDIOVERSION N/A 12/06/2017   Procedure: CARDIOVERSION;  Surgeon: Jacolyn Reedy, MD;   Location: [facility] ENDOSCOPY;  Service: Cardiovascular;  Laterality: N/A;  . CATARACT EXTRACTION W/ INTRAOCULAR LENS  IMPLANT, BILATERAL Bilateral   . COLONOSCOPY WITH PROPOFOL N/A 07/16/2018   Procedure: COLONOSCOPY WITH PROPOFOL;  Surgeon: Mauri Pole, MD;  Location: [facility] ENDOSCOPY;  Service: Endoscopy;  Laterality: N/A;  . DERMABRASION  ~ 1966   "face; for acne"  . ESOPHAGOGASTRODUODENOSCOPY (EGD) WITH PROPOFOL N/A 07/16/2018   Procedure: ESOPHAGOGASTRODUODENOSCOPY (EGD) WITH PROPOFOL;  Surgeon: Mauri Pole, MD;  Location: [facility] ENDOSCOPY;  Service: Endoscopy;  Laterality: N/A;  . PAROTID GLAND TUMOR EXCISION Left   . POLYPECTOMY  07/16/2018   Procedure: POLYPECTOMY;  Surgeon: Mauri Pole, MD;  Location: [facility] ENDOSCOPY;  Service: Endoscopy;;  . PORTACATH PLACEMENT  11/03/08-[place]   tip in mid SVC-E. [place]  . THORACENTESIS Right   . TONSILLECTOMY    . VIDEO BRONCHOSCOPY WITH ENDOBRONCHIAL ULTRASOUND Right 05/08/2018   Procedure: VIDEO BRONCHOSCOPY WITH ENDOBRONCHIAL ULTRASOUND;  Surgeon: Collene Gobble, MD;  Location: [facility];  Service: Thoracic;  Laterality: Right;     reports that she quit smoking about 17 years ago. Her smoking use included cigarettes. She has a 80.00 pack-year smoking history. She has never used smokeless tobacco. She reports current alcohol use. She reports that she does not use drugs.  Allergies  Allergen Reactions  . Septra Ds [Sulfamethoxazole-Trimethoprim] Nausea Only    Family History  Problem Relation Age of Onset  . Breast cancer Mother   . Heart attack Mother   . Diabetes Father   . Heart attack Father   . Diabetes Brother   . COPD Cousin   . Colon cancer Neg Hx     Prior to Admission medications   Medication Sig Start Date End Date Taking? Authorizing Provider  acetaminophen (TYLENOL) 500 MG tablet Take 1,000 mg by mouth at bedtime.   Yes [provider]  albuterol (PROAIR HFA) 108 (90 Base) MCG/ACT inhaler Inhale 2 puffs  into the lungs every 4 (four) hours as needed for wheezing or shortness of breath. 07/08/18  Yes Collene Gobble, MD  amiodarone (PACERONE) 200 MG tablet Take 1 tablet (200 mg total) by mouth daily. 10/02/18  Yes Leonie Man, MD  ANORO ELLIPTA 62.5-25 MCG/INH AEPB INHALE 1 PUFF BY MOUTH INTO LUNGS ONCE DAILY Patient taking differently: Inhale 1 puff into the lungs daily.  09/19/18  Yes Collene Gobble, MD  apixaban (ELIQUIS) 5 MG TABS tablet Take 1 tablet (5 mg total) by mouth 2 (two) times daily. OK to restart on Friday 05/10/18. 10/02/18  Yes Leonie Man, MD  calcitRIOL (ROCALTROL) 0.5 MCG capsule Take 0.5 mcg by mouth daily.  09/27/18  Yes [provider]  carvedilol (COREG) 25 MG tablet Take 1 tablet (25 mg total) by mouth 2 (two) times daily with a meal. 10/02/18  Yes Leonie Man, MD  cyanocobalamin (,VITAMIN B-12,) 1000 MCG/ML injection Inject 1,000 mcg into the muscle every 30 (thirty) days.  09/18/18  Yes [provider]  hydrALAZINE (APRESOLINE) 25 MG tablet Take 1 tablet (25 mg total) by mouth 2 (two) times daily. 10/02/18  Yes Leonie Man, MD  hydroxypropyl methylcellulose / hypromellose (ISOPTO TEARS / GONIOVISC) 2.5 % ophthalmic solution Place 1 drop into both eyes 2 (two) times daily as needed for dry eyes.    Yes [provider]  Melatonin 10 MG TABS Take 10 mg by mouth daily.   Yes [provider]  polyethylene glycol (MIRALAX / GLYCOLAX) packet Take 17 g by mouth at bedtime.    Yes [provider]  torsemide (DEMADEX) 20 MG tablet Take 2 tablets (40 mg total) by mouth 2 (two) times daily with a meal. 07/31/18  Yes Leonie Man, MD    Physical Exam:  Constitutional: Obese elderly female in NAD, calm, comfortable Vitals:   11/29/18 1438 11/29/18 1600 11/29/18 1645 11/29/18 1847  BP:  (!) 167/69 (!) 199/91 (!) 186/73  Pulse: 88 91 93 93  Resp:  18  17  SpO2: 96% 98% 96% 100%  Weight:      Height:       Eyes: PERRL,  lids and conjunctivae normal ENMT: Mucous membranes are dry. Posterior pharynx clear of any exudate or lesions.  Neck: normal, supple, no masses, no thyromegaly Respiratory: clear to auscultation bilaterally, no wheezing, no crackles. Normal respiratory effort. No accessory muscle use.  Patient on 2 L nasal cannula oxygen currently. Cardiovascular: Regular rate and rhythm, no murmurs / rubs / gallops.  No significant extremity edema of the right lower extremity.. 2+ pedal pulses. No carotid bruits.  Abdomen: no tenderness, no masses palpated. No hepatosplenomegaly. Bowel sounds positive.  Musculoskeletal: no clubbing / cyanosis.  Left leg and ankle casted currently. Skin: no rashes, lesions, ulcers. No induration Neurologic: CN 2-12 grossly intact. Sensation intact, DTR normal. Strength 5/5 in all 4.  Psychiatric: Normal judgment and insight. Alert and oriented x 3. Normal mood.     Labs on Admission: I have personally reviewed following labs and imaging studies  CBC: Recent Labs  Lab 11/29/18 1506 11/29/18 1618  WBC 8.7  --   HGB 11.5* 11.9*  HCT 36.0 35.0*  MCV 98.1  --   PLT 190  --    Basic Metabolic Panel: Recent Labs  Lab 11/29/18 1506 11/29/18 1618  NA 139 138  K 3.9 4.0  CL 100 99  CO2 31  --   GLUCOSE 116* 112*  BUN 32* 34*  CREATININE 2.38* 2.40*  CALCIUM 9.4  --    GFR: Estimated Creatinine Clearance: 24.3 mL/min (A) (by C-G formula based on SCr of 2.4 mg/dL (H)). Liver Function Tests: Recent Labs  Lab 11/29/18 1506  AST 20  ALT 15  ALKPHOS 44  BILITOT 0.8  PROT 7.0  ALBUMIN 3.3*   No results for input(s): LIPASE, AMYLASE in the last 168 hours. No results for input(s): AMMONIA in the last 168 hours. Coagulation Profile: Recent Labs  Lab 11/29/18 1506  INR 1.44   Cardiac Enzymes: No results for input(s): CKTOTAL, CKMB, CKMBINDEX, TROPONINI in the last 168 hours. BNP (last 3 results) No results for input(s): PROBNP in the last 8760  hours. HbA1C: No results for input(s): HGBA1C in the last 72 hours. CBG: No results for input(s): GLUCAP in the last 168 hours. Lipid Profile: No results for input(s): CHOL, HDL, LDLCALC, TRIG, CHOLHDL, LDLDIRECT in the last 72 hours. Thyroid Function Tests: No results for input(s): TSH, T4TOTAL, FREET4, T3FREE, THYROIDAB in the last 72 hours. Anemia Panel: No results for input(s): VITAMINB12, FOLATE, FERRITIN, TIBC, IRON, RETICCTPCT in the last 72 hours. Urine analysis:  Component Value Date/Time   COLORURINE YELLOW 12/11/2017 1530   APPEARANCEUR CLEAR 12/11/2017 1530   LABSPEC 1.008 12/11/2017 1530   PHURINE 5.0 12/11/2017 1530   GLUCOSEU NEGATIVE 12/11/2017 1530   HGBUR SMALL (A) 12/11/2017 1530   BILIRUBINUR NEGATIVE 12/11/2017 1530   KETONESUR NEGATIVE 12/11/2017 1530   PROTEINUR NEGATIVE 12/11/2017 1530   UROBILINOGEN 1.0 12/05/2009 1303   NITRITE NEGATIVE 12/11/2017 1530   LEUKOCYTESUR NEGATIVE 12/11/2017 1530   Sepsis Labs: No results found for this or any previous visit (from the past 240 hour(s)).   Radiological Exams on Admission: Dg Tibia/fibula Left  Result Date: 11/29/2018 CLINICAL DATA:  Left leg pain after fall. EXAM: LEFT TIBIA AND FIBULA - 2 VIEW COMPARISON:  None. FINDINGS: Severely displaced medial malleolar fracture is noted. Moderately displaced oblique fracture is seen involving distal fibula. Lateral and posterior dislocation of talus relative to tibia is noted. IMPRESSION: Lateral and posterior dislocation of talus relative to distal tibia is noted with associated severely displaced medial malleolar fracture and moderately displaced distal fibular fracture Electronically Signed   By: Marijo Conception, M.D.   On: 11/29/2018 16:11   Dg Ankle Complete Left  Result Date: 11/29/2018 CLINICAL DATA:  Left ankle pain after fall. EXAM: LEFT ANKLE COMPLETE - 3+ VIEW COMPARISON:  None. FINDINGS: Lateral posterior dislocation of left talus relative to distal left  tibia is noted. Severely displaced medial malleolar fracture is noted. Moderately displaced and comminuted fracture is seen involving the distal left fibula. Also noted is moderately displaced posterior malleolar fracture. IMPRESSION: Trimalleolar fracture is noted with associated lateral and posterior dislocation of talus relative to distal tibia. Electronically Signed   By: Marijo Conception, M.D.   On: 11/29/2018 16:13   Ct Head Wo Contrast  Result Date: 11/29/2018 CLINICAL DATA:  Fall.  On Eliquis.  History of lung cancer. EXAM: CT HEAD WITHOUT CONTRAST CT CERVICAL SPINE WITHOUT CONTRAST TECHNIQUE: Multidetector CT imaging of the head and cervical spine was performed following the standard protocol without intravenous contrast. Multiplanar CT image reconstructions of the cervical spine were also generated. COMPARISON:  CT chest dated August 19, 2018. MR brain dated Apr 12, 2015. CT head dated March 16, 2011. CT neck dated August 12, 2009. FINDINGS: CT HEAD FINDINGS Brain: No evidence of acute infarction, hemorrhage, hydrocephalus, extra-axial collection or mass lesion/mass effect. Stable small lacunar infarct in the right centrum semiovale. Stable mild atrophy. Vascular: Atherosclerotic vascular calcification of the carotid siphons. No hyperdense vessel. Skull: Normal. Negative for fracture or focal lesion. Sinuses/Orbits: No acute finding. Mild ethmoid air cell mucosal thickening. Other: None. CT CERVICAL SPINE FINDINGS Alignment: Slight reversal of the normal cervical lordosis. No traumatic malalignment. Skull base and vertebrae: No acute fracture. No primary bone lesion or focal pathologic process. Soft tissues and spinal canal: No prevertebral fluid or swelling. Unchanged medial deviation of the bilateral internal carotid arteries. No visible canal hematoma. Disc levels: Mild disc height loss at C3-C4. Moderate disc height loss and mild uncovertebral hypertrophy from C4-C5 through C6-C7. Upper chest:  Centrilobular emphysema. Unchanged nodular architectural distortion in the right upper lobe. Other: None. IMPRESSION: 1. No acute intracranial abnormality. Unchanged small lacunar infarct in the right centrum semiovale. 2. No acute cervical spine fracture. Moderate cervical spondylosis. Electronically Signed   By: Titus Dubin M.D.   On: 11/29/2018 17:12   Ct Cervical Spine Wo Contrast  Result Date: 11/29/2018 CLINICAL DATA:  Fall.  On Eliquis.  History of lung cancer. EXAM: CT HEAD WITHOUT  CONTRAST CT CERVICAL SPINE WITHOUT CONTRAST TECHNIQUE: Multidetector CT imaging of the head and cervical spine was performed following the standard protocol without intravenous contrast. Multiplanar CT image reconstructions of the cervical spine were also generated. COMPARISON:  CT chest dated August 19, 2018. MR brain dated Apr 12, 2015. CT head dated March 16, 2011. CT neck dated August 12, 2009. FINDINGS: CT HEAD FINDINGS Brain: No evidence of acute infarction, hemorrhage, hydrocephalus, extra-axial collection or mass lesion/mass effect. Stable small lacunar infarct in the right centrum semiovale. Stable mild atrophy. Vascular: Atherosclerotic vascular calcification of the carotid siphons. No hyperdense vessel. Skull: Normal. Negative for fracture or focal lesion. Sinuses/Orbits: No acute finding. Mild ethmoid air cell mucosal thickening. Other: None. CT CERVICAL SPINE FINDINGS Alignment: Slight reversal of the normal cervical lordosis. No traumatic malalignment. Skull base and vertebrae: No acute fracture. No primary bone lesion or focal pathologic process. Soft tissues and spinal canal: No prevertebral fluid or swelling. Unchanged medial deviation of the bilateral internal carotid arteries. No visible canal hematoma. Disc levels: Mild disc height loss at C3-C4. Moderate disc height loss and mild uncovertebral hypertrophy from C4-C5 through C6-C7. Upper chest: Centrilobular emphysema. Unchanged nodular  architectural distortion in the right upper lobe. Other: None. IMPRESSION: 1. No acute intracranial abnormality. Unchanged small lacunar infarct in the right centrum semiovale. 2. No acute cervical spine fracture. Moderate cervical spondylosis. Electronically Signed   By: Titus Dubin M.D.   On: 11/29/2018 17:12   Dg Pelvis Portable  Result Date: 11/29/2018 CLINICAL DATA:  Fall. EXAM: PORTABLE PELVIS 1-2 VIEWS COMPARISON:  None. FINDINGS: There is no evidence of pelvic fracture or diastasis. No pelvic bone lesions are seen. IMPRESSION: Negative. Electronically Signed   By: Marijo Conception, M.D.   On: 11/29/2018 16:06   Dg Chest Port 1 View  Result Date: 11/29/2018 CLINICAL DATA:  Fall. EXAM: PORTABLE CHEST 1 VIEW COMPARISON:  Radiograph of May 08, 2018. FINDINGS: Stable cardiomegaly. Left-sided Port-A-Cath is unchanged in position. No pneumothorax is noted. Left lung is clear. Stable right basilar opacity is noted concerning for atelectasis or scarring with associated effusion. Bony thorax is unremarkable. IMPRESSION: Stable right basilar opacity is noted concerning for atelectasis or scarring with associated effusion. Electronically Signed   By: Marijo Conception, M.D.   On: 11/29/2018 16:09   Dg Foot Complete Left  Result Date: 11/29/2018 CLINICAL DATA:  Left foot pain after fall. EXAM: LEFT FOOT - COMPLETE 3+ VIEW COMPARISON:  None. FINDINGS: Lateral and posterior dislocation of talus relative to distal tibia is noted. Severely displaced medial malleolar fracture is noted. Moderately displaced distal fibular fracture is noted. No other bony abnormality is noted. IMPRESSION: Distal tibial and fibular fractures are noted with lateral and posterior dislocation of talus relative to distal tibia. Electronically Signed   By: Marijo Conception, M.D.   On: 11/29/2018 16:12    Chest x-ray: Independently reviewed.  Checks x-ray does not show any clear signs of edema  Assessment/Plan Fall with left ankle  fracture: Acute.  Patient appears to have had a mechanical fall at home onto the concrete.  X-rays reveal trimalleolar, distal tibia and fibular fractures.  Patient underwent suction with palpable in the emergency department.  Orthopedics Dr. [name] consulted but recommended outpatient follow-up.  Patient unable to ambulate or get into her home as there are 3 steps. - Admit to a MedSurg bed - Utilize a hip fracture order set - Hydrocodone/fentanyl as needed moderate to severe pain respectively -Physical therapy to  eval and treat - Social work and care management consults - Appreciate orthopedic consultative services, will follow-up for further recommendation  Acute kidney injury superimposed on chronic kidney disease stage IV: Patient presents with a creatinine of 2.4 with BUN 34.  Baseline creatinine previously noted to be around 2. - Hold nephrotoxic agents - Give gentle IV fluids of normal saline at 75 mL/h - Continue to monitor kidney function  Diastolic HF: Chronic.  Patient appears to be euvolemic at this time.  Last EF noted to be 65 to 70% by echocardiogram on 12/12/2017. - Strict I&O's and daily weights  COPD, without acute exacerbation: Patient reports being on oxygen of 2 L with activity.  O2 saturations maintained currently. - Continuous pulse oximetry with nasal cannula oxygen - Continue Anoro Ellipta - DuoNeb's as needed for shortness of breath/wheezing  Adenocarcinoma of the lung: Patient followed in the outpatient setting by [name].  Currently not receiving treatment - Continue outpatient follow-up  Persistent atrial fibrillation on chronic anticoagulation:CHA2DS2-VASc score = 6 - Continue amiodarone and Eliquis   Essential hypertension - Continue hydralazine and Coreg  Morbid obesity, BMI 41.6  DVT prophylaxis: Eliquis Code Status: Full Family Communication: No family present at bedside Disposition Plan: To be determined Consults called:  Orthopedics Admission status: Observation  Norval Morton MD [facility] Pager [phone]   If 7PM-7AM, please contact night-coverage [email or web address] Password [facility]  11/29/2018, 7:38 PM

## 2018-11-29 NOTE — Progress Notes (Signed)
I have been in conversation with the emergency department providers regarding Diane [name] and her left ankle injury.  She has been appropriately reduced in the emergency department and is currently in an acceptable position.  She has been admitted to the hospitalist service for medical issues that prevented her from discharge home.  I will place a formal consult note on the chart in the morning.  Her condition is one that will need surgical management.  Unfortunately, given her chronic blood thinner usage and swelling following the ankle injury and subsequent reduction this is not a surgical procedure that can be performed in the immediate future.  Typically we would like to wait 5 to 7 days to allow for swelling to resolve before proceeding with surgical fixation and also to allow for a plan of action to be placed regarding her anticoagulation.  Again full consult note to come tomorrow.  For now elevate strictly at all times the lower extremity and nonweightbearing to the left lower extremity.

## 2018-11-29 NOTE — ED Provider Notes (Signed)
[facility] EMERGENCY DEPARTMENT Provider Note   CSN: [id number] Arrival date & time: 11/29/18  1430     History   Chief Complaint Chief Complaint  Patient presents with  . Fall    HPI Diane [name] is a 78 y.o. female.  HPI   Is a 78 year old female with a past medical history of CHF currently on 3 L nasal cannula at home at baseline, A. fib currently on Eliquis, HTN, HDL, lung cancer, mitral valve prolapse, morbid obesity, CAD, and GERD who presents via EMS for further evaluation after ground-level mechanical fall that occurred earlier today.  Patient states she was walking up her stoop when she slipped striking the back of her head against the pavement and twisting her left ankle.  She states she only felt pain in her left ankle and did not lose consciousness or experience any headache or neck pain after the fall.  She states she has had difficulty bearing weight on her left lower extremity but denies pain in any other extremity or her abdomen or chest.  She denies any recent illnesses including any recent fevers, cough, shortness of breath, chest pain, abdominal pain, vomiting, diarrhea, dysuria, or other acute complaints.  States she is compliant with her medications.  States her pain is currently 10 out of 10 in intensity.  And denies alleviating or aggravating factors.  Denies prior similar episodes.  Past Medical History:  Diagnosis Date  . Anemia   . Aortic atherosclerosis ([place]) 12/03/2017   Noted on CT scan  . CAD (coronary artery disease), native coronary artery 12/03/2017   Noted on CT scan by calcification   . CHF (congestive heart failure) ([place])   . Chronic diastolic heart failure ([place]) 12/03/2017  . Dyspnea   . GERD (gastroesophageal reflux disease)   . Heart murmur   . History of blood transfusion    "related to chemo" (12/05/2017)  . Hypertension   . Hypertensive heart disease 12/03/2017  . Kidney disease, chronic, stage III (GFR 30-59 ml/min) ([place])  12/03/2017  . Lung cancer ([place]) dx'd 02/2010  . Mitral valve prolapse   . Morbid obesity ([place]) 12/03/2017  . On home oxygen therapy   . 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 Paroxysmal atrial fibrillation (HCC)   . Persistent atrial fibrillation 12/03/2017  . Restrictive lung disease 05/23/2017    Patient Active Problem List   Diagnosis Date Noted  . Polyp of cecum   . Polyp of ascending colon   . Polyp of transverse colon   . Polyp of descending colon   . Polyp of rectum   . Heme positive stool 06/11/2018  . Oxygen dependent 06/11/2018  . Status post bronchoscopy with biopsy   . Anemia 04/25/2018  . Chronic respiratory failure with hypoxia ([place]) 01/17/2018  . Hypotension 12/13/2017  . COPD (chronic obstructive pulmonary disease) ([place]) 12/11/2017  . Chronic diastolic heart failure ([place]) 12/03/2017  . Persistent atrial fibrillation; CHA2DS2Vacs = 6; Eliquis; Amiodarone for rhythm control 12/03/2017  . Chronic anticoagulation 12/03/2017  . Morbid obesity ([place]) 12/03/2017  . Kidney disease, chronic, stage III (GFR 30-59 ml/min) (HCC) 12/03/2017  . Hypertensive heart and chronic kidney disease with heart failure and stage 1 through stage 4 chronic kidney disease, or chronic kidney disease ([place]) 12/03/2017  . Coronary artery disease involving native coronary artery of native heart without angina pectoris 12/03/2017  . Aortic atherosclerosis ([place]) 12/03/2017  . Restrictive lung disease 05/23/2017  . Port catheter in place Oct 24, 202017  .  Cancer of right lung parenchyma ([place]) 10/17/2011    Past Surgical History:  Procedure Laterality Date  . BIOPSY  07/16/2018   Procedure: BIOPSY;  Surgeon: Mauri Pole, MD;  Location: [facility];  Service: Endoscopy;;  . CARDIOVERSION N/A 12/06/2017   Procedure: CARDIOVERSION;  Surgeon: Jacolyn Reedy, MD;  Location: [facility] ENDOSCOPY;  Service: Cardiovascular;  Laterality: N/A;  . CATARACT EXTRACTION W/ INTRAOCULAR LENS  IMPLANT, BILATERAL  Bilateral   . COLONOSCOPY WITH PROPOFOL N/A 07/16/2018   Procedure: COLONOSCOPY WITH PROPOFOL;  Surgeon: Mauri Pole, MD;  Location: [facility] ENDOSCOPY;  Service: Endoscopy;  Laterality: N/A;  . DERMABRASION  ~ 1966   "face; for acne"  . ESOPHAGOGASTRODUODENOSCOPY (EGD) WITH PROPOFOL N/A 07/16/2018   Procedure: ESOPHAGOGASTRODUODENOSCOPY (EGD) WITH PROPOFOL;  Surgeon: Mauri Pole, MD;  Location: [facility] ENDOSCOPY;  Service: Endoscopy;  Laterality: N/A;  . PAROTID GLAND TUMOR EXCISION Left   . POLYPECTOMY  07/16/2018   Procedure: POLYPECTOMY;  Surgeon: Mauri Pole, MD;  Location: [facility] ENDOSCOPY;  Service: Endoscopy;;  . PORTACATH PLACEMENT  11/03/08-[place]   tip in mid SVC-E. [place]  . THORACENTESIS Right   . TONSILLECTOMY    . VIDEO BRONCHOSCOPY WITH ENDOBRONCHIAL ULTRASOUND Right 05/08/2018   Procedure: VIDEO BRONCHOSCOPY WITH ENDOBRONCHIAL ULTRASOUND;  Surgeon: Collene Gobble, MD;  Location: [facility] OR;  Service: Thoracic;  Laterality: Right;     OB History   No obstetric history on file.      Home Medications    Prior to Admission medications   Medication Sig Start Date End Date Taking? Authorizing Provider  acetaminophen (TYLENOL) 500 MG tablet Take 1,000 mg by mouth at bedtime.   Yes [provider]  albuterol (PROAIR HFA) 108 (90 Base) MCG/ACT inhaler Inhale 2 puffs into the lungs every 4 (four) hours as needed for wheezing or shortness of breath. 07/08/18  Yes Collene Gobble, MD  amiodarone (PACERONE) 200 MG tablet Take 1 tablet (200 mg total) by mouth daily. 10/02/18  Yes Leonie Man, MD  ANORO ELLIPTA 62.5-25 MCG/INH AEPB INHALE 1 PUFF BY MOUTH INTO LUNGS ONCE DAILY Patient taking differently: Inhale 1 puff into the lungs daily.  09/19/18  Yes Collene Gobble, MD  apixaban (ELIQUIS) 5 MG TABS tablet Take 1 tablet (5 mg total) by mouth 2 (two) times daily. OK to restart on Friday 05/10/18. 10/02/18  Yes Leonie Man, MD  calcitRIOL (ROCALTROL) 0.5 MCG  capsule Take 0.5 mcg by mouth daily.  09/27/18  Yes [provider]  carvedilol (COREG) 25 MG tablet Take 1 tablet (25 mg total) by mouth 2 (two) times daily with a meal. 10/02/18  Yes Leonie Man, MD  cyanocobalamin (,VITAMIN B-12,) 1000 MCG/ML injection Inject 1,000 mcg into the muscle every 30 (thirty) days.  09/18/18  Yes [provider]  hydrALAZINE (APRESOLINE) 25 MG tablet Take 1 tablet (25 mg total) by mouth 2 (two) times daily. 10/02/18  Yes Leonie Man, MD  hydroxypropyl methylcellulose / hypromellose (ISOPTO TEARS / GONIOVISC) 2.5 % ophthalmic solution Place 1 drop into both eyes 2 (two) times daily as needed for dry eyes.    Yes [provider]  Melatonin 10 MG TABS Take 10 mg by mouth daily.   Yes [provider]  polyethylene glycol (MIRALAX / GLYCOLAX) packet Take 17 g by mouth at bedtime.    Yes [provider]  torsemide (DEMADEX) 20 MG tablet Take 2 tablets (40 mg total) by mouth 2 (two) times daily  with a meal. 07/31/18  Yes Leonie Man, MD    Family History Family History  Problem Relation Age of Onset  . Breast cancer Mother   . Heart attack Mother   . Diabetes Father   . Heart attack Father   . Diabetes Brother   . COPD Cousin   . Colon cancer Neg Hx     Social History Social History   Tobacco Use  . Smoking status: Former Smoker    Packs/day: 2.00    Years: 40.00    Pack years: 80.00    Types: Cigarettes    Last attempt to quit: 11/27/2001    Years since quitting: 17.0  . Smokeless tobacco: Never Used  Substance Use Topics  . Alcohol use: Yes    Comment: 12/05/2017 "once in a great while I might have a drink"  . Drug use: No     Allergies   Septra ds [sulfamethoxazole-trimethoprim]   Review of Systems Review of Systems  Constitutional: Negative for chills and fever.  HENT: Negative for ear pain and sore throat.   Eyes: Negative for pain and visual disturbance.  Respiratory: Negative for cough  and shortness of breath.   Cardiovascular: Negative for chest pain and palpitations.  Gastrointestinal: Negative for abdominal pain and vomiting.  Genitourinary: Negative for dysuria and hematuria.  Musculoskeletal: Positive for arthralgias ( L ankle), gait problem and myalgias ( L ankle). Negative for back pain.  Skin: Negative for color change and rash.  Neurological: Negative for seizures and syncope.  All other systems reviewed and are negative.   Physical Exam Updated Vital Signs BP (!) 179/81   Pulse 95   Resp 16   Ht 5\' 5"  (1.651 m)   Wt 113.4 kg   SpO2 97%   BMI 41.60 kg/m   Physical Exam Vitals signs and nursing note reviewed.  Constitutional:      General: She is not in acute distress.    Appearance: Normal appearance. She is well-developed and normal weight.  HENT:     Head: Normocephalic and atraumatic.     Right Ear: External ear normal.     Left Ear: External ear normal.     Nose: Nose normal.     Mouth/Throat:     Mouth: Mucous membranes are moist.  Eyes:     Conjunctiva/sclera: Conjunctivae normal.  Neck:     Musculoskeletal: Neck supple.  Cardiovascular:     Rate and Rhythm: Normal rate and regular rhythm.     Pulses: Normal pulses.          Radial pulses are 2+ on the right side and 2+ on the left side.       Dorsalis pedis pulses are 2+ on the right side and 2+ on the left side.     Heart sounds: No murmur.  Pulmonary:     Effort: Pulmonary effort is normal. No respiratory distress.     Breath sounds: Normal breath sounds.  Abdominal:     Palpations: Abdomen is soft.     Tenderness: There is no abdominal tenderness.  Musculoskeletal:     Left ankle: She exhibits decreased range of motion, swelling and ecchymosis.     Cervical back: She exhibits no bony tenderness.     Thoracic back: She exhibits no bony tenderness.     Lumbar back: She exhibits no bony tenderness.  Skin:    General: Skin is warm and dry.  Neurological:     Mental Status: She  is  alert.      ED Treatments / Results  Labs (all labs ordered are listed, but only abnormal results are displayed) Labs Reviewed  COMPREHENSIVE METABOLIC PANEL - Abnormal; Notable for the following components:      Result Value   Glucose, Bld 116 (*)    BUN 32 (*)    Creatinine, Ser 2.38 (*)    Albumin 3.3 (*)    GFR calc non Af Amer 19 (*)    GFR calc Af Amer 22 (*)    All other components within normal limits  CBC - Abnormal; Notable for the following components:   RBC 3.67 (*)    Hemoglobin 11.5 (*)    All other components within normal limits  PROTIME-INR - Abnormal; Notable for the following components:   Prothrombin Time 17.4 (*)    All other components within normal limits  I-STAT CHEM 8, ED - Abnormal; Notable for the following components:   BUN 34 (*)    Creatinine, Ser 2.40 (*)    Glucose, Bld 112 (*)    Hemoglobin 11.9 (*)    HCT 35.0 (*)    All other components within normal limits  ETHANOL  CDS SEROLOGY  URINALYSIS, ROUTINE W REFLEX MICROSCOPIC  I-STAT CG4 LACTIC ACID, ED  SAMPLE TO BLOOD BANK    EKG None  Radiology Dg Tibia/fibula Left  Result Date: 11/29/2018 CLINICAL DATA:  Left leg pain after fall. EXAM: LEFT TIBIA AND FIBULA - 2 VIEW COMPARISON:  None. FINDINGS: Severely displaced medial malleolar fracture is noted. Moderately displaced oblique fracture is seen involving distal fibula. Lateral and posterior dislocation of talus relative to tibia is noted. IMPRESSION: Lateral and posterior dislocation of talus relative to distal tibia is noted with associated severely displaced medial malleolar fracture and moderately displaced distal fibular fracture Electronically Signed   By: Marijo Conception, M.D.   On: 11/29/2018 16:11   Dg Ankle Complete Left  Result Date: 11/29/2018 CLINICAL DATA:  Left ankle pain after fall. EXAM: LEFT ANKLE COMPLETE - 3+ VIEW COMPARISON:  None. FINDINGS: Lateral posterior dislocation of left talus relative to distal left tibia  is noted. Severely displaced medial malleolar fracture is noted. Moderately displaced and comminuted fracture is seen involving the distal left fibula. Also noted is moderately displaced posterior malleolar fracture. IMPRESSION: Trimalleolar fracture is noted with associated lateral and posterior dislocation of talus relative to distal tibia. Electronically Signed   By: Marijo Conception, M.D.   On: 11/29/2018 16:13   Ct Head Wo Contrast  Result Date: 11/29/2018 CLINICAL DATA:  Fall.  On Eliquis.  History of lung cancer. EXAM: CT HEAD WITHOUT CONTRAST CT CERVICAL SPINE WITHOUT CONTRAST TECHNIQUE: Multidetector CT imaging of the head and cervical spine was performed following the standard protocol without intravenous contrast. Multiplanar CT image reconstructions of the cervical spine were also generated. COMPARISON:  CT chest dated August 19, 2018. MR brain dated Apr 12, 2015. CT head dated March 16, 2011. CT neck dated August 12, 2009. FINDINGS: CT HEAD FINDINGS Brain: No evidence of acute infarction, hemorrhage, hydrocephalus, extra-axial collection or mass lesion/mass effect. Stable small lacunar infarct in the right centrum semiovale. Stable mild atrophy. Vascular: Atherosclerotic vascular calcification of the carotid siphons. No hyperdense vessel. Skull: Normal. Negative for fracture or focal lesion. Sinuses/Orbits: No acute finding. Mild ethmoid air cell mucosal thickening. Other: None. CT CERVICAL SPINE FINDINGS Alignment: Slight reversal of the normal cervical lordosis. No traumatic malalignment. Skull base and vertebrae: No acute fracture. No primary  bone lesion or focal pathologic process. Soft tissues and spinal canal: No prevertebral fluid or swelling. Unchanged medial deviation of the bilateral internal carotid arteries. No visible canal hematoma. Disc levels: Mild disc height loss at C3-C4. Moderate disc height loss and mild uncovertebral hypertrophy from C4-C5 through C6-C7. Upper chest:  Centrilobular emphysema. Unchanged nodular architectural distortion in the right upper lobe. Other: None. IMPRESSION: 1. No acute intracranial abnormality. Unchanged small lacunar infarct in the right centrum semiovale. 2. No acute cervical spine fracture. Moderate cervical spondylosis. Electronically Signed   By: Titus Dubin M.D.   On: 11/29/2018 17:12   Ct Cervical Spine Wo Contrast  Result Date: 11/29/2018 CLINICAL DATA:  Fall.  On Eliquis.  History of lung cancer. EXAM: CT HEAD WITHOUT CONTRAST CT CERVICAL SPINE WITHOUT CONTRAST TECHNIQUE: Multidetector CT imaging of the head and cervical spine was performed following the standard protocol without intravenous contrast. Multiplanar CT image reconstructions of the cervical spine were also generated. COMPARISON:  CT chest dated August 19, 2018. MR brain dated Apr 12, 2015. CT head dated March 16, 2011. CT neck dated August 12, 2009. FINDINGS: CT HEAD FINDINGS Brain: No evidence of acute infarction, hemorrhage, hydrocephalus, extra-axial collection or mass lesion/mass effect. Stable small lacunar infarct in the right centrum semiovale. Stable mild atrophy. Vascular: Atherosclerotic vascular calcification of the carotid siphons. No hyperdense vessel. Skull: Normal. Negative for fracture or focal lesion. Sinuses/Orbits: No acute finding. Mild ethmoid air cell mucosal thickening. Other: None. CT CERVICAL SPINE FINDINGS Alignment: Slight reversal of the normal cervical lordosis. No traumatic malalignment. Skull base and vertebrae: No acute fracture. No primary bone lesion or focal pathologic process. Soft tissues and spinal canal: No prevertebral fluid or swelling. Unchanged medial deviation of the bilateral internal carotid arteries. No visible canal hematoma. Disc levels: Mild disc height loss at C3-C4. Moderate disc height loss and mild uncovertebral hypertrophy from C4-C5 through C6-C7. Upper chest: Centrilobular emphysema. Unchanged nodular  architectural distortion in the right upper lobe. Other: None. IMPRESSION: 1. No acute intracranial abnormality. Unchanged small lacunar infarct in the right centrum semiovale. 2. No acute cervical spine fracture. Moderate cervical spondylosis. Electronically Signed   By: Titus Dubin M.D.   On: 11/29/2018 17:12   Dg Pelvis Portable  Result Date: 11/29/2018 CLINICAL DATA:  Fall. EXAM: PORTABLE PELVIS 1-2 VIEWS COMPARISON:  None. FINDINGS: There is no evidence of pelvic fracture or diastasis. No pelvic bone lesions are seen. IMPRESSION: Negative. Electronically Signed   By: Marijo Conception, M.D.   On: 11/29/2018 16:06   Dg Chest Port 1 View  Result Date: 11/29/2018 CLINICAL DATA:  Fall. EXAM: PORTABLE CHEST 1 VIEW COMPARISON:  Radiograph of May 08, 2018. FINDINGS: Stable cardiomegaly. Left-sided Port-A-Cath is unchanged in position. No pneumothorax is noted. Left lung is clear. Stable right basilar opacity is noted concerning for atelectasis or scarring with associated effusion. Bony thorax is unremarkable. IMPRESSION: Stable right basilar opacity is noted concerning for atelectasis or scarring with associated effusion. Electronically Signed   By: Marijo Conception, M.D.   On: 11/29/2018 16:09   Dg Ankle Left Port  Result Date: 11/29/2018 CLINICAL DATA:  Post reduction imaging, left ankle fractures. EXAM: PORTABLE LEFT ANKLE - 2 VIEW COMPARISON:  Pre reduction images obtained on 11/29/2018 at 1526 hours FINDINGS: Since the earlier study, there is been significant interval reduction. Talus has been mostly reduced with only 3-4 mm of lateral subluxation persisting. The distal fibula and medial malleolar fracture components have likewise been reduced with  only mild residual displacement, medial malleolus of 3-4 mm. Ankle is encased in a plaster splint or cast. IMPRESSION: 1. Significant reduction of the trimalleolar fracture following closed reduction. Mild residual displacement and lateral talar subluxation  of 3-4 mm persists. Electronically Signed   By: Lajean Manes M.D.   On: 11/29/2018 19:47   Dg Foot Complete Left  Result Date: 11/29/2018 CLINICAL DATA:  Left foot pain after fall. EXAM: LEFT FOOT - COMPLETE 3+ VIEW COMPARISON:  None. FINDINGS: Lateral and posterior dislocation of talus relative to distal tibia is noted. Severely displaced medial malleolar fracture is noted. Moderately displaced distal fibular fracture is noted. No other bony abnormality is noted. IMPRESSION: Distal tibial and fibular fractures are noted with lateral and posterior dislocation of talus relative to distal tibia. Electronically Signed   By: Marijo Conception, M.D.   On: 11/29/2018 16:12    Procedures .Sedation Date/Time: 11/29/2018 7:28 PM Performed by: Hulan Saas, MD Authorized by: Hulan Saas, MD   Consent:    Consent obtained:  Written   Consent given by:  Patient   Risks discussed:  Allergic reaction, inadequate sedation, nausea, vomiting, respiratory compromise necessitating ventilatory assistance and intubation, prolonged sedation necessitating reversal, prolonged hypoxia resulting in organ damage and dysrhythmia   Alternatives discussed:  Analgesia without sedation Indications:    Procedure performed:  Fracture reduction   Procedure necessitating sedation performed by:  Physician performing sedation Pre-sedation assessment:    NPO status caution: unable to specify NPO status     ASA classification: class 3 - patient with severe systemic disease     Neck mobility: normal     Mallampati score:  II - soft palate, uvula, fauces visible   Pre-sedation assessments completed and reviewed: airway patency, cardiovascular function, mental status, nausea/vomiting, pain level and respiratory function   Immediate pre-procedure details:    Verified: bag valve mask available, emergency equipment available, intubation equipment available, IV patency confirmed, oxygen available and suction available   Procedure  details (see MAR for exact dosages):    Preoxygenation:  Nasal cannula   Sedation:  Propofol   Intra-procedure monitoring:  Blood pressure monitoring, cardiac monitor, continuous capnometry, continuous pulse oximetry, frequent LOC assessments and frequent vital sign checks   Intra-procedure events: hypoxia     Intra-procedure management:  Airway repositioning and BVM ventilation   Total Provider sedation time (minutes):  15 Post-procedure details:    Post-sedation assessments completed and reviewed: airway patency, cardiovascular function, mental status, nausea/vomiting and temperature     Patient is stable for discharge or admission: yes     Patient tolerance:  Tolerated well, no immediate complications  Reduction of fracture Date/Time: 11/29/2018 7:41 PM Performed by: Hulan Saas, MD Authorized by: Hulan Saas, MD  Consent: Verbal consent obtained. Consent given by: patient Patient understanding: patient states understanding of the procedure being performed Patient identity confirmed: verbally with patient and arm band Local anesthesia used: no  Anesthesia: Local anesthesia used: no  Sedation: Patient sedated: yes Sedatives: propofol Vitals: Vital signs were monitored during sedation.  Patient tolerance: Patient tolerated the procedure well with no immediate complications    (including critical care time)  Medications Ordered in ED Medications  fentaNYL (SUBLIMAZE) injection 50 mcg (50 mcg Intravenous Given 11/29/18 1848)  acetaminophen (TYLENOL) tablet 1,000 mg (1,000 mg Oral Not Given 11/29/18 1849)  torsemide (DEMADEX) tablet 20 mg (20 mg Oral Given 11/29/18 1853)  propofol (DIPRIVAN) 10 mg/mL bolus/IV push 113.4 mg (50 mg Intravenous Given 11/29/18 1917)  Initial Impression / Assessment and Plan / ED Course  I have reviewed the triage vital signs and the nursing notes.  Pertinent labs & imaging results that were available during my care of the patient were reviewed  by me and considered in my medical decision making (see chart for details).     Patient is a 78 year old female who presents with above-stated history exam.  On presentation patient is noted to be afebrile stable vital signs.  Exam as above remarkable for edema, swelling, and decreased drinking range of motion at the left ankle.  Patient otherwise has no focal neurological deficits, lungs clear to auscultation bilaterally, abdomen soft nontender, and no C, T, or L-spine tenderness.  Given age and risk factors a CT head and neck were obtained.  The studies did not show any acute fractures or bleeding or other acute traumatic injuries in the head or C-spine.  Chest and pelvic x-rays did not show any acute traumatic injuries.  X-ray left tib-fib shows "Lateral and posterior dislocation of talus relative to distal tibia is noted with associated severely displaced medial malleolar fracture and moderately displaced distal fibular fracture"  X-ray left ankle shows "Trimalleolar fracture is noted with associated lateral and posterior dislocation of talus relative to distal tibia."  X-ray left foot shows "Distal tibial and fibular fractures are noted with lateral and posterior dislocation of talus relative to distal tibia."  Patient given IV analgesia in the ED.  History exam is not consistent with syncope, ACS, PE, other acute traumatic injuries, or other imminently threatening pathology.  Patient was discussed with on-call orthopedist Dr. [name] who recommended bedside reduction.  Reduction performed under propofol sedation as detailed above.  Patient admitted to hospital service in stable condition for PT and OT.     Final Clinical Impressions(s) / ED Diagnoses   Final diagnoses:  Reduction defect of left lower extremity  Closed fracture of distal end of left tibia, unspecified fracture morphology, initial encounter  Closed trimalleolar fracture of left ankle, initial encounter    ED  Discharge Orders    None       Hulan Saas, MD 11/29/18 Casimer Lanius    Elnora Morrison, MD 11/30/18 [phone]

## 2018-11-29 NOTE — ED Notes (Signed)
Patient had brief period of apnea and oxygen saturation decreased to 80%. Jaw thrust chin tilt was performed to provide airway support. Respiratory assistance provided with bag valve mask for approx 1-2 minutes. Patient's respiratory effort increased as sedation wore off and she no longer needed assistance with respirations. Vital signs stable post sedation.

## 2018-11-29 NOTE — ED Notes (Signed)
Portable xray @bedside

## 2018-11-30 ENCOUNTER — Other Ambulatory Visit: Payer: Self-pay

## 2018-11-30 DIAGNOSIS — Y92009 Unspecified place in unspecified non-institutional (private) residence as the place of occurrence of the external cause: Secondary | ICD-10-CM

## 2018-11-30 DIAGNOSIS — W19XXXA Unspecified fall, initial encounter: Secondary | ICD-10-CM

## 2018-11-30 DIAGNOSIS — S82852A Displaced trimalleolar fracture of left lower leg, initial encounter for closed fracture: Secondary | ICD-10-CM | POA: Diagnosis not present

## 2018-11-30 LAB — URINALYSIS, ROUTINE W REFLEX MICROSCOPIC
Bilirubin Urine: NEGATIVE
GLUCOSE, UA: NEGATIVE mg/dL
Hgb urine dipstick: NEGATIVE
Ketones, ur: NEGATIVE mg/dL
LEUKOCYTES UA: NEGATIVE
Nitrite: NEGATIVE
PH: 5 (ref 5.0–8.0)
Protein, ur: NEGATIVE mg/dL
Specific Gravity, Urine: 1.012 (ref 1.005–1.030)

## 2018-11-30 LAB — CBC
HCT: 30.2 % — ABNORMAL LOW (ref 36.0–46.0)
Hemoglobin: 9.6 g/dL — ABNORMAL LOW (ref 12.0–15.0)
MCH: 31 pg (ref 26.0–34.0)
MCHC: 31.8 g/dL (ref 30.0–36.0)
MCV: 97.4 fL (ref 80.0–100.0)
Platelets: 173 10*3/uL (ref 150–400)
RBC: 3.1 MIL/uL — ABNORMAL LOW (ref 3.87–5.11)
RDW: 14 % (ref 11.5–15.5)
WBC: 6.8 10*3/uL (ref 4.0–10.5)
nRBC: 0 % (ref 0.0–0.2)

## 2018-11-30 LAB — BASIC METABOLIC PANEL
Anion gap: 9 (ref 5–15)
Anion gap: 8 (ref 5–15)
BUN: 35 mg/dL — AB (ref 8–23)
BUN: 42 mg/dL — ABNORMAL HIGH (ref 8–23)
CO2: 27 mmol/L (ref 22–32)
CO2: 26 mmol/L (ref 22–32)
Calcium: 8.9 mg/dL (ref 8.9–10.3)
Calcium: 8.8 mg/dL — ABNORMAL LOW (ref 8.9–10.3)
Chloride: 101 mmol/L (ref 98–111)
Chloride: 98 mmol/L (ref 98–111)
Creatinine, Ser: 2.63 mg/dL — ABNORMAL HIGH (ref 0.44–1.00)
Creatinine, Ser: 3.19 mg/dL — ABNORMAL HIGH (ref 0.44–1.00)
GFR calc Af Amer: 19 mL/min — ABNORMAL LOW (ref >60)
GFR calc Af Amer: 15 mL/min — ABNORMAL LOW (ref >60)
GFR calc non Af Amer: 17 mL/min — ABNORMAL LOW (ref >60)
GFR calc non Af Amer: 13 mL/min — ABNORMAL LOW (ref >60)
Glucose, Bld: 103 mg/dL — ABNORMAL HIGH (ref 70–99)
Glucose, Bld: 157 mg/dL — ABNORMAL HIGH (ref 70–99)
Potassium: 3.6 mmol/L (ref 3.5–5.1)
Potassium: 3.3 mmol/L — ABNORMAL LOW (ref 3.5–5.1)
Sodium: 137 mmol/L (ref 135–145)
Sodium: 132 mmol/L — ABNORMAL LOW (ref 135–145)

## 2018-11-30 MED ORDER — MELATONIN 3 MG PO TABS
9.0000 mg | ORAL_TABLET | Freq: Every day | ORAL | Status: DC
  Administered 2018-11-30 – 2018-12-10 (×11): 9 mg via ORAL
  Filled 2018-11-30 (×12): qty 3

## 2018-11-30 MED ORDER — POLYETHYLENE GLYCOL 3350 17 G PO PACK
17.0000 g | PACK | Freq: Every day | ORAL | Status: DC
  Administered 2018-11-30 – 2018-12-02 (×3): 17 g via ORAL
  Filled 2018-11-30 (×3): qty 1

## 2018-11-30 MED ORDER — SODIUM CHLORIDE 0.9 % IV SOLN
Freq: Once | INTRAVENOUS | Status: AC
  Administered 2018-11-30: 06:00:00 via INTRAVENOUS

## 2018-11-30 MED ORDER — OXYCODONE HCL 5 MG PO TABS
5.0000 mg | ORAL_TABLET | ORAL | Status: DC | PRN
  Administered 2018-11-30 – 2018-12-10 (×20): 5 mg via ORAL
  Filled 2018-11-30 (×20): qty 1

## 2018-11-30 MED ORDER — LACTATED RINGERS IV SOLN
INTRAVENOUS | Status: AC
  Administered 2018-11-30: 12:00:00 via INTRAVENOUS

## 2018-11-30 MED ORDER — ACETAMINOPHEN 325 MG PO TABS
650.0000 mg | ORAL_TABLET | Freq: Four times a day (QID) | ORAL | Status: DC
  Administered 2018-11-30 – 2018-12-11 (×39): 650 mg via ORAL
  Filled 2018-11-30 (×39): qty 2

## 2018-11-30 NOTE — Progress Notes (Signed)
PROGRESS NOTE    Diane [name] Diane [name]  EGB:[id number] DOB: December 01, 1940 DOA: 11/29/2018 PCP: Diane Downing, MD   Brief Narrative:  Per HPI Diane [name] is Diane [name] 78 y.o. female with medical history significant of COPD on 2 L of nasal cannula oxygen with activity, diastolic CHF, Diane [name]. fib on Eliquis, CAD, MVP, HTN, HLD, and lung cancer s/p chemo followed by Dr. Earlie [name]; who presents after having Diane [name] fall at home.  Patient reports that she had just gotten back from the beauty shop and was coming up the steps.  She put her hand on the windowsill as she normally does to help get up the steps, but her hand slipped.  Patient fell backwards hitting her head on the concrete and twisting her left ankle.  She did not lose consciousness, but reported excruciating pain in swelling of the left ankle.  Her husband with whom she lives was unable to help her up and called EMS.  Patient reports that otherwise she has no complaints.     Assessment & Plan:   Principal Problem:   Trimalleolar fracture of ankle, closed, left, initial encounter Active Problems:   Cancer of right lung parenchyma (HCC)   Chronic diastolic heart failure (HCC)   Persistent atrial fibrillation; CHA2DS2Vacs = 6; Eliquis; Amiodarone for rhythm control   Chronic anticoagulation   Morbid obesity (HCC)   COPD (chronic obstructive pulmonary disease) ([place])   Fall at home, initial encounter   Fall with left ankle fracture:  Acute.  S/p mechanical fall at home onto the concrete.  X-rays reveal trimalleolar, distal tibia and fibular fractures.   Patient underwent closed reduction in the ED Orthopedics Dr. Stann [name] consulted but recommended outpatient follow-up initially, but pt admitted as she was unable to ambulate.  PT recommending SNF.  Will pursue this after surgery on Friday, 1/10. - Appreciate Dr. Stann [name] consultation, due to swelling, holding off on definitive fixation, surgery planned tentatively for Friday 1/10.  Eliquis will need to be held  48 hours prior to surgical intervention. - oxycodone prn, tylenol scheduled, bowel regimen - Physical therapy to eval and treat - Social work and care management consults - Appreciate orthopedic consultative services, will follow-up for further recommendation  Acute kidney injury superimposed on chronic kidney disease stage IV: Patient presented with Diane [name] creatinine of 2.4 with BUN 34.  Baseline creatinine previously noted to be around 2. - creatinine worsening today, of note, pt did get dose of torsemide on 1/3 (unclear reason) - continue IVF for now, if renal function not improving, will check renal US - Follow urinalysis - Hold nephrotoxic agents - Give gentle IV fluids of normal saline at 75 mL/h - Continue to monitor kidney function  Persistent atrial fibrillation on chronic anticoagulation:CHA2DS2-VASc score = 6 - Continue amiodarone - continue eliquis for now, but will need close attention given AKI above - will plan to stop this on Tuesday night in preparation for surgery on Friday.  Diastolic HF: Chronic.  Patient appears to be euvolemic at this time.  Last EF noted to be 65 to 70% by echocardiogram on 12/12/2017. - Strict I&O's and daily weights  COPD, without acute exacerbation: Patient reports being on oxygen of 2 L with activity.  O2 saturations maintained currently. - Continuous pulse oximetry with nasal cannula oxygen - Continue Anoro Ellipta - DuoNeb's as needed for shortness of breath/wheezing  Adenocarcinoma of the lung: Patient followed in the outpatient setting by Dr. Earlie [name].  Currently not receiving treatment - Continue outpatient follow-up  Essential hypertension - Continue hydralazine and Coreg  Morbid obesity, BMI 41.6   DVT prophylaxis: eliquis Code Status: full  Family Communication: none at bedside Disposition Plan: pending surgery.  Pt unsafe for discharge.  She'll require SNF placement at discharge and is currently awaiting surgery on 1/10.   Continues to require inpatient management.    Consultants:   Orthopedic surgery  Procedures:   Closed reduction of fracture 1/3  Antimicrobials: Anti-infectives (From admission, onward)   None     Subjective: Pain ok.  Worried about plan going forward.  Objective: Vitals:   11/29/18 2131 11/30/18 0546 11/30/18 0820 11/30/18 1411  BP: (!) 163/59 (!) 131/49  (!) 118/52  Pulse: 97 95  79  Resp: 16   16  Temp: 99.6 F (37.6 C) 98.4 F (36.9 C)  98.6 F (37 C)  TempSrc: Oral Oral  Oral  SpO2: 91% 99% 99% 99%  Weight:      Height:        Intake/Output Summary (Last 24 hours) at 11/30/2018 1709 Last data filed at 11/30/2018 1611 Gross per 24 hour  Intake 1814 ml  Output 50 ml  Net 1764 ml   Filed Weights   11/29/18 1436  Weight: 113.4 kg    Examination:  General exam: Appears calm and comfortable  Respiratory system: Clear to auscultation. Respiratory effort normal. Cardiovascular system: S1 & S2 heard, RRR Gastrointestinal system: Abdomen is nondistended, soft and nontender. Central nervous system: Alert and oriented. No focal neurological deficits. Extremities: LLE in dressing Skin: No rashes, lesions or ulcers Psychiatry: Judgement and insight appear normal. Mood & affect appropriate.     Data Reviewed: I have personally reviewed following labs and imaging studies  CBC: Recent Labs  Lab 11/29/18 1506 11/29/18 1618 11/30/18 0357  WBC 8.7  --  6.8  HGB 11.5* 11.9* 9.6*  HCT 36.0 35.0* 30.2*  MCV 98.1  --  97.4  PLT 190  --  144   Basic Metabolic Panel: Recent Labs  Lab 11/29/18 1506 11/29/18 1618 11/30/18 0357  NA 139 138 137  K 3.9 4.0 3.6  CL 100 99 101  CO2 31  --  27  GLUCOSE 116* 112* 103*  BUN 32* 34* 35*  CREATININE 2.38* 2.40* 2.63*  CALCIUM 9.4  --  8.9   GFR: Estimated Creatinine Clearance: 22.2 mL/min (Diane [name]) (by C-G formula based on SCr of 2.63 mg/dL (H)). Liver Function Tests: Recent Labs  Lab 11/29/18 1506  AST 20  ALT  15  ALKPHOS 44  BILITOT 0.8  PROT 7.0  ALBUMIN 3.3*   No results for input(s): LIPASE, AMYLASE in the last 168 hours. No results for input(s): AMMONIA in the last 168 hours. Coagulation Profile: Recent Labs  Lab 11/29/18 1506  INR 1.44   Cardiac Enzymes: No results for input(s): CKTOTAL, CKMB, CKMBINDEX, TROPONINI in the last 168 hours. BNP (last 3 results) No results for input(s): PROBNP in the last 8760 hours. HbA1C: No results for input(s): HGBA1C in the last 72 hours. CBG: No results for input(s): GLUCAP in the last 168 hours. Lipid Profile: No results for input(s): CHOL, HDL, LDLCALC, TRIG, CHOLHDL, LDLDIRECT in the last 72 hours. Thyroid Function Tests: No results for input(s): TSH, T4TOTAL, FREET4, T3FREE, THYROIDAB in the last 72 hours. Anemia Panel: No results for input(s): VITAMINB12, FOLATE, FERRITIN, TIBC, IRON, RETICCTPCT in the last 72 hours. Sepsis Labs: Recent Labs  Lab 11/29/18 1618  LATICACIDVEN 1.05    No results found for this or  any previous visit (from the past 240 hour(s)).       Radiology Studies: Dg Tibia/fibula Left  Result Date: 11/29/2018 CLINICAL DATA:  Left leg pain after fall. EXAM: LEFT TIBIA AND FIBULA - 2 VIEW COMPARISON:  None. FINDINGS: Severely displaced medial malleolar fracture is noted. Moderately displaced oblique fracture is seen involving distal fibula. Lateral and posterior dislocation of talus relative to tibia is noted. IMPRESSION: Lateral and posterior dislocation of talus relative to distal tibia is noted with associated severely displaced medial malleolar fracture and moderately displaced distal fibular fracture Electronically Signed   By: Marijo Conception, M.D.   On: 11/29/2018 16:11   Dg Ankle Complete Left  Result Date: 11/29/2018 CLINICAL DATA:  Left ankle pain after fall. EXAM: LEFT ANKLE COMPLETE - 3+ VIEW COMPARISON:  None. FINDINGS: Lateral posterior dislocation of left talus relative to distal left tibia is noted.  Severely displaced medial malleolar fracture is noted. Moderately displaced and comminuted fracture is seen involving the distal left fibula. Also noted is moderately displaced posterior malleolar fracture. IMPRESSION: Trimalleolar fracture is noted with associated lateral and posterior dislocation of talus relative to distal tibia. Electronically Signed   By: Marijo Conception, M.D.   On: 11/29/2018 16:13   Ct Head Wo Contrast  Result Date: 11/29/2018 CLINICAL DATA:  Fall.  On Eliquis.  History of lung cancer. EXAM: CT HEAD WITHOUT CONTRAST CT CERVICAL SPINE WITHOUT CONTRAST TECHNIQUE: Multidetector CT imaging of the head and cervical spine was performed following the standard protocol without intravenous contrast. Multiplanar CT image reconstructions of the cervical spine were also generated. COMPARISON:  CT chest dated August 19, 2018. MR brain dated Apr 12, 2015. CT head dated March 16, 2011. CT neck dated August 12, 2009. FINDINGS: CT HEAD FINDINGS Brain: No evidence of acute infarction, hemorrhage, hydrocephalus, extra-axial collection or mass lesion/mass effect. Stable small lacunar infarct in the right centrum semiovale. Stable mild atrophy. Vascular: Atherosclerotic vascular calcification of the carotid siphons. No hyperdense vessel. Skull: Normal. Negative for fracture or focal lesion. Sinuses/Orbits: No acute finding. Mild ethmoid air cell mucosal thickening. Other: None. CT CERVICAL SPINE FINDINGS Alignment: Slight reversal of the normal cervical lordosis. No traumatic malalignment. Skull base and vertebrae: No acute fracture. No primary bone lesion or focal pathologic process. Soft tissues and spinal canal: No prevertebral fluid or swelling. Unchanged medial deviation of the bilateral internal carotid arteries. No visible canal hematoma. Disc levels: Mild disc height loss at C3-C4. Moderate disc height loss and mild uncovertebral hypertrophy from C4-C5 through C6-C7. Upper chest: Centrilobular  emphysema. Unchanged nodular architectural distortion in the right upper lobe. Other: None. IMPRESSION: 1. No acute intracranial abnormality. Unchanged small lacunar infarct in the right centrum semiovale. 2. No acute cervical spine fracture. Moderate cervical spondylosis. Electronically Signed   By: Titus Dubin M.D.   On: 11/29/2018 17:12   Ct Cervical Spine Wo Contrast  Result Date: 11/29/2018 CLINICAL DATA:  Fall.  On Eliquis.  History of lung cancer. EXAM: CT HEAD WITHOUT CONTRAST CT CERVICAL SPINE WITHOUT CONTRAST TECHNIQUE: Multidetector CT imaging of the head and cervical spine was performed following the standard protocol without intravenous contrast. Multiplanar CT image reconstructions of the cervical spine were also generated. COMPARISON:  CT chest dated August 19, 2018. MR brain dated Apr 12, 2015. CT head dated March 16, 2011. CT neck dated August 12, 2009. FINDINGS: CT HEAD FINDINGS Brain: No evidence of acute infarction, hemorrhage, hydrocephalus, extra-axial collection or mass lesion/mass effect. Stable small lacunar infarct  in the right centrum semiovale. Stable mild atrophy. Vascular: Atherosclerotic vascular calcification of the carotid siphons. No hyperdense vessel. Skull: Normal. Negative for fracture or focal lesion. Sinuses/Orbits: No acute finding. Mild ethmoid air cell mucosal thickening. Other: None. CT CERVICAL SPINE FINDINGS Alignment: Slight reversal of the normal cervical lordosis. No traumatic malalignment. Skull base and vertebrae: No acute fracture. No primary bone lesion or focal pathologic process. Soft tissues and spinal canal: No prevertebral fluid or swelling. Unchanged medial deviation of the bilateral internal carotid arteries. No visible canal hematoma. Disc levels: Mild disc height loss at C3-C4. Moderate disc height loss and mild uncovertebral hypertrophy from C4-C5 through C6-C7. Upper chest: Centrilobular emphysema. Unchanged nodular architectural distortion  in the right upper lobe. Other: None. IMPRESSION: 1. No acute intracranial abnormality. Unchanged small lacunar infarct in the right centrum semiovale. 2. No acute cervical spine fracture. Moderate cervical spondylosis. Electronically Signed   By: Titus Dubin M.D.   On: 11/29/2018 17:12   Dg Pelvis Portable  Result Date: 11/29/2018 CLINICAL DATA:  Fall. EXAM: PORTABLE PELVIS 1-2 VIEWS COMPARISON:  None. FINDINGS: There is no evidence of pelvic fracture or diastasis. No pelvic bone lesions are seen. IMPRESSION: Negative. Electronically Signed   By: Marijo Conception, M.D.   On: 11/29/2018 16:06   Dg Chest Port 1 View  Result Date: 11/29/2018 CLINICAL DATA:  Fall. EXAM: PORTABLE CHEST 1 VIEW COMPARISON:  Radiograph of May 08, 2018. FINDINGS: Stable cardiomegaly. Left-sided Port-Kristyn Obyrne-Cath is unchanged in position. No pneumothorax is noted. Left lung is clear. Stable right basilar opacity is noted concerning for atelectasis or scarring with associated effusion. Bony thorax is unremarkable. IMPRESSION: Stable right basilar opacity is noted concerning for atelectasis or scarring with associated effusion. Electronically Signed   By: Marijo Conception, M.D.   On: 11/29/2018 16:09   Dg Ankle Left Port  Result Date: 11/29/2018 CLINICAL DATA:  Post reduction imaging, left ankle fractures. EXAM: PORTABLE LEFT ANKLE - 2 VIEW COMPARISON:  Pre reduction images obtained on 11/29/2018 at 1526 hours FINDINGS: Since the earlier study, there is been significant interval reduction. Talus has been mostly reduced with only 3-4 mm of lateral subluxation persisting. The distal fibula and medial malleolar fracture components have likewise been reduced with only mild residual displacement, medial malleolus of 3-4 mm. Ankle is encased in Casidee Jann plaster splint or cast. IMPRESSION: 1. Significant reduction of the trimalleolar fracture following closed reduction. Mild residual displacement and lateral talar subluxation of 3-4 mm persists.  Electronically Signed   By: Lajean Manes M.D.   On: 11/29/2018 19:47   Dg Foot Complete Left  Result Date: 11/29/2018 CLINICAL DATA:  Left foot pain after fall. EXAM: LEFT FOOT - COMPLETE 3+ VIEW COMPARISON:  None. FINDINGS: Lateral and posterior dislocation of talus relative to distal tibia is noted. Severely displaced medial malleolar fracture is noted. Moderately displaced distal fibular fracture is noted. No other bony abnormality is noted. IMPRESSION: Distal tibial and fibular fractures are noted with lateral and posterior dislocation of talus relative to distal tibia. Electronically Signed   By: Marijo Conception, M.D.   On: 11/29/2018 16:12        Scheduled Meds: . acetaminophen  650 mg Oral Q6H  . amiodarone  200 mg Oral Daily  . apixaban  5 mg Oral BID  . calcitRIOL  0.5 mcg Oral Daily  . carvedilol  25 mg Oral BID WC  . hydrALAZINE  25 mg Oral BID  . Melatonin  9 mg Oral  QHS  . polyethylene glycol  17 g Oral Daily  . umeclidinium-vilanterol  1 puff Inhalation Daily   Continuous Infusions: . lactated ringers 75 mL/hr at 11/30/18 1226     LOS: 0 days    Time spent: over 30 min    Fayrene Helper, MD [facility] Pager [phone]   If 7PM-7AM, please contact night-coverage [email or web address] Password [email or web address] 11/30/2018, 5:09 PM

## 2018-11-30 NOTE — Evaluation (Addendum)
Physical Therapy Evaluation Patient Details Name: Diane [name] MRN: [id number] DOB: November 26, 1941 Today's Date: 11/30/2018   History of Present Illness  Pt is a 78 y.o. female with medical history significant for COPD on 2 L of nasal cannula oxygen with activity, diastolic CHF, A. fib on Eliquis, CAD, MVP, HTN, HLD, and lung cancer s/p chemo.  She presented to the ED following a fall at home. Xray revealed L ankle fx. Surgical repair is tentatively scheduled for Friday, 12/06/18, to allow for soft tissue rest and swelling resolution.     Clinical Impression  Pt admitted with above diagnosis. Pt currently with functional limitations due to the deficits listed below (see PT Problem List). On eval, pt required min assist bed mobility. Pt declining transfers due to pain and fatigue. She states "I'm just not ready for that yet."  Transfer and gait ability will be significantly impacted by NWB status LLE. Pt will benefit from skilled PT to increase their independence and safety with mobility to allow discharge to the venue listed below.       Follow Up Recommendations SNF    Equipment Recommendations  Wheelchair (measurements PT)    Recommendations for Other Services       Precautions / Restrictions Precautions Precautions: Fall Restrictions Weight Bearing Restrictions: Yes LLE Weight Bearing: Non weight bearing      Mobility  Bed Mobility Overal bed mobility: Needs Assistance Bed Mobility: Rolling Rolling: Min assist   Supine to sit: Min assist;HOB elevated Sit to supine: Min assist;HOB elevated   General bed mobility comments: +rail, cues for sequencing, increased time and effort  Transfers                 General transfer comment: unable due to pain and fatigue  Ambulation/Gait             General Gait Details: unable due to NWB LLE  Stairs            Wheelchair Mobility    Modified Rankin (Stroke Patients Only)       Balance Overall balance  assessment: Needs assistance Sitting-balance support: No upper extremity supported;Feet unsupported Sitting balance-Leahy Scale: Good Sitting balance - Comments: Pt sat EOB x 5 minutes supervision                                     Pertinent Vitals/Pain Pain Assessment: Faces Faces Pain Scale: Hurts even more Pain Location: LLE with mobility Pain Descriptors / Indicators: Sore Pain Intervention(s): Limited activity within patient's tolerance;Monitored during session;Repositioned    Home Living Family/patient expects to be discharged to:: Private residence Living Arrangements: Spouse/significant other Available Help at Discharge: Family;Available 24 hours/day Type of Home: House Home Access: Stairs to enter Entrance Stairs-Rails: None Entrance Stairs-Number of Steps: 2 Home Layout: One level Home Equipment: Walker - 2 wheels;Bedside commode      Prior Function Level of Independence: Independent         Comments: no AD for mobility, Drives     Hand Dominance   Dominant Hand: Right    Extremity/Trunk Assessment   Upper Extremity Assessment Upper Extremity Assessment: Overall WFL for tasks assessed    Lower Extremity Assessment Lower Extremity Assessment: LLE deficits/detail LLE Deficits / Details: short leg splint       Communication   Communication: No difficulties  Cognition Arousal/Alertness: Awake/alert Behavior During Therapy: WFL for tasks assessed/performed Overall Cognitive Status: Within  Functional Limits for tasks assessed                                        General Comments      Exercises     Assessment/Plan    PT Assessment Patient needs continued PT services  PT Problem List Decreased activity tolerance;Obesity;Cardiopulmonary status limiting activity;Decreased mobility;Decreased strength;Decreased balance;Pain;Decreased knowledge of precautions       PT Treatment Interventions DME  instruction;Functional mobility training;Balance training;Patient/family education;Therapeutic activities;Gait training;Therapeutic exercise    PT Goals (Current goals can be found in the Care Plan section)  Acute Rehab PT Goals Patient Stated Goal: home when able to walk PT Goal Formulation: With patient Time For Goal Achievement: 12/14/18 Potential to Achieve Goals: Good    Frequency Min 3X/week   Barriers to discharge        Co-evaluation               AM-PAC PT "6 Clicks" Mobility  Outcome Measure Help needed turning from your back to your side while in a flat bed without using bedrails?: A Little Help needed moving from lying on your back to sitting on the side of a flat bed without using bedrails?: A Little Help needed moving to and from a bed to a chair (including a wheelchair)?: A Lot Help needed standing up from a chair using your arms (e.g., wheelchair or bedside chair)?: A Lot Help needed to walk in hospital room?: Total Help needed climbing 3-5 steps with a railing? : Total 6 Click Score: 12    End of Session   Activity Tolerance: Patient limited by fatigue;Patient limited by pain Patient left: in bed;with call bell/phone within reach Nurse Communication: Mobility status PT Visit Diagnosis: Other abnormalities of gait and mobility (R26.89);Pain Pain - Right/Left: Left Pain - part of body: Ankle and joints of foot    Time: 6644-0347 PT Time Calculation (min) (ACUTE ONLY): 16 min   Charges:   PT Evaluation $PT Eval Moderate Complexity: 1 Mod          Lorrin Goodell, PT  Office # [phone] Pager [phone]   Lorriane Shire 11/30/2018, 2:34 PM

## 2018-11-30 NOTE — Social Work (Signed)
CSW acknowledging consult for SNF placement. Will follow for therapy recommendations.   Laurina Fischl, MSW, LCSWA [facility] [phone]   

## 2018-11-30 NOTE — Plan of Care (Signed)
  Problem: Education: Goal: Knowledge of General Education information will improve Description Including pain rating scale, medication(s)/side effects and non-pharmacologic comfort measures Outcome: Progressing   Problem: Health Behavior/Discharge Planning: Goal: Ability to manage health-related needs will improve Outcome: Progressing   Problem: Clinical Measurements: Goal: Will remain free from infection Outcome: Progressing   Problem: Clinical Measurements: Goal: Cardiovascular complication will be avoided Outcome: Progressing   Problem: Pain Managment: Goal: General experience of comfort will improve Outcome: Progressing   Problem: Safety: Goal: Ability to remain free from injury will improve Outcome: Progressing

## 2018-11-30 NOTE — Consult Note (Signed)
ORTHOPAEDIC CONSULTATION  REQUESTING PHYSICIAN: Elodia Florence., *  PCP:  [name], MD  Chief Complaint: Left ankle pain  HPI: Diane [name] is a 78 y.o. female who complains of left ankle pain and deformity following a ground-level fall at her house prior to arrival in our emergency department.  She does have a somewhat complicated past medical history with history of lung cancer as well as stage III chronic kidney disease and being on chronic anticoagulation for diastolic heart failure and atrial fibrillation.  She does live independently with her husband who she states is not able to provide much help for her.  She denies loss of consciousness following the fall and denies other injuries.  She denies numbness or tingling in the left leg.  Past Medical History:  Diagnosis Date  . Anemia   . Aortic atherosclerosis ([place]) 12/03/2017   Noted on CT scan  . CAD (coronary artery disease), native coronary artery 12/03/2017   Noted on CT scan by calcification   . CHF (congestive heart failure) ([place])   . Chronic diastolic heart failure ([place]) 12/03/2017  . Dyspnea   . GERD (gastroesophageal reflux disease)   . Heart murmur   . History of blood transfusion    "related to chemo" (12/05/2017)  . Hypertension   . Hypertensive heart disease 12/03/2017  . Kidney disease, chronic, stage III (GFR 30-59 ml/min) ([place]) 12/03/2017  . Lung cancer ([place]) dx'd 02/2010  . Mitral valve prolapse   . Morbid obesity ([place]) 12/03/2017  . On home oxygen therapy   . 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 Paroxysmal atrial fibrillation ([facility])   . Persistent atrial fibrillation 12/03/2017  . Restrictive lung disease 05/23/2017   Past Surgical History:  Procedure Laterality Date  . BIOPSY  07/16/2018   Procedure: BIOPSY;  Surgeon: Mauri Pole, MD;  Location: [facility];  Service: Endoscopy;;  . CARDIOVERSION N/A 12/06/2017   Procedure: CARDIOVERSION;  Surgeon: Jacolyn Reedy, MD;   Location: [facility]COPY;  Service: Cardiovascular;  Laterality: N/A;  . CATARACT EXTRACTION W/ INTRAOCULAR LENS  IMPLANT, BILATERAL Bilateral   . COLONOSCOPY WITH PROPOFOL N/A 07/16/2018   Procedure: COLONOSCOPY WITH PROPOFOL;  Surgeon: Mauri Pole, MD;  Location: [facility] ENDOSCOPY;  Service: Endoscopy;  Laterality: N/A;  . DERMABRASION  ~ 1966   "face; for acne"  . ESOPHAGOGASTRODUODENOSCOPY (EGD) WITH PROPOFOL N/A 07/16/2018   Procedure: ESOPHAGOGASTRODUODENOSCOPY (EGD) WITH PROPOFOL;  Surgeon: Mauri Pole, MD;  Location: [facility] ENDOSCOPY;  Service: Endoscopy;  Laterality: N/A;  . PAROTID GLAND TUMOR EXCISION Left   . POLYPECTOMY  07/16/2018   Procedure: POLYPECTOMY;  Surgeon: Mauri Pole, MD;  Location: [facility] ENDOSCOPY;  Service: Endoscopy;;  . PORTACATH PLACEMENT  11/03/08-[place]   tip in mid SVC-E. [place]  . THORACENTESIS Right   . TONSILLECTOMY    . VIDEO BRONCHOSCOPY WITH ENDOBRONCHIAL ULTRASOUND Right 05/08/2018   Procedure: VIDEO BRONCHOSCOPY WITH ENDOBRONCHIAL ULTRASOUND;  Surgeon: Collene Gobble, MD;  Location: [facility] OR;  Service: Thoracic;  Laterality: Right;   Social History   Socioeconomic History  . Marital status: Married    Spouse name: Not on file  . Number of children: Not on file  . Years of education: Not on file  . Highest education level: Not on file  Occupational History  . Not on file  Social Needs  . Financial resource strain: Not hard at all  . Food insecurity:    Worry: Patient refused  Inability: Patient refused  . Transportation needs:    Medical: Patient refused    Non-medical: Patient refused  Tobacco Use  . Smoking status: Former Smoker    Packs/day: 2.00    Years: 40.00    Pack years: 80.00    Types: Cigarettes    Last attempt to quit: 11/27/2001    Years since quitting: 17.0  . Smokeless tobacco: Never Used  Substance and Sexual Activity  . Alcohol use: Yes    Comment: 12/05/2017 "once in a great while I might have a drink"  .  Drug use: No  . Sexual activity: Not Currently  Lifestyle  . Physical activity:    Days per week: 1 day    Minutes per session: 30 min  . Stress: Only a little  Relationships  . Social connections:    Talks on phone: Patient refused    Gets together: Patient refused    Attends religious service: Patient refused    Active member of club or organization: Patient refused    Attends meetings of clubs or organizations: Patient refused    Relationship status: Patient refused  Other Topics Concern  . Not on file  Social History Narrative   [facility] (02/19/17):   Patient is originally from [place]. She has also lived in [place]. She has worked primarily Veterinary surgeon. Has a dog currently. No bird or mold exposure.    Family History  Problem Relation Age of Onset  . Breast cancer Mother   . Heart attack Mother   . Diabetes Father   . Heart attack Father   . Diabetes Brother   . COPD Cousin   . Colon cancer Neg Hx    Allergies  Allergen Reactions  . Septra Ds [Sulfamethoxazole-Trimethoprim] Nausea Only   Prior to Admission medications   Medication Sig Start Date End Date Taking? Authorizing Provider  acetaminophen (TYLENOL) 500 MG tablet Take 1,000 mg by mouth at bedtime.   Yes [provider]  albuterol (PROAIR HFA) 108 (90 Base) MCG/ACT inhaler Inhale 2 puffs into the lungs every 4 (four) hours as needed for wheezing or shortness of breath. 07/08/18  Yes Collene Gobble, MD  amiodarone (PACERONE) 200 MG tablet Take 1 tablet (200 mg total) by mouth daily. 10/02/18  Yes Leonie Man, MD  ANORO ELLIPTA 62.5-25 MCG/INH AEPB INHALE 1 PUFF BY MOUTH INTO LUNGS ONCE DAILY Patient taking differently: Inhale 1 puff into the lungs daily.  09/19/18  Yes Collene Gobble, MD  apixaban (ELIQUIS) 5 MG TABS tablet Take 1 tablet (5 mg total) by mouth 2 (two) times daily. OK to restart on Friday 05/10/18. 10/02/18  Yes Leonie Man, MD  calcitRIOL (ROCALTROL) 0.5 MCG capsule Take 0.5 mcg by  mouth daily.  09/27/18  Yes [provider]  carvedilol (COREG) 25 MG tablet Take 1 tablet (25 mg total) by mouth 2 (two) times daily with a meal. 10/02/18  Yes Leonie Man, MD  cyanocobalamin (,VITAMIN B-12,) 1000 MCG/ML injection Inject 1,000 mcg into the muscle every 30 (thirty) days.  09/18/18  Yes [provider]  hydrALAZINE (APRESOLINE) 25 MG tablet Take 1 tablet (25 mg total) by mouth 2 (two) times daily. 10/02/18  Yes Leonie Man, MD  hydroxypropyl methylcellulose / hypromellose (ISOPTO TEARS / GONIOVISC) 2.5 % ophthalmic solution Place 1 drop into both eyes 2 (two) times daily as needed for dry eyes.    Yes [provider]  Melatonin 10 MG TABS Take 10 mg by  mouth daily.   Yes [provider]  polyethylene glycol (MIRALAX / GLYCOLAX) packet Take 17 g by mouth at bedtime.    Yes [provider]  torsemide (DEMADEX) 20 MG tablet Take 2 tablets (40 mg total) by mouth 2 (two) times daily with a meal. 07/31/18  Yes Leonie Man, MD   Dg Tibia/fibula Left  Result Date: 11/29/2018 CLINICAL DATA:  Left leg pain after fall. EXAM: LEFT TIBIA AND FIBULA - 2 VIEW COMPARISON:  None. FINDINGS: Severely displaced medial malleolar fracture is noted. Moderately displaced oblique fracture is seen involving distal fibula. Lateral and posterior dislocation of talus relative to tibia is noted. IMPRESSION: Lateral and posterior dislocation of talus relative to distal tibia is noted with associated severely displaced medial malleolar fracture and moderately displaced distal fibular fracture Electronically Signed   By: Marijo Conception, M.D.   On: 11/29/2018 16:11   Dg Ankle Complete Left  Result Date: 11/29/2018 CLINICAL DATA:  Left ankle pain after fall. EXAM: LEFT ANKLE COMPLETE - 3+ VIEW COMPARISON:  None. FINDINGS: Lateral posterior dislocation of left talus relative to distal left tibia is noted. Severely displaced medial malleolar fracture is noted.  Moderately displaced and comminuted fracture is seen involving the distal left fibula. Also noted is moderately displaced posterior malleolar fracture. IMPRESSION: Trimalleolar fracture is noted with associated lateral and posterior dislocation of talus relative to distal tibia. Electronically Signed   By: Marijo Conception, M.D.   On: 11/29/2018 16:13   Ct Head Wo Contrast  Result Date: 11/29/2018 CLINICAL DATA:  Fall.  On Eliquis.  History of lung cancer. EXAM: CT HEAD WITHOUT CONTRAST CT CERVICAL SPINE WITHOUT CONTRAST TECHNIQUE: Multidetector CT imaging of the head and cervical spine was performed following the standard protocol without intravenous contrast. Multiplanar CT image reconstructions of the cervical spine were also generated. COMPARISON:  CT chest dated August 19, 2018. MR brain dated Apr 12, 2015. CT head dated March 16, 2011. CT neck dated August 12, 2009. FINDINGS: CT HEAD FINDINGS Brain: No evidence of acute infarction, hemorrhage, hydrocephalus, extra-axial collection or mass lesion/mass effect. Stable small lacunar infarct in the right centrum semiovale. Stable mild atrophy. Vascular: Atherosclerotic vascular calcification of the carotid siphons. No hyperdense vessel. Skull: Normal. Negative for fracture or focal lesion. Sinuses/Orbits: No acute finding. Mild ethmoid air cell mucosal thickening. Other: None. CT CERVICAL SPINE FINDINGS Alignment: Slight reversal of the normal cervical lordosis. No traumatic malalignment. Skull base and vertebrae: No acute fracture. No primary bone lesion or focal pathologic process. Soft tissues and spinal canal: No prevertebral fluid or swelling. Unchanged medial deviation of the bilateral internal carotid arteries. No visible canal hematoma. Disc levels: Mild disc height loss at C3-C4. Moderate disc height loss and mild uncovertebral hypertrophy from C4-C5 through C6-C7. Upper chest: Centrilobular emphysema. Unchanged nodular architectural distortion in  the right upper lobe. Other: None. IMPRESSION: 1. No acute intracranial abnormality. Unchanged small lacunar infarct in the right centrum semiovale. 2. No acute cervical spine fracture. Moderate cervical spondylosis. Electronically Signed   By: Titus Dubin M.D.   On: 11/29/2018 17:12   Ct Cervical Spine Wo Contrast  Result Date: 11/29/2018 CLINICAL DATA:  Fall.  On Eliquis.  History of lung cancer. EXAM: CT HEAD WITHOUT CONTRAST CT CERVICAL SPINE WITHOUT CONTRAST TECHNIQUE: Multidetector CT imaging of the head and cervical spine was performed following the standard protocol without intravenous contrast. Multiplanar CT image reconstructions of the cervical spine were also generated. COMPARISON:  CT chest dated  August 19, 2018. MR brain dated Apr 12, 2015. CT head dated March 16, 2011. CT neck dated August 12, 2009. FINDINGS: CT HEAD FINDINGS Brain: No evidence of acute infarction, hemorrhage, hydrocephalus, extra-axial collection or mass lesion/mass effect. Stable small lacunar infarct in the right centrum semiovale. Stable mild atrophy. Vascular: Atherosclerotic vascular calcification of the carotid siphons. No hyperdense vessel. Skull: Normal. Negative for fracture or focal lesion. Sinuses/Orbits: No acute finding. Mild ethmoid air cell mucosal thickening. Other: None. CT CERVICAL SPINE FINDINGS Alignment: Slight reversal of the normal cervical lordosis. No traumatic malalignment. Skull base and vertebrae: No acute fracture. No primary bone lesion or focal pathologic process. Soft tissues and spinal canal: No prevertebral fluid or swelling. Unchanged medial deviation of the bilateral internal carotid arteries. No visible canal hematoma. Disc levels: Mild disc height loss at C3-C4. Moderate disc height loss and mild uncovertebral hypertrophy from C4-C5 through C6-C7. Upper chest: Centrilobular emphysema. Unchanged nodular architectural distortion in the right upper lobe. Other: None. IMPRESSION: 1. No  acute intracranial abnormality. Unchanged small lacunar infarct in the right centrum semiovale. 2. No acute cervical spine fracture. Moderate cervical spondylosis. Electronically Signed   By: Titus Dubin M.D.   On: 11/29/2018 17:12   Dg Pelvis Portable  Result Date: 11/29/2018 CLINICAL DATA:  Fall. EXAM: PORTABLE PELVIS 1-2 VIEWS COMPARISON:  None. FINDINGS: There is no evidence of pelvic fracture or diastasis. No pelvic bone lesions are seen. IMPRESSION: Negative. Electronically Signed   By: Marijo Conception, M.D.   On: 11/29/2018 16:06   Dg Chest Port 1 View  Result Date: 11/29/2018 CLINICAL DATA:  Fall. EXAM: PORTABLE CHEST 1 VIEW COMPARISON:  Radiograph of May 08, 2018. FINDINGS: Stable cardiomegaly. Left-sided Port-A-Cath is unchanged in position. No pneumothorax is noted. Left lung is clear. Stable right basilar opacity is noted concerning for atelectasis or scarring with associated effusion. Bony thorax is unremarkable. IMPRESSION: Stable right basilar opacity is noted concerning for atelectasis or scarring with associated effusion. Electronically Signed   By: Marijo Conception, M.D.   On: 11/29/2018 16:09   Dg Ankle Left Port  Result Date: 11/29/2018 CLINICAL DATA:  Post reduction imaging, left ankle fractures. EXAM: PORTABLE LEFT ANKLE - 2 VIEW COMPARISON:  Pre reduction images obtained on 11/29/2018 at 1526 hours FINDINGS: Since the earlier study, there is been significant interval reduction. Talus has been mostly reduced with only 3-4 mm of lateral subluxation persisting. The distal fibula and medial malleolar fracture components have likewise been reduced with only mild residual displacement, medial malleolus of 3-4 mm. Ankle is encased in a plaster splint or cast. IMPRESSION: 1. Significant reduction of the trimalleolar fracture following closed reduction. Mild residual displacement and lateral talar subluxation of 3-4 mm persists. Electronically Signed   By: Lajean Manes M.D.   On:  11/29/2018 19:47   Dg Foot Complete Left  Result Date: 11/29/2018 CLINICAL DATA:  Left foot pain after fall. EXAM: LEFT FOOT - COMPLETE 3+ VIEW COMPARISON:  None. FINDINGS: Lateral and posterior dislocation of talus relative to distal tibia is noted. Severely displaced medial malleolar fracture is noted. Moderately displaced distal fibular fracture is noted. No other bony abnormality is noted. IMPRESSION: Distal tibial and fibular fractures are noted with lateral and posterior dislocation of talus relative to distal tibia. Electronically Signed   By: Marijo Conception, M.D.   On: 11/29/2018 16:12    Positive ROS: All other systems have been reviewed and were otherwise negative with the exception of those mentioned  in the HPI and as above.  Physical Exam: General: Alert, no acute distress Cardiovascular: No pedal edema Respiratory: No cyanosis, no use of accessory musculature, nasal cannula in place GI: No organomegaly, abdomen is soft and non-tender Skin: No lesions in the area of chief complaint Neurologic: Sensation intact distally Psychiatric: Patient is competent for consent with normal mood and affect Lymphatic: No axillary or cervical lymphadenopathy  MUSCULOSKELETAL:   Short leg splint applied to the left ankle.  This is in good repair.  Her toes are warm and well-perfused with capillary refill less than 2 seconds.  She is able to wiggle her toes voluntarily.  Sensations intact in the deep and superficial peroneal nerve.  No pain with passive stretch. Assessment: Closed left trimalleolar ankle fracture status post close reduction in the ED by EDP.  Plan: -Ms. Monette and I discussed that her ankle fracture unfortunately will require surgical fixation.  This is somewhat complicated by the fact that she is on chronic Eliquis and also that she required a closed reduction in the ED which both increased soft tissue swelling.  Due to the swelling we would not move forward with definitive  fixation for at least 7 days for up to 14 days.  In the interim between now and surgery I think it is reasonable to begin physical therapy as she will be nonweightbearing on the left leg following surgery for 6 weeks and her weightbearing status will not change between now and surgery. -We could tentatively plan for surgery this upcoming Friday which will give [name] a week for soft tissue rest and swelling resolution.  She will also clearly need to be off of her Eliquis for at least 48 hours prior to surgical intervention.  I will defer to the hospitalist for their recommendations regarding anticoagulation. -Certainly if it reasonable she could be discharged home and come back for surgery or stay in the hospital due to her issues with help at home until surgical fixation and then likely need a SNF following discharge. -Nonweightbearing to the left lower extremity.  Maintain strict elevation.    Nicholes Stairs, MD Cell [phone]    11/30/2018 8:13 AM

## 2018-11-30 NOTE — Progress Notes (Signed)
PT Cancellation Note  Patient Details Name: Diane [name] MRN: [id number] DOB: [birth year]   Cancelled Treatment:    Reason Eval/Treat Not Completed: Active bedrest order. Please update activity order, when appropriate, for PT to proceed with eval.    Diane [name] 11/30/2018, 8:13 AM  Lorrin Goodell, PT  Office # [phone] Pager [phone]

## 2018-12-01 ENCOUNTER — Observation Stay (HOSPITAL_COMMUNITY): Payer: Medicare Other

## 2018-12-01 ENCOUNTER — Other Ambulatory Visit (HOSPITAL_COMMUNITY): Payer: Medicare Other

## 2018-12-01 DIAGNOSIS — S82852A Displaced trimalleolar fracture of left lower leg, initial encounter for closed fracture: Secondary | ICD-10-CM | POA: Diagnosis not present

## 2018-12-01 LAB — VITAMIN B12: Vitamin B-12: 2039 pg/mL — ABNORMAL HIGH (ref 180–914)

## 2018-12-01 LAB — BASIC METABOLIC PANEL
Anion gap: 8 (ref 5–15)
BUN: 40 mg/dL — ABNORMAL HIGH (ref 8–23)
CO2: 27 mmol/L (ref 22–32)
CREATININE: 2.85 mg/dL — AB (ref 0.44–1.00)
Calcium: 9.2 mg/dL (ref 8.9–10.3)
Chloride: 100 mmol/L (ref 98–111)
GFR calc Af Amer: 18 mL/min — ABNORMAL LOW (ref >60)
GFR calc non Af Amer: 15 mL/min — ABNORMAL LOW (ref >60)
Glucose, Bld: 107 mg/dL — ABNORMAL HIGH (ref 70–99)
Potassium: 3.2 mmol/L — ABNORMAL LOW (ref 3.5–5.1)
Sodium: 135 mmol/L (ref 135–145)

## 2018-12-01 LAB — HEMOGLOBIN AND HEMATOCRIT, BLOOD
HCT: 26.2 % — ABNORMAL LOW (ref 36.0–46.0)
Hemoglobin: 8.2 g/dL — ABNORMAL LOW (ref 12.0–15.0)

## 2018-12-01 LAB — CBC
HCT: 26.2 % — ABNORMAL LOW (ref 36.0–46.0)
Hemoglobin: 8.1 g/dL — ABNORMAL LOW (ref 12.0–15.0)
MCH: 30.3 pg (ref 26.0–34.0)
MCHC: 30.9 g/dL (ref 30.0–36.0)
MCV: 98.1 fL (ref 80.0–100.0)
Platelets: 128 10*3/uL — ABNORMAL LOW (ref 150–400)
RBC: 2.67 MIL/uL — ABNORMAL LOW (ref 3.87–5.11)
RDW: 14 % (ref 11.5–15.5)
WBC: 5.5 10*3/uL (ref 4.0–10.5)
nRBC: 0 % (ref 0.0–0.2)

## 2018-12-01 LAB — IRON AND TIBC
Iron: 16 ug/dL — ABNORMAL LOW (ref 28–170)
Saturation Ratios: 6 % — ABNORMAL LOW (ref 10.4–31.8)
TIBC: 256 ug/dL (ref 250–450)
UIBC: 240 ug/dL

## 2018-12-01 LAB — FOLATE: Folate: 19.4 ng/mL (ref >5.9)

## 2018-12-01 LAB — MAGNESIUM: Magnesium: 2.3 mg/dL (ref 1.7–2.4)

## 2018-12-01 LAB — FERRITIN: Ferritin: 102 ng/mL (ref 11–307)

## 2018-12-01 MED ORDER — LACTATED RINGERS IV SOLN
INTRAVENOUS | Status: DC
  Administered 2018-12-01 – 2018-12-03 (×4): via INTRAVENOUS

## 2018-12-01 MED ORDER — SODIUM CHLORIDE 0.9% FLUSH
10.0000 mL | INTRAVENOUS | Status: DC | PRN
  Administered 2018-12-04: 10 mL
  Administered 2018-12-06: 20 mL
  Administered 2018-12-11 (×2): 10 mL
  Filled 2018-12-01 (×4): qty 40

## 2018-12-01 MED ORDER — POTASSIUM CHLORIDE CRYS ER 20 MEQ PO TBCR
40.0000 meq | EXTENDED_RELEASE_TABLET | Freq: Once | ORAL | Status: AC
  Administered 2018-12-01: 40 meq via ORAL
  Filled 2018-12-01: qty 2

## 2018-12-01 NOTE — Plan of Care (Signed)
  Problem: Education: Goal: Knowledge of General Education information will improve Description Including pain rating scale, medication(s)/side effects and non-pharmacologic comfort measures Outcome: Progressing   Problem: Clinical Measurements: Goal: Ability to maintain clinical measurements within normal limits will improve Outcome: Progressing   Problem: Clinical Measurements: Goal: Will remain free from infection Outcome: Progressing   Problem: Nutrition: Goal: Adequate nutrition will be maintained Outcome: Progressing   Problem: Safety: Goal: Ability to remain free from injury will improve Outcome: Progressing

## 2018-12-01 NOTE — Progress Notes (Signed)
PROGRESS NOTE    Diane [name]  MGQ:[id number] DOB: May 21, 1941 DOA: 11/29/2018 PCP: [name], MD   Brief Narrative:  Per HPI Diane [name] is a 78 y.o. female with medical history significant of COPD on 2 L of nasal cannula oxygen with activity, diastolic CHF, A. fib on Eliquis, CAD, MVP, HTN, HLD, and lung cancer s/p chemo followed by [name]; who presents after having a fall at home.  Patient reports that she had just gotten back from the beauty shop and was coming up the steps.  She put her hand on the windowsill as she normally does to help get up the steps, but her hand slipped.  Patient fell backwards hitting her head on the concrete and twisting her left ankle.  She did not lose consciousness, but reported excruciating pain in swelling of the left ankle.  Her husband with whom she lives was unable to help her up and called EMS.  Patient reports that otherwise she has no complaints.     Assessment & Plan:   Principal Problem:   Trimalleolar fracture of ankle, closed, left, initial encounter Active Problems:   Cancer of right lung parenchyma (HCC)   Chronic diastolic heart failure (HCC)   Persistent atrial fibrillation; CHA2DS2Vacs = 6; Eliquis; Amiodarone for rhythm control   Chronic anticoagulation   Morbid obesity (HCC)   COPD (chronic obstructive pulmonary disease) ([place])   Fall at home, initial encounter   Fall with left ankle fracture:  Acute.  S/p mechanical fall at home onto the concrete.  X-rays reveal trimalleolar, distal tibia and fibular fractures.   Patient underwent closed reduction in the ED Orthopedics Dr. [name] consulted but recommended outpatient follow-up initially, but pt admitted as she was unable to ambulate.  PT recommending SNF.  Will pursue this after surgery on Friday, 1/10. - Appreciate Dr. [name] consultation, due to swelling, holding off on definitive fixation, surgery planned tentatively for Friday 1/10.  Eliquis will need to be held  48 hours prior to surgical intervention (currently on hold, see below). - oxycodone prn, tylenol scheduled, bowel regimen - Physical therapy to eval and treat - Social work and care management consults - Appreciate orthopedic consultative services, will follow-up for further recommendation  Acute kidney injury superimposed on chronic kidney disease stage IV: Patient presented with a creatinine of 2.4 with BUN 34.  Baseline creatinine previously noted to be around 2. - Creatinine worsened to peak at 3.19, improved today to 2.85 - of note, pt did get dose of torsemide on 1/3 (unclear reason) - pt does not appear volume overloaded, will continue IVF - Follow renal ultrasound and CXR - Follow urinalysis ([place]) - Hold nephrotoxic agents - Continue to monitor kidney function - will hold eliquis with worsening renal function, plan to resume when consistently improving or stable  Anemia: Hb has dropped to ~8 since presentation.  No si/sx bleeding.  All counts have dropped and pt was getting IVF, possible this is hemodilution.  Hemodynamically stable. Follow iron, b12, folate Continue to monitor Hold eliquis for now  Thrombocytopenia: mild, continue to monitor, w/u further as indicated  Persistent atrial fibrillation on chronic anticoagulation:CHA2DS2-VASc score = 6 - Continue amiodarone - holding eliquis in setting of AKI and downtrending H/H - if H/H stable tomorrow can discuss resumption of eliquis until 48 hrs prior to surgery vs heparin/lovenox bridge  Diastolic HF: Chronic.  Patient appears to be euvolemic at this time.  Last EF noted to be 65 to 70% by  echocardiogram on 12/12/2017. - Strict I&O's and daily weights  COPD, without acute exacerbation: Patient reports being on oxygen of 2 L with activity.  O2 saturations maintained currently. - Continuous pulse oximetry with nasal cannula oxygen - Continue Anoro Ellipta - DuoNeb's as needed for shortness of  breath/wheezing  Adenocarcinoma of the lung: Patient followed in the outpatient setting by [name].  Currently not receiving treatment - Continue outpatient follow-up   Essential hypertension - Continue hydralazine and Coreg  Hypokalemia: replace, follow  Morbid obesity, BMI 41.6   DVT prophylaxis: SCD's Code Status: full  Family Communication: none at bedside Disposition Plan: pending surgery.  Pt unsafe for discharge.  She'll require SNF placement at discharge and is currently awaiting surgery on 1/10.  Continues to require inpatient management.    Consultants:   Orthopedic surgery  Procedures:   Closed reduction of fracture 1/3  Antimicrobials: Anti-infectives (From admission, onward)   None     Subjective: Pain ok if she doesn't move leg too much.  Objective: Vitals:   11/30/18 2030 12/01/18 0538 12/01/18 0725 12/01/18 1049  BP: (!) 129/47 (!) 139/49  (!) 148/56  Pulse: 79 81  87  Resp: 16 18    Temp: 98.2 F (36.8 C) 98.5 F (36.9 C)    TempSrc: Oral Oral    SpO2: 99% 98% 98%   Weight:      Height:        Intake/Output Summary (Last 24 hours) at 12/01/2018 1605 Last data filed at 12/01/2018 0800 Gross per 24 hour  Intake 1340 ml  Output 150 ml  Net 1190 ml   Filed Weights   11/29/18 1436  Weight: 113.4 kg    Examination:  General: No acute distress. Cardiovascular: Heart sounds show a regular rate, and rhythm Lungs: Clear to auscultation bilaterally  Abdomen: Soft, nontender, nondistended  Neurological: Alert and oriented 3. Moves all extremities 4. Cranial nerves II through XII grossly intact. Skin: Warm and dry. No rashes or lesions. Extremities: LLE in splint, trace edema Psychiatric: Mood and affect are normal. Insight and judgment are appropriate.   Data Reviewed: I have personally reviewed following labs and imaging studies  CBC: Recent Labs  Lab 11/29/18 1506 11/29/18 1618 11/30/18 0357 12/01/18 0332 12/01/18 1539   WBC 8.7  --  6.8 5.5  --   HGB 11.5* 11.9* 9.6* 8.1* 8.2*  HCT 36.0 35.0* 30.2* 26.2* 26.2*  MCV 98.1  --  97.4 98.1  --   PLT 190  --  173 128*  --    Basic Metabolic Panel: Recent Labs  Lab 11/29/18 1506 11/29/18 1618 11/30/18 0357 11/30/18 1815 12/01/18 0332  NA 139 138 137 132* 135  K 3.9 4.0 3.6 3.3* 3.2*  CL 100 99 101 98 100  CO2 31  --  27 26 27   GLUCOSE 116* 112* 103* 157* 107*  BUN 32* 34* 35* 42* 40*  CREATININE 2.38* 2.40* 2.63* 3.19* 2.85*  CALCIUM 9.4  --  8.9 8.8* 9.2  MG  --   --   --   --  2.3   GFR: Estimated Creatinine Clearance: 20.4 mL/min (A) (by C-G formula based on SCr of 2.85 mg/dL (H)). Liver Function Tests: Recent Labs  Lab 11/29/18 1506  AST 20  ALT 15  ALKPHOS 44  BILITOT 0.8  PROT 7.0  ALBUMIN 3.3*   No results for input(s): LIPASE, AMYLASE in the last 168 hours. No results for input(s): AMMONIA in the last 168 hours. Coagulation  Profile: Recent Labs  Lab 11/29/18 1506  INR 1.44   Cardiac Enzymes: No results for input(s): CKTOTAL, CKMB, CKMBINDEX, TROPONINI in the last 168 hours. BNP (last 3 results) No results for input(s): PROBNP in the last 8760 hours. HbA1C: No results for input(s): HGBA1C in the last 72 hours. CBG: No results for input(s): GLUCAP in the last 168 hours. Lipid Profile: No results for input(s): CHOL, HDL, LDLCALC, TRIG, CHOLHDL, LDLDIRECT in the last 72 hours. Thyroid Function Tests: No results for input(s): TSH, T4TOTAL, FREET4, T3FREE, THYROIDAB in the last 72 hours. Anemia Panel: No results for input(s): VITAMINB12, FOLATE, FERRITIN, TIBC, IRON, RETICCTPCT in the last 72 hours. Sepsis Labs: Recent Labs  Lab 11/29/18 1618  LATICACIDVEN 1.05    No results found for this or any previous visit (from the past 240 hour(s)).       Radiology Studies: Ct Head Wo Contrast  Result Date: 11/29/2018 CLINICAL DATA:  Fall.  On Eliquis.  History of lung cancer. EXAM: CT HEAD WITHOUT CONTRAST CT CERVICAL  SPINE WITHOUT CONTRAST TECHNIQUE: Multidetector CT imaging of the head and cervical spine was performed following the standard protocol without intravenous contrast. Multiplanar CT image reconstructions of the cervical spine were also generated. COMPARISON:  CT chest dated August 19, 2018. MR brain dated Apr 12, 2015. CT head dated March 16, 2011. CT neck dated August 12, 2009. FINDINGS: CT HEAD FINDINGS Brain: No evidence of acute infarction, hemorrhage, hydrocephalus, extra-axial collection or mass lesion/mass effect. Stable small lacunar infarct in the right centrum semiovale. Stable mild atrophy. Vascular: Atherosclerotic vascular calcification of the carotid siphons. No hyperdense vessel. Skull: Normal. Negative for fracture or focal lesion. Sinuses/Orbits: No acute finding. Mild ethmoid air cell mucosal thickening. Other: None. CT CERVICAL SPINE FINDINGS Alignment: Slight reversal of the normal cervical lordosis. No traumatic malalignment. Skull base and vertebrae: No acute fracture. No primary bone lesion or focal pathologic process. Soft tissues and spinal canal: No prevertebral fluid or swelling. Unchanged medial deviation of the bilateral internal carotid arteries. No visible canal hematoma. Disc levels: Mild disc height loss at C3-C4. Moderate disc height loss and mild uncovertebral hypertrophy from C4-C5 through C6-C7. Upper chest: Centrilobular emphysema. Unchanged nodular architectural distortion in the right upper lobe. Other: None. IMPRESSION: 1. No acute intracranial abnormality. Unchanged small lacunar infarct in the right centrum semiovale. 2. No acute cervical spine fracture. Moderate cervical spondylosis. Electronically Signed   By: Titus Dubin M.D.   On: 11/29/2018 17:12   Ct Cervical Spine Wo Contrast  Result Date: 11/29/2018 CLINICAL DATA:  Fall.  On Eliquis.  History of lung cancer. EXAM: CT HEAD WITHOUT CONTRAST CT CERVICAL SPINE WITHOUT CONTRAST TECHNIQUE: Multidetector CT  imaging of the head and cervical spine was performed following the standard protocol without intravenous contrast. Multiplanar CT image reconstructions of the cervical spine were also generated. COMPARISON:  CT chest dated August 19, 2018. MR brain dated Apr 12, 2015. CT head dated March 16, 2011. CT neck dated August 12, 2009. FINDINGS: CT HEAD FINDINGS Brain: No evidence of acute infarction, hemorrhage, hydrocephalus, extra-axial collection or mass lesion/mass effect. Stable small lacunar infarct in the right centrum semiovale. Stable mild atrophy. Vascular: Atherosclerotic vascular calcification of the carotid siphons. No hyperdense vessel. Skull: Normal. Negative for fracture or focal lesion. Sinuses/Orbits: No acute finding. Mild ethmoid air cell mucosal thickening. Other: None. CT CERVICAL SPINE FINDINGS Alignment: Slight reversal of the normal cervical lordosis. No traumatic malalignment. Skull base and vertebrae: No acute fracture. No primary bone  lesion or focal pathologic process. Soft tissues and spinal canal: No prevertebral fluid or swelling. Unchanged medial deviation of the bilateral internal carotid arteries. No visible canal hematoma. Disc levels: Mild disc height loss at C3-C4. Moderate disc height loss and mild uncovertebral hypertrophy from C4-C5 through C6-C7. Upper chest: Centrilobular emphysema. Unchanged nodular architectural distortion in the right upper lobe. Other: None. IMPRESSION: 1. No acute intracranial abnormality. Unchanged small lacunar infarct in the right centrum semiovale. 2. No acute cervical spine fracture. Moderate cervical spondylosis. Electronically Signed   By: Titus Dubin M.D.   On: 11/29/2018 17:12   Dg Ankle Left Port  Result Date: 11/29/2018 CLINICAL DATA:  Post reduction imaging, left ankle fractures. EXAM: PORTABLE LEFT ANKLE - 2 VIEW COMPARISON:  Pre reduction images obtained on 11/29/2018 at 1526 hours FINDINGS: Since the earlier study, there is been  significant interval reduction. Talus has been mostly reduced with only 3-4 mm of lateral subluxation persisting. The distal fibula and medial malleolar fracture components have likewise been reduced with only mild residual displacement, medial malleolus of 3-4 mm. Ankle is encased in a plaster splint or cast. IMPRESSION: 1. Significant reduction of the trimalleolar fracture following closed reduction. Mild residual displacement and lateral talar subluxation of 3-4 mm persists. Electronically Signed   By: Lajean Manes M.D.   On: 11/29/2018 19:47        Scheduled Meds: . acetaminophen  650 mg Oral Q6H  . amiodarone  200 mg Oral Daily  . calcitRIOL  0.5 mcg Oral Daily  . carvedilol  25 mg Oral BID WC  . hydrALAZINE  25 mg Oral BID  . Melatonin  9 mg Oral QHS  . polyethylene glycol  17 g Oral Daily  . potassium chloride  40 mEq Oral Once  . umeclidinium-vilanterol  1 puff Inhalation Daily   Continuous Infusions: . lactated ringers       LOS: 0 days    Time spent: over 30 min    Diane Helper, MD [facility] Pager [phone]   If 7PM-7AM, please contact night-coverage [email or web address] Password [email or web address] 12/01/2018, 4:05 PM

## 2018-12-01 NOTE — Progress Notes (Signed)
   Subjective:   Left ankle, trimalleolar fracture Patient reports pain as mild.   Patient seen in rounds with Dr. Wynelle Link. Patient is well, and has had no acute complaints or problems other than discomfort in the left ankle. No SOB or chest pain. No issues overnight. Denies numbness and tingling in the left LE.    Objective: Vital signs in last 24 hours: Temp:  [98.2 F (36.8 C)-98.6 F (37 C)] 98.5 F (36.9 C) (01/05 0538) Pulse Rate:  [79-81] 81 (01/05 0538) Resp:  [16-18] 18 (01/05 0538) BP: (118-139)/(47-52) 139/49 (01/05 0538) SpO2:  [98 %-99 %] 98 % (01/05 0725)  Intake/Output from previous day:  Intake/Output Summary (Last 24 hours) at 12/01/2018 0845 Last data filed at 12/01/2018 0400 Gross per 24 hour  Intake 1817 ml  Output 200 ml  Net 1617 ml     Labs: Recent Labs    11/29/18 1506 11/29/18 1618 11/30/18 0357 12/01/18 0332  HGB 11.5* 11.9* 9.6* 8.1*   Recent Labs    11/30/18 0357 12/01/18 0332  WBC 6.8 5.5  RBC 3.10* 2.67*  HCT 30.2* 26.2*  PLT 173 128*   Recent Labs    11/30/18 1815 12/01/18 0332  NA 132* 135  K 3.3* 3.2*  CL 98 100  CO2 26 27  BUN 42* 40*  CREATININE 3.19* 2.85*  GLUCOSE 157* 107*  CALCIUM 8.8* 9.2   Recent Labs    11/29/18 1506  INR 1.44    EXAM General - Patient is Alert and Oriented Extremity - Neurologically intact Sensation intact distally Compartment soft Motor Function - intact, moving toes well on exam.   Past Medical History:  Diagnosis Date  . Anemia   . Aortic atherosclerosis ([place]) 12/03/2017   Noted on CT scan  . CAD (coronary artery disease), native coronary artery 12/03/2017   Noted on CT scan by calcification   . CHF (congestive heart failure) ([place])   . Chronic diastolic heart failure ([place]) 12/03/2017  . Dyspnea   . GERD (gastroesophageal reflux disease)   . Heart murmur   . History of blood transfusion    "related to chemo" (12/05/2017)  . Hypertension   . Hypertensive heart disease 12/03/2017    . Kidney disease, chronic, stage III (GFR 30-59 ml/min) ([place]) 12/03/2017  . Lung cancer ([place]) dx'd 02/2010  . Mitral valve prolapse   . Morbid obesity ([place]) 12/03/2017  . On home oxygen therapy   . 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 Paroxysmal atrial fibrillation (HCC)   . Persistent atrial fibrillation 12/03/2017  . Restrictive lung disease 05/23/2017    Assessment/Plan:    Principal Problem:   Trimalleolar fracture of ankle, closed, left, initial encounter Active Problems:   Cancer of right lung parenchyma (HCC)   Chronic diastolic heart failure (HCC)   Persistent atrial fibrillation; CHA2DS2Vacs = 6; Eliquis; Amiodarone for rhythm control   Chronic anticoagulation   Morbid obesity (HCC)   COPD (chronic obstructive pulmonary disease) ([place])   Fall at home, initial encounter  Estimated body mass index is 41.6 kg/m as calculated from the following:   Height as of this encounter: 5\' 5"  (1.651 m).   Weight as of this encounter: 113.4 kg.   DVT Prophylaxis - Eliquis Nonweightbearing left LE  Continue Eliquis until timeline for surgery determined. Probable stay in hospital until surgery. Monitor for compartment syndrome. Likely SNF placement postop.   Ardeen Jourdain, PA-C Orthopaedic Surgery 12/01/2018, 8:45 AM

## 2018-12-02 DIAGNOSIS — R609 Edema, unspecified: Secondary | ICD-10-CM | POA: Diagnosis not present

## 2018-12-02 DIAGNOSIS — K219 Gastro-esophageal reflux disease without esophagitis: Secondary | ICD-10-CM | POA: Diagnosis present

## 2018-12-02 DIAGNOSIS — I130 Hypertensive heart and chronic kidney disease with heart failure and stage 1 through stage 4 chronic kidney disease, or unspecified chronic kidney disease: Secondary | ICD-10-CM | POA: Diagnosis present

## 2018-12-02 DIAGNOSIS — Z9842 Cataract extraction status, left eye: Secondary | ICD-10-CM | POA: Diagnosis not present

## 2018-12-02 DIAGNOSIS — K5900 Constipation, unspecified: Secondary | ICD-10-CM | POA: Diagnosis present

## 2018-12-02 DIAGNOSIS — Z86010 Personal history of colonic polyps: Secondary | ICD-10-CM | POA: Diagnosis not present

## 2018-12-02 DIAGNOSIS — I5032 Chronic diastolic (congestive) heart failure: Secondary | ICD-10-CM | POA: Diagnosis present

## 2018-12-02 DIAGNOSIS — D509 Iron deficiency anemia, unspecified: Secondary | ICD-10-CM | POA: Diagnosis present

## 2018-12-02 DIAGNOSIS — Z825 Family history of asthma and other chronic lower respiratory diseases: Secondary | ICD-10-CM | POA: Diagnosis not present

## 2018-12-02 DIAGNOSIS — N184 Chronic kidney disease, stage 4 (severe): Secondary | ICD-10-CM | POA: Diagnosis present

## 2018-12-02 DIAGNOSIS — C3491 Malignant neoplasm of unspecified part of right bronchus or lung: Secondary | ICD-10-CM | POA: Diagnosis not present

## 2018-12-02 DIAGNOSIS — S9305XA Dislocation of left ankle joint, initial encounter: Secondary | ICD-10-CM | POA: Diagnosis present

## 2018-12-02 DIAGNOSIS — Z9981 Dependence on supplemental oxygen: Secondary | ICD-10-CM | POA: Diagnosis not present

## 2018-12-02 DIAGNOSIS — Z961 Presence of intraocular lens: Secondary | ICD-10-CM | POA: Diagnosis present

## 2018-12-02 DIAGNOSIS — Q7292 Unspecified reduction defect of left lower limb: Secondary | ICD-10-CM | POA: Diagnosis present

## 2018-12-02 DIAGNOSIS — I341 Nonrheumatic mitral (valve) prolapse: Secondary | ICD-10-CM | POA: Diagnosis present

## 2018-12-02 DIAGNOSIS — I2510 Atherosclerotic heart disease of native coronary artery without angina pectoris: Secondary | ICD-10-CM | POA: Diagnosis present

## 2018-12-02 DIAGNOSIS — D631 Anemia in chronic kidney disease: Secondary | ICD-10-CM | POA: Diagnosis present

## 2018-12-02 DIAGNOSIS — W108XXA Fall (on) (from) other stairs and steps, initial encounter: Secondary | ICD-10-CM | POA: Diagnosis present

## 2018-12-02 DIAGNOSIS — N179 Acute kidney failure, unspecified: Secondary | ICD-10-CM | POA: Diagnosis not present

## 2018-12-02 DIAGNOSIS — M7989 Other specified soft tissue disorders: Secondary | ICD-10-CM | POA: Diagnosis present

## 2018-12-02 DIAGNOSIS — J449 Chronic obstructive pulmonary disease, unspecified: Secondary | ICD-10-CM | POA: Diagnosis present

## 2018-12-02 DIAGNOSIS — Z7901 Long term (current) use of anticoagulants: Secondary | ICD-10-CM | POA: Diagnosis not present

## 2018-12-02 DIAGNOSIS — Y92008 Other place in unspecified non-institutional (private) residence as the place of occurrence of the external cause: Secondary | ICD-10-CM | POA: Diagnosis not present

## 2018-12-02 DIAGNOSIS — S82852A Displaced trimalleolar fracture of left lower leg, initial encounter for closed fracture: Secondary | ICD-10-CM | POA: Diagnosis present

## 2018-12-02 DIAGNOSIS — Z6841 Body Mass Index (BMI) 40.0 and over, adult: Secondary | ICD-10-CM | POA: Diagnosis not present

## 2018-12-02 DIAGNOSIS — I4819 Other persistent atrial fibrillation: Secondary | ICD-10-CM | POA: Diagnosis present

## 2018-12-02 LAB — COMPREHENSIVE METABOLIC PANEL
ALBUMIN: 2.6 g/dL — AB (ref 3.5–5.0)
ALT: 11 U/L (ref 0–44)
AST: 14 U/L — AB (ref 15–41)
Alkaline Phosphatase: 34 U/L — ABNORMAL LOW (ref 38–126)
Anion gap: 6 (ref 5–15)
BUN: 36 mg/dL — AB (ref 8–23)
CO2: 27 mmol/L (ref 22–32)
Calcium: 9.2 mg/dL (ref 8.9–10.3)
Chloride: 103 mmol/L (ref 98–111)
Creatinine, Ser: 2.46 mg/dL — ABNORMAL HIGH (ref 0.44–1.00)
GFR calc Af Amer: 21 mL/min — ABNORMAL LOW (ref >60)
GFR calc non Af Amer: 18 mL/min — ABNORMAL LOW (ref >60)
GLUCOSE: 124 mg/dL — AB (ref 70–99)
Potassium: 4 mmol/L (ref 3.5–5.1)
SODIUM: 136 mmol/L (ref 135–145)
Total Bilirubin: 0.6 mg/dL (ref 0.3–1.2)
Total Protein: 6.1 g/dL — ABNORMAL LOW (ref 6.5–8.1)

## 2018-12-02 LAB — CBC
HCT: 26.7 % — ABNORMAL LOW (ref 36.0–46.0)
Hemoglobin: 8.2 g/dL — ABNORMAL LOW (ref 12.0–15.0)
MCH: 30.6 pg (ref 26.0–34.0)
MCHC: 30.7 g/dL (ref 30.0–36.0)
MCV: 99.6 fL (ref 80.0–100.0)
Platelets: 146 10*3/uL — ABNORMAL LOW (ref 150–400)
RBC: 2.68 MIL/uL — ABNORMAL LOW (ref 3.87–5.11)
RDW: 14 % (ref 11.5–15.5)
WBC: 6.2 10*3/uL (ref 4.0–10.5)
nRBC: 0 % (ref 0.0–0.2)

## 2018-12-02 LAB — MAGNESIUM: Magnesium: 2.3 mg/dL (ref 1.7–2.4)

## 2018-12-02 MED ORDER — APIXABAN 5 MG PO TABS: 5.0000 mg | ORAL_TABLET | Freq: Two times a day (BID) | ORAL | Status: DC

## 2018-12-02 MED ORDER — FERROUS SULFATE 325 (65 FE) MG PO TABS
325.0000 mg | ORAL_TABLET | Freq: Every day | ORAL | Status: DC
  Administered 2018-12-03: 325 mg via ORAL
  Filled 2018-12-02: qty 1

## 2018-12-02 MED ORDER — SENNA 8.6 MG PO TABS
1.0000 | ORAL_TABLET | Freq: Every day | ORAL | Status: DC
  Administered 2018-12-02: 8.6 mg via ORAL
  Filled 2018-12-02: qty 1

## 2018-12-02 MED ORDER — APIXABAN 5 MG PO TABS
5.0000 mg | ORAL_TABLET | Freq: Two times a day (BID) | ORAL | Status: AC
  Administered 2018-12-02 – 2018-12-03 (×3): 5 mg via ORAL
  Filled 2018-12-02 (×3): qty 1

## 2018-12-02 MED ORDER — MUPIROCIN 2 % EX OINT
1.0000 "application " | TOPICAL_OINTMENT | Freq: Two times a day (BID) | CUTANEOUS | Status: AC
  Administered 2018-12-02 – 2018-12-06 (×5): 1 via NASAL
  Filled 2018-12-02 (×3): qty 22

## 2018-12-02 MED ORDER — POLYETHYLENE GLYCOL 3350 17 G PO PACK
17.0000 g | PACK | Freq: Two times a day (BID) | ORAL | Status: DC
  Administered 2018-12-02 – 2018-12-11 (×15): 17 g via ORAL
  Filled 2018-12-02 (×16): qty 1

## 2018-12-02 NOTE — Plan of Care (Signed)
  Problem: Pain Managment: Goal: General experience of comfort will improve Outcome: Progressing   

## 2018-12-02 NOTE — Progress Notes (Signed)
PROGRESS NOTE    Diane [name] [facility]  UJW:[id number] DOB: January 27, 1941 DOA: 11/29/2018 PCP: [name], MD   Brief Narrative:  Per HPI [name] is Diane [name] 78 y.o. female with medical history significant of COPD on 2 L of nasal cannula oxygen with activity, diastolic CHF, [name]. fib on Eliquis, CAD, MVP, HTN, HLD, and lung cancer s/p chemo followed by [name]; who presents after having Diane [name] fall at home.  Patient reports that she had just gotten back from the beauty shop and was coming up the steps.  She put her hand on the windowsill as she normally does to help get up the steps, but her hand slipped.  Patient fell backwards hitting her head on the concrete and twisting her left ankle.  She did not lose consciousness, but reported excruciating pain in swelling of the left ankle.  Her husband with whom she lives was unable to help her up and called EMS.  Patient reports that otherwise she has no complaints.     Assessment & Plan:   Principal Problem:   Trimalleolar fracture of ankle, closed, left, initial encounter Active Problems:   Cancer of right lung parenchyma (HCC)   Chronic diastolic heart failure (HCC)   Persistent atrial fibrillation; CHA2DS2Vacs = 6; Eliquis; Amiodarone for rhythm control   Chronic anticoagulation   Morbid obesity (HCC)   COPD (chronic obstructive pulmonary disease) ([place])   Fall at home, initial encounter   Fall with left ankle fracture:  Acute.  S/p mechanical fall at home onto the concrete.  X-rays reveal trimalleolar, distal tibia and fibular fractures.   Patient underwent closed reduction in the ED Orthopedics Dr. [name] consulted but recommended outpatient follow-up initially, but pt admitted as she was unable to ambulate.  PT recommending SNF.  Will pursue this after surgery on Friday, 1/10. - Appreciate Dr. [name] consultation, due to swelling, holding off on definitive fixation, surgery planned tentatively for Friday 1/10.  Eliquis will need to be held  48 hours prior to surgical intervention (last dose ordered for 1/7 PM). - oxycodone prn, tylenol scheduled, bowel regimen - Physical therapy to eval and treat - Social work and care management consults - Appreciate orthopedic consultative services, will follow-up for further recommendation  Acute kidney injury superimposed on chronic kidney disease stage IV: Patient presented with Mikael Debell creatinine of 2.4 with BUN 34.  Baseline creatinine previously noted to be around 2. - Creatinine worsened to peak at 3.19, improved today to 2.46 - of note, pt did get dose of torsemide on 1/3 (unclear reason) - pt does not appear volume overloaded, will continue IVF - Follow renal ultrasound (no acute findings or hydronephrosis) and CXR (R effusion and R basilar atelectasis) - Follow urinalysis (bland) - Hold nephrotoxic agents - Continue to monitor kidney function  Anemia: Hb has dropped to ~8 since presentation.  No si/sx bleeding.  Hemodynamically stable.  Stable at 8 today.   Iron deficiency, start iron Continue to monitor Will resume eliquis with stability  Thrombocytopenia: mild, continue to monitor, w/u further as indicated  Persistent atrial fibrillation on chronic anticoagulation:CHA2DS2-VASc score = 6 - Continue amiodarone - resume eliquis now, will give last dose 1/7 PM  Diastolic HF: Chronic.  Patient appears to be euvolemic at this time.  Last EF noted to be 65 to 70% by echocardiogram on 12/12/2017. - Strict I&O's and daily weights  COPD, without acute exacerbation: Patient reports being on oxygen of 2 L with activity.  O2 saturations maintained currently. -  Continuous pulse oximetry with nasal cannula oxygen - Continue Anoro Ellipta - DuoNeb's as needed for shortness of breath/wheezing  Adenocarcinoma of the lung: Patient followed in the outpatient setting by [name].  Currently not receiving treatment - Continue outpatient follow-up   Essential hypertension - Continue  hydralazine and Coreg  Hypokalemia: replace, follow  Morbid obesity, BMI 41.6   DVT prophylaxis: SCD's Code Status: full  Family Communication: none at bedside Disposition Plan: pending surgery.  Pt unsafe for discharge.  She'll require SNF placement at discharge and is currently awaiting surgery on 1/10.  Continues to require inpatient management.    Consultants:   Orthopedic surgery  Procedures:   Closed reduction of fracture 1/3  Antimicrobials: Anti-infectives (From admission, onward)   None     Subjective: Pain ok.  Asking about coreg and hydral, which she's currently receiving.  Objective: Vitals:   12/01/18 0725 12/01/18 1049 12/01/18 2044 12/02/18 0614  BP:  (!) 148/56 (!) 152/55 (!) 156/62  Pulse:  87 84 92  Resp:   18   Temp:   98.2 F (36.8 C) 98.7 F (37.1 C)  TempSrc:   Oral Oral  SpO2: 98%  99% 99%  Weight:      Height:        Intake/Output Summary (Last 24 hours) at 12/02/2018 1439 Last data filed at 12/02/2018 1300 Gross per 24 hour  Intake 1677.04 ml  Output 1450 ml  Net 227.04 ml   Filed Weights   11/29/18 1436  Weight: 113.4 kg    Examination:  General: No acute distress. Cardiovascular: Heart sounds show Chasten Blaze regular rate, and rhythm. Lungs: Clear to auscultation bilaterally  Abdomen: Soft, nontender, nondistended  Neurological: Alert and oriented 3. Moves all extremities 4. Cranial nerves II through XII grossly intact. Skin: Warm and dry. No rashes or lesions. Extremities: LLE in splint Psychiatric: Mood and affect are normal. Insight and judgment are appropriate.   Data Reviewed: I have personally reviewed following labs and imaging studies  CBC: Recent Labs  Lab 11/29/18 1506 11/29/18 1618 11/30/18 0357 12/01/18 0332 12/01/18 1539 12/02/18 0408  WBC 8.7  --  6.8 5.5  --  6.2  HGB 11.5* 11.9* 9.6* 8.1* 8.2* 8.2*  HCT 36.0 35.0* 30.2* 26.2* 26.2* 26.7*  MCV 98.1  --  97.4 98.1  --  99.6  PLT 190  --  173 128*  --   335*   Basic Metabolic Panel: Recent Labs  Lab 11/29/18 1506 11/29/18 1618 11/30/18 0357 11/30/18 1815 12/01/18 0332 12/02/18 0408  NA 139 138 137 132* 135 136  K 3.9 4.0 3.6 3.3* 3.2* 4.0  CL 100 99 101 98 100 103  CO2 31  --  27 26 27 27   GLUCOSE 116* 112* 103* 157* 107* 124*  BUN 32* 34* 35* 42* 40* 36*  CREATININE 2.38* 2.40* 2.63* 3.19* 2.85* 2.46*  CALCIUM 9.4  --  8.9 8.8* 9.2 9.2  MG  --   --   --   --  2.3 2.3   GFR: Estimated Creatinine Clearance: 23.7 mL/min (Melea Prezioso) (by C-G formula based on SCr of 2.46 mg/dL (H)). Liver Function Tests: Recent Labs  Lab 11/29/18 1506 12/02/18 0408  AST 20 14*  ALT 15 11  ALKPHOS 44 34*  BILITOT 0.8 0.6  PROT 7.0 6.1*  ALBUMIN 3.3* 2.6*   No results for input(s): LIPASE, AMYLASE in the last 168 hours. No results for input(s): AMMONIA in the last 168 hours. Coagulation Profile: Recent Labs  Lab 11/29/18 1506  INR 1.44   Cardiac Enzymes: No results for input(s): CKTOTAL, CKMB, CKMBINDEX, TROPONINI in the last 168 hours. BNP (last 3 results) No results for input(s): PROBNP in the last 8760 hours. HbA1C: No results for input(s): HGBA1C in the last 72 hours. CBG: No results for input(s): GLUCAP in the last 168 hours. Lipid Profile: No results for input(s): CHOL, HDL, LDLCALC, TRIG, CHOLHDL, LDLDIRECT in the last 72 hours. Thyroid Function Tests: No results for input(s): TSH, T4TOTAL, FREET4, T3FREE, THYROIDAB in the last 72 hours. Anemia Panel: Recent Labs    12/01/18 1539  VITAMINB12 2,039*  FOLATE 19.4  FERRITIN 102  TIBC 256  IRON 16*   Sepsis Labs: Recent Labs  Lab 11/29/18 1618  LATICACIDVEN 1.05    No results found for this or any previous visit (from the past 240 hour(s)).       Radiology Studies: US Renal  Result Date: 12/01/2018 CLINICAL DATA:  Acute kidney injury.  No chronic kidney disease. EXAM: RENAL / URINARY TRACT ULTRASOUND COMPLETE COMPARISON:  Renal ultrasound, 12/06/2017 FINDINGS:  Right Kidney: Renal measurements: 7.1 x 4.1 x 4.3 cm = volume: 66 mL. Normal parenchymal echogenicity. Diffuse cortical thinning. No masses, stones or hydronephrosis. Left Kidney: Renal measurements: 9.5 x 5.7 x 6.7 cm = volume: 189 mL. Normal parenchymal echogenicity. Mild diffuse renal cortical thinning. No masses, stones or hydronephrosis. Bladder: Appears normal for degree of bladder distention. IMPRESSION: 1. No acute findings.  No hydronephrosis. 2. Relatively small kidneys, right greater than left with bilateral renal cortical thinning, also greater on the right. No renal masses or stones. 3. No significant change compared to the prior exam. Electronically Signed   By: Lajean Manes M.D.   On: 12/01/2018 17:10   Dg Chest Port 1 View  Result Date: 12/01/2018 CLINICAL DATA:  Acute kidney injury EXAM: PORTABLE CHEST 1 VIEW COMPARISON:  Portable exam 1603 hours compared to 11/29/2018 FINDINGS: LEFT jugular Port-Deja Pisarski-Cath with tip projecting over SVC. Enlargement of cardiac silhouette. Mediastinal contours and pulmonary vascularity normal. Atherosclerotic calcifications aorta. Persistent RIGHT pleural effusion and basilar atelectasis. Minimal subsegmental atelectasis at LEFT base. No acute infiltrate or pneumothorax. IMPRESSION: Persistent RIGHT pleural effusion and RIGHT basilar atelectasis. Enlargement of cardiac silhouette with minimal LEFT basilar atelectasis. Electronically Signed   By: Lavonia Dana M.D.   On: 12/01/2018 16:14        Scheduled Meds: . acetaminophen  650 mg Oral Q6H  . amiodarone  200 mg Oral Daily  . calcitRIOL  0.5 mcg Oral Daily  . carvedilol  25 mg Oral BID WC  . [START ON 12/03/2018] ferrous sulfate  325 mg Oral Q breakfast  . hydrALAZINE  25 mg Oral BID  . Melatonin  9 mg Oral QHS  . polyethylene glycol  17 g Oral Daily  . umeclidinium-vilanterol  1 puff Inhalation Daily   Continuous Infusions: . lactated ringers 125 mL/hr at 12/02/18 0807     LOS: 0 days    Time  spent: over 30 min    Fayrene Helper, MD [facility] Pager [phone]   If 7PM-7AM, please contact night-coverage [email or web address] Password [facility] 12/02/2018, 2:39 PM

## 2018-12-02 NOTE — Progress Notes (Signed)
Subjective:   Procedure(s) (LRB): OPEN REDUCTION INTERNAL FIXATION (ORIF) ANKLE FRACTURE (Left)Left ankle, trimalleolar fracture Patient reports pain as mild.    Patient is well, and has had no acute complaints or problems other than discomfort in the left ankle. No SOB or chest pain. No issues overnight. Denies numbness and tingling in the left LE.    Objective: Vital signs in last 24 hours: Temp:  [98.2 F (36.8 C)-98.7 F (37.1 C)] 98.7 F (37.1 C) (01/06 0614) Pulse Rate:  [84-92] 92 (01/06 0614) Resp:  [18] 18 (01/05 2044) BP: (152-156)/(55-62) 156/62 (01/06 0614) SpO2:  [99 %] 99 % (01/06 0614)  Intake/Output from previous day:  Intake/Output Summary (Last 24 hours) at 12/02/2018 1219 Last data filed at 12/02/2018 0615 Gross per 24 hour  Intake 1557.04 ml  Output 1000 ml  Net 557.04 ml     Labs: Recent Labs    11/29/18 1618 11/30/18 0357 12/01/18 0332 12/01/18 1539 12/02/18 0408  HGB 11.9* 9.6* 8.1* 8.2* 8.2*   Recent Labs    12/01/18 0332 12/01/18 1539 12/02/18 0408  WBC 5.5  --  6.2  RBC 2.67*  --  2.68*  HCT 26.2* 26.2* 26.7*  PLT 128*  --  146*   Recent Labs    12/01/18 0332 12/02/18 0408  NA 135 136  K 3.2* 4.0  CL 100 103  CO2 27 27  BUN 40* 36*  CREATININE 2.85* 2.46*  GLUCOSE 107* 124*  CALCIUM 9.2 9.2   Recent Labs    11/29/18 1506  INR 1.44    EXAM General - Patient is Alert and Oriented Extremity - Neurologically intact Sensation intact distally Compartment soft Motor Function - intact, moving toes well on exam.   Past Medical History:  Diagnosis Date  . Anemia   . Aortic atherosclerosis ([place]) 12/03/2017   Noted on CT scan  . CAD (coronary artery disease), native coronary artery 12/03/2017   Noted on CT scan by calcification   . CHF (congestive heart failure) ([place])   . Chronic diastolic heart failure ([place]) 12/03/2017  . Dyspnea   . GERD (gastroesophageal reflux disease)   . Heart murmur   . History of blood  transfusion    "related to chemo" (12/05/2017)  . Hypertension   . Hypertensive heart disease 12/03/2017  . Kidney disease, chronic, stage III (GFR 30-59 ml/min) ([place]) 12/03/2017  . Lung cancer ([place]) dx'd 02/2010  . Mitral valve prolapse   . Morbid obesity ([place]) 12/03/2017  . On home oxygen therapy   . 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 Paroxysmal atrial fibrillation (HCC)   . Persistent atrial fibrillation 12/03/2017  . Restrictive lung disease 05/23/2017    Assessment/Plan:   Procedure(s) (LRB): OPEN REDUCTION INTERNAL FIXATION (ORIF) ANKLE FRACTURE (Left) Principal Problem:   Trimalleolar fracture of ankle, closed, left, initial encounter Active Problems:   Cancer of right lung parenchyma (HCC)   Chronic diastolic heart failure (HCC)   Persistent atrial fibrillation; CHA2DS2Vacs = 6; Eliquis; Amiodarone for rhythm control   Chronic anticoagulation   Morbid obesity (HCC)   COPD (chronic obstructive pulmonary disease) ([place])   Fall at home, initial encounter  Estimated body mass index is 41.6 kg/m as calculated from the following:   Height as of this encounter: 5\' 5"  (1.651 m).   Weight as of this encounter: 113.4 kg.   DVT Prophylaxis - Eliquis Nonweightbearing left LE  Surgery scheduled for [facility].  nmaintain inpt until surgery due to issues with mobility and  help or lack of at home - strict elevation - NPO MN on Thursday this week.  Victorino December, MD Orthopaedic Surgery 12/02/2018, 12:19 PM

## 2018-12-03 LAB — COMPREHENSIVE METABOLIC PANEL
ALT: 10 U/L (ref 0–44)
AST: 13 U/L — ABNORMAL LOW (ref 15–41)
Albumin: 2.3 g/dL — ABNORMAL LOW (ref 3.5–5.0)
Alkaline Phosphatase: 30 U/L — ABNORMAL LOW (ref 38–126)
Anion gap: 7 (ref 5–15)
BILIRUBIN TOTAL: 0.9 mg/dL (ref 0.3–1.2)
BUN: 27 mg/dL — ABNORMAL HIGH (ref 8–23)
CO2: 27 mmol/L (ref 22–32)
Calcium: 9.5 mg/dL (ref 8.9–10.3)
Chloride: 103 mmol/L (ref 98–111)
Creatinine, Ser: 2.02 mg/dL — ABNORMAL HIGH (ref 0.44–1.00)
GFR calc Af Amer: 27 mL/min — ABNORMAL LOW (ref >60)
GFR calc non Af Amer: 23 mL/min — ABNORMAL LOW (ref >60)
Glucose, Bld: 107 mg/dL — ABNORMAL HIGH (ref 70–99)
Potassium: 3.9 mmol/L (ref 3.5–5.1)
Sodium: 137 mmol/L (ref 135–145)
Total Protein: 5.8 g/dL — ABNORMAL LOW (ref 6.5–8.1)

## 2018-12-03 LAB — CBC
HCT: 25.3 % — ABNORMAL LOW (ref 36.0–46.0)
Hemoglobin: 7.9 g/dL — ABNORMAL LOW (ref 12.0–15.0)
MCH: 30.9 pg (ref 26.0–34.0)
MCHC: 31.2 g/dL (ref 30.0–36.0)
MCV: 98.8 fL (ref 80.0–100.0)
Platelets: 149 10*3/uL — ABNORMAL LOW (ref 150–400)
RBC: 2.56 MIL/uL — ABNORMAL LOW (ref 3.87–5.11)
RDW: 14.1 % (ref 11.5–15.5)
WBC: 5.8 10*3/uL (ref 4.0–10.5)
nRBC: 0 % (ref 0.0–0.2)

## 2018-12-03 LAB — MAGNESIUM: Magnesium: 2.2 mg/dL (ref 1.7–2.4)

## 2018-12-03 MED ORDER — SENNA 8.6 MG PO TABS
2.0000 | ORAL_TABLET | Freq: Every day | ORAL | Status: DC
  Administered 2018-12-03 – 2018-12-09 (×7): 17.2 mg via ORAL
  Filled 2018-12-03 (×7): qty 2

## 2018-12-03 NOTE — Progress Notes (Signed)
PROGRESS NOTE    Diane [name]  UEA:[id number] DOB: 22-Sep-1941 DOA: 11/29/2018 PCP: Diane Downing, MD   Brief Narrative:  Per HPI Diane [name] is Diane [name] 78 y.o. female with medical history significant of COPD on 2 L of nasal cannula oxygen with activity, diastolic CHF, Diane [name]. fib on Eliquis, CAD, MVP, HTN, HLD, and lung cancer s/p chemo followed by Dr. Earlie [name]; who presents after having Diane [name] fall at home.  Patient reports that she had just gotten back from the beauty shop and was coming up the steps.  She put her hand on the windowsill as she normally does to help get up the steps, but her hand slipped.  Patient fell backwards hitting her head on the concrete and twisting her left ankle.  She did not lose consciousness, but reported excruciating pain in swelling of the left ankle.  Her husband with whom she lives was unable to help her up and called EMS.  Patient reports that otherwise she has no complaints.     Assessment & Plan:   Principal Problem:   Trimalleolar fracture of ankle, closed, left, initial encounter Active Problems:   Cancer of right lung parenchyma (HCC)   Chronic diastolic heart failure (HCC)   Persistent atrial fibrillation; CHA2DS2Vacs = 6; Eliquis; Amiodarone for rhythm control   Chronic anticoagulation   Morbid obesity (HCC)   COPD (chronic obstructive pulmonary disease) ([place])   Fall at home, initial encounter   Fall with left ankle fracture:  Acute.  S/p mechanical fall at home onto the concrete.  X-rays reveal trimalleolar, distal tibia and fibular fractures.   Patient underwent closed reduction in the ED Orthopedics Diane [name] consulted but recommended outpatient follow-up initially, but pt admitted as she was unable to ambulate.  PT recommending SNF.  Will pursue this after surgery on Friday, 1/10. - Appreciate Diane [name] consultation, due to swelling, holding off on definitive fixation, surgery planned tentatively for Friday 1/10.  Eliquis will need to be held  48 hours prior to surgical intervention (last dose ordered for 1/7 PM). - oxycodone prn, tylenol scheduled, bowel regimen - Physical therapy to eval and treat - Social work and care management consults - Appreciate orthopedic consultative services, will follow-up for further recommendation  Acute kidney injury superimposed on chronic kidney disease stage IV: Patient presented with Diane [name] creatinine of 2.4 with BUN 34.  Baseline creatinine previously noted to be around 2. - Creatinine worsened to peak at 3.19, improved today to ~2 - of note, pt did get dose of torsemide on 1/3 (unclear reason) - pt does not appear volume overloaded - Follow renal ultrasound (no acute findings or hydronephrosis) and CXR (R effusion and R basilar atelectasis) - Follow urinalysis (bland) - Hold nephrotoxic agents - Continue to monitor kidney function  Anemia: Hb has dropped to ~8 since presentation.  No si/sx bleeding.  Hemodynamically stable.  Stable at ~7.9 today.   Iron deficiency with component of AOCD, will hold iron for now with constipation until she has BM Continue to monitor  Elevated Temperature: x1 to 100.1.  Continue to monitor for now, if recurrent or temp >100.4, work up further.  Thrombocytopenia: mild, continue to monitor, w/u further as indicated. Stable.  Persistent atrial fibrillation on chronic anticoagulation:CHA2DS2-VASc score = 6 - Continue amiodarone - eliquis, will give last dose 1/7 PM  Diastolic HF: Chronic.  Patient appears to be euvolemic at this time.  Last EF noted to be 65 to 70% by echocardiogram on 12/12/2017. - Strict  I&O's and daily weights  COPD, without acute exacerbation: Patient reports being on oxygen of 2 L with activity.  O2 saturations maintained currently. - Continuous pulse oximetry with nasal cannula oxygen - Continue Anoro Ellipta - DuoNeb's as needed for shortness of breath/wheezing  Adenocarcinoma of the lung: Patient followed in the outpatient setting by  Dr. Earlie [name].  Currently not receiving treatment - Continue outpatient follow-up   Essential hypertension - Continue hydralazine and Coreg  Hypokalemia: replace, follow  Left Upper Extremity Swelling: mild, no pain, pt noted this yesterday, maybe related to IVF.  She's got 1+ edema to her RLE (LLE in splint).  Discussed considering LUE [name] to r/o VTE (unlikely as pt on eliquis and was only held for 1 day), but she declined this.  Continue to monitor closely, w/u further as indicated.  Constipation: continue bowel regimen, hold iron, continue to monitor  Morbid obesity, BMI 41.6   DVT prophylaxis: SCD's Code Status: full  Family Communication: none at bedside Disposition Plan: pending surgery.  Pt unsafe for discharge.  She'll require SNF placement at discharge and is currently awaiting surgery on 1/10.  Continues to require inpatient management.    Consultants:   Orthopedic surgery  Procedures:   Closed reduction of fracture 1/3  Antimicrobials: Anti-infectives (From admission, onward)   None     Subjective: Noticed LUE swelling yesterday No pain.  Working with PT.  Objective: Vitals:   12/02/18 2202 12/03/18 0033 12/03/18 0524 12/03/18 0830  BP: (!) 155/67 139/62 (!) 159/55   Pulse:  89 89   Resp:  16 20   Temp:  98.4 F (36.9 C) 100.1 F (37.8 C)   TempSrc:  Oral Oral   SpO2:  96% 99% 98%  Weight:      Height:        Intake/Output Summary (Last 24 hours) at 12/03/2018 1221 Last data filed at 12/02/2018 1521 Gross per 24 hour  Intake 1774.49 ml  Output 450 ml  Net 1324.49 ml   Filed Weights   11/29/18 1436  Weight: 113.4 kg    Examination:  General: No acute distress. Cardiovascular: Heart sounds show Diane [name] regular rate, and rhythm.  Lungs: Clear to auscultation bilaterally Abdomen: Soft, nontender, nondistended Neurological: Alert and oriented 3. Moves all extremities 4. Cranial nerves II through XII grossly intact. Skin: Warm and dry. No rashes or  lesions. Extremities: 1+ edema to RLE, LLE in splint.  Edema noted to LUE.  Psychiatric: Mood and affect are normal. Insight and judgment are appropriate.   Data Reviewed: I have personally reviewed following labs and imaging studies  CBC: Recent Labs  Lab 11/29/18 1506  11/30/18 0357 12/01/18 0332 12/01/18 1539 12/02/18 0408 12/03/18 0344  WBC 8.7  --  6.8 5.5  --  6.2 5.8  HGB 11.5*   < > 9.6* 8.1* 8.2* 8.2* 7.9*  HCT 36.0   < > 30.2* 26.2* 26.2* 26.7* 25.3*  MCV 98.1  --  97.4 98.1  --  99.6 98.8  PLT 190  --  173 128*  --  146* 149*   < > = values in this interval not displayed.   Basic Metabolic Panel: Recent Labs  Lab 11/30/18 0357 11/30/18 1815 12/01/18 0332 12/02/18 0408 12/03/18 0344  NA 137 132* 135 136 137  K 3.6 3.3* 3.2* 4.0 3.9  CL 101 98 100 103 103  CO2 27 26 27 27 27   GLUCOSE 103* 157* 107* 124* 107*  BUN 35* 42* 40* 36* 27*  CREATININE 2.63* 3.19* 2.85* 2.46* 2.02*  CALCIUM 8.9 8.8* 9.2 9.2 9.5  MG  --   --  2.3 2.3 2.2   GFR: Estimated Creatinine Clearance: 28.8 mL/min (Rohit Deloria) (by C-G formula based on SCr of 2.02 mg/dL (H)). Liver Function Tests: Recent Labs  Lab 11/29/18 1506 12/02/18 0408 12/03/18 0344  AST 20 14* 13*  ALT 15 11 10   ALKPHOS 44 34* 30*  BILITOT 0.8 0.6 0.9  PROT 7.0 6.1* 5.8*  ALBUMIN 3.3* 2.6* 2.3*   No results for input(s): LIPASE, AMYLASE in the last 168 hours. No results for input(s): AMMONIA in the last 168 hours. Coagulation Profile: Recent Labs  Lab 11/29/18 1506  INR 1.44   Cardiac Enzymes: No results for input(s): CKTOTAL, CKMB, CKMBINDEX, TROPONINI in the last 168 hours. BNP (last 3 results) No results for input(s): PROBNP in the last 8760 hours. HbA1C: No results for input(s): HGBA1C in the last 72 hours. CBG: No results for input(s): GLUCAP in the last 168 hours. Lipid Profile: No results for input(s): CHOL, HDL, LDLCALC, TRIG, CHOLHDL, LDLDIRECT in the last 72 hours. Thyroid Function Tests: No  results for input(s): TSH, T4TOTAL, FREET4, T3FREE, THYROIDAB in the last 72 hours. Anemia Panel: Recent Labs    12/01/18 1539  VITAMINB12 2,039*  FOLATE 19.4  FERRITIN 102  TIBC 256  IRON 16*   Sepsis Labs: Recent Labs  Lab 11/29/18 1618  LATICACIDVEN 1.05    No results found for this or any previous visit (from the past 240 hour(s)).       Radiology Studies: US Renal  Result Date: 12/01/2018 CLINICAL DATA:  Acute kidney injury.  No chronic kidney disease. EXAM: RENAL / URINARY TRACT ULTRASOUND COMPLETE COMPARISON:  Renal ultrasound, 12/06/2017 FINDINGS: Right Kidney: Renal measurements: 7.1 x 4.1 x 4.3 cm = volume: 66 mL. Normal parenchymal echogenicity. Diffuse cortical thinning. No masses, stones or hydronephrosis. Left Kidney: Renal measurements: 9.5 x 5.7 x 6.7 cm = volume: 189 mL. Normal parenchymal echogenicity. Mild diffuse renal cortical thinning. No masses, stones or hydronephrosis. Bladder: Appears normal for degree of bladder distention. IMPRESSION: 1. No acute findings.  No hydronephrosis. 2. Relatively small kidneys, right greater than left with bilateral renal cortical thinning, also greater on the right. No renal masses or stones. 3. No significant change compared to the prior exam. Electronically Signed   By: Lajean Manes M.D.   On: 12/01/2018 17:10   Dg Chest Port 1 View  Result Date: 12/01/2018 CLINICAL DATA:  Acute kidney injury EXAM: PORTABLE CHEST 1 VIEW COMPARISON:  Portable exam 1603 hours compared to 11/29/2018 FINDINGS: LEFT jugular Port-Yadriel Kerrigan-Cath with tip projecting over SVC. Enlargement of cardiac silhouette. Mediastinal contours and pulmonary vascularity normal. Atherosclerotic calcifications aorta. Persistent RIGHT pleural effusion and basilar atelectasis. Minimal subsegmental atelectasis at LEFT base. No acute infiltrate or pneumothorax. IMPRESSION: Persistent RIGHT pleural effusion and RIGHT basilar atelectasis. Enlargement of cardiac silhouette with  minimal LEFT basilar atelectasis. Electronically Signed   By: Lavonia Dana M.D.   On: 12/01/2018 16:14        Scheduled Meds: . acetaminophen  650 mg Oral Q6H  . amiodarone  200 mg Oral Daily  . apixaban  5 mg Oral BID  . calcitRIOL  0.5 mcg Oral Daily  . carvedilol  25 mg Oral BID WC  . hydrALAZINE  25 mg Oral BID  . Melatonin  9 mg Oral QHS  . mupirocin ointment  1 application Nasal BID  . polyethylene glycol  17 g Oral  BID  . senna  2 tablet Oral QHS  . umeclidinium-vilanterol  1 puff Inhalation Daily   Continuous Infusions:    LOS: 1 day    Time spent: over 30 min    Fayrene Helper, MD [facility] Pager [phone]   If 7PM-7AM, please contact night-coverage [email or web address] Password [facility] [name] 12/03/2018, 12:21 PM

## 2018-12-03 NOTE — Discharge Instructions (Signed)

## 2018-12-03 NOTE — Progress Notes (Addendum)
Physical Therapy Treatment Patient Details Name: Diane [name] MRN: [id number] DOB: [birth year] Today's Date: 12/03/2018    History of Present Illness Pt is a 78 y.o. female with medical history significant for COPD on 2 L of nasal cannula oxygen with activity, diastolic CHF, A. fib on Eliquis, CAD, MVP, HTN, HLD, and lung cancer s/p chemo.  She presented to the ED following a fall at home. Xray revealed L ankle fx. Surgical repair is tentatively scheduled for Friday, 12/06/18, to allow for soft tissue rest and swelling resolution.     PT Comments    Pt pleasant and wanting to get to bathroom. On arrival pt in stedy with nursing with pt attempting to keep weight off LLE but unsuccessful. Pt educated for bil LE HEP, arm rest pushups and need to progress with lateral scoot transfers at this time due to inability to stand and maintain NWB. Pt verbalized understanding and nursing educated for need to use maximove for all transfers.    Follow Up Recommendations  SNF;Supervision/Assistance - 24 hour     Equipment Recommendations  Wheelchair (measurements PT)    Recommendations for Other Services       Precautions / Restrictions Precautions Precautions: Fall Restrictions Weight Bearing Restrictions: Yes LLE Weight Bearing: Non weight bearing    Mobility  Bed Mobility               General bed mobility comments: pt EOB on arrival  Transfers Overall transfer level: Needs assistance   Transfers: Sit to/from Stand Sit to Stand: Max assist;+2 physical assistance;From elevated surface         General transfer comment: Pt standing in stedy EOB with 3 nurse techs with Left ball of foot on stedy platform, bed stripped and assisted to quickly pivot pt to chair from stedy and get all weight off LLE. Pt attempted to stand from low chair with LLE on P.T. foot and +2 max assist but unable to rise. Nursing educated for [facility] for all transfers  Ambulation/Gait             General  Gait Details: unable due to NWB LLE   Stairs             Wheelchair Mobility    Modified Rankin (Stroke Patients Only)       Balance Overall balance assessment: Needs assistance                                          Cognition Arousal/Alertness: Awake/alert Behavior During Therapy: WFL for tasks assessed/performed Overall Cognitive Status: Within Functional Limits for tasks assessed                                        Exercises General Exercises - Upper Extremity Chair Push Up: AROM;5 reps;Seated(unable to significantly move body, isometric) General Exercises - Lower Extremity Long Arc Quad: AAROM;Left;Right;10 reps;Seated;AROM(AROm RLE, AAROM LLE) Hip Flexion/Marching: AROM;AAROM;10 reps;Seated(AROM RLe, AAROM LLE)    General Comments        Pertinent Vitals/Pain Pain Score: 5  Pain Location: LLE with mobility Pain Descriptors / Indicators: Sore Pain Intervention(s): Limited activity within patient's tolerance;Monitored during session;Repositioned    Home Living  Prior Function            PT Goals (current goals can now be found in the care plan section) Progress towards PT goals: Progressing toward goals    Frequency    Min 3X/week      PT Plan Current plan remains appropriate    Co-evaluation              AM-PAC PT "6 Clicks" Mobility   Outcome Measure  Help needed turning from your back to your side while in a flat bed without using bedrails?: A Little Help needed moving from lying on your back to sitting on the side of a flat bed without using bedrails?: A Lot Help needed moving to and from a bed to a chair (including a wheelchair)?: Total Help needed standing up from a chair using your arms (e.g., wheelchair or bedside chair)?: Total Help needed to walk in hospital room?: Total Help needed climbing 3-5 steps with a railing? : Total 6 Click Score: 9    End of  Session Equipment Utilized During Treatment: Oxygen Activity Tolerance: Patient limited by fatigue Patient left: in chair;with call bell/phone within reach Nurse Communication: Mobility status;Need for lift equipment PT Visit Diagnosis: Other abnormalities of gait and mobility (R26.89);Pain;Muscle weakness (generalized) (M62.81) Pain - Right/Left: Left Pain - part of body: Ankle and joints of foot     Time: 4799-8721 PT Time Calculation (min) (ACUTE ONLY): 23 min  Charges:  $Therapeutic Exercise: 8-22 mins $Therapeutic Activity: 8-22 mins                     [place], PT Acute Rehabilitation Services Pager: [phone] Office: [place] 12/03/2018, 11:27 AM

## 2018-12-04 LAB — CBC
HCT: 25.5 % — ABNORMAL LOW (ref 36.0–46.0)
Hemoglobin: 8.1 g/dL — ABNORMAL LOW (ref 12.0–15.0)
MCH: 31.5 pg (ref 26.0–34.0)
MCHC: 31.8 g/dL (ref 30.0–36.0)
MCV: 99.2 fL (ref 80.0–100.0)
Platelets: 166 10*3/uL (ref 150–400)
RBC: 2.57 MIL/uL — ABNORMAL LOW (ref 3.87–5.11)
RDW: 14 % (ref 11.5–15.5)
WBC: 6.2 10*3/uL (ref 4.0–10.5)
nRBC: 0 % (ref 0.0–0.2)

## 2018-12-04 LAB — BASIC METABOLIC PANEL
ANION GAP: 4 — AB (ref 5–15)
BUN: 23 mg/dL (ref 8–23)
CO2: 27 mmol/L (ref 22–32)
Calcium: 9.7 mg/dL (ref 8.9–10.3)
Chloride: 105 mmol/L (ref 98–111)
Creatinine, Ser: 1.79 mg/dL — ABNORMAL HIGH (ref 0.44–1.00)
GFR calc Af Amer: 31 mL/min — ABNORMAL LOW (ref >60)
GFR calc non Af Amer: 27 mL/min — ABNORMAL LOW (ref >60)
GLUCOSE: 101 mg/dL — AB (ref 70–99)
Potassium: 3.8 mmol/L (ref 3.5–5.1)
Sodium: 136 mmol/L (ref 135–145)

## 2018-12-04 LAB — SURGICAL PCR SCREEN
MRSA, PCR: NEGATIVE
Staphylococcus aureus: NEGATIVE

## 2018-12-04 LAB — MAGNESIUM: Magnesium: 2.4 mg/dL (ref 1.7–2.4)

## 2018-12-04 MED ORDER — MENTHOL 3 MG MT LOZG
1.0000 | LOZENGE | OROMUCOSAL | Status: DC | PRN
  Administered 2018-12-04: 3 mg via ORAL
  Filled 2018-12-04: qty 9

## 2018-12-04 NOTE — Progress Notes (Addendum)
PROGRESS NOTE    Diane [name] [facility]  HKV:[id number] DOB: 1941/01/26 DOA: 11/29/2018 PCP: [name], MD   Brief Narrative:  HPI on 11/29/2018 by Dr. Fuller Plan Diane [name] is a 78 y.o. female with medical history significant of COPD on 2 L of nasal cannula oxygen with activity, diastolic CHF, A. fib on Eliquis, CAD, MVP, HTN, HLD, and lung cancer s/p chemo followed by [name]; who presents after having a fall at home.  Patient reports that she had just gotten back from the beauty shop and was coming up the steps.  She put her hand on the windowsill as she normally does to help get up the steps, but her hand slipped.  Patient fell backwards hitting her head on the concrete and twisting her left ankle.  She did not lose consciousness, but reported excruciating pain in swelling of the left ankle.  Her husband with whom she lives was unable to help her up and called EMS.  Patient reports that otherwise she has no complaints.  Denies fever, chills, chest pain,  Interim history Admitted with left ankle fracture, orthopedics consulted.  Plan for surgery on 12/06/2018.  Assessment & Plan   Fall with left ankle fracture -patient fell at home onto the concrete -X-rays revealed trimalleolar, distal tibia and fibular fractures -Patient underwent close reduction in the emergency department -Orthopedic surgery consulted and appreciated.  Dr. [name] initially recommended outpatient follow-up, however patient was admitted as she was unable to ambulate.  PT recommending SNF. -Surgery planned for 12/06/2018 as Eliquis will need to be held for 48 hours prior to surgical intervention. -Continue pain control  Acute kidney injury on chronic kidney disease, stage IV -Creatinine peaked to 3.19, however appears to be improving, currently 1.79 (baseline around 2) -Renal ultrasound showed no acute findings or hydronephrosis -UA unremarkable for infection -Continue to avoid nephrotoxic agents -placed on  IVF  -Monitor BMP  Anemia, likely of chronic disease -Hemoglobin currently 8.1 (upon review of patient's chart, her baseline hemoglobin ranges from 8-10) -Upon admission, patient did have a hemoglobin of 11.5 however drop may be dilutional  -No active signs of bleeding -Continue to monitor CBC  Low-grade fever -Patient with T-max of 100.1 F, no recurrent temperature -Possibly reactive to the above  Thrombocytopenia -Resolved, transient-unknown etiology -Continue to monitor CBC  Persistent atrial fibrillation -CHADSVASC score 6 -Patient on Eliquis, will be held after this evening's dose for surgery -Continue Pacerone  Chronic diastolic heart failure -Patient appears to be compensated and euvolemic at this time -Echocardiogram 12/12/2017 showed an EF of 65 to 70% -Given acute kidney injury, torsemide held -Continue to monitor intake and output, daily weights  COPD -Without acute exacerbation -Patient uses home oxygen of 2 L with activity -Continue home medications as well as nebulizer treatments as needed  Adenocarcinoma of the lung -Followed by oncology, [name]  Essential hypertension -Continue hydralazine, Coreg  Hypokalemia -Resolved with replacement -Continue to monitor BMP  Left upper extremity swelling -Seems to be improving.  Patient states that she may have fallen but does not remember.  She is able to move her wrist without eliciting pain.  Patient also received IV fluid. -Will continue to monitor closely  Constipation -Continue bowel regimen, iron held  Morbid obesity -BMI 41.6 -Patient will need to discuss lifestyle modifications including diet exercise etc. with PCP on discharge  DVT Prophylaxis Eliquis  Code Status: Full  Family Communication: None at bedside  Disposition Plan: Admitted.  Will likely be discharged  to SNF however pending surgery on 12/06/2018  Consultants Orthopedic surgery  Procedures  Renal ultrasound  Antibiotics     Anti-infectives (From admission, onward)   None      Subjective:   [facility] seen and examined today.  No complaints today. Denies current chest pain, shortness of breath, abdominal pain, nausea or vomiting, diarrhea, dizziness or headache.  Objective:   Vitals:   12/04/18 0041 12/04/18 0449 12/04/18 0741 12/04/18 1346  BP: (!) 151/60 (!) 146/59  (!) 135/46  Pulse: 87 87  83  Resp:    17  Temp: 98.6 F (37 C) 98.3 F (36.8 C)  98.2 F (36.8 C)  TempSrc: Oral Oral  Oral  SpO2: 99% 100% 100% 100%  Weight:      Height:        Intake/Output Summary (Last 24 hours) at 12/04/2018 1359 Last data filed at 12/04/2018 0800 Gross per 24 hour  Intake 510 ml  Output 301 ml  Net 209 ml   Filed Weights   11/29/18 1436  Weight: 113.4 kg   Exam  General: Well developed, well nourished, NAD, appears stated age  HEENT: NCAT, mucous membranes moist.   Neck: Supple  Cardiovascular: S1 S2 auscultated, RRR, no murmur  Respiratory: Clear to auscultation bilaterally with equal chest rise  Abdomen: Soft, obese, nontender, nondistended, + bowel sounds  Extremities: warm dry without cyanosis clubbing. RLE +1 edema. LLE in splint. LUE edema, mild.  Neuro: AAOx3, nonfocal  Psych: Normal affect and demeanor with intact judgement and insight  Data Reviewed: I have personally reviewed following labs and imaging studies  CBC: Recent Labs  Lab 11/30/18 0357 12/01/18 0332 12/01/18 1539 12/02/18 0408 12/03/18 0344 12/04/18 0339  WBC 6.8 5.5  --  6.2 5.8 6.2  HGB 9.6* 8.1* 8.2* 8.2* 7.9* 8.1*  HCT 30.2* 26.2* 26.2* 26.7* 25.3* 25.5*  MCV 97.4 98.1  --  99.6 98.8 99.2  PLT 173 128*  --  146* 149* 694   Basic Metabolic Panel: Recent Labs  Lab 11/30/18 1815 12/01/18 0332 12/02/18 0408 12/03/18 0344 12/04/18 0339  NA 132* 135 136 137 136  K 3.3* 3.2* 4.0 3.9 3.8  CL 98 100 103 103 105  CO2 26 27 27 27 27   GLUCOSE 157* 107* 124* 107* 101*  BUN 42* 40* 36* 27* 23   CREATININE 3.19* 2.85* 2.46* 2.02* 1.79*  CALCIUM 8.8* 9.2 9.2 9.5 9.7  MG  --  2.3 2.3 2.2 2.4   GFR: Estimated Creatinine Clearance: 32.5 mL/min (A) (by C-G formula based on SCr of 1.79 mg/dL (H)). Liver Function Tests: Recent Labs  Lab 11/29/18 1506 12/02/18 0408 12/03/18 0344  AST 20 14* 13*  ALT 15 11 10   ALKPHOS 44 34* 30*  BILITOT 0.8 0.6 0.9  PROT 7.0 6.1* 5.8*  ALBUMIN 3.3* 2.6* 2.3*   No results for input(s): LIPASE, AMYLASE in the last 168 hours. No results for input(s): AMMONIA in the last 168 hours. Coagulation Profile: Recent Labs  Lab 11/29/18 1506  INR 1.44   Cardiac Enzymes: No results for input(s): CKTOTAL, CKMB, CKMBINDEX, TROPONINI in the last 168 hours. BNP (last 3 results) No results for input(s): PROBNP in the last 8760 hours. HbA1C: No results for input(s): HGBA1C in the last 72 hours. CBG: No results for input(s): GLUCAP in the last 168 hours. Lipid Profile: No results for input(s): CHOL, HDL, LDLCALC, TRIG, CHOLHDL, LDLDIRECT in the last 72 hours. Thyroid Function Tests: No results for input(s): TSH, T4TOTAL,  FREET4, T3FREE, THYROIDAB in the last 72 hours. Anemia Panel: Recent Labs    12/01/18 1539  VITAMINB12 2,039*  FOLATE 19.4  FERRITIN 102  TIBC 256  IRON 16*   Urine analysis:    Component Value Date/Time   COLORURINE YELLOW 11/30/2018 1835   APPEARANCEUR HAZY (A) 11/30/2018 1835   LABSPEC 1.012 11/30/2018 1835   PHURINE 5.0 11/30/2018 1835   GLUCOSEU NEGATIVE 11/30/2018 1835   HGBUR NEGATIVE 11/30/2018 1835   BILIRUBINUR NEGATIVE 11/30/2018 1835   KETONESUR NEGATIVE 11/30/2018 1835   PROTEINUR NEGATIVE 11/30/2018 1835   UROBILINOGEN 1.0 12/05/2009 1303   NITRITE NEGATIVE 11/30/2018 1835   LEUKOCYTESUR NEGATIVE 11/30/2018 1835   Sepsis Labs: @LABRCNTIP (procalcitonin:4,lacticidven:4)  )No results found for this or any previous visit (from the past 240 hour(s)).    Radiology Studies: No results found.   Scheduled  Meds: . acetaminophen  650 mg Oral Q6H  . amiodarone  200 mg Oral Daily  . calcitRIOL  0.5 mcg Oral Daily  . carvedilol  25 mg Oral BID WC  . hydrALAZINE  25 mg Oral BID  . Melatonin  9 mg Oral QHS  . mupirocin ointment  1 application Nasal BID  . polyethylene glycol  17 g Oral BID  . senna  2 tablet Oral QHS  . umeclidinium-vilanterol  1 puff Inhalation Daily   Continuous Infusions:   LOS: 2 days   Time Spent in minutes   30 minutes  Roque Schill D.O. on 12/04/2018 at 1:59 PM  Between 7am to 7pm - Please see pager noted on [email or web address]  After 7pm go to [email or web address]  And look for the night coverage person covering for me after hours  [facility] Office  [phone]

## 2018-12-04 NOTE — Plan of Care (Signed)
  Problem: Elimination: Goal: Will not experience complications related to bowel motility Outcome: Progressing   

## 2018-12-05 LAB — BASIC METABOLIC PANEL
Anion gap: 6 (ref 5–15)
BUN: 23 mg/dL (ref 8–23)
CALCIUM: 9.6 mg/dL (ref 8.9–10.3)
CO2: 27 mmol/L (ref 22–32)
CREATININE: 1.68 mg/dL — AB (ref 0.44–1.00)
Chloride: 104 mmol/L (ref 98–111)
GFR, EST AFRICAN AMERICAN: 33 mL/min — AB (ref >60)
GFR, EST NON AFRICAN AMERICAN: 29 mL/min — AB (ref >60)
Glucose, Bld: 109 mg/dL — ABNORMAL HIGH (ref 70–99)
Potassium: 3.8 mmol/L (ref 3.5–5.1)
Sodium: 137 mmol/L (ref 135–145)

## 2018-12-05 LAB — HEMOGLOBIN AND HEMATOCRIT, BLOOD
HCT: 24.7 % — ABNORMAL LOW (ref 36.0–46.0)
HCT: 26.3 % — ABNORMAL LOW (ref 36.0–46.0)
HEMOGLOBIN: 7.6 g/dL — AB (ref 12.0–15.0)
Hemoglobin: 7.9 g/dL — ABNORMAL LOW (ref 12.0–15.0)

## 2018-12-05 MED ORDER — CHLORHEXIDINE GLUCONATE 4 % EX LIQD
60.0000 mL | Freq: Once | CUTANEOUS | Status: DC
  Filled 2018-12-05: qty 30

## 2018-12-05 MED ORDER — POVIDONE-IODINE 10 % EX SWAB: 2.0000 "application " | Freq: Once | CUTANEOUS | Status: DC

## 2018-12-05 MED ORDER — SODIUM CHLORIDE 0.9 % IV SOLN
510.0000 mg | Freq: Once | INTRAVENOUS | Status: AC
  Administered 2018-12-05: 510 mg via INTRAVENOUS
  Filled 2018-12-05: qty 17

## 2018-12-05 NOTE — Progress Notes (Signed)
PROGRESS NOTE    Diane [name] [facility]  OYD:[id number] DOB: Apr 04, 1941 DOA: 11/29/2018 PCP: [name], MD   Brief Narrative:  HPI on 11/29/2018 by Dr. Fuller Plan Diane [name] is a 78 y.o. female with medical history significant of COPD on 2 L of nasal cannula oxygen with activity, diastolic CHF, A. fib on Eliquis, CAD, MVP, HTN, HLD, and lung cancer s/p chemo followed by [name]; who presents after having a fall at home.  Patient reports that she had just gotten back from the beauty shop and was coming up the steps.  She put her hand on the windowsill as she normally does to help get up the steps, but her hand slipped.  Patient fell backwards hitting her head on the concrete and twisting her left ankle.  She did not lose consciousness, but reported excruciating pain in swelling of the left ankle.  Her husband with whom she lives was unable to help her up and called EMS.  Patient reports that otherwise she has no complaints.  Denies fever, chills, chest pain,  Interim history Admitted with left ankle fracture, orthopedics consulted.  Plan for surgery on 12/06/2018. Also found to have AKI and placed on IVF.  Assessment & Plan   Fall with left ankle fracture -patient fell at home onto the concrete -X-rays revealed trimalleolar, distal tibia and fibular fractures -Patient underwent close reduction in the emergency department -Orthopedic surgery consulted and appreciated.  Dr. [name] initially recommended outpatient follow-up, however patient was admitted as she was unable to ambulate.  PT recommending SNF. -Surgery planned for 12/06/2018 as Eliquis will need to be held for 48 hours prior to surgical intervention. -Continue pain control  Acute kidney injury on chronic kidney disease, stage IV -Creatinine peaked to 3.19, however appears to be improving, currently 1.68 (baseline around 2) -Renal ultrasound showed no acute findings or hydronephrosis -UA unremarkable for  infection -Continue to avoid nephrotoxic agents -placed on IVF  -Monitor BMP  Anemia, likely of chronic disease/ Iron def -Hemoglobin currently 7.6 (upon review of patient's chart, her baseline hemoglobin ranges from 8-10) -Upon admission, patient did have a hemoglobin of 11.5 however drop may be dilutional as patient was receiving IV fluid -No active signs of bleeding- however, given that patient will have surgery tomorrow, will recheck hemoglobin this afternoon, if still in the 7 range, will transfuse -Continue to monitor CBC -anemia panel on 12/01/2018 showed iron 16, will give dose of Feraheme (oral supplementation held given constipation)  Low-grade fever -Patient with T-max of 100.1 F, no recurrent temperature -Possibly reactive to the above  Thrombocytopenia -Resolved, transient-unknown etiology -Continue to monitor CBC  Persistent atrial fibrillation -CHADSVASC score 6 -Patient on Eliquis, will be held after this evening's dose for surgery -Continue Pacerone  Chronic diastolic heart failure -Patient appears to be compensated and euvolemic at this time -Echocardiogram 12/12/2017 showed an EF of 65 to 70% -Given acute kidney injury, torsemide held -Continue to monitor intake and output, daily weights  COPD -Without acute exacerbation -Patient uses home oxygen of 2 L with activity -Continue home medications as well as nebulizer treatments as needed  Adenocarcinoma of the lung -Followed by oncology, [name]  Essential hypertension -Continue hydralazine, Coreg  Hypokalemia -Resolved with replacement -Continue to monitor BMP  Left upper extremity swelling -Seems to be improving.  Patient states that she may have fallen but does not remember.  She is able to move her wrist without eliciting pain.  Patient also received IV fluid. -Stable,  Continue to monitor closely  Constipation -Continue bowel regimen, iron held  Morbid obesity -BMI 41.6 -Patient will need  to discuss lifestyle modifications including diet exercise etc. with PCP on discharge  DVT Prophylaxis Eliquis  Code Status: Full  Family Communication: None at bedside  Disposition Plan: Admitted.  Will likely be discharged to SNF however pending surgery on 12/06/2018  Consultants Orthopedic surgery  Procedures  Renal ultrasound  Antibiotics   Anti-infectives (From admission, onward)   None      Subjective:   [facility] seen and examined today.  Patient has no complaints today.  Denies current chest pain, shortness of breath, domino pain, nausea or vomiting, diarrhea constipation, dizziness or headache.  Patient extremely concerned about trying to get up on her foot after surgery.   Objective:   Vitals:   12/04/18 2114 12/05/18 0504 12/05/18 0822 12/05/18 0828  BP: (!) 141/57 (!) 147/61 134/61   Pulse: 79 81 79   Resp: (!) 24 (!) 24 20   Temp: 98.6 F (37 C) 98.7 F (37.1 C) 98.1 F (36.7 C)   TempSrc: Oral Oral    SpO2: 100% 100% 99% 99%  Weight:      Height:        Intake/Output Summary (Last 24 hours) at 12/05/2018 1252 Last data filed at 12/05/2018 0900 Gross per 24 hour  Intake 980 ml  Output 1000 ml  Net -20 ml   Filed Weights   11/29/18 1436  Weight: 113.4 kg   Exam  General: Well developed, well nourished, NAD, appears stated age  HEENT: NCAT, mucous membranes moist.   Neck: Supple  Cardiovascular: S1 S2 auscultated, no rubs, murmurs or gallops. Regular rate and rhythm.  Respiratory: Clear to auscultation bilaterally with equal chest rise  Abdomen: Soft, nontender, nondistended, + bowel sounds  Extremities: warm dry without cyanosis clubbing. RLE +1 edema, LLE in splint. LUE edema improving  Neuro: AAOx3, nonfocal  Psych: Approriate mood and affect  Data Reviewed: I have personally reviewed following labs and imaging studies  CBC: Recent Labs  Lab 11/30/18 0357 12/01/18 0332 12/01/18 1539 12/02/18 0408 12/03/18 0344  12/04/18 0339 12/05/18 0404  WBC 6.8 5.5  --  6.2 5.8 6.2  --   HGB 9.6* 8.1* 8.2* 8.2* 7.9* 8.1* 7.6*  HCT 30.2* 26.2* 26.2* 26.7* 25.3* 25.5* 24.7*  MCV 97.4 98.1  --  99.6 98.8 99.2  --   PLT 173 128*  --  146* 149* 166  --    Basic Metabolic Panel: Recent Labs  Lab 12/01/18 0332 12/02/18 0408 12/03/18 0344 12/04/18 0339 12/05/18 0404  NA 135 136 137 136 137  K 3.2* 4.0 3.9 3.8 3.8  CL 100 103 103 105 104  CO2 27 27 27 27 27   GLUCOSE 107* 124* 107* 101* 109*  BUN 40* 36* 27* 23 23  CREATININE 2.85* 2.46* 2.02* 1.79* 1.68*  CALCIUM 9.2 9.2 9.5 9.7 9.6  MG 2.3 2.3 2.2 2.4  --    GFR: Estimated Creatinine Clearance: 34.7 mL/min (A) (by C-G formula based on SCr of 1.68 mg/dL (H)). Liver Function Tests: Recent Labs  Lab 11/29/18 1506 12/02/18 0408 12/03/18 0344  AST 20 14* 13*  ALT 15 11 10   ALKPHOS 44 34* 30*  BILITOT 0.8 0.6 0.9  PROT 7.0 6.1* 5.8*  ALBUMIN 3.3* 2.6* 2.3*   No results for input(s): LIPASE, AMYLASE in the last 168 hours. No results for input(s): AMMONIA in the last 168 hours. Coagulation Profile: Recent Labs  Lab 11/29/18 1506  INR 1.44   Cardiac Enzymes: No results for input(s): CKTOTAL, CKMB, CKMBINDEX, TROPONINI in the last 168 hours. BNP (last 3 results) No results for input(s): PROBNP in the last 8760 hours. HbA1C: No results for input(s): HGBA1C in the last 72 hours. CBG: No results for input(s): GLUCAP in the last 168 hours. Lipid Profile: No results for input(s): CHOL, HDL, LDLCALC, TRIG, CHOLHDL, LDLDIRECT in the last 72 hours. Thyroid Function Tests: No results for input(s): TSH, T4TOTAL, FREET4, T3FREE, THYROIDAB in the last 72 hours. Anemia Panel: No results for input(s): VITAMINB12, FOLATE, FERRITIN, TIBC, IRON, RETICCTPCT in the last 72 hours. Urine analysis:    Component Value Date/Time   COLORURINE YELLOW 11/30/2018 1835   APPEARANCEUR HAZY (A) 11/30/2018 1835   LABSPEC 1.012 11/30/2018 1835   PHURINE 5.0 11/30/2018  1835   GLUCOSEU NEGATIVE 11/30/2018 1835   HGBUR NEGATIVE 11/30/2018 1835   BILIRUBINUR NEGATIVE 11/30/2018 1835   KETONESUR NEGATIVE 11/30/2018 1835   PROTEINUR NEGATIVE 11/30/2018 1835   UROBILINOGEN 1.0 12/05/2009 1303   NITRITE NEGATIVE 11/30/2018 [place] 11/30/2018 1835   Sepsis Labs: @LABRCNTIP (procalcitonin:4,lacticidven:4)  ) Recent Results (from the past 240 hour(s))  Surgical PCR screen     Status: None   Collection Time: 12/04/18  1:04 PM  Result Value Ref Range Status   MRSA, PCR NEGATIVE NEGATIVE Final   Staphylococcus aureus NEGATIVE NEGATIVE Final    Comment: (NOTE) The Xpert SA Assay (FDA approved for NASAL specimens in patients 58 years of age and older), is one component of a comprehensive surveillance program. It is not intended to diagnose infection nor to guide or monitor treatment. Performed at [facility], [place] [address] [place], [place] [address]       Radiology Studies: No results found.   Scheduled Meds: . acetaminophen  650 mg Oral Q6H  . amiodarone  200 mg Oral Daily  . calcitRIOL  0.5 mcg Oral Daily  . carvedilol  25 mg Oral BID WC  . hydrALAZINE  25 mg Oral BID  . Melatonin  9 mg Oral QHS  . mupirocin ointment  1 application Nasal BID  . polyethylene glycol  17 g Oral BID  . senna  2 tablet Oral QHS  . umeclidinium-vilanterol  1 puff Inhalation Daily   Continuous Infusions: . ferumoxytol       LOS: 3 days   Time Spent in minutes   30 minutes  Carlton Buskey D.O. on 12/05/2018 at 12:52 PM  Between 7am to 7pm - Please see pager noted on [email or web address]  After 7pm go to [email or web address]  And look for the night coverage person covering for me after hours  [facility] Office  [phone]

## 2018-12-05 NOTE — Progress Notes (Signed)
Physical Therapy Treatment Patient Details Name: Diane [name] MRN: [id number] DOB: [birth year] Today's Date: 12/05/2018    History of Present Illness Pt is a 78 y.o. female with medical history significant for COPD on 2 L of nasal cannula oxygen with activity, diastolic CHF, A. fib on Eliquis, CAD, MVP, HTN, HLD, and lung cancer s/p chemo.  She presented to the ED following a fall at home. Xray revealed L ankle fx. Surgical repair is tentatively scheduled for Friday, 12/06/18, to allow for soft tissue rest and swelling resolution.     PT Comments    Patient seen for mobility progress. Use of slide board for lateral transfer due to noted inability to maintain WB status. Patient requiring Max A +2 for lateral transfer with consistent cueing for safety and sequencing. Will likely need re-eval after planned surgical intervention tomorrow? PT to continue to follow.    Follow Up Recommendations  SNF;Supervision/Assistance - 24 hour     Equipment Recommendations  Wheelchair (measurements PT)    Recommendations for Other Services       Precautions / Restrictions Precautions Precautions: Fall Restrictions Weight Bearing Restrictions: Yes LLE Weight Bearing: Non weight bearing    Mobility  Bed Mobility Overal bed mobility: Needs Assistance Bed Mobility: Supine to Sit     Supine to sit: Mod assist;+2 for physical assistance     General bed mobility comments: Mod A +2 for bed mobility; physical assist for trunk righting and L LE  Transfers Overall transfer level: Needs assistance Equipment used: Sliding board Transfers: Lateral/Scoot Transfers          Lateral/Scoot Transfers: Max assist;+2 physical assistance General transfer comment: Max A+2 for slide board transfer - consistent cueing for safety and sequencing  Ambulation/Gait                 Stairs             Wheelchair Mobility    Modified Rankin (Stroke Patients Only)       Balance Overall  balance assessment: Needs assistance Sitting-balance support: No upper extremity supported;Feet supported Sitting balance-Leahy Scale: Good Sitting balance - Comments: able to sit EOB x 4 min with SUP                                    Cognition Arousal/Alertness: Awake/alert Behavior During Therapy: WFL for tasks assessed/performed Overall Cognitive Status: Within Functional Limits for tasks assessed                                        Exercises      General Comments        Pertinent Vitals/Pain Pain Assessment: Faces Faces Pain Scale: Hurts even more Pain Location: LLE Pain Descriptors / Indicators: Aching;Grimacing;Guarding Pain Intervention(s): Limited activity within patient's tolerance;Monitored during session;Repositioned    Home Living                      Prior Function            PT Goals (current goals can now be found in the care plan section) Acute Rehab PT Goals Patient Stated Goal: home when able to walk PT Goal Formulation: With patient Time For Goal Achievement: 12/14/18 Potential to Achieve Goals: Good Progress towards PT goals: Progressing toward goals  Frequency    Min 3X/week      PT Plan Current plan remains appropriate    Co-evaluation              AM-PAC PT "6 Clicks" Mobility   Outcome Measure  Help needed turning from your back to your side while in a flat bed without using bedrails?: A Little Help needed moving from lying on your back to sitting on the side of a flat bed without using bedrails?: A Lot Help needed moving to and from a bed to a chair (including a wheelchair)?: A Lot Help needed standing up from a chair using your arms (e.g., wheelchair or bedside chair)?: Total Help needed to walk in hospital room?: Total Help needed climbing 3-5 steps with a railing? : Total 6 Click Score: 10    End of Session Equipment Utilized During Treatment: Gait belt;Oxygen Activity  Tolerance: Patient tolerated treatment well Patient left: in chair;with call bell/phone within reach;with chair alarm set Nurse Communication: Mobility status;Need for lift equipment PT Visit Diagnosis: Other abnormalities of gait and mobility (R26.89);Pain;Muscle weakness (generalized) (M62.81) Pain - Right/Left: Left Pain - part of body: Ankle and joints of foot     Time: 0413-6438 PT Time Calculation (min) (ACUTE ONLY): 29 min  Charges:  $Therapeutic Activity: 23-37 mins                      Lanney Gins, PT, DPT Supplemental Physical Therapist 12/05/18 3:51 PM Pager: [phone] Office: [phone]

## 2018-12-06 ENCOUNTER — Encounter (HOSPITAL_COMMUNITY)
Admission: EM | Disposition: A | Discharge status: Skilled Nursing Facility | Payer: Self-pay | Source: Home / Self Care | Attending: Internal Medicine

## 2018-12-06 ENCOUNTER — Inpatient Hospital Stay (HOSPITAL_COMMUNITY): Payer: Medicare Other | Admitting: Anesthesiology

## 2018-12-06 ENCOUNTER — Inpatient Hospital Stay (HOSPITAL_COMMUNITY): Payer: Medicare Other

## 2018-12-06 ENCOUNTER — Encounter (HOSPITAL_COMMUNITY): Payer: Self-pay | Admitting: Orthopedic Surgery

## 2018-12-06 HISTORY — PX: ORIF ANKLE FRACTURE: SHX5408

## 2018-12-06 LAB — BASIC METABOLIC PANEL
Anion gap: 5 (ref 5–15)
BUN: 23 mg/dL (ref 8–23)
CO2: 26 mmol/L (ref 22–32)
Calcium: 10.1 mg/dL (ref 8.9–10.3)
Chloride: 104 mmol/L (ref 98–111)
Creatinine, Ser: 1.7 mg/dL — ABNORMAL HIGH (ref 0.44–1.00)
GFR calc Af Amer: 33 mL/min — ABNORMAL LOW (ref >60)
GFR, EST NON AFRICAN AMERICAN: 28 mL/min — AB (ref >60)
GLUCOSE: 107 mg/dL — AB (ref 70–99)
POTASSIUM: 3.7 mmol/L (ref 3.5–5.1)
Sodium: 135 mmol/L (ref 135–145)

## 2018-12-06 LAB — CBC
HCT: 25.7 % — ABNORMAL LOW (ref 36.0–46.0)
Hemoglobin: 7.7 g/dL — ABNORMAL LOW (ref 12.0–15.0)
MCH: 30.2 pg (ref 26.0–34.0)
MCHC: 30 g/dL (ref 30.0–36.0)
MCV: 100.8 fL — ABNORMAL HIGH (ref 80.0–100.0)
Platelets: 176 10*3/uL (ref 150–400)
RBC: 2.55 MIL/uL — ABNORMAL LOW (ref 3.87–5.11)
RDW: 13.9 % (ref 11.5–15.5)
WBC: 5.4 10*3/uL (ref 4.0–10.5)
nRBC: 0 % (ref 0.0–0.2)

## 2018-12-06 LAB — PREPARE RBC (CROSSMATCH)

## 2018-12-06 SURGERY — OPEN REDUCTION INTERNAL FIXATION (ORIF) ANKLE FRACTURE
Anesthesia: General | Site: Ankle | Laterality: Left

## 2018-12-06 MED ORDER — MIDAZOLAM HCL 2 MG/2ML IJ SOLN
INTRAMUSCULAR | Status: AC
  Administered 2018-12-06: 1 mg via INTRAVENOUS
  Filled 2018-12-06: qty 2

## 2018-12-06 MED ORDER — FENTANYL CITRATE (PF) 100 MCG/2ML IJ SOLN
INTRAMUSCULAR | Status: DC | PRN
  Administered 2018-12-06 (×2): 12.5 ug via INTRAVENOUS
  Administered 2018-12-06: 25 ug via INTRAVENOUS

## 2018-12-06 MED ORDER — LIDOCAINE 2% (20 MG/ML) 5 ML SYRINGE
INTRAMUSCULAR | Status: AC
  Filled 2018-12-06: qty 5

## 2018-12-06 MED ORDER — ONDANSETRON HCL 4 MG/2ML IJ SOLN: 4.0000 mg | Freq: Four times a day (QID) | INTRAMUSCULAR | Status: DC | PRN

## 2018-12-06 MED ORDER — LIDOCAINE 2% (20 MG/ML) 5 ML SYRINGE
INTRAMUSCULAR | Status: DC | PRN
  Administered 2018-12-06: 40 mg via INTRAVENOUS

## 2018-12-06 MED ORDER — ONDANSETRON HCL 4 MG/2ML IJ SOLN
INTRAMUSCULAR | Status: AC
  Filled 2018-12-06: qty 2

## 2018-12-06 MED ORDER — DEXAMETHASONE SODIUM PHOSPHATE 10 MG/ML IJ SOLN
INTRAMUSCULAR | Status: AC
  Filled 2018-12-06: qty 1

## 2018-12-06 MED ORDER — DOCUSATE SODIUM 100 MG PO CAPS
100.0000 mg | ORAL_CAPSULE | Freq: Two times a day (BID) | ORAL | Status: DC
  Administered 2018-12-06 – 2018-12-11 (×9): 100 mg via ORAL
  Filled 2018-12-06 (×9): qty 1

## 2018-12-06 MED ORDER — FENTANYL CITRATE (PF) 250 MCG/5ML IJ SOLN
INTRAMUSCULAR | Status: AC
  Filled 2018-12-06: qty 5

## 2018-12-06 MED ORDER — PHENYLEPHRINE HCL 10 MG/ML IJ SOLN
INTRAMUSCULAR | Status: DC | PRN
  Administered 2018-12-06: 25 ug/min via INTRAVENOUS

## 2018-12-06 MED ORDER — ONDANSETRON HCL 4 MG/2ML IJ SOLN
INTRAMUSCULAR | Status: DC | PRN
  Administered 2018-12-06 (×2): 4 mg via INTRAVENOUS

## 2018-12-06 MED ORDER — SODIUM CHLORIDE 0.9% IV SOLUTION: Freq: Once | INTRAVENOUS | Status: DC

## 2018-12-06 MED ORDER — FENTANYL CITRATE (PF) 100 MCG/2ML IJ SOLN
INTRAMUSCULAR | Status: AC
  Administered 2018-12-06: 50 ug via INTRAVENOUS
  Filled 2018-12-06: qty 2

## 2018-12-06 MED ORDER — METOCLOPRAMIDE HCL 5 MG PO TABS: 5.0000 mg | ORAL_TABLET | Freq: Three times a day (TID) | ORAL | Status: DC | PRN

## 2018-12-06 MED ORDER — CEFAZOLIN SODIUM-DEXTROSE 2-4 GM/100ML-% IV SOLN
INTRAVENOUS | Status: AC
  Filled 2018-12-06: qty 100

## 2018-12-06 MED ORDER — ONDANSETRON HCL 4 MG PO TABS: 4.0000 mg | ORAL_TABLET | Freq: Four times a day (QID) | ORAL | Status: DC | PRN

## 2018-12-06 MED ORDER — PROPOFOL 10 MG/ML IV BOLUS
INTRAVENOUS | Status: AC
  Filled 2018-12-06: qty 20

## 2018-12-06 MED ORDER — MIDAZOLAM HCL 2 MG/2ML IJ SOLN
1.0000 mg | Freq: Once | INTRAMUSCULAR | Status: AC
  Administered 2018-12-06: 1 mg via INTRAVENOUS

## 2018-12-06 MED ORDER — LACTATED RINGERS IV SOLN
INTRAVENOUS | Status: DC
  Administered 2018-12-06 (×2): via INTRAVENOUS

## 2018-12-06 MED ORDER — CEFAZOLIN SODIUM 1 G IJ SOLR
INTRAMUSCULAR | Status: AC
  Filled 2018-12-06: qty 10

## 2018-12-06 MED ORDER — CEFAZOLIN SODIUM-DEXTROSE 2-4 GM/100ML-% IV SOLN
2.0000 g | Freq: Four times a day (QID) | INTRAVENOUS | Status: AC
  Administered 2018-12-06 – 2018-12-07 (×3): 2 g via INTRAVENOUS
  Filled 2018-12-06 (×3): qty 100

## 2018-12-06 MED ORDER — DEXAMETHASONE SODIUM PHOSPHATE 10 MG/ML IJ SOLN
INTRAMUSCULAR | Status: DC | PRN
  Administered 2018-12-06: 4 mg via INTRAVENOUS

## 2018-12-06 MED ORDER — FENTANYL CITRATE (PF) 100 MCG/2ML IJ SOLN
50.0000 ug | Freq: Once | INTRAMUSCULAR | Status: AC
  Administered 2018-12-06: 50 ug via INTRAVENOUS

## 2018-12-06 MED ORDER — 0.9 % SODIUM CHLORIDE (POUR BTL) OPTIME
TOPICAL | Status: DC | PRN
  Administered 2018-12-06: 1000 mL

## 2018-12-06 MED ORDER — PROPOFOL 10 MG/ML IV BOLUS
INTRAVENOUS | Status: DC | PRN
  Administered 2018-12-06: 150 mg via INTRAVENOUS

## 2018-12-06 MED ORDER — METOCLOPRAMIDE HCL 5 MG/ML IJ SOLN: 5.0000 mg | Freq: Three times a day (TID) | INTRAMUSCULAR | Status: DC | PRN

## 2018-12-06 MED ORDER — CEFAZOLIN SODIUM-DEXTROSE 2-4 GM/100ML-% IV SOLN
2.0000 g | INTRAVENOUS | Status: AC
  Administered 2018-12-06: 3 g via INTRAVENOUS

## 2018-12-06 SURGICAL SUPPLY — 70 items
ALCOHOL 70% 16 OZ (MISCELLANEOUS) ×3 IMPLANT
BANDAGE ACE 4X5 VEL STRL LF (GAUZE/BANDAGES/DRESSINGS) ×2 IMPLANT
BANDAGE ACE 6X5 VEL STRL LF (GAUZE/BANDAGES/DRESSINGS) IMPLANT
BANDAGE ESMARK 6X9 LF (GAUZE/BANDAGES/DRESSINGS) IMPLANT
BIT DRILL 2 CANN GRADUATED (BIT) ×2 IMPLANT
BIT DRILL 2.5 CANN ENDOSCOPIC (BIT) ×2 IMPLANT
BIT DRILL 2.5 CANN STRL (BIT) ×2 IMPLANT
BIT DRILL 2.7 (BIT) ×2
BIT DRILL 2.7X2.7/3XSCR ANKL (BIT) IMPLANT
BIT DRL 2.7X2.7/3XSCR ANKL (BIT) ×1
BNDG COHESIVE 4X5 TAN STRL (GAUZE/BANDAGES/DRESSINGS) ×5 IMPLANT
BNDG ELASTIC 6X15 VLCR STRL LF (GAUZE/BANDAGES/DRESSINGS) ×2 IMPLANT
BNDG ESMARK 6X9 LF (GAUZE/BANDAGES/DRESSINGS)
CANISTER SUCT 3000ML PPV (MISCELLANEOUS) ×3 IMPLANT
COVER SURGICAL LIGHT HANDLE (MISCELLANEOUS) ×5 IMPLANT
COVER WAND RF STERILE (DRAPES) ×3 IMPLANT
CUFF TOURNIQUET SINGLE 34IN LL (TOURNIQUET CUFF) IMPLANT
CUFF TOURNIQUET SINGLE 44IN (TOURNIQUET CUFF) ×2 IMPLANT
DRAPE C-ARM 42X72 X-RAY (DRAPES) ×3 IMPLANT
DRAPE C-ARMOR (DRAPES) ×3 IMPLANT
DRAPE U-SHAPE 47X51 STRL (DRAPES) ×3 IMPLANT
DRSG ADAPTIC 3X8 NADH LF (GAUZE/BANDAGES/DRESSINGS) ×3 IMPLANT
DRSG PAD ABDOMINAL 8X10 ST (GAUZE/BANDAGES/DRESSINGS) ×3 IMPLANT
DURAPREP 26ML APPLICATOR (WOUND CARE) ×3 IMPLANT
ELECT REM PT RETURN 9FT ADLT (ELECTROSURGICAL) ×3
ELECTRODE REM PT RTRN 9FT ADLT (ELECTROSURGICAL) ×1 IMPLANT
GAUZE SPONGE 4X4 12PLY STRL (GAUZE/BANDAGES/DRESSINGS) ×3 IMPLANT
GAUZE XEROFORM 5X9 LF (GAUZE/BANDAGES/DRESSINGS) ×2 IMPLANT
GLOVE BIO SURGEON STRL SZ7.5 (GLOVE) ×3 IMPLANT
GLOVE BIOGEL PI IND STRL 8 (GLOVE) ×1 IMPLANT
GLOVE BIOGEL PI INDICATOR 8 (GLOVE) ×2
GOWN STRL REUS W/ TWL LRG LVL3 (GOWN DISPOSABLE) ×2 IMPLANT
GOWN STRL REUS W/ TWL XL LVL3 (GOWN DISPOSABLE) ×1 IMPLANT
GOWN STRL REUS W/TWL LRG LVL3 (GOWN DISPOSABLE) ×4
GOWN STRL REUS W/TWL XL LVL3 (GOWN DISPOSABLE) ×2
GUIDEWIRE 1.35MM (WIRE) ×4 IMPLANT
KIT BASIN OR (CUSTOM PROCEDURE TRAY) ×3 IMPLANT
KIT TURNOVER KIT B (KITS) ×3 IMPLANT
NDL HYPO 25GX1X1/2 BEV (NEEDLE) IMPLANT
NEEDLE HYPO 25GX1X1/2 BEV (NEEDLE) IMPLANT
NS IRRIG 1000ML POUR BTL (IV SOLUTION) ×3 IMPLANT
PACK ORTHO EXTREMITY (CUSTOM PROCEDURE TRAY) ×3 IMPLANT
PAD ABD 8X10 STRL (GAUZE/BANDAGES/DRESSINGS) ×4 IMPLANT
PAD ARMBOARD 7.5X6 YLW CONV (MISCELLANEOUS) ×6 IMPLANT
PAD CAST 4YDX4 CTTN HI CHSV (CAST SUPPLIES) ×2 IMPLANT
PADDING CAST COTTON 4X4 STRL (CAST SUPPLIES) ×2
PADDING CAST COTTON 6X4 STRL (CAST SUPPLIES) ×5 IMPLANT
PLATE LOCK DIST FIB 5H LT SS (Plate) ×2 IMPLANT
SCREW CORTEX 2.7X24MM LP SS (Screw) ×2 IMPLANT
SCREW LOCKING 2.7X12 ANKLE (Screw) ×4 IMPLANT
SCREW LOCKING 2.7X14MM (Screw) ×2 IMPLANT
SCREW LOCKING 2.7X16MM (Screw) ×2 IMPLANT
SCREW LOW PRO THRD SH 4.0X50 (Screw) ×4 IMPLANT
SCREW LOW PROFILE 3.5X14 (Screw) ×4 IMPLANT
SCREW LOW PROFILE 3.5X16 (Screw) ×2 IMPLANT
SPLINT PLASTER CAST XFAST 5X30 (CAST SUPPLIES) IMPLANT
SPLINT PLASTER XFAST SET 5X30 (CAST SUPPLIES) ×2
SPONGE LAP 18X18 X RAY DECT (DISPOSABLE) IMPLANT
SUCTION FRAZIER HANDLE 10FR (MISCELLANEOUS) ×2
SUCTION TUBE FRAZIER 10FR DISP (MISCELLANEOUS) ×1 IMPLANT
SUT ETHILON 3 0 PS 1 (SUTURE) ×3 IMPLANT
SUT VIC AB 0 CT1 27 (SUTURE)
SUT VIC AB 0 CT1 27XBRD ANBCTR (SUTURE) IMPLANT
SUT VIC AB 2-0 CT1 27 (SUTURE) ×2
SUT VIC AB 2-0 CT1 TAPERPNT 27 (SUTURE) ×1 IMPLANT
SYR CONTROL 10ML LL (SYRINGE) IMPLANT
TOWEL OR 17X26 10 PK STRL BLUE (TOWEL DISPOSABLE) ×6 IMPLANT
TUBE CONNECTING 12'X1/4 (SUCTIONS) ×1
TUBE CONNECTING 12X1/4 (SUCTIONS) ×2 IMPLANT
YANKAUER SUCT BULB TIP NO VENT (SUCTIONS) ×3 IMPLANT

## 2018-12-06 NOTE — Plan of Care (Signed)
  Problem: Clinical Measurements: Goal: Ability to maintain clinical measurements within normal limits will improve Outcome: Progressing   Problem: Pain Managment: Goal: General experience of comfort will improve Outcome: Progressing   

## 2018-12-06 NOTE — Progress Notes (Signed)
PROGRESS NOTE    Diane [name] [facility]  BMW:[id number] DOB: December 26, 1940 DOA: 11/29/2018 PCP: [name], MD   Brief Narrative:  HPI on 11/29/2018 by Dr. Fuller Plan Diane [name] is a 78 y.o. female with medical history significant of COPD on 2 L of nasal cannula oxygen with activity, diastolic CHF, A. fib on Eliquis, CAD, MVP, HTN, HLD, and lung cancer s/p chemo followed by [name]; who presents after having a fall at home.  Patient reports that she had just gotten back from the beauty shop and was coming up the steps.  She put her hand on the windowsill as she normally does to help get up the steps, but her hand slipped.  Patient fell backwards hitting her head on the concrete and twisting her left ankle.  She did not lose consciousness, but reported excruciating pain in swelling of the left ankle.  Her husband with whom she lives was unable to help her up and called EMS.  Patient reports that otherwise she has no complaints.  Denies fever, chills, chest pain,  Interim history Admitted with left ankle fracture, orthopedics consulted.  Plan for surgery today 12/06/2018. Also found to have AKI and placed on IVF.  Assessment & Plan   Fall with left ankle fracture -patient fell at home onto the concrete -X-rays revealed trimalleolar, distal tibia and fibular fractures -Patient underwent close reduction in the emergency department -Orthopedic surgery consulted and appreciated.  Dr. [name] initially recommended outpatient follow-up, however patient was admitted as she was unable to ambulate.  PT recommending SNF. -Surgery planned for today, 12/06/2018 (Eliquis was held for 48 hours prior to surgical intervention) -Continue pain control  Acute kidney injury on chronic kidney disease, stage IV -Creatinine peaked to 3.19, however appears to be improving, currently 1.70 (baseline around 2) -Renal ultrasound showed no acute findings or hydronephrosis -UA unremarkable for infection -Continue  to avoid nephrotoxic agents -placed on IVF  -Monitor BMP  Anemia, likely of chronic disease/ Iron def -Hemoglobin currently 7.7 (upon review of patient's chart, her baseline hemoglobin ranges from 8-10) -Upon admission, patient did have a hemoglobin of 11.5 however drop may be dilutional as patient was receiving IV fluid -No active signs of bleeding -anemia panel on 12/01/2018 showed iron 16- given dose of Feraheme (oral supplementation held given constipation) -Given that surgery will occur today, 1uPRBC ordered -continue to monitor CBC  Low-grade fever -Patient with T-max of 100.1 F, no recurrent temperature -Possibly reactive to the above  Thrombocytopenia -Resolved, transient-unknown etiology -Continue to monitor CBC  Persistent atrial fibrillation -CHADSVASC score 6 -Patient on Eliquis, currently held for surgery -Continue Pacerone  Chronic diastolic heart failure -Patient appears to be compensated and euvolemic at this time -Echocardiogram 12/12/2017 showed an EF of 65 to 70% -Given acute kidney injury, torsemide held -Continue to monitor intake and output, daily weights  COPD -Without acute exacerbation -Patient uses home oxygen of 2 L with activity -Continue home medications as well as nebulizer treatments as needed  Adenocarcinoma of the lung -Followed by oncology, [name]  Essential hypertension -Continue hydralazine, Coreg  Hypokalemia -Resolved with replacement -Continue to monitor BMP  Left upper extremity swelling -Seems to be improving.  Patient states that she may have fallen but does not remember.  She is able to move her wrist without eliciting pain.  Patient also received IV fluid. -Stable, Continue to monitor closely  Constipation -Continue bowel regimen, iron held  Morbid obesity -BMI 41.6 -Patient will need to discuss lifestyle  modifications including diet exercise etc. with PCP on discharge  DVT Prophylaxis Eliquis- currently held for  sugery/ SCDs  Code Status: Full  Family Communication: None at bedside  Disposition Plan: Admitted.  Will likely be discharged to SNF however pending surgery today 12/06/2018  Consultants Orthopedic surgery  Procedures  Renal ultrasound  Antibiotics   Anti-infectives (From admission, onward)   Start     Dose/Rate Route Frequency Ordered Stop   12/06/18 1200  ceFAZolin (ANCEF) IVPB 2g/100 mL premix     2 g 200 mL/hr over 30 Minutes Intravenous On call to O.R. 12/06/18 1112 12/07/18 0559   12/06/18 1113  ceFAZolin (ANCEF) 2-4 GM/100ML-% IVPB    Note to Pharmacy:  [facility]   : cabinet override      12/06/18 1113 12/06/18 2314      Subjective:   Diane [name] seen and examined today.  Patient has no complaints today.  Currently denies chest pain, shortness of breath, abdominal pain, nausea or vomiting, diarrhea or constipation, dizziness or headache.  Objective:   Vitals:   12/05/18 2214 12/06/18 0450 12/06/18 0839 12/06/18 1129  BP: (!) 141/66 138/67    Pulse:  82    Resp:  18    Temp:  98.5 F (36.9 C)    TempSrc:  Oral    SpO2:  98% 97%   Weight:  133.3 kg  133.3 kg  Height:    5\' 5"  (1.651 m)    Intake/Output Summary (Last 24 hours) at 12/06/2018 1130 Last data filed at 12/06/2018 0654 Gross per 24 hour  Intake 360 ml  Output 800 ml  Net -440 ml   Filed Weights   11/29/18 1436 12/06/18 0450 12/06/18 1129  Weight: 113.4 kg 133.3 kg 133.3 kg   Exam  General: Well developed, well nourished, NAD, elderly  HEENT: NCAT, mucous membranes moist.   Neck: Supple  Cardiovascular: S1 S2 auscultated, no murmurs, RRR  Respiratory: Clear to auscultation bilaterally with equal chest rise  Abdomen: Soft, obese, nontender, nondistended, + bowel sounds  Extremities: warm dry without cyanosis clubbing.  RLE +1 edema, LLE in splint.  LUE edema improved  Neuro: AAOx3, nonfocal  Psych: Flat  Data Reviewed: I have personally reviewed following labs and  imaging studies  CBC: Recent Labs  Lab 12/01/18 0332  12/02/18 0408 12/03/18 0344 12/04/18 0339 12/05/18 0404 12/05/18 2030 12/06/18 0222  WBC 5.5  --  6.2 5.8 6.2  --   --  5.4  HGB 8.1*   < > 8.2* 7.9* 8.1* 7.6* 7.9* 7.7*  HCT 26.2*   < > 26.7* 25.3* 25.5* 24.7* 26.3* 25.7*  MCV 98.1  --  99.6 98.8 99.2  --   --  100.8*  PLT 128*  --  146* 149* 166  --   --  176   < > = values in this interval not displayed.   Basic Metabolic Panel: Recent Labs  Lab 12/01/18 0332 12/02/18 0408 12/03/18 0344 12/04/18 0339 12/05/18 0404 12/06/18 0222  NA 135 136 137 136 137 135  K 3.2* 4.0 3.9 3.8 3.8 3.7  CL 100 103 103 105 104 104  CO2 27 27 27 27 27 26   GLUCOSE 107* 124* 107* 101* 109* 107*  BUN 40* 36* 27* 23 23 23   CREATININE 2.85* 2.46* 2.02* 1.79* 1.68* 1.70*  CALCIUM 9.2 9.2 9.5 9.7 9.6 10.1  MG 2.3 2.3 2.2 2.4  --   --    GFR: Estimated Creatinine Clearance: 37.7 mL/min (A) (  by C-G formula based on SCr of 1.7 mg/dL (H)). Liver Function Tests: Recent Labs  Lab 11/29/18 1506 12/02/18 0408 12/03/18 0344  AST 20 14* 13*  ALT 15 11 10   ALKPHOS 44 34* 30*  BILITOT 0.8 0.6 0.9  PROT 7.0 6.1* 5.8*  ALBUMIN 3.3* 2.6* 2.3*   No results for input(s): LIPASE, AMYLASE in the last 168 hours. No results for input(s): AMMONIA in the last 168 hours. Coagulation Profile: Recent Labs  Lab 11/29/18 1506  INR 1.44   Cardiac Enzymes: No results for input(s): CKTOTAL, CKMB, CKMBINDEX, TROPONINI in the last 168 hours. BNP (last 3 results) No results for input(s): PROBNP in the last 8760 hours. HbA1C: No results for input(s): HGBA1C in the last 72 hours. CBG: No results for input(s): GLUCAP in the last 168 hours. Lipid Profile: No results for input(s): CHOL, HDL, LDLCALC, TRIG, CHOLHDL, LDLDIRECT in the last 72 hours. Thyroid Function Tests: No results for input(s): TSH, T4TOTAL, FREET4, T3FREE, THYROIDAB in the last 72 hours. Anemia Panel: No results for input(s):  VITAMINB12, FOLATE, FERRITIN, TIBC, IRON, RETICCTPCT in the last 72 hours. Urine analysis:    Component Value Date/Time   COLORURINE YELLOW 11/30/2018 1835   APPEARANCEUR HAZY (A) 11/30/2018 1835   LABSPEC 1.012 11/30/2018 1835   PHURINE 5.0 11/30/2018 1835   GLUCOSEU NEGATIVE 11/30/2018 1835   HGBUR NEGATIVE 11/30/2018 1835   BILIRUBINUR NEGATIVE 11/30/2018 1835   KETONESUR NEGATIVE 11/30/2018 1835   PROTEINUR NEGATIVE 11/30/2018 1835   UROBILINOGEN 1.0 12/05/2009 1303   NITRITE NEGATIVE 11/30/2018 [place] 11/30/2018 1835   Sepsis Labs: @LABRCNTIP (procalcitonin:4,lacticidven:4)  ) Recent Results (from the past 240 hour(s))  Surgical PCR screen     Status: None   Collection Time: 12/04/18  1:04 PM  Result Value Ref Range Status   MRSA, PCR NEGATIVE NEGATIVE Final   Staphylococcus aureus NEGATIVE NEGATIVE Final    Comment: (NOTE) The Xpert SA Assay (FDA approved for NASAL specimens in patients 59 years of age and older), is one component of a comprehensive surveillance program. It is not intended to diagnose infection nor to guide or monitor treatment. Performed at [facility], [place] [address], [place], [place] [address]       Radiology Studies: No results found.   Scheduled Meds: . [MAR Hold] sodium chloride   Intravenous Once  . [MAR Hold] acetaminophen  650 mg Oral Q6H  . [MAR Hold] amiodarone  200 mg Oral Daily  . [MAR Hold] calcitRIOL  0.5 mcg Oral Daily  . [MAR Hold] carvedilol  25 mg Oral BID WC  . chlorhexidine  60 mL Topical Once  . [MAR Hold] hydrALAZINE  25 mg Oral BID  . [MAR Hold] Melatonin  9 mg Oral QHS  . [MAR Hold] mupirocin ointment  1 application Nasal BID  . [MAR Hold] polyethylene glycol  17 g Oral BID  . povidone-iodine  2 application Topical Once  . [MAR Hold] senna  2 tablet Oral QHS  . [MAR Hold] umeclidinium-vilanterol  1 puff Inhalation Daily   Continuous Infusions: . ceFAZolin    .  ceFAZolin (ANCEF) IV        LOS: 4 days   Time Spent in minutes   30 minutes  Mariachristina Holle D.O. on 12/06/2018 at 11:30 AM  Between 7am to 7pm - Please see pager noted on [email or web address]  After 7pm go to [email or web address]  And look for the night coverage person covering for me after hours  [facility] Office  [phone]

## 2018-12-06 NOTE — Anesthesia Postprocedure Evaluation (Signed)
Anesthesia Post Note  Patient: Diane [name] [facility]  Procedure(s) Performed: OPEN REDUCTION INTERNAL FIXATION (ORIF) ANKLE FRACTURE (Left Ankle)     Patient location during evaluation: PACU Anesthesia Type: General Level of consciousness: awake and alert Pain management: pain level controlled Vital Signs Assessment: post-procedure vital signs reviewed and stable Respiratory status: spontaneous breathing, nonlabored ventilation, respiratory function stable and patient connected to nasal cannula oxygen Cardiovascular status: blood pressure returned to baseline and stable Postop Assessment: no apparent nausea or vomiting Anesthetic complications: no    Last Vitals:  Vitals:   12/06/18 1500 12/06/18 1516  BP: (!) 156/58 (!) 157/61  Pulse: 79 80  Resp: 20   Temp:  36.9 C  SpO2: 98% 97%    Last Pain:  Vitals:   12/06/18 1516  TempSrc: Oral  PainSc: 0-No pain                 Tiajuana Amass

## 2018-12-06 NOTE — Op Note (Signed)
Date of Surgery: 12/06/2018  INDICATIONS: Ms. Pontillo is a 78 y.o.-year-old female who sustained a left ankle fracture; she was indicated for open reduction and internal fixation due to the displaced nature of the articular fracture and came to the operating room today for this procedure. The patient did consent to the procedure after discussion of the risks and benefits.  PREOPERATIVE DIAGNOSIS: left trimalleolar ankle fracture  POSTOPERATIVE DIAGNOSIS: Same.  PROCEDURE: Open treatment of left ankle fracture with internal fixation.Trimalleolar w/o fixation of posterior malleolus CPT 27822.   SURGEON:  P. [name], M.D.  ASSIST: none.  ANESTHESIA:  General and regional  TOURNIQUET TIME: 62 min   IV FLUIDS AND URINE: See anesthesia.  ESTIMATED BLOOD LOSS: 5 mL.  IMPLANTS:  Arthrex distal fibula plate with 2.7 locking and 3.5 mm nonlocking screws Medial 4.0 mm cannulated screws x 2  COMPLICATIONS: None.  DESCRIPTION OF PROCEDURE: The patient was brought to the operating room and placed supine on the operating table.  The patient had been signed prior to the procedure and this was documented. The patient had the anesthesia placed by the anesthesiologist.  A nonsterile tourniquet was placed on the upper thigh.  The prep verification and incision time-outs were performed to confirm that this was the correct patient, site, side and location. The patient had an SCD on the opposite lower extremity. The patient did receive antibiotics prior to the incision and was re-dosed during the procedure as needed at indicated intervals.  The patient had the lower extremity prepped and draped in the standard surgical fashion.  The extremity was exsanguinated using an esmarch bandage and the tourniquet was inflated to 350 mm Hg.   Incision was made over the distal fibula and the fracture was exposed and reduced anatomically with a clamp. A lag screw was placed. I then applied a distal fibula locking  plate and secured it proximally and distally with non-locking screws. Bone quality was very poor. I used c-arm to confirm satisfactory reduction and fixation.   I then turned my attention to the medial malleolus. Due to fracture blisters overlying the medial malleolus we did not feel comfortable with incision for direct reduction.  With a percutaneous technique we were able to avoid the macerated medial side where the fracture blisters were.  Walking via indirect reduction techniques we placed 2 percutaneous 4.0 mm cannulated screws.  These did reduce the fracture but did not provide as much compression as an open reduction would.  However, the mortise was well aligned and we felt was acceptable given her baseline functional status and the condition of the bones.    The syndesmosis was stressed using live fluoroscopy and found to be stable.   The wounds were irrigated, and closed with vicryl with routine closure for the skin. The wounds were injected with local anesthetic. Sterile gauze was applied followed by a posterior splint. She was awakened and returned to the PACU in stable and satisfactory condition. There were no complications.  All counts were correct x2.  No intraoperative complications were noted.  POSTOPERATIVE PLAN: Ms. Chinchilla will remain nonweightbearing on this leg for approximately 6 weeks; Ms. Belleau will return for suture removal in 2 weeks.  He will be immobilized in a short leg splint and then transitioned to a cast at this first follow up appointment.  Ms. Semrad will receive DVT prophylaxis based on other medications, activity level, and risk ratio of bleeding to thrombosis.  Geralynn Rile, MD [facility] [phone] 2:49  PM   

## 2018-12-06 NOTE — Progress Notes (Signed)
PT Cancellation Note  Patient Details Name: Diane [name] MRN: [id number] DOB: [birth year]   Cancelled Treatment:    Reason Eval/Treat Not Completed: Patient at procedure or test/unavailable Transport taking patient to OR. Will follow up as time allows.    Lanney Gins, PT, DPT Supplemental Physical Therapist 12/06/18 11:16 AM Pager: [phone] Office: [phone]

## 2018-12-06 NOTE — Anesthesia Procedure Notes (Signed)
Procedure Name: LMA Insertion Date/Time: 12/06/2018 1:02 PM Performed by: Genelle Bal, CRNA Pre-anesthesia Checklist: Patient identified, Emergency Drugs available, Suction available and Patient being monitored Patient Re-evaluated:Patient Re-evaluated prior to induction Oxygen Delivery Method: Circle system utilized Preoxygenation: Pre-oxygenation with 100% oxygen Induction Type: IV induction Ventilation: Mask ventilation without difficulty LMA: LMA inserted LMA Size: 4.0 Number of attempts: 1 Airway Equipment and Method: Bite block Placement Confirmation: positive ETCO2 Tube secured with: Tape Dental Injury: Teeth and Oropharynx as per pre-operative assessment

## 2018-12-06 NOTE — Anesthesia Preprocedure Evaluation (Signed)
Anesthesia Evaluation  Patient identified by MRN, date of birth, ID band Patient awake    Reviewed: Allergy & Precautions, NPO status , Patient's Chart, lab work & pertinent test results  Airway Mallampati: II  TM Distance: >3 FB Neck ROM: Full    Dental  (+) Dental Advisory Given   Pulmonary COPD,  oxygen dependent, former smoker,    breath sounds clear to auscultation       Cardiovascular hypertension, Pt. on medications + CAD and +CHF  + Valvular Problems/Murmurs  Rhythm:Regular Rate:Normal     Neuro/Psych negative neurological ROS     GI/Hepatic Neg liver ROS, GERD  ,  Endo/Other  negative endocrine ROS  Renal/GU Renal disease     Musculoskeletal   Abdominal   Peds  Hematology  (+) anemia ,   Anesthesia Other Findings   Reproductive/Obstetrics                             Lab Results  Component Value Date   WBC 5.4 12/06/2018   HGB 7.7 (L) 12/06/2018   HCT 25.7 (L) 12/06/2018   MCV 100.8 (H) 12/06/2018   PLT 176 12/06/2018   Lab Results  Component Value Date   CREATININE 1.70 (H) 12/06/2018   BUN 23 12/06/2018   NA 135 12/06/2018   K 3.7 12/06/2018   CL 104 12/06/2018   CO2 26 12/06/2018    Anesthesia Physical Anesthesia Plan  ASA: IV  Anesthesia Plan: General   Post-op Pain Management:  Regional for Post-op pain   Induction: Intravenous  PONV Risk Score and Plan: 3 and Dexamethasone, Ondansetron and Treatment may vary due to age or medical condition  Airway Management Planned: LMA  Additional Equipment:   Intra-op Plan:   Post-operative Plan: Extubation in OR  Informed Consent: I have reviewed the patients History and Physical, chart, labs and discussed the procedure including the risks, benefits and alternatives for the proposed anesthesia with the patient or authorized representative who has indicated his/her understanding and acceptance.   Dental  advisory given  Plan Discussed with: CRNA  Anesthesia Plan Comments:         Anesthesia Quick Evaluation

## 2018-12-06 NOTE — Anesthesia Procedure Notes (Signed)
Anesthesia Regional Block: Femoral nerve block   Pre-Anesthetic Checklist: ,, timeout performed, Correct Patient, Correct Site, Correct Laterality, Correct Procedure, Correct Position, site marked, Risks and benefits discussed,  Surgical consent,  Pre-op evaluation,  At surgeon's request and post-op pain management  Laterality: Left  Prep: chloraprep       Needles:  Injection technique: Single-shot  Needle Type: Echogenic Needle     Needle Length: 9cm  Needle Gauge: 21     Additional Needles:   Procedures:, nerve stimulator,,, ultrasound used (permanent image in chart),,,,   Nerve Stimulator or Paresthesia:  Response: quad, 0.6 mA,   Additional Responses:   Narrative:  Start time: 12/06/2018 12:35 PM End time: 12/06/2018 12:43 PM Injection made incrementally with aspirations every 5 mL.  Performed by: Personally  Anesthesiologist: Suzette Battiest, MD

## 2018-12-06 NOTE — H&P (Signed)
H&P update  The surgical history has been reviewed and remains accurate without interval change.  The patient was re-examined and patient's physiologic condition has not changed significantly in the last 30 days. The condition still exists that makes this procedure necessary. The treatment plan remains the same, without new options for care.  No new pharmacological allergies or types of therapy has been initiated that would change the plan or the appropriateness of the plan.  The patient and/or family understand the potential benefits and risks.  She does have chronic anemia and is slightly below historic norms at 7.7 ( normal ranges 8-10), will proceed with surgery given chronicity and use tourniquet.  Transfusion ordered on floor this am, never given, likely can give post op.  Ketrina Boateng P. [name], MD 12/06/2018 12:26 PM

## 2018-12-06 NOTE — Progress Notes (Addendum)
1120 NPO post midnight maint. Pt signed consent for surgery this morning. CHG bath completed. Report was given to Manuela Schwartz in [place] stay. Pt to short stay. 1515 Received pt from PACU, A&O x4. Left lower leg with splint covered with compression wrap, dry and intact. Numbness noted due to a nerve block, unable to move left foot yet.

## 2018-12-06 NOTE — Anesthesia Procedure Notes (Signed)
Anesthesia Regional Block: Popliteal block   Pre-Anesthetic Checklist: ,, timeout performed, Correct Patient, Correct Site, Correct Laterality, Correct Procedure, Correct Position, site marked, Risks and benefits discussed,  Surgical consent,  Pre-op evaluation,  At surgeon's request and post-op pain management  Laterality: Left  Prep: chloraprep       Needles:  Injection technique: Single-shot  Needle Type: Echogenic Stimulator Needle     Needle Length: 9cm  Needle Gauge: 21     Additional Needles:   Procedures:, nerve stimulator,,, ultrasound used (permanent image in chart),,,,   Nerve Stimulator or Paresthesia:  Response: plantar flexion, 0.5 mA,   Additional Responses:   Narrative:  Start time: 12/06/2018 12:27 PM End time: 12/06/2018 12:34 PM Injection made incrementally with aspirations every 5 mL.  Performed by: Personally  Anesthesiologist: Suzette Battiest, MD

## 2018-12-06 NOTE — Transfer of Care (Signed)
Immediate Anesthesia Transfer of Care Note  Patient: Diane [name] [facility]  Procedure(s) Performed: OPEN REDUCTION INTERNAL FIXATION (ORIF) ANKLE FRACTURE (Left Ankle)  Patient Location: PACU  Anesthesia Type:GA combined with regional for post-op pain  Level of Consciousness: awake, alert  and oriented  Airway & Oxygen Therapy: Patient Spontanous Breathing and Patient connected to face mask oxygen  Post-op Assessment: Report given to RN and Post -op Vital signs reviewed and stable  Post vital signs: Reviewed and stable  Last Vitals:  Vitals Value Taken Time  BP 143/51 12/06/2018  2:37 PM  Temp    Pulse 80 12/06/2018  2:37 PM  Resp 27 12/06/2018  2:37 PM  SpO2 95 % 12/06/2018  2:37 PM  Vitals shown include unvalidated device data.  Last Pain:  Vitals:   12/06/18 0450  TempSrc: Oral  PainSc:       Patients Stated Pain Goal: 3 (05/20/75 2831)  Complications: No apparent anesthesia complications

## 2018-12-06 NOTE — Brief Op Note (Signed)
12/06/2018  2:48 PM  PATIENT:  Diane [name]  78 y.o. female  PRE-OPERATIVE DIAGNOSIS:  LEFT ANKLE TRIMALLEOLAR FRACTURE  POST-OPERATIVE DIAGNOSIS:  LEFT ANKLE TRIMALLEOLAR FRACTURE  PROCEDURE:  Procedure(s): OPEN REDUCTION INTERNAL FIXATION (ORIF) ANKLE FRACTURE (Left)  SURGEON:  Surgeon(s) and Role:    * Nicholes Stairs, MD - Primary  PHYSICIAN ASSISTANT:   ASSISTANTS: none   ANESTHESIA:   regional and general  EBL:  1 mL   BLOOD ADMINISTERED:none  DRAINS: none   LOCAL MEDICATIONS USED:  NONE  SPECIMEN:  No Specimen  DISPOSITION OF SPECIMEN:  N/A  COUNTS:  YES  TOURNIQUET:   Total Tourniquet Time Documented: Thigh (Left) - 62 minutes Total: Thigh (Left) - 62 minutes   DICTATION: .Note written in EPIC  PLAN OF CARE: return to inpatient  PATIENT DISPOSITION:  PACU - hemodynamically stable.   Delay start of Pharmacological VTE agent (>24hrs) due to surgical blood loss or risk of bleeding: not applicable

## 2018-12-07 LAB — HEMOGLOBIN AND HEMATOCRIT, BLOOD
HCT: 26.1 % — ABNORMAL LOW (ref 36.0–46.0)
HEMOGLOBIN: 8.2 g/dL — AB (ref 12.0–15.0)

## 2018-12-07 LAB — BASIC METABOLIC PANEL
Anion gap: 6 (ref 5–15)
BUN: 24 mg/dL — AB (ref 8–23)
CO2: 26 mmol/L (ref 22–32)
Calcium: 10 mg/dL (ref 8.9–10.3)
Chloride: 103 mmol/L (ref 98–111)
Creatinine, Ser: 1.6 mg/dL — ABNORMAL HIGH (ref 0.44–1.00)
GFR calc Af Amer: 35 mL/min — ABNORMAL LOW (ref >60)
GFR, EST NON AFRICAN AMERICAN: 31 mL/min — AB (ref >60)
Glucose, Bld: 127 mg/dL — ABNORMAL HIGH (ref 70–99)
Potassium: 4.6 mmol/L (ref 3.5–5.1)
Sodium: 135 mmol/L (ref 135–145)

## 2018-12-07 MED ORDER — TORSEMIDE 20 MG PO TABS
40.0000 mg | ORAL_TABLET | Freq: Two times a day (BID) | ORAL | Status: DC
  Administered 2018-12-07 – 2018-12-11 (×9): 40 mg via ORAL
  Filled 2018-12-07 (×8): qty 2

## 2018-12-07 MED ORDER — APIXABAN 5 MG PO TABS
5.0000 mg | ORAL_TABLET | Freq: Two times a day (BID) | ORAL | Status: DC
  Administered 2018-12-07 – 2018-12-11 (×9): 5 mg via ORAL
  Filled 2018-12-07 (×9): qty 1

## 2018-12-07 NOTE — Progress Notes (Signed)
ANTICOAGULATION CONSULT NOTE - Initial Consult  Pharmacy Consult for Apixaban Indication: atrial fibrillation  Allergies  Allergen Reactions  . Septra Ds [Sulfamethoxazole-Trimethoprim] Nausea Only    Patient Measurements: Height: 5\' 5"  (165.1 cm) Weight: 293 lb 14 oz (133.3 kg) IBW/kg (Calculated) : 57  Vital Signs: Temp: 97.8 F (36.6 C) (01/11 1100) Temp Source: Oral (01/11 1100) BP: 137/62 (01/11 1100) Pulse Rate: 83 (01/11 1100)  Labs: Recent Labs    12/05/18 0404 12/05/18 2030 12/06/18 0222 12/07/18 0348  HGB 7.6* 7.9* 7.7* 8.2*  HCT 24.7* 26.3* 25.7* 26.1*  PLT  --   --  176  --   CREATININE 1.68*  --  1.70* 1.60*    Estimated Creatinine Clearance: 40 mL/min (A) (by C-G formula based on SCr of 1.6 mg/dL (H)).   Medical History: Past Medical History:  Diagnosis Date  . Anemia   . Aortic atherosclerosis ([place]) 12/03/2017   Noted on CT scan  . CAD (coronary artery disease), native coronary artery 12/03/2017   Noted on CT scan by calcification   . CHF (congestive heart failure) ([place])   . Chronic diastolic heart failure ([place]) 12/03/2017  . Dyspnea   . GERD (gastroesophageal reflux disease)   . Heart murmur   . History of blood transfusion    "related to chemo" (12/05/2017)  . Hypertension   . Hypertensive heart disease 12/03/2017  . Kidney disease, chronic, stage III (GFR 30-59 ml/min) ([place]) 12/03/2017  . Lung cancer ([place]) dx'd 02/2010  . Mitral valve prolapse   . Morbid obesity ([place]) 12/03/2017  . On home oxygen therapy   . 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 Paroxysmal atrial fibrillation ([facility])   . Persistent atrial fibrillation 12/03/2017  . Restrictive lung disease 05/23/2017    Assessment: 88 YOF on Apixaban PTA for hx Afib - held this admission due L-ankle fracture and need for repair - done on 1/10. Ortho okayed apixaban restart 1/11 - pharmacy consulted to dose.   The patient was on Apixaban PTA and the home dose remains appropriate at this  time due to age<80, wt>60 kg - will resume 5 mg bid.   Goal of Therapy:  Appropriate anticoagulation for indication and hepatic/renal function    Plan:  - Restart Apixaban 5 mg bid - On PTA, AVS placed in chart - Pharmacy will sign off of the consult but continue to monitor peripherally for any bleeding or need for dose adjustments.  Thank you for allowing pharmacy to be a part of this patient's care.  Alycia Rossetti, PharmD, BCPS Clinical Pharmacist Clinical phone for 12/07/2018: [phone] 12/07/2018 12:17 PM   **Pharmacist phone directory can now be found on [email or web address] ([email or web address]).  Listed under [place].

## 2018-12-07 NOTE — Evaluation (Signed)
Occupational Therapy Evaluation Patient Details Name: Diane [name] MRN: [id number] DOB: 1941-07-25 Today's Date: 12/07/2018    History of Present Illness Pt is a 78 y.o. female with PMH of COPD (2L O2 home), CHF, A. fib on Eliquis, CAD, HTN, HLD, and lung CA s/p chemo, admitted 11/29/18 after fall sustaning L ankle fx. Now s/p L ankle ORIF 12/06/18.    Clinical Impression   PTA Pt independent in ADL and mobility: drives, community mobilizer. Performed sliding board transfer from bed to drop arm BSC as pt reporting she could have BM (per RN, has been constipated). Pt able to transfer with [name] of bed pad and maxA+2 and frequent cues for sequencing, safety, and LLE NWB precautions. Pt able to assist scooting with BUEs, and then was able to perform lateral leans for removal of bed pad to use BSC. SpO2 down to 87% on 2L O2 [name], maintaining >90% on 3L O2 [name]. Pt easily fatigued by minimal activity with DOE 3/4 requiring frequent rest breaks. Pt is total A for LB dressing/bathing and set up for grooming/feeding. Pt will require skilled OT in the acute setting as well as afterwards at the SNF level.     Follow Up Recommendations  SNF;Supervision/Assistance - 24 hour    Equipment Recommendations  Other (comment)(defer to next venue of care)    Recommendations for Other Services       Precautions / Restrictions Precautions Precautions: Fall Restrictions Weight Bearing Restrictions: Yes LLE Weight Bearing: Non weight bearing      Mobility Bed Mobility Overal bed mobility: Needs Assistance Bed Mobility: Supine to Sit     Supine to sit: Mod assist;HOB elevated     General bed mobility comments: ModA to assist hips to EOB, exiting bed on R-side. Heavy reliance on rail support  Transfers Overall transfer level: Needs assistance Equipment used: Sliding board Transfers: Lateral/Scoot Transfers          Lateral/Scoot Transfers: Max assist;+2 physical assistance;With slide board General  transfer comment: MaxA+2 for slide board transfer from bed to drop arm BSC; consistent cueing for sequencing, safety, and LLE NWB. Heavy assist required with use of bed pad over sliding board. Pt able to assist scooting with BUEs    Balance Overall balance assessment: Needs assistance Sitting-balance support: No upper extremity supported;Feet supported Sitting balance-Leahy Scale: Poor Sitting balance - Comments: Reliant on UE support to maintain anterior weight translation. Dependent for pericare Postural control: Posterior lean                                 ADL either performed or assessed with clinical judgement   ADL Overall ADL's : Needs assistance/impaired Eating/Feeding: Set up   Grooming: Set up;Sitting;Wash/dry hands;Wash/dry face   Upper Body Bathing: Moderate assistance   Lower Body Bathing: Maximal assistance   Upper Body Dressing : Set up   Lower Body Dressing: Total assistance   Toilet Transfer: Maximal assistance;+2 for physical assistance;+2 for safety/equipment;Transfer board;BSC;Requires drop arm;Requires wide/bariatric Toilet Transfer Details (indicate cue type and reason): use of bed pad to assist with transfer, then lateral leans to remove pad Toileting- Clothing Manipulation and Hygiene: Total assistance;Sitting/lateral lean Toileting - Clothing Manipulation Details (indicate cue type and reason): on commode     Functional mobility during ADLs: Maximal assistance;+2 for physical assistance;+2 for safety/equipment       Vision Patient Visual Report: No change from baseline       Perception  Praxis      Pertinent Vitals/Pain Pain Assessment: Faces Faces Pain Scale: Hurts little more Pain Location: LLE Pain Descriptors / Indicators: Aching;Grimacing;Guarding Pain Intervention(s): Monitored during session;Repositioned     Hand Dominance Right   Extremity/Trunk Assessment Upper Extremity Assessment Upper Extremity Assessment:  Overall WFL for tasks assessed;Generalized weakness   Lower Extremity Assessment Lower Extremity Assessment: Defer to PT evaluation   Cervical / Trunk Assessment Cervical / Trunk Assessment: Other exceptions Cervical / Trunk Exceptions: extra body habitus   Communication Communication Communication: No difficulties   Cognition Arousal/Alertness: Awake/alert Behavior During Therapy: WFL for tasks assessed/performed Overall Cognitive Status: Within Functional Limits for tasks assessed                                     General Comments  SpO2 down to 87% on 2L O2 [name] with mobility; >90% on 3L O2 [name]    Exercises     Shoulder Instructions      Home Living Family/patient expects to be discharged to:: Skilled nursing facility                                        Prior Functioning/Environment Level of Independence: Independent        Comments: no AD for mobility, Drives        OT Problem List: Decreased activity tolerance;Impaired balance (sitting and/or standing);Decreased knowledge of use of DME or AE;Decreased knowledge of precautions;Obesity;Pain;Increased edema      OT Treatment/Interventions: Self-care/ADL training;DME and/or AE instruction;Therapeutic exercise;Therapeutic activities;Patient/family education;Balance training    OT Goals(Current goals can be found in the care plan section) Acute Rehab OT Goals Patient Stated Goal: home when able to walk OT Goal Formulation: With patient Time For Goal Achievement: 12/21/18 Potential to Achieve Goals: Good ADL Goals Pt Will Perform Lower Body Bathing: with set-up;with adaptive equipment;sitting/lateral leans Pt Will Perform Lower Body Dressing: with max assist;sit to/from stand Pt Will Transfer to Toilet: with min assist;stand pivot transfer;bedside commode Pt Will Perform Toileting - Clothing Manipulation and hygiene: with min guard assist;sitting/lateral leans Additional ADL Goal  #1: Pt will perform bed mobility at supervision level prior to engaging in ADL  OT Frequency: Min 2X/week   Barriers to D/C:            Co-evaluation PT/OT/SLP Co-Evaluation/Treatment: Yes Reason for Co-Treatment: For patient/therapist safety;To address functional/ADL transfers PT goals addressed during session: Mobility/safety with mobility;Balance OT goals addressed during session: ADL's and self-care;Proper use of Adaptive equipment and DME      AM-PAC OT "6 Clicks" Daily Activity     Outcome Measure Help from another person eating meals?: A Little Help from another person taking care of personal grooming?: A Little Help from another person toileting, which includes using toliet, bedpan, or urinal?: A Lot Help from another person bathing (including washing, rinsing, drying)?: A Lot Help from another person to put on and taking off regular upper body clothing?: A Little Help from another person to put on and taking off regular lower body clothing?: Total 6 Click Score: 14   End of Session Equipment Utilized During Treatment: Oxygen(2 increased to 3L) Nurse Communication: Mobility status  Activity Tolerance: Patient tolerated treatment well Patient left: Other (comment);with call bell/phone within reach(on [facility])  OT Visit Diagnosis: Other abnormalities of gait and mobility (R26.89);History of  falling (Z91.81);Pain Pain - Right/Left: Left Pain - part of body: Ankle and joints of foot                Time: 3491-7915 OT Time Calculation (min): 32 min Charges:  OT General Charges $OT Visit: 1 Visit OT Evaluation $OT Eval Moderate Complexity: [place] OTR/L Acute Rehabilitation Services Pager: [phone] Office: [place] 12/07/2018, 5:28 PM

## 2018-12-07 NOTE — Plan of Care (Signed)
  Problem: Nutrition: Goal: Adequate nutrition will be maintained Outcome: Progressing   Problem: Pain Managment: Goal: General experience of comfort will improve Outcome: Progressing   

## 2018-12-07 NOTE — Progress Notes (Signed)
Patient ID: Diane [name], female   DOB: 1941-02-24, 78 y.o.   MRN: [id number] Subjective: 1 Day Post-Op Procedure(s) (LRB): OPEN REDUCTION INTERNAL FIXATION (ORIF) ANKLE FRACTURE (Left)    Patient reports pain as mild to moderate.  Notes more of a challenge to move toes this am.  No events reported  Objective:   VITALS:   Vitals:   12/07/18 0115 12/07/18 0406  BP:  (!) 155/66  Pulse:  81  Resp: 19 18  Temp:  98.4 F (36.9 C)  SpO2:  98%    Neurovascular intact - all toes with normal color and warmth.  Intact sensibility Incision: dressing (splint) C/D/I  LABS Recent Labs    12/05/18 2030 12/06/18 0222 12/07/18 0348  HGB 7.9* 7.7* 8.2*  HCT 26.3* 25.7* 26.1*  WBC  --  5.4  --   PLT  --  176  --     Recent Labs    12/05/18 0404 12/06/18 0222 12/07/18 0348  NA 137 135 135  K 3.8 3.7 4.6  BUN 23 23 24*  CREATININE 1.68* 1.70* 1.60*  GLUCOSE 109* 107* 127*    No results for input(s): LABPT, INR in the last 72 hours.   Assessment/Plan: 1 Day Post-Op Procedure(s) (LRB): OPEN REDUCTION INTERNAL FIXATION (ORIF) ANKLE FRACTURE (Left)   Advance diet Up with therapy - NWB LLE for 6 weeks DVT prophylaxis

## 2018-12-07 NOTE — Progress Notes (Signed)
Physical Therapy Treatment Patient Details Name: Diane [name] MRN: [id number] DOB: January 22, 1941 Today's Date: 12/07/2018    History of Present Illness Pt is a 78 y.o. female with PMH of COPD (2L O2 home), CHF, A. fib on Eliquis, CAD, HTN, HLD, and lung CA s/p chemo, admitted 11/29/18 after fall sustaning L ankle fx. Now s/p L ankle ORIF 12/06/18.    PT Comments    Pt seen for additional transfer to assist transfer to recliner; pt with increased fatigue, requiring maxA+2 to [place] for sliding board transfer to drop arm recliner. Required +3 assist for repositioning in recliner. Recommend nursing staff perform transfers with use of maximove lift (RN/NT informed).    Follow Up Recommendations  SNF;Supervision/Assistance - 24 hour     Equipment Recommendations  Wheelchair (measurements PT)    Recommendations for Other Services       Precautions / Restrictions Precautions Precautions: Fall Restrictions Weight Bearing Restrictions: Yes LLE Weight Bearing: Non weight bearing    Mobility  Bed Mobility Overal bed mobility: Needs Assistance Bed Mobility: Supine to Sit     Supine to sit: Mod assist;HOB elevated     General bed mobility comments: Assist +3 to scoot up while supine in recliner  Transfers Overall transfer level: Needs assistance Equipment used: Sliding board Transfers: Lateral/Scoot Transfers          Lateral/Scoot Transfers: Max assist;+2 physical assistance;With slide board General transfer comment: MaxA+2 for slide board transfer from [facility] to recliner; consistent cueing for sequencing, safety, and LLE NWB. Pt with difficulty assisting the scoot with BUE support, requiring maxA+2 to [place] assist from therapist. Pt fatiguing quickly, having to lay back on recliner, requiring 3+ assist to scoot up/reposition  Ambulation/Gait             General Gait Details: Unable   Stairs             Wheelchair Mobility    Modified Rankin (Stroke Patients  Only)       Balance Overall balance assessment: Needs assistance Sitting-balance support: No upper extremity supported;Feet supported Sitting balance-Leahy Scale: Poor Sitting balance - Comments: Reliant on UE support to maintain anterior weight translation. Dependent for pericare Postural control: Posterior lean                                  Cognition Arousal/Alertness: Awake/alert Behavior During Therapy: WFL for tasks assessed/performed Overall Cognitive Status: Within Functional Limits for tasks assessed                                        Exercises      General Comments General comments (skin integrity, edema, etc.): SpO2 98% on 3L O2 [place], 96% when returned to 2L O2 [place]      Pertinent Vitals/Pain Pain Assessment: Faces Faces Pain Scale: Hurts little more Pain Location: LLE Pain Descriptors / Indicators: Aching;Grimacing;Guarding Pain Intervention(s): Limited activity within patient's tolerance;Monitored during session    Home Living                      Prior Function            PT Goals (current goals can now be found in the care plan section) Acute Rehab PT Goals Patient Stated Goal: home when able to walk PT Goal Formulation: With patient  Time For Goal Achievement: 12/14/18 Potential to Achieve Goals: Fair Progress towards PT goals: Progressing toward goals    Frequency    Min 3X/week      PT Plan Current plan remains appropriate    Co-evaluation PT/OT/SLP Co-Evaluation/Treatment: Yes Reason for Co-Treatment: For patient/therapist safety;To address functional/ADL transfers PT goals addressed during session: Mobility/safety with mobility        AM-PAC PT "6 Clicks" Mobility   Outcome Measure  Help needed turning from your back to your side while in a flat bed without using bedrails?: A Lot Help needed moving from lying on your back to sitting on the side of a flat bed without using bedrails?: A  Lot Help needed moving to and from a bed to a chair (including a wheelchair)?: A Lot Help needed standing up from a chair using your arms (e.g., wheelchair or bedside chair)?: Total Help needed to walk in hospital room?: Total Help needed climbing 3-5 steps with a railing? : Total 6 Click Score: 9    End of Session Equipment Utilized During Treatment: Oxygen Activity Tolerance: Patient limited by fatigue Patient left: in chair;with call bell/phone within reach;with chair alarm set Nurse Communication: Mobility status;Need for lift equipment PT Visit Diagnosis: Other abnormalities of gait and mobility (R26.89);Pain;Muscle weakness (generalized) (M62.81) Pain - Right/Left: Left Pain - part of body: Ankle and joints of foot     Time: 1414-1444 PT Time Calculation (min) (ACUTE ONLY): 30 min  Charges:  $Therapeutic Activity: 8-22 mins                    Mabeline Caras, PT, DPT Acute Rehabilitation Services  Pager [phone] Office [place] 12/07/2018, 3:42 PM

## 2018-12-07 NOTE — Clinical Social Work Note (Signed)
Clinical Social Work Assessment  Patient Details  Name: Diane [name] MRN: [id number] Date of Birth: [birth year]  Date of referral:  12/07/18               Reason for consult:  Discharge Planning                Permission sought to share information with:  Case Manager, Facility Sport and exercise psychologist, Family Supports Permission granted to share information::  Yes, Verbal Permission Granted  Name::     Corporate treasurer::  SNFs  Relationship::  spouse  Contact Information:  [phone]  Housing/Transportation Living arrangements for the past 2 months:  [place] of Information:  Patient Patient Interpreter Needed:  None Criminal Activity/Legal Involvement Pertinent to Current Situation/Hospitalization:  No - Comment as needed Significant Relationships:  Spouse Lives with:  Self Do you feel safe going back to the place where you live?  No Need for family participation in patient care:  No (Coment)  Care giving concerns:  CSW received referral for possible SNF placement at time of discharge. Spoke with patient regarding possibility of SNF placement . Patient's husband   is currently unable to care for her at their home given patient's current needs and fall risk.  Patient expressed understanding of PT recommendation and are agreeable to SNF placement at time of discharge. CSW to continue to follow and assist with discharge planning needs.     Social Worker assessment / plan: Spoke with patient concerning possibility of rehab at SNF before returning home.    Employment status:  Retired Nurse, adult PT Recommendations:  [place] / Referral to community resources:  [place]  Patient/Family's Response to care:  Patient recognizes need for rehab before returning home and are agreeable to a SNF in [place]. They report preference for [facility]. CSW explained insurance authorization process.  Patient's family reported that they want patient to get stronger to be able to come back home.   Patient/Family's Understanding of and Emotional Response to Diagnosis, Current Treatment, and Prognosis:  Patient/family is realistic regarding therapy needs and expressed being hopeful for SNF placement. Patient expressed understanding of CSW role and discharge process as well as medical condition. No questions/concerns about plan or treatment.    Emotional Assessment Appearance:  Appears stated age Attitude/Demeanor/Rapport:  Gracious Affect (typically observed):  Accepting, Adaptable Orientation:  Oriented to Self, Oriented to Place, Oriented to  Time Alcohol / Substance use:  Not Applicable Psych involvement (Current and /or in the community):  No (Comment)  Discharge Needs  Concerns to be addressed:  Discharge Planning Concerns Readmission within the last 30 days:  No Current discharge risk:  Dependent with Mobility Barriers to Discharge:  Continued Medical Work up   [employer], LCSW 12/07/2018, 10:18 AM

## 2018-12-07 NOTE — NC FL2 (Signed)
[place] LEVEL OF CARE SCREENING TOOL     IDENTIFICATION  Patient Name: Diane [name] Birthdate: 11-29-1940 Sex: female Admission Date (Current Location): 11/29/2018  [facility] and [place] Number:  Herbalist and Address:  [facility], [place] [address] [place], [place] [address]      Provider Number: [id number]  Attending Physician Name and Address:  Cristal Ford, DO  Relative Name and Phone Number:  Anorah Trias; husband; [phone]    Current Level of Care: Hospital Recommended Level of Care: [place] Prior Approval Number:    Date Approved/Denied:   PASRR Number: [id number]  Discharge Plan: SNF    Current Diagnoses: Patient Active Problem List   Diagnosis Date Noted  . Fall at home, initial encounter 11/30/2018  . Trimalleolar fracture of ankle, closed, left, initial encounter 11/29/2018  . Polyp of cecum   . Polyp of ascending colon   . Polyp of transverse colon   . Polyp of descending colon   . Polyp of rectum   . Heme positive stool 06/11/2018  . Oxygen dependent 06/11/2018  . Status post bronchoscopy with biopsy   . Anemia 04/25/2018  . Chronic respiratory failure with hypoxia ([place]) 01/17/2018  . Hypotension 12/13/2017  . COPD (chronic obstructive pulmonary disease) ([place]) 12/11/2017  . Chronic diastolic heart failure ([place]) 12/03/2017  . Persistent atrial fibrillation; CHA2DS2Vacs = 6; Eliquis; Amiodarone for rhythm control 12/03/2017  . Chronic anticoagulation 12/03/2017  . Morbid obesity ([place]) 12/03/2017  . Kidney disease, chronic, stage III (GFR 30-59 ml/min) (HCC) 12/03/2017  . Hypertensive heart and chronic kidney disease with heart failure and stage 1 through stage 4 chronic kidney disease, or chronic kidney disease ([place]) 12/03/2017  . Coronary artery disease involving native coronary artery of native heart without angina pectoris 12/03/2017  . Aortic atherosclerosis ([place]) 12/03/2017   . Restrictive lung disease 05/23/2017  . Port catheter in place 2020/12/2615  . Cancer of right lung parenchyma ([place]) 10/17/2011    Orientation RESPIRATION BLADDER Height & Weight     Self, Time, Situation, Place  O2(1L nasal canula) Continent Weight: 133.3 kg Height:  5\' 5"  (165.1 cm)  BEHAVIORAL SYMPTOMS/MOOD NEUROLOGICAL BOWEL NUTRITION STATUS      Continent Diet(see discharge summary)  AMBULATORY STATUS COMMUNICATION OF NEEDS Skin   Extensive Assist Verbally Surgical wounds                       Personal Care Assistance Level of Assistance  Bathing, Feeding, Dressing Bathing Assistance: Maximum assistance Feeding assistance: Independent Dressing Assistance: Maximum assistance     Functional Limitations Info  Hearing, Sight, Speech Sight Info: Adequate Hearing Info: Adequate Speech Info: Adequate    SPECIAL CARE FACTORS FREQUENCY  PT (By licensed PT), OT (By licensed OT)     PT Frequency: 5x week OT Frequency: 5x week            Contractures Contractures Info: Not present    Additional Factors Info  Code Status, Allergies Code Status Info: Full Code Allergies Info: SEPTRA DS SULFAMETHOXAZOLE-TRIMETHOPRIM            Current Medications (12/07/2018):  This is the current hospital active medication list Current Facility-Administered Medications  Medication Dose Route Frequency Provider Last Rate Last Dose  . 0.9 %  sodium chloride infusion (Manually program via Guardrails IV Fluids)   Intravenous Once Nicholes Stairs, MD      . acetaminophen (TYLENOL) tablet 650 mg  650 mg Oral Q6H Nicholes Stairs, MD   650 mg at 12/07/18 0650  . amiodarone (PACERONE) tablet 200 mg  200 mg Oral Daily Nicholes Stairs, MD   200 mg at 12/07/18 0931  . calcitRIOL (ROCALTROL) capsule 0.5 mcg  0.5 mcg Oral Daily Nicholes Stairs, MD   0.5 mcg at 12/07/18 0932  . carvedilol (COREG) tablet 25 mg  25 mg Oral BID WC Nicholes Stairs, MD   25 mg at  12/07/18 0932  . docusate sodium (COLACE) capsule 100 mg  100 mg Oral BID Nicholes Stairs, MD   100 mg at 12/07/18 0931  . fentaNYL (SUBLIMAZE) injection 25 mcg  25 mcg Intravenous Q3H PRN Nicholes Stairs, MD   25 mcg at 11/30/18 1538  . hydrALAZINE (APRESOLINE) tablet 25 mg  25 mg Oral BID Nicholes Stairs, MD   25 mg at 12/07/18 0931  . lactated ringers infusion   Intravenous Continuous Nicholes Stairs, MD 10 mL/hr at 12/06/18 1812    . Melatonin TABS 9 mg  9 mg Oral QHS Nicholes Stairs, MD   9 mg at 12/06/18 2201  . menthol-cetylpyridinium (CEPACOL) lozenge 3 mg  1 lozenge Oral PRN Nicholes Stairs, MD   3 mg at 12/04/18 1601  . metoCLOPramide (REGLAN) tablet 5-10 mg  5-10 mg Oral Q8H PRN Nicholes Stairs, MD       Or  . metoCLOPramide [facility]) injection 5-10 mg  5-10 mg Intravenous Q8H PRN Nicholes Stairs, MD      . ondansetron [facility]) tablet 4 mg  4 mg Oral Q6H PRN Nicholes Stairs, MD       Or  . ondansetron [facility]) injection 4 mg  4 mg Intravenous Q6H PRN Nicholes Stairs, MD      . oxyCODONE (Oxy IR/ROXICODONE) immediate release tablet 5 mg  5 mg Oral Q4H PRN Nicholes Stairs, MD   5 mg at 12/05/18 2215  . polyethylene glycol (MIRALAX / GLYCOLAX) packet 17 g  17 g Oral BID Nicholes Stairs, MD   17 g at 12/07/18 0931  . senna (SENOKOT) tablet 17.2 mg  2 tablet Oral QHS Nicholes Stairs, MD   17.2 mg at 12/06/18 2201  . sodium chloride flush (NS) 0.9 % injection 10-40 mL  10-40 mL Intracatheter PRN Nicholes Stairs, MD   20 mL at 12/06/18 0229  . umeclidinium-vilanterol (ANORO ELLIPTA) 62.5-25 MCG/INH 1 puff  1 puff Inhalation Daily Nicholes Stairs, MD   1 puff at 12/07/18 0805   Facility-Administered Medications Ordered in Other Encounters  Medication Dose Route Frequency Provider Last Rate Last Dose  . sodium chloride 0.9 % injection 10 mL  10 mL Intravenous PRN Curt Bears, MD   10 mL at 10/14/13  1030     Discharge Medications: Please see discharge summary for a list of discharge medications.  Relevant Imaging Results:  Relevant Lab Results:   Additional Information SS#[id number] [place], [place]

## 2018-12-07 NOTE — Progress Notes (Signed)
Physical Therapy Treatment Patient Details Name: Diane [name] MRN: [id number] DOB: Dec 23, 1940 Today's Date: 12/07/2018    History of Present Illness Pt is a 78 y.o. female with PMH of COPD (2L O2 home), CHF, A. fib on Eliquis, CAD, HTN, HLD, and lung CA s/p chemo, admitted 11/29/18 after fall sustaning L ankle fx. Now s/p L ankle ORIF 12/06/18.    PT Comments    Pt slowly progressing with mobility. Performed sliding board transfer from bed to drop arm BSC as pt reporting she could have BM (per RN, has been constipated). Pt able to transfer with maxA+2 and frequent cues for sequencing, safety, and LLE NWB precautions. Pt able to assist scooting with BUEs. SpO2 down to 87% on 2L O2 [place], maintaining >90% on 3L O2 [place]. Pt easily fatigued by minimal activity with DOE 3/4 requiring frequent rest breaks.   Follow Up Recommendations  SNF;Supervision/Assistance - 24 hour     Equipment Recommendations  Wheelchair (measurements PT)    Recommendations for Other Services       Precautions / Restrictions Precautions Precautions: Fall Restrictions Weight Bearing Restrictions: Yes LLE Weight Bearing: Non weight bearing    Mobility  Bed Mobility Overal bed mobility: Needs Assistance Bed Mobility: Supine to Sit     Supine to sit: Mod assist;HOB elevated     General bed mobility comments: ModA to assist hips to EOB, exiting bed on R-side. Heavy reliance on rail support  Transfers Overall transfer level: Needs assistance Equipment used: Sliding board Transfers: Lateral/Scoot Transfers          Lateral/Scoot Transfers: Max assist;+2 physical assistance;With slide board General transfer comment: MaxA+2 for slide board transfer from bed to drop arm BSC; consistent cueing for sequencing, safety, and LLE NWB. Heavy assist required with use of bed pad over sliding board. Pt able to assist scooting with BUEs  Ambulation/Gait             General Gait Details: Unable   Stairs              Wheelchair Mobility    Modified Rankin (Stroke Patients Only)       Balance Overall balance assessment: Needs assistance Sitting-balance support: No upper extremity supported;Feet supported Sitting balance-Leahy Scale: Fair                                      Cognition Arousal/Alertness: Awake/alert Behavior During Therapy: WFL for tasks assessed/performed Overall Cognitive Status: Within Functional Limits for tasks assessed                                        Exercises      General Comments General comments (skin integrity, edema, etc.): SpO2 down to 87% on 2L O2 [place] with mobility; >90% on 3L O2 [place]      Pertinent Vitals/Pain Pain Assessment: Faces Faces Pain Scale: Hurts little more Pain Location: LLE Pain Descriptors / Indicators: Aching;Grimacing;Guarding Pain Intervention(s): Monitored during session;Limited activity within patient's tolerance    Home Living                      Prior Function            PT Goals (current goals can now be found in the care plan section) Acute Rehab PT Goals Patient  Stated Goal: home when able to walk PT Goal Formulation: With patient Time For Goal Achievement: 12/14/18 Potential to Achieve Goals: Fair Progress towards PT goals: Progressing toward goals    Frequency    Min 3X/week      PT Plan Current plan remains appropriate    Co-evaluation PT/OT/SLP Co-Evaluation/Treatment: Yes Reason for Co-Treatment: For patient/therapist safety;To address functional/ADL transfers PT goals addressed during session: Proper use of DME;Mobility/safety with mobility        AM-PAC PT "6 Clicks" Mobility   Outcome Measure  Help needed turning from your back to your side while in a flat bed without using bedrails?: A Little Help needed moving from lying on your back to sitting on the side of a flat bed without using bedrails?: A Lot Help needed moving to and from a bed to a  chair (including a wheelchair)?: A Lot Help needed standing up from a chair using your arms (e.g., wheelchair or bedside chair)?: Total Help needed to walk in hospital room?: Total Help needed climbing 3-5 steps with a railing? : Total 6 Click Score: 10    End of Session Equipment Utilized During Treatment: Oxygen Activity Tolerance: Patient tolerated treatment well Patient left: with call bell/phone within reach(seated on [facility] for BM attempt) Nurse Communication: Mobility status PT Visit Diagnosis: Other abnormalities of gait and mobility (R26.89);Pain;Muscle weakness (generalized) (M62.81) Pain - Right/Left: Left Pain - part of body: Ankle and joints of foot     Time: 2353-6144 PT Time Calculation (min) (ACUTE ONLY): 32 min  Charges:  $Therapeutic Activity: 8-22 mins                    Mabeline Caras, PT, DPT Acute Rehabilitation Services  Pager [phone] Office [place] 12/07/2018, 3:08 PM

## 2018-12-07 NOTE — Progress Notes (Signed)
Occupational Therapy Treatment Patient Details Name: Diane [name] MRN: [id number] DOB: 1941-02-12 Today's Date: 12/07/2018    History of present illness Pt is a 78 y.o. female with PMH of COPD (2L O2 home), CHF, A. fib on Eliquis, CAD, HTN, HLD, and lung CA s/p chemo, admitted 11/29/18 after fall sustaning L ankle fx. Now s/p L ankle ORIF 12/06/18.    OT comments  Pt progressing towards OT goals. Pt received on BSC, and was a total A for peri care with patient able to flex trunk for access to rear peri care from the back. Pt seen for additional transfer to assist transfer to recliner; pt with increased fatigue, requiring maxA+2 to [place] for sliding board transfer to drop arm recliner. Required +3 assist for repositioning in recliner. Recommend nursing staff perform transfers with use of maximove lift (RN/NT informed). OT will continue to follow acutely.   Follow Up Recommendations  SNF;Supervision/Assistance - 24 hour    Equipment Recommendations  Other (comment)(defer to next venue of care)    Recommendations for Other Services      Precautions / Restrictions Precautions Precautions: Fall Restrictions Weight Bearing Restrictions: Yes LLE Weight Bearing: Non weight bearing       Mobility Bed Mobility Overal bed mobility: Needs Assistance Bed Mobility: Supine to Sit     Supine to sit: Mod assist;HOB elevated     General bed mobility comments: NT this session- Pt OOB in recliner at EOS  Transfers Overall transfer level: Needs assistance Equipment used: Sliding board Transfers: Lateral/Scoot Transfers          Lateral/Scoot Transfers: Max assist;+2 physical assistance;With slide board General transfer comment: MaxA+2 for slide board transfer from [facility] to recliner; consistent cueing for sequencing, safety, and LLE NWB. Pt with difficulty assisting the scoot with BUE support, requiring maxA+2 to [place] assist from therapist. Pt fatiguing quickly, having to lay back on  recliner, requiring 3+ assist to scoot up/reposition    Balance Overall balance assessment: Needs assistance Sitting-balance support: No upper extremity supported;Feet supported Sitting balance-Leahy Scale: Poor Sitting balance - Comments: Reliant on UE support to maintain anterior weight translation. Dependent for pericare Postural control: Posterior lean                                 ADL either performed or assessed with clinical judgement   ADL Overall ADL's : Needs assistance/impaired Eating/Feeding: Set up   Grooming: Set up;Sitting;Wash/dry hands;Wash/dry face Grooming Details (indicate cue type and reason): in recliner Upper Body Bathing: Moderate assistance   Lower Body Bathing: Maximal assistance   Upper Body Dressing : Set up   Lower Body Dressing: Total assistance   Toilet Transfer: Maximal assistance;+2 for physical assistance;+2 for safety/equipment;Transfer board;BSC;Requires drop arm;Requires wide/bariatric Toilet Transfer Details (indicate cue type and reason): use of bed pad to assist with transfer, then lateral leans to remove pad Toileting- Clothing Manipulation and Hygiene: Total assistance;Sitting/lateral lean Toileting - Clothing Manipulation Details (indicate cue type and reason): on commode     Functional mobility during ADLs: Maximal assistance;+2 for physical assistance;+2 for safety/equipment(lateral scoot transfer only)       Vision Patient Visual Report: No change from baseline     Perception     Praxis      Cognition Arousal/Alertness: Awake/alert Behavior During Therapy: WFL for tasks assessed/performed Overall Cognitive Status: Within Functional Limits for tasks assessed  Exercises     Shoulder Instructions       General Comments SpO2 98% on 3L O2 [place], 96% when returned to 2L O2 [place]    Pertinent Vitals/ Pain       Pain Assessment: Faces Faces Pain Scale:  Hurts little more Pain Location: LLE Pain Descriptors / Indicators: Aching;Grimacing;Guarding Pain Intervention(s): Limited activity within patient's tolerance;Monitored during session;Repositioned;Other (comment)(elevation)  Home Living Family/patient expects to be discharged to:: Skilled nursing facility                                        Prior Functioning/Environment Level of Independence: Independent        Comments: no AD for mobility, Drives   Frequency  Min 2X/week        Progress Toward Goals  OT Goals(current goals can now be found in the care plan section)  Progress towards OT goals: Progressing toward goals  Acute Rehab OT Goals Patient Stated Goal: home when able to walk OT Goal Formulation: With patient Time For Goal Achievement: 12/21/18 Potential to Achieve Goals: Good ADL Goals Pt Will Perform Lower Body Bathing: with set-up;with adaptive equipment;sitting/lateral leans Pt Will Perform Lower Body Dressing: with max assist;sit to/from stand Pt Will Transfer to Toilet: with min assist;stand pivot transfer;bedside commode Pt Will Perform Toileting - Clothing Manipulation and hygiene: with min guard assist;sitting/lateral leans Additional ADL Goal #1: Pt will perform bed mobility at supervision level prior to engaging in ADL  Plan Discharge plan remains appropriate;Frequency remains appropriate    Co-evaluation    PT/OT/SLP Co-Evaluation/Treatment: Yes Reason for Co-Treatment: For patient/therapist safety;To address functional/ADL transfers PT goals addressed during session: Mobility/safety with mobility;Balance OT goals addressed during session: ADL's and self-care;Proper use of Adaptive equipment and DME;Strengthening/ROM      AM-PAC OT "6 Clicks" Daily Activity     Outcome Measure   Help from another person eating meals?: A Little Help from another person taking care of personal grooming?: A Little Help from another person  toileting, which includes using toliet, bedpan, or urinal?: A Lot Help from another person bathing (including washing, rinsing, drying)?: A Lot Help from another person to put on and taking off regular upper body clothing?: A Little Help from another person to put on and taking off regular lower body clothing?: Total 6 Click Score: 14    End of Session Equipment Utilized During Treatment: Oxygen(2 increased to 3L)  OT Visit Diagnosis: Other abnormalities of gait and mobility (R26.89);History of falling (Z91.81);Pain Pain - Right/Left: Left Pain - part of body: Ankle and joints of foot   Activity Tolerance Patient tolerated treatment well   Patient Left Other (comment);with call bell/phone within reach(on [facility])   Nurse Communication Mobility status        Time: 4481-8563 OT Time Calculation (min): 30 min  Charges: OT General Charges $OT Visit: 1 Visit OT Evaluation $OT Eval Moderate Complexity: 1 Mod OT Treatments $Self Care/Home Management : 8-22 mins  Hulda Humphrey OTR/L Acute Rehabilitation Services Pager: [phone] Office: [place] 12/07/2018, 5:34 PM

## 2018-12-07 NOTE — Plan of Care (Signed)

## 2018-12-07 NOTE — Progress Notes (Signed)
PROGRESS NOTE    Diane [name] [facility]  UJW:[id number] DOB: March 07, 1941 DOA: 11/29/2018 PCP: [name], MD   Brief Narrative:  HPI on 11/29/2018 by Dr. Fuller Plan Diane [name] is a 78 y.o. female with medical history significant of COPD on 2 L of nasal cannula oxygen with activity, diastolic CHF, A. fib on Eliquis, CAD, MVP, HTN, HLD, and lung cancer s/p chemo followed by [name]; who presents after having a fall at home.  Patient reports that she had just gotten back from the beauty shop and was coming up the steps.  She put her hand on the windowsill as she normally does to help get up the steps, but her hand slipped.  Patient fell backwards hitting her head on the concrete and twisting her left ankle.  She did not lose consciousness, but reported excruciating pain in swelling of the left ankle.  Her husband with whom she lives was unable to help her up and called EMS.  Patient reports that otherwise she has no complaints.  Denies fever, chills, chest pain,  Interim history Admitted with left ankle fracture, orthopedics consulted.  Plan for surgery today 12/06/2018. Also found to have AKI and placed on IVF.  Assessment & Plan   Fall with left ankle fracture -patient fell at home onto the concrete -X-rays revealed trimalleolar, distal tibia and fibular fractures -Patient underwent close reduction in the emergency department -Orthopedic surgery consulted and appreciated.  Dr. [name] initially recommended outpatient follow-up, however patient was admitted as she was unable to ambulate.  PT recommending SNF. -s/p Open treatment of left ankle fracture with internal fixation.Trimalleolar w/o fixation of posterior malleolus CPT 27822.  -Orthopedics recommending NWB LLE for 6 weeks -Continue pain control -Discussed with orthopedics, will restart Eliquis today  Acute kidney injury on chronic kidney disease, stage IV -Creatinine peaked to 3.19, however appears to be improving, currently  1.60 (baseline around 2) -Renal ultrasound showed no acute findings or hydronephrosis -UA unremarkable for infection -Continue to avoid nephrotoxic agents -placed on IVF  -Monitor BMP  Anemia, likely of chronic disease/ Iron def -Hemoglobin dropped to 7.7 (upon review of patient's chart, her baseline hemoglobin ranges from 8-10) -Upon admission, patient did have a hemoglobin of 11.5 however drop may be dilutional as patient was receiving IV fluid -No active signs of bleeding -anemia panel on 12/01/2018 showed iron 16- given dose of Feraheme (oral supplementation held given constipation) -Transfused 1u PRBCs on 12/06/2018 -hemoglobin 8.2 -continue to monitor CBC  Low-grade fever -Patient with T-max of 100.1 F, no recurrent temperature -Possibly reactive to the above  Thrombocytopenia -Resolved, transient-unknown etiology -Continue to monitor CBC  Persistent atrial fibrillation -CHADSVASC score 6 -Patient on Eliquis, currently held for surgery -Continue Pacerone  Chronic diastolic heart failure -Patient appears to be compensated and euvolemic at this time -Echocardiogram 12/12/2017 showed an EF of 65 to 70% -Given acute kidney injury, torsemide held- will restart -Continue to monitor intake and output, daily weights  COPD -Without acute exacerbation -Patient uses home oxygen of 2 L with activity -Continue home medications as well as nebulizer treatments as needed  Adenocarcinoma of the lung -Followed by oncology, [name]  Essential hypertension -Continue hydralazine, Coreg  Hypokalemia -Resolved with replacement -Continue to monitor BMP  Left upper extremity swelling -Seems to be improving.  Patient states that she may have fallen but does not remember.  She is able to move her wrist without eliciting pain.  Patient also received IV fluid and blood transfusion -Continue  to monitor closely  Constipation -Continue bowel regimen, iron held  Morbid obesity -BMI  41.6 -Patient will need to discuss lifestyle modifications including diet exercise etc. with PCP on discharge  DVT Prophylaxis Eliquis  Code Status: Full  Family Communication: None at bedside  Disposition Plan: Admitted.  Pending SNF- suspect 12/09/2018  Consultants Orthopedic surgery  Procedures  Renal ultrasound Open treatment of left ankle fracture with internal fixation.Trimalleolar w/o fixation of posterior malleolus CPT 27822.   Antibiotics   Anti-infectives (From admission, onward)   Start     Dose/Rate Route Frequency Ordered Stop   12/06/18 1900  ceFAZolin (ANCEF) IVPB 2g/100 mL premix     2 g 200 mL/hr over 30 Minutes Intravenous Every 6 hours 12/06/18 1519 12/07/18 0700   12/06/18 1200  ceFAZolin (ANCEF) IVPB 2g/100 mL premix     2 g 200 mL/hr over 30 Minutes Intravenous On call to O.R. 12/06/18 1112 12/06/18 1335   12/06/18 1113  ceFAZolin (ANCEF) 2-4 GM/100ML-% IVPB    Note to Pharmacy:  [facility]   : cabinet override      12/06/18 1113 12/06/18 1305      Subjective:   Diane [name] seen and examined today.  Complains of left arm swelling.  States that she cannot move her toes as much today.  Denies current chest pain, shortness of breath, abdominal pain, nausea vomiting, diarrhea or constipation, dizziness or headache. Objective:   Vitals:   12/07/18 0115 12/07/18 0406 12/07/18 0806 12/07/18 0825  BP:  (!) 155/66  (!) 143/58  Pulse:  81  79  Resp: 19 18  20   Temp:  98.4 F (36.9 C)  97.9 F (36.6 C)  TempSrc:  Oral  Oral  SpO2:  98% 98% 96%  Weight:      Height:        Intake/Output Summary (Last 24 hours) at 12/07/2018 1146 Last data filed at 12/07/2018 1115 Gross per 24 hour  Intake 1587.6 ml  Output 401 ml  Net 1186.6 ml   Filed Weights   11/29/18 1436 12/06/18 0450 12/06/18 1129  Weight: 113.4 kg 133.3 kg 133.3 kg   Exam  General: Well developed, well nourished, NAD, appears stated age  HEENT: NCAT, mucous membranes moist.    Neck: Supple  Cardiovascular: S1 S2 auscultated, no murmur, RRR  Respiratory: Clear to auscultation bilaterally with equal chest rise  Abdomen: Soft, nontender, nondistended, + bowel sounds  Extremities: warm dry without cyanosis clubbing.  LLE splint in place.  LUE edema  Neuro: AAOx3, nonfocal  Psych: Appropriate mood and affect  Data Reviewed: I have personally reviewed following labs and imaging studies  CBC: Recent Labs  Lab 12/01/18 0332  12/02/18 0408 12/03/18 0344 12/04/18 0339 12/05/18 0404 12/05/18 2030 12/06/18 0222 12/07/18 0348  WBC 5.5  --  6.2 5.8 6.2  --   --  5.4  --   HGB 8.1*   < > 8.2* 7.9* 8.1* 7.6* 7.9* 7.7* 8.2*  HCT 26.2*   < > 26.7* 25.3* 25.5* 24.7* 26.3* 25.7* 26.1*  MCV 98.1  --  99.6 98.8 99.2  --   --  100.8*  --   PLT 128*  --  146* 149* 166  --   --  176  --    < > = values in this interval not displayed.   Basic Metabolic Panel: Recent Labs  Lab 12/01/18 0332 12/02/18 0408 12/03/18 0344 12/04/18 0339 12/05/18 0404 12/06/18 0222 12/07/18 0348  NA 135 136 137  136 137 135 135  K 3.2* 4.0 3.9 3.8 3.8 3.7 4.6  CL 100 103 103 105 104 104 103  CO2 27 27 27 27 27 26 26   GLUCOSE 107* 124* 107* 101* 109* 107* 127*  BUN 40* 36* 27* 23 23 23  24*  CREATININE 2.85* 2.46* 2.02* 1.79* 1.68* 1.70* 1.60*  CALCIUM 9.2 9.2 9.5 9.7 9.6 10.1 10.0  MG 2.3 2.3 2.2 2.4  --   --   --    GFR: Estimated Creatinine Clearance: 40 mL/min (A) (by C-G formula based on SCr of 1.6 mg/dL (H)). Liver Function Tests: Recent Labs  Lab 12/02/18 0408 12/03/18 0344  AST 14* 13*  ALT 11 10  ALKPHOS 34* 30*  BILITOT 0.6 0.9  PROT 6.1* 5.8*  ALBUMIN 2.6* 2.3*   No results for input(s): LIPASE, AMYLASE in the last 168 hours. No results for input(s): AMMONIA in the last 168 hours. Coagulation Profile: No results for input(s): INR, PROTIME in the last 168 hours. Cardiac Enzymes: No results for input(s): CKTOTAL, CKMB, CKMBINDEX, TROPONINI in the last 168  hours. BNP (last 3 results) No results for input(s): PROBNP in the last 8760 hours. HbA1C: No results for input(s): HGBA1C in the last 72 hours. CBG: No results for input(s): GLUCAP in the last 168 hours. Lipid Profile: No results for input(s): CHOL, HDL, LDLCALC, TRIG, CHOLHDL, LDLDIRECT in the last 72 hours. Thyroid Function Tests: No results for input(s): TSH, T4TOTAL, FREET4, T3FREE, THYROIDAB in the last 72 hours. Anemia Panel: No results for input(s): VITAMINB12, FOLATE, FERRITIN, TIBC, IRON, RETICCTPCT in the last 72 hours. Urine analysis:    Component Value Date/Time   COLORURINE YELLOW 11/30/2018 1835   APPEARANCEUR HAZY (A) 11/30/2018 1835   LABSPEC 1.012 11/30/2018 1835   PHURINE 5.0 11/30/2018 1835   GLUCOSEU NEGATIVE 11/30/2018 1835   HGBUR NEGATIVE 11/30/2018 1835   BILIRUBINUR NEGATIVE 11/30/2018 1835   KETONESUR NEGATIVE 11/30/2018 1835   PROTEINUR NEGATIVE 11/30/2018 1835   UROBILINOGEN 1.0 12/05/2009 1303   NITRITE NEGATIVE 11/30/2018 [place] 11/30/2018 1835   Sepsis Labs: @LABRCNTIP (procalcitonin:4,lacticidven:4)  ) Recent Results (from the past 240 hour(s))  Surgical PCR screen     Status: None   Collection Time: 12/04/18  1:04 PM  Result Value Ref Range Status   MRSA, PCR NEGATIVE NEGATIVE Final   Staphylococcus aureus NEGATIVE NEGATIVE Final    Comment: (NOTE) The Xpert SA Assay (FDA approved for NASAL specimens in patients 35 years of age and older), is one component of a comprehensive surveillance program. It is not intended to diagnose infection nor to guide or monitor treatment. Performed at [facility], [place] [address] [place], [place] [address]       Radiology Studies: Dg Ankle Complete Left  Result Date: 12/06/2018 CLINICAL DATA:  Open reduction and internal fixation of left ankle fracture. EXAM: DG C-ARM 61-120 MIN; LEFT ANKLE COMPLETE - 3+ VIEW FLUOROSCOPY TIME:  52 seconds. COMPARISON:  Radiographs of  May 30, 2019. FINDINGS: Five intraoperative fluoroscopic images of the left ankle demonstrate the patient be status post surgical internal fixation of distal left fibular fracture and medial malleolar fracture. IMPRESSION: Status post surgical internal fixation of medial malleolar and left fibular fractures. Electronically Signed   By: Marijo Conception, M.D.   On: 12/06/2018 14:25   Dg C-arm 1-60 Min  Result Date: 12/06/2018 CLINICAL DATA:  Open reduction and internal fixation of left ankle fracture. EXAM: DG C-ARM 61-120 MIN; LEFT ANKLE  COMPLETE - 3+ VIEW FLUOROSCOPY TIME:  52 seconds. COMPARISON:  Radiographs of May 30, 2019. FINDINGS: Five intraoperative fluoroscopic images of the left ankle demonstrate the patient be status post surgical internal fixation of distal left fibular fracture and medial malleolar fracture. IMPRESSION: Status post surgical internal fixation of medial malleolar and left fibular fractures. Electronically Signed   By: Marijo Conception, M.D.   On: 12/06/2018 14:25     Scheduled Meds: . sodium chloride   Intravenous Once  . acetaminophen  650 mg Oral Q6H  . amiodarone  200 mg Oral Daily  . calcitRIOL  0.5 mcg Oral Daily  . carvedilol  25 mg Oral BID WC  . docusate sodium  100 mg Oral BID  . hydrALAZINE  25 mg Oral BID  . Melatonin  9 mg Oral QHS  . polyethylene glycol  17 g Oral BID  . senna  2 tablet Oral QHS  . umeclidinium-vilanterol  1 puff Inhalation Daily   Continuous Infusions: . lactated ringers Stopped (12/07/18 1106)     LOS: 5 days   Time Spent in minutes   30 minutes  Lavren Lewan D.O. on 12/07/2018 at 11:46 AM  Between 7am to 7pm - Please see pager noted on [email or web address]  After 7pm go to [email or web address]  And look for the night coverage person covering for me after hours  [facility] Office  [phone]

## 2018-12-08 LAB — BASIC METABOLIC PANEL
Anion gap: 6 (ref 5–15)
BUN: 29 mg/dL — AB (ref 8–23)
CO2: 29 mmol/L (ref 22–32)
CREATININE: 1.66 mg/dL — AB (ref 0.44–1.00)
Calcium: 9.9 mg/dL (ref 8.9–10.3)
Chloride: 100 mmol/L (ref 98–111)
GFR calc Af Amer: 34 mL/min — ABNORMAL LOW (ref >60)
GFR calc non Af Amer: 29 mL/min — ABNORMAL LOW (ref >60)
Glucose, Bld: 113 mg/dL — ABNORMAL HIGH (ref 70–99)
Potassium: 4 mmol/L (ref 3.5–5.1)
Sodium: 135 mmol/L (ref 135–145)

## 2018-12-08 LAB — HEMOGLOBIN AND HEMATOCRIT, BLOOD
HCT: 24.9 % — ABNORMAL LOW (ref 36.0–46.0)
Hemoglobin: 7.8 g/dL — ABNORMAL LOW (ref 12.0–15.0)

## 2018-12-08 NOTE — Progress Notes (Signed)
PROGRESS NOTE    Diane [name] [facility]  VEL:[id number] DOB: [birth year] DOA: 11/29/2018 PCP: [name], MD   Brief Narrative:  HPI on 11/29/2018 by Dr. Fuller Plan Diane [name] is a 78 y.o. female with medical history significant of COPD on 2 L of nasal cannula oxygen with activity, diastolic CHF, A. fib on Eliquis, CAD, MVP, HTN, HLD, and lung cancer s/p chemo followed by [name]; who presents after having a fall at home.  Patient reports that she had just gotten back from the beauty shop and was coming up the steps.  She put her hand on the windowsill as she normally does to help get up the steps, but her hand slipped.  Patient fell backwards hitting her head on the concrete and twisting her left ankle.  She did not lose consciousness, but reported excruciating pain in swelling of the left ankle.  Her husband with whom she lives was unable to help her up and called EMS.  Patient reports that otherwise she has no complaints.  Denies fever, chills, chest pain,  Interim history Admitted with left ankle fracture, orthopedics consulted.  Plan for surgery today 12/06/2018. Also found to have AKI and placed on IVF.  Assessment & Plan   Fall with left ankle fracture -patient fell at home onto the concrete -X-rays revealed trimalleolar, distal tibia and fibular fractures -Patient underwent close reduction in the emergency department -Orthopedic surgery consulted and appreciated.  Dr. [name] initially recommended outpatient follow-up, however patient was admitted as she was unable to ambulate.  PT recommending SNF. -s/p Open treatment of left ankle fracture with internal fixation.Trimalleolar w/o fixation of posterior malleolus CPT 27822.  -Orthopedics recommending NWB LLE for 6 weeks -Continue pain control -Eliquis restarted on 12/07/2018  Acute kidney injury on chronic kidney disease, stage IV -Creatinine peaked to 3.19, however appears to be improving, currently 1.66 (baseline around  2) -Renal ultrasound showed no acute findings or hydronephrosis -UA unremarkable for infection -Continue to avoid nephrotoxic agents -placed on IVF  -Monitor BMP  Anemia, likely of chronic disease/ Iron def -Hemoglobin dropped to 7.7 (upon review of patient's chart, her baseline hemoglobin ranges from 8-10) -Upon admission, patient did have a hemoglobin of 11.5 however drop may be dilutional as patient was receiving IV fluid -No active signs of bleeding -anemia panel on 12/01/2018 showed iron 16- given dose of Feraheme (oral supplementation held given constipation) -Transfused 1u PRBCs on 12/06/2018 -hemoglobin 7.8 -continue to monitor H/H as Eliquis restarted on 12/07/2018  Low-grade fever -Resolved, has been afebrile for several days -suspect secondary to the above  Thrombocytopenia -Resolved, transient-unknown etiology -Continue to monitor CBC  Persistent atrial fibrillation -CHADSVASC score 6 -Rate controlled -Continue pacerone, eliquis  Chronic diastolic heart failure -Patient appears to be compensated and euvolemic at this time -Echocardiogram 12/12/2017 showed an EF of 65 to 70% -Continue torsemide -Continue to monitor intake and output, daily weights  COPD -Without acute exacerbation -Patient uses home oxygen of 2 L with activity -Continue home medications as well as nebulizer treatments as needed  Adenocarcinoma of the lung -Followed by oncology, [name]  Essential hypertension -Continue hydralazine, Coreg  Hypokalemia -Resolved with replacement -Continue to monitor BMP  Left upper extremity swelling -Patient states that she may have fallen but does not remember.  She is able to move her wrist without eliciting pain.  Patient also received IV fluid and blood transfusion -Will obtain US -doubt DVT given that patient has been on Eliquis (held for 2 days  for surgery) -Elevate arm -Continue to monitor closely  Constipation -Continue bowel regimen, iron  held  Morbid obesity -BMI 41.6 -Patient will need to discuss lifestyle modifications including diet exercise etc. with PCP on discharge  DVT Prophylaxis Eliquis  Code Status: Full  Family Communication: None at bedside  Disposition Plan: Admitted.  Pending SNF- suspect 12/09/2018  Consultants Orthopedic surgery  Procedures  Renal ultrasound Open treatment of left ankle fracture with internal fixation.Trimalleolar w/o fixation of posterior malleolus CPT 27822.   Antibiotics   Anti-infectives (From admission, onward)   Start     Dose/Rate Route Frequency Ordered Stop   12/06/18 1900  ceFAZolin (ANCEF) IVPB 2g/100 mL premix     2 g 200 mL/hr over 30 Minutes Intravenous Every 6 hours 12/06/18 1519 12/07/18 0700   12/06/18 1200  ceFAZolin (ANCEF) IVPB 2g/100 mL premix     2 g 200 mL/hr over 30 Minutes Intravenous On call to O.R. 12/06/18 1112 12/06/18 1335   12/06/18 1113  ceFAZolin (ANCEF) 2-4 GM/100ML-% IVPB    Note to Pharmacy:  [facility]   : cabinet override      12/06/18 1113 12/06/18 1305      Subjective:   Diane [name] seen and examined today.  Continues to complain of left arm swelling but states she is able to move her arm and wrist without problem.  Denies current chest pain, shortness of breath, abdominal pain, nausea or vomiting, diarrhea or constipation, dizziness or headache.   Objective:   Vitals:   12/07/18 2041 12/08/18 0500 12/08/18 0628 12/08/18 0957  BP: (!) 131/58  (!) 134/58   Pulse: 83  78   Resp:      Temp: 98.4 F (36.9 C)  98.2 F (36.8 C)   TempSrc: Oral  Oral   SpO2: 97%  99% 98%  Weight:  133.4 kg    Height:        Intake/Output Summary (Last 24 hours) at 12/08/2018 1034 Last data filed at 12/08/2018 0800 Gross per 24 hour  Intake 508.02 ml  Output 500 ml  Net 8.02 ml   Filed Weights   12/06/18 0450 12/06/18 1129 12/08/18 0500  Weight: 133.3 kg 133.3 kg 133.4 kg   Exam  General: Well developed, well nourished, NAD,  appears stated age  HEENT: NCAT, mucous membranes moist.   Neck: Supple  Cardiovascular: S1 S2 auscultated, RRR, no murmur  Respiratory: Clear to auscultation bilaterally with equal chest rise  Abdomen: Soft, obese, nontender, nondistended, + bowel sounds  Extremities: warm dry without cyanosis clubbing or edema in lower extremities.  LLE splint in place.  LUE edema  Neuro: AAOx3, nonfocal  Psych: Appropriate mood and affect, pleasant  Data Reviewed: I have personally reviewed following labs and imaging studies  CBC: Recent Labs  Lab 12/02/18 0408 12/03/18 0344 12/04/18 0339 12/05/18 0404 12/05/18 2030 12/06/18 0222 12/07/18 0348 12/08/18 0418  WBC 6.2 5.8 6.2  --   --  5.4  --   --   HGB 8.2* 7.9* 8.1* 7.6* 7.9* 7.7* 8.2* 7.8*  HCT 26.7* 25.3* 25.5* 24.7* 26.3* 25.7* 26.1* 24.9*  MCV 99.6 98.8 99.2  --   --  100.8*  --   --   PLT 146* 149* 166  --   --  176  --   --    Basic Metabolic Panel: Recent Labs  Lab 12/02/18 0408 12/03/18 0344 12/04/18 0339 12/05/18 0404 12/06/18 0222 12/07/18 0348 12/08/18 0418  NA 136 137 136 137 135 135  135  K 4.0 3.9 3.8 3.8 3.7 4.6 4.0  CL 103 103 105 104 104 103 100  CO2 27 27 27 27 26 26 29   GLUCOSE 124* 107* 101* 109* 107* 127* 113*  BUN 36* 27* 23 23 23  24* 29*  CREATININE 2.46* 2.02* 1.79* 1.68* 1.70* 1.60* 1.66*  CALCIUM 9.2 9.5 9.7 9.6 10.1 10.0 9.9  MG 2.3 2.2 2.4  --   --   --   --    GFR: Estimated Creatinine Clearance: 38.6 mL/min (A) (by C-G formula based on SCr of 1.66 mg/dL (H)). Liver Function Tests: Recent Labs  Lab 12/02/18 0408 12/03/18 0344  AST 14* 13*  ALT 11 10  ALKPHOS 34* 30*  BILITOT 0.6 0.9  PROT 6.1* 5.8*  ALBUMIN 2.6* 2.3*   No results for input(s): LIPASE, AMYLASE in the last 168 hours. No results for input(s): AMMONIA in the last 168 hours. Coagulation Profile: No results for input(s): INR, PROTIME in the last 168 hours. Cardiac Enzymes: No results for input(s): CKTOTAL, CKMB,  CKMBINDEX, TROPONINI in the last 168 hours. BNP (last 3 results) No results for input(s): PROBNP in the last 8760 hours. HbA1C: No results for input(s): HGBA1C in the last 72 hours. CBG: No results for input(s): GLUCAP in the last 168 hours. Lipid Profile: No results for input(s): CHOL, HDL, LDLCALC, TRIG, CHOLHDL, LDLDIRECT in the last 72 hours. Thyroid Function Tests: No results for input(s): TSH, T4TOTAL, FREET4, T3FREE, THYROIDAB in the last 72 hours. Anemia Panel: No results for input(s): VITAMINB12, FOLATE, FERRITIN, TIBC, IRON, RETICCTPCT in the last 72 hours. Urine analysis:    Component Value Date/Time   COLORURINE YELLOW 11/30/2018 1835   APPEARANCEUR HAZY (A) 11/30/2018 1835   LABSPEC 1.012 11/30/2018 1835   PHURINE 5.0 11/30/2018 1835   GLUCOSEU NEGATIVE 11/30/2018 1835   HGBUR NEGATIVE 11/30/2018 1835   BILIRUBINUR NEGATIVE 11/30/2018 1835   KETONESUR NEGATIVE 11/30/2018 1835   PROTEINUR NEGATIVE 11/30/2018 1835   UROBILINOGEN 1.0 12/05/2009 1303   NITRITE NEGATIVE 11/30/2018 [place] 11/30/2018 1835   Sepsis Labs: @LABRCNTIP (procalcitonin:4,lacticidven:4)  ) Recent Results (from the past 240 hour(s))  Surgical PCR screen     Status: None   Collection Time: 12/04/18  1:04 PM  Result Value Ref Range Status   MRSA, PCR NEGATIVE NEGATIVE Final   Staphylococcus aureus NEGATIVE NEGATIVE Final    Comment: (NOTE) The Xpert SA Assay (FDA approved for NASAL specimens in patients 76 years of age and older), is one component of a comprehensive surveillance program. It is not intended to diagnose infection nor to guide or monitor treatment. Performed at [facility], [place] [address], [place], [place] [address]       Radiology Studies: Dg Ankle Complete Left  Result Date: 12/06/2018 CLINICAL DATA:  Open reduction and internal fixation of left ankle fracture. EXAM: DG C-ARM 61-120 MIN; LEFT ANKLE COMPLETE - 3+ VIEW FLUOROSCOPY TIME:  52  seconds. COMPARISON:  Radiographs of May 30, 2019. FINDINGS: Five intraoperative fluoroscopic images of the left ankle demonstrate the patient be status post surgical internal fixation of distal left fibular fracture and medial malleolar fracture. IMPRESSION: Status post surgical internal fixation of medial malleolar and left fibular fractures. Electronically Signed   By: Marijo Conception, M.D.   On: 12/06/2018 14:25   Dg C-arm 1-60 Min  Result Date: 12/06/2018 CLINICAL DATA:  Open reduction and internal fixation of left ankle fracture. EXAM: DG C-ARM 61-120 MIN; LEFT ANKLE COMPLETE -  3+ VIEW FLUOROSCOPY TIME:  52 seconds. COMPARISON:  Radiographs of May 30, 2019. FINDINGS: Five intraoperative fluoroscopic images of the left ankle demonstrate the patient be status post surgical internal fixation of distal left fibular fracture and medial malleolar fracture. IMPRESSION: Status post surgical internal fixation of medial malleolar and left fibular fractures. Electronically Signed   By: Marijo Conception, M.D.   On: 12/06/2018 14:25     Scheduled Meds: . sodium chloride   Intravenous Once  . acetaminophen  650 mg Oral Q6H  . amiodarone  200 mg Oral Daily  . apixaban  5 mg Oral BID  . calcitRIOL  0.5 mcg Oral Daily  . carvedilol  25 mg Oral BID WC  . docusate sodium  100 mg Oral BID  . hydrALAZINE  25 mg Oral BID  . Melatonin  9 mg Oral QHS  . polyethylene glycol  17 g Oral BID  . senna  2 tablet Oral QHS  . torsemide  40 mg Oral BID WC  . umeclidinium-vilanterol  1 puff Inhalation Daily   Continuous Infusions: . lactated ringers Stopped (12/07/18 1106)     LOS: 6 days   Time Spent in minutes   30 minutes  Devan Danzer D.O. on 12/08/2018 at 10:34 AM  Between 7am to 7pm - Please see pager noted on [email or web address]  After 7pm go to [email or web address]  And look for the night coverage person covering for me after hours  [facility] Office  [phone]

## 2018-12-08 NOTE — Progress Notes (Signed)
Diane [name] [facility]  MRN: [id number] DOB/Age: 79-Oct-1942 79 y.o. Physician: Ander Slade, M.D. 2 Days Post-Op Procedure(s) (LRB): OPEN REDUCTION INTERNAL FIXATION (ORIF) ANKLE FRACTURE (Left)  Subjective: Patient reports pain is improved today.  Reports continued difficulties with mobility.  Does note swelling in the left arm, however this has improved modestly since yesterday according to the patient. Vital Signs Temp:  [97.5 F (36.4 C)-98.4 F (36.9 C)] 98.2 F (36.8 C) (01/12 0628) Pulse Rate:  [78-83] 78 (01/12 0628) Resp:  [18] 18 (01/11 1100) BP: (131-152)/(57-62) 134/58 (01/12 0628) SpO2:  [97 %-99 %] 99 % (01/12 0628) Weight:  [133.4 kg] 133.4 kg (01/12 0500)  Lab Results Recent Labs    12/06/18 0222 12/07/18 0348 12/08/18 0418  WBC 5.4  --   --   HGB 7.7* 8.2* 7.8*  HCT 25.7* 26.1* 24.9*  PLT 176  --   --    BMET Recent Labs    12/07/18 0348 12/08/18 0418  NA 135 135  K 4.6 4.0  CL 103 100  CO2 26 29  GLUCOSE 127* 113*  BUN 24* 29*  CREATININE 1.60* 1.66*  CALCIUM 10.0 9.9   INR  Date Value Ref Range Status  11/29/2018 1.44  Final    Comment:    Performed at [facility], [place] [address], [place], [place] [address]     Exam  Left lower extremity demonstrates a well-padded splint in position.  Good digital motion.  She reports less pain today.  Generalized swelling of the left arm is noted.  Ecchymosis about the elbow from a previous blood draw site.  Compartments are soft.  Neurovascular intact.  Plan Status post ORIF left ankle fracture  Left arm swelling, by patient's report and ultrasound is pending.  Today's exam shows good wrist and digital motion and by patient's report the overall level of swelling has improved modestly.  I have discussed with her edema reduction techniques.  Discharge planning for SNF [facility] [place] 12/08/2018, 9:19 AM    Contact # [phone]

## 2018-12-09 ENCOUNTER — Inpatient Hospital Stay (HOSPITAL_COMMUNITY): Payer: Medicare Other

## 2018-12-09 DIAGNOSIS — R609 Edema, unspecified: Secondary | ICD-10-CM

## 2018-12-09 LAB — HEMOGLOBIN AND HEMATOCRIT, BLOOD
HCT: 26.2 % — ABNORMAL LOW (ref 36.0–46.0)
HEMOGLOBIN: 7.9 g/dL — AB (ref 12.0–15.0)

## 2018-12-09 NOTE — Plan of Care (Signed)
  Problem: Activity: Goal: Risk for activity intolerance will decrease Outcome: Progressing   Problem: Elimination: Goal: Will not experience complications related to bowel motility Outcome: Progressing   

## 2018-12-09 NOTE — Progress Notes (Signed)
Occupational Therapy Treatment Patient Details Name: Diane [name] MRN: [id number] DOB: [birth year] Today's Date: 12/09/2018    History of present illness Pt is a 78 y.o. female with PMH of COPD (2L O2 home), CHF, A. fib on Eliquis, CAD, HTN, HLD, and lung CA s/p chemo, admitted 11/29/18 after fall sustaning L ankle fx. Now s/p L ankle ORIF 12/06/18.    OT comments  Pt is not progressing toward OT goals. Pt will benefit from co treatment with PT to reduce fatigue and to build endurance to engage in multiple therapy sessions. Pt unable to engage in transfer to toilet or chair due to increased pain and fatigue. Therapist incorporated LUE exercises and edema management due to edema upon arrival. Pt educated on purpose of exercises, retrograde massage, and positioning to be utilized and carried over to next setting. Next session to address transfer to toilet or chair for toileting/ clothing management or LB dressing with use of AE. Pt recommended to continue D/C to SNF setting.    Follow Up Recommendations  SNF;Supervision/Assistance - 24 hour    Equipment Recommendations  Other (comment)(defer to next venue of care)    Recommendations for Other Services      Precautions / Restrictions Precautions Precautions: Fall Restrictions Weight Bearing Restrictions: Yes LLE Weight Bearing: Non weight bearing       Mobility Bed Mobility Overal bed mobility: Needs Assistance                Transfers Overall transfer level: Needs assistance Equipment used: Sliding board Transfers: Lateral/Scoot Transfers Sit to Stand: Max assist;+2 physical assistance;From elevated surface        Lateral/Scoot Transfers: Max assist;+2 physical assistance;With slide board General transfer comment: MaxA+2 for slide board transfer from [facility] to recliner; consistent cueing for sequencing, safety, and LLE NWB. Pt with difficulty assisting the scoot with BUE support, requiring maxA+2 to [place] assist from  therapist. Pt fatiguing quickly, having to lay back on recliner, requiring 3+ assist to scoot up/reposition    Balance                                           ADL either performed or assessed with clinical judgement   ADL Overall ADL's : Needs assistance/impaired Eating/Feeding: Set up Eating/Feeding Details (indicate cue type and reason): in bed Grooming: Set up;Sitting;Wash/dry hands;Wash/dry face Grooming Details (indicate cue type and reason): in bed Upper Body Bathing: Moderate assistance       Upper Body Dressing : Set up                   Functional mobility during ADLs: Maximal assistance;+2 for physical assistance;+2 for safety/equipment(lateral scoot transfer only)       Vision       Perception     Praxis      Cognition Arousal/Alertness: Awake/alert Behavior During Therapy: WFL for tasks assessed/performed Overall Cognitive Status: Within Functional Limits for tasks assessed                                          Exercises Exercises: Hand exercises;General Upper Extremity General Exercises - Upper Extremity Shoulder Flexion: AROM;Left;10 reps Elbow Flexion: AROM;Left;10 reps Elbow Extension: AROM;Left;10 reps Wrist Flexion: AROM;Left;10 reps Wrist Extension: AROM;Left;10 reps Digit Composite Flexion: AROM;Left;10 reps  Chair Push Up: (unable to significantly move body, isometric) General Exercises - Lower Extremity Long Arc Quad: (AROm RLE, AAROM LLE) Hip Flexion/Marching: (AROM RLe, AAROM LLE) Hand Exercises Wrist Ulnar Deviation: AROM;Left;10 reps Wrist Radial Deviation: AROM;10 reps;Left   Shoulder Instructions       General Comments Due to increased edema of LUE (hand and wrist specifically) pt received retrograde massage and positioning LUE to elevation to reduce edema.    Pertinent Vitals/ Pain       Pain Assessment: Faces Faces Pain Scale: Hurts even more Pain Location: LLE Pain Descriptors  / Indicators: Aching;Grimacing;Guarding Pain Intervention(s): Limited activity within patient's tolerance;Ice applied  Home Living                                          Prior Functioning/Environment              Frequency  Min 2X/week        Progress Toward Goals  OT Goals(current goals can now be found in the care plan section)  Progress towards OT goals: Not progressing toward goals - comment  Acute Rehab OT Goals Patient Stated Goal: home when able to walk OT Goal Formulation: With patient Time For Goal Achievement: 12/21/18 Potential to Achieve Goals: Good ADL Goals Pt Will Perform Lower Body Bathing: with set-up;with adaptive equipment;sitting/lateral leans Pt Will Perform Lower Body Dressing: with max assist;sit to/from stand Pt Will Transfer to Toilet: with min assist;stand pivot transfer;bedside commode Pt Will Perform Toileting - Clothing Manipulation and hygiene: with min guard assist;sitting/lateral leans Pt/caregiver will Perform Home Exercise Program: Left upper extremity;Independently Additional ADL Goal #1: Pt will perform bed mobility at supervision level prior to engaging in ADL  Plan Discharge plan remains appropriate;Frequency remains appropriate    Co-evaluation    PT/OT/SLP Co-Evaluation/Treatment: Yes            AM-PAC OT "6 Clicks" Daily Activity     Outcome Measure   Help from another person eating meals?: A Little Help from another person taking care of personal grooming?: A Little Help from another person toileting, which includes using toliet, bedpan, or urinal?: A Lot Help from another person bathing (including washing, rinsing, drying)?: A Lot Help from another person to put on and taking off regular upper body clothing?: A Little Help from another person to put on and taking off regular lower body clothing?: Total 6 Click Score: 14    End of Session Equipment Utilized During Treatment: Oxygen(2 increased to  3L)  OT Visit Diagnosis: Other abnormalities of gait and mobility (R26.89);History of falling (Z91.81);Pain Pain - Right/Left: Left Pain - part of body: Ankle and joints of foot   Activity Tolerance Patient limited by fatigue;Patient limited by pain   Patient Left Other (comment);with call bell/phone within reach;in bed   Nurse Communication Mobility status        Time: 6767-2094 OT Time Calculation (min): 14 min  Charges: OT General Charges $OT Visit: 1 Visit OT Treatments $Therapeutic Exercise: 8-22 mins  Minus Breeding, MSOT, OTR/L  Supplemental Rehabilitation Services  [phone]   Marius Ditch 12/09/2018, 4:06 PM

## 2018-12-09 NOTE — Progress Notes (Signed)
Left upper extremity venous duplex exam completed. Please see preliminary notes on CV PROC under chart review. Diane [name] H Kami Kube(RDMS RVT) 12/09/18 1:21 PM

## 2018-12-09 NOTE — Progress Notes (Signed)
PROGRESS NOTE    Diane [name] [facility]  RKY:[id number] DOB: [birth year] DOA: 11/29/2018 PCP: [name], MD   Brief Narrative:  HPI on 11/29/2018 by Dr. Fuller Plan Diane [name] is a 78 y.o. female with medical history significant of COPD on 2 L of nasal cannula oxygen with activity, diastolic CHF, A. fib on Eliquis, CAD, MVP, HTN, HLD, and lung cancer s/p chemo followed by [name]; who presents after having a fall at home.  Patient reports that she had just gotten back from the beauty shop and was coming up the steps.  She put her hand on the windowsill as she normally does to help get up the steps, but her hand slipped.  Patient fell backwards hitting her head on the concrete and twisting her left ankle.  She did not lose consciousness, but reported excruciating pain in swelling of the left ankle.  Her husband with whom she lives was unable to help her up and called EMS.  Patient reports that otherwise she has no complaints.  Denies fever, chills, chest pain,  Interim history Admitted with left ankle fracture, orthopedics consulted.  Plan for surgery today 12/06/2018. Also found to have AKI, placed on IVF, resolved. Now pending SNF placement. Watching anemia. Assessment & Plan   Fall with left ankle fracture -patient fell at home onto the concrete -X-rays revealed trimalleolar, distal tibia and fibular fractures -Patient underwent close reduction in the emergency department -Orthopedic surgery consulted and appreciated.  Dr. [name] initially recommended outpatient follow-up, however patient was admitted as she was unable to ambulate.  PT recommending SNF. -s/p Open treatment of left ankle fracture with internal fixation.Trimalleolar w/o fixation of posterior malleolus CPT 27822.  -Orthopedics recommending NWB LLE for 6 weeks -Continue pain control -Eliquis restarted on 12/07/2018  Acute kidney injury on chronic kidney disease, stage IV -Creatinine peaked to 3.19, however appears to  be improving, currently 1.66 (baseline around 2) -Renal ultrasound showed no acute findings or hydronephrosis -UA unremarkable for infection -Continue to avoid nephrotoxic agents -placed on IVF  -Monitor BMP  Anemia, likely of chronic disease/ Iron def -Hemoglobin dropped to 7.7 (upon review of patient's chart, her baseline hemoglobin ranges from 8-10) -Upon admission, patient did have a hemoglobin of 11.5 however drop may be dilutional as patient was receiving IV fluid -No active signs of bleeding -anemia panel on 12/01/2018 showed iron 16- given dose of Feraheme (oral supplementation held given constipation) -Transfused 1u PRBCs on 12/06/2018 -hemoglobin 7.9 -continue to monitor H/H as Eliquis restarted on 12/07/2018  Low-grade fever -Resolved, has been afebrile for several days -suspect secondary to the above  Thrombocytopenia -Resolved, transient-unknown etiology -Continue to monitor CBC  Persistent atrial fibrillation -CHADSVASC score 6 -Rate controlled -Continue pacerone, eliquis  Chronic diastolic heart failure -Patient appears to be compensated and euvolemic at this time -Echocardiogram 12/12/2017 showed an EF of 65 to 70% -Continue torsemide -Continue to monitor intake and output, daily weights  COPD -Without acute exacerbation -Patient uses home oxygen of 2 L with activity -Continue home medications as well as nebulizer treatments as needed  Adenocarcinoma of the lung -Followed by oncology, [name]  Essential hypertension -Continue hydralazine, Coreg  Hypokalemia -Resolved with replacement -Continue to monitor BMP  Left upper extremity swelling -Patient states that she may have fallen but does not remember.  She is able to move her wrist without eliciting pain.  Patient also received IV fluid and blood transfusion -pending LUE [name] -doubt DVT given that patient has been on  Eliquis (held for 2 days for surgery) -Continue to Elevate arm -Continue to  monitor closely  Constipation -Continue bowel regimen, iron held  Morbid obesity -BMI 41.6 -Patient will need to discuss lifestyle modifications including diet exercise etc. with PCP on discharge  DVT Prophylaxis Eliquis  Code Status: Full  Family Communication: None at bedside  Disposition Plan: Admitted.  Pending SNF  Consultants Orthopedic surgery  Procedures  Renal ultrasound Open treatment of left ankle fracture with internal fixation.Trimalleolar w/o fixation of posterior malleolus CPT 27822.   Antibiotics   Anti-infectives (From admission, onward)   Start     Dose/Rate Route Frequency Ordered Stop   12/06/18 1900  ceFAZolin (ANCEF) IVPB 2g/100 mL premix     2 g 200 mL/hr over 30 Minutes Intravenous Every 6 hours 12/06/18 1519 12/07/18 0700   12/06/18 1200  ceFAZolin (ANCEF) IVPB 2g/100 mL premix     2 g 200 mL/hr over 30 Minutes Intravenous On call to O.R. 12/06/18 1112 12/06/18 1335   12/06/18 1113  ceFAZolin (ANCEF) 2-4 GM/100ML-% IVPB    Note to Pharmacy:  [facility]   : cabinet override      12/06/18 1113 12/06/18 1305      Subjective:   Diane [name] seen and examined today.  Continues to have left arm swelling but feels that it is improved.  Able to move her arm and wrist without any pain or restrictions.  Denies current chest pain, shortness of breath, abdominal pain, nausea or vomiting, diarrhea or constipation, dizziness or headache.  Objective:   Vitals:   12/08/18 1452 12/08/18 2145 12/09/18 0459 12/09/18 0923  BP: (!) 129/54 (!) 132/51 (!) 147/55   Pulse: 75 78 77   Resp:   14   Temp: 98.5 F (36.9 C) 98.2 F (36.8 C) 98.3 F (36.8 C)   TempSrc: Oral Oral Oral   SpO2: 100% 99% 100% 98%  Weight:      Height:        Intake/Output Summary (Last 24 hours) at 12/09/2018 1103 Last data filed at 12/08/2018 1700 Gross per 24 hour  Intake 480 ml  Output -  Net 480 ml   Filed Weights   12/06/18 0450 12/06/18 1129 12/08/18 0500  Weight:  133.3 kg 133.3 kg 133.4 kg   Exam  General: Well developed, well nourished, NAD, appears stated age  63: NCAT,mucous membranes moist.   Neck: Supple  Cardiovascular: S1 S2 auscultated, RRR, no murmur  Respiratory: Clear to auscultation bilaterally with equal chest rise  Abdomen: Soft, obese, nontender, nondistended, + bowel sounds  Extremities: warm dry without cyanosis clubbing or edema.  LLE in splint.  LUE edema  Neuro: AAOx3, nonfocal  Psych: Appropriate mood and affect  Data Reviewed: I have personally reviewed following labs and imaging studies  CBC: Recent Labs  Lab 12/03/18 0344 12/04/18 0339  12/05/18 2030 12/06/18 0222 12/07/18 0348 12/08/18 0418 12/09/18 0407  WBC 5.8 6.2  --   --  5.4  --   --   --   HGB 7.9* 8.1*   < > 7.9* 7.7* 8.2* 7.8* 7.9*  HCT 25.3* 25.5*   < > 26.3* 25.7* 26.1* 24.9* 26.2*  MCV 98.8 99.2  --   --  100.8*  --   --   --   PLT 149* 166  --   --  176  --   --   --    < > = values in this interval not displayed.   Basic Metabolic  Panel: Recent Labs  Lab 12/03/18 0344 12/04/18 0339 12/05/18 0404 12/06/18 0222 12/07/18 0348 12/08/18 0418  NA 137 136 137 135 135 135  K 3.9 3.8 3.8 3.7 4.6 4.0  CL 103 105 104 104 103 100  CO2 27 27 27 26 26 29   GLUCOSE 107* 101* 109* 107* 127* 113*  BUN 27* 23 23 23  24* 29*  CREATININE 2.02* 1.79* 1.68* 1.70* 1.60* 1.66*  CALCIUM 9.5 9.7 9.6 10.1 10.0 9.9  MG 2.2 2.4  --   --   --   --    GFR: Estimated Creatinine Clearance: 38.6 mL/min (A) (by C-G formula based on SCr of 1.66 mg/dL (H)). Liver Function Tests: Recent Labs  Lab 12/03/18 0344  AST 13*  ALT 10  ALKPHOS 30*  BILITOT 0.9  PROT 5.8*  ALBUMIN 2.3*   No results for input(s): LIPASE, AMYLASE in the last 168 hours. No results for input(s): AMMONIA in the last 168 hours. Coagulation Profile: No results for input(s): INR, PROTIME in the last 168 hours. Cardiac Enzymes: No results for input(s): CKTOTAL, CKMB, CKMBINDEX,  TROPONINI in the last 168 hours. BNP (last 3 results) No results for input(s): PROBNP in the last 8760 hours. HbA1C: No results for input(s): HGBA1C in the last 72 hours. CBG: No results for input(s): GLUCAP in the last 168 hours. Lipid Profile: No results for input(s): CHOL, HDL, LDLCALC, TRIG, CHOLHDL, LDLDIRECT in the last 72 hours. Thyroid Function Tests: No results for input(s): TSH, T4TOTAL, FREET4, T3FREE, THYROIDAB in the last 72 hours. Anemia Panel: No results for input(s): VITAMINB12, FOLATE, FERRITIN, TIBC, IRON, RETICCTPCT in the last 72 hours. Urine analysis:    Component Value Date/Time   COLORURINE YELLOW 11/30/2018 1835   APPEARANCEUR HAZY (A) 11/30/2018 1835   LABSPEC 1.012 11/30/2018 1835   PHURINE 5.0 11/30/2018 1835   GLUCOSEU NEGATIVE 11/30/2018 1835   HGBUR NEGATIVE 11/30/2018 1835   BILIRUBINUR NEGATIVE 11/30/2018 1835   KETONESUR NEGATIVE 11/30/2018 1835   PROTEINUR NEGATIVE 11/30/2018 1835   UROBILINOGEN 1.0 12/05/2009 1303   NITRITE NEGATIVE 11/30/2018 [place] 11/30/2018 1835   Sepsis Labs: @LABRCNTIP (procalcitonin:4,lacticidven:4)  ) Recent Results (from the past 240 hour(s))  Surgical PCR screen     Status: None   Collection Time: 12/04/18  1:04 PM  Result Value Ref Range Status   MRSA, PCR NEGATIVE NEGATIVE Final   Staphylococcus aureus NEGATIVE NEGATIVE Final    Comment: (NOTE) The Xpert SA Assay (FDA approved for NASAL specimens in patients 15 years of age and older), is one component of a comprehensive surveillance program. It is not intended to diagnose infection nor to guide or monitor treatment. Performed at [facility], [place] [address] [place], [place] [address]       Radiology Studies: No results found.   Scheduled Meds: . sodium chloride   Intravenous Once  . acetaminophen  650 mg Oral Q6H  . amiodarone  200 mg Oral Daily  . apixaban  5 mg Oral BID  . calcitRIOL  0.5 mcg Oral Daily  .  carvedilol  25 mg Oral BID WC  . docusate sodium  100 mg Oral BID  . hydrALAZINE  25 mg Oral BID  . Melatonin  9 mg Oral QHS  . polyethylene glycol  17 g Oral BID  . senna  2 tablet Oral QHS  . torsemide  40 mg Oral BID WC  . umeclidinium-vilanterol  1 puff Inhalation Daily   Continuous Infusions: . lactated  ringers Stopped (12/07/18 1106)     LOS: 7 days   Time Spent in minutes   30 minutes  Diane [name] D.O. on 12/09/2018 at 11:03 AM  Between 7am to 7pm - Please see pager noted on [email or web address]  After 7pm go to [email or web address]  And look for the night coverage person covering for me after hours  [facility] Office  [phone]

## 2018-12-09 NOTE — Plan of Care (Signed)

## 2018-12-09 NOTE — Progress Notes (Signed)
Physical Therapy Treatment Patient Details Name: Diane [name] MRN: [id number] DOB: 1941-04-26 Today's Date: 12/09/2018    History of Present Illness Pt is a 78 y.o. female with PMH of COPD (2L O2 home), CHF, A. fib on Eliquis, CAD, HTN, HLD, and lung CA s/p chemo, admitted 11/29/18 after fall sustaning L ankle fx. Now s/p L ankle ORIF 12/06/18.     PT Comments    Continuing work on functional mobility and activity tolerance;  Functional mobility is still difficult for Diane [name], especially with transfers due to difficulty un weighing hips for scooting; Ultrasound arrived for L UE venous duplex exam, and therefor we ended session with Diane [name] back in bed in prep for the exam; continue to recommend post-acute rehab at SNF  Follow Up Recommendations  SNF;Supervision/Assistance - 24 hour     Equipment Recommendations  Wheelchair (measurements PT)    Recommendations for Other Services       Precautions / Restrictions Precautions Precautions: Fall Restrictions Weight Bearing Restrictions: Yes LLE Weight Bearing: Non weight bearing    Mobility  Bed Mobility Overal bed mobility: Needs Assistance Bed Mobility: Supine to Sit;Sit to Supine     Supine to sit: Mod assist;HOB elevated Sit to supine: Max assist;+2 for physical assistance   General bed mobility comments: Cues for sequence and tecnhnique; Mod assist to elevate trunk to sit and used bed pad to square off hips at EOB  Transfers Overall transfer level: Needs assistance Equipment used: Sliding board Transfers: Lateral/Scoot Transfers Sit to Stand: Max assist;+2 physical assistance;From elevated surface        Lateral/Scoot Transfers: Max assist;+2 physical assistance;With slide board General transfer comment: Simulated lateral scoot transfer by working on scoot to [facility]; cues for wiehgt shift, to push through R foot, but ultimately had lots of difficulty unweighing hips to scoot and required +2 Max  assist  Ambulation/Gait             General Gait Details: Unable   Stairs             Wheelchair Mobility    Modified Rankin (Stroke Patients Only)       Balance     Sitting balance-Leahy Scale: Fair                                      Cognition Arousal/Alertness: Awake/alert Behavior During Therapy: WFL for tasks assessed/performed Overall Cognitive Status: Within Functional Limits for tasks assessed                                        Exercises General Exercises - Upper Extremity Shoulder Flexion: AROM;Left;10 reps Elbow Flexion: AROM;Left;10 reps Elbow Extension: AROM;Left;10 reps Wrist Flexion: AROM;Left;10 reps Wrist Extension: AROM;Left;10 reps Digit Composite Flexion: AROM;Left;10 reps Chair Push Up: (unable to significantly move body, isometric) General Exercises - Lower Extremity Long Arc Quad: (AROm RLE, AAROM LLE) Hip Flexion/Marching: (AROM RLe, AAROM LLE) Hand Exercises Wrist Ulnar Deviation: AROM;Left;10 reps Wrist Radial Deviation: AROM;10 reps;Left    General Comments General comments (skin integrity, edema, etc.): Due to increased edema of LUE (hand and wrist specifically) pt received retrograde massage and positioning LUE to elevation to reduce edema.      Pertinent Vitals/Pain Pain Assessment: Faces Faces Pain Scale: Hurts even more Pain Location: LLE Pain Descriptors / Indicators:  Aching;Grimacing;Guarding Pain Intervention(s): Monitored during session    Home Living                      Prior Function            PT Goals (current goals can now be found in the care plan section) Acute Rehab PT Goals Patient Stated Goal: home when able to walk PT Goal Formulation: With patient Time For Goal Achievement: 12/14/18 Potential to Achieve Goals: Fair Progress towards PT goals: Progressing toward goals    Frequency    Min 3X/week      PT Plan Current plan remains  appropriate    Co-evaluation              AM-PAC PT "6 Clicks" Mobility   Outcome Measure  Help needed turning from your back to your side while in a flat bed without using bedrails?: A Lot Help needed moving from lying on your back to sitting on the side of a flat bed without using bedrails?: A Lot Help needed moving to and from a bed to a chair (including a wheelchair)?: A Lot Help needed standing up from a chair using your arms (e.g., wheelchair or bedside chair)?: Total Help needed to walk in hospital room?: Total Help needed climbing 3-5 steps with a railing? : Total 6 Click Score: 9    End of Session Equipment Utilized During Treatment: Oxygen Activity Tolerance: Patient limited by fatigue;Other (comment)(and Ultrasound in) Patient left: in bed;with call bell/phone within reach Nurse Communication: Mobility status;Need for lift equipment PT Visit Diagnosis: Other abnormalities of gait and mobility (R26.89);Pain;Muscle weakness (generalized) (M62.81) Pain - Right/Left: Left Pain - part of body: Ankle and joints of foot     Time: 7847-8412 PT Time Calculation (min) (ACUTE ONLY): 24 min  Charges:  $Therapeutic Activity: 23-37 mins                     Roney Marion, PT  Acute Rehabilitation Services Pager [phone] Office [place] 12/09/2018, 4:47 PM

## 2018-12-10 ENCOUNTER — Encounter (HOSPITAL_COMMUNITY): Payer: Self-pay | Admitting: Orthopedic Surgery

## 2018-12-10 LAB — BPAM RBC
Blood Product Expiration Date: 202001282359
ISSUE DATE / TIME: 202001070854
Unit Type and Rh: 6200

## 2018-12-10 LAB — TYPE AND SCREEN
ABO/RH(D): A POS
Antibody Screen: NEGATIVE
Unit division: 0

## 2018-12-10 NOTE — Progress Notes (Signed)
PROGRESS NOTE    Ranie Chinchilla [facility]  QPY:[id number] DOB: 12-12-1940 DOA: 11/29/2018 PCP: [name], MD   Brief Narrative:  HPI on 11/29/2018 by Dr. Fuller Plan Diane [name] is a 78 y.o. female with medical history significant of COPD on 2 L of nasal cannula oxygen with activity, diastolic CHF, A. fib on Eliquis, CAD, MVP, HTN, HLD, and lung cancer s/p chemo followed by [name]; who presents after having a fall at home.  Patient reports that she had just gotten back from the beauty shop and was coming up the steps.  She put her hand on the windowsill as she normally does to help get up the steps, but her hand slipped.  Patient fell backwards hitting her head on the concrete and twisting her left ankle.  She did not lose consciousness, but reported excruciating pain in swelling of the left ankle.  Her husband with whom she lives was unable to help her up and called EMS.  Patient reports that otherwise she has no complaints.  Denies fever, chills, chest pain,  Interim history Admitted with left ankle fracture, orthopedics consulted.  Plan for surgery today 12/06/2018. Also found to have AKI, placed on IVF, resolved. Now pending SNF placement. Watching anemia. Assessment & Plan   Fall with left ankle fracture -patient fell at home onto the concrete -X-rays revealed trimalleolar, distal tibia and fibular fractures -Patient underwent close reduction in the emergency department -Orthopedic surgery consulted and appreciated.  Dr. [name] initially recommended outpatient follow-up, however patient was admitted as she was unable to ambulate.  PT recommending SNF. -s/p Open treatment of left ankle fracture with internal fixation.Trimalleolar w/o fixation of posterior malleolus CPT 27822.  -Orthopedics recommending NWB LLE for 6 weeks -Continue pain control -Eliquis restarted on 12/07/2018  Acute kidney injury on chronic kidney disease, stage IV -Creatinine peaked to 3.19, however appears to  be improving, currently 1.66 (baseline around 2) -Renal ultrasound showed no acute findings or hydronephrosis -UA unremarkable for infection -Continue to avoid nephrotoxic agents -placed on IVF  -Monitor BMP  Anemia, likely of chronic disease/ Iron def -Hemoglobin dropped to 7.7 (upon review of patient's chart, her baseline hemoglobin ranges from 8-10) -Upon admission, patient did have a hemoglobin of 11.5 however drop may be dilutional as patient was receiving IV fluid -No active signs of bleeding -anemia panel on 12/01/2018 showed iron 16- given dose of Feraheme (oral supplementation held given constipation) -Transfused 1u PRBCs on 12/06/2018 -hemoglobin 7.9 -continue to monitor H/H as Eliquis restarted on 12/07/2018  Low-grade fever -Resolved, has been afebrile for several days -suspect secondary to the above  Thrombocytopenia -Resolved, transient-unknown etiology -Continue to monitor CBC  Persistent atrial fibrillation -CHADSVASC score 6 -Rate controlled -Continue pacerone, eliquis  Chronic diastolic heart failure -Patient appears to be compensated and euvolemic at this time -Echocardiogram 12/12/2017 showed an EF of 65 to 70% -Continue torsemide -Continue to monitor intake and output, daily weights  COPD -Without acute exacerbation -Patient uses home oxygen of 2 L with activity -Continue home medications as well as nebulizer treatments as needed  Adenocarcinoma of the lung -Followed by oncology, [name]  Essential hypertension -Continue hydralazine, Coreg  Hypokalemia -Resolved with replacement -Continue to monitor BMP  Left upper extremity swelling -improved -Patient states that she may have fallen but does not remember.  She is able to move her wrist without eliciting pain.  Patient also received IV fluid and blood transfusion -pending LUE [name] -doubt DVT given that patient has been  on Eliquis (held for 2 days for surgery) -Continue to Elevate  arm -Continue to monitor closely  Constipation -Continue bowel regimen, iron held  Morbid obesity -BMI 41.6 -Patient will need to discuss lifestyle modifications including diet exercise etc. with PCP on discharge  DVT Prophylaxis Eliquis  Code Status: Full  Family Communication: None at bedside  Disposition Plan: Admitted.  Pending SNF- insurance auth.  Patient will need to be at SNF as she will be nonweightbearing on the left lower extremity for 6 weeks post surgery.  Consultants Orthopedic surgery  Procedures  Renal ultrasound Open treatment of left ankle fracture with internal fixation.Trimalleolar w/o fixation of posterior malleolus CPT 27822.   Antibiotics   Anti-infectives (From admission, onward)   Start     Dose/Rate Route Frequency Ordered Stop   12/06/18 1900  ceFAZolin (ANCEF) IVPB 2g/100 mL premix     2 g 200 mL/hr over 30 Minutes Intravenous Every 6 hours 12/06/18 1519 12/07/18 0700   12/06/18 1200  ceFAZolin (ANCEF) IVPB 2g/100 mL premix     2 g 200 mL/hr over 30 Minutes Intravenous On call to O.R. 12/06/18 1112 12/06/18 1335   12/06/18 1113  ceFAZolin (ANCEF) 2-4 GM/100ML-% IVPB    Note to Pharmacy:  [facility]   : cabinet override      12/06/18 1113 12/06/18 1305      Subjective:   Sallyanne Kuster seen and examined today.  Patient has no complaints today.  Denies any further edema in the left arm.  Denies current chest pain, shortness breath, abdominal pain, nausea vomiting, diarrhea constipation, dizziness or headache. Objective:   Vitals:   12/09/18 2022 12/10/18 0342 12/10/18 0732 12/10/18 1316  BP: (!) 153/56 (!) 145/57  (!) 137/58  Pulse: 81 75  79  Resp: 18 18    Temp: 98.5 F (36.9 C) 98 F (36.7 C)  98.3 F (36.8 C)  TempSrc: Oral Oral  Oral  SpO2: 98% 97% 96% 100%  Weight:      Height:        Intake/Output Summary (Last 24 hours) at 12/10/2018 1534 Last data filed at 12/10/2018 1300 Gross per 24 hour  Intake 1742.83 ml   Output 1500 ml  Net 242.83 ml   Filed Weights   12/06/18 0450 12/06/18 1129 12/08/18 0500  Weight: 133.3 kg 133.3 kg 133.4 kg   Exam  General: Well developed, well nourished, NAD, appears stated age  50: NCAT, mucous membranes moist.   Neck: Supple  Cardiovascular: S1 S2 auscultated, no rubs, murmurs or gallops. Regular rate and rhythm.  Respiratory: Clear to auscultation bilaterally with equal chest rise  Abdomen: Soft, obese, nontender, nondistended, + bowel sounds  Extremities: warm dry without cyanosis clubbing. RLE edema. LLE in splint. LUE edema improved  Neuro: AAOx3, nonfocal  Psych: Appropriate mood and affect  Data Reviewed: I have personally reviewed following labs and imaging studies  CBC: Recent Labs  Lab 12/04/18 0339  12/05/18 2030 12/06/18 0222 12/07/18 0348 12/08/18 0418 12/09/18 0407  WBC 6.2  --   --  5.4  --   --   --   HGB 8.1*   < > 7.9* 7.7* 8.2* 7.8* 7.9*  HCT 25.5*   < > 26.3* 25.7* 26.1* 24.9* 26.2*  MCV 99.2  --   --  100.8*  --   --   --   PLT 166  --   --  176  --   --   --    < > =  values in this interval not displayed.   Basic Metabolic Panel: Recent Labs  Lab 12/04/18 0339 12/05/18 0404 12/06/18 0222 12/07/18 0348 12/08/18 0418  NA 136 137 135 135 135  K 3.8 3.8 3.7 4.6 4.0  CL 105 104 104 103 100  CO2 27 27 26 26 29   GLUCOSE 101* 109* 107* 127* 113*  BUN 23 23 23  24* 29*  CREATININE 1.79* 1.68* 1.70* 1.60* 1.66*  CALCIUM 9.7 9.6 10.1 10.0 9.9  MG 2.4  --   --   --   --    GFR: Estimated Creatinine Clearance: 38.6 mL/min (A) (by C-G formula based on SCr of 1.66 mg/dL (H)). Liver Function Tests: No results for input(s): AST, ALT, ALKPHOS, BILITOT, PROT, ALBUMIN in the last 168 hours. No results for input(s): LIPASE, AMYLASE in the last 168 hours. No results for input(s): AMMONIA in the last 168 hours. Coagulation Profile: No results for input(s): INR, PROTIME in the last 168 hours. Cardiac Enzymes: No results  for input(s): CKTOTAL, CKMB, CKMBINDEX, TROPONINI in the last 168 hours. BNP (last 3 results) No results for input(s): PROBNP in the last 8760 hours. HbA1C: No results for input(s): HGBA1C in the last 72 hours. CBG: No results for input(s): GLUCAP in the last 168 hours. Lipid Profile: No results for input(s): CHOL, HDL, LDLCALC, TRIG, CHOLHDL, LDLDIRECT in the last 72 hours. Thyroid Function Tests: No results for input(s): TSH, T4TOTAL, FREET4, T3FREE, THYROIDAB in the last 72 hours. Anemia Panel: No results for input(s): VITAMINB12, FOLATE, FERRITIN, TIBC, IRON, RETICCTPCT in the last 72 hours. Urine analysis:    Component Value Date/Time   COLORURINE YELLOW 11/30/2018 1835   APPEARANCEUR HAZY (A) 11/30/2018 1835   LABSPEC 1.012 11/30/2018 1835   PHURINE 5.0 11/30/2018 1835   GLUCOSEU NEGATIVE 11/30/2018 1835   HGBUR NEGATIVE 11/30/2018 1835   BILIRUBINUR NEGATIVE 11/30/2018 1835   KETONESUR NEGATIVE 11/30/2018 1835   PROTEINUR NEGATIVE 11/30/2018 1835   UROBILINOGEN 1.0 12/05/2009 1303   NITRITE NEGATIVE 11/30/2018 [place] 11/30/2018 1835   Sepsis Labs: @LABRCNTIP (procalcitonin:4,lacticidven:4)  ) Recent Results (from the past 240 hour(s))  Surgical PCR screen     Status: None   Collection Time: 12/04/18  1:04 PM  Result Value Ref Range Status   MRSA, PCR NEGATIVE NEGATIVE Final   Staphylococcus aureus NEGATIVE NEGATIVE Final    Comment: (NOTE) The Xpert SA Assay (FDA approved for NASAL specimens in patients 49 years of age and older), is one component of a comprehensive surveillance program. It is not intended to diagnose infection nor to guide or monitor treatment. Performed at [facility], [place] [address], [place], [place] [address]       Radiology Studies: Vas [name] Upper Extremity Venous Duplex  Result Date: 12/09/2018 UPPER VENOUS STUDY  Indications: Edema, Pain, and discoloration Risk Factors: Cancer Lung cancer obesity.  Limitations: Body habitus, line and Immobility. Comparison Study: No comparison study available Performing Technologist: Rudell Cobb  Examination Guidelines: A complete evaluation includes B-mode imaging, spectral Doppler, color Doppler, and power Doppler as needed of all accessible portions of each vessel. Bilateral testing is considered an integral part of a complete examination. Limited examinations for reoccurring indications may be performed as noted.  Right Findings: +----------+------------+----------+---------+-----------+-------+ RIGHT     CompressiblePropertiesPhasicitySpontaneousSummary +----------+------------+----------+---------+-----------+-------+ Subclavian    Full                 Yes       Yes            +----------+------------+----------+---------+-----------+-------+  Left Findings: +----------+------------+----------+---------+-----------+---------+ LEFT      CompressiblePropertiesPhasicitySpontaneous Summary  +----------+------------+----------+---------+-----------+---------+ IJV           Full                 Yes       Yes    poor flow +----------+------------+----------+---------+-----------+---------+ Subclavian    Full                 Yes       Yes              +----------+------------+----------+---------+-----------+---------+ Axillary      Full                 Yes       Yes              +----------+------------+----------+---------+-----------+---------+ Brachial      Full                           Yes              +----------+------------+----------+---------+-----------+---------+ Radial        Full                                            +----------+------------+----------+---------+-----------+---------+ Ulnar         Full                                            +----------+------------+----------+---------+-----------+---------+ Cephalic      Full                                             +----------+------------+----------+---------+-----------+---------+ Basilic       Full                                            +----------+------------+----------+---------+-----------+---------+  Summary:  Right: No evidence of thrombosis in the subclavian.  Left: No evidence of deep vein thrombosis in the upper extremity. No evidence of superficial vein thrombosis in the upper extremity. No evidence of thrombosis in the subclavian.  *See table(s) above for measurements and observations.  Diagnosing physician: Deitra Mayo MD Electronically signed by Deitra Mayo MD on 12/09/2018 at 5:10:07 PM.    Final      Scheduled Meds: . sodium chloride   Intravenous Once  . acetaminophen  650 mg Oral Q6H  . amiodarone  200 mg Oral Daily  . apixaban  5 mg Oral BID  . calcitRIOL  0.5 mcg Oral Daily  . carvedilol  25 mg Oral BID WC  . docusate sodium  100 mg Oral BID  . hydrALAZINE  25 mg Oral BID  . Melatonin  9 mg Oral QHS  . polyethylene glycol  17 g Oral BID  . senna  2 tablet Oral QHS  . torsemide  40 mg Oral BID WC  . umeclidinium-vilanterol  1 puff Inhalation Daily   Continuous Infusions: . lactated ringers 10 mL/hr at 12/09/18 1741  LOS: 8 days   Time Spent in minutes   30 minutes  Harry Bark D.O. on 12/10/2018 at 3:34 PM  Between 7am to 7pm - Please see pager noted on [email or web address]  After 7pm go to [email or web address]  And look for the night coverage person covering for me after hours  [facility] Office  [phone]

## 2018-12-11 LAB — BASIC METABOLIC PANEL
Anion gap: 6 (ref 5–15)
BUN: 36 mg/dL — ABNORMAL HIGH (ref 8–23)
CO2: 35 mmol/L — ABNORMAL HIGH (ref 22–32)
CREATININE: 2.07 mg/dL — AB (ref 0.44–1.00)
Calcium: 9.3 mg/dL (ref 8.9–10.3)
Chloride: 99 mmol/L (ref 98–111)
GFR calc Af Amer: 26 mL/min — ABNORMAL LOW (ref >60)
GFR calc non Af Amer: 22 mL/min — ABNORMAL LOW (ref >60)
Glucose, Bld: 113 mg/dL — ABNORMAL HIGH (ref 70–99)
Potassium: 3.4 mmol/L — ABNORMAL LOW (ref 3.5–5.1)
Sodium: 140 mmol/L (ref 135–145)

## 2018-12-11 LAB — HEMOGLOBIN AND HEMATOCRIT, BLOOD
HEMATOCRIT: 25.7 % — AB (ref 36.0–46.0)
Hemoglobin: 8.1 g/dL — ABNORMAL LOW (ref 12.0–15.0)

## 2018-12-11 MED ORDER — DOCUSATE SODIUM 100 MG PO CAPS: 100.0000 mg | ORAL_CAPSULE | Freq: Two times a day (BID) | ORAL | 0 refills | Status: DC

## 2018-12-11 MED ORDER — POTASSIUM CHLORIDE CRYS ER 20 MEQ PO TBCR
40.0000 meq | EXTENDED_RELEASE_TABLET | Freq: Once | ORAL | Status: AC
  Administered 2018-12-11: 40 meq via ORAL
  Filled 2018-12-11: qty 2

## 2018-12-11 MED ORDER — OXYCODONE HCL 5 MG PO TABS: 5.0000 mg | ORAL_TABLET | ORAL | 0 refills | Status: DC | PRN

## 2018-12-11 MED ORDER — HEPARIN SOD (PORK) LOCK FLUSH 100 UNIT/ML IV SOLN
500.0000 [IU] | INTRAVENOUS | Status: AC | PRN
  Administered 2018-12-11: 500 [IU]

## 2018-12-11 NOTE — Progress Notes (Signed)
[facility] to: [facility] Anticipated DC date: 12/11/2018 Family notified:[place] Transport by: Corey Harold  Per MD patient ready for DC to [facility] . RN, patient, patient's family, and facility notified of DC. Discharge Summary sent to facility. RN given number for report [phone]. DC packet on chart. Ambulance transport requested for patient.  CSW signing off.  [place], [place]

## 2018-12-11 NOTE — Discharge Summary (Signed)
Physician Discharge Summary  Diane [name] [facility] GGE:[id number] DOB: 06-Sep-1941 DOA: 11/29/2018  PCP: Diane Downing, MD  Admit date: 11/29/2018 Discharge date: 12/11/2018  Time spent: 45 minutes  Recommendations for Outpatient Follow-up:  Patient will be discharged to skilled nursing facility, continue physical.  Patient will need to follow up with primary care provider within one week of discharge, repeat BMP and CBC. Follow up with Diane [name], orthopedics.  Patient should continue medications as prescribed.  Patient should follow a heart healthy diet.   Discharge Diagnoses:  Principal problem: Fall with left ankle fracture Acute kidney injury on chronic kidney disease, stage IV Anemia, likely of chronic disease/ Iron def Low-grade fever Thrombocytopenia Persistent atrial fibrillation Chronic diastolic heart failure COPD Adenocarcinoma of the lung Essential hypertension Hypokalemia Left upper extremity swelling Constipation Morbid obesity  Discharge Condition: Stable  Diet recommendation: healthy healthy  Filed Weights   12/06/18 0450 12/06/18 1129 12/08/18 0500  Weight: 133.3 kg 133.3 kg 133.4 kg    History of present illness:  on 11/29/2018 by Diane [name] a 78 y.o.femalewith medical history significant ofCOPD on2L of nasal cannula oxygen with activity, diastolic CHF, A. fib on Eliquis, CAD,MVP,HTN, HLD,andlung cancers/p chemofollowed by Diane [name] presents after having a fall at home. Patient reports that she had just gotten back from the beauty shop and was coming up the steps. She put her hand on the windowsill as she normally does to help get up the steps, but her hand slipped. Patient fell backwards hitting her head on the concrete and twisting her left ankle. She did not lose consciousness, but reported excruciating pain in swelling of the left ankle. Her husband with whom she lives was unable to help her up and called EMS.  Patient reports that otherwise she has no complaints. Denies fever, chills, chest pain,  Hospital Course:  Fall with left ankle fracture -patient fell at home onto the concrete -X-rays revealed trimalleolar, distal tibia and fibular fractures -Patient underwent close reduction in the emergency department -Orthopedic surgery consulted and appreciated.  Diane [name] initially recommended outpatient follow-up, however patient was admitted as she was unable to ambulate.  PT recommending SNF. -s/p Open treatment ofleftankle fracture with internal fixation.Trimalleolar w/o fixation of posterior malleolus CPT 27822.  -Orthopedics recommending [facility]B LLE for 6 weeks -Continue pain control -Eliquis restarted on 12/07/2018  Acute kidney injury on chronic kidney disease, stage IV -Resolved -Creatinine peaked to 3.19, currently 2.07 baseline around 2) -Renal ultrasound showed no acute findings or hydronephrosis -UA unremarkable for infection -Continue to avoid nephrotoxic agents -placed on IVF   Anemia, likely of chronic disease/ Iron def -Hemoglobin dropped to 7.7 (upon review of patient's chart, her baseline hemoglobin ranges from 8-10) -Upon admission, patient did have a hemoglobin of 11.5 however drop may be dilutional as patient was receiving IV fluid -No active signs of bleeding -anemia panel on 12/01/2018 showed iron 16- given dose of Feraheme (oral supplementation held given constipation) -Transfused 1u PRBCs on 12/06/2018 -hemoglobin 8.1 -Repeat CBC in one week, follow up with oncology  Low-grade fever -Resolved, has been afebrile for several days -suspect secondary to the above  Thrombocytopenia -Resolved, transient-unknown etiology  Persistent atrial fibrillation -CHADSVASC score 6 -Rate controlled -Continue pacerone, eliquis  Chronic diastolic heart failure -Patient appears to be compensated and euvolemic at this time -Echocardiogram 12/12/2017 showed an EF of 65 to  70% -Continue torsemide -Continue to monitor intake and output, daily weights  COPD -Without acute exacerbation -Patient uses home  oxygen of 2 L with activity -Continue home medications as well as nebulizer treatments as needed  Adenocarcinoma of the lung -Followed by oncology, Diane [name]  Essential hypertension -Continue hydralazine, Coreg  Hypokalemia -Resolved with replacement -Continue to monitor BMP  Left upper extremity swelling -Resolved -Patient states that she may have fallen but does not remember.  She is able to move her wrist without eliciting pain.  Patient also received IV fluid and blood transfusion -pending LUE [name] -doubt DVT given that patient has been on Eliquis (held for 2 days for surgery)  Constipation -Continue bowel regimen, iron held  Morbid obesity -BMI 41.6 -Patient will need to discuss lifestyle modifications including diet exercise etc. with PCP on discharge  Consultants Orthopedic surgery  Procedures  Renal ultrasound Open treatment ofleftankle fracture with internal fixation.Trimalleolar w/o fixation of posterior malleolus CPT 27822.   Discharge Exam: Vitals:   12/11/18 0448 12/11/18 0908  BP: (!) 149/52   Pulse: 79   Resp: 16   Temp: 98.4 F (36.9 C)   SpO2: 99% 98%     General: Well developed, well nourished, NAD, appears stated age  HEENT: NCAT, mucous membranes moist.  Neck: Supple  Cardiovascular: S1 S2 auscultated, no murmur, RRR  Respiratory: Clear to auscultation bilaterally with equal chest rise  Abdomen: Soft, nontender, nondistended, + bowel sounds  Extremities: warm dry without cyanosis clubbing. RLE edema. LLE in splint. LUE edema resolved.   Neuro: AAOx3, nonfocal  Psych: Appropriate mood and affect   Discharge Instructions Discharge Instructions    Discharge instructions   Complete by:  As directed    Patient will be discharged to skilled nursing facility, continue physical.  Patient will  need to follow up with primary care provider within one week of discharge, repeat BMP and CBC. Follow up with Diane [name], orthopedics.  Patient should continue medications as prescribed.  Patient should follow a heart healthy diet.     Allergies as of 12/11/2018      Reactions   Septra Ds [sulfamethoxazole-trimethoprim] Nausea Only      Medication List    TAKE these medications   acetaminophen 500 MG tablet Commonly known as:  TYLENOL Take 1,000 mg by mouth at bedtime.   albuterol 108 (90 Base) MCG/ACT inhaler Commonly known as:  PROAIR HFA Inhale 2 puffs into the lungs every 4 (four) hours as needed for wheezing or shortness of breath.   amiodarone 200 MG tablet Commonly known as:  PACERONE Take 1 tablet (200 mg total) by mouth daily.   ANORO ELLIPTA 62.5-25 MCG/INH Aepb Generic drug:  umeclidinium-vilanterol INHALE 1 PUFF BY MOUTH INTO LUNGS ONCE DAILY What changed:  See the new instructions.   apixaban 5 MG Tabs tablet Commonly known as:  ELIQUIS Take 1 tablet (5 mg total) by mouth 2 (two) times daily. OK to restart on Friday 05/10/18.   calcitRIOL 0.5 MCG capsule Commonly known as:  ROCALTROL Take 0.5 mcg by mouth daily.   carvedilol 25 MG tablet Commonly known as:  COREG Take 1 tablet (25 mg total) by mouth 2 (two) times daily with a meal.   cyanocobalamin 1000 MCG/ML injection Commonly known as:  (VITAMIN B-12) Inject 1,000 mcg into the muscle every 30 (thirty) days.   docusate sodium 100 MG capsule Commonly known as:  COLACE Take 1 capsule (100 mg total) by mouth 2 (two) times daily.   hydrALAZINE 25 MG tablet Commonly known as:  APRESOLINE Take 1 tablet (25 mg total) by mouth 2 (two) times  daily.   hydroxypropyl methylcellulose / hypromellose 2.5 % ophthalmic solution Commonly known as:  ISOPTO TEARS / GONIOVISC Place 1 drop into both eyes 2 (two) times daily as needed for dry eyes.   Melatonin 10 MG Tabs Take 10 mg by mouth daily.   oxyCODONE 5 MG  immediate release tablet Commonly known as:  Oxy IR/ROXICODONE Take 1 tablet (5 mg total) by mouth every 4 (four) hours as needed for moderate pain.   polyethylene glycol packet Commonly known as:  MIRALAX / GLYCOLAX Take 17 g by mouth at bedtime.   torsemide 20 MG tablet Commonly known as:  DEMADEX Take 2 tablets (40 mg total) by mouth 2 (two) times daily with a meal.      Allergies  Allergen Reactions  . Septra Ds [Sulfamethoxazole-Trimethoprim] Nausea Only    Contact information for follow-up providers    Nicholes Stairs, MD In 2 weeks.   Specialty:  Orthopedic Surgery Why:  For suture removal Contact information: [address] [place] [address] [phone]        Diane Downing, MD. Schedule an appointment as soon as possible for a visit in 1 week(s).   Specialty:  Family Medicine Why:  Hospital follow up Contact information: [place] [address] [phone]        Leonie Man, MD .   Specialty:  Cardiology Contact information: [address] [place] [address] [phone]            Contact information for after-discharge care    Destination    [facility] .   Service:  Skilled Nursing Contact information: [address] [place] [phone]                   The results of significant diagnostics from this hospitalization (including imaging, microbiology, ancillary and laboratory) are listed below for reference.    Significant Diagnostic Studies: Dg Tibia/fibula Left  Result Date: 11/29/2018 CLINICAL DATA:  Left leg pain after fall. EXAM: LEFT TIBIA AND FIBULA - 2 VIEW COMPARISON:  None. FINDINGS: Severely displaced medial malleolar fracture is noted. Moderately displaced oblique fracture is seen involving distal fibula. Lateral and posterior dislocation of talus relative to tibia is noted. IMPRESSION:  Lateral and posterior dislocation of talus relative to distal tibia is noted with associated severely displaced medial malleolar fracture and moderately displaced distal fibular fracture Electronically Signed   By: Marijo Conception, M.D.   On: 11/29/2018 16:11   Dg Ankle Complete Left  Result Date: 12/06/2018 CLINICAL DATA:  Open reduction and internal fixation of left ankle fracture. EXAM: DG C-ARM 61-120 MIN; LEFT ANKLE COMPLETE - 3+ VIEW FLUOROSCOPY TIME:  52 seconds. COMPARISON:  Radiographs of May 30, 2019. FINDINGS: Five intraoperative fluoroscopic images of the left ankle demonstrate the patient be status post surgical internal fixation of distal left fibular fracture and medial malleolar fracture. IMPRESSION: Status post surgical internal fixation of medial malleolar and left fibular fractures. Electronically Signed   By: Marijo Conception, M.D.   On: 12/06/2018 14:25   Dg Ankle Complete Left  Result Date: 11/29/2018 CLINICAL DATA:  Left ankle pain after fall. EXAM: LEFT ANKLE COMPLETE - 3+ VIEW COMPARISON:  None. FINDINGS: Lateral posterior dislocation of left talus relative to distal left tibia is noted. Severely displaced medial malleolar fracture is noted. Moderately displaced and comminuted fracture is seen involving the distal left fibula. Also noted is  moderately displaced posterior malleolar fracture. IMPRESSION: Trimalleolar fracture is noted with associated lateral and posterior dislocation of talus relative to distal tibia. Electronically Signed   By: Marijo Conception, M.D.   On: 11/29/2018 16:13   Ct Head Wo Contrast  Result Date: 11/29/2018 CLINICAL DATA:  Fall.  On Eliquis.  History of lung cancer. EXAM: CT HEAD WITHOUT CONTRAST CT CERVICAL SPINE WITHOUT CONTRAST TECHNIQUE: Multidetector CT imaging of the head and cervical spine was performed following the standard protocol without intravenous contrast. Multiplanar CT image reconstructions of the cervical spine were also generated.  COMPARISON:  CT chest dated August 19, 2018. MR brain dated Apr 12, 2015. CT head dated March 16, 2011. CT neck dated August 12, 2009. FINDINGS: CT HEAD FINDINGS Brain: No evidence of acute infarction, hemorrhage, hydrocephalus, extra-axial collection or mass lesion/mass effect. Stable small lacunar infarct in the right centrum semiovale. Stable mild atrophy. Vascular: Atherosclerotic vascular calcification of the carotid siphons. No hyperdense vessel. Skull: Normal. Negative for fracture or focal lesion. Sinuses/Orbits: No acute finding. Mild ethmoid air cell mucosal thickening. Other: None. CT CERVICAL SPINE FINDINGS Alignment: Slight reversal of the normal cervical lordosis. No traumatic malalignment. Skull base and vertebrae: No acute fracture. No primary bone lesion or focal pathologic process. Soft tissues and spinal canal: No prevertebral fluid or swelling. Unchanged medial deviation of the bilateral internal carotid arteries. No visible canal hematoma. Disc levels: Mild disc height loss at C3-C4. Moderate disc height loss and mild uncovertebral hypertrophy from C4-C5 through C6-C7. Upper chest: Centrilobular emphysema. Unchanged nodular architectural distortion in the right upper lobe. Other: None. IMPRESSION: 1. No acute intracranial abnormality. Unchanged small lacunar infarct in the right centrum semiovale. 2. No acute cervical spine fracture. Moderate cervical spondylosis. Electronically Signed   By: Titus Dubin M.D.   On: 11/29/2018 17:12   Ct Cervical Spine Wo Contrast  Result Date: 11/29/2018 CLINICAL DATA:  Fall.  On Eliquis.  History of lung cancer. EXAM: CT HEAD WITHOUT CONTRAST CT CERVICAL SPINE WITHOUT CONTRAST TECHNIQUE: Multidetector CT imaging of the head and cervical spine was performed following the standard protocol without intravenous contrast. Multiplanar CT image reconstructions of the cervical spine were also generated. COMPARISON:  CT chest dated August 19, 2018. MR  brain dated Apr 12, 2015. CT head dated March 16, 2011. CT neck dated August 12, 2009. FINDINGS: CT HEAD FINDINGS Brain: No evidence of acute infarction, hemorrhage, hydrocephalus, extra-axial collection or mass lesion/mass effect. Stable small lacunar infarct in the right centrum semiovale. Stable mild atrophy. Vascular: Atherosclerotic vascular calcification of the carotid siphons. No hyperdense vessel. Skull: Normal. Negative for fracture or focal lesion. Sinuses/Orbits: No acute finding. Mild ethmoid air cell mucosal thickening. Other: None. CT CERVICAL SPINE FINDINGS Alignment: Slight reversal of the normal cervical lordosis. No traumatic malalignment. Skull base and vertebrae: No acute fracture. No primary bone lesion or focal pathologic process. Soft tissues and spinal canal: No prevertebral fluid or swelling. Unchanged medial deviation of the bilateral internal carotid arteries. No visible canal hematoma. Disc levels: Mild disc height loss at C3-C4. Moderate disc height loss and mild uncovertebral hypertrophy from C4-C5 through C6-C7. Upper chest: Centrilobular emphysema. Unchanged nodular architectural distortion in the right upper lobe. Other: None. IMPRESSION: 1. No acute intracranial abnormality. Unchanged small lacunar infarct in the right centrum semiovale. 2. No acute cervical spine fracture. Moderate cervical spondylosis. Electronically Signed   By: Titus Dubin M.D.   On: 11/29/2018 17:12   US Renal  Result Date: 12/01/2018 CLINICAL DATA:  Acute kidney injury.  No chronic kidney disease. EXAM: RENAL / URINARY TRACT ULTRASOUND COMPLETE COMPARISON:  Renal ultrasound, 12/06/2017 FINDINGS: Right Kidney: Renal measurements: 7.1 x 4.1 x 4.3 cm = volume: 66 mL. Normal parenchymal echogenicity. Diffuse cortical thinning. No masses, stones or hydronephrosis. Left Kidney: Renal measurements: 9.5 x 5.7 x 6.7 cm = volume: 189 mL. Normal parenchymal echogenicity. Mild diffuse renal cortical thinning. No  masses, stones or hydronephrosis. Bladder: Appears normal for degree of bladder distention. IMPRESSION: 1. No acute findings.  No hydronephrosis. 2. Relatively small kidneys, right greater than left with bilateral renal cortical thinning, also greater on the right. No renal masses or stones. 3. No significant change compared to the prior exam. Electronically Signed   By: Lajean Manes M.D.   On: 12/01/2018 17:10   Dg Pelvis Portable  Result Date: 11/29/2018 CLINICAL DATA:  Fall. EXAM: PORTABLE PELVIS 1-2 VIEWS COMPARISON:  None. FINDINGS: There is no evidence of pelvic fracture or diastasis. No pelvic bone lesions are seen. IMPRESSION: Negative. Electronically Signed   By: Marijo Conception, M.D.   On: 11/29/2018 16:06   Dg Chest Port 1 View  Result Date: 12/01/2018 CLINICAL DATA:  Acute kidney injury EXAM: PORTABLE CHEST 1 VIEW COMPARISON:  Portable exam 1603 hours compared to 11/29/2018 FINDINGS: LEFT jugular Port-A-Cath with tip projecting over SVC. Enlargement of cardiac silhouette. Mediastinal contours and pulmonary vascularity normal. Atherosclerotic calcifications aorta. Persistent RIGHT pleural effusion and basilar atelectasis. Minimal subsegmental atelectasis at LEFT base. No acute infiltrate or pneumothorax. IMPRESSION: Persistent RIGHT pleural effusion and RIGHT basilar atelectasis. Enlargement of cardiac silhouette with minimal LEFT basilar atelectasis. Electronically Signed   By: Lavonia Dana M.D.   On: 12/01/2018 16:14   Dg Chest Port 1 View  Result Date: 11/29/2018 CLINICAL DATA:  Fall. EXAM: PORTABLE CHEST 1 VIEW COMPARISON:  Radiograph of May 08, 2018. FINDINGS: Stable cardiomegaly. Left-sided Port-A-Cath is unchanged in position. No pneumothorax is noted. Left lung is clear. Stable right basilar opacity is noted concerning for atelectasis or scarring with associated effusion. Bony thorax is unremarkable. IMPRESSION: Stable right basilar opacity is noted concerning for atelectasis or scarring  with associated effusion. Electronically Signed   By: Marijo Conception, M.D.   On: 11/29/2018 16:09   Dg Ankle Left Port  Result Date: 11/29/2018 CLINICAL DATA:  Post reduction imaging, left ankle fractures. EXAM: PORTABLE LEFT ANKLE - 2 VIEW COMPARISON:  Pre reduction images obtained on 11/29/2018 at 1526 hours FINDINGS: Since the earlier study, there is been significant interval reduction. Talus has been mostly reduced with only 3-4 mm of lateral subluxation persisting. The distal fibula and medial malleolar fracture components have likewise been reduced with only mild residual displacement, medial malleolus of 3-4 mm. Ankle is encased in a plaster splint or cast. IMPRESSION: 1. Significant reduction of the trimalleolar fracture following closed reduction. Mild residual displacement and lateral talar subluxation of 3-4 mm persists. Electronically Signed   By: Lajean Manes M.D.   On: 11/29/2018 19:47   Dg Foot Complete Left  Result Date: 11/29/2018 CLINICAL DATA:  Left foot pain after fall. EXAM: LEFT FOOT - COMPLETE 3+ VIEW COMPARISON:  None. FINDINGS: Lateral and posterior dislocation of talus relative to distal tibia is noted. Severely displaced medial malleolar fracture is noted. Moderately displaced distal fibular fracture is noted. No other bony abnormality is noted. IMPRESSION: Distal tibial and fibular fractures are noted with lateral and posterior dislocation of talus relative to distal tibia. Electronically Signed   By: Jeneen Rinks  Murlean Caller, M.D.   On: 11/29/2018 16:12   Dg C-arm 1-60 Min  Result Date: 12/06/2018 CLINICAL DATA:  Open reduction and internal fixation of left ankle fracture. EXAM: DG C-ARM 61-120 MIN; LEFT ANKLE COMPLETE - 3+ VIEW FLUOROSCOPY TIME:  52 seconds. COMPARISON:  Radiographs of May 30, 2019. FINDINGS: Five intraoperative fluoroscopic images of the left ankle demonstrate the patient be status post surgical internal fixation of distal left fibular fracture and medial malleolar  fracture. IMPRESSION: Status post surgical internal fixation of medial malleolar and left fibular fractures. Electronically Signed   By: Marijo Conception, M.D.   On: 12/06/2018 14:25   Vas [name] Upper Extremity Venous Duplex  Result Date: 12/09/2018 UPPER VENOUS STUDY  Indications: Edema, Pain, and discoloration Risk Factors: Cancer Lung cancer obesity. Limitations: Body habitus, line and Immobility. Comparison Study: No comparison study available Performing Technologist: Rudell Cobb  Examination Guidelines: A complete evaluation includes B-mode imaging, spectral Doppler, color Doppler, and power Doppler as needed of all accessible portions of each vessel. Bilateral testing is considered an integral part of a complete examination. Limited examinations for reoccurring indications may be performed as noted.  Right Findings: +----------+------------+----------+---------+-----------+-------+ RIGHT     CompressiblePropertiesPhasicitySpontaneousSummary +----------+------------+----------+---------+-----------+-------+ Subclavian    Full                 Yes       Yes            +----------+------------+----------+---------+-----------+-------+  Left Findings: +----------+------------+----------+---------+-----------+---------+ LEFT      CompressiblePropertiesPhasicitySpontaneous Summary  +----------+------------+----------+---------+-----------+---------+ IJV           Full                 Yes       Yes    poor flow +----------+------------+----------+---------+-----------+---------+ Subclavian    Full                 Yes       Yes              +----------+------------+----------+---------+-----------+---------+ Axillary      Full                 Yes       Yes              +----------+------------+----------+---------+-----------+---------+ Brachial      Full                           Yes              +----------+------------+----------+---------+-----------+---------+  Radial        Full                                            +----------+------------+----------+---------+-----------+---------+ Ulnar         Full                                            +----------+------------+----------+---------+-----------+---------+ Cephalic      Full                                            +----------+------------+----------+---------+-----------+---------+  Basilic       Full                                            +----------+------------+----------+---------+-----------+---------+  Summary:  Right: No evidence of thrombosis in the subclavian.  Left: No evidence of deep vein thrombosis in the upper extremity. No evidence of superficial vein thrombosis in the upper extremity. No evidence of thrombosis in the subclavian.  *See table(s) above for measurements and observations.  Diagnosing physician: Deitra Mayo MD Electronically signed by Deitra Mayo MD on 12/09/2018 at 5:10:07 PM.    Final     Microbiology: Recent Results (from the past 240 hour(s))  Surgical PCR screen     Status: None   Collection Time: 12/04/18  1:04 PM  Result Value Ref Range Status   MRSA, PCR NEGATIVE NEGATIVE Final   Staphylococcus aureus NEGATIVE NEGATIVE Final    Comment: (NOTE) The Xpert SA Assay (FDA approved for NASAL specimens in patients 16 years of age and older), is one component of a comprehensive surveillance program. It is not intended to diagnose infection nor to guide or monitor treatment. Performed at [facility], [place] [address] [place], [place] [address]      Labs: Basic Metabolic Panel: Recent Labs  Lab 12/05/18 0404 12/06/18 0222 12/07/18 0348 12/08/18 0418 12/11/18 0536  NA 137 135 135 135 140  K 3.8 3.7 4.6 4.0 3.4*  CL 104 104 103 100 99  CO2 27 26 26 29  35*  GLUCOSE 109* 107* 127* 113* 113*  BUN 23 23 24* 29* 36*  CREATININE 1.68* 1.70* 1.60* 1.66* 2.07*  CALCIUM 9.6 10.1 10.0 9.9 9.3   Liver  Function Tests: No results for input(s): AST, ALT, ALKPHOS, BILITOT, PROT, ALBUMIN in the last 168 hours. No results for input(s): LIPASE, AMYLASE in the last 168 hours. No results for input(s): AMMONIA in the last 168 hours. CBC: Recent Labs  Lab 12/06/18 0222 12/07/18 0348 12/08/18 0418 12/09/18 0407 12/11/18 0536  WBC 5.4  --   --   --   --   HGB 7.7* 8.2* 7.8* 7.9* 8.1*  HCT 25.7* 26.1* 24.9* 26.2* 25.7*  MCV 100.8*  --   --   --   --   PLT 176  --   --   --   --    Cardiac Enzymes: No results for input(s): CKTOTAL, CKMB, CKMBINDEX, TROPONINI in the last 168 hours. BNP: BNP (last 3 results) Recent Labs    12/11/17 1145  BNP 206.4*    ProBNP (last 3 results) No results for input(s): PROBNP in the last 8760 hours.  CBG: No results for input(s): GLUCAP in the last 168 hours.     Signed:  Cristal Ford  [facility] 12/11/2018, 11:08 AM

## 2018-12-11 NOTE — Progress Notes (Signed)
Report called to Mindi Junker at [facility].  Samara Deist Phala Schraeder 12/11/2018 @1605 

## 2018-12-11 NOTE — Clinical Social Work Placement (Signed)
   CLINICAL SOCIAL WORK PLACEMENT  NOTE  Date:  12/11/2018  Patient Details  Name: Diane [name] MRN: [id number] Date of Birth: July 29, 1941  Clinical Social Work is seeking post-discharge placement for this patient at the [place] level of care (*CSW will initial, date and re-position this form in  chart as items are completed):      Patient/family provided with [place] Work Department's list of facilities offering this level of care within the geographic area requested by the patient (or if unable, by the patient's family).  Yes   Patient/family informed of their freedom to choose among providers that offer the needed level of care, that participate in Medicare, Medicaid or managed care program needed by the patient, have an available bed and are willing to accept the patient.      Patient/family informed of [place]'s ownership interest in [facility] and [facility], as well as of the fact that they are under no obligation to receive care at these facilities.  PASRR submitted to EDS on       PASRR number received on 12/07/18     Existing PASRR number confirmed on       FL2 transmitted to all facilities in geographic area requested by pt/family on 12/07/18     FL2 transmitted to all facilities within larger geographic area on       Patient informed that his/her managed care company has contracts with or will negotiate with certain facilities, including the following:        Yes   Patient/family informed of bed offers received.  Patient chooses bed at [facility]     Physician recommends and patient chooses bed at      Patient to be transferred to [facility] on 12/11/18.  Patient to be transferred to facility by [facility]     Patient family notified on 12/11/18 of transfer.  Name of family member notified:  Dominica Severin     PHYSICIAN       Additional Comment:    _______________________________________________ Alberteen Sam,  LCSW 12/11/2018, 3:14 PM

## 2018-12-11 NOTE — Care Management Important Message (Signed)
Important Message  Patient Details  Name: Diane [name] MRN: [id number] Date of Birth: 1941-01-20   Medicare Important Message Given:  Yes    Orbie Pyo 12/11/2018, 2:38 PM

## 2018-12-16 ENCOUNTER — Inpatient Hospital Stay: Payer: Medicare Other

## 2018-12-16 ENCOUNTER — Ambulatory Visit (HOSPITAL_COMMUNITY): Admission: RE | Admit: 2018-12-16 | Payer: Medicare Other | Source: Ambulatory Visit

## 2018-12-16 ENCOUNTER — Telehealth: Payer: Self-pay

## 2018-12-16 NOTE — Telephone Encounter (Signed)
Called patient related to "[facility]" for appt's today. Per [name], patient is currently in a "rehab facility because she broke her ankle." [name] claims she is at [facility] in [place]. Will need to reschedule CT scan. Mr. Name aware.

## 2018-12-18 ENCOUNTER — Inpatient Hospital Stay: Payer: Medicare Other | Admitting: Internal Medicine

## 2018-12-19 ENCOUNTER — Ambulatory Visit: Payer: Medicare Other | Admitting: Emergency Medicine

## 2018-12-31 ENCOUNTER — Telehealth: Payer: Self-pay | Admitting: Medical Oncology

## 2018-12-31 NOTE — Telephone Encounter (Addendum)
PEr Dr [name] I Faxed CXR report for Dr Lamonte Sakai to review and f/u with pt.

## 2018-12-31 NOTE — Telephone Encounter (Signed)
Persistent cough. CXR positive findings.  Requests f/u with [facility]. Schedule message sent.

## 2019-01-01 ENCOUNTER — Telehealth: Payer: Self-pay | Admitting: Medical Oncology

## 2019-01-01 ENCOUNTER — Telehealth: Payer: Self-pay | Admitting: Internal Medicine

## 2019-01-01 NOTE — Telephone Encounter (Signed)
On call note- pt wants to be admitted to hospital for "pneumonia". I called facility and told them pt called. Aniceto Boss said pt is on an antibiotic and being treated for pneumonia. I told her that I faxed cxr to Dr Agustina Caroli office and requested appt with Bryum per  [facility]..   I called pt and told her this information.Diane [name]

## 2019-01-01 NOTE — Telephone Encounter (Signed)
lmtcb x1 pt 

## 2019-01-01 NOTE — Telephone Encounter (Signed)
Please set her up to see either me or an APP to assess her. Thanks.

## 2019-01-01 NOTE — Telephone Encounter (Signed)
Called patient and living facility per 2/4 sch message - pt is aware and message left for facility scheduler.

## 2019-01-03 ENCOUNTER — Telehealth: Payer: Self-pay | Admitting: *Deleted

## 2019-01-03 NOTE — Telephone Encounter (Signed)
Received TC from NP @ [facility] inquiring about appt for pt to see Dr. [name]. Informed her that pt has an appt on 01/13/19 @ 10am.  Informed her that I also fax'd the facility the appt date and time as well.

## 2019-01-13 ENCOUNTER — Inpatient Hospital Stay: Payer: Medicare Other | Attending: Internal Medicine | Admitting: Internal Medicine

## 2019-01-13 ENCOUNTER — Encounter: Payer: Self-pay | Admitting: Internal Medicine

## 2019-01-13 VITALS — BP 125/52 | HR 70 | Temp 98.3°F | Resp 17 | Ht 65.0 in

## 2019-01-13 DIAGNOSIS — N183 Chronic kidney disease, stage 3 (moderate): Secondary | ICD-10-CM | POA: Diagnosis not present

## 2019-01-13 DIAGNOSIS — I480 Paroxysmal atrial fibrillation: Secondary | ICD-10-CM | POA: Insufficient documentation

## 2019-01-13 DIAGNOSIS — C3491 Malignant neoplasm of unspecified part of right bronchus or lung: Secondary | ICD-10-CM

## 2019-01-13 DIAGNOSIS — Z85118 Personal history of other malignant neoplasm of bronchus and lung: Secondary | ICD-10-CM | POA: Insufficient documentation

## 2019-01-13 DIAGNOSIS — Z9981 Dependence on supplemental oxygen: Secondary | ICD-10-CM | POA: Insufficient documentation

## 2019-01-13 DIAGNOSIS — D649 Anemia, unspecified: Secondary | ICD-10-CM | POA: Insufficient documentation

## 2019-01-13 DIAGNOSIS — Z7901 Long term (current) use of anticoagulants: Secondary | ICD-10-CM | POA: Diagnosis not present

## 2019-01-13 DIAGNOSIS — I10 Essential (primary) hypertension: Secondary | ICD-10-CM | POA: Diagnosis not present

## 2019-01-13 DIAGNOSIS — Z9221 Personal history of antineoplastic chemotherapy: Secondary | ICD-10-CM | POA: Diagnosis not present

## 2019-01-13 DIAGNOSIS — Z79899 Other long term (current) drug therapy: Secondary | ICD-10-CM | POA: Insufficient documentation

## 2019-01-13 DIAGNOSIS — J449 Chronic obstructive pulmonary disease, unspecified: Secondary | ICD-10-CM

## 2019-01-13 NOTE — Progress Notes (Signed)
[facility] [phone]   [phone]  OFFICE PROGRESS NOTE  [name], MD [place] [id number]  DIAGNOSIS: Recurrent non-small cell lung cancer, adenocarcinoma with negative EGFR and ALK mutations initially diagnosed as a stage IIA in March 2008.  PRIOR THERAPY: 1. Status post concurrent chemoradiation with weekly carboplatin and paclitaxel. Last dose was given Apr 12, 2007. 2. Status post 3 cycles of consolidation chemotherapy with docetaxel. Last dose was given July 12, 2007. 3. 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 4. Status post 39 cycles of maintenance Alimta at a dose of 500 mg/m2. Last dose was given February 25, 2010, discontinued secondary to persistent anemia, but the patient has stable disease. 5. Tarceva 150 mg by mouth daily. Status post approximately 37 months of therapy.   6. Tarceva 100 mg by mouth daily, status post 13 months of treatment.  CURRENT THERAPY: Observation.  INTERVAL HISTORY: Diane [name] 78 y.o. female returns to the clinic today for follow-up visit accompanied by her caregiver from the skilled nursing facility.  The patient broke her left ankle recently and she is currently in [facility] for rehabilitation.  She denied having any current chest pain but has a baseline shortness of breath and she is currently on home oxygen.  She denied having any nausea, vomiting, diarrhea or constipation.  She has no weight loss or night sweats.  She was supposed to have repeat lab and CT scan of the chest before this visit but unfortunately with her recent fracture she missed her appointment.  She is here for reevaluation.  MEDICAL HISTORY: Past Medical History:  Diagnosis Date  . Anemia   . Aortic atherosclerosis ([place]) 12/03/2017   Noted on CT scan  . CAD (coronary artery disease), native coronary artery 12/03/2017   Noted on  CT scan by calcification   . CHF (congestive heart failure) ([place])   . Chronic diastolic heart failure ([place]) 12/03/2017  . Dyspnea   . GERD (gastroesophageal reflux disease)   . Heart murmur   . History of blood transfusion    "related to chemo" (12/05/2017)  . Hypertension   . Hypertensive heart disease 12/03/2017  . Kidney disease, chronic, stage III (GFR 30-59 ml/min) ([place]) 12/03/2017  . Lung cancer ([place]) dx'd 02/2010  . Mitral valve prolapse   . Morbid obesity ([place]) 12/03/2017  . On home oxygen therapy   . 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 Paroxysmal atrial fibrillation ([facility])   . Persistent atrial fibrillation 12/03/2017  . Restrictive lung disease 05/23/2017    ALLERGIES:  is allergic to septra ds [sulfamethoxazole-trimethoprim].  MEDICATIONS:  Current Outpatient Medications  Medication Sig Dispense Refill  . acetaminophen (TYLENOL) 500 MG tablet Take 1,000 mg by mouth at bedtime.    Marland Kitchen albuterol (PROAIR HFA) 108 (90 Base) MCG/ACT inhaler Inhale 2 puffs into the lungs every 4 (four) hours as needed for wheezing or shortness of breath. 1 Inhaler 5  . amiodarone (PACERONE) 200 MG tablet Take 1 tablet (200 mg total) by mouth daily. 90 tablet 3  . ANORO ELLIPTA 62.5-25 MCG/INH AEPB INHALE 1 PUFF BY MOUTH INTO LUNGS ONCE DAILY (Patient taking differently: Inhale 1 puff into the lungs daily. ) 60 each 5  . apixaban (ELIQUIS) 5 MG TABS tablet Take 1 tablet (5 mg total) by mouth 2 (two) times daily. OK to restart on Friday 05/10/18. 90 tablet 3  .  calcitRIOL (ROCALTROL) 0.5 MCG capsule Take 0.5 mcg by mouth daily.   12  . carvedilol (COREG) 25 MG tablet Take 1 tablet (25 mg total) by mouth 2 (two) times daily with a meal. 180 tablet 3  . cyanocobalamin (,VITAMIN B-12,) 1000 MCG/ML injection Inject 1,000 mcg into the muscle every 30 (thirty) days.   0  . docusate sodium (COLACE) 100 MG capsule Take 1 capsule (100 mg total) by mouth 2 (two) times daily. 10 capsule 0  . hydrALAZINE  (APRESOLINE) 25 MG tablet Take 1 tablet (25 mg total) by mouth 2 (two) times daily. 180 tablet 3  . hydroxypropyl methylcellulose / hypromellose (ISOPTO TEARS / GONIOVISC) 2.5 % ophthalmic solution Place 1 drop into both eyes 2 (two) times daily as needed for dry eyes.     . Melatonin 10 MG TABS Take 10 mg by mouth daily.    Marland Kitchen oxyCODONE (OXY IR/ROXICODONE) 5 MG immediate release tablet Take 1 tablet (5 mg total) by mouth every 4 (four) hours as needed for moderate pain. 20 tablet 0  . polyethylene glycol (MIRALAX / GLYCOLAX) packet Take 17 g by mouth at bedtime.     . torsemide (DEMADEX) 20 MG tablet Take 2 tablets (40 mg total) by mouth 2 (two) times daily with a meal. 360 tablet 1   No current facility-administered medications for this visit.    Facility-Administered Medications Ordered in Other Visits  Medication Dose Route Frequency Provider Last Rate Last Dose  . sodium chloride 0.9 % injection 10 mL  10 mL Intravenous PRN Curt Bears, MD   10 mL at 10/14/13 1030    SURGICAL HISTORY:  Past Surgical History:  Procedure Laterality Date  . BIOPSY  07/16/2018   Procedure: BIOPSY;  Surgeon: Mauri Pole, MD;  Location: [facility];  Service: Endoscopy;;  . CARDIOVERSION N/A 12/06/2017   Procedure: CARDIOVERSION;  Surgeon: Jacolyn Reedy, MD;  Location: [facility] ENDOSCOPY;  Service: Cardiovascular;  Laterality: N/A;  . CATARACT EXTRACTION W/ INTRAOCULAR LENS  IMPLANT, BILATERAL Bilateral   . COLONOSCOPY WITH PROPOFOL N/A 07/16/2018   Procedure: COLONOSCOPY WITH PROPOFOL;  Surgeon: Mauri Pole, MD;  Location: [facility] ENDOSCOPY;  Service: Endoscopy;  Laterality: N/A;  . DERMABRASION  ~ 1966   "face; for acne"  . ESOPHAGOGASTRODUODENOSCOPY (EGD) WITH PROPOFOL N/A 07/16/2018   Procedure: ESOPHAGOGASTRODUODENOSCOPY (EGD) WITH PROPOFOL;  Surgeon: Mauri Pole, MD;  Location: [facility] ENDOSCOPY;  Service: Endoscopy;  Laterality: N/A;  . ORIF ANKLE FRACTURE Left 12/06/2018   Procedure:  OPEN REDUCTION INTERNAL FIXATION (ORIF) ANKLE FRACTURE;  Surgeon: Nicholes Stairs, MD;  Location: [facility];  Service: Orthopedics;  Laterality: Left;  . PAROTID GLAND TUMOR EXCISION Left   . POLYPECTOMY  07/16/2018   Procedure: POLYPECTOMY;  Surgeon: Mauri Pole, MD;  Location: [facility] ENDOSCOPY;  Service: Endoscopy;;  . PORTACATH PLACEMENT  11/03/08-[place]   tip in mid SVC-[place]. [place]  . THORACENTESIS Right   . TONSILLECTOMY    . VIDEO BRONCHOSCOPY WITH ENDOBRONCHIAL ULTRASOUND Right 05/08/2018   Procedure: VIDEO BRONCHOSCOPY WITH ENDOBRONCHIAL ULTRASOUND;  Surgeon: Collene Gobble, MD;  Location: [facility] OR;  Service: Thoracic;  Laterality: Right;    REVIEW OF SYSTEMS:  A comprehensive review of systems was negative except for: Constitutional: positive for fatigue Respiratory: positive for dyspnea on exertion   PHYSICAL EXAMINATION: General appearance: alert, cooperative, fatigued and no distress Head: Normocephalic, without obvious abnormality, atraumatic Neck: no adenopathy, no JVD, supple, symmetrical, trachea midline and thyroid not enlarged, symmetric, no tenderness/mass/nodules  Lymph nodes: Cervical, supraclavicular, and axillary nodes normal. Resp: wheezes bilaterally Back: symmetric, no curvature. ROM normal. No CVA tenderness. Cardio: regular rate and rhythm, S1, S2 normal, no murmur, click, rub or gallop GI: soft, non-tender; bowel sounds normal; no masses,  no organomegaly Extremities: extremities normal, atraumatic, no cyanosis or edema  ECOG PERFORMANCE STATUS: 1 - Symptomatic but completely ambulatory  Blood pressure (!) 125/52, pulse 70, temperature 98.3 F (36.8 C), temperature source Oral, resp. rate 17, height '5\' 5"'$  (1.651 m), SpO2 100 %.  LABORATORY DATA: Lab Results  Component Value Date   WBC 5.4 12/06/2018   HGB 8.1 (L) 12/11/2018   HCT 25.7 (L) 12/11/2018   MCV 100.8 (H) 12/06/2018   PLT 176 12/06/2018      Chemistry      Component Value Date/Time    NA 140 12/11/2018 0536   NA 140 10/01/2017 1116   K 3.4 (L) 12/11/2018 0536   K 4.2 10/01/2017 1116   CL 99 12/11/2018 0536   CL 102 05/08/2013 0851   CO2 35 (H) 12/11/2018 0536   CO2 29 10/01/2017 1116   BUN 36 (H) 12/11/2018 0536   BUN 31.7 (H) 10/01/2017 1116   CREATININE 2.07 (H) 12/11/2018 0536   CREATININE 2.06 (H) 08/19/2018 0802   CREATININE 1.8 (H) 10/01/2017 1116      Component Value Date/Time   CALCIUM 9.3 12/11/2018 0536   CALCIUM 9.5 10/01/2017 1116   ALKPHOS 30 (L) 12/03/2018 0344   ALKPHOS 71 10/01/2017 1116   AST 13 (L) 12/03/2018 0344   AST 13 (L) 08/19/2018 0802   AST 14 10/01/2017 1116   ALT 10 12/03/2018 0344   ALT 9 08/19/2018 0802   ALT 8 10/01/2017 1116   BILITOT 0.9 12/03/2018 0344   BILITOT 0.6 08/19/2018 0802   BILITOT 0.71 10/01/2017 1116       RADIOGRAPHIC STUDIES: No results found.  ASSESSMENT AND PLAN:  This is a very pleasant 78 years old white female with recurrent non-small cell lung cancer, adenocarcinoma diagnosed in March 2008 status post several chemotherapy regimens and was on treatment with Tarceva for more than 4 years. The patient is currently on observation and has no concerning complaints except for the baseline shortness of breath and she is currently on home oxygen. The patient will continue with physical therapy for her recent fracture. I recommended for the patient to have repeat CT scan of the chest in 2 months with follow-up visit at that time. She was advised to call immediately if she has any concerning symptoms in the interval.. The patient voices understanding of current disease status and treatment options and is in agreement with the current care plan. All questions were answered. The patient knows to call the clinic with any problems, questions or concerns. We can certainly see the patient much sooner if necessary.  Disclaimer: This note was dictated with voice recognition software. Similar sounding words can  inadvertently be transcribed and may not be corrected upon review.

## 2019-01-28 ENCOUNTER — Other Ambulatory Visit: Payer: Self-pay

## 2019-01-28 ENCOUNTER — Emergency Department (HOSPITAL_COMMUNITY)
Admission: EM | Admit: 2019-01-28 | Discharge: 2019-01-28 | Disposition: A | Discharge status: Home / Self Care | Payer: Medicare Other | Attending: Emergency Medicine | Admitting: Emergency Medicine

## 2019-01-28 ENCOUNTER — Encounter (HOSPITAL_COMMUNITY): Payer: Self-pay

## 2019-01-28 ENCOUNTER — Emergency Department (HOSPITAL_COMMUNITY): Payer: Medicare Other

## 2019-01-28 DIAGNOSIS — Z87891 Personal history of nicotine dependence: Secondary | ICD-10-CM | POA: Diagnosis not present

## 2019-01-28 DIAGNOSIS — J449 Chronic obstructive pulmonary disease, unspecified: Secondary | ICD-10-CM | POA: Diagnosis not present

## 2019-01-28 DIAGNOSIS — I130 Hypertensive heart and chronic kidney disease with heart failure and stage 1 through stage 4 chronic kidney disease, or unspecified chronic kidney disease: Secondary | ICD-10-CM | POA: Diagnosis not present

## 2019-01-28 DIAGNOSIS — Z79899 Other long term (current) drug therapy: Secondary | ICD-10-CM | POA: Insufficient documentation

## 2019-01-28 DIAGNOSIS — I5032 Chronic diastolic (congestive) heart failure: Secondary | ICD-10-CM | POA: Insufficient documentation

## 2019-01-28 DIAGNOSIS — N183 Chronic kidney disease, stage 3 (moderate): Secondary | ICD-10-CM | POA: Diagnosis not present

## 2019-01-28 DIAGNOSIS — R531 Weakness: Secondary | ICD-10-CM | POA: Diagnosis not present

## 2019-01-28 LAB — URINALYSIS, ROUTINE W REFLEX MICROSCOPIC
Bilirubin Urine: NEGATIVE
Glucose, UA: NEGATIVE mg/dL
Hgb urine dipstick: NEGATIVE
Ketones, ur: NEGATIVE mg/dL
Nitrite: NEGATIVE
PH: 6 (ref 5.0–8.0)
Protein, ur: NEGATIVE mg/dL
Specific Gravity, Urine: 1.008 (ref 1.005–1.030)

## 2019-01-28 LAB — CBC WITH DIFFERENTIAL/PLATELET
Abs Immature Granulocytes: 0.05 10*3/uL (ref 0.00–0.07)
Basophils Absolute: 0 10*3/uL (ref 0.0–0.1)
Basophils Relative: 0 %
Eosinophils Absolute: 0 10*3/uL (ref 0.0–0.5)
Eosinophils Relative: 1 %
HCT: 34.3 % — ABNORMAL LOW (ref 36.0–46.0)
Hemoglobin: 10.5 g/dL — ABNORMAL LOW (ref 12.0–15.0)
Immature Granulocytes: 1 %
Lymphocytes Relative: 10 %
Lymphs Abs: 0.7 10*3/uL (ref 0.7–4.0)
MCH: 31.8 pg (ref 26.0–34.0)
MCHC: 30.6 g/dL (ref 30.0–36.0)
MCV: 103.9 fL — AB (ref 80.0–100.0)
MONOS PCT: 8 %
Monocytes Absolute: 0.5 10*3/uL (ref 0.1–1.0)
Neutro Abs: 5.7 10*3/uL (ref 1.7–7.7)
Neutrophils Relative %: 80 %
PLATELETS: 141 10*3/uL — AB (ref 150–400)
RBC: 3.3 MIL/uL — ABNORMAL LOW (ref 3.87–5.11)
RDW: 15.9 % — ABNORMAL HIGH (ref 11.5–15.5)
WBC: 7 10*3/uL (ref 4.0–10.5)
nRBC: 0 % (ref 0.0–0.2)

## 2019-01-28 LAB — COMPREHENSIVE METABOLIC PANEL
ALT: 8 U/L (ref 0–44)
AST: 18 U/L (ref 15–41)
Albumin: 3.2 g/dL — ABNORMAL LOW (ref 3.5–5.0)
Alkaline Phosphatase: 59 U/L (ref 38–126)
Anion gap: 6 (ref 5–15)
BUN: 23 mg/dL (ref 8–23)
CALCIUM: 10.9 mg/dL — AB (ref 8.9–10.3)
CO2: 26 mmol/L (ref 22–32)
Chloride: 107 mmol/L (ref 98–111)
Creatinine, Ser: 1.78 mg/dL — ABNORMAL HIGH (ref 0.44–1.00)
GFR calc Af Amer: 31 mL/min — ABNORMAL LOW (ref >60)
GFR calc non Af Amer: 27 mL/min — ABNORMAL LOW (ref >60)
Glucose, Bld: 119 mg/dL — ABNORMAL HIGH (ref 70–99)
Potassium: 3.4 mmol/L — ABNORMAL LOW (ref 3.5–5.1)
Sodium: 139 mmol/L (ref 135–145)
Total Bilirubin: 0.9 mg/dL (ref 0.3–1.2)
Total Protein: 6.3 g/dL — ABNORMAL LOW (ref 6.5–8.1)

## 2019-01-28 LAB — CK: Total CK: 363 U/L — ABNORMAL HIGH (ref 38–234)

## 2019-01-28 LAB — BRAIN NATRIURETIC PEPTIDE: B Natriuretic Peptide: 309.3 pg/mL — ABNORMAL HIGH (ref 0.0–100.0)

## 2019-01-28 MED ORDER — POTASSIUM CHLORIDE CRYS ER 20 MEQ PO TBCR
40.0000 meq | EXTENDED_RELEASE_TABLET | Freq: Once | ORAL | Status: AC
  Administered 2019-01-28: 40 meq via ORAL
  Filled 2019-01-28: qty 2

## 2019-01-28 MED ORDER — SODIUM CHLORIDE 0.9 % IV BOLUS
500.0000 mL | Freq: Once | INTRAVENOUS | Status: AC
  Administered 2019-01-28: 500 mL via INTRAVENOUS

## 2019-01-28 NOTE — Progress Notes (Signed)
CSW spoke with patient via bedside regarding disposition plans. Patient was recently at [facility] and was discharged on 3/1 going back home. CSW reached out to [facility] to determine patients insurance days/ if she had SNF days left. Per facility, patient was declined by insurance for continued SNF days on 2/17 and would be in copay days.   CSW discussed this with patient who states she is unable to pay out of pocket. Patient would prefer to go home with home health- she currently is using [facility] but would prefer to start using [facility]. CSW will notify RN CM and notified EDP.    Kingsley Spittle, LCSW Clinical Social Worker  [facility]  [phone]

## 2019-01-28 NOTE — ED Notes (Signed)
Bed: KU57 Expected date:  Expected time:  Means of arrival:  Comments: EMS-fall

## 2019-01-28 NOTE — ED Notes (Signed)
Pts husband is coming to pick her up.

## 2019-01-28 NOTE — ED Provider Notes (Signed)
[facility] DEPT Provider Note   CSN: [id number] Arrival date & time: 01/28/19  1105    History   Chief Complaint Chief Complaint  Patient presents with  . Fall    HPI Diane [name] is a 78 y.o. female.     HPI  78 year old female presents after slipping out of her lift chair.  She states this occurred around 8 PM last night.  She was calling for her husband but he did not hear her and then she was unable to get up on her own.  2 months ago she broke her left lower extremity and had to have it repaired.  She briefly went to [facility] and then went home.  She is able to ambulate very small distances with a walker.  However last night she slipped off the chair.  She states that her ankle/lower leg is more sore but she does not think she twisted it or directly injured it.  She did not hit her head last night.  She has been having generalized weakness ever since the original fall 2 months ago which is not particularly worse today.  She is asking to be admitted to a rehab facility as her husband states he cannot take care of her at home.  She has acute on chronic lower extremity edema.  She denies any dyspnea.  She was noted to be mildly hypoxic to 88% when she arrived here but states she is supposed to be on oxygen and when placed on oxygen her O2 sats are now normal.  Past Medical History:  Diagnosis Date  . Anemia   . Aortic atherosclerosis ([place]) 12/03/2017   Noted on CT scan  . CAD (coronary artery disease), native coronary artery 12/03/2017   Noted on CT scan by calcification   . CHF (congestive heart failure) ([place])   . Chronic diastolic heart failure ([place]) 12/03/2017  . Dyspnea   . GERD (gastroesophageal reflux disease)   . Heart murmur   . History of blood transfusion    "related to chemo" (12/05/2017)  . Hypertension   . Hypertensive heart disease 12/03/2017  . Kidney disease, chronic, stage III (GFR 30-59 ml/min) ([place]) 12/03/2017  . Lung cancer  ([place]) dx'd 02/2010  . Mitral valve prolapse   . Morbid obesity ([place]) 12/03/2017  . On home oxygen therapy   . 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 Paroxysmal atrial fibrillation (H[facility])   . Persistent atrial fibrillation 12/03/2017  . Restrictive lung disease 05/23/2017    Patient Active Problem List   Diagnosis Date Noted  . Fall at home, initial encounter 11/30/2018  . Trimalleolar fracture of ankle, closed, left, initial encounter 11/29/2018  . Polyp of cecum   . Polyp of ascending colon   . Polyp of transverse colon   . Polyp of descending colon   . Polyp of rectum   . Heme positive stool 06/11/2018  . Oxygen dependent 06/11/2018  . Status post bronchoscopy with biopsy   . Anemia 04/25/2018  . Chronic respiratory failure with hypoxia ([place]) 01/17/2018  . Hypotension 12/13/2017  . COPD (chronic obstructive pulmonary disease) ([place]) 12/11/2017  . Chronic diastolic heart failure ([place]) 12/03/2017  . Persistent atrial fibrillation; CHA2DS2Vacs = 6; Eliquis; Amiodarone for rhythm control 12/03/2017  . Chronic anticoagulation 12/03/2017  . Morbid obesity ([place]) 12/03/2017  . Kidney disease, chronic, stage III (GFR 30-59 ml/min) (HCC) 12/03/2017  . Hypertensive heart and chronic kidney disease with heart failure and stage 1 through  stage 4 chronic kidney disease, or chronic kidney disease ([place]) 12/03/2017  . Coronary artery disease involving native coronary artery of native heart without angina pectoris 12/03/2017  . Aortic atherosclerosis ([place]) 12/03/2017  . Restrictive lung disease 05/23/2017  . Port catheter in place Aug 05, 202017  . Cancer of right lung parenchyma ([place]) 10/17/2011    Past Surgical History:  Procedure Laterality Date  . BIOPSY  07/16/2018   Procedure: BIOPSY;  Surgeon: Mauri Pole, MD;  Location: [facility];  Service: Endoscopy;;  . CARDIOVERSION N/A 12/06/2017   Procedure: CARDIOVERSION;  Surgeon: Jacolyn Reedy, MD;  Location[facility]COPY;   Service: Cardiovascular;  Laterality: N/A;  . CATARACT EXTRACTION W/ INTRAOCULAR LENS  IMPLANT, BILATERAL Bilateral   . COLONOSCOPY WITH PROPOFOL N/A 07/16/2018   Procedure: COLONOSCOPY WITH PROPOFOL;  Surgeon: Mauri Pole, MD;  Location: [facility] ENDOSCOPY;  Service: Endoscopy;  Laterality: N/A;  . DERMABRASION  ~ 1966   "face; for acne"  . ESOPHAGOGASTRODUODENOSCOPY (EGD) WITH PROPOFOL N/A 07/16/2018   Procedure: ESOPHAGOGASTRODUODENOSCOPY (EGD) WITH PROPOFOL;  Surgeon: Mauri Pole, MD;  Location: [facility] ENDOSCOPY;  Service: Endoscopy;  Laterality: N/A;  . ORIF ANKLE FRACTURE Left 12/06/2018   Procedure: OPEN REDUCTION INTERNAL FIXATION (ORIF) ANKLE FRACTURE;  Surgeon: Nicholes Stairs, MD;  Location: [facility];  Service: Orthopedics;  Laterality: Left;  . PAROTID GLAND TUMOR EXCISION Left   . POLYPECTOMY  07/16/2018   Procedure: POLYPECTOMY;  Surgeon: Mauri Pole, MD;  Location: [facility] ENDOSCOPY;  Service: Endoscopy;;  . PORTACATH PLACEMENT  11/03/08-[place]   tip in mid SVC-E. [place]  . THORACENTESIS Right   . TONSILLECTOMY    . VIDEO BRONCHOSCOPY WITH ENDOBRONCHIAL ULTRASOUND Right 05/08/2018   Procedure: VIDEO BRONCHOSCOPY WITH ENDOBRONCHIAL ULTRASOUND;  Surgeon: Collene Gobble, MD;  Location: [facility] OR;  Service: Thoracic;  Laterality: Right;     OB History   No obstetric history on file.      Home Medications    Prior to Admission medications   Medication Sig Start Date End Date Taking? Authorizing Provider  acetaminophen (TYLENOL) 500 MG tablet Take 1,000 mg by mouth at bedtime.   Yes [provider]  albuterol (PROAIR HFA) 108 (90 Base) MCG/ACT inhaler Inhale 2 puffs into the lungs every 4 (four) hours as needed for wheezing or shortness of breath. 07/08/18  Yes Collene Gobble, MD  amiodarone (PACERONE) 200 MG tablet Take 1 tablet (200 mg total) by mouth daily. 10/02/18  Yes Leonie Man, MD  ANORO ELLIPTA 62.5-25 MCG/INH AEPB INHALE 1 PUFF BY MOUTH INTO  LUNGS ONCE DAILY Patient taking differently: Inhale 1 puff into the lungs daily.  09/19/18  Yes Collene Gobble, MD  apixaban (ELIQUIS) 5 MG TABS tablet Take 1 tablet (5 mg total) by mouth 2 (two) times daily. OK to restart on Friday 05/10/18. 10/02/18  Yes Leonie Man, MD  calcitRIOL (ROCALTROL) 0.5 MCG capsule Take 0.5 mcg by mouth daily.  09/27/18  Yes [provider]  carvedilol (COREG) 25 MG tablet Take 1 tablet (25 mg total) by mouth 2 (two) times daily with a meal. 10/02/18  Yes Leonie Man, MD  cyanocobalamin (,VITAMIN B-12,) 1000 MCG/ML injection Inject 1,000 mcg into the muscle every 30 (thirty) days.  09/18/18  Yes [provider]  ferrous sulfate 325 (65 FE) MG tablet Take 325 mg by mouth 2 (two) times daily.   Yes [provider]  hydrALAZINE (APRESOLINE) 25 MG tablet Take 1 tablet (25 mg total)  by mouth 2 (two) times daily. 10/02/18  Yes Leonie Man, MD  hydroxypropyl methylcellulose / hypromellose (ISOPTO TEARS / GONIOVISC) 2.5 % ophthalmic solution Place 1 drop into both eyes 2 (two) times daily as needed for dry eyes.    Yes [provider]  Melatonin 10 MG TABS Take 10 mg by mouth daily.   Yes [provider]  oseltamivir (TAMIFLU) 30 MG capsule Take 30 mg by mouth daily. 9 day course 01/22/19  Yes [provider]  oxyCODONE (OXY IR/ROXICODONE) 5 MG immediate release tablet Take 1 tablet (5 mg total) by mouth every 4 (four) hours as needed for moderate pain. 12/11/18  Yes Mikhail, [place], DO  OXYGEN Inhale 2 L into the lungs continuous.   Yes [provider]  polyethylene glycol (MIRALAX / GLYCOLAX) packet Take 17 g by mouth at bedtime.    Yes [provider]  Potassium Chloride ER 20 MEQ TBCR Take 40 mEq by mouth 2 (two) times daily.  01/27/19  Yes [provider]  ranitidine (ZANTAC) 150 MG tablet Take 150 mg by mouth daily. 01/23/19  Yes [provider]  sennosides-docusate sodium  (SENOKOT-S) 8.6-50 MG tablet Take 2 tablets by mouth 2 (two) times daily.   Yes [provider]  torsemide (DEMADEX) 20 MG tablet Take 2 tablets (40 mg total) by mouth 2 (two) times daily with a meal. Patient taking differently: Take 20 mg by mouth daily.  07/31/18  Yes Leonie Man, MD  docusate sodium (COLACE) 100 MG capsule Take 1 capsule (100 mg total) by mouth 2 (two) times daily. Patient not taking: Reported on 01/28/2019 12/11/18   Cristal Ford, DO    Family History Family History  Problem Relation Age of Onset  . Breast cancer Mother   . Heart attack Mother   . Diabetes Father   . Heart attack Father   . Diabetes Brother   . COPD Cousin   . Colon cancer Neg Hx     Social History Social History   Tobacco Use  . Smoking status: Former Smoker    Packs/day: 2.00    Years: 40.00    Pack years: 80.00    Types: Cigarettes    Last attempt to quit: 11/27/2001    Years since quitting: 17.1  . Smokeless tobacco: Never Used  Substance Use Topics  . Alcohol use: Yes    Comment: 12/05/2017 "once in a great while I might have a drink"  . Drug use: No     Allergies   Septra ds [sulfamethoxazole-trimethoprim]   Review of Systems Review of Systems  Constitutional: Negative for fever.  Respiratory: Negative for shortness of breath.   Cardiovascular: Positive for leg swelling. Negative for chest pain.  Gastrointestinal: Negative for abdominal pain and vomiting.  Musculoskeletal: Positive for arthralgias and joint swelling.  Neurological: Positive for weakness. Negative for headaches.  All other systems reviewed and are negative.    Physical Exam Updated Vital Signs BP (!) 145/60   Pulse 88   Temp 98.1 F (36.7 C) (Oral)   Resp 16   Ht 5\' 5"  (1.651 m)   SpO2 100%   BMI 48.94 kg/m   Physical Exam Vitals signs and nursing note reviewed.  Constitutional:      Appearance: She is well-developed. She is obese.  HENT:     Head: Normocephalic and atraumatic.       Right Ear: External ear normal.     Left Ear: External ear normal.  Nose: Nose normal.  Eyes:     General:        Right eye: No discharge.        Left eye: No discharge.  Cardiovascular:     Rate and Rhythm: Normal rate and regular rhythm.     Pulses:          Dorsalis pedis pulses are 2+ on the right side and 2+ on the left side.     Heart sounds: Normal heart sounds.  Pulmonary:     Effort: Pulmonary effort is normal.     Breath sounds: Normal breath sounds.  Abdominal:     Palpations: Abdomen is soft.     Tenderness: There is no abdominal tenderness.  Musculoskeletal:     Left ankle: She exhibits swelling. She exhibits normal range of motion. No tenderness.     Right lower leg: Edema present.     Left lower leg: She exhibits tenderness. Edema present.     Left foot: Tenderness and swelling present.  Skin:    General: Skin is warm and dry.  Neurological:     Mental Status: She is alert.     Comments: Equal strength in all 4 extremities but diffusely weak.  Psychiatric:        Mood and Affect: Mood is not anxious.      ED Treatments / Results  Labs (all labs ordered are listed, but only abnormal results are displayed) Labs Reviewed  URINALYSIS, ROUTINE W REFLEX MICROSCOPIC - Abnormal; Notable for the following components:      Result Value   Leukocytes,Ua SMALL (*)    Bacteria, UA FEW (*)    All other components within normal limits  COMPREHENSIVE METABOLIC PANEL - Abnormal; Notable for the following components:   Potassium 3.4 (*)    Glucose, Bld 119 (*)    Creatinine, Ser 1.78 (*)    Calcium 10.9 (*)    Total Protein 6.3 (*)    Albumin 3.2 (*)    GFR calc non Af Amer 27 (*)    GFR calc Af Amer 31 (*)    All other components within normal limits  CBC WITH DIFFERENTIAL/PLATELET - Abnormal; Notable for the following components:   RBC 3.30 (*)    Hemoglobin 10.5 (*)    HCT 34.3 (*)    MCV 103.9 (*)    RDW 15.9 (*)    Platelets 141 (*)    All other  components within normal limits  BRAIN NATRIURETIC PEPTIDE - Abnormal; Notable for the following components:   B Natriuretic Peptide 309.3 (*)    All other components within normal limits  CK - Abnormal; Notable for the following components:   Total CK 363 (*)    All other components within normal limits    EKG EKG Interpretation  Date/Time:  Tuesday January 28 2019 11:51:26 EST Ventricular Rate:  88 PR Interval:    QRS Duration: 171 QT Interval:  430 QTC Calculation: 521 R Axis:   123 Text Interpretation:  Sinus rhythm RBBB and LPFB Baseline wander in lead(s) I Interpretation limited secondary to artifact otherwise appears similar to Jan 2020 Confirmed by Sherwood Gambler [phone]) on 01/28/2019 12:38:47 PM   Radiology Dg Chest 2 View  Result Date: 01/28/2019 CLINICAL DATA:  Patient fell last night. Low back pain. History of congestive heart failure with soft tissue edema. EXAM: CHEST - 2 VIEW COMPARISON:  Radiographs 12/01/2018.  CT 08/19/2018. FINDINGS: Left IJ Port-A-Cath extends to the mid SVC level in  appears stable. The heart size and mediastinal contours are stable with aortic atherosclerosis. 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 The left lung is clear. There is no new airspace disease or pneumothorax. No acute osseous findings are seen. IMPRESSION: Stable chest with post treatment changes in the right hemithorax attributed to history of lung cancer. No acute findings. Electronically Signed   By: Richardean Sale M.D.   On: 01/28/2019 13:46   Dg Tibia/fibula Left  Result Date: 01/28/2019 CLINICAL DATA:  Patient fell last night with ankle pain and lower extremity edema. Ankle fracture with ORIF 12/06/2018. EXAM: LEFT TIBIA AND FIBULA - 2 VIEW COMPARISON:  Intraoperative radiographs 12/06/2018. FINDINGS: The hardware is intact status post distal fibular plate and screw and medial  malleolar screw fixation. No definite healing of the medial malleolar fracture is demonstrated. This fracture remains mildly displaced, but unchanged from the intraoperative views. There is no displacement of the distal fibular fracture. There is no new fracture or dislocation. There is diffuse lower leg soft tissue edema. IMPRESSION: Intact hardware and stable alignment of mildly displaced fracture of the medial malleolus. No interval healing identified. Nonspecific lower leg soft tissue edema. Electronically Signed   By: Richardean Sale M.D.   On: 01/28/2019 13:39   Dg Foot Complete Left  Result Date: 01/28/2019 CLINICAL DATA:  Patient fell last night with ankle pain and inability to get up. Lower leg edema. Ankle fracture with ORIF 12/06/2018. EXAM: LEFT FOOT - COMPLETE 3+ VIEW COMPARISON:  Intraoperative radiographs 12/06/2018. Additional radiographs 11/29/2018. FINDINGS: The bones are demineralized. No evidence of acute fracture or dislocation. There are stable postsurgical changes status post bimalleolar ORIF. The hardware is intact. There is generalized soft tissue swelling, especially in the dorsum of the midfoot and forefoot. No evidence of soft tissue emphysema. IMPRESSION: No acute osseous findings post ankle ORIF. Generalized soft tissue swelling. Electronically Signed   By: Richardean Sale M.D.   On: 01/28/2019 13:43    Procedures Procedures (including critical care time)  Medications Ordered in ED Medications  sodium chloride 0.9 % bolus 500 mL (0 mLs Intravenous Stopped 01/28/19 1408)  potassium chloride SA (K-DUR,KLOR-CON) CR tablet 40 mEq (40 mEq Oral Given 01/28/19 1336)     Initial Impression / Assessment and Plan / ED Course  I have reviewed the triage vital signs and the nursing notes.  Pertinent labs & imaging results that were available during my care of the patient were reviewed by me and considered in my medical decision making (see chart for details).        The patient  does not have any acute emergent medical condition at this time.  Minimal hypokalemia.  Given small fluid bolus given the mild bump in her CK but this is not really consistent with significant rhabdomyolysis and there is no myoglobinuria in her urine.  As for her left leg, tib-fib/foot x-rays are benign.  Social work was consulted and the patient would have to pay out-of-pocket to go to a rehab facility.  She does not want to do this but rather wants to go home and have the home health agency changed.  Thus, given no obvious finding that would warrant admission, she will be discharged home.  Final Clinical Impressions(s) / ED Diagnoses   Final diagnoses:  Generalized weakness    ED Discharge Orders         [place]  01/28/19 1229    Face-to-face encounter (required for Medicare/Medicaid patients)    Comments:  I Ephraim Hamburger certify that this patient is under my care and that I, or a nurse practitioner or physician's assistant working with me, had a face-to-face encounter that meets the physician face-to-face encounter requirements with this patient on 01/28/2019. The encounter with the patient was in whole, or in part for the following medical condition(s) which is the primary reason for home health care (List medical condition): generalized weakness   01/28/19 1229           Sherwood Gambler, MD 01/28/19 1438

## 2019-01-28 NOTE — ED Notes (Signed)
Patient transported to X-ray 

## 2019-01-28 NOTE — ED Notes (Signed)
Bed: [facility] Expected date:  Expected time:  Means of arrival:  Comments: No bed.

## 2019-01-28 NOTE — ED Triage Notes (Signed)
Pt BIB EMS from home. Pt was recently at [facility] for rehab from fall with tib/fib fracture. Pt is still non-ambulatory. Pt tried to stand up and slid out of chair. Pt has been laying on ground since last night because husband unable to get her up. Pt reports lower back pain and is concerned about left ankle. Pt bilateral edema due to hx of CHF. Pt denies hitting head or LOC. Husband wants pt to be replaced into a facility.   140/77 HR 83 SpO2 95% RA

## 2019-02-13 ENCOUNTER — Other Ambulatory Visit: Payer: Self-pay

## 2019-02-13 ENCOUNTER — Observation Stay (HOSPITAL_COMMUNITY)
Admission: EM | Admit: 2019-02-13 | Discharge: 2019-02-18 | Disposition: A | Discharge status: Home / Self Care | Payer: Medicare Other | Attending: Internal Medicine | Admitting: Internal Medicine

## 2019-02-13 ENCOUNTER — Encounter (HOSPITAL_COMMUNITY): Payer: Self-pay

## 2019-02-13 ENCOUNTER — Emergency Department (HOSPITAL_COMMUNITY): Payer: Medicare Other

## 2019-02-13 DIAGNOSIS — R197 Diarrhea, unspecified: Secondary | ICD-10-CM

## 2019-02-13 DIAGNOSIS — I4819 Other persistent atrial fibrillation: Secondary | ICD-10-CM | POA: Insufficient documentation

## 2019-02-13 DIAGNOSIS — G8929 Other chronic pain: Secondary | ICD-10-CM | POA: Diagnosis not present

## 2019-02-13 DIAGNOSIS — Z79899 Other long term (current) drug therapy: Secondary | ICD-10-CM | POA: Insufficient documentation

## 2019-02-13 DIAGNOSIS — I130 Hypertensive heart and chronic kidney disease with heart failure and stage 1 through stage 4 chronic kidney disease, or unspecified chronic kidney disease: Secondary | ICD-10-CM | POA: Insufficient documentation

## 2019-02-13 DIAGNOSIS — N179 Acute kidney failure, unspecified: Secondary | ICD-10-CM | POA: Diagnosis not present

## 2019-02-13 DIAGNOSIS — Z7901 Long term (current) use of anticoagulants: Secondary | ICD-10-CM | POA: Diagnosis present

## 2019-02-13 DIAGNOSIS — R531 Weakness: Secondary | ICD-10-CM | POA: Diagnosis not present

## 2019-02-13 DIAGNOSIS — J449 Chronic obstructive pulmonary disease, unspecified: Secondary | ICD-10-CM | POA: Diagnosis not present

## 2019-02-13 DIAGNOSIS — N184 Chronic kidney disease, stage 4 (severe): Secondary | ICD-10-CM | POA: Insufficient documentation

## 2019-02-13 DIAGNOSIS — Z87891 Personal history of nicotine dependence: Secondary | ICD-10-CM | POA: Diagnosis not present

## 2019-02-13 DIAGNOSIS — I5032 Chronic diastolic (congestive) heart failure: Secondary | ICD-10-CM | POA: Diagnosis not present

## 2019-02-13 LAB — COMPREHENSIVE METABOLIC PANEL
ALT: 13 U/L (ref 0–44)
AST: 17 U/L (ref 15–41)
Albumin: 3.3 g/dL — ABNORMAL LOW (ref 3.5–5.0)
Alkaline Phosphatase: 51 U/L (ref 38–126)
Anion gap: 9 (ref 5–15)
BUN: 33 mg/dL — ABNORMAL HIGH (ref 8–23)
CO2: 30 mmol/L (ref 22–32)
CREATININE: 2.86 mg/dL — AB (ref 0.44–1.00)
Calcium: 10.2 mg/dL (ref 8.9–10.3)
Chloride: 96 mmol/L — ABNORMAL LOW (ref 98–111)
GFR calc Af Amer: 18 mL/min — ABNORMAL LOW (ref >60)
GFR calc non Af Amer: 15 mL/min — ABNORMAL LOW (ref >60)
Glucose, Bld: 122 mg/dL — ABNORMAL HIGH (ref 70–99)
Potassium: 4.5 mmol/L (ref 3.5–5.1)
Sodium: 135 mmol/L (ref 135–145)
Total Bilirubin: 0.9 mg/dL (ref 0.3–1.2)
Total Protein: 6.5 g/dL (ref 6.5–8.1)

## 2019-02-13 LAB — CBC WITH DIFFERENTIAL/PLATELET
Abs Immature Granulocytes: 0.05 10*3/uL (ref 0.00–0.07)
Basophils Absolute: 0 10*3/uL (ref 0.0–0.1)
Basophils Relative: 1 %
EOS PCT: 3 %
Eosinophils Absolute: 0.2 10*3/uL (ref 0.0–0.5)
HCT: 33.4 % — ABNORMAL LOW (ref 36.0–46.0)
Hemoglobin: 10.2 g/dL — ABNORMAL LOW (ref 12.0–15.0)
Immature Granulocytes: 1 %
LYMPHS ABS: 0.9 10*3/uL (ref 0.7–4.0)
LYMPHS PCT: 18 %
MCH: 32.1 pg (ref 26.0–34.0)
MCHC: 30.5 g/dL (ref 30.0–36.0)
MCV: 105 fL — AB (ref 80.0–100.0)
Monocytes Absolute: 0.9 10*3/uL (ref 0.1–1.0)
Monocytes Relative: 17 %
Neutro Abs: 3.2 10*3/uL (ref 1.7–7.7)
Neutrophils Relative %: 60 %
Platelets: 201 10*3/uL (ref 150–400)
RBC: 3.18 MIL/uL — ABNORMAL LOW (ref 3.87–5.11)
RDW: 15.7 % — ABNORMAL HIGH (ref 11.5–15.5)
WBC: 5.2 10*3/uL (ref 4.0–10.5)
nRBC: 0 % (ref 0.0–0.2)

## 2019-02-13 LAB — PHOSPHORUS: Phosphorus: 3.6 mg/dL (ref 2.5–4.6)

## 2019-02-13 LAB — PROTIME-INR
INR: 1.8 — ABNORMAL HIGH (ref 0.8–1.2)
Prothrombin Time: 20.9 seconds — ABNORMAL HIGH (ref 11.4–15.2)

## 2019-02-13 LAB — MAGNESIUM: Magnesium: 2.5 mg/dL — ABNORMAL HIGH (ref 1.7–2.4)

## 2019-02-13 MED ORDER — ACETAMINOPHEN 650 MG RE SUPP: 650.0000 mg | Freq: Four times a day (QID) | RECTAL | Status: DC | PRN

## 2019-02-13 MED ORDER — VITAMIN B-12 1000 MCG PO TABS
1000.0000 ug | ORAL_TABLET | Freq: Every day | ORAL | Status: DC
  Administered 2019-02-14 – 2019-02-18 (×5): 1000 ug via ORAL
  Filled 2019-02-13 (×5): qty 1

## 2019-02-13 MED ORDER — AMIODARONE HCL 200 MG PO TABS
200.0000 mg | ORAL_TABLET | Freq: Every day | ORAL | Status: DC
  Administered 2019-02-14 – 2019-02-18 (×5): 200 mg via ORAL
  Filled 2019-02-13 (×5): qty 1

## 2019-02-13 MED ORDER — APIXABAN 5 MG PO TABS: 5.0000 mg | ORAL_TABLET | Freq: Two times a day (BID) | ORAL | Status: DC

## 2019-02-13 MED ORDER — OXYCODONE HCL 5 MG PO TABS
5.0000 mg | ORAL_TABLET | ORAL | Status: DC | PRN
  Administered 2019-02-14 – 2019-02-18 (×6): 5 mg via ORAL
  Filled 2019-02-13 (×6): qty 1

## 2019-02-13 MED ORDER — ACETAMINOPHEN 325 MG PO TABS
650.0000 mg | ORAL_TABLET | Freq: Four times a day (QID) | ORAL | Status: DC | PRN
  Administered 2019-02-17: 650 mg via ORAL
  Filled 2019-02-13: qty 2

## 2019-02-13 MED ORDER — MELATONIN 5 MG PO TABS
10.0000 mg | ORAL_TABLET | Freq: Every day | ORAL | Status: DC
  Administered 2019-02-14 – 2019-02-17 (×5): 10 mg via ORAL
  Filled 2019-02-13 (×7): qty 2

## 2019-02-13 MED ORDER — FERROUS SULFATE 325 (65 FE) MG PO TABS
325.0000 mg | ORAL_TABLET | Freq: Three times a day (TID) | ORAL | Status: DC
  Administered 2019-02-14 – 2019-02-18 (×14): 325 mg via ORAL
  Filled 2019-02-13 (×14): qty 1

## 2019-02-13 MED ORDER — SODIUM CHLORIDE 0.9 % IV SOLN
INTRAVENOUS | Status: AC
  Administered 2019-02-13: via INTRAVENOUS

## 2019-02-13 MED ORDER — ALBUTEROL SULFATE (2.5 MG/3ML) 0.083% IN NEBU: 2.5000 mg | INHALATION_SOLUTION | Freq: Four times a day (QID) | RESPIRATORY_TRACT | Status: DC | PRN

## 2019-02-13 MED ORDER — UMECLIDINIUM-VILANTEROL 62.5-25 MCG/INH IN AEPB
1.0000 | INHALATION_SPRAY | Freq: Every day | RESPIRATORY_TRACT | Status: DC
  Administered 2019-02-14 – 2019-02-18 (×5): 1 via RESPIRATORY_TRACT
  Filled 2019-02-13: qty 14

## 2019-02-13 MED ORDER — CARVEDILOL 12.5 MG PO TABS
12.5000 mg | ORAL_TABLET | Freq: Two times a day (BID) | ORAL | Status: DC
  Administered 2019-02-14 – 2019-02-18 (×10): 12.5 mg via ORAL
  Filled 2019-02-13 (×10): qty 1

## 2019-02-13 MED ORDER — CALCITRIOL 0.5 MCG PO CAPS
0.5000 ug | ORAL_CAPSULE | Freq: Three times a day (TID) | ORAL | Status: DC
  Administered 2019-02-14 – 2019-02-18 (×14): 0.5 ug via ORAL
  Filled 2019-02-13 (×17): qty 1

## 2019-02-13 MED ORDER — SODIUM CHLORIDE 0.9 % IV SOLN
Freq: Once | INTRAVENOUS | Status: AC
  Administered 2019-02-13: 23:00:00 via INTRAVENOUS

## 2019-02-13 NOTE — ED Notes (Signed)
Patient transported to x-ray. ?

## 2019-02-13 NOTE — ED Provider Notes (Signed)
[facility] DEPT Provider Note   CSN: [id number] Arrival date & time: 02/13/19  1858    History   Chief Complaint Chief Complaint  Patient presents with  . Abnormal Labs    HPI Diane [name] is a 78 y.o. female.     The history is provided by the patient and medical records. No language interpreter was used.   Diane [name] is a 78 y.o. female who presents to the Emergency Department complaining of abnormal labs.  She was referred to the ED due to worsening kidney function on outpatient labs that was drawn yesterday.  She complains of progressive generalized weakness for 2-3 months.  She denies fevers, chest pain, sob, nausea, vomiting, diarrhea, dysuria, change in urine.  She broke her ankle in January and had surgery on Jan 10.  She is on 2L O2 at baseline.  She currently lives with her husband.  She does have recent medication changes but is unsure what the changes were.   Past Medical History:  Diagnosis Date  . Anemia   . Aortic atherosclerosis ([place]) 12/03/2017   Noted on CT scan  . CAD (coronary artery disease), native coronary artery 12/03/2017   Noted on CT scan by calcification   . CHF (congestive heart failure) ([place])   . Chronic diastolic heart failure ([place]) 12/03/2017  . Dyspnea   . GERD (gastroesophageal reflux disease)   . Heart murmur   . History of blood transfusion    "related to chemo" (12/05/2017)  . Hypertension   . Hypertensive heart disease 12/03/2017  . Kidney disease, chronic, stage III (GFR 30-59 ml/min) ([place]) 12/03/2017  . Lung cancer ([place]) dx'd 02/2010  . Mitral valve prolapse   . Morbid obesity ([place]) 12/03/2017  . On home oxygen therapy   . 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 Paroxysmal atrial fibrillation ([facility])   . Persistent atrial fibrillation 12/03/2017  . Restrictive lung disease 05/23/2017    Patient Active Problem List   Diagnosis Date Noted  . AKI (acute kidney injury) ([place]) 02/13/2019  . Fall at  home, initial encounter 11/30/2018  . Trimalleolar fracture of ankle, closed, left, initial encounter 11/29/2018  . Polyp of cecum   . Polyp of ascending colon   . Polyp of transverse colon   . Polyp of descending colon   . Polyp of rectum   . Heme positive stool 06/11/2018  . Oxygen dependent 06/11/2018  . Status post bronchoscopy with biopsy   . Anemia 04/25/2018  . Chronic respiratory failure with hypoxia ([place]) 01/17/2018  . Hypotension 12/13/2017  . COPD (chronic obstructive pulmonary disease) ([place]) 12/11/2017  . Chronic diastolic heart failure ([place]) 12/03/2017  . Persistent atrial fibrillation; CHA2DS2Vacs = 6; Eliquis; Amiodarone for rhythm control 12/03/2017  . Chronic anticoagulation 12/03/2017  . Morbid obesity ([place]) 12/03/2017  . Kidney disease, chronic, stage III (GFR 30-59 ml/min) (H[facility]) 12/03/2017  . Hypertensive heart and chronic kidney disease with heart failure and stage 1 through stage 4 chronic kidney disease, or chronic kidney disease ([place]) 12/03/2017  . Coronary artery disease involving native coronary artery of native heart without angina pectoris 12/03/2017  . Aortic atherosclerosis ([place]) 12/03/2017  . Restrictive lung disease 05/23/2017  . Port catheter in place 10/20/2016  . Cancer of right lung parenchyma ([place]) 10/17/2011    Past Surgical History:  Procedure Laterality Date  . BIOPSY  07/16/2018   Procedure: BIOPSY;  Surgeon: Mauri Pole, MD;  Location: [facility] ENDOSCOPY;  Service: Endoscopy;;  .  CARDIOVERSION N/A 12/06/2017   Procedure: CARDIOVERSION;  Surgeon: Jacolyn Reedy, MD;  Location: [facility] ENDOSCOPY;  Service: Cardiovascular;  Laterality: N/A;  . CATARACT EXTRACTION W/ INTRAOCULAR LENS  IMPLANT, BILATERAL Bilateral   . COLONOSCOPY WITH PROPOFOL N/A 07/16/2018   Procedure: COLONOSCOPY WITH PROPOFOL;  Surgeon: Mauri Pole, MD;  Location: [facility] ENDOSCOPY;  Service: Endoscopy;  Laterality: N/A;  . DERMABRASION  ~ 1966   "face; for acne"  .  ESOPHAGOGASTRODUODENOSCOPY (EGD) WITH PROPOFOL N/A 07/16/2018   Procedure: ESOPHAGOGASTRODUODENOSCOPY (EGD) WITH PROPOFOL;  Surgeon: Mauri Pole, MD;  Location: [facility] ENDOSCOPY;  Service: Endoscopy;  Laterality: N/A;  . ORIF ANKLE FRACTURE Left 12/06/2018   Procedure: OPEN REDUCTION INTERNAL FIXATION (ORIF) ANKLE FRACTURE;  Surgeon: Nicholes Stairs, MD;  Location: [facility];  Service: Orthopedics;  Laterality: Left;  . PAROTID GLAND TUMOR EXCISION Left   . POLYPECTOMY  07/16/2018   Procedure: POLYPECTOMY;  Surgeon: Mauri Pole, MD;  Location: [facility] ENDOSCOPY;  Service: Endoscopy;;  . PORTACATH PLACEMENT  11/03/08-[place]   tip in mid SVC-[place]  . THORACENTESIS Right   . TONSILLECTOMY    . VIDEO BRONCHOSCOPY WITH ENDOBRONCHIAL ULTRASOUND Right 05/08/2018   Procedure: VIDEO BRONCHOSCOPY WITH ENDOBRONCHIAL ULTRASOUND;  Surgeon: Collene Gobble, MD;  Location: [facility] OR;  Service: Thoracic;  Laterality: Right;     OB History   No obstetric history on file.      Home Medications    Prior to Admission medications   Medication Sig Start Date End Date Taking? Authorizing Provider  acetaminophen (TYLENOL) 500 MG tablet Take 1,000 mg by mouth at bedtime.   Yes [provider]  amiodarone (PACERONE) 200 MG tablet Take 1 tablet (200 mg total) by mouth daily. 10/02/18  Yes Leonie Man, MD  ANORO ELLIPTA 62.5-25 MCG/INH AEPB INHALE 1 PUFF BY MOUTH INTO LUNGS ONCE DAILY Patient taking differently: Inhale 1 puff into the lungs daily.  09/19/18  Yes Collene Gobble, MD  apixaban (ELIQUIS) 5 MG TABS tablet Take 1 tablet (5 mg total) by mouth 2 (two) times daily. OK to restart on Friday 05/10/18. Patient taking differently: Take 5 mg by mouth 2 (two) times daily.  10/02/18  Yes Leonie Man, MD  calcitRIOL (ROCALTROL) 0.5 MCG capsule Take 0.5 mcg by mouth 3 (three) times daily.  09/27/18  Yes [provider]  carvedilol (COREG) 12.5 MG tablet Take 12.5 mg by mouth 2 (two)  times daily with a meal.   Yes [provider]  ferrous sulfate 325 (65 FE) MG tablet Take 325 mg by mouth 3 (three) times daily with meals.    Yes [provider]  Melatonin 10 MG TABS Take 10 mg by mouth daily.   Yes [provider]  oxyCODONE (OXY IR/ROXICODONE) 5 MG immediate release tablet Take 1 tablet (5 mg total) by mouth every 4 (four) hours as needed for moderate pain. 12/11/18  Yes Mikhail, Velta Addison, DO  Potassium Chloride ER 20 MEQ TBCR Take 40 mEq by mouth 2 (two) times daily.  01/27/19  Yes [provider]  torsemide (DEMADEX) 20 MG tablet Take 2 tablets (40 mg total) by mouth 2 (two) times daily with a meal. Patient taking differently: Take 20 mg by mouth daily.  07/31/18  Yes Leonie Man, MD  vitamin B-12 (CYANOCOBALAMIN) 1000 MCG tablet Take 1,000 mcg by mouth daily.   Yes [provider]  albuterol (PROAIR HFA) 108 (90 Base) MCG/ACT inhaler Inhale 2 puffs into the lungs  every 4 (four) hours as needed for wheezing or shortness of breath. 07/08/18   Collene Gobble, MD  carvedilol (COREG) 25 MG tablet Take 1 tablet (25 mg total) by mouth 2 (two) times daily with a meal. Patient not taking: Reported on 02/13/2019 10/02/18   Leonie Man, MD  docusate sodium (COLACE) 100 MG capsule Take 1 capsule (100 mg total) by mouth 2 (two) times daily. Patient not taking: Reported on 01/28/2019 12/11/18   Cristal Ford, DO  hydrALAZINE (APRESOLINE) 25 MG tablet Take 1 tablet (25 mg total) by mouth 2 (two) times daily. Patient not taking: Reported on 02/13/2019 10/02/18   Leonie Man, MD  OXYGEN Inhale 2 L into the lungs continuous.    [provider]  ranitidine (ZANTAC) 150 MG tablet Take 150 mg by mouth.  01/23/19   [provider]    Family History Family History  Problem Relation Age of Onset  . Breast cancer Mother   . Heart attack Mother   . Diabetes Father   . Heart attack Father   . Diabetes Brother   . COPD Cousin    . Colon cancer Neg Hx     Social History Social History   Tobacco Use  . Smoking status: Former Smoker    Packs/day: 2.00    Years: 40.00    Pack years: 80.00    Types: Cigarettes    Last attempt to quit: 11/27/2001    Years since quitting: 17.2  . Smokeless tobacco: Never Used  Substance Use Topics  . Alcohol use: Yes    Comment: 12/05/2017 "once in a great while I might have a drink"  . Drug use: No     Allergies   Septra ds [sulfamethoxazole-trimethoprim]   Review of Systems Review of Systems  All other systems reviewed and are negative.    Physical Exam Updated Vital Signs BP 139/67 (BP Location: Right Arm)   Pulse 82   Temp 98.3 F (36.8 C) (Oral)   Resp (!) 22   Ht 5\' 6"  (1.676 m)   Wt 117.9 kg   SpO2 100%   BMI 41.97 kg/m   Physical Exam Vitals signs and nursing note reviewed.  Constitutional:      Appearance: She is well-developed.  HENT:     Head: Normocephalic and atraumatic.  Cardiovascular:     Rate and Rhythm: Normal rate and regular rhythm.     Heart sounds: No murmur.  Pulmonary:     Effort: Pulmonary effort is normal. No respiratory distress.     Breath sounds: Normal breath sounds.  Abdominal:     Palpations: Abdomen is soft.     Tenderness: There is no abdominal tenderness. There is no guarding or rebound.  Musculoskeletal:        General: No tenderness.     Comments: Trace pitting edema to BLE, left greater than right.    Skin:    General: Skin is warm and dry.  Neurological:     Mental Status: She is alert and oriented to person, place, and time.  Psychiatric:        Behavior: Behavior normal.      ED Treatments / Results  Labs (all labs ordered are listed, but only abnormal results are displayed) Labs Reviewed  COMPREHENSIVE METABOLIC PANEL - Abnormal; Notable for the following components:      Result Value   Chloride 96 (*)    Glucose, Bld 122 (*)    BUN 33 (*)  Creatinine, Ser 2.86 (*)    Albumin 3.3 (*)    GFR  calc non Af Amer 15 (*)    GFR calc Af Amer 18 (*)    All other components within normal limits  CBC WITH DIFFERENTIAL/PLATELET - Abnormal; Notable for the following components:   RBC 3.18 (*)    Hemoglobin 10.2 (*)    HCT 33.4 (*)    MCV 105.0 (*)    RDW 15.7 (*)    All other components within normal limits  PROTIME-INR - Abnormal; Notable for the following components:   Prothrombin Time 20.9 (*)    INR 1.8 (*)    All other components within normal limits  MAGNESIUM - Abnormal; Notable for the following components:   Magnesium 2.5 (*)    All other components within normal limits  C DIFFICILE QUICK SCREEN W PCR REFLEX  GASTROINTESTINAL PANEL BY PCR, STOOL (REPLACES STOOL CULTURE)  PHOSPHORUS  URINALYSIS, ROUTINE W REFLEX MICROSCOPIC  BASIC METABOLIC PANEL    EKG None  Radiology Dg Chest 2 View  Result Date: 02/13/2019 CLINICAL DATA:  Weakness EXAM: CHEST - 2 VIEW COMPARISON:  01/28/2019 FINDINGS: Left chest wall Port-A-Cath tip projects over the lower SVC, unchanged. There is a small right pleural effusion with right basilar atelectasis. Postsurgical findings of the right hemithorax. Left lung is clear. IMPRESSION: Unchanged chest radiograph postsurgical changes of the right lung and small pleural effusion. Electronically Signed   By: Ulyses Jarred M.D.   On: 02/13/2019 20:34    Procedures Procedures (including critical care time)  Medications Ordered in ED Medications  oxyCODONE (Oxy IR/ROXICODONE) immediate release tablet 5 mg (has no administration in time range)  amiodarone (PACERONE) tablet 200 mg (has no administration in time range)  carvedilol (COREG) tablet 12.5 mg (has no administration in time range)  calcitRIOL (ROCALTROL) capsule 0.5 mcg (has no administration in time range)  apixaban (ELIQUIS) tablet 5 mg (has no administration in time range)  ferrous sulfate tablet 325 mg (has no administration in time range)  vitamin B-12 (CYANOCOBALAMIN) tablet 1,000 mcg  (has no administration in time range)  Melatonin TABS 10 mg (has no administration in time range)  umeclidinium-vilanterol (ANORO ELLIPTA) 62.5-25 MCG/INH 1 puff (has no administration in time range)  albuterol (PROVENTIL) (2.5 MG/3ML) 0.083% nebulizer solution 2.5 mg (has no administration in time range)  acetaminophen (TYLENOL) tablet 650 mg (has no administration in time range)    Or  acetaminophen (TYLENOL) suppository 650 mg (has no administration in time range)  0.9 %  sodium chloride infusion (has no administration in time range)  0.9 %  sodium chloride infusion ( Intravenous New Bag/Given 02/13/19 2309)     Initial Impression / Assessment and Plan / ED Course  I have reviewed the triage vital signs and the nursing notes.  Pertinent labs & imaging results that were available during my care of the patient were reviewed by me and considered in my medical decision making (see chart for details).       Patient with history of CKD here for evaluation of lab abnormality in progressive generalized weakness. She has generalized weakness on evaluation without focal neurologic deficits. Labs demonstrate acute on chronic renal insufficiency. She was treated with gentle IV fluid hydration with plan to admit for observation and further management. Hospitalist consulted for admission for further treatment. Final Clinical Impressions(s) / ED Diagnoses   Final diagnoses:  AKI (acute kidney injury) [facility])    ED Discharge Orders    None  Quintella Reichert, MD 02/13/19 [phone]

## 2019-02-13 NOTE — ED Triage Notes (Addendum)
Per EMS, patient coming from home with complaints of abnormal labs; patient had labs drawn yesterday at PCP and they called told her to come to the hospital to be admitted today.   Patient wears 2 L of O2 via [place] at baseline. Patient has a broken left ankle that is in a boot.

## 2019-02-13 NOTE — ED Notes (Signed)
Bed: HS92 Expected date:  Expected time:  Means of arrival:  Comments: EMS 77yo abnormal labs- creat 3

## 2019-02-13 NOTE — H&P (Signed)
History and Physical    Diane [name]:[id number] DOB: 10/23/1941 DOA: 02/13/2019  PCP: [name], MD Patient coming from: Home  Chief Complaint: Worsening kidney function  HPI: Diane [name] is a 78 y.o. female with medical history significant of CKD III-IV, CAD, chronic diastolic congestive heart failure, hypertension, persistent atrial fibrillation on Eliquis, adenocarcinoma of the lung, COPD on 2 L home oxygen and conditions listed below presenting to the hospital for evaluation of worsening renal function. Patient states she was seen by her PCP yesterday and lab work was done.  States results came back today and she was told her kidney function was worse.  She reports having nonbloody diarrhea/loose stools for the past 3 weeks.  States she was previously taking a stool softener and a laxative but stopped taking these 2 weeks ago but continues to have diarrhea.  States she was treated with an antibiotic 2 weeks ago.  Denies any recent travel.  Reports having generalized weakness.  States she is currently taking torsemide 40 mg in the morning and 20 mg in the afternoon.  She uses 2 L home oxygen all the time.  Reports having an episode of dysuria 2 days ago which resolved.  Denies having any urinary frequency or urgency. Denies having any fevers, chills, cough, shortness of breath, nausea, vomiting, or abdominal pain.  Denies any recent sick contacts.  Review of Systems: As per HPI otherwise 10 point review of systems negative.  Past Medical History:  Diagnosis Date   Anemia    Aortic atherosclerosis ([place]) 12/03/2017   Noted on CT scan   CAD (coronary artery disease), native coronary artery 12/03/2017   Noted on CT scan by calcification    CHF (congestive heart failure) (HCC)    Chronic diastolic heart failure ([place]) 12/03/2017   Dyspnea    GERD (gastroesophageal reflux disease)    Heart murmur    History of blood transfusion    "related to chemo" (12/05/2017)    Hypertension    Hypertensive heart disease 12/03/2017   Kidney disease, chronic, stage III (GFR 30-59 ml/min) ([place]) 12/03/2017   Lung cancer ([place]) dx'd 02/2010   Mitral valve prolapse    Morbid obesity ([place]) 12/03/2017   On home oxygen therapy    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   Paroxysmal atrial fibrillation ([place])    Persistent atrial fibrillation 12/03/2017   Restrictive lung disease 05/23/2017    Past Surgical History:  Procedure Laterality Date   BIOPSY  07/16/2018   Procedure: BIOPSY;  Surgeon: Mauri Pole, MD;  Location: [facility];  Service: Endoscopy;;   CARDIOVERSION N/A 12/06/2017   Procedure: CARDIOVERSION;  Surgeon: Jacolyn Reedy, MD;  Location: [facility];  Service: Cardiovascular;  Laterality: N/A;   CATARACT EXTRACTION W/ INTRAOCULAR LENS  IMPLANT, BILATERAL Bilateral    COLONOSCOPY WITH PROPOFOL N/A 07/16/2018   Procedure: COLONOSCOPY WITH PROPOFOL;  Surgeon: Mauri Pole, MD;  Location: [facility];  Service: Endoscopy;  Laterality: N/A;   DERMABRASION  ~ 1966   "face; for acne"   ESOPHAGOGASTRODUODENOSCOPY (EGD) WITH PROPOFOL N/A 07/16/2018   Procedure: ESOPHAGOGASTRODUODENOSCOPY (EGD) WITH PROPOFOL;  Surgeon: Mauri Pole, MD;  Location: [facility];  Service: Endoscopy;  Laterality: N/A;   ORIF ANKLE FRACTURE Left 12/06/2018   Procedure: OPEN REDUCTION INTERNAL FIXATION (ORIF) ANKLE FRACTURE;  Surgeon: Nicholes Stairs, MD;  Location: [facility];  Service: Orthopedics;  Laterality: Left;   PAROTID GLAND TUMOR EXCISION Left    POLYPECTOMY  07/16/2018  Procedure: POLYPECTOMY;  Surgeon: Mauri Pole, MD;  Location: [facility];  Service: Endoscopy;;   PORTACATH PLACEMENT  11/03/08-[place]   tip in mid SVC-[facility] [place]   THORACENTESIS Right    TONSILLECTOMY     VIDEO BRONCHOSCOPY WITH ENDOBRONCHIAL ULTRASOUND Right 05/08/2018   Procedure: VIDEO BRONCHOSCOPY WITH ENDOBRONCHIAL ULTRASOUND;  Surgeon: Collene Gobble, MD;  Location: [facility];  Service: Thoracic;  Laterality: Right;     reports that she quit smoking about 17 years ago. Her smoking use included cigarettes. She has a 80.00 pack-year smoking history. She has never used smokeless tobacco. She reports current alcohol use. She reports that she does not use drugs.  Allergies  Allergen Reactions   Septra Ds [Sulfamethoxazole-Trimethoprim] Nausea Only    Family History  Problem Relation Age of Onset   Breast cancer Mother    Heart attack Mother    Diabetes Father    Heart attack Father    Diabetes Brother    COPD Cousin    Colon cancer Neg Hx     Prior to Admission medications   Medication Sig Start Date End Date Taking? Authorizing Provider  acetaminophen (TYLENOL) 500 MG tablet Take 1,000 mg by mouth at bedtime.   Yes [provider]  amiodarone (PACERONE) 200 MG tablet Take 1 tablet (200 mg total) by mouth daily. 10/02/18  Yes Leonie Man, MD  ANORO ELLIPTA 62.5-25 MCG/INH AEPB INHALE 1 PUFF BY MOUTH INTO LUNGS ONCE DAILY Patient taking differently: Inhale 1 puff into the lungs daily.  09/19/18  Yes Collene Gobble, MD  apixaban (ELIQUIS) 5 MG TABS tablet Take 1 tablet (5 mg total) by mouth 2 (two) times daily. OK to restart on Friday 05/10/18. Patient taking differently: Take 5 mg by mouth 2 (two) times daily.  10/02/18  Yes Leonie Man, MD  calcitRIOL (ROCALTROL) 0.5 MCG capsule Take 0.5 mcg by mouth 3 (three) times daily.  09/27/18  Yes [provider]  carvedilol (COREG) 12.5 MG tablet Take 12.5 mg by mouth 2 (two) times daily with a meal.   Yes [provider]  ferrous sulfate 325 (65 FE) MG tablet Take 325 mg by mouth 3 (three) times daily with meals.    Yes [provider]  Melatonin 10 MG TABS Take 10 mg by mouth daily.   Yes [provider]  oxyCODONE (OXY IR/ROXICODONE) 5 MG immediate release tablet Take 1 tablet (5 mg total) by mouth every 4 (four) hours as  needed for moderate pain. 12/11/18  Yes Mikhail, Velta Addison, DO  Potassium Chloride ER 20 MEQ TBCR Take 40 mEq by mouth 2 (two) times daily.  01/27/19  Yes [provider]  torsemide (DEMADEX) 20 MG tablet Take 2 tablets (40 mg total) by mouth 2 (two) times daily with a meal. Patient taking differently: Take 20 mg by mouth daily.  07/31/18  Yes Leonie Man, MD  vitamin B-12 (CYANOCOBALAMIN) 1000 MCG tablet Take 1,000 mcg by mouth daily.   Yes [provider]  albuterol (PROAIR HFA) 108 (90 Base) MCG/ACT inhaler Inhale 2 puffs into the lungs every 4 (four) hours as needed for wheezing or shortness of breath. 07/08/18   Collene Gobble, MD  carvedilol (COREG) 25 MG tablet Take 1 tablet (25 mg total) by mouth 2 (two) times daily with a meal. Patient not taking: Reported on 02/13/2019 10/02/18   Leonie Man, MD  docusate sodium (COLACE) 100 MG capsule Take 1 capsule (100 mg total) by  mouth 2 (two) times daily. Patient not taking: Reported on 01/28/2019 12/11/18   Cristal Ford, DO  hydrALAZINE (APRESOLINE) 25 MG tablet Take 1 tablet (25 mg total) by mouth 2 (two) times daily. Patient not taking: Reported on 02/13/2019 10/02/18   Leonie Man, MD  OXYGEN Inhale 2 L into the lungs continuous.    [provider]  ranitidine (ZANTAC) 150 MG tablet Take 150 mg by mouth.  01/23/19   [provider]    Physical Exam: Vitals:   02/13/19 2115 02/13/19 2300 02/14/19 0010 02/14/19 0037  BP: (!) 122/57 139/67  (!) 147/64  Pulse: 72 82  83  Resp: 18 (!) 22  18  Temp:    99.5 F (37.5 C)  TempSrc:    Oral  SpO2: 100% 100%  100%  Weight:   110.2 kg   Height:   5\' 6"  (1.676 m)     Physical Exam  Constitutional: She is oriented to person, place, and time. She appears well-developed and well-nourished. No distress.  HENT:  Head: Normocephalic.  Mouth/Throat: Oropharynx is clear and moist.  Eyes: Right eye exhibits no discharge. Left eye exhibits no discharge.  Neck:  Neck supple.  Cardiovascular: Normal rate, regular rhythm and intact distal pulses.  Pulmonary/Chest: Effort normal and breath sounds normal. No respiratory distress. She has no wheezes. She has no rales.  On 2 L supplemental oxygen (chronic per patient) Decreased breath sounds at the right lung base  Abdominal: Soft. Bowel sounds are normal. She exhibits no distension. There is no abdominal tenderness. There is no rebound and no guarding.  Musculoskeletal:        General: Edema present.     Comments: Trace bilateral lower extremity edema  Neurological: She is alert and oriented to person, place, and time.  Skin: Skin is warm and dry. She is not diaphoretic.  Erythematous skin patches noted on bilateral lower extremities and left upper extremity.  Chronic per patient.     Labs on Admission: I have personally reviewed following labs and imaging studies  CBC: Recent Labs  Lab 02/13/19 1955  WBC 5.2  NEUTROABS 3.2  HGB 10.2*  HCT 33.4*  MCV 105.0*  PLT 132   Basic Metabolic Panel: Recent Labs  Lab 02/13/19 1955  NA 135  K 4.5  CL 96*  CO2 30  GLUCOSE 122*  BUN 33*  CREATININE 2.86*  CALCIUM 10.2  MG 2.5*  PHOS 3.6   GFR: Estimated Creatinine Clearance: 20.4 mL/min (A) (by C-G formula based on SCr of 2.86 mg/dL (H)). Liver Function Tests: Recent Labs  Lab 02/13/19 1955  AST 17  ALT 13  ALKPHOS 51  BILITOT 0.9  PROT 6.5  ALBUMIN 3.3*   No results for input(s): LIPASE, AMYLASE in the last 168 hours. No results for input(s): AMMONIA in the last 168 hours. Coagulation Profile: Recent Labs  Lab 02/13/19 1955  INR 1.8*   Cardiac Enzymes: No results for input(s): CKTOTAL, CKMB, CKMBINDEX, TROPONINI in the last 168 hours. BNP (last 3 results) No results for input(s): PROBNP in the last 8760 hours. HbA1C: No results for input(s): HGBA1C in the last 72 hours. CBG: No results for input(s): GLUCAP in the last 168 hours. Lipid Profile: No results for input(s):  CHOL, HDL, LDLCALC, TRIG, CHOLHDL, LDLDIRECT in the last 72 hours. Thyroid Function Tests: No results for input(s): TSH, T4TOTAL, FREET4, T3FREE, THYROIDAB in the last 72 hours. Anemia Panel: No results for input(s): VITAMINB12, FOLATE, FERRITIN, TIBC, IRON, RETICCTPCT  in the last 72 hours. Urine analysis:    Component Value Date/Time   COLORURINE YELLOW 01/28/2019 1303   APPEARANCEUR CLEAR 01/28/2019 1303   LABSPEC 1.008 01/28/2019 1303   PHURINE 6.0 01/28/2019 1303   GLUCOSEU NEGATIVE 01/28/2019 1303   HGBUR NEGATIVE 01/28/2019 1303   BILIRUBINUR NEGATIVE 01/28/2019 1303   KETONESUR NEGATIVE 01/28/2019 1303   PROTEINUR NEGATIVE 01/28/2019 1303   UROBILINOGEN 1.0 12/05/2009 1303   NITRITE NEGATIVE 01/28/2019 1303   LEUKOCYTESUR SMALL (A) 01/28/2019 1303    Radiological Exams on Admission: Dg Chest 2 View  Result Date: 02/13/2019 CLINICAL DATA:  Weakness EXAM: CHEST - 2 VIEW COMPARISON:  01/28/2019 FINDINGS: Left chest wall Port-A-Cath tip projects over the lower SVC, unchanged. There is a small right pleural effusion with right basilar atelectasis. Postsurgical findings of the right hemithorax. Left lung is clear. IMPRESSION: Unchanged chest radiograph postsurgical changes of the right lung and small pleural effusion. Electronically Signed   By: Ulyses Jarred M.D.   On: 02/13/2019 20:34    EKG: Pending at this time.  Assessment/Plan Principal Problem:   AKI (acute kidney injury) ([place]) Active Problems:   Chronic diastolic heart failure (HCC)   COPD (chronic obstructive pulmonary disease) (HCC)   General weakness   Diarrhea   AKI on CKD III-IV -Likely prerenal secondary to dehydration from diarrhea and concomitant home torsemide use. -BUN 33.  Creatinine 2.8, baseline 1.6-1.7. -IV fluid hydration -Continue to monitor BMP -Monitor urine output -Avoid nephrotoxic agents/contrast -Hold torsemide -Renal ultrasound -Check UA  Generalized weakness -Infectious etiology less  likely as patient is afebrile and no leukocytosis. Chest x-ray not suggestive of pneumonia. Reports an episode of dysuria 2 days ago but not at present. Check UA. -Has chronic macrocytic anemia, however, hemoglobin at baseline. -Home beta-blocker use could also be possibly contributing. -PT evaluation  Diarrhea -Afebrile and no leukocytosis.  No episodes of diarrhea in the hospital. -Patient reports antibiotic use 2 weeks ago.  Check C. difficile PCR and GI pathogen panel if recurrence of diarrhea in the hospital.  Persistent atrial fibrillation -Continue amiodarone and Coreg for rate control.  Continue Eliquis for anticoagulation (renally dosed).  COPD on 2 L home oxygen -Stable.  No wheezing or shortness of breath.  Continue home inhalers.  Hypertension -Continue Coreg  Chronic diastolic congestive heart failure -Does not appear volume overloaded on exam.  Hold home torsemide given AKI.  Continue Coreg.  Persistent small right pleural effusion with right basilar atelectasis -Seen on chest x-ray done in January 2020 as well.  No fever or leukocytosis.  Her home oxygen requirement has not changed. -Continue supplemental oxygen -Continue to monitor  Chronic pain -Continue home oxycodone 5 mg every 4 hours as needed  DVT prophylaxis: Eliquis Code Status: Patient wishes to be full code. Family Communication: No family available. Disposition Plan: Anticipate discharge after clinical improvement. Consults called: None Admission status: Observation, MedSurg  This chart was dictated using voice recognition software.  Despite best efforts to proofread, errors can occur which can change the documentation meaning.  Shela Leff MD [facility] Pager [phone]  If 7PM-7AM, please contact night-coverage [email or web address] Password [email or web address]  02/14/2019, 1:56 AM

## 2019-02-14 ENCOUNTER — Observation Stay (HOSPITAL_COMMUNITY): Payer: Medicare Other

## 2019-02-14 DIAGNOSIS — R197 Diarrhea, unspecified: Secondary | ICD-10-CM

## 2019-02-14 DIAGNOSIS — N179 Acute kidney failure, unspecified: Secondary | ICD-10-CM | POA: Diagnosis not present

## 2019-02-14 DIAGNOSIS — R531 Weakness: Secondary | ICD-10-CM

## 2019-02-14 LAB — URINALYSIS, ROUTINE W REFLEX MICROSCOPIC
Bilirubin Urine: NEGATIVE
Glucose, UA: NEGATIVE mg/dL
Ketones, ur: NEGATIVE mg/dL
Nitrite: NEGATIVE
Protein, ur: NEGATIVE mg/dL
SPECIFIC GRAVITY, URINE: 1.01 (ref 1.005–1.030)
WBC, UA: >50 WBC/hpf — ABNORMAL HIGH (ref 0–5)
pH: 5 (ref 5.0–8.0)

## 2019-02-14 LAB — BASIC METABOLIC PANEL
ANION GAP: 6 (ref 5–15)
BUN: 32 mg/dL — ABNORMAL HIGH (ref 8–23)
CO2: 29 mmol/L (ref 22–32)
Calcium: 9.7 mg/dL (ref 8.9–10.3)
Chloride: 101 mmol/L (ref 98–111)
Creatinine, Ser: 2.76 mg/dL — ABNORMAL HIGH (ref 0.44–1.00)
GFR calc Af Amer: 18 mL/min — ABNORMAL LOW (ref >60)
GFR calc non Af Amer: 16 mL/min — ABNORMAL LOW (ref >60)
Glucose, Bld: 96 mg/dL (ref 70–99)
Potassium: 4.1 mmol/L (ref 3.5–5.1)
SODIUM: 136 mmol/L (ref 135–145)

## 2019-02-14 MED ORDER — APIXABAN 2.5 MG PO TABS
2.5000 mg | ORAL_TABLET | Freq: Two times a day (BID) | ORAL | Status: DC
  Administered 2019-02-15: 2.5 mg via ORAL
  Filled 2019-02-14: qty 1

## 2019-02-14 MED ORDER — SODIUM CHLORIDE 0.9 % IV SOLN
INTRAVENOUS | Status: DC
  Administered 2019-02-14 (×2): via INTRAVENOUS
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
## 2019-02-14 NOTE — Progress Notes (Signed)
Physical Therapy Treatment Patient Details Name: Diane [name] MRN: [id number] DOB: [birth year] Today's Date: 02/14/2019    History of Present Illness Pt admitted with abnormal labs.  Pt is a 78 y.o. female with PMH of COPD (2L O2 home), CHF, A. fib on Eliquis, CAD, HTN, HLD, and lung CA s/p chemo, L ankle fx 11/29/18.   Pt at [facility] until recently and now at home with spouse and aides 8 hrs/day.    PT Comments    Pt cooperative but continues to require significant assist for all mobility tasks.  Pt from recliner to [facility] to bed this pm and ambulated short distance in room.   Follow Up Recommendations  SNF;Home health PT     Equipment Recommendations  None recommended by PT    Recommendations for Other Services OT consult     Precautions / Restrictions Precautions Precautions: Fall Restrictions Weight Bearing Restrictions: Yes Other Position/Activity Restrictions: Pt states ltd WB allowed on LE but no physician order     Mobility  Bed Mobility Overal bed mobility: Needs Assistance Bed Mobility: Sit to Supine     Supine to sit: Min assist;+2 for physical assistance;+2 for safety/equipment Sit to supine: Min assist;Mod assist;+2 for physical assistance;+2 for safety/equipment   General bed mobility comments: increased time with use of bed rails; min assist to manage L LE and to control trunk  Transfers Overall transfer level: Needs assistance Equipment used: Rolling walker (2 wheeled) Transfers: Sit to/from Stand Sit to Stand: Mod assist;+2 physical assistance Stand pivot transfers: Mod assist;+2 physical assistance;+2 safety/equipment       General transfer comment: cues for LE management and use of UEs to self assist.  mod assist from recliner and [facility]  Ambulation/Gait Ambulation/Gait assistance: Min assist;+2 physical assistance;+2 safety/equipment Gait Distance (Feet): 7 Feet Assistive device: Rolling walker (2 wheeled) Gait Pattern/deviations: Step-to  pattern;Decreased step length - right;Decreased step length - left;Shuffle;Trunk flexed Gait velocity: decr   General Gait Details: INcreased time with cues for sequence, posture, position from RW and increased UE WB   Stairs             Wheelchair Mobility    Modified Rankin (Stroke Patients Only)       Balance Overall balance assessment: Needs assistance Sitting-balance support: No upper extremity supported;Feet supported Sitting balance-Leahy Scale: Good     Standing balance support: Bilateral upper extremity supported Standing balance-Leahy Scale: Poor                              Cognition Arousal/Alertness: Awake/alert Behavior During Therapy: WFL for tasks assessed/performed Overall Cognitive Status: Within Functional Limits for tasks assessed                                        Exercises      General Comments        Pertinent Vitals/Pain Pain Assessment: No/denies pain    Home Living Family/patient expects to be discharged to:: Private residence Living Arrangements: Spouse/significant other Available Help at Discharge: Family;Available 24 hours/day Type of Home: House Home Access: Stairs to enter Entrance Stairs-Rails: None Home Layout: One level Home Equipment: Environmental consultant - 2 wheels;Bedside commode;Wheelchair - manual Additional Comments: Aide 8 hrs/day     Prior Function Level of Independence: Needs assistance  Gait / Transfers Assistance Needed: Very limited ambulation   Comments:  used RW for very limited distance; WC   PT Goals (current goals can now be found in the care plan section) Acute Rehab PT Goals Patient Stated Goal: Be able to walk again PT Goal Formulation: With patient Time For Goal Achievement: 02/28/19 Potential to Achieve Goals: Fair Progress towards PT goals: Progressing toward goals    Frequency    Min 3X/week      PT Plan Current plan remains appropriate    Co-evaluation               AM-PAC PT "6 Clicks" Mobility   Outcome Measure  Help needed turning from your back to your side while in a flat bed without using bedrails?: A Little Help needed moving from lying on your back to sitting on the side of a flat bed without using bedrails?: A Lot Help needed moving to and from a bed to a chair (including a wheelchair)?: A Lot Help needed standing up from a chair using your arms (e.g., wheelchair or bedside chair)?: A Lot Help needed to walk in hospital room?: A Lot Help needed climbing 3-5 steps with a railing? : Total 6 Click Score: 12    End of Session Equipment Utilized During Treatment: Gait belt Activity Tolerance: Patient tolerated treatment well Patient left: in bed;with call bell/phone within reach Nurse Communication: Mobility status PT Visit Diagnosis: Difficulty in walking, not elsewhere classified (R26.2);Muscle weakness (generalized) (M62.81)     Time: 3710-6269 PT Time Calculation (min) (ACUTE ONLY): 20 min  Charges:  $Gait Training: 8-22 mins $Therapeutic Activity: 8-22 mins                     [place] Pager [phone] Office [phone]    Cortlandt Capuano 02/14/2019, 1:51 PM

## 2019-02-14 NOTE — Evaluation (Signed)
Physical Therapy Evaluation Patient Details Name: Diane [name] MRN: [id number] DOB: [birth year] Today's Date: 02/14/2019   History of Present Illness  Pt admitted with abnormal labs.  Pt is a 78 y.o. female with PMH of COPD (2L O2 home), CHF, A. fib on Eliquis, CAD, HTN, HLD, and lung CA s/p chemo, L ankle fx 11/29/18.   Pt at [facility] until recently and now at home with spouse and aides 8 hrs/day.  Clinical Impression  Pt admitted as above and presenting with functional mobility limitations 2* generalized weakness, balance deficits, obesity and limited WB on L LE.  Pt would benefit from SNF level rehab but states unable to manage co-pay.  Next best option home to previous living arrangement with continued HHPT and aide.    Follow Up Recommendations SNF;Home health PT    Equipment Recommendations  None recommended by PT    Recommendations for Other Services OT consult     Precautions / Restrictions Precautions Precautions: Fall Restrictions Weight Bearing Restrictions: Yes Other Position/Activity Restrictions: Pt states ltd WB allowed on LE but no physician order       Mobility  Bed Mobility Overal bed mobility: Needs Assistance Bed Mobility: Supine to Sit     Supine to sit: Min assist;+2 for physical assistance;+2 for safety/equipment     General bed mobility comments: increased time with use of bed rails; min assist to manage L LE and to bring trunk to upright  Transfers Overall transfer level: Needs assistance Equipment used: Rolling walker (2 wheeled) Transfers: Sit to/from Stand Sit to Stand: Mod assist;+2 physical assistance         General transfer comment: cues for LE management and use of UEs to self assist.  min/mod assist off EOB; mod assist from recliner  Ambulation/Gait Ambulation/Gait assistance: Min assist;+2 physical assistance;+2 safety/equipment Gait Distance (Feet): 13 Feet Assistive device: Rolling walker (2 wheeled) Gait Pattern/deviations:  Step-to pattern;Decreased step length - right;Decreased step length - left;Shuffle;Trunk flexed Gait velocity: decr   General Gait Details: INcreased time with cues for sequence, posture, position from RW and increased UE WB  Stairs            Wheelchair Mobility    Modified Rankin (Stroke Patients Only)       Balance Overall balance assessment: Needs assistance Sitting-balance support: No upper extremity supported;Feet supported Sitting balance-Leahy Scale: Good     Standing balance support: Bilateral upper extremity supported Standing balance-Leahy Scale: Poor                               Pertinent Vitals/Pain Pain Assessment: No/denies pain    Home Living Family/patient expects to be discharged to:: Private residence Living Arrangements: Spouse/significant other Available Help at Discharge: Family;Available 24 hours/day Type of Home: House Home Access: Stairs to enter Entrance Stairs-Rails: None Entrance Stairs-Number of Steps: 2 Home Layout: One level Home Equipment: Walker - 2 wheels;Bedside commode;Wheelchair - manual Additional Comments: Aide 8 hrs/day     Prior Function Level of Independence: Needs assistance   Gait / Transfers Assistance Needed: Very limited ambulation     Comments: used RW for very limited distance; WC     Hand Dominance   Dominant Hand: Right    Extremity/Trunk Assessment   Upper Extremity Assessment Upper Extremity Assessment: Generalized weakness    Lower Extremity Assessment Lower Extremity Assessment: Generalized weakness;LLE deficits/detail LLE Deficits / Details: cam boot in place for OOB  Cervical / Trunk Assessment Cervical / Trunk Assessment: Normal  Communication   Communication: No difficulties  Cognition Arousal/Alertness: Awake/alert Behavior During Therapy: WFL for tasks assessed/performed Overall Cognitive Status: Within Functional Limits for tasks assessed                                         General Comments      Exercises     Assessment/Plan    PT Assessment Patient needs continued PT services  PT Problem List Decreased strength;Decreased range of motion;Decreased activity tolerance;Decreased balance;Decreased mobility;Decreased knowledge of use of DME;Obesity       PT Treatment Interventions DME instruction;Gait training;Functional mobility training;Therapeutic activities;Therapeutic exercise;Patient/family education    PT Goals (Current goals can be found in the Care Plan section)  Acute Rehab PT Goals Patient Stated Goal: Be able to walk again PT Goal Formulation: With patient Time For Goal Achievement: 02/28/19 Potential to Achieve Goals: Fair    Frequency Min 3X/week   Barriers to discharge Decreased caregiver support      Co-evaluation               AM-PAC PT "6 Clicks" Mobility  Outcome Measure Help needed turning from your back to your side while in a flat bed without using bedrails?: A Little Help needed moving from lying on your back to sitting on the side of a flat bed without using bedrails?: A Lot Help needed moving to and from a bed to a chair (including a wheelchair)?: A Lot Help needed standing up from a chair using your arms (e.g., wheelchair or bedside chair)?: A Lot Help needed to walk in hospital room?: A Lot Help needed climbing 3-5 steps with a railing? : Total 6 Click Score: 12    End of Session Equipment Utilized During Treatment: Gait belt;Other (comment)(cam boot on R) Activity Tolerance: Patient tolerated treatment well Patient left: in chair;with call bell/phone within reach;with nursing/sitter in room Nurse Communication: Mobility status PT Visit Diagnosis: Difficulty in walking, not elsewhere classified (R26.2);Muscle weakness (generalized) (M62.81)    Time: 4944-9675 PT Time Calculation (min) (ACUTE ONLY): 39 min   Charges:   PT Evaluation $PT Eval Low Complexity: 1 Low PT  Treatments $Gait Training: 8-22 mins        Debe Coder PT Acute Rehabilitation Services Pager [phone] Office [phone]   Diane [name] 02/14/2019, 1:43 PM

## 2019-02-15 DIAGNOSIS — N179 Acute kidney failure, unspecified: Secondary | ICD-10-CM | POA: Diagnosis not present

## 2019-02-15 LAB — BASIC METABOLIC PANEL
ANION GAP: 6 (ref 5–15)
BUN: 29 mg/dL — ABNORMAL HIGH (ref 8–23)
CO2: 27 mmol/L (ref 22–32)
Calcium: 10.1 mg/dL (ref 8.9–10.3)
Chloride: 103 mmol/L (ref 98–111)
Creatinine, Ser: 2.26 mg/dL — ABNORMAL HIGH (ref 0.44–1.00)
GFR calc Af Amer: 23 mL/min — ABNORMAL LOW (ref >60)
GFR calc non Af Amer: 20 mL/min — ABNORMAL LOW (ref >60)
Glucose, Bld: 91 mg/dL (ref 70–99)
POTASSIUM: 3.7 mmol/L (ref 3.5–5.1)
Sodium: 136 mmol/L (ref 135–145)

## 2019-02-15 MED ORDER — POLYETHYLENE GLYCOL 3350 17 G PO PACK
17.0000 g | PACK | Freq: Every day | ORAL | Status: DC | PRN
  Administered 2019-02-16 – 2019-02-17 (×2): 17 g via ORAL
  Filled 2019-02-15 (×3): qty 1

## 2019-02-15 MED ORDER — APIXABAN 5 MG PO TABS
5.0000 mg | ORAL_TABLET | Freq: Two times a day (BID) | ORAL | Status: DC
  Administered 2019-02-15 – 2019-02-18 (×6): 5 mg via ORAL
  Filled 2019-02-15 (×6): qty 1

## 2019-02-15 NOTE — NC FL2 (Signed)
[place] LEVEL OF CARE SCREENING TOOL     IDENTIFICATION  Patient Name: Diane [name] Birthdate: 1941-08-22 Sex: female Admission Date (Current Location): 02/13/2019  [facility] and [place] Number:  Herbalist and Address:  [facility],  [place] [address] [place]      Provider Number: [id number]  Attending Physician Name and Address:  Barb Merino, MD  Relative Name and Phone Number:       Current Level of Care: Hospital Recommended Level of Care: [place] Prior Approval Number:    Date Approved/Denied:   PASRR Number: [id number]  Discharge Plan: SNF    Current Diagnoses: Patient Active Problem List   Diagnosis Date Noted  . General weakness 02/14/2019  . Diarrhea 02/14/2019  . AKI (acute kidney injury) ([place]) 02/13/2019  . Fall at home, initial encounter 11/30/2018  . Trimalleolar fracture of ankle, closed, left, initial encounter 11/29/2018  . Polyp of cecum   . Polyp of ascending colon   . Polyp of transverse colon   . Polyp of descending colon   . Polyp of rectum   . Heme positive stool 06/11/2018  . Oxygen dependent 06/11/2018  . Status post bronchoscopy with biopsy   . Anemia 04/25/2018  . Chronic respiratory failure with hypoxia ([place]) 01/17/2018  . Hypotension 12/13/2017  . COPD (chronic obstructive pulmonary disease) ([place]) 12/11/2017  . Chronic diastolic heart failure ([place]) 12/03/2017  . Persistent atrial fibrillation; CHA2DS2Vacs = 6; Eliquis; Amiodarone for rhythm control 12/03/2017  . Chronic anticoagulation 12/03/2017  . Morbid obesity ([place]) 12/03/2017  . Kidney disease, chronic, stage III (GFR 30-59 ml/min) (HCC) 12/03/2017  . Hypertensive heart and chronic kidney disease with heart failure and stage 1 through stage 4 chronic kidney disease, or chronic kidney disease ([place]) 12/03/2017  . Coronary artery disease involving native coronary artery of native heart without angina pectoris  12/03/2017  . Aortic atherosclerosis ([place]) 12/03/2017  . Restrictive lung disease 05/23/2017  . Port catheter in place 2020-06-316  . Cancer of right lung parenchyma ([place]) 10/17/2011    Orientation RESPIRATION BLADDER Height & Weight     Self, Time, Situation, Place  O2 Incontinent, External catheter Weight: 242 lb 15.2 oz (110.2 kg) Height:  5\' 6"  (167.6 cm)  BEHAVIORAL SYMPTOMS/MOOD NEUROLOGICAL BOWEL NUTRITION STATUS      Continent Diet(See dc summary)  AMBULATORY STATUS COMMUNICATION OF NEEDS Skin   Extensive Assist Verbally Normal                       Personal Care Assistance Level of Assistance  Bathing, Feeding, Dressing Bathing Assistance: Limited assistance Feeding assistance: Independent Dressing Assistance: Limited assistance     Functional Limitations Info  Sight, Hearing, Speech Sight Info: Adequate Hearing Info: Adequate Speech Info: Adequate    SPECIAL CARE FACTORS FREQUENCY  PT (By licensed PT), OT (By licensed OT)     PT Frequency: 5x/week OT Frequency: 5x/week            Contractures Contractures Info: Not present    Additional Factors Info  Code Status, Allergies Code Status Info: Full Allergies Info: Septra Ds Sulfamethoxazole-trimethoprim           Current Medications (02/15/2019):  This is the current hospital active medication list Current Facility-Administered Medications  Medication Dose Route Frequency Provider Last Rate Last Dose  . acetaminophen (TYLENOL) tablet 650 mg  650 mg Oral Q6H PRN Shela Leff, MD  Or  . acetaminophen (TYLENOL) suppository 650 mg  650 mg Rectal Q6H PRN Shela Leff, MD      . albuterol (PROVENTIL) (2.5 MG/3ML) 0.083% nebulizer solution 2.5 mg  2.5 mg Nebulization Q6H PRN Shela Leff, MD      . amiodarone (PACERONE) tablet 200 mg  200 mg Oral Daily Shela Leff, MD   200 mg at 02/15/19 1054  . apixaban (ELIQUIS) tablet 5 mg  5 mg Oral BID Barb Merino, MD      .  calcitRIOL (ROCALTROL) capsule 0.5 mcg  0.5 mcg Oral TID Shela Leff, MD   0.5 mcg at 02/15/19 1054  . carvedilol (COREG) tablet 12.5 mg  12.5 mg Oral BID WC Shela Leff, MD   12.5 mg at 02/15/19 1054  . ferrous sulfate tablet 325 mg  325 mg Oral TID WC Shela Leff, MD   325 mg at 02/15/19 1054  . Melatonin TABS 10 mg  10 mg Oral QHS Shela Leff, MD   10 mg at 02/14/19 2238  . oxyCODONE (Oxy IR/ROXICODONE) immediate release tablet 5 mg  5 mg Oral Q4H PRN Shela Leff, MD   5 mg at 02/15/19 0204  . umeclidinium-vilanterol (ANORO ELLIPTA) 62.5-25 MCG/INH 1 puff  1 puff Inhalation Daily Shela Leff, MD   1 puff at 02/15/19 0847  . vitamin B-12 (CYANOCOBALAMIN) tablet 1,000 mcg  1,000 mcg Oral Daily Shela Leff, MD   1,000 mcg at 02/15/19 1054   Facility-Administered Medications Ordered in Other Encounters  Medication Dose Route Frequency Provider Last Rate Last Dose  . sodium chloride 0.9 % injection 10 mL  10 mL Intravenous PRN Curt Bears, MD   10 mL at 10/14/13 1030     Discharge Medications: Please see discharge summary for a list of discharge medications.  Relevant Imaging Results:  Relevant Lab Results:   Additional Information SS#[id number] [place]

## 2019-02-15 NOTE — TOC Progression Note (Signed)
Transition of Care [facility]) - Progression Note    Patient Details  Name: NIASIA LANPHEAR MRN: [id number] Date of Birth: August 17, 1941  [facility]) CM/SW Contact  Servando Snare, [place] Phone Number: 02/15/2019, 1:50 PM  Clinical Narrative:   Patient now wants to go to SNF. Patient is unsure of how many days she has uses. Patient dc to SNF in Jan and dc about a week a go. Patient is in copay days and has a copay of $176 per day. LCSW discussed co pay with patient. Patient is agreeable to [facility] cop[facility] at [facility].   LCSW faxed patient out. Patient will need auth prior to dcing to facility.     Expected Discharge Plan: [place]    Expected Discharge Plan and Services Expected Discharge Plan: [place] arrangements for the past 2 months: [place] Agency: (pt reports having [facility] currently)   Social Determinants of Health (SDOH) Interventions    Readmission Risk Interventions Readmission Risk Prevention Plan 02/14/2019  Transportation Screening Complete  Medication Review Press photographer) Complete  PCP or Specialist appointment within 3-5 days of discharge Complete  HRI or [place] Complete  SW Recovery Care/Counseling Consult Complete  Palliative Care Screening Not [place] Complete  Some recent data might be hidden

## 2019-02-15 NOTE — Progress Notes (Signed)
Physical Therapy Treatment Patient Details Name: Diane [name] MRN: [id number] DOB: 07/30/1941 Today's Date: 02/15/2019    History of Present Illness Pt admitted with abnormal labs.  Pt is a 78 y.o. female with PMH of COPD (2L O2 home), CHF, A. fib on Eliquis, CAD, HTN, HLD, and lung CA s/p chemo, L ankle fx 11/29/18.   Pt at [facility] until recently and now at home with spouse and aides 8 hrs/day.    PT Comments    Pt c/o dizziness with transitions (RN aware) but motivated to participate.  Pt continues anxious and requiring increased time for all tasks but ambulated increased distance and with decreased physical assist for all activities vs yesterday.   Follow Up Recommendations  SNF;Home health PT     Equipment Recommendations  None recommended by PT    Recommendations for Other Services OT consult     Precautions / Restrictions Precautions Precautions: Fall Required Braces or Orthoses: Other Brace(Cam boot on L ) Restrictions Weight Bearing Restrictions: Yes LLE Weight Bearing: Weight bearing as tolerated Other Position/Activity Restrictions: Pt states ltd WB allowed on LE but no physician order     Mobility  Bed Mobility Overal bed mobility: Needs Assistance Bed Mobility: Supine to Sit;Sit to Supine     Supine to sit: Min assist Sit to supine: Min assist;+2 for physical assistance;+2 for safety/equipment   General bed mobility comments: increased time with use of bed rails; min assist to manage L LE and to control trunk  Transfers Overall transfer level: Needs assistance Equipment used: Rolling walker (2 wheeled) Transfers: Sit to/from Stand Sit to Stand: Min assist;Mod assist;From elevated surface;+2 physical assistance         General transfer comment: cues for LE management and use of UEs to self assist.  Pt stood x 3 from EOB  Ambulation/Gait Ambulation/Gait assistance: Min assist;+2 safety/equipment Gait Distance (Feet): 12 Feet(and 3 ft side-stepping  up bedside) Assistive device: Rolling walker (2 wheeled) Gait Pattern/deviations: Step-to pattern;Decreased step length - right;Decreased step length - left;Shuffle;Trunk flexed Gait velocity: decr   General Gait Details: INcreased time with cues for sequence, posture, position from RW and increased UE WB   Stairs             Wheelchair Mobility    Modified Rankin (Stroke Patients Only)       Balance Overall balance assessment: Needs assistance Sitting-balance support: No upper extremity supported;Feet supported Sitting balance-Leahy Scale: Good     Standing balance support: Bilateral upper extremity supported Standing balance-Leahy Scale: Poor                              Cognition Arousal/Alertness: Awake/alert Behavior During Therapy: WFL for tasks assessed/performed Overall Cognitive Status: Within Functional Limits for tasks assessed                                        Exercises      General Comments        Pertinent Vitals/Pain Pain Assessment: No/denies pain    Home Living                      Prior Function            PT Goals (current goals can now be found in the care plan section) Acute Rehab PT Goals  Patient Stated Goal: Be able to walk again PT Goal Formulation: With patient Time For Goal Achievement: 02/28/19 Potential to Achieve Goals: Fair Progress towards PT goals: Progressing toward goals    Frequency    Min 3X/week      PT Plan Current plan remains appropriate    Co-evaluation              AM-PAC PT "6 Clicks" Mobility   Outcome Measure  Help needed turning from your back to your side while in a flat bed without using bedrails?: A Little Help needed moving from lying on your back to sitting on the side of a flat bed without using bedrails?: A Little Help needed moving to and from a bed to a chair (including a wheelchair)?: A Lot Help needed standing up from a chair using  your arms (e.g., wheelchair or bedside chair)?: A Lot Help needed to walk in hospital room?: A Little Help needed climbing 3-5 steps with a railing? : Total 6 Click Score: 14    End of Session Equipment Utilized During Treatment: Gait belt Activity Tolerance: Patient tolerated treatment well Patient left: in bed;with call bell/phone within reach Nurse Communication: Mobility status PT Visit Diagnosis: Difficulty in walking, not elsewhere classified (R26.2);Muscle weakness (generalized) (M62.81)     Time: 5277-8242 PT Time Calculation (min) (ACUTE ONLY): 38 min  Charges:  $Gait Training: 8-22 mins $Therapeutic Activity: 8-22 mins                     Debe Coder PT Acute Rehabilitation Services Pager [phone] Office [phone]    Mounir Skipper 02/15/2019, 12:27 PM

## 2019-02-15 NOTE — Progress Notes (Signed)
Physical Therapy Treatment Patient Details Name: Diane [name] MRN: [id number] DOB: 05-10-1941 Today's Date: 02/15/2019    History of Present Illness Pt admitted with abnormal labs.  Pt is a 78 y.o. female with PMH of COPD (2L O2 home), CHF, A. fib on Eliquis, CAD, HTN, HLD, and lung CA s/p chemo, L ankle fx 11/29/18.   Pt at [facility] until recently and now at home with spouse and aides 8 hrs/day.    PT Comments    Pt cooperative and with continued anxiety but ambulating increased distance this pm as well of utilizing BSC before return to bed.  Follow Up Recommendations  SNF;Home health PT     Equipment Recommendations  None recommended by PT    Recommendations for Other Services OT consult     Precautions / Restrictions Precautions Precautions: Fall Required Braces or Orthoses: Other Brace(cam boot on L) Restrictions Weight Bearing Restrictions: Yes LLE Weight Bearing: Weight bearing as tolerated Other Position/Activity Restrictions: Pt states ltd WB allowed on LE but no physician order     Mobility  Bed Mobility Overal bed mobility: Needs Assistance Bed Mobility: Supine to Sit;Sit to Supine     Supine to sit: Min assist Sit to supine: Min assist;Mod assist   General bed mobility comments: increased time with use of bed rails; assist to manage  LEs   Transfers Overall transfer level: Needs assistance Equipment used: Rolling walker (2 wheeled) Transfers: Sit to/from Stand Sit to Stand: Min assist;Mod assist;From elevated surface;+2 physical assistance         General transfer comment: cues for LE management and use of UEs to self assist.  Pt to/from EOB and BSC  Ambulation/Gait Ambulation/Gait assistance: Min assist;+2 safety/equipment Gait Distance (Feet): 32 Feet Assistive device: Rolling walker (2 wheeled) Gait Pattern/deviations: Step-to pattern;Decreased step length - right;Decreased step length - left;Shuffle;Trunk flexed Gait velocity: decr    General Gait Details: INcreased time with cues for sequence, posture, position from [employer]             Wheelchair Mobility    Modified Rankin (Stroke Patients Only)       Balance Overall balance assessment: Needs assistance Sitting-balance support: No upper extremity supported;Feet supported Sitting balance-Leahy Scale: Good     Standing balance support: Bilateral upper extremity supported Standing balance-Leahy Scale: Poor                              Cognition Arousal/Alertness: Awake/alert Behavior During Therapy: WFL for tasks assessed/performed Overall Cognitive Status: Within Functional Limits for tasks assessed                                        Exercises      General Comments        Pertinent Vitals/Pain Pain Assessment: No/denies pain    Home Living                      Prior Function            PT Goals (current goals can now be found in the care plan section) Acute Rehab PT Goals Patient Stated Goal: Be able to walk again PT Goal Formulation: With patient Time For Goal Achievement: 02/28/19 Potential to Achieve Goals: Fair Progress towards PT goals: Progressing toward goals    Frequency  Min 3X/week      PT Plan Current plan remains appropriate    Co-evaluation              AM-PAC PT "6 Clicks" Mobility   Outcome Measure  Help needed turning from your back to your side while in a flat bed without using bedrails?: A Little Help needed moving from lying on your back to sitting on the side of a flat bed without using bedrails?: A Little Help needed moving to and from a bed to a chair (including a wheelchair)?: A Lot Help needed standing up from a chair using your arms (e.g., wheelchair or bedside chair)?: A Lot Help needed to walk in hospital room?: A Little Help needed climbing 3-5 steps with a railing? : Total 6 Click Score: 14    End of Session Equipment Utilized  During Treatment: Gait belt Activity Tolerance: Patient tolerated treatment well Patient left: in bed;with call bell/phone within reach Nurse Communication: Mobility status PT Visit Diagnosis: Difficulty in walking, not elsewhere classified (R26.2);Muscle weakness (generalized) (M62.81)     Time: 2122-4825 PT Time Calculation (min) (ACUTE ONLY): 28 min  Charges:  $Gait Training: 8-22 mins $Therapeutic Activity: 8-22 mins                     [place] Pager [phone] Office [phone]    [facility] 02/15/2019, 4:38 PM
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
## 2019-02-16 DIAGNOSIS — N179 Acute kidney failure, unspecified: Secondary | ICD-10-CM | POA: Diagnosis not present

## 2019-02-16 LAB — BASIC METABOLIC PANEL
Anion gap: 7 (ref 5–15)
BUN: 24 mg/dL — ABNORMAL HIGH (ref 8–23)
CHLORIDE: 102 mmol/L (ref 98–111)
CO2: 27 mmol/L (ref 22–32)
Calcium: 10.8 mg/dL — ABNORMAL HIGH (ref 8.9–10.3)
Creatinine, Ser: 1.92 mg/dL — ABNORMAL HIGH (ref 0.44–1.00)
GFR calc Af Amer: 28 mL/min — ABNORMAL LOW (ref >60)
GFR calc non Af Amer: 25 mL/min — ABNORMAL LOW (ref >60)
Glucose, Bld: 94 mg/dL (ref 70–99)
POTASSIUM: 4 mmol/L (ref 3.5–5.1)
Sodium: 136 mmol/L (ref 135–145)

## 2019-02-16 MED ORDER — TORSEMIDE 20 MG PO TABS
20.0000 mg | ORAL_TABLET | Freq: Every day | ORAL | Status: DC
  Administered 2019-02-16 – 2019-02-18 (×3): 20 mg via ORAL
  Filled 2019-02-16 (×3): qty 1
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
## 2019-02-17 DIAGNOSIS — N179 Acute kidney failure, unspecified: Secondary | ICD-10-CM | POA: Diagnosis not present

## 2019-02-17 MED ORDER — DM-GUAIFENESIN ER 30-600 MG PO TB12: 1.0000 | ORAL_TABLET | Freq: Two times a day (BID) | ORAL | Status: DC | PRN

## 2019-02-17 MED ORDER — OXYCODONE HCL 5 MG PO TABS: 5.0000 mg | ORAL_TABLET | ORAL | 0 refills | Status: AC | PRN

## 2019-02-17 NOTE — Care Management Obs Status (Signed)
[place] NOTIFICATION   Patient Details  Name: Diane [name] MRN: [id number] Date of Birth: 06-27-1941   Medicare Observation Status Notification Given:       Nila Nephew, LCSW 02/17/2019, 3:21 PM

## 2019-02-17 NOTE — Progress Notes (Signed)
Pt received bed offer at [facility] (pt's choice) and facility to begin insurance authorization request today. Pt aware she will be in copay days and states she is willing/able to pay. Facility and CSW investigating how many copay days she has left with Medicare to help plan.  Sharren Bridge, MSW, LCSW Clinical Social Work 02/17/2019 [phone]
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
## 2019-02-17 NOTE — Progress Notes (Signed)
Physical Therapy Treatment Patient Details Name: Diane [name] MRN: [id number] DOB: Apr 14, 1941 Today's Date: 02/17/2019    History of Present Illness Pt admitted with abnormal labs.  Pt is a 78 y.o. female with PMH of COPD (2L O2 home), CHF, A. fib on Eliquis, CAD, HTN, HLD, and lung CA s/p chemo, L ankle fx 11/29/18.   Pt at [facility] until recently and now at home with spouse and aides 8 hrs/day.    PT Comments    Pt in bed.  Applied R sneaker and L cam boot.  Assisted to side of bed.  General bed mobility comments: increased time with use of bed rails; assist to manage  LEs and use of bed pad to complete scooting to EOB Assisted to [facility]  General transfer comment: required increased assist this session with increased difficulty pushing self up to rise.  Assisted from elevated bed to [facility] then again off BSC + 2 assist.  Assisted with amb.  General Gait Details: 50% VC's on proper walker to self distance and 50% physical assist to advance walker straight and correct as she tended to push too far to front.  Positioned in recliner and instructed to call nursing staff when needed to use BCS during day.    Follow Up Recommendations  SNF;Home health PT(pending family ability)     Equipment Recommendations  None recommended by PT    Recommendations for Other Services       Precautions / Restrictions Precautions Precautions: Fall Required Braces or Orthoses: Other Brace Restrictions Weight Bearing Restrictions: No LLE Weight Bearing: Weight bearing as tolerated Other Position/Activity Restrictions: Pt states ltd WB allowed on LE but no physician order     Mobility  Bed Mobility Overal bed mobility: Needs Assistance Bed Mobility: Supine to Sit     Supine to sit: Mod assist     General bed mobility comments: increased time with use of bed rails; assist to manage  LEs and use of bed pad to complete scooting to EOB  Transfers Overall transfer level: Needs assistance Equipment used:  Rolling walker (2 wheeled) Transfers: Sit to/from [facility] Sit to Stand: Mod assist;+2 physical assistance;+2 safety/equipment Stand pivot transfers: Mod assist;+2 physical assistance;+2 safety/equipment       General transfer comment: required increased assist this session with increased difficulty pushing self up to rise.  Assisted from elevated bed to [facility] then again off BSC + 2 assist.    Ambulation/Gait Ambulation/Gait assistance: Min assist Gait Distance (Feet): 26 Feet Assistive device: Rolling walker (2 wheeled) Gait Pattern/deviations: Step-to pattern;Decreased step length - right;Decreased step length - left;Shuffle;Trunk flexed Gait velocity: decreased    General Gait Details: 50% VC's on proper walker to self distance and 50% physical assist to advance walker straight and correct as she tended to push too far to front.     Stairs             Wheelchair Mobility    Modified Rankin (Stroke Patients Only)       Balance                                            Cognition Arousal/Alertness: Awake/alert Behavior During Therapy: WFL for tasks assessed/performed Overall Cognitive Status: Within Functional Limits for tasks assessed  Exercises      General Comments        Pertinent Vitals/Pain Pain Assessment: Faces Faces Pain Scale: Hurts a little bit Pain Location: L ankle "achy" after session Pain Descriptors / Indicators: Aching Pain Intervention(s): Monitored during session;Repositioned    Home Living                      Prior Function            PT Goals (current goals can now be found in the care plan section) Progress towards PT goals: Progressing toward goals    Frequency    Min 3X/week      PT Plan Current plan remains appropriate    Co-evaluation              AM-PAC PT "6 Clicks" Mobility   Outcome Measure  Help  needed turning from your back to your side while in a flat bed without using bedrails?: A Lot Help needed moving from lying on your back to sitting on the side of a flat bed without using bedrails?: A Lot Help needed moving to and from a bed to a chair (including a wheelchair)?: A Lot Help needed standing up from a chair using your arms (e.g., wheelchair or bedside chair)?: A Lot Help needed to walk in hospital room?: A Lot Help needed climbing 3-5 steps with a railing? : Total 6 Click Score: 11    End of Session Equipment Utilized During Treatment: Gait belt Activity Tolerance: Patient tolerated treatment well Patient left: in chair;with call bell/phone within reach Nurse Communication: Mobility status PT Visit Diagnosis: Difficulty in walking, not elsewhere classified (R26.2);Muscle weakness (generalized) (M62.81)     Time: 1610-9604 PT Time Calculation (min) (ACUTE ONLY): 25 min  Charges:  $Gait Training: 8-22 mins $Therapeutic Activity: 8-22 mins                     Rica Koyanagi  PTA Acute  Rehabilitation Services Pager      [phone] Office      [phone]

## 2019-02-18 DIAGNOSIS — N179 Acute kidney failure, unspecified: Secondary | ICD-10-CM | POA: Diagnosis not present

## 2019-02-18 MED ORDER — HEPARIN SOD (PORK) LOCK FLUSH 100 UNIT/ML IV SOLN: 500.0000 [IU] | INTRAVENOUS | Status: DC

## 2019-02-18 MED ORDER — HEPARIN SOD (PORK) LOCK FLUSH 100 UNIT/ML IV SOLN
500.0000 [IU] | INTRAVENOUS | Status: AC | PRN
  Administered 2019-02-18: 500 [IU]
  Filled 2019-02-18: qty 5

## 2019-02-18 MED ORDER — HEPARIN SOD (PORK) LOCK FLUSH 100 UNIT/ML IV SOLN: 500.0000 [IU] | INTRAVENOUS | Status: DC | PRN

## 2019-02-18 NOTE — Discharge Summary (Signed)
Physician Discharge Summary  Diane [name] [facility] XBJ:[id number] DOB: [birth year] DOA: 02/13/2019  PCP: [name], MD  Admit date: 02/13/2019 Discharge date: 02/18/2019  Admitted From: home  Disposition:  SNF  Recommendations for Outpatient Follow-up:  1. Follow up with PCP in 1-2 weeks 2. Please obtain BMP/CBC in one week   Home Health:NA  Equipment/Devices:NA   Discharge Condition:stable   CODE STATUS:full code  Diet recommendation: heart health   Brief/Interim Summary: 78 year old female with history of CKD stage IV, baseline creatinine about 2 followed by [name], coronary artery disease, chronic diastolic heart failure, hypertension and chronic A. fib on Eliquis, lung cancer currently not on treatment, COPD on 2 L of home oxygen who was sent to the ER by her PCP with elevated creatinine, reportedly 3.  According to the patient, she has 1 or 2 loose bowel movement a day for the last 3 weeks.  Had used antibiotics about a month ago while she was at the SNF for pneumonia.  No fever or leukocytosis.  She has been on torsemide for her diastolic dysfunction.  She recently had a left ankle fracture, AKI during January where she was stabilized and sent to the skilled facility.  She is still wearing a walking boot on left leg.  Patient also reported difficulty living at home with   Discharge Diagnoses:  Principal Problem:   AKI (acute kidney injury) ([place]) Active Problems:   Chronic diastolic heart failure (HCC)   COPD (chronic obstructive pulmonary disease) (HCC)   General weakness   Diarrhea  Acute kidney injury on chronic kidney disease stage IV: Probably aggravated by ongoing diarrhea and continuous use of diuretics. Treated with IV fluids. Patient currently is euvolemic.Followed by [name] as outpatient.  Renal ultrasound with chronic kidney disease.  No obstruction. Creatinine 2.8- 2.6- 1.9. Will resume torsemide and lower dose.  20 mg/day.  She will follow-up  outpatient.  Diarrhea: Patient had symptoms of diarrhea when she came in.  No bowel movement and now she says she has constipation.  Laxatives as needed.   Deconditioning, generalized weakness: Probably due to ongoing medical problems, recent left ankle fracture.  She will work with PT OT.  May benefit with further inpatient therapies at SNF. She will go to skilled nursing facility.  Persistent atrial fibrillation: On amiodarone and Coreg.  She is sinus rhythm.  Eliquis dose adjusted back to 5 mg twice a day.    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  Chronic pain syndrome: Patient is on oxycodone that she will continue.  Patient is medically stable.     Discharge Instructions  Discharge Instructions    Diet - low sodium heart healthy   Complete by:  As directed    Increase activity slowly   Complete by:  As directed      Allergies as of 02/18/2019      Reactions   Septra Ds [sulfamethoxazole-trimethoprim] Nausea Only      Medication List    STOP taking these medications   hydrALAZINE 25 MG tablet Commonly known as:  APRESOLINE   Potassium Chloride ER 20 MEQ Tbcr     TAKE these medications   acetaminophen 500 MG tablet Commonly known as:  TYLENOL Take 1,000 mg by mouth at bedtime.   albuterol 108 (90 Base) MCG/ACT inhaler Commonly known as:  ProAir HFA Inhale 2 puffs into the lungs every 4 (four) hours as needed for wheezing or shortness of breath.   amiodarone 200  MG tablet Commonly known as:  PACERONE Take 1 tablet (200 mg total) by mouth daily.   Anoro Ellipta 62.5-25 MCG/INH Aepb Generic drug:  umeclidinium-vilanterol INHALE 1 PUFF BY MOUTH INTO LUNGS ONCE DAILY What changed:  See the new instructions.   apixaban 5 MG Tabs tablet Commonly known as:  Eliquis Take 1 tablet (5 mg total) by mouth 2 (two) times daily. OK to restart on Friday 05/10/18. What changed:  additional instructions   calcitRIOL 0.5 MCG  capsule Commonly known as:  ROCALTROL Take 0.5 mcg by mouth 3 (three) times daily.   carvedilol 12.5 MG tablet Commonly known as:  COREG Take 12.5 mg by mouth 2 (two) times daily with a meal. What changed:  Another medication with the same name was removed. Continue taking this medication, and follow the directions you see here.   docusate sodium 100 MG capsule Commonly known as:  COLACE Take 1 capsule (100 mg total) by mouth 2 (two) times daily.   ferrous sulfate 325 (65 FE) MG tablet Take 325 mg by mouth 3 (three) times daily with meals.   Melatonin 10 MG Tabs Take 10 mg by mouth daily.   oxyCODONE 5 MG immediate release tablet Commonly known as:  Oxy IR/ROXICODONE Take 1 tablet (5 mg total) by mouth every 4 (four) hours as needed for moderate pain.   OXYGEN Inhale 2 L into the lungs continuous.   ranitidine 150 MG tablet Commonly known as:  ZANTAC Take 150 mg by mouth.   torsemide 20 MG tablet Commonly known as:  DEMADEX Take 1 tablet (20 mg total) by mouth daily.   vitamin B-12 1000 MCG tablet Commonly known as:  CYANOCOBALAMIN Take 1,000 mcg by mouth daily.       Allergies  Allergen Reactions  . Septra Ds [Sulfamethoxazole-Trimethoprim] Nausea Only    Consultations:  None    Procedures/Studies: Dg Chest 2 View  Result Date: 02/13/2019 CLINICAL DATA:  Weakness EXAM: CHEST - 2 VIEW COMPARISON:  01/28/2019 FINDINGS: Left chest wall Port-A-Cath tip projects over the lower SVC, unchanged. There is a small right pleural effusion with right basilar atelectasis. Postsurgical findings of the right hemithorax. Left lung is clear. IMPRESSION: Unchanged chest radiograph postsurgical changes of the right lung and small pleural effusion. Electronically Signed   By: Ulyses Jarred M.D.   On: 02/13/2019 20:34   Dg Chest 2 View  Result Date: 01/28/2019 CLINICAL DATA:  Patient fell last night. Low back pain. History of congestive heart failure with soft tissue edema. EXAM:  CHEST - 2 VIEW COMPARISON:  Radiographs 12/01/2018.  CT 08/19/2018. FINDINGS: Left IJ Port-A-Cath extends to the mid SVC level in appears stable. The heart size and mediastinal contours are stable with aortic atherosclerosis. 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 The left lung is clear. There is no new airspace disease or pneumothorax. No acute osseous findings are seen. IMPRESSION: Stable chest with post treatment changes in the right hemithorax attributed to history of lung cancer. No acute findings. Electronically Signed   By: Richardean Sale M.D.   On: 01/28/2019 13:46   Dg Tibia/fibula Left  Result Date: 01/28/2019 CLINICAL DATA:  Patient fell last night with ankle pain and lower extremity edema. Ankle fracture with ORIF 12/06/2018. EXAM: LEFT TIBIA AND FIBULA - 2 VIEW COMPARISON:  Intraoperative radiographs 12/06/2018. FINDINGS: The hardware is intact status post distal fibular plate and screw and medial malleolar screw  fixation. No definite healing of the medial malleolar fracture is demonstrated. This fracture remains mildly displaced, but unchanged from the intraoperative views. There is no displacement of the distal fibular fracture. There is no new fracture or dislocation. There is diffuse lower leg soft tissue edema. IMPRESSION: Intact hardware and stable alignment of mildly displaced fracture of the medial malleolus. No interval healing identified. Nonspecific lower leg soft tissue edema. Electronically Signed   By: Richardean Sale M.D.   On: 01/28/2019 13:39   US Renal  Result Date: 02/14/2019 CLINICAL DATA:  Initial evaluation for acute renal injury. EXAM: RENAL / URINARY TRACT ULTRASOUND COMPLETE COMPARISON:  Prior ultrasound from 12/01/2018 FINDINGS: Right Kidney: Renal measurements: 6.6 x 3.9 x 3.4 cm = volume: 45.4 mL . Echogenicity within normal limits. Mild diffuse cortical thinning.  No mass or hydronephrosis visualized. No shadowing echogenic calculi. Left Kidney: Renal measurements: 9.5 x 4.3 x 3.9 cm = volume: 83.3 mL. Echogenicity within normal limits. Mild diffuse cortical thinning. No mass or hydronephrosis visualized. No shadowing echogenic calculi. Bladder: Appears normal for degree of bladder distention. Diffuse wall thickening felt to be related incomplete distension. IMPRESSION: 1. Small bilateral kidneys with diffuse cortical thinning, right greater than left, relatively stable in appearance from previous. 2. No hydronephrosis or other acute finding. Electronically Signed   By: Jeannine Boga M.D.   On: 02/14/2019 05:56   Dg Foot Complete Left  Result Date: 01/28/2019 CLINICAL DATA:  Patient fell last night with ankle pain and inability to get up. Lower leg edema. Ankle fracture with ORIF 12/06/2018. EXAM: LEFT FOOT - COMPLETE 3+ VIEW COMPARISON:  Intraoperative radiographs 12/06/2018. Additional radiographs 11/29/2018. FINDINGS: The bones are demineralized. No evidence of acute fracture or dislocation. There are stable postsurgical changes status post bimalleolar ORIF. The hardware is intact. There is generalized soft tissue swelling, especially in the dorsum of the midfoot and forefoot. No evidence of soft tissue emphysema. IMPRESSION: No acute osseous findings post ankle ORIF. Generalized soft tissue swelling. Electronically Signed   By: Richardean Sale M.D.   On: 01/28/2019 13:43      Subjective: Patient was seen and examined on the day of discharge.  She has no complaints.   Discharge Exam: Vitals:   02/18/19 0603 02/18/19 0858  BP: (!) 162/62 (!) 153/60  Pulse: 76 78  Resp: 18   Temp: 97.8 F (36.6 C)   SpO2: 100%    Vitals:   02/17/19 1336 02/17/19 2038 02/18/19 0603 02/18/19 0858  BP: (!) 132/52 (!) 127/48 (!) 162/62 (!) 153/60  Pulse: 74 76 76 78  Resp: 16 16 18    Temp: 97.8 F (36.6 C) 98 F (36.7 C) 97.8 F (36.6 C)   TempSrc: Oral Oral  Oral   SpO2: 100% 98% 100%   Weight:      Height:        General: Pt is alert, awake, not in acute distress Cardiovascular: RRR, S1/S2 +, no rubs, no gallops Respiratory: CTA bilaterally, no wheezing, no rhonchi, she has a port on her left chest. Abdominal: Soft, NT, ND, bowel sounds + Extremities: no edema, no cyanosis, trace bilateral pedal edema.    The results of significant diagnostics from this hospitalization (including imaging, microbiology, ancillary and laboratory) are listed below for reference.     Microbiology: No results found for this or any previous visit (from the past 240 hour(s)).   Labs: BNP (last 3 results) Recent Labs    01/28/19 1201  BNP 309.3*   Basic  Metabolic Panel: Recent Labs  Lab 02/13/19 1955 02/14/19 0553 02/15/19 0613 02/16/19 0646  NA 135 136 136 136  K 4.5 4.1 3.7 4.0  CL 96* 101 103 102  CO2 30 29 27 27   GLUCOSE 122* 96 91 94  BUN 33* 32* 29* 24*  CREATININE 2.86* 2.76* 2.26* 1.92*  CALCIUM 10.2 9.7 10.1 10.8*  MG 2.5*  --   --   --   PHOS 3.6  --   --   --    Liver Function Tests: Recent Labs  Lab 02/13/19 1955  AST 17  ALT 13  ALKPHOS 51  BILITOT 0.9  PROT 6.5  ALBUMIN 3.3*   No results for input(s): LIPASE, AMYLASE in the last 168 hours. No results for input(s): AMMONIA in the last 168 hours. CBC: Recent Labs  Lab 02/13/19 1955  WBC 5.2  NEUTROABS 3.2  HGB 10.2*  HCT 33.4*  MCV 105.0*  PLT 201   Cardiac Enzymes: No results for input(s): CKTOTAL, CKMB, CKMBINDEX, TROPONINI in the last 168 hours. BNP: Invalid input(s): POCBNP CBG: No results for input(s): GLUCAP in the last 168 hours. D-Dimer No results for input(s): DDIMER in the last 72 hours. Hgb A1c No results for input(s): HGBA1C in the last 72 hours. Lipid Profile No results for input(s): CHOL, HDL, LDLCALC, TRIG, CHOLHDL, LDLDIRECT in the last 72 hours. Thyroid function studies No results for input(s): TSH, T4TOTAL, T3FREE, THYROIDAB in the  last 72 hours.  Invalid input(s): FREET3 Anemia work up No results for input(s): VITAMINB12, FOLATE, FERRITIN, TIBC, IRON, RETICCTPCT in the last 72 hours. Urinalysis    Component Value Date/Time   COLORURINE YELLOW 02/14/2019 0901   APPEARANCEUR CLOUDY (A) 02/14/2019 0901   LABSPEC 1.010 02/14/2019 0901   PHURINE 5.0 02/14/2019 0901   GLUCOSEU NEGATIVE 02/14/2019 0901   HGBUR MODERATE (A) 02/14/2019 0901   BILIRUBINUR NEGATIVE 02/14/2019 0901   KETONESUR NEGATIVE 02/14/2019 0901   PROTEINUR NEGATIVE 02/14/2019 0901   UROBILINOGEN 1.0 12/05/2009 1303   NITRITE NEGATIVE 02/14/2019 0901   LEUKOCYTESUR LARGE (A) 02/14/2019 0901   Sepsis Labs Invalid input(s): PROCALCITONIN,  WBC,  LACTICIDVEN Microbiology No results found for this or any previous visit (from the past 240 hour(s)).   Time coordinating discharge:  25 minutes  SIGNED:   Barb Merino, MD  [facility] 02/18/2019, 1:58 PM Pager [phone]  If 7PM-7AM, please contact night-coverage [email or web address] Password [email or web address]

## 2019-02-18 NOTE — Progress Notes (Signed)
Physical Therapy Treatment Patient Details Name: Diane [name] MRN: [id number] DOB: 11-08-1941 Today's Date: 02/18/2019    History of Present Illness Pt admitted with abnormal labs.  Pt is a 78 y.o. female with PMH of COPD (2L O2 home), CHF, A. fib on Eliquis, CAD, HTN, HLD, and lung CA s/p chemo, L ankle fx 11/29/18.   Pt at [facility] until recently and now at home with spouse and aides 8 hrs/day.    PT Comments    Pt remeins on 2 lts nasal (same as Home O2).  Assisted OOB to [facility] required + 2 assist.  General bed mobility comments: increased time with use of bed rails; assist to manage  LEs and use of bed pad to complete scooting to EOB.  General transfer comment: assisted from elevated bed to [facility] + 2 side by side assist then off BSC to amb with 50% VC's on proper hand placement to push then hand transfer to walker once upright.  Then assisted with amb. General Gait Details: 50% VC's on proper walker to self distance and 50% physical assist to advance walker straight and correct as she tended to push too far to front.    Follow Up Recommendations  SNF(pt stated spouse is unable to assist so pt will need ST Rehab at [facility])     Equipment Recommendations  None recommended by PT    Recommendations for Other Services       Precautions / Restrictions Precautions Precautions: Fall Required Braces or Orthoses: Other Brace Restrictions Weight Bearing Restrictions: Yes LLE Weight Bearing: Weight bearing as tolerated Other Position/Activity Restrictions: Pt states ltd WB allowed on LE but no physician order     Mobility  Bed Mobility Overal bed mobility: Needs Assistance Bed Mobility: Supine to Sit     Supine to sit: Mod assist     General bed mobility comments: increased time with use of bed rails; assist to manage  LEs and use of bed pad to complete scooting to EOB  Transfers Overall transfer level: Needs assistance Equipment used: Rolling walker (2 wheeled) Transfers: Sit  to/from [facility] Sit to Stand: Mod assist;+2 physical assistance;+2 safety/equipment Stand pivot transfers: Mod assist;+2 physical assistance;+2 safety/equipment       General transfer comment: assisted from elevated bed to [facility] + 2 side by side assist then off BSC to amb with 50% VC's on proper hand placement to push then hand transfer to walker once upright.    Ambulation/Gait Ambulation/Gait assistance: Min assist;+2 safety/equipment Gait Distance (Feet): 28 Feet Assistive device: Rolling walker (2 wheeled) Gait Pattern/deviations: Step-to pattern;Decreased step length - right;Decreased step length - left;Shuffle;Trunk flexed Gait velocity: decreased    General Gait Details: 50% VC's on proper walker to self distance and 50% physical assist to advance walker straight and correct as she tended to push too far to front.     Stairs             Wheelchair Mobility    Modified Rankin (Stroke Patients Only)       Balance                                            Cognition Arousal/Alertness: Awake/alert Behavior During Therapy: WFL for tasks assessed/performed Overall Cognitive Status: Within Functional Limits for tasks assessed  Exercises      General Comments        Pertinent Vitals/Pain Pain Assessment: No/denies pain    Home Living                      Prior Function            PT Goals (current goals can now be found in the care plan section) Progress towards PT goals: Progressing toward goals    Frequency    Min 3X/week      PT Plan Current plan remains appropriate    Co-evaluation              AM-PAC PT "6 Clicks" Mobility   Outcome Measure  Help needed turning from your back to your side while in a flat bed without using bedrails?: A Lot Help needed moving from lying on your back to sitting on the side of a flat bed without using  bedrails?: A Lot Help needed moving to and from a bed to a chair (including a wheelchair)?: A Lot Help needed standing up from a chair using your arms (e.g., wheelchair or bedside chair)?: A Lot Help needed to walk in hospital room?: A Lot Help needed climbing 3-5 steps with a railing? : Total 6 Click Score: 11    End of Session Equipment Utilized During Treatment: Gait belt Activity Tolerance: Patient tolerated treatment well Patient left: in chair;with call bell/phone within reach Nurse Communication: Mobility status;Need for lift equipment(pt on Maxi Move pad if needed) PT Visit Diagnosis: Difficulty in walking, not elsewhere classified (R26.2);Muscle weakness (generalized) (M62.81)     Time: 7276-1848 PT Time Calculation (min) (ACUTE ONLY): 30 min  Charges:  $Gait Training: 8-22 mins $Therapeutic Activity: 8-22 mins                     Rica Koyanagi  PTA Acute  Rehabilitation Services Pager      [phone] Office      [phone]

## 2019-02-18 NOTE — TOC Transition Note (Signed)
Transition of Care [facility]) - CM/SW Discharge Note   Patient Details  Name: Diane [name] MRN: [id number] Date of Birth: [birth year]  Transition of Care [facility]) CM/SW Contact:  Nila Nephew, LCSW Phone Number: [phone] 02/18/2019, 1:45 PM   Clinical Narrative:    Pt admitting to [facility] SNF room 102-B Arranging ptar transportation. Pt states she is informing husband and family Pt received [facility] Medicare authorization to admit today.     Discharge Placement               [place]        Discharge Plan and Services                      [place]: (pt reports having [facility] currently)   Social Determinants of Health (SDOH) Interventions     Readmission Risk Interventions Readmission Risk Prevention Plan 02/14/2019  Transportation Screening Complete  Medication Review ([place]) Complete  PCP or Specialist appointment within 3-5 days of discharge Complete  HRI or Home Care Consult Complete  SW Recovery Care/Counseling Consult Complete  Palliative Care Screening Not [place] Complete  Some recent data might be hidden

## 2019-02-18 NOTE — Progress Notes (Signed)
Report called to [facility] center. Medical packet ready to go with patient. Port flushed with 10 ml normal saline and 500 units heparin. [facility] called to transport patient to [facility].
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
## 2019-02-20 ENCOUNTER — Telehealth: Payer: Self-pay | Admitting: Internal Medicine

## 2019-02-20 NOTE — Telephone Encounter (Signed)
Called patient per 3/25 VM scheduling log.  patient is in rehab...(they will try to call back and reschedule if the patient has gotten any better)   Cancelled appts for 04/17 and 04/20.

## 2019-03-14 ENCOUNTER — Other Ambulatory Visit: Payer: Medicare Other

## 2019-03-17 ENCOUNTER — Ambulatory Visit: Payer: Medicare Other | Admitting: Internal Medicine

## 2019-03-21 ENCOUNTER — Telehealth: Payer: Self-pay

## 2019-03-21 NOTE — Telephone Encounter (Signed)
Spoke with Dominica Severin to cancel office visit Per Dr. Ellyn Hack with Mrs. Kleier on 04/01/2019 due to covid 19. He informed me that the patient will be in rehab for 3 weeks and they will call to reschedule office visit once she is able to leave the rehab.

## 2019-03-24 ENCOUNTER — Other Ambulatory Visit: Payer: Self-pay

## 2019-03-24 ENCOUNTER — Inpatient Hospital Stay (HOSPITAL_COMMUNITY)
Admission: EM | Admit: 2019-03-24 | Discharge: 2019-03-30 | DRG: 377 | Disposition: A | Discharge status: Skilled Nursing Facility | Payer: Medicare Other | Attending: Student | Admitting: Student

## 2019-03-24 ENCOUNTER — Encounter (HOSPITAL_COMMUNITY): Payer: Self-pay | Admitting: Emergency Medicine

## 2019-03-24 DIAGNOSIS — N183 Chronic kidney disease, stage 3 unspecified: Secondary | ICD-10-CM | POA: Diagnosis present

## 2019-03-24 DIAGNOSIS — Z79899 Other long term (current) drug therapy: Secondary | ICD-10-CM

## 2019-03-24 DIAGNOSIS — K5900 Constipation, unspecified: Secondary | ICD-10-CM | POA: Diagnosis present

## 2019-03-24 DIAGNOSIS — Z923 Personal history of irradiation: Secondary | ICD-10-CM

## 2019-03-24 DIAGNOSIS — N184 Chronic kidney disease, stage 4 (severe): Secondary | ICD-10-CM | POA: Diagnosis present

## 2019-03-24 DIAGNOSIS — Z7951 Long term (current) use of inhaled steroids: Secondary | ICD-10-CM

## 2019-03-24 DIAGNOSIS — Z9841 Cataract extraction status, right eye: Secondary | ICD-10-CM

## 2019-03-24 DIAGNOSIS — J449 Chronic obstructive pulmonary disease, unspecified: Secondary | ICD-10-CM | POA: Diagnosis not present

## 2019-03-24 DIAGNOSIS — I700 Atherosclerosis of aorta: Secondary | ICD-10-CM | POA: Diagnosis present

## 2019-03-24 DIAGNOSIS — J189 Pneumonia, unspecified organism: Secondary | ICD-10-CM | POA: Diagnosis present

## 2019-03-24 DIAGNOSIS — D62 Acute posthemorrhagic anemia: Secondary | ICD-10-CM | POA: Diagnosis present

## 2019-03-24 DIAGNOSIS — K2990 Gastroduodenitis, unspecified, without bleeding: Secondary | ICD-10-CM | POA: Diagnosis present

## 2019-03-24 DIAGNOSIS — Z825 Family history of asthma and other chronic lower respiratory diseases: Secondary | ICD-10-CM

## 2019-03-24 DIAGNOSIS — J440 Chronic obstructive pulmonary disease with acute lower respiratory infection: Secondary | ICD-10-CM | POA: Diagnosis present

## 2019-03-24 DIAGNOSIS — Z882 Allergy status to sulfonamides status: Secondary | ICD-10-CM

## 2019-03-24 DIAGNOSIS — I130 Hypertensive heart and chronic kidney disease with heart failure and stage 1 through stage 4 chronic kidney disease, or unspecified chronic kidney disease: Secondary | ICD-10-CM | POA: Diagnosis present

## 2019-03-24 DIAGNOSIS — K319 Disease of stomach and duodenum, unspecified: Secondary | ICD-10-CM | POA: Diagnosis present

## 2019-03-24 DIAGNOSIS — I959 Hypotension, unspecified: Secondary | ICD-10-CM | POA: Diagnosis present

## 2019-03-24 DIAGNOSIS — I4510 Unspecified right bundle-branch block: Secondary | ICD-10-CM | POA: Diagnosis present

## 2019-03-24 DIAGNOSIS — Z8249 Family history of ischemic heart disease and other diseases of the circulatory system: Secondary | ICD-10-CM

## 2019-03-24 DIAGNOSIS — N179 Acute kidney failure, unspecified: Secondary | ICD-10-CM | POA: Diagnosis present

## 2019-03-24 DIAGNOSIS — Z85818 Personal history of malignant neoplasm of other sites of lip, oral cavity, and pharynx: Secondary | ICD-10-CM

## 2019-03-24 DIAGNOSIS — R609 Edema, unspecified: Secondary | ICD-10-CM

## 2019-03-24 DIAGNOSIS — T502X5A Adverse effect of carbonic-anhydrase inhibitors, benzothiadiazides and other diuretics, initial encounter: Secondary | ICD-10-CM | POA: Diagnosis not present

## 2019-03-24 DIAGNOSIS — E876 Hypokalemia: Secondary | ICD-10-CM | POA: Diagnosis not present

## 2019-03-24 DIAGNOSIS — I4819 Other persistent atrial fibrillation: Secondary | ICD-10-CM | POA: Diagnosis present

## 2019-03-24 DIAGNOSIS — Z6839 Body mass index (BMI) 39.0-39.9, adult: Secondary | ICD-10-CM | POA: Diagnosis not present

## 2019-03-24 DIAGNOSIS — C3491 Malignant neoplasm of unspecified part of right bronchus or lung: Secondary | ICD-10-CM | POA: Diagnosis present

## 2019-03-24 DIAGNOSIS — F419 Anxiety disorder, unspecified: Secondary | ICD-10-CM | POA: Diagnosis present

## 2019-03-24 DIAGNOSIS — Z87891 Personal history of nicotine dependence: Secondary | ICD-10-CM

## 2019-03-24 DIAGNOSIS — Z9842 Cataract extraction status, left eye: Secondary | ICD-10-CM

## 2019-03-24 DIAGNOSIS — I5032 Chronic diastolic (congestive) heart failure: Secondary | ICD-10-CM | POA: Diagnosis present

## 2019-03-24 DIAGNOSIS — Z20828 Contact with and (suspected) exposure to other viral communicable diseases: Secondary | ICD-10-CM | POA: Diagnosis present

## 2019-03-24 DIAGNOSIS — G894 Chronic pain syndrome: Secondary | ICD-10-CM | POA: Diagnosis present

## 2019-03-24 DIAGNOSIS — R0989 Other specified symptoms and signs involving the circulatory and respiratory systems: Secondary | ICD-10-CM | POA: Diagnosis not present

## 2019-03-24 DIAGNOSIS — K264 Chronic or unspecified duodenal ulcer with hemorrhage: Secondary | ICD-10-CM | POA: Diagnosis present

## 2019-03-24 DIAGNOSIS — K2971 Gastritis, unspecified, with bleeding: Principal | ICD-10-CM | POA: Diagnosis present

## 2019-03-24 DIAGNOSIS — Z7984 Long term (current) use of oral hypoglycemic drugs: Secondary | ICD-10-CM

## 2019-03-24 DIAGNOSIS — I2510 Atherosclerotic heart disease of native coronary artery without angina pectoris: Secondary | ICD-10-CM | POA: Diagnosis present

## 2019-03-24 DIAGNOSIS — Z9981 Dependence on supplemental oxygen: Secondary | ICD-10-CM

## 2019-03-24 DIAGNOSIS — Z7901 Long term (current) use of anticoagulants: Secondary | ICD-10-CM | POA: Diagnosis not present

## 2019-03-24 DIAGNOSIS — Z85118 Personal history of other malignant neoplasm of bronchus and lung: Secondary | ICD-10-CM

## 2019-03-24 DIAGNOSIS — K2970 Gastritis, unspecified, without bleeding: Secondary | ICD-10-CM | POA: Diagnosis present

## 2019-03-24 DIAGNOSIS — Z9221 Personal history of antineoplastic chemotherapy: Secondary | ICD-10-CM | POA: Diagnosis not present

## 2019-03-24 DIAGNOSIS — K210 Gastro-esophageal reflux disease with esophagitis: Secondary | ICD-10-CM | POA: Diagnosis present

## 2019-03-24 DIAGNOSIS — J439 Emphysema, unspecified: Secondary | ICD-10-CM | POA: Diagnosis not present

## 2019-03-24 DIAGNOSIS — Z79891 Long term (current) use of opiate analgesic: Secondary | ICD-10-CM

## 2019-03-24 DIAGNOSIS — K921 Melena: Secondary | ICD-10-CM | POA: Diagnosis present

## 2019-03-24 DIAGNOSIS — E8809 Other disorders of plasma-protein metabolism, not elsewhere classified: Secondary | ICD-10-CM | POA: Diagnosis present

## 2019-03-24 DIAGNOSIS — Z86010 Personal history of colonic polyps: Secondary | ICD-10-CM

## 2019-03-24 DIAGNOSIS — I341 Nonrheumatic mitral (valve) prolapse: Secondary | ICD-10-CM | POA: Diagnosis present

## 2019-03-24 DIAGNOSIS — Z803 Family history of malignant neoplasm of breast: Secondary | ICD-10-CM

## 2019-03-24 DIAGNOSIS — Z833 Family history of diabetes mellitus: Secondary | ICD-10-CM

## 2019-03-24 DIAGNOSIS — D631 Anemia in chronic kidney disease: Secondary | ICD-10-CM | POA: Diagnosis present

## 2019-03-24 DIAGNOSIS — Z961 Presence of intraocular lens: Secondary | ICD-10-CM | POA: Diagnosis present

## 2019-03-24 LAB — CBC WITH DIFFERENTIAL/PLATELET
Abs Immature Granulocytes: 0.07 10*3/uL (ref 0.00–0.07)
Basophils Absolute: 0 10*3/uL (ref 0.0–0.1)
Basophils Relative: 0 %
Eosinophils Absolute: 0.1 10*3/uL (ref 0.0–0.5)
Eosinophils Relative: 1 %
HCT: 15 % — ABNORMAL LOW (ref 36.0–46.0)
Hemoglobin: 4.7 g/dL — CL (ref 12.0–15.0)
Immature Granulocytes: 1 %
Lymphocytes Relative: 10 %
Lymphs Abs: 0.7 10*3/uL (ref 0.7–4.0)
MCH: 32.9 pg (ref 26.0–34.0)
MCHC: 31.3 g/dL (ref 30.0–36.0)
MCV: 104.9 fL — ABNORMAL HIGH (ref 80.0–100.0)
Monocytes Absolute: 0.6 10*3/uL (ref 0.1–1.0)
Monocytes Relative: 9 %
Neutro Abs: 5.3 10*3/uL (ref 1.7–7.7)
Neutrophils Relative %: 79 %
Platelets: 150 10*3/uL (ref 150–400)
RBC: 1.43 MIL/uL — ABNORMAL LOW (ref 3.87–5.11)
RDW: 15.4 % (ref 11.5–15.5)
WBC: 6.8 10*3/uL (ref 4.0–10.5)
nRBC: 0 % (ref 0.0–0.2)

## 2019-03-24 LAB — PREPARE RBC (CROSSMATCH)

## 2019-03-24 LAB — COMPREHENSIVE METABOLIC PANEL
ALT: 10 U/L (ref 0–44)
AST: 14 U/L — ABNORMAL LOW (ref 15–41)
Albumin: 2.4 g/dL — ABNORMAL LOW (ref 3.5–5.0)
Alkaline Phosphatase: 26 U/L — ABNORMAL LOW (ref 38–126)
Anion gap: 5 (ref 5–15)
BUN: 59 mg/dL — ABNORMAL HIGH (ref 8–23)
CO2: 32 mmol/L (ref 22–32)
Calcium: 10.9 mg/dL — ABNORMAL HIGH (ref 8.9–10.3)
Chloride: 100 mmol/L (ref 98–111)
Creatinine, Ser: 2.85 mg/dL — ABNORMAL HIGH (ref 0.44–1.00)
GFR calc Af Amer: 18 mL/min — ABNORMAL LOW (ref >60)
GFR calc non Af Amer: 15 mL/min — ABNORMAL LOW (ref >60)
Glucose, Bld: 119 mg/dL — ABNORMAL HIGH (ref 70–99)
Potassium: 2.9 mmol/L — ABNORMAL LOW (ref 3.5–5.1)
Sodium: 137 mmol/L (ref 135–145)
Total Bilirubin: 0.6 mg/dL (ref 0.3–1.2)
Total Protein: 4.9 g/dL — ABNORMAL LOW (ref 6.5–8.1)

## 2019-03-24 LAB — PROTIME-INR
INR: 1.9 — ABNORMAL HIGH (ref 0.8–1.2)
Prothrombin Time: 21.8 seconds — ABNORMAL HIGH (ref 11.4–15.2)

## 2019-03-24 MED ORDER — ACETAMINOPHEN 650 MG RE SUPP: 650.0000 mg | Freq: Four times a day (QID) | RECTAL | Status: DC | PRN

## 2019-03-24 MED ORDER — SODIUM CHLORIDE 0.9 % IV SOLN
8.0000 mg/h | INTRAVENOUS | Status: DC
  Administered 2019-03-25 – 2019-03-26 (×4): 8 mg/h via INTRAVENOUS
  Filled 2019-03-24 (×6): qty 80

## 2019-03-24 MED ORDER — ONDANSETRON HCL 4 MG PO TABS: 4.0000 mg | ORAL_TABLET | Freq: Four times a day (QID) | ORAL | Status: DC | PRN

## 2019-03-24 MED ORDER — UMECLIDINIUM-VILANTEROL 62.5-25 MCG/INH IN AEPB
1.0000 | INHALATION_SPRAY | Freq: Every day | RESPIRATORY_TRACT | Status: DC
  Administered 2019-03-26 – 2019-03-30 (×5): 1 via RESPIRATORY_TRACT
  Filled 2019-03-24 (×2): qty 14

## 2019-03-24 MED ORDER — AMIODARONE HCL 200 MG PO TABS
200.0000 mg | ORAL_TABLET | Freq: Every day | ORAL | Status: DC
  Administered 2019-03-25 – 2019-03-30 (×6): 200 mg via ORAL
  Filled 2019-03-24 (×6): qty 1

## 2019-03-24 MED ORDER — SODIUM CHLORIDE 0.9 % IV SOLN
80.0000 mg | Freq: Once | INTRAVENOUS | Status: AC
  Administered 2019-03-25: 80 mg via INTRAVENOUS
  Filled 2019-03-24 (×2): qty 80

## 2019-03-24 MED ORDER — ALBUTEROL SULFATE (2.5 MG/3ML) 0.083% IN NEBU: 3.0000 mL | INHALATION_SOLUTION | RESPIRATORY_TRACT | Status: DC | PRN

## 2019-03-24 MED ORDER — PANTOPRAZOLE SODIUM 40 MG IV SOLR: 40.0000 mg | Freq: Two times a day (BID) | INTRAVENOUS | Status: DC

## 2019-03-24 MED ORDER — SODIUM CHLORIDE 0.9% FLUSH
3.0000 mL | Freq: Once | INTRAVENOUS | Status: AC
  Administered 2019-03-25: 3 mL via INTRAVENOUS

## 2019-03-24 MED ORDER — SODIUM CHLORIDE 0.9% IV SOLUTION
Freq: Once | INTRAVENOUS | Status: AC
  Administered 2019-03-25: 02:00:00 via INTRAVENOUS

## 2019-03-24 MED ORDER — SODIUM CHLORIDE 0.9% IV SOLUTION
Freq: Once | INTRAVENOUS | Status: AC
  Administered 2019-03-25: 06:00:00 via INTRAVENOUS

## 2019-03-24 MED ORDER — ACETAMINOPHEN 325 MG PO TABS: 650.0000 mg | ORAL_TABLET | Freq: Four times a day (QID) | ORAL | Status: DC | PRN

## 2019-03-24 MED ORDER — CARVEDILOL 12.5 MG PO TABS: 12.5000 mg | ORAL_TABLET | Freq: Two times a day (BID) | ORAL | Status: DC

## 2019-03-24 MED ORDER — ONDANSETRON HCL 4 MG/2ML IJ SOLN
4.0000 mg | Freq: Four times a day (QID) | INTRAMUSCULAR | Status: DC | PRN
  Filled 2019-03-24: qty 2

## 2019-03-24 NOTE — H&P (Signed)
History and Physical    [name] EHU:[id number] DOB: [birth year] DOA: 03/24/2019  PCP: [name], MD  Patient coming from: Home  I have personally briefly reviewed patient's old medical records in [place]  Chief Complaint: Abnormal labs, weakness  HPI: Diane [name] is a 78 y.o. female with medical history significant of NSCLC currently in remission, chronic diastolic CHF, HTN, A.Fib on Eliquis.  Patient presents to the ED after lab work testing today revealed low HGB.  She has had melena for "just a few days".  Sent in to ED for evaluation.   ED Course: Patient with melanotic BM here in ED.  HGB 4.7 down from 10.x back in March.  1u PRBC transfusion ordered and hospitalist asked to admit.   Review of Systems: As per HPI otherwise 10 point review of systems negative.   Past Medical History:  Diagnosis Date  . Anemia   . Aortic atherosclerosis ([place]) 12/03/2017   Noted on CT scan  . CAD (coronary artery disease), native coronary artery 12/03/2017   Noted on CT scan by calcification   . CHF (congestive heart failure) ([place])   . Chronic diastolic heart failure ([place]) 12/03/2017  . Dyspnea   . GERD (gastroesophageal reflux disease)   . Heart murmur   . History of blood transfusion    "related to chemo" (12/05/2017)  . Hypertension   . Hypertensive heart disease 12/03/2017  . Kidney disease, chronic, stage III (GFR 30-59 ml/min) ([place]) 12/03/2017  . Lung cancer ([place]) dx'd 02/2010  . Mitral valve prolapse   . Morbid obesity ([place]) 12/03/2017  . On home oxygen therapy   . 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 Paroxysmal atrial fibrillation (HCC)   . Persistent atrial fibrillation 12/03/2017  . Restrictive lung disease 05/23/2017    Past Surgical History:  Procedure Laterality Date  . BIOPSY  07/16/2018   Procedure: BIOPSY;  Surgeon: Mauri Pole, MD;  Location: [facility];  Service: Endoscopy;;  . CARDIOVERSION N/A 12/06/2017   Procedure:  CARDIOVERSION;  Surgeon: Jacolyn Reedy, MD;  Location: [facility] ENDOSCOPY;  Service: Cardiovascular;  Laterality: N/A;  . CATARACT EXTRACTION W/ INTRAOCULAR LENS  IMPLANT, BILATERAL Bilateral   . COLONOSCOPY WITH PROPOFOL N/A 07/16/2018   Procedure: COLONOSCOPY WITH PROPOFOL;  Surgeon: Mauri Pole, MD;  Location: [facility] ENDOSCOPY;  Service: Endoscopy;  Laterality: N/A;  . DERMABRASION  ~ 1966   "face; for acne"  . ESOPHAGOGASTRODUODENOSCOPY (EGD) WITH PROPOFOL N/A 07/16/2018   Procedure: ESOPHAGOGASTRODUODENOSCOPY (EGD) WITH PROPOFOL;  Surgeon: Mauri Pole, MD;  Location: [facility] ENDOSCOPY;  Service: Endoscopy;  Laterality: N/A;  . ORIF ANKLE FRACTURE Left 12/06/2018   Procedure: OPEN REDUCTION INTERNAL FIXATION (ORIF) ANKLE FRACTURE;  Surgeon: Nicholes Stairs, MD;  Location: [place];  Service: Orthopedics;  Laterality: Left;  . PAROTID GLAND TUMOR EXCISION Left   . POLYPECTOMY  07/16/2018   Procedure: POLYPECTOMY;  Surgeon: Mauri Pole, MD;  Location: [facility] ENDOSCOPY;  Service: Endoscopy;;  . PORTACATH PLACEMENT  11/03/08-[place]   tip in mid SVC-[place]. [place]  . THORACENTESIS Right   . TONSILLECTOMY    . VIDEO BRONCHOSCOPY WITH ENDOBRONCHIAL ULTRASOUND Right 05/08/2018   Procedure: VIDEO BRONCHOSCOPY WITH ENDOBRONCHIAL ULTRASOUND;  Surgeon: Collene Gobble, MD;  Location: [place];  Service: Thoracic;  Laterality: Right;     reports that she quit smoking about 17 years ago. Her smoking use included cigarettes. She has a 80.00 pack-year smoking history. She has never used smokeless  tobacco. She reports current alcohol use. She reports that she does not use drugs.  Allergies  Allergen Reactions  . Septra Ds [Sulfamethoxazole-Trimethoprim] Nausea Only    Family History  Problem Relation Age of Onset  . Breast cancer Mother   . Heart attack Mother   . Diabetes Father   . Heart attack Father   . Diabetes Brother   . COPD Cousin   . Colon cancer Neg Hx      Prior to Admission  medications   Medication Sig Start Date End Date Taking? Authorizing Provider  acetaminophen (TYLENOL) 500 MG tablet Take 1,000 mg by mouth at bedtime.    [provider]  albuterol (PROAIR HFA) 108 (90 Base) MCG/ACT inhaler Inhale 2 puffs into the lungs every 4 (four) hours as needed for wheezing or shortness of breath. 07/08/18   Collene Gobble, MD  amiodarone (PACERONE) 200 MG tablet Take 1 tablet (200 mg total) by mouth daily. 10/02/18   Leonie Man, MD  ANORO ELLIPTA 62.5-25 MCG/INH AEPB INHALE 1 PUFF BY MOUTH INTO LUNGS ONCE DAILY Patient taking differently: Inhale 1 puff into the lungs daily.  09/19/18   Collene Gobble, MD  apixaban (ELIQUIS) 5 MG TABS tablet Take 1 tablet (5 mg total) by mouth 2 (two) times daily. OK to restart on Friday 05/10/18. Patient taking differently: Take 5 mg by mouth 2 (two) times daily.  10/02/18   Leonie Man, MD  calcitRIOL (ROCALTROL) 0.5 MCG capsule Take 0.5 mcg by mouth 3 (three) times daily.  09/27/18   [provider]  carvedilol (COREG) 12.5 MG tablet Take 12.5 mg by mouth 2 (two) times daily with a meal.    [provider]  docusate sodium (COLACE) 100 MG capsule Take 1 capsule (100 mg total) by mouth 2 (two) times daily. Patient not taking: Reported on 01/28/2019 12/11/18   Cristal Ford, DO  ferrous sulfate 325 (65 FE) MG tablet Take 325 mg by mouth 3 (three) times daily with meals.     [provider]  Melatonin 10 MG TABS Take 10 mg by mouth daily.    [provider]  oxyCODONE (OXY IR/ROXICODONE) 5 MG immediate release tablet Take 1 tablet (5 mg total) by mouth every 4 (four) hours as needed for moderate pain. 02/17/19   Barb Merino, MD  OXYGEN Inhale 2 L into the lungs continuous.    [provider]  ranitidine (ZANTAC) 150 MG tablet Take 150 mg by mouth.  01/23/19   [provider]  torsemide (DEMADEX) 20 MG tablet Take 1 tablet (20 mg total) by mouth daily. 02/18/19   Barb Merino, MD  vitamin B-12 (CYANOCOBALAMIN) 1000 MCG tablet Take 1,000 mcg by mouth daily.    [provider]    Physical Exam: Vitals:   03/24/19 2148 03/24/19 2200 03/24/19 2215 03/24/19 2230  BP: (!) 153/66 (!) 128/54 (!) 99/52 (!) 114/47  Pulse: 93 87 80 77  Resp: (!) 25 (!) 26 (!) 21 (!) 22  Temp: 98.8 F (37.1 C)     TempSrc: Rectal     SpO2: 100% 100% 100% 100%    Constitutional: NAD, calm, comfortable, very Pale complexion Eyes: PERRL, lids and conjunctivae normal ENMT: Mucous membranes are moist. Posterior pharynx clear of any exudate or lesions.Normal dentition.  Neck: normal, supple, no masses, no thyromegaly Respiratory: clear to auscultation bilaterally, no wheezing, no crackles. Normal respiratory effort. No accessory muscle use.  Cardiovascular: Regular rate and rhythm, no  murmurs / rubs / gallops. No extremity edema. 2+ pedal pulses. No carotid bruits.  Abdomen: no tenderness, no masses palpated. No hepatosplenomegaly. Bowel sounds positive.  Musculoskeletal: no clubbing / cyanosis. No joint deformity upper and lower extremities. Good ROM, no contractures. Normal muscle tone.  Skin: no rashes, lesions, ulcers. No induration Neurologic: CN 2-12 grossly intact. Sensation intact, DTR normal. Strength 5/5 in all 4.  Psychiatric: Normal judgment and insight. Alert and oriented x 3. Normal mood.    Labs on Admission: I have personally reviewed following labs and imaging studies  CBC: Recent Labs  Lab 03/24/19 2151  WBC 6.8  NEUTROABS 5.3  HGB 4.7*  HCT 15.0*  MCV 104.9*  PLT 026   Basic Metabolic Panel: Recent Labs  Lab 03/24/19 2151  NA 137  K 2.9*  CL 100  CO2 32  GLUCOSE 119*  BUN 59*  CREATININE 2.85*  CALCIUM 10.9*   GFR: CrCl cannot be calculated (Unknown ideal weight.). Liver Function Tests: Recent Labs  Lab 03/24/19 2151  AST 14*  ALT 10  ALKPHOS 26*  BILITOT 0.6  PROT 4.9*  ALBUMIN 2.4*   No results for input(s): LIPASE,  AMYLASE in the last 168 hours. No results for input(s): AMMONIA in the last 168 hours. Coagulation Profile: Recent Labs  Lab 03/24/19 2150  INR 1.9*   Cardiac Enzymes: No results for input(s): CKTOTAL, CKMB, CKMBINDEX, TROPONINI in the last 168 hours. BNP (last 3 results) No results for input(s): PROBNP in the last 8760 hours. HbA1C: No results for input(s): HGBA1C in the last 72 hours. CBG: No results for input(s): GLUCAP in the last 168 hours. Lipid Profile: No results for input(s): CHOL, HDL, LDLCALC, TRIG, CHOLHDL, LDLDIRECT in the last 72 hours. Thyroid Function Tests: No results for input(s): TSH, T4TOTAL, FREET4, T3FREE, THYROIDAB in the last 72 hours. Anemia Panel: No results for input(s): VITAMINB12, FOLATE, FERRITIN, TIBC, IRON, RETICCTPCT in the last 72 hours. Urine analysis:    Component Value Date/Time   COLORURINE YELLOW 02/14/2019 0901   APPEARANCEUR CLOUDY (A) 02/14/2019 0901   LABSPEC 1.010 02/14/2019 0901   PHURINE 5.0 02/14/2019 0901   GLUCOSEU NEGATIVE 02/14/2019 0901   HGBUR MODERATE (A) 02/14/2019 0901   BILIRUBINUR NEGATIVE 02/14/2019 0901   KETONESUR NEGATIVE 02/14/2019 0901   PROTEINUR NEGATIVE 02/14/2019 0901   UROBILINOGEN 1.0 12/05/2009 1303   NITRITE NEGATIVE 02/14/2019 0901   LEUKOCYTESUR LARGE (A) 02/14/2019 0901    Radiological Exams on Admission: No results found.  EKG: Independently reviewed.  Assessment/Plan Principal Problem:   Melena Active Problems:   Chronic diastolic heart failure (HCC)   Persistent atrial fibrillation; CHA2DS2Vacs = 6; Eliquis; Amiodarone for rhythm control   Kidney disease, chronic, stage III (GFR 30-59 ml/min) (HCC)   Hypertensive heart and chronic kidney disease with heart failure and stage 1 through stage 4 chronic kidney disease, or chronic kidney disease (HCC)   COPD (chronic obstructive pulmonary disease) ([place])   AKI (acute kidney injury) ([place])   Acute blood loss anemia    1. GIB with melena and  acute blood loss anemia - 1. Transfuse 3u PRBC total 2. Repeat CBC in AM 3. NPO after MN 4. protonix bolus and infusion 5. Tele monitor 6. Call GI in AM for likely EGD 2. PAF - 1. Hold eliquis 2. Continue amiodarone and coreg 3. CHF - 1. Cont coreg 2. hold demadex for the moment given AKI though this may need to be resumed in the AM after PRBC transfusions 4. AKI  on CKD stage 3 - presumably due to hemorrhage 1. Holding demadex for the moment 2. Transfusing 3. Repeat BMP in AM 4. Calcium running slightly elevated at 10.9, will hold calcitriol for the moment 5. COPD - 1. Cont home nebs  DVT prophylaxis: SCDs Code Status: Full Family Communication: No family in room Disposition Plan: Home after admit Consults called: None, Call GI in AM Admission status: Admit to inpatient  Severity of Illness: The appropriate patient status for this patient is INPATIENT. Inpatient status is judged to be reasonable and necessary in order to provide the required intensity of service to ensure the patient's safety. The patient's presenting symptoms, physical exam findings, and initial radiographic and laboratory data in the context of their chronic comorbidities is felt to place them at high risk for further clinical deterioration. Furthermore, it is not anticipated that the patient will be medically stable for discharge from the hospital within 2 midnights of admission. The following factors support the patient status of inpatient.   IP status for GIB with melena, HGB 4.7 requiring multiple units of blood transfusion.  * I certify that at the point of admission it is my clinical judgment that the patient will require inpatient hospital care spanning beyond 2 midnights from the point of admission due to high intensity of service, high risk for further deterioration and high frequency of surveillance required.*    GARDNER, JARED M. DO [facility]  How to contact the [facility] Attending or  Consulting provider [place] or covering provider during after hours [place], for this patient?  1. Check the care team in [facility] and look for a) attending/consulting [facility] provider listed and b) the [facility] team listed 2. Log into [email or web address]  [employer] Physician Scheduling and messaging for groups and whole hospitals  On call and physician scheduling software for group practices, residents, hospitalists and other medical providers for call, clinic, rotation and shift schedules. On[employer] is a hospital-wide system for scheduling doctors and paging doctors on call. EasyPlot is for scientific plotting and data analysis.  [email or web address]  and use [place]'s universal password to access. If you do not have the password, please contact the hospital operator.  3. Locate the [facility] provider you are looking for under [facility] and page to a number that you can be directly reached. 4. If you still have difficulty reaching the provider, please page the [facility] (Director on Call) for the Hospitalists listed on amion for assistance.  03/24/2019, 11:40 PM

## 2019-03-24 NOTE — ED Triage Notes (Signed)
Pt EMS, pt from [facility], called for low hemoglobin, weakness and dizziness.  C/o N/V earlier in the day.  On 2L O2 normally.  Currently treated for UTI, no pain except left toe pain.    Dark tarry stools. CBG -147 O2 - 100% on 2L BP - 136/55

## 2019-03-24 NOTE — ED Provider Notes (Signed)
[facility] EMERGENCY DEPARTMENT Provider Note   CSN: [id number] Arrival date & time: 03/24/19  2140    History   Chief Complaint Chief Complaint  Patient presents with  . abnormal labs  . Weakness    HPI Diane [name] is a 78 y.o. female.     HPI 78 year old woman with past medical history of anemia presents to the emergency department for evaluation of low hemoglobin found at outside lab work testing today.  Patient found to have a black tarry bowel movement on arrival to the emergency department.  No history of rectal bleeding that the patient is aware of though she does have a history of hemorrhoids she states.  Patient takes Eliquis for atrial fibrillation.  Does not have any chest pain or shortness of breath but is complaining of more fatigue than usual today.  Appetite is been normal.  Denies any abdominal pain at this time though she is currently being treated as an outpatient for urinary tract infection. Past Medical History:  Diagnosis Date  . Anemia   . Aortic atherosclerosis ([place]) 12/03/2017   Noted on CT scan  . CAD (coronary artery disease), native coronary artery 12/03/2017   Noted on CT scan by calcification   . CHF (congestive heart failure) ([place])   . Chronic diastolic heart failure ([place]) 12/03/2017  . Dyspnea   . GERD (gastroesophageal reflux disease)   . Heart murmur   . History of blood transfusion    "related to chemo" (12/05/2017)  . Hypertension   . Hypertensive heart disease 12/03/2017  . Kidney disease, chronic, stage III (GFR 30-59 ml/min) ([place]) 12/03/2017  . Lung cancer ([place]) dx'd 02/2010  . Mitral valve prolapse   . Morbid obesity ([employer]) 12/03/2017  . On home oxygen therapy   . 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 Paroxysmal atrial fibrillation (HCC)   . Persistent atrial fibrillation 12/03/2017  . Restrictive lung disease 05/23/2017    Patient Active Problem List   Diagnosis Date Noted  . General weakness 02/14/2019  .  Diarrhea 02/14/2019  . AKI (acute kidney injury) ([place]) 02/13/2019  . Fall at home, initial encounter 11/30/2018  . Trimalleolar fracture of ankle, closed, left, initial encounter 11/29/2018  . Polyp of cecum   . Polyp of ascending colon   . Polyp of transverse colon   . Polyp of descending colon   . Polyp of rectum   . Heme positive stool 06/11/2018  . Oxygen dependent 06/11/2018  . Status post bronchoscopy with biopsy   . Anemia 04/25/2018  . Chronic respiratory failure with hypoxia ([place]) 01/17/2018  . Hypotension 12/13/2017  . COPD (chronic obstructive pulmonary disease) ([place]) 12/11/2017  . Chronic diastolic heart failure ([place]) 12/03/2017  . Persistent atrial fibrillation; CHA2DS2Vacs = 6; Eliquis; Amiodarone for rhythm control 12/03/2017  . Chronic anticoagulation 12/03/2017  . Morbid obesity ([place]) 12/03/2017  . Kidney disease, chronic, stage III (GFR 30-59 ml/min) (HCC) 12/03/2017  . Hypertensive heart and chronic kidney disease with heart failure and stage 1 through stage 4 chronic kidney disease, or chronic kidney disease ([place]) 12/03/2017  . Coronary artery disease involving native coronary artery of native heart without angina pectoris 12/03/2017  . Aortic atherosclerosis ([place]) 12/03/2017  . Restrictive lung disease 05/23/2017  . Port catheter in place 08-17-202017  . Cancer of right lung parenchyma ([place]) 10/17/2011    Past Surgical History:  Procedure Laterality Date  . BIOPSY  07/16/2018   Procedure: BIOPSY;  Surgeon: Mauri Pole,  MD;  Location: [facility];  Service: Endoscopy;;  . CARDIOVERSION N/A 12/06/2017   Procedure: CARDIOVERSION;  Surgeon: Jacolyn Reedy, MD;  Location: [facility] ENDOSCOPY;  Service: Cardiovascular;  Laterality: N/A;  . CATARACT EXTRACTION W/ INTRAOCULAR LENS  IMPLANT, BILATERAL Bilateral   . COLONOSCOPY WITH PROPOFOL N/A 07/16/2018   Procedure: COLONOSCOPY WITH PROPOFOL;  Surgeon: Mauri Pole, MD;  [facility];  Service:  Endoscopy;  Laterality: N/A;  . DERMABRASION  ~ 1966   "face; for acne"  . ESOPHAGOGASTRODUODENOSCOPY (EGD) WITH PROPOFOL N/A 07/16/2018   Procedure: ESOPHAGOGASTRODUODENOSCOPY (EGD) WITH PROPOFOL;  Surgeon: Mauri Pole, MD;  [facility];  Service: Endoscopy;  Laterality: N/A;  . ORIF ANKLE FRACTURE Left 12/06/2018   Procedure: OPEN REDUCTION INTERNAL FIXATION (ORIF) ANKLE FRACTURE;  Surgeon: Nicholes Stairs, MD;  Location: [facility];  Service: Orthopedics;  Laterality: Left;  . PAROTID GLAND TUMOR EXCISION Left   . POLYPECTOMY  07/16/2018   Procedure: POLYPECTOMY;  Surgeon: Mauri Pole, MD;  [facility];  Service: Endoscopy;;  . PORTACATH PLACEMENT  11/03/08-[place]   tip in mid SVC-E. Mansell  . THORACENTESIS Right   . TONSILLECTOMY    . VIDEO BRONCHOSCOPY WITH ENDOBRONCHIAL ULTRASOUND Right 05/08/2018   Procedure: VIDEO BRONCHOSCOPY WITH ENDOBRONCHIAL ULTRASOUND;  Surgeon: Collene Gobble, MD;  Location[facility];  Service: Thoracic;  Laterality: Right;     OB History   No obstetric history on file.      Home Medications    Prior to Admission medications   Medication Sig Start Date End Date Taking? Authorizing Provider  acetaminophen (TYLENOL) 500 MG tablet Take 1,000 mg by mouth at bedtime.    [provider]  albuterol (PROAIR HFA) 108 (90 Base) MCG/ACT inhaler Inhale 2 puffs into the lungs every 4 (four) hours as needed for wheezing or shortness of breath. 07/08/18   Collene Gobble, MD  amiodarone (PACERONE) 200 MG tablet Take 1 tablet (200 mg total) by mouth daily. 10/02/18   Leonie Man, MD  ANORO ELLIPTA 62.5-25 MCG/INH AEPB INHALE 1 PUFF BY MOUTH INTO LUNGS ONCE DAILY Patient taking differently: Inhale 1 puff into the lungs daily.  09/19/18   Collene Gobble, MD  apixaban (ELIQUIS) 5 MG TABS tablet Take 1 tablet (5 mg total) by mouth 2 (two) times daily. OK to restart on Friday 05/10/18. Patient taking differently: Take 5 mg by  mouth 2 (two) times daily.  10/02/18   Leonie Man, MD  calcitRIOL (ROCALTROL) 0.5 MCG capsule Take 0.5 mcg by mouth 3 (three) times daily.  09/27/18   [provider]  carvedilol (COREG) 12.5 MG tablet Take 12.5 mg by mouth 2 (two) times daily with a meal.    [provider]  docusate sodium (COLACE) 100 MG capsule Take 1 capsule (100 mg total) by mouth 2 (two) times daily. Patient not taking: Reported on 01/28/2019 12/11/18   Cristal Ford, DO  ferrous sulfate 325 (65 FE) MG tablet Take 325 mg by mouth 3 (three) times daily with meals.     [provider]  Melatonin 10 MG TABS Take 10 mg by mouth daily.    [provider]  oxyCODONE (OXY IR/ROXICODONE) 5 MG immediate release tablet Take 1 tablet (5 mg total) by mouth every 4 (four) hours as needed for moderate pain. 02/17/19   Barb Merino, MD  OXYGEN Inhale 2 L into the lungs continuous.    [provider]  ranitidine (ZANTAC) 150 MG  tablet Take 150 mg by mouth.  01/23/19   [provider]  torsemide (DEMADEX) 20 MG tablet Take 1 tablet (20 mg total) by mouth daily. 02/18/19   Barb Merino, MD  vitamin B-12 (CYANOCOBALAMIN) 1000 MCG tablet Take 1,000 mcg by mouth daily.    [provider]    Family History Family History  Problem Relation Age of Onset  . Breast cancer Mother   . Heart attack Mother   . Diabetes Father   . Heart attack Father   . Diabetes Brother   . COPD Cousin   . Colon cancer Neg Hx     Social History Social History   Tobacco Use  . Smoking status: Former Smoker    Packs/day: 2.00    Years: 40.00    Pack years: 80.00    Types: Cigarettes    Last attempt to quit: 11/27/2001    Years since quitting: 17.3  . Smokeless tobacco: Never Used  Substance Use Topics  . Alcohol use: Yes    Comment: 12/05/2017 "once in a great while I might have a drink"  . Drug use: No     Allergies   Septra ds [sulfamethoxazole-trimethoprim]   Review of Systems  Review of Systems  Constitutional: Positive for activity change and fatigue. Negative for appetite change, chills and fever.  HENT: Negative for ear pain and sore throat.   Eyes: Negative for pain and visual disturbance.  Respiratory: Negative for cough and shortness of breath.   Cardiovascular: Negative for chest pain and palpitations.  Gastrointestinal: Negative for abdominal pain and vomiting.  Endocrine: Negative for polyphagia and polyuria.  Genitourinary: Negative for dysuria and hematuria.  Musculoskeletal: Negative for arthralgias and back pain.  Skin: Negative for color change and rash.  Allergic/Immunologic: Negative for immunocompromised state.  Neurological: Negative for seizures and syncope.  All other systems reviewed and are negative.    Physical Exam Updated Vital Signs There were no vitals taken for this visit.  Physical Exam Vitals signs and nursing note reviewed.  Constitutional:      Appearance: She is well-developed. She is obese. She is not ill-appearing or diaphoretic.  HENT:     Head: Normocephalic and atraumatic.     Nose: No congestion or rhinorrhea.     Mouth/Throat:     Pharynx: No oropharyngeal exudate or posterior oropharyngeal erythema.  Eyes:     Conjunctiva/sclera: Conjunctivae normal.  Neck:     Musculoskeletal: Neck supple.  Cardiovascular:     Rate and Rhythm: Normal rate and regular rhythm.     Heart sounds: No murmur.  Pulmonary:     Effort: Pulmonary effort is normal. No respiratory distress.     Breath sounds: Normal breath sounds.  Abdominal:     Palpations: Abdomen is soft.     Tenderness: There is no abdominal tenderness.  Genitourinary:    Comments: Tarry stool present on evaluation  Musculoskeletal:     Right lower leg: Edema present.     Left lower leg: Edema present.  Skin:    General: Skin is warm and dry.     Capillary Refill: Capillary refill takes less than 2 seconds.  Neurological:     General: No focal deficit  present.     Mental Status: She is alert and oriented to person, place, and time. Mental status is at baseline.     Sensory: No sensory deficit.     Motor: No weakness.  Psychiatric:        Mood  and Affect: Mood normal.      ED Treatments / Results  Labs (all labs ordered are listed, but only abnormal results are displayed) Labs Reviewed - No data to display  EKG EKG Interpretation  Date/Time:  Monday March 24 2019 21:43:35 EDT Ventricular Rate:  95 PR Interval:    QRS Duration: 196 QT Interval:  439 QTC Calculation: 552 R Axis:   -70 Text Interpretation:  Atrial flutter with predominant 3:1 AV block Right bundle branch block ST-t wave abnormality Abnormal ekg Confirmed by Carmin Muskrat [id number]) on 03/24/2019 9:47:57 PM   Radiology No results found.  Procedures Procedures (including critical care time)  Medications Ordered in ED Medications - No data to display   Initial Impression / Assessment and Plan / ED Course  I have reviewed the triage vital signs and the nursing notes.  Pertinent labs & imaging results that were available during my care of the patient were reviewed by me and considered in my medical decision making (see chart for details).         Hemodynamically stable 78 year old woman presents to the emergency department for evaluation of possible anemia noted at outside lab work testing today.  Tarry stool noted on physical examination and GI sources of the suspected place of bleeding.  Takes Eliquis.  Otherwise hemodynamically stable under my care.  No complaints at this time.  Started receiving blood transfusion while in the emergency department.  I consented the patient at the bedside for this and she is in agreement with the plan.  Risks and benefits and alternatives discussed with the patient.  Patient admitted to the hospital for further management. Final Clinical Impressions(s) / ED Diagnoses   Final diagnoses:  Anemia due to acute blood loss     ED Discharge Orders    None       Andee Poles, MD 03/24/19 2351    Carmin Muskrat, MD 03/25/19 [phone]

## 2019-03-24 NOTE — ED Notes (Addendum)
Nurse Navigator communication with pt asked if she would like for me to contact her family. She states "they know that I am here." Comfort measures performed.

## 2019-03-24 NOTE — ED Notes (Signed)
Admitting provider bedside 

## 2019-03-25 ENCOUNTER — Inpatient Hospital Stay (HOSPITAL_COMMUNITY): Payer: Medicare Other

## 2019-03-25 ENCOUNTER — Other Ambulatory Visit: Payer: Self-pay

## 2019-03-25 ENCOUNTER — Encounter (HOSPITAL_COMMUNITY): Payer: Self-pay

## 2019-03-25 DIAGNOSIS — Z7901 Long term (current) use of anticoagulants: Secondary | ICD-10-CM

## 2019-03-25 DIAGNOSIS — R0989 Other specified symptoms and signs involving the circulatory and respiratory systems: Secondary | ICD-10-CM

## 2019-03-25 LAB — GLUCOSE, CAPILLARY: Glucose-Capillary: 104 mg/dL — ABNORMAL HIGH (ref 70–99)

## 2019-03-25 LAB — URINALYSIS, ROUTINE W REFLEX MICROSCOPIC
Bacteria, UA: NONE SEEN
Bilirubin Urine: NEGATIVE
Glucose, UA: NEGATIVE mg/dL
Ketones, ur: NEGATIVE mg/dL
Leukocytes,Ua: NEGATIVE
Nitrite: NEGATIVE
Protein, ur: NEGATIVE mg/dL
Specific Gravity, Urine: 1.01 (ref 1.005–1.030)
pH: 5 (ref 5.0–8.0)

## 2019-03-25 LAB — MRSA PCR SCREENING: MRSA by PCR: NEGATIVE

## 2019-03-25 LAB — BASIC METABOLIC PANEL
Anion gap: 8 (ref 5–15)
BUN: 54 mg/dL — ABNORMAL HIGH (ref 8–23)
CO2: 29 mmol/L (ref 22–32)
Calcium: 10.7 mg/dL — ABNORMAL HIGH (ref 8.9–10.3)
Chloride: 103 mmol/L (ref 98–111)
Creatinine, Ser: 2.66 mg/dL — ABNORMAL HIGH (ref 0.44–1.00)
GFR calc Af Amer: 19 mL/min — ABNORMAL LOW (ref >60)
GFR calc non Af Amer: 17 mL/min — ABNORMAL LOW (ref >60)
Glucose, Bld: 96 mg/dL (ref 70–99)
Potassium: 3.7 mmol/L (ref 3.5–5.1)
Sodium: 140 mmol/L (ref 135–145)

## 2019-03-25 LAB — CBC
HCT: 24.5 % — ABNORMAL LOW (ref 36.0–46.0)
Hemoglobin: 8.1 g/dL — ABNORMAL LOW (ref 12.0–15.0)
MCH: 30.8 pg (ref 26.0–34.0)
MCHC: 33.1 g/dL (ref 30.0–36.0)
MCV: 93.2 fL (ref 80.0–100.0)
Platelets: 134 10*3/uL — ABNORMAL LOW (ref 150–400)
RBC: 2.63 MIL/uL — ABNORMAL LOW (ref 3.87–5.11)
RDW: 17.7 % — ABNORMAL HIGH (ref 11.5–15.5)
WBC: 7.4 10*3/uL (ref 4.0–10.5)
nRBC: 0.4 % — ABNORMAL HIGH (ref 0.0–0.2)

## 2019-03-25 MED ORDER — MELATONIN 3 MG PO TABS
6.0000 mg | ORAL_TABLET | Freq: Every evening | ORAL | Status: DC | PRN
  Administered 2019-03-25 – 2019-03-29 (×4): 6 mg via ORAL
  Filled 2019-03-25 (×6): qty 2

## 2019-03-25 MED ORDER — BOOST / RESOURCE BREEZE PO LIQD CUSTOM
1.0000 | Freq: Three times a day (TID) | ORAL | Status: DC
  Administered 2019-03-25 – 2019-03-28 (×6): 1 via ORAL
  Filled 2019-03-25: qty 1

## 2019-03-25 MED ORDER — OXYCODONE HCL 5 MG PO TABS
5.0000 mg | ORAL_TABLET | ORAL | Status: DC | PRN
  Administered 2019-03-25 – 2019-03-30 (×10): 5 mg via ORAL
  Filled 2019-03-25 (×10): qty 1

## 2019-03-25 MED ORDER — SODIUM CHLORIDE 0.9% FLUSH
10.0000 mL | INTRAVENOUS | Status: DC | PRN
  Administered 2019-03-30: 10 mL
  Filled 2019-03-25: qty 40

## 2019-03-25 MED ORDER — FUROSEMIDE 10 MG/ML IJ SOLN
20.0000 mg | Freq: Once | INTRAMUSCULAR | Status: AC
  Administered 2019-03-25: 20 mg via INTRAVENOUS
  Filled 2019-03-25: qty 2

## 2019-03-25 MED ORDER — ORAL CARE MOUTH RINSE
15.0000 mL | Freq: Two times a day (BID) | OROMUCOSAL | Status: DC
  Administered 2019-03-25 – 2019-03-30 (×10): 15 mL via OROMUCOSAL

## 2019-03-25 MED ORDER — POTASSIUM CHLORIDE 10 MEQ/100ML IV SOLN
10.0000 meq | INTRAVENOUS | Status: AC
  Administered 2019-03-25 (×4): 10 meq via INTRAVENOUS
  Filled 2019-03-25 (×4): qty 100

## 2019-03-25 NOTE — Progress Notes (Signed)
Pharmacy notified, Protonix not sent to the floor. Will start med once it is available.

## 2019-03-25 NOTE — Progress Notes (Signed)
Pt urinated about 110 cc, bladder scan repeated and showed 570 ml of urine in bladder.  In and out catheterization ordered, and performed by Lorenso Courier, RN and [place], G, RN at the bedside 5w29 for acute urinary retention. Procedure explained to pt, perineal care completed, sterile technique maintained throughout procedure. Obtained 660ml of clear yellow urine. Pt tolerated procedure well. Resting comfortably. Will continue to monitor pt closely.

## 2019-03-25 NOTE — Progress Notes (Signed)
Pt receiving second unit of blood, IV potassium x 4, Protonix IV. Generalized edema, increased since admission, more prone in upper extremities. Crackles auscultated in lower lobes. Pt denied SOB, SpO2 100% on 2 L nasal cannula.  Blood paused for 30 minutes and potassium infusing at 99ml/hr. Lasix 20mg  ordered and given, STAT chest X ray completed and Baltazar Najjar, NP notified.  Will continue to monitor pt.

## 2019-03-25 NOTE — ED Notes (Signed)
ED TO INPATIENT HANDOFF REPORT   ED Nurse Name and Phone #: [phone] Magda Kiel Name/Age/Gender Diane [name] 78 y.o. female Room/Bed: 015C/015C  Code Status   Code Status: Full Code  Home/SNF/Other Nursing Home Patient oriented to: self and place Is this baseline? Yes   Triage Complete: Triage complete  Chief Complaint Low HGB,Weakness,SOB  Triage Note Pt EMS, [facility], called for low hemoglobin, weakness and dizziness.  C/o N/V earlier in the day.  On 2L O2 normally.  Currently treated for UTI, no pain except left toe pain.    Dark tarry stools. CBG -147 O2 - 100% on 2L BP - 136/55   Allergies Allergies  Allergen Reactions  . Septra Ds [Sulfamethoxazole-Trimethoprim] Nausea Only    Level of Care/Admitting Diagnosis ED Disposition    ED Disposition Condition [facility]: [facility] [100100]  Level of Care: Progressive [102]  Covid Evaluation: N/A  Diagnosis: Melena [170500]  Admitting Physician: Doreatha Massed  Attending Physician: Etta Quill [phone]  Estimated length of stay: past midnight tomorrow  Certification:: I certify this patient will need inpatient services for at least 2 midnights  PT Class (Do Not Modify): Inpatient [101]  PT Acc Code (Do Not Modify): Private [1]       B Medical/Surgery History Past Medical History:  Diagnosis Date  . Anemia   . Aortic atherosclerosis ([place]) 12/03/2017   Noted on CT scan  . CAD (coronary artery disease), native coronary artery 12/03/2017   Noted on CT scan by calcification   . CHF (congestive heart failure) ([place])   . Chronic diastolic heart failure ([place]) 12/03/2017  . Dyspnea   . GERD (gastroesophageal reflux disease)   . Heart murmur   . History of blood transfusion    "related to chemo" (12/05/2017)  . Hypertension   . Hypertensive heart disease 12/03/2017  . Kidney disease, chronic, stage III (GFR 30-59 ml/min) ([place]) 12/03/2017  . Lung  cancer ([place]) dx'd 02/2010  . Mitral valve prolapse   . Morbid obesity ([place]) 12/03/2017  . On home oxygen therapy   . 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 Paroxysmal atrial fibrillation (H[facility])   . Persistent atrial fibrillation 12/03/2017  . Restrictive lung disease 05/23/2017   Past Surgical History:  Procedure Laterality Date  . BIOPSY  07/16/2018   Procedure: BIOPSY;  Surgeon: Mauri Pole, MD;  Location: [facility]COPY;  Service: Endoscopy;;  . CARDIOVERSION N/A 12/06/2017   Procedure: CARDIOVERSION;  Surgeon: Jacolyn Reedy, MD;  Location: [facility] ENDOSCOPY;  Service: Cardiovascular;  Laterality: N/A;  . CATARACT EXTRACTION W/ INTRAOCULAR LENS  IMPLANT, BILATERAL Bilateral   . COLONOSCOPY WITH PROPOFOL N/A 07/16/2018   Procedure: COLONOSCOPY WITH PROPOFOL;  Surgeon: Mauri Pole, MD;  [facility];  Service: Endoscopy;  Laterality: N/A;  . DERMABRASION  ~ 1966   "face; for acne"  . ESOPHAGOGASTRODUODENOSCOPY (EGD) WITH PROPOFOL N/A 07/16/2018   Procedure: ESOPHAGOGASTRODUODENOSCOPY (EGD) WITH PROPOFOL;  Surgeon: Mauri Pole, MD;  [facility];  Service: Endoscopy;  Laterality: N/A;  . ORIF ANKLE FRACTURE Left 12/06/2018   Procedure: OPEN REDUCTION INTERNAL FIXATION (ORIF) ANKLE FRACTURE;  Surgeon: Nicholes Stairs, MD;  Location: [facility];  Service: Orthopedics;  Laterality: Left;  . PAROTID GLAND TUMOR EXCISION Left   . POLYPECTOMY  07/16/2018   Procedure: POLYPECTOMY;  Surgeon: Mauri Pole, MD;  [facility];  Service: Endoscopy;;  .  PORTACATH PLACEMENT  11/03/08-[place]   tip in mid SVC-E. Mansell  . THORACENTESIS Right   . TONSILLECTOMY    . VIDEO BRONCHOSCOPY WITH ENDOBRONCHIAL ULTRASOUND Right 05/08/2018   Procedure: VIDEO BRONCHOSCOPY WITH ENDOBRONCHIAL ULTRASOUND;  Surgeon: Collene Gobble, MD;  Location: [facility];  Service: Thoracic;  Laterality: Right;     A IV Location/Drains/Wounds Patient Lines/Drains/Airways  Status   Active Line/Drains/Airways    Name:   Placement date:   Placement time:   Site:   Days:   Implanted Port 12/03/17 Left Chest   12/03/17    2038    Chest   477   External Urinary Catheter   02/14/19    0030    -   39   Incision (Closed) 12/06/18 Ankle Left   12/06/18    1442     109   Wound / Incision (Open or Dehisced) 12/01/18 Non-pressure wound Back Right;Mid dime size reddened area where only top layer of skin is missing (excoriation)   12/01/18    1000    Back   114          Intake/Output Last 24 hours No intake or output data in the 24 hours ending 03/25/19 0014  Labs/Imaging Results for orders placed or performed during the hospital encounter of 03/24/19 (from the past 48 hour(s))  Protime-INR     Status: Abnormal   Collection Time: 03/24/19  9:50 PM  Result Value Ref Range   Prothrombin Time 21.8 (H) 11.4 - 15.2 seconds   INR 1.9 (H) 0.8 - 1.2    Comment: (NOTE) INR goal varies based on device and disease states. Performed at [facility], [place] [address], [place], [place] [address]   CBC with Differential     Status: Abnormal   Collection Time: 03/24/19  9:51 PM  Result Value Ref Range   WBC 6.8 4.0 - 10.5 K/uL   RBC 1.43 (L) 3.87 - 5.11 MIL/uL   Hemoglobin 4.7 (LL) 12.0 - 15.0 g/dL    Comment: REPEATED TO VERIFY THIS CRITICAL RESULT HAS VERIFIED AND BEEN CALLED TO C GILFORD,RN BY WALTER BOND ON 04 27 2020 AT 2219, AND HAS BEEN READ BACK.  THIS CRITICAL RESULT HAS VERIFIED AND BEEN CALLED TO L GILFORD,RN BY WALTER BOND ON 04 27 2020 AT 2222, AND HAS BEEN READ BACK.     HCT 15.0 (L) 36.0 - 46.0 %   MCV 104.9 (H) 80.0 - 100.0 fL   MCH 32.9 26.0 - 34.0 pg   MCHC 31.3 30.0 - 36.0 g/dL   RDW 15.4 11.5 - 15.5 %   Platelets 150 150 - 400 K/uL   nRBC 0.0 0.0 - 0.2 %   Neutrophils Relative % 79 %   Neutro Abs 5.3 1.7 - 7.7 K/uL   Lymphocytes Relative 10 %   Lymphs Abs 0.7 0.7 - 4.0 K/uL   Monocytes Relative 9 %   Monocytes Absolute 0.6 0.1 - 1.0 K/uL    Eosinophils Relative 1 %   Eosinophils Absolute 0.1 0.0 - 0.5 K/uL   Basophils Relative 0 %   Basophils Absolute 0.0 0.0 - 0.1 K/uL   Immature Granulocytes 1 %   Abs Immature Granulocytes 0.07 0.00 - 0.07 K/uL    Comment: Performed at [facility] [address] [place], [place] [address]  Comprehensive metabolic panel     Status: Abnormal   Collection Time: 03/24/19  9:51 PM  Result Value Ref Range   Sodium  137 135 - 145 mmol/L   Potassium 2.9 (L) 3.5 - 5.1 mmol/L   Chloride 100 98 - 111 mmol/L   CO2 32 22 - 32 mmol/L   Glucose, Bld 119 (H) 70 - 99 mg/dL   BUN 59 (H) 8 - 23 mg/dL   Creatinine, Ser 2.85 (H) 0.44 - 1.00 mg/dL   Calcium 10.9 (H) 8.9 - 10.3 mg/dL   Total Protein 4.9 (L) 6.5 - 8.1 g/dL   Albumin 2.4 (L) 3.5 - 5.0 g/dL   AST 14 (L) 15 - 41 U/L   ALT 10 0 - 44 U/L   Alkaline Phosphatase 26 (L) 38 - 126 U/L   Total Bilirubin 0.6 0.3 - 1.2 mg/dL   GFR calc non Af Amer 15 (L) >60 mL/min   GFR calc Af Amer 18 (L) >60 mL/min   Anion gap 5 5 - 15    Comment: Performed at [facility], [place] [address], [place], [place] [address]  Type and screen [place]     Status: None (Preliminary result)   Collection Time: 03/24/19 10:02 PM  Result Value Ref Range   ABO/RH(D) A POS    Antibody Screen NEG    Sample Expiration      03/27/2019 Performed at [facility], [place] [address] [place], [place] [address]    Unit Number [id number]    Blood Component Type RED CELLS,LR    Unit division 00    Status of Unit ALLOCATED    Transfusion Status OK TO TRANSFUSE    Crossmatch Result Compatible    Unit Number [id number]    Blood Component Type RED CELLS,LR    Unit division 00    Status of Unit ALLOCATED    Transfusion Status OK TO TRANSFUSE    Crossmatch Result Compatible   Prepare RBC     Status: None   Collection Time: 03/24/19 10:35 PM  Result Value Ref Range   Order Confirmation      ORDER PROCESSED BY BLOOD BANK Performed at [facility], [place] [address], [place], [place] [address]   Prepare RBC     Status: None   Collection Time: 03/24/19 11:12 PM  Result Value Ref Range   Order Confirmation      BB SAMPLE OR UNITS ALREADY AVAILABLE Performed at [facility], [place] [address], [place], [place] [address]    No results found.  Pending Labs Unresulted Labs (From admission, onward)    Start     Ordered   03/25/19 0500  CBC  Tomorrow morning,   R     03/24/19 2314   03/25/19 7824  Basic metabolic panel  Tomorrow morning,   R     03/24/19 2314   03/24/19 2155  CBC  ONCE - STAT,   STAT     03/24/19 2154   03/24/19 2155  Urinalysis, Routine w reflex microscopic  Once,   STAT     03/24/19 2155   03/24/19 2151  Urinalysis, Routine w reflex microscopic  Once,   R     03/24/19 2151          Vitals/Pain Today's Vitals   03/24/19 2230 03/24/19 2235 03/24/19 2300 03/25/19 0000  BP: (!) 114/47     Pulse: 77 80 73 83  Resp: (!) 22 (!) 22 (!) 21 18  Temp:      TempSrc:      SpO2: 100% 95% 100% 100%  PainSc:  Isolation Precautions No active isolations  Medications Medications  sodium chloride flush (NS) 0.9 % injection 3 mL (has no administration in time range)  0.9 %  sodium chloride infusion (Manually program via Guardrails IV Fluids) (has no administration in time range)  0.9 %  sodium chloride infusion (Manually program via Guardrails IV Fluids) (has no administration in time range)  pantoprazole (PROTONIX) 80 mg in sodium chloride 0.9 % 100 mL IVPB (has no administration in time range)  pantoprazole (PROTONIX) 80 mg in sodium chloride 0.9 % 250 mL (0.32 mg/mL) infusion (has no administration in time range)  pantoprazole (PROTONIX) injection 40 mg (has no administration in time range)  acetaminophen (TYLENOL) tablet 650 mg (has no administration in time range)    Or  acetaminophen (TYLENOL) suppository 650 mg (has no administration in time range)  ondansetron (ZOFRAN) tablet 4 mg (has  no administration in time range)    Or  ondansetron (ZOFRAN) injection 4 mg (has no administration in time range)  umeclidinium-vilanterol (ANORO ELLIPTA) 62.5-25 MCG/INH 1 puff (has no administration in time range)  amiodarone (PACERONE) tablet 200 mg (has no administration in time range)  albuterol (VENTOLIN HFA) 108 (90 Base) MCG/ACT inhaler 2 puff (has no administration in time range)  carvedilol (COREG) tablet 12.5 mg (has no administration in time range)    Mobility  High fall risk   Focused Assessments    R Recommendations: See Admitting Provider Note  Report given to:   Additional Notes:

## 2019-03-25 NOTE — Progress Notes (Signed)
PROGRESS NOTE    Diane [name] [facility]  ACZ:[id number] DOB: 12-11-1940 DOA: 03/24/2019 PCP: [name], MD    Brief Narrative:  78 y.o. female with medical history significant of NSCLC currently in remission, chronic diastolic CHF, HTN, A.Fib on Eliquis.  Patient presents to the ED after lab work testing today revealed low HGB.  She has had melena for "just a few days".  Sent in to ED for evaluation.  Assessment & Plan:   Principal Problem:   Melena Active Problems:   Chronic diastolic heart failure (HCC)   Persistent atrial fibrillation; CHA2DS2Vacs = 6; Eliquis; Amiodarone for rhythm control   Kidney disease, chronic, stage III (GFR 30-59 ml/min) (HCC)   Hypertensive heart and chronic kidney disease with heart failure and stage 1 through stage 4 chronic kidney disease, or chronic kidney disease (HCC)   COPD (chronic obstructive pulmonary disease) ([place])   AKI (acute kidney injury) ([place])   Acute blood loss anemia   1. GIB with melena and acute blood loss anemia - 1. Transfused 3u PRBC overnight 2. Repeat CBC in AM 3. GI consulted. Tentative plan for EGD tomorrow 4. Keep on clears now, plan NPO after midnight 5. Cont on PPI 2. PAF - 1. Holding eliquis 2. Continue amiodarone 3. Coreg on hold secondary to soft BP overnight 3. CHF - 1. Coreg on hold secondary to soft BP overnight 2. holding demadex secondary to ARF 4. AKI on CKD stage 3 - presumably due to hemorrhage 1. Baseline Cr in the 1.6 range, currently 2.66 2. Holding demadex per above 3. Repeat BMP in AM 5. COPD - 1. Cont home nebs 2. On minimal O2 support  DVT prophylaxis: SCD's Code Status: Full Family Communication: Pt in room, family not at bedside Disposition Plan:  Home when stable and cleared by GI  Consultants:   GI  Procedures:     Antimicrobials: Anti-infectives (From admission, onward)   None       Subjective: Denies abd pain or sob  Objective: Vitals:   03/25/19 0907  03/25/19 0908 03/25/19 0912 03/25/19 1204  BP:  (!) 103/49  (!) 110/54  Pulse:  77 77 79  Resp:  19 17 10   Temp: 98.2 F (36.8 C)   98 F (36.7 C)  TempSrc:  Oral  Oral  SpO2:  100% 100% 100%    Intake/Output Summary (Last 24 hours) at 03/25/2019 1621 Last data filed at 03/25/2019 0418 Gross per 24 hour  Intake 676.34 ml  Output -  Net 676.34 ml   There were no vitals filed for this visit.  Examination:  General exam: Appears calm and comfortable  Respiratory system: Clear to auscultation. Respiratory effort normal. Cardiovascular system: S1 & S2 heard, RRR. Gastrointestinal system: Abdomen is nondistended, soft and nontender. No organomegaly or masses felt. Normal bowel sounds heard. Central nervous system: Alert and oriented. No focal neurological deficits. Extremities: Symmetric 5 x 5 power. Skin: No rashes, lesions Psychiatry: Judgement and insight appear normal. Mood & affect appropriate.   Data Reviewed: I have personally reviewed following labs and imaging studies  CBC: Recent Labs  Lab 03/24/19 2151 03/25/19 1438  WBC 6.8 7.4  NEUTROABS 5.3  --   HGB 4.7* 8.1*  HCT 15.0* 24.5*  MCV 104.9* 93.2  PLT 150 109*   Basic Metabolic Panel: Recent Labs  Lab 03/24/19 2151 03/25/19 1434  NA 137 140  K 2.9* 3.7  CL 100 103  CO2 32 29  GLUCOSE 119* 96  BUN 59*  54*  CREATININE 2.85* 2.66*  CALCIUM 10.9* 10.7*   GFR: CrCl cannot be calculated (Unknown ideal weight.). Liver Function Tests: Recent Labs  Lab 03/24/19 2151  AST 14*  ALT 10  ALKPHOS 26*  BILITOT 0.6  PROT 4.9*  ALBUMIN 2.4*   No results for input(s): LIPASE, AMYLASE in the last 168 hours. No results for input(s): AMMONIA in the last 168 hours. Coagulation Profile: Recent Labs  Lab 03/24/19 2150  INR 1.9*   Cardiac Enzymes: No results for input(s): CKTOTAL, CKMB, CKMBINDEX, TROPONINI in the last 168 hours. BNP (last 3 results) No results for input(s): PROBNP in the last 8760 hours.  HbA1C: No results for input(s): HGBA1C in the last 72 hours. CBG: Recent Labs  Lab 03/25/19 0138  GLUCAP 104*   Lipid Profile: No results for input(s): CHOL, HDL, LDLCALC, TRIG, CHOLHDL, LDLDIRECT in the last 72 hours. Thyroid Function Tests: No results for input(s): TSH, T4TOTAL, FREET4, T3FREE, THYROIDAB in the last 72 hours. Anemia Panel: No results for input(s): VITAMINB12, FOLATE, FERRITIN, TIBC, IRON, RETICCTPCT in the last 72 hours. Sepsis Labs: No results for input(s): PROCALCITON, LATICACIDVEN in the last 168 hours.  Recent Results (from the past 240 hour(s))  MRSA PCR Screening     Status: None   Collection Time: 03/25/19  2:38 AM  Result Value Ref Range Status   MRSA by PCR NEGATIVE NEGATIVE Final    Comment:        The GeneXpert MRSA Assay (FDA approved for NASAL specimens only), is one component of a comprehensive MRSA colonization surveillance program. It is not intended to diagnose MRSA infection nor to guide or monitor treatment for MRSA infections. Performed at [facility], [place] [address], [place], [place] [address]      Radiology Studies: Dg Chest Port 1 View  Result Date: 03/25/2019 CLINICAL DATA:  Bibasilar crackles. Edema. Congestive heart failure. Right lung cancer. EXAM: PORTABLE CHEST 1 VIEW COMPARISON:  Two-view chest x-ray 02/13/2019 FINDINGS: A left subclavian Port-A-Cath is stable. Chronic volume loss is present at the right base. New right basilar airspace disease is present. The left lung is clear. Lung volumes are low. IMPRESSION: 1. New right basilar airspace disease superimposed on chronic volume loss. Findings are concerning for infection/pneumonia. 2. The left lung is clear. 3. Stable Port-A-Cath. Electronically Signed   By: San Morelle M.D.   On: 03/25/2019 05:29    Scheduled Meds: . amiodarone  200 mg Oral Daily  . mouth rinse  15 mL Mouth Rinse BID  . [START ON 03/28/2019] pantoprazole  40 mg Intravenous Q12H  .  umeclidinium-vilanterol  1 puff Inhalation Daily   Continuous Infusions: . pantoprozole (PROTONIX) infusion 8 mg/hr (03/25/19 1303)     LOS: 1 day   Marylu Lund, MD [facility] Pager On Amion  If 7PM-7AM, please contact night-coverage 03/25/2019, 4:21 PM

## 2019-03-25 NOTE — Progress Notes (Addendum)
BP running on low side 87/41:  Just starting first unit PRBC now, no further stools since presentation, no tachycardia with this. 1) stopping coreg 2) continue volume resuscitation for the hypotension (which has now just started, volume with PRBC). 3) If needed, then next step is to turn PRBC transfusion rate up. 4) Patient still mentating, dont think she needs pressors at the moment. 5) dont want to use non PRBC fluids a the moment due to likely causing worsening hemodilution with these  Note: BP was running low BEFORE transfusion was started (low BP is not a transfusion reaction).

## 2019-03-25 NOTE — Progress Notes (Signed)
Chest x ray completed, Baltazar Najjar, NP notified. Lasix given at 0501, pt did not urinate, bladder scan showed 390, pt attempted to urinate, but was not successful. Baltazar Najjar, NP aware.

## 2019-03-25 NOTE — Consult Note (Addendum)
[facility] Consult: 10:25 AM 03/25/2019  LOS: 1 day    Referring Provider: Dr Wyline Copas  Primary Care Physician:  [name], MD Primary Gastroenterologist:  Dr Silverio Decamp    Reason for Consultation:  GIB, anemia.     HPI: Diane [name] is a 78 y.o. female.  Resident of [facility].  PMH Non small cell lung cancer, treated with chemo/radiation, completed 06/2007.  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  Chemotherapy with Alimta through 02/2010.  Other cancer med includes Tarceva.  At CT chest 07/2018 she had stable right lung nodules she is due for surveillance chest CT this month.Marland Kitchen  COPD on home oxygen.  Chronic Eliquis for a fib.  B12 and iron deficiency anemia.  CKD stage 4.  Morbid obesity.  CHFd.   Chronic pain syndrome, on oxycodone.   Ovulation of anemia and screening colonoscopy, EGD 06/2018 EGD showed mild reflux esophagitis otherwise normal.  No biopsies obtained. Colonoscopy with resection of a total of 6 polyps ranging in size from 1 -5 mm.  Nonbleeding internal hemorrhoids.  Mild patchy sigmoid and descending erythema likely prep related.  No AVMs.  Polyp pathology revealed tubular adenomas, no HGD; Hyperplastic polyps.  She had been at the SNF after breaking her ankle in March, treated with antibiotics for pneumonia while there. Admitted 3/19 -02/18/2023 to [facility] with AKI.  Notes mention diarrhea at the time however she had no bowel movements during her admission.  She discharged back to the SNF. She tells me she has been taking an antibiotic for UTI.  No paperwork has arrived with the patient and her med list does not include an antibiotic. She has been feeling very weak.  She says she vomits almost daily producing a yellow-brownish emesis.  This occurs  mostly after eating.  Her rectum hurts, she has been constipated and taking different laxatives, managing to have brown stools every other day. Med list includes Eliquis, tid oral iron, daily oral B12, Zantac 150/day  Anemia noted at lab work yesterday.  Sent to the ED for evaluation.  Had a black, tarry stool in the ED.   Hgb 4.7 yesterday, third unit PRBCs currently transfusing. Hgb 10.2, MCV 10.2 on 02/13/19. MCV 106.   INR 1.9. Recurrent AKI, GFR corresponds with stage 4 CKD. Other than hypoalbuminemia, LFTs normal. The XR shows new findings of pneumonia in the right lung.    Past Medical History:  Diagnosis Date   Anemia    Aortic atherosclerosis ([place]) 12/03/2017   Noted on CT scan   CAD (coronary artery disease), native coronary artery 12/03/2017   Noted on CT scan by calcification    CHF (congestive heart failure) (HCC)    Chronic diastolic heart failure ([place]) 12/03/2017   Dyspnea    GERD (gastroesophageal reflux disease)    Heart murmur    History of blood transfusion    "related to chemo" (12/05/2017)   Hypertension    Hypertensive heart disease 12/03/2017   Kidney disease, chronic, stage III (GFR 30-59 ml/min) ([place]) 12/03/2017  Lung cancer ([place]) dx'd 02/2010   Mitral valve prolapse    Morbid obesity ([place]) 12/03/2017   On home oxygen therapy    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   Paroxysmal atrial fibrillation ([place])    Persistent atrial fibrillation 12/03/2017   Restrictive lung disease 05/23/2017    Past Surgical History:  Procedure Laterality Date   BIOPSY  07/16/2018   Procedure: BIOPSY;  Surgeon: Mauri Pole, MD;  Location: [facility];  Service: Endoscopy;;   CARDIOVERSION N/A 12/06/2017   Procedure: CARDIOVERSION;  Surgeon: Jacolyn Reedy, MD;  Location: [facility];  Service: Cardiovascular;  Laterality: N/A;   CATARACT EXTRACTION W/ INTRAOCULAR LENS  IMPLANT, BILATERAL Bilateral    COLONOSCOPY WITH PROPOFOL N/A 07/16/2018    Procedure: COLONOSCOPY WITH PROPOFOL;  Surgeon: Mauri Pole, MD;  Location: [facility];  Service: Endoscopy;  Laterality: N/A;   DERMABRASION  ~ 1966   "face; for acne"   ESOPHAGOGASTRODUODENOSCOPY (EGD) WITH PROPOFOL N/A 07/16/2018   Procedure: ESOPHAGOGASTRODUODENOSCOPY (EGD) WITH PROPOFOL;  Surgeon: Mauri Pole, MD;  Location: [facility];  Service: Endoscopy;  Laterality: N/A;   ORIF ANKLE FRACTURE Left 12/06/2018   Procedure: OPEN REDUCTION INTERNAL FIXATION (ORIF) ANKLE FRACTURE;  Surgeon: Nicholes Stairs, MD;  Location: [place] [facility];  Service: Orthopedics;  Laterality: Left;   PAROTID GLAND TUMOR EXCISION Left    POLYPECTOMY  07/16/2018   Procedure: POLYPECTOMY;  Surgeon: Mauri Pole, MD;  Location: [facility];  Service: Endoscopy;;   PORTACATH PLACEMENT  11/03/08-[place]   tip in mid SVC-E. [place]   THORACENTESIS Right    TONSILLECTOMY     VIDEO BRONCHOSCOPY WITH ENDOBRONCHIAL ULTRASOUND Right 05/08/2018   Procedure: VIDEO BRONCHOSCOPY WITH ENDOBRONCHIAL ULTRASOUND;  Surgeon: Collene Gobble, MD;  Location: [facility];  Service: Thoracic;  Laterality: Right;    Prior to Admission medications   Medication Sig Start Date End Date Taking? Authorizing Provider  acetaminophen (TYLENOL) 500 MG tablet Take 1,000 mg by mouth at bedtime.    [provider]  albuterol (PROAIR HFA) 108 (90 Base) MCG/ACT inhaler Inhale 2 puffs into the lungs every 4 (four) hours as needed for wheezing or shortness of breath. 07/08/18   Collene Gobble, MD  amiodarone (PACERONE) 200 MG tablet Take 1 tablet (200 mg total) by mouth daily. 10/02/18   Leonie Man, MD  ANORO ELLIPTA 62.5-25 MCG/INH AEPB INHALE 1 PUFF BY MOUTH INTO LUNGS ONCE DAILY Patient taking differently: Inhale 1 puff into the lungs daily.  09/19/18   Collene Gobble, MD  apixaban (ELIQUIS) 5 MG TABS tablet Take 1 tablet (5 mg total) by mouth 2 (two) times daily. OK to restart on Friday 05/10/18. Patient  taking differently: Take 5 mg by mouth 2 (two) times daily.  10/02/18   Leonie Man, MD  calcitRIOL (ROCALTROL) 0.5 MCG capsule Take 0.5 mcg by mouth 3 (three) times daily.  09/27/18   [provider]  carvedilol (COREG) 12.5 MG tablet Take 12.5 mg by mouth 2 (two) times daily with a meal.    [provider]  docusate sodium (COLACE) 100 MG capsule Take 1 capsule (100 mg total) by mouth 2 (two) times daily. Patient not taking: Reported on 01/28/2019 12/11/18   Cristal Ford, DO  ferrous sulfate 325 (65 FE) MG tablet Take 325 mg by mouth 3 (three) times daily with meals.     [provider]  Melatonin 10 MG TABS Take 10 mg by mouth daily.  [provider]  oxyCODONE (OXY IR/ROXICODONE) 5 MG immediate release tablet Take 1 tablet (5 mg total) by mouth every 4 (four) hours as needed for moderate pain. 02/17/19   Barb Merino, MD  OXYGEN Inhale 2 L into the lungs continuous.    [provider]  ranitidine (ZANTAC) 150 MG tablet Take 150 mg by mouth.  01/23/19   [provider]  torsemide (DEMADEX) 20 MG tablet Take 1 tablet (20 mg total) by mouth daily. 02/18/19   Barb Merino, MD  vitamin B-12 (CYANOCOBALAMIN) 1000 MCG tablet Take 1,000 mcg by mouth daily.    [provider]    Scheduled Meds:  amiodarone  200 mg Oral Daily   mouth rinse  15 mL Mouth Rinse BID   [START ON 03/28/2019] pantoprazole  40 mg Intravenous Q12H   umeclidinium-vilanterol  1 puff Inhalation Daily   Infusions:  pantoprozole (PROTONIX) infusion 8 mg/hr (03/25/19 0310)   PRN Meds: acetaminophen **OR** acetaminophen, albuterol, ondansetron **OR** ondansetron (ZOFRAN) IV, sodium chloride flush   Allergies as of 03/24/2019 - Review Complete 03/24/2019  Allergen Reaction Noted   Septra ds [sulfamethoxazole-trimethoprim] Nausea Only 07/14/2014    Family History  Problem Relation Age of Onset   Breast cancer Mother    Heart attack Mother     Diabetes Father    Heart attack Father    Diabetes Brother    COPD Cousin    Colon cancer Neg Hx     Social History   Socioeconomic History   Marital status: Married    Spouse name: Not on file   Number of children: Not on file   Years of education: Not on file   Highest education level: Not on file  Occupational History   Not on file  Social Needs   Financial resource strain: Not hard at all   Food insecurity:    Worry: Patient refused    Inability: Patient refused   Transportation needs:    Medical: Patient refused    Non-medical: Patient refused  Tobacco Use   Smoking status: Former Smoker    Packs/day: 2.00    Years: 40.00    Pack years: 80.00    Types: Cigarettes    Last attempt to quit: 11/27/2001    Years since quitting: 17.3   Smokeless tobacco: Never Used  Substance and Sexual Activity   Alcohol use: Yes    Comment: 12/05/2017 "once in a great while I might have a drink"   Drug use: No   Sexual activity: Not Currently  Lifestyle   Physical activity:    Days per week: 1 day    Minutes per session: 30 min   Stress: Only a little  Relationships   Social connections:    Talks on phone: Patient refused    Gets together: Patient refused    Attends religious service: Patient refused    Active member of club or organization: Patient refused    Attends meetings of clubs or organizations: Patient refused    Relationship status: Patient refused   Intimate partner violence:    Fear of current or ex partner: Patient refused    Emotionally abused: Patient refused    Physically abused: Patient refused    Forced sexual activity: Patient refused  Other Topics Concern   Not on file  Social History Narrative   [facility] (02/19/17):   Patient is originally from [facility] [place] [facility] [place] [facility]. She has also lived in [place]. She has worked primarily Veterinary surgeon. Has a dog currently. No  bird or mold exposure.     REVIEW OF SYSTEMS: Constitutional:  Weakness.   ENT:  No  nose bleeds Pulm: Denies shortness of breath.  Some coughing. CV:  No palpitations, no LE edema.  No chest pain GU:  No hematuria, no frequency GI: HPI. Heme: No unusual bleeding or bruising. Transfusions: 1 unit PRBCs in 11/2018. 3 U PRBCs in 11/2009 Neuro:  No headaches, no peripheral tingling or numbness Derm:  No itching, no rash or sores.  Endocrine:  No sweats or chills.  No polyuria or dysuria Immunization:  reviewed Travel:  None beyond local counties in last few months.    PHYSICAL EXAM: Vital signs in last 24 hours: Vitals:   03/25/19 0908 03/25/19 0912  BP: (!) 103/49   Pulse: 77 77  Resp: 19 17  Temp:    SpO2: 100% 100%   Wt Readings from Last 3 Encounters:  02/14/19 110.2 kg  12/08/18 133.4 kg  10/02/18 120.4 kg    General: Fretful, obese, chronically ill looking WF Head: No facial asymmetry or swelling.  No signs of head trauma. Eyes: No scleral icterus.  No conjunctival pallor.  EOMI Ears: Not hard of hearing Nose: No discharge or congestion. Mouth: Good dental repair.  Oral mucosa pink, moist, clear.  Tongue midline. Neck: No JVD, no thyromegaly. Lungs: Diminished but clear bilaterally.  No labored breathing.  No cough.  The cath positioned in the upper left chest Heart: RRR.  No MRG.  S1, S2 present. Abdomen: Soft.  Not tender, not distended.  Obese.  Active bowel sounds.  No hernias, bruits, organomegaly..   Rectal: Did not perform. Musc/Skeltl: No gross joint deformity.  Residual bruising versus hematin staining beneath the skin around the left ankle. Extremities: Pitting edema in the legs below the knee.  Patchy erythema and skin beneath the pressure stockings. Neurologic: Alert.  Oriented to place, self, year.  Moves all 4 limbs, strength not tested.  Intentional tremor in the hands/arms. Skin: Erythema on the lower legs. Tattoos: None. Nodes: No cervical adenopathy Psych: Fretful, somewhat anxious.  Cooperative  Intake/Output from previous  day: 04/27 0701 - 04/28 0700 In: 676.3 [I.V.:52.8; Blood:340; IV Piggyback:283.6] Out: -  Intake/Output this shift: No intake/output data recorded.  LAB RESULTS: Recent Labs    03/24/19 2151  WBC 6.8  HGB 4.7*  HCT 15.0*  PLT 150   BMET Lab Results  Component Value Date   NA 137 03/24/2019   NA 136 02/16/2019   NA 136 02/15/2019   K 2.9 (L) 03/24/2019   K 4.0 02/16/2019   K 3.7 02/15/2019   CL 100 03/24/2019   CL 102 02/16/2019   CL 103 02/15/2019   CO2 32 03/24/2019   CO2 27 02/16/2019   CO2 27 02/15/2019   GLUCOSE 119 (H) 03/24/2019   GLUCOSE 94 02/16/2019   GLUCOSE 91 02/15/2019   BUN 59 (H) 03/24/2019   BUN 24 (H) 02/16/2019   BUN 29 (H) 02/15/2019   CREATININE 2.85 (H) 03/24/2019   CREATININE 1.92 (H) 02/16/2019   CREATININE 2.26 (H) 02/15/2019   CALCIUM 10.9 (H) 03/24/2019   CALCIUM 10.8 (H) 02/16/2019   CALCIUM 10.1 02/15/2019   LFT Recent Labs    03/24/19 2151  PROT 4.9*  ALBUMIN 2.4*  AST 14*  ALT 10  ALKPHOS 26*  BILITOT 0.6   PT/INR Lab Results  Component Value Date   INR 1.9 (H) 03/24/2019   INR 1.8 (H) 02/13/2019   INR 1.44 11/29/2018  Hepatitis Panel No results for input(s): HEPBSAG, HCVAB, HEPAIGM, HEPBIGM in the last 72 hours. C-Diff No components found for: CDIFF Lipase  No results found for: LIPASE  Drugs of Abuse  No results found for: LABOPIA, COCAINSCRNUR, LABBENZ, AMPHETMU, THCU, LABBARB   RADIOLOGY STUDIES: Dg Chest Port 1 View  Result Date: 03/25/2019 CLINICAL DATA:  Bibasilar crackles. Edema. Congestive heart failure. Right lung cancer. EXAM: PORTABLE CHEST 1 VIEW COMPARISON:  Two-view chest x-ray 02/13/2019 FINDINGS: A left subclavian Port-A-Cath is stable. Chronic volume loss is present at the right base. New right basilar airspace disease is present. The left lung is clear. Lung volumes are low. IMPRESSION: 1. New right basilar airspace disease superimposed on chronic volume loss. Findings are concerning for  infection/pneumonia. 2. The left lung is clear. 3. Stable Port-A-Cath. Electronically Signed   By: San Morelle M.D.   On: 03/25/2019 05:29      IMPRESSION:   *   Acute on chronic anemia.  Macrocytic.  On oral iron and oral B12.   Nausea, nonbloody emesis.  Dark, melenic stool in the ED, do not see Hemoccult results in the labs., Mild reflux esophagitis on EGD and multiple colon polyps, adenomatous and hyperplastic on colonoscopy in 06/2018. Takes ranitidine 150 mg daily.   Transfusing with her third unit of blood now.  Repeat Hgb after that.  *    Chronic Eliquis for A. fib.  Presume her last dose was 03/24/2019.  *    Hx NSC lung cancer.  Has been treated with chemoradiation and then further chemo.  She is due for repeat CT scan this month according to last office visit note from [name]. CXR suggest right sided PNA.  No Abx in place.    PLAN:     *    Probably will need upper endoscopy, the soonest we would do this would be tomorrow but it might be in 2 days given she likely took Eliquis yesterday.   Okay to have clear liquids now.  *   She is currently on Protonix drip.  I am not going to change this but she might be able to switch to Protonix 40 mg IV BID.      Azucena Freed  03/25/2019, 10:25 AM Phone [phone]   Attending physician's note   I have taken a history, examined the patient and reviewed the chart. I agree with the Advanced Practitioner's note, impression and recommendations.  78 year old female with multiple comorbidities, on chronic Eliquis for A. fib admitted with worsening anemia and melena Hemoglobin 4.7 on admission, significantly lower compared to labs in March with hemoglobin 10.2.  Reviewed recent GI work-up and labs.  Clear liquids Transfuse PRBC to hemoglobin greater than 7 PPI IV Last dose of Eliquis 03/24/2019 We will plan for EGD tomorrow N.p.o. after midnight  K. Denzil Magnuson , MD [phone]

## 2019-03-25 NOTE — H&P (View-Only) (Signed)
[facility] Consult: 10:25 AM 03/25/2019  LOS: 1 day    Referring Provider: Dr Wyline Copas  Primary Care Physician:  [name], MD Primary Gastroenterologist:  Dr Silverio Decamp    Reason for Consultation:  GIB, anemia.     HPI: Diane [name] is a 79 y.o. female.  Resident of [facility].  PMH Non small cell lung cancer, treated with chemo/radiation, completed 06/2007.  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  Chemotherapy with Alimta through 02/2010.  Other cancer med includes Tarceva.  At CT chest 07/2018 she had stable right lung nodules she is due for surveillance chest CT this month.Marland Kitchen  COPD on home oxygen.  Chronic Eliquis for a fib.  B12 and iron deficiency anemia.  CKD stage 4.  Morbid obesity.  CHFd.   Chronic pain syndrome, on oxycodone.   Ovulation of anemia and screening colonoscopy, EGD 06/2018 EGD showed mild reflux esophagitis otherwise normal.  No biopsies obtained. Colonoscopy with resection of a total of 6 polyps ranging in size from 1 -5 mm.  Nonbleeding internal hemorrhoids.  Mild patchy sigmoid and descending erythema likely prep related.  No AVMs.  Polyp pathology revealed tubular adenomas, no HGD; Hyperplastic polyps.  She had been at the SNF after breaking her ankle in March, treated with antibiotics for pneumonia while there. Admitted 3/19 -02/18/2023 to [facility] with AKI.  Notes mention diarrhea at the time however she had no bowel movements during her admission.  She discharged back to the SNF. She tells me she has been taking an antibiotic for UTI.  No paperwork has arrived with the patient and her med list does not include an antibiotic. She has been feeling very weak.  She says she vomits almost daily producing a yellow-brownish emesis.  This occurs  mostly after eating.  Her rectum hurts, she has been constipated and taking different laxatives, managing to have brown stools every other day. Med list includes Eliquis, tid oral iron, daily oral B12, Zantac 150/day  Anemia noted at lab work yesterday.  Sent to the ED for evaluation.  Had a black, tarry stool in the ED.   Hgb 4.7 yesterday, third unit PRBCs currently transfusing. Hgb 10.2, MCV 10.2 on 02/13/19. MCV 106.   INR 1.9. Recurrent AKI, GFR corresponds with stage 4 CKD. Other than hypoalbuminemia, LFTs normal. The XR shows new findings of pneumonia in the right lung.    Past Medical History:  Diagnosis Date   Anemia    Aortic atherosclerosis ([place]) 12/03/2017   Noted on CT scan   CAD (coronary artery disease), native coronary artery 12/03/2017   Noted on CT scan by calcification    CHF (congestive heart failure) (HCC)    Chronic diastolic heart failure ([place]) 12/03/2017   Dyspnea    GERD (gastroesophageal reflux disease)    Heart murmur    History of blood transfusion    "related to chemo" (12/05/2017)   Hypertension    Hypertensive heart disease 12/03/2017   Kidney disease, chronic, stage III (GFR 30-59 ml/min) ([place]) 12/03/2017  Lung cancer ([place]) dx'd 02/2010   Mitral valve prolapse    Morbid obesity ([place]) 12/03/2017   On home oxygen therapy    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   Paroxysmal atrial fibrillation ([place])    Persistent atrial fibrillation 12/03/2017   Restrictive lung disease 05/23/2017    Past Surgical History:  Procedure Laterality Date   BIOPSY  07/16/2018   Procedure: BIOPSY;  Surgeon: Mauri Pole, MD;  Location: [facility];  Service: Endoscopy;;   CARDIOVERSION N/A 12/06/2017   Procedure: CARDIOVERSION;  Surgeon: Jacolyn Reedy, MD;  Location: [facility];  Service: Cardiovascular;  Laterality: N/A;   CATARACT EXTRACTION W/ INTRAOCULAR LENS  IMPLANT, BILATERAL Bilateral    COLONOSCOPY WITH PROPOFOL N/A 07/16/2018    Procedure: COLONOSCOPY WITH PROPOFOL;  Surgeon: Mauri Pole, MD;  Location: [facility];  Service: Endoscopy;  Laterality: N/A;   DERMABRASION  ~ 1966   "face; for acne"   ESOPHAGOGASTRODUODENOSCOPY (EGD) WITH PROPOFOL N/A 07/16/2018   Procedure: ESOPHAGOGASTRODUODENOSCOPY (EGD) WITH PROPOFOL;  Surgeon: Mauri Pole, MD;  Location: [facility];  Service: Endoscopy;  Laterality: N/A;   ORIF ANKLE FRACTURE Left 12/06/2018   Procedure: OPEN REDUCTION INTERNAL FIXATION (ORIF) ANKLE FRACTURE;  Surgeon: Nicholes Stairs, MD;  Location: [facility];  Service: Orthopedics;  Laterality: Left;   PAROTID GLAND TUMOR EXCISION Left    POLYPECTOMY  07/16/2018   Procedure: POLYPECTOMY;  Surgeon: Mauri Pole, MD;  Location: [facility];  Service: Endoscopy;;   PORTACATH PLACEMENT  11/03/08-[place]   tip in mid SVC-E. [place]   THORACENTESIS Right    TONSILLECTOMY     VIDEO BRONCHOSCOPY WITH ENDOBRONCHIAL ULTRASOUND Right 05/08/2018   Procedure: VIDEO BRONCHOSCOPY WITH ENDOBRONCHIAL ULTRASOUND;  Surgeon: Collene Gobble, MD;  Location: [place];  Service: Thoracic;  Laterality: Right;    Prior to Admission medications   Medication Sig Start Date End Date Taking? Authorizing Provider  acetaminophen (TYLENOL) 500 MG tablet Take 1,000 mg by mouth at bedtime.    [provider]  albuterol (PROAIR HFA) 108 (90 Base) MCG/ACT inhaler Inhale 2 puffs into the lungs every 4 (four) hours as needed for wheezing or shortness of breath. 07/08/18   Collene Gobble, MD  amiodarone (PACERONE) 200 MG tablet Take 1 tablet (200 mg total) by mouth daily. 10/02/18   Leonie Man, MD  ANORO ELLIPTA 62.5-25 MCG/INH AEPB INHALE 1 PUFF BY MOUTH INTO LUNGS ONCE DAILY Patient taking differently: Inhale 1 puff into the lungs daily.  09/19/18   Collene Gobble, MD  apixaban (ELIQUIS) 5 MG TABS tablet Take 1 tablet (5 mg total) by mouth 2 (two) times daily. OK to restart on Friday 05/10/18. Patient  taking differently: Take 5 mg by mouth 2 (two) times daily.  10/02/18   Leonie Man, MD  calcitRIOL (ROCALTROL) 0.5 MCG capsule Take 0.5 mcg by mouth 3 (three) times daily.  09/27/18   [provider]  carvedilol (COREG) 12.5 MG tablet Take 12.5 mg by mouth 2 (two) times daily with a meal.    [provider]  docusate sodium (COLACE) 100 MG capsule Take 1 capsule (100 mg total) by mouth 2 (two) times daily. Patient not taking: Reported on 01/28/2019 12/11/18   Cristal Ford, DO  ferrous sulfate 325 (65 FE) MG tablet Take 325 mg by mouth 3 (three) times daily with meals.     [provider]  Melatonin 10 MG TABS Take 10 mg by mouth daily.  [provider]  oxyCODONE (OXY IR/ROXICODONE) 5 MG immediate release tablet Take 1 tablet (5 mg total) by mouth every 4 (four) hours as needed for moderate pain. 02/17/19   Barb Merino, MD  OXYGEN Inhale 2 L into the lungs continuous.    [provider]  ranitidine (ZANTAC) 150 MG tablet Take 150 mg by mouth.  01/23/19   [provider]  torsemide (DEMADEX) 20 MG tablet Take 1 tablet (20 mg total) by mouth daily. 02/18/19   Barb Merino, MD  vitamin B-12 (CYANOCOBALAMIN) 1000 MCG tablet Take 1,000 mcg by mouth daily.    [provider]    Scheduled Meds:  amiodarone  200 mg Oral Daily   mouth rinse  15 mL Mouth Rinse BID   [START ON 03/28/2019] pantoprazole  40 mg Intravenous Q12H   umeclidinium-vilanterol  1 puff Inhalation Daily   Infusions:  pantoprozole (PROTONIX) infusion 8 mg/hr (03/25/19 0310)   PRN Meds: acetaminophen **OR** acetaminophen, albuterol, ondansetron **OR** ondansetron (ZOFRAN) IV, sodium chloride flush   Allergies as of 03/24/2019 - Review Complete 03/24/2019  Allergen Reaction Noted   Septra ds [sulfamethoxazole-trimethoprim] Nausea Only 07/14/2014    Family History  Problem Relation Age of Onset   Breast cancer Mother    Heart attack Mother     Diabetes Father    Heart attack Father    Diabetes Brother    COPD Cousin    Colon cancer Neg Hx     Social History   Socioeconomic History   Marital status: Married    Spouse name: Not on file   Number of children: Not on file   Years of education: Not on file   Highest education level: Not on file  Occupational History   Not on file  Social Needs   Financial resource strain: Not hard at all   Food insecurity:    Worry: Patient refused    Inability: Patient refused   Transportation needs:    Medical: Patient refused    Non-medical: Patient refused  Tobacco Use   Smoking status: Former Smoker    Packs/day: 2.00    Years: 40.00    Pack years: 80.00    Types: Cigarettes    Last attempt to quit: 11/27/2001    Years since quitting: 17.3   Smokeless tobacco: Never Used  Substance and Sexual Activity   Alcohol use: Yes    Comment: 12/05/2017 "once in a great while I might have a drink"   Drug use: No   Sexual activity: Not Currently  Lifestyle   Physical activity:    Days per week: 1 day    Minutes per session: 30 min   Stress: Only a little  Relationships   Social connections:    Talks on phone: Patient refused    Gets together: Patient refused    Attends religious service: Patient refused    Active member of club or organization: Patient refused    Attends meetings of clubs or organizations: Patient refused    Relationship status: Patient refused   Intimate partner violence:    Fear of current or ex partner: Patient refused    Emotionally abused: Patient refused    Physically abused: Patient refused    Forced sexual activity: Patient refused  Other Topics Concern   Not on file  Social History Narrative   [facility] (02/19/17):   Patient is originally from [facility]. She has also lived in [place]. She has worked primarily Veterinary surgeon. Has a dog currently. No  bird or mold exposure.     REVIEW OF SYSTEMS: Constitutional:  Weakness.   ENT:  No  nose bleeds Pulm: Denies shortness of breath.  Some coughing. CV:  No palpitations, no LE edema.  No chest pain GU:  No hematuria, no frequency GI: HPI. Heme: No unusual bleeding or bruising. Transfusions: 1 unit PRBCs in 11/2018. 3 U PRBCs in 11/2009 Neuro:  No headaches, no peripheral tingling or numbness Derm:  No itching, no rash or sores.  Endocrine:  No sweats or chills.  No polyuria or dysuria Immunization:  reviewed Travel:  None beyond local counties in last few months.    PHYSICAL EXAM: Vital signs in last 24 hours: Vitals:   03/25/19 0908 03/25/19 0912  BP: (!) 103/49   Pulse: 77 77  Resp: 19 17  Temp:    SpO2: 100% 100%   Wt Readings from Last 3 Encounters:  02/14/19 110.2 kg  12/08/18 133.4 kg  10/02/18 120.4 kg    General: Fretful, obese, chronically ill looking WF Head: No facial asymmetry or swelling.  No signs of head trauma. Eyes: No scleral icterus.  No conjunctival pallor.  EOMI Ears: Not hard of hearing Nose: No discharge or congestion. Mouth: Good dental repair.  Oral mucosa pink, moist, clear.  Tongue midline. Neck: No JVD, no thyromegaly. Lungs: Diminished but clear bilaterally.  No labored breathing.  No cough.  The cath positioned in the upper left chest Heart: RRR.  No MRG.  S1, S2 present. Abdomen: Soft.  Not tender, not distended.  Obese.  Active bowel sounds.  No hernias, bruits, organomegaly..   Rectal: Did not perform. Musc/Skeltl: No gross joint deformity.  Residual bruising versus hematin staining beneath the skin around the left ankle. Extremities: Pitting edema in the legs below the knee.  Patchy erythema and skin beneath the pressure stockings. Neurologic: Alert.  Oriented to place, self, year.  Moves all 4 limbs, strength not tested.  Intentional tremor in the hands/arms. Skin: Erythema on the lower legs. Tattoos: None. Nodes: No cervical adenopathy Psych: Fretful, somewhat anxious.  Cooperative  Intake/Output from previous  day: 04/27 0701 - 04/28 0700 In: 676.3 [I.V.:52.8; Blood:340; IV Piggyback:283.6] Out: -  Intake/Output this shift: No intake/output data recorded.  LAB RESULTS: Recent Labs    03/24/19 2151  WBC 6.8  HGB 4.7*  HCT 15.0*  PLT 150   BMET Lab Results  Component Value Date   NA 137 03/24/2019   NA 136 02/16/2019   NA 136 02/15/2019   K 2.9 (L) 03/24/2019   K 4.0 02/16/2019   K 3.7 02/15/2019   CL 100 03/24/2019   CL 102 02/16/2019   CL 103 02/15/2019   CO2 32 03/24/2019   CO2 27 02/16/2019   CO2 27 02/15/2019   GLUCOSE 119 (H) 03/24/2019   GLUCOSE 94 02/16/2019   GLUCOSE 91 02/15/2019   BUN 59 (H) 03/24/2019   BUN 24 (H) 02/16/2019   BUN 29 (H) 02/15/2019   CREATININE 2.85 (H) 03/24/2019   CREATININE 1.92 (H) 02/16/2019   CREATININE 2.26 (H) 02/15/2019   CALCIUM 10.9 (H) 03/24/2019   CALCIUM 10.8 (H) 02/16/2019   CALCIUM 10.1 02/15/2019   LFT Recent Labs    03/24/19 2151  PROT 4.9*  ALBUMIN 2.4*  AST 14*  ALT 10  ALKPHOS 26*  BILITOT 0.6   PT/INR Lab Results  Component Value Date   INR 1.9 (H) 03/24/2019   INR 1.8 (H) 02/13/2019   INR 1.44 11/29/2018  Hepatitis Panel No results for input(s): HEPBSAG, HCVAB, HEPAIGM, HEPBIGM in the last 72 hours. C-Diff No components found for: CDIFF Lipase  No results found for: LIPASE  Drugs of Abuse  No results found for: LABOPIA, COCAINSCRNUR, LABBENZ, AMPHETMU, THCU, LABBARB   RADIOLOGY STUDIES: Dg Chest Port 1 View  Result Date: 03/25/2019 CLINICAL DATA:  Bibasilar crackles. Edema. Congestive heart failure. Right lung cancer. EXAM: PORTABLE CHEST 1 VIEW COMPARISON:  Two-view chest x-ray 02/13/2019 FINDINGS: A left subclavian Port-A-Cath is stable. Chronic volume loss is present at the right base. New right basilar airspace disease is present. The left lung is clear. Lung volumes are low. IMPRESSION: 1. New right basilar airspace disease superimposed on chronic volume loss. Findings are concerning for  infection/pneumonia. 2. The left lung is clear. 3. Stable Port-A-Cath. Electronically Signed   By: San Morelle M.D.   On: 03/25/2019 05:29      IMPRESSION:   *   Acute on chronic anemia.  Macrocytic.  On oral iron and oral B12.   Nausea, nonbloody emesis.  Dark, melenic stool in the ED, do not see Hemoccult results in the labs., Mild reflux esophagitis on EGD and multiple colon polyps, adenomatous and hyperplastic on colonoscopy in 06/2018. Takes ranitidine 150 mg daily.   Transfusing with her third unit of blood now.  Repeat Hgb after that.  *    Chronic Eliquis for A. fib.  Presume her last dose was 03/24/2019.  *    Hx NSC lung cancer.  Has been treated with chemoradiation and then further chemo.  She is due for repeat CT scan this month according to last office visit note from [name]. CXR suggest right sided PNA.  No Abx in place.    PLAN:     *    Probably will need upper endoscopy, the soonest we would do this would be tomorrow but it might be in 2 days given she likely took Eliquis yesterday.   Okay to have clear liquids now.  *   She is currently on Protonix drip.  I am not going to change this but she might be able to switch to Protonix 40 mg IV BID.      Azucena Freed  03/25/2019, 10:25 AM Phone [phone]   Attending physician's note   I have taken a history, examined the patient and reviewed the chart. I agree with the Advanced Practitioner's note, impression and recommendations.  78 year old female with multiple comorbidities, on chronic Eliquis for A. fib admitted with worsening anemia and melena Hemoglobin 4.7 on admission, significantly lower compared to labs in March with hemoglobin 10.2.  Reviewed recent GI work-up and labs.  Clear liquids Transfuse PRBC to hemoglobin greater than 7 PPI IV Last dose of Eliquis 03/24/2019 We will plan for EGD tomorrow N.p.o. after midnight  K. Denzil Magnuson , MD [phone]

## 2019-03-25 NOTE — Progress Notes (Addendum)
Pt arrived to [facility]. Alert and oriented x 4, lethargic at the time of admission. Pt identified appropriately, VS stable, no signs of acute distress.  Cardiac monitor in place and CCMD notified. Pt on progressive care monitor M05. Oriented to room and equipment, instructed on how to use call bell and pt verbalized understanding. Call bell within reach.  Unable to complete orthostatic VS, pt not able to stand up, wheelchair bound.  Will continue to monitor and treat pt per MD orders.

## 2019-03-25 NOTE — Progress Notes (Signed)
Pt BP 87/41, HR 80. Diane Drought, MD aware.

## 2019-03-26 ENCOUNTER — Encounter (HOSPITAL_COMMUNITY): Payer: Self-pay | Admitting: *Deleted

## 2019-03-26 ENCOUNTER — Inpatient Hospital Stay (HOSPITAL_COMMUNITY): Payer: Medicare Other | Admitting: Anesthesiology

## 2019-03-26 ENCOUNTER — Encounter (HOSPITAL_COMMUNITY)
Admission: EM | Disposition: A | Discharge status: Skilled Nursing Facility | Payer: Self-pay | Source: Home / Self Care | Attending: Student

## 2019-03-26 DIAGNOSIS — K2990 Gastroduodenitis, unspecified, without bleeding: Secondary | ICD-10-CM

## 2019-03-26 DIAGNOSIS — K2970 Gastritis, unspecified, without bleeding: Secondary | ICD-10-CM | POA: Diagnosis present

## 2019-03-26 HISTORY — PX: BIOPSY: SHX5522

## 2019-03-26 HISTORY — PX: ESOPHAGOGASTRODUODENOSCOPY (EGD) WITH PROPOFOL: SHX5813

## 2019-03-26 LAB — BPAM RBC
Blood Product Expiration Date: 202005042359
Blood Product Expiration Date: 202005042359
Blood Product Expiration Date: 202005052359
ISSUE DATE / TIME: 202004280119
ISSUE DATE / TIME: 202004280430
ISSUE DATE / TIME: 202004280901
Unit Type and Rh: 600
Unit Type and Rh: 600
Unit Type and Rh: 6200

## 2019-03-26 LAB — TYPE AND SCREEN
ABO/RH(D): A POS
Antibody Screen: NEGATIVE
Unit division: 0
Unit division: 0
Unit division: 0

## 2019-03-26 LAB — BASIC METABOLIC PANEL
Anion gap: 8 (ref 5–15)
BUN: 48 mg/dL — ABNORMAL HIGH (ref 8–23)
CO2: 28 mmol/L (ref 22–32)
Calcium: 10.5 mg/dL — ABNORMAL HIGH (ref 8.9–10.3)
Chloride: 103 mmol/L (ref 98–111)
Creatinine, Ser: 2.44 mg/dL — ABNORMAL HIGH (ref 0.44–1.00)
GFR calc Af Amer: 21 mL/min — ABNORMAL LOW (ref >60)
GFR calc non Af Amer: 18 mL/min — ABNORMAL LOW (ref >60)
Glucose, Bld: 83 mg/dL (ref 70–99)
Potassium: 3.3 mmol/L — ABNORMAL LOW (ref 3.5–5.1)
Sodium: 139 mmol/L (ref 135–145)

## 2019-03-26 LAB — CBC
HCT: 22.9 % — ABNORMAL LOW (ref 36.0–46.0)
Hemoglobin: 7.5 g/dL — ABNORMAL LOW (ref 12.0–15.0)
MCH: 31.4 pg (ref 26.0–34.0)
MCHC: 32.8 g/dL (ref 30.0–36.0)
MCV: 95.8 fL (ref 80.0–100.0)
Platelets: 120 10*3/uL — ABNORMAL LOW (ref 150–400)
RBC: 2.39 MIL/uL — ABNORMAL LOW (ref 3.87–5.11)
RDW: 18 % — ABNORMAL HIGH (ref 11.5–15.5)
WBC: 5.8 10*3/uL (ref 4.0–10.5)
nRBC: 0.3 % — ABNORMAL HIGH (ref 0.0–0.2)

## 2019-03-26 SURGERY — ESOPHAGOGASTRODUODENOSCOPY (EGD) WITH PROPOFOL
Anesthesia: Monitor Anesthesia Care

## 2019-03-26 MED ORDER — SODIUM CHLORIDE 0.9 % IV SOLN
INTRAVENOUS | Status: DC
  Administered 2019-03-26: 14:00:00 via INTRAVENOUS

## 2019-03-26 MED ORDER — SUCCINYLCHOLINE CHLORIDE 20 MG/ML IJ SOLN
INTRAMUSCULAR | Status: DC | PRN
  Administered 2019-03-26: 120 mg via INTRAVENOUS

## 2019-03-26 MED ORDER — FENTANYL CITRATE (PF) 100 MCG/2ML IJ SOLN
INTRAMUSCULAR | Status: DC | PRN
  Administered 2019-03-26: 100 ug via INTRAVENOUS

## 2019-03-26 MED ORDER — DEXAMETHASONE SODIUM PHOSPHATE 10 MG/ML IJ SOLN
INTRAMUSCULAR | Status: DC | PRN
  Administered 2019-03-26: 5 mg via INTRAVENOUS

## 2019-03-26 MED ORDER — TORSEMIDE 20 MG PO TABS
20.0000 mg | ORAL_TABLET | Freq: Every day | ORAL | Status: DC
  Administered 2019-03-26 – 2019-03-28 (×3): 20 mg via ORAL
  Filled 2019-03-26 (×3): qty 1

## 2019-03-26 MED ORDER — LIDOCAINE HCL (CARDIAC) PF 100 MG/5ML IV SOSY
PREFILLED_SYRINGE | INTRAVENOUS | Status: DC | PRN
  Administered 2019-03-26: 100 mg via INTRAVENOUS

## 2019-03-26 MED ORDER — POTASSIUM CHLORIDE 10 MEQ/100ML IV SOLN
10.0000 meq | INTRAVENOUS | Status: AC
  Administered 2019-03-26 (×4): 10 meq via INTRAVENOUS
  Filled 2019-03-26 (×4): qty 100

## 2019-03-26 MED ORDER — LACTATED RINGERS IV SOLN: INTRAVENOUS | Status: DC | PRN

## 2019-03-26 MED ORDER — ONDANSETRON HCL 4 MG/2ML IJ SOLN
INTRAMUSCULAR | Status: DC | PRN
  Administered 2019-03-26: 4 mg via INTRAVENOUS

## 2019-03-26 MED ORDER — PANTOPRAZOLE SODIUM 40 MG PO TBEC
40.0000 mg | DELAYED_RELEASE_TABLET | Freq: Two times a day (BID) | ORAL | Status: DC
  Administered 2019-03-26 – 2019-03-30 (×8): 40 mg via ORAL
  Filled 2019-03-26 (×8): qty 1

## 2019-03-26 MED ORDER — PROPOFOL 10 MG/ML IV BOLUS
INTRAVENOUS | Status: DC | PRN
  Administered 2019-03-26: 150 mg via INTRAVENOUS

## 2019-03-26 SURGICAL SUPPLY — 15 items

## 2019-03-26 NOTE — Anesthesia Postprocedure Evaluation (Signed)
Anesthesia Post Note  Patient: Diane [name] [facility]  Procedure(s) Performed: ESOPHAGOGASTRODUODENOSCOPY (EGD) WITH PROPOFOL (N/A ) BIOPSY     Patient location during evaluation: PACU Anesthesia Type: General Level of consciousness: awake and alert Pain management: pain level controlled Vital Signs Assessment: post-procedure vital signs reviewed and stable Respiratory status: spontaneous breathing, nonlabored ventilation, respiratory function stable and patient connected to nasal cannula oxygen Cardiovascular status: blood pressure returned to baseline and stable Postop Assessment: no apparent nausea or vomiting Anesthetic complications: no    Last Vitals:  Vitals:   03/26/19 1415 03/26/19 1430  BP: (!) 121/56 (!) 135/35  Pulse:    Resp: (!) 29 (!) 23  Temp: 36.8 C 36.7 C  SpO2: 95% 94%    Last Pain:  Vitals:   03/26/19 1430  TempSrc: Oral  PainSc: 0-No pain                 Audry Pili

## 2019-03-26 NOTE — Anesthesia Preprocedure Evaluation (Signed)
Anesthesia Evaluation  Patient identified by MRN, date of birth, ID band Patient awake    Reviewed: Allergy & Precautions, NPO status , Patient's Chart, lab work & pertinent test results  Airway Mallampati: II  TM Distance: >3 FB Neck ROM: Full    Dental  (+) Dental Advisory Given   Pulmonary COPD,  oxygen dependent, former smoker,    breath sounds clear to auscultation       Cardiovascular hypertension, Pt. on medications + CAD and +CHF  + Valvular Problems/Murmurs  Rhythm:Regular Rate:Normal     Neuro/Psych negative neurological ROS     GI/Hepatic Neg liver ROS, GERD  ,  Endo/Other  negative endocrine ROS  Renal/GU Renal disease     Musculoskeletal   Abdominal   Peds  Hematology  (+) anemia ,   Anesthesia Other Findings   Reproductive/Obstetrics                             Lab Results  Component Value Date   WBC 5.8 03/26/2019   HGB 7.5 (L) 03/26/2019   HCT 22.9 (L) 03/26/2019   MCV 95.8 03/26/2019   PLT 120 (L) 03/26/2019   Lab Results  Component Value Date   CREATININE 2.44 (H) 03/26/2019   BUN 48 (H) 03/26/2019   NA 139 03/26/2019   K 3.3 (L) 03/26/2019   CL 103 03/26/2019   CO2 28 03/26/2019    Anesthesia Physical  Anesthesia Plan  ASA: IV  Anesthesia Plan: MAC   Post-op Pain Management:    Induction: Intravenous  PONV Risk Score and Plan: 2 and Ondansetron, Treatment may vary due to age or medical condition and Midazolam  Airway Management Planned: Nasal Cannula  Additional Equipment:   Intra-op Plan:   Post-operative Plan:   Informed Consent: I have reviewed the patients History and Physical, chart, labs and discussed the procedure including the risks, benefits and alternatives for the proposed anesthesia with the patient or authorized representative who has indicated his/her understanding and acceptance.     Dental advisory given  Plan Discussed  with: CRNA  Anesthesia Plan Comments:         Anesthesia Quick Evaluation

## 2019-03-26 NOTE — Transfer of Care (Signed)
Immediate Anesthesia Transfer of Care Note  Patient: Diane [name] [facility]  Procedure(s) Performed: ESOPHAGOGASTRODUODENOSCOPY (EGD) WITH PROPOFOL (N/A ) BIOPSY  Patient Location: Endoscopy Unit  Anesthesia Type:General  Level of Consciousness: drowsy  Airway & Oxygen Therapy: Patient Spontanous Breathing and Patient connected to nasal cannula oxygen  Post-op Assessment: Report given to RN, Post -op Vital signs reviewed and stable and Patient moving all extremities  Post vital signs: Reviewed and stable  Last Vitals:  Vitals Value Taken Time  BP    Temp    Pulse    Resp    SpO2      Last Pain:  Vitals:   03/26/19 1220  TempSrc: Oral  PainSc: 0-No pain         Complications: No apparent anesthesia complications

## 2019-03-26 NOTE — Op Note (Addendum)
[facility] Patient Name: Diane [name] Procedure Date : 03/26/2019 MRN: [id number] Attending MD: Mauri Pole , MD Date of Birth: [birth year] CSN: [id number] Age: 78 Admit Type: Inpatient Procedure:                Upper GI endoscopy Indications:              Recent gastrointestinal bleeding Providers:                Mauri Pole, MD, Carlyn Reichert, RN, Elspeth Cho Tech., Technician, Raphael Gibney, CRNA Referring MD:              Medicines:                Monitored Anesthesia Care Complications:            No immediate complications. Estimated Blood Loss:     Estimated blood loss was minimal. Procedure:                Pre-Anesthesia Assessment:                           - Prior to the procedure, a History and Physical                            was performed, and patient medications and                            allergies were reviewed. The patient's tolerance of                            previous anesthesia was also reviewed. The risks                            and benefits of the procedure and the sedation                            options and risks were discussed with the patient.                            All questions were answered, and informed consent                            was obtained. Prior Anticoagulants: The patient                            last took Eliquis (apixaban) 2 days prior to the                            procedure. ASA Grade Assessment: IV - A patient                            with severe systemic disease that is a constant  threat to life. After reviewing the risks and                            benefits, the patient was deemed in satisfactory                            condition to undergo the procedure.                           After obtaining informed consent, the endoscope was                            passed under direct vision. Throughout the             procedure, the patient's blood pressure, pulse, and                            oxygen saturations were monitored continuously. The                            GIF-H190 ([id number]) Olympus gastroscope was                            introduced through the mouth, and advanced to the                            second part of duodenum. The upper GI endoscopy was                            accomplished without difficulty. The patient                            tolerated the procedure well. Scope In: Scope Out: Findings:      The Z-line was regular and was found 38 cm from the incisors.      No gross lesions were noted in the entire esophagus.      Patchy moderate inflammation characterized by congestion (edema),       erosions, erythema and friability was found in the gastric antrum.       Biopsies were taken with a cold forceps for Helicobacter pylori testing.      A few diffuse, less than 5 mm erosions with bleeding on contact and       small amount of heme with clots were found in the duodenal bulb and in       the second portion of the duodenum. Patchy nodularity in the duodenum       Biopsies were taken with a cold forceps for histology. Impression:               - Z-line regular, 38 cm from the incisors.                           - No gross lesions in esophagus.                           - Gastritis. Biopsied.                           -  Erosive duodenopathy with bleeding on contact.                            Biopsied. Recommendation:           - Resume previous diet.                           - Continue present medications.                           - Await pathology results.                           - Follow up H.pylori status                           - Use Protonix (pantoprazole) 40 mg PO BID.                           - Resume Eliquis (apixaban) at prior dose in 2                            days. Refer to managing physician for further                             adjustment of therapy. Procedure Code(s):        --- Professional ---                           [phone], Esophagogastroduodenoscopy, flexible,                            transoral; with biopsy, single or multiple Diagnosis Code(s):        --- Professional ---                           K29.70, Gastritis, unspecified, without bleeding                           K92.2, Gastrointestinal hemorrhage, unspecified CPT copyright 2019 [employer] Association. All rights reserved. The codes documented in this report are preliminary and upon coder review may  be revised to meet current compliance requirements. Mauri Pole, MD 03/26/2019 2:13:21 PM This report has been signed electronically. Number of Addenda: 0

## 2019-03-26 NOTE — Interval H&P Note (Signed)
History and Physical Interval Note:  03/26/2019 1:32 PM  Diane [name]  has presented today for surgery, with the diagnosis of Melena, GI bleed.  The various methods of treatment have been discussed with the patient and family. After consideration of risks, benefits and other options for treatment, the patient has consented to  Procedure(s): ESOPHAGOGASTRODUODENOSCOPY (EGD) WITH PROPOFOL (N/A) as a surgical intervention.  The patient's history has been reviewed, patient examined, no change in status, stable for surgery.  I have reviewed the patient's chart and labs.  Questions were answered to the patient's satisfaction.     Zohar Maroney

## 2019-03-26 NOTE — Anesthesia Procedure Notes (Signed)
Procedure Name: Intubation Date/Time: 03/26/2019 1:40 PM Performed by: Yury Schaus T, CRNA Pre-anesthesia Checklist: Patient identified, Emergency Drugs available and Suction available Patient Re-evaluated:Patient Re-evaluated prior to induction Oxygen Delivery Method: Circle system utilized Preoxygenation: Pre-oxygenation with 100% oxygen Induction Type: IV induction, Rapid sequence and Cricoid Pressure applied Laryngoscope Size: Miller and 3 Grade View: Grade I Tube type: Oral Tube size: 7.5 mm Number of attempts: 1 Airway Equipment and Method: Patient positioned with wedge pillow and Stylet Placement Confirmation: ETT inserted through vocal cords under direct vision,  positive ETCO2 and breath sounds checked- equal and bilateral Secured at: 22 cm Tube secured with: Tape Dental Injury: Teeth and Oropharynx as per pre-operative assessment

## 2019-03-26 NOTE — Progress Notes (Signed)
PROGRESS NOTE  Diane [name] [facility] ACZ:[id number] DOB: 26-Nov-1941 DOA: 03/24/2019 PCP: [name], MD   LOS: 2 days   Patient is from: home  Brief Narrative / Interim history: 78 year old female with history of NSCLC currently in remission, diastolic CHF, hypertension and A. fib on Eliquis presenting with low hemoglobin in the setting of melanotic bowel movements.  Globin 4.7 on admission.  Received 3 units of blood and hemoglobin trended up to 8.1 and settled at 7.5   Subjective: No major events overnight.  Complains generalized body pain.  Denies chest pain or dyspnea.   Assessment & Plan: Principal Problem:   Melena Active Problems:   Chronic diastolic heart failure (HCC)   Persistent atrial fibrillation; CHA2DS2Vacs = 6; Eliquis; Amiodarone for rhythm control   Kidney disease, chronic, stage III (GFR 30-59 ml/min) (HCC)   Hypertensive heart and chronic kidney disease with heart failure and stage 1 through stage 4 chronic kidney disease, or chronic kidney disease (HCC)   COPD (chronic obstructive pulmonary disease) (HCC)   AKI (acute kidney injury) ([place])   Acute blood loss anemia  Acute blood loss anemia due to melanotic GI bleed/gastritis -Hemoglobin 4.7 on arrival.  Transfused 3 units with appropriate response.  Stable. -Monitor H&H -EGD on 4/29 revealed gastritis-biopsy taken -Appreciate GI recommendations:   - Resume previous diet.  - Continue present medications.  - Await pathology results.  - Follow up H.pylori status  - Use Protonix (pantoprazole) 40 mg PO BID.  - Resume Eliquis (apixaban) at prior dose in 2 days (4/30). -PT/OT eval  Persistent atrial fibrillation: -Eliquis on hold -Continue amiodarone -Continue holding Coreg  Chronic diastolic CHF: edematous in all extremities -Resume home Demadex -Daily weight, intake output and renal function  AKI on CKD 3: presumably prerenal in the setting of hypotension.  Also on Demadex.  Baseline creatinine  1.6.  Improving. -Continue monitoring -Resume home Demadex  COPD: Stable -Continue home medications-Anoro Ellipta and PRN albuterol  Hypokalemia -Replenish and recheck   Anxiety: -Resume home BuSpar  Morbid obesity: BMI 39.68 -Currently poor p.o. intake due to GI bleed and n.p.o. -Feeding supplement  Scheduled Meds: . [MAR Hold] amiodarone  200 mg Oral Daily  . [MAR Hold] feeding supplement  1 Container Oral TID BM  . [MAR Hold] mouth rinse  15 mL Mouth Rinse BID  . [MAR Hold] pantoprazole  40 mg Intravenous Q12H  . [MAR Hold] umeclidinium-vilanterol  1 puff Inhalation Daily   Continuous Infusions: . sodium chloride    . pantoprozole (PROTONIX) infusion 8 mg/hr (03/26/19 1141)   PRN Meds:.[MAR Hold] acetaminophen **OR** [MAR Hold] acetaminophen, [MAR Hold] albuterol, [MAR Hold] Melatonin, [MAR Hold] ondansetron **OR** [MAR Hold] ondansetron (ZOFRAN) IV, [MAR Hold] oxyCODONE, [MAR Hold] sodium chloride flush   DVT prophylaxis: SCD in the setting of bleeding Code Status: Full code Family Communication: None at bedside Disposition Plan: Remains inpatient for GI bleed  Consultants:   Gastroenterology  Procedures:   EGD on 4/29  Microbiology: . None  Antimicrobials:  None  Objective: Vitals:   03/26/19 0820 03/26/19 1220 03/26/19 1415 03/26/19 1430  BP:  (!) 145/63 (!) 121/56 (!) 135/35  Pulse: 84 80    Resp: (!) 23 12 (!) 29 (!) 23  Temp:  98.1 F (36.7 C) 98.2 F (36.8 C)   TempSrc:  Oral Oral   SpO2: 98% 100% 95% 94%  Weight:      Height:        Intake/Output Summary (Last 24 hours) at 03/26/2019  1436 Last data filed at 03/26/2019 1423 Gross per 24 hour  Intake 1238.52 ml  Output 525 ml  Net 713.52 ml   Filed Weights   03/26/19 0648  Weight: 111.5 kg    Examination:  GENERAL: No acute distress.  Appears well.  HEENT: MMM.  Vision and hearing grossly intact.  NECK: Supple.  No JVD.  LUNGS:  No IWOB.  Fair air movement bilaterally. HEART:   RRR. Heart sounds normal.  ABD: Bowel sounds present. Soft.  Diffuse tenderness MSK/EXT:  Moves all extremities. No apparent deformity.  Edema in all extremities SKIN: no apparent skin lesion or wound NEURO: Awake, alert and oriented appropriately.  No gross deficit.  PSYCH: Calm. Normal affect.   Port a cath on left chest  Data Reviewed: I have independently reviewed following labs and imaging studies  CBC: Recent Labs  Lab 03/24/19 2151 03/25/19 1438 03/26/19 0403  WBC 6.8 7.4 5.8  NEUTROABS 5.3  --   --   HGB 4.7* 8.1* 7.5*  HCT 15.0* 24.5* 22.9*  MCV 104.9* 93.2 95.8  PLT 150 134* 300*   Basic Metabolic Panel: Recent Labs  Lab 03/24/19 2151 03/25/19 1434 03/26/19 0403  NA 137 140 139  K 2.9* 3.7 3.3*  CL 100 103 103  CO2 32 29 28  GLUCOSE 119* 96 83  BUN 59* 54* 48*  CREATININE 2.85* 2.66* 2.44*  CALCIUM 10.9* 10.7* 10.5*   GFR: Estimated Creatinine Clearance: 24.1 mL/min (A) (by C-G formula based on SCr of 2.44 mg/dL (H)). Liver Function Tests: Recent Labs  Lab 03/24/19 2151  AST 14*  ALT 10  ALKPHOS 26*  BILITOT 0.6  PROT 4.9*  ALBUMIN 2.4*   No results for input(s): LIPASE, AMYLASE in the last 168 hours. No results for input(s): AMMONIA in the last 168 hours. Coagulation Profile: Recent Labs  Lab 03/24/19 2150  INR 1.9*   Cardiac Enzymes: No results for input(s): CKTOTAL, CKMB, CKMBINDEX, TROPONINI in the last 168 hours. BNP (last 3 results) No results for input(s): PROBNP in the last 8760 hours. HbA1C: No results for input(s): HGBA1C in the last 72 hours. CBG: Recent Labs  Lab 03/25/19 0138  GLUCAP 104*   Lipid Profile: No results for input(s): CHOL, HDL, LDLCALC, TRIG, CHOLHDL, LDLDIRECT in the last 72 hours. Thyroid Function Tests: No results for input(s): TSH, T4TOTAL, FREET4, T3FREE, THYROIDAB in the last 72 hours. Anemia Panel: No results for input(s): VITAMINB12, FOLATE, FERRITIN, TIBC, IRON, RETICCTPCT in the last 72 hours.  Urine analysis:    Component Value Date/Time   COLORURINE YELLOW 03/25/2019 0646   APPEARANCEUR CLEAR 03/25/2019 0646   LABSPEC 1.010 03/25/2019 0646   PHURINE 5.0 03/25/2019 0646   GLUCOSEU NEGATIVE 03/25/2019 0646   HGBUR SMALL (A) 03/25/2019 0646   BILIRUBINUR NEGATIVE 03/25/2019 0646   KETONESUR NEGATIVE 03/25/2019 0646   PROTEINUR NEGATIVE 03/25/2019 0646   UROBILINOGEN 1.0 12/05/2009 1303   NITRITE NEGATIVE 03/25/2019 0646   LEUKOCYTESUR NEGATIVE 03/25/2019 0646   Sepsis Labs: Invalid input(s): PROCALCITONIN, LACTICIDVEN  Recent Results (from the past 240 hour(s))  MRSA PCR Screening     Status: None   Collection Time: 03/25/19  2:38 AM  Result Value Ref Range Status   MRSA by PCR NEGATIVE NEGATIVE Final    Comment:        The GeneXpert MRSA Assay (FDA approved for NASAL specimens only), is one component of a comprehensive MRSA colonization surveillance program. It is not intended to diagnose MRSA infection nor to  guide or monitor treatment for MRSA infections. Performed at [facility], [place] [address], [place], [place] [address]       Radiology Studies: No results found.   Alicen Donalson T. [facility] Pager [phone]  If 7PM-7AM, please contact night-coverage [email or web address] Password [facility] 03/26/2019, 2:36 PM

## 2019-03-27 DIAGNOSIS — K2970 Gastritis, unspecified, without bleeding: Secondary | ICD-10-CM

## 2019-03-27 DIAGNOSIS — J439 Emphysema, unspecified: Secondary | ICD-10-CM

## 2019-03-27 DIAGNOSIS — K2990 Gastroduodenitis, unspecified, without bleeding: Secondary | ICD-10-CM

## 2019-03-27 LAB — BASIC METABOLIC PANEL
Anion gap: 5 (ref 5–15)
BUN: 39 mg/dL — ABNORMAL HIGH (ref 8–23)
CO2: 28 mmol/L (ref 22–32)
Calcium: 10.3 mg/dL (ref 8.9–10.3)
Chloride: 105 mmol/L (ref 98–111)
Creatinine, Ser: 2.09 mg/dL — ABNORMAL HIGH (ref 0.44–1.00)
GFR calc Af Amer: 26 mL/min — ABNORMAL LOW (ref >60)
GFR calc non Af Amer: 22 mL/min — ABNORMAL LOW (ref >60)
Glucose, Bld: 110 mg/dL — ABNORMAL HIGH (ref 70–99)
Potassium: 4.5 mmol/L (ref 3.5–5.1)
Sodium: 138 mmol/L (ref 135–145)

## 2019-03-27 LAB — CBC
HCT: 22.6 % — ABNORMAL LOW (ref 36.0–46.0)
Hemoglobin: 7.3 g/dL — ABNORMAL LOW (ref 12.0–15.0)
MCH: 32 pg (ref 26.0–34.0)
MCHC: 32.3 g/dL (ref 30.0–36.0)
MCV: 99.1 fL (ref 80.0–100.0)
Platelets: 136 10*3/uL — ABNORMAL LOW (ref 150–400)
RBC: 2.28 MIL/uL — ABNORMAL LOW (ref 3.87–5.11)
RDW: 17.2 % — ABNORMAL HIGH (ref 11.5–15.5)
WBC: 4.6 10*3/uL (ref 4.0–10.5)
nRBC: 0 % (ref 0.0–0.2)

## 2019-03-27 LAB — RETICULOCYTES
Immature Retic Fract: 25.6 % — ABNORMAL HIGH (ref 2.3–15.9)
RBC.: 2.4 MIL/uL — ABNORMAL LOW (ref 3.87–5.11)
Retic Count, Absolute: 101.3 10*3/uL (ref 19.0–186.0)
Retic Ct Pct: 4.2 % — ABNORMAL HIGH (ref 0.4–3.1)

## 2019-03-27 LAB — FERRITIN: Ferritin: 434 ng/mL — ABNORMAL HIGH (ref 11–307)

## 2019-03-27 LAB — IRON AND TIBC
Iron: 23 ug/dL — ABNORMAL LOW (ref 28–170)
Saturation Ratios: 12 % (ref 10.4–31.8)
TIBC: 197 ug/dL — ABNORMAL LOW (ref 250–450)
UIBC: 174 ug/dL

## 2019-03-27 LAB — FOLATE: Folate: 10.2 ng/mL (ref >5.9)

## 2019-03-27 LAB — VITAMIN B12: Vitamin B-12: 664 pg/mL (ref 180–914)

## 2019-03-27 MED ORDER — POLYETHYLENE GLYCOL 3350 17 G PO PACK
17.0000 g | PACK | Freq: Every day | ORAL | Status: DC
  Administered 2019-03-27 – 2019-03-30 (×4): 17 g via ORAL
  Filled 2019-03-27 (×4): qty 1

## 2019-03-27 MED ORDER — BISACODYL 10 MG RE SUPP
10.0000 mg | Freq: Every day | RECTAL | Status: DC | PRN
  Administered 2019-03-28: 10 mg via RECTAL
  Filled 2019-03-27: qty 1

## 2019-03-27 NOTE — Progress Notes (Addendum)
Daily Rounding Note  03/27/2019, 12:30 PM  LOS: 3 days   SUBJECTIVE:   Chief complaint: Gastritis, duodenitis.  Blood loss anemia, chronic anemia.    GI wise she feels well other than complaining of bloating.  Has not had a bowel movement for a few days, normally takes MiraLAX daily at home. Fretful and anxious because she does not have her phone or her eyeglasses which were apparently left at the SNF she came from.  She wants to know when she is going back to SNF but at the same time is worried about her husband not being able to change her diapers.  OBJECTIVE:         Vital signs in last 24 hours:    Temp:  [97.8 F (36.6 C)-98.7 F (37.1 C)] 98 F (36.7 C) (04/30 0807) Pulse Rate:  [76-89] 82 (04/30 0940) Resp:  [16-31] 18 (04/30 0940) BP: (108-155)/(35-99) 155/56 (04/30 0940) SpO2:  [94 %-100 %] 99 % (04/30 0940) Last BM Date: 03/24/19 Filed Weights   03/26/19 0648  Weight: 111.5 kg   General: Obese, anxious, pale, comfortable.  Looks chronically ill. Heart: RRR. Chest: No labored breathing or cough Abdomen: Soft.  Not tender or distended.  Active bowel sounds. Extremities: Lower extremity edema. Neuro/Psych: Anxious, somewhat fretful.  Oriented x3.  Alert, not somnolent.  Moves all 4 limbs.  Intake/Output from previous day: 04/29 0701 - 04/30 0700 In: 400 [I.V.:400] Out: 1650 [Urine:1650]  Intake/Output this shift: No intake/output data recorded.  Lab Results: Recent Labs    03/25/19 1438 03/26/19 0403 03/27/19 0430  WBC 7.4 5.8 4.6  HGB 8.1* 7.5* 7.3*  HCT 24.5* 22.9* 22.6*  PLT 134* 120* 136*   BMET Recent Labs    03/25/19 1434 03/26/19 0403 03/27/19 0430  NA 140 139 138  K 3.7 3.3* 4.5  CL 103 103 105  CO2 29 28 28   GLUCOSE 96 83 110*  BUN 54* 48* 39*  CREATININE 2.66* 2.44* 2.09*  CALCIUM 10.7* 10.5* 10.3   LFT Recent Labs    03/24/19 2151  PROT 4.9*  ALBUMIN 2.4*  AST 14*   ALT 10  ALKPHOS 26*  BILITOT 0.6   PT/INR Recent Labs    03/24/19 2150  LABPROT 21.8*  INR 1.9*   Scheduled Meds: . amiodarone  200 mg Oral Daily  . feeding supplement  1 Container Oral TID BM  . mouth rinse  15 mL Mouth Rinse BID  . pantoprazole  40 mg Oral BID  . torsemide  20 mg Oral Daily  . umeclidinium-vilanterol  1 puff Inhalation Daily   Continuous Infusions: PRN Meds:.acetaminophen **OR** acetaminophen, albuterol, Melatonin, ondansetron **OR** ondansetron (ZOFRAN) IV, oxyCODONE, sodium chloride flush    ASSESMENT:   *     Acute blood loss on top of chronic anemia.  Macrocytic.  Nonbloody emesis.  Chronic oral iron and B12 at home. Hgb 4.7 >> 7.3 after 3 PRBCs Protonix gtt >> Protonix 40 mg po bid.    4/29 EGD: Gastritis, erosive duodenopathy   *      Chronic Eliquis for A. fib, on hold.  *    Lung cancer hx. infection versus pneumonia in the right base on CXR.  According to [name] 01/13/2019 office note she is due for surveillance chest CT in April 2020.  Due to intervening medical issues she has not been able to make it back to his office.  *   CKD  stage 4.  AKI improved.    PLAN   *    Continue twice daily Protonix for 4 weeks, then change to once daily.  PPI should not be discontinued but other PPIs can be substituted for pantoprazole.  This should replace the ranitidine which she was taking inconsistently PTA.  *  No need for pt to return to GI office unless having recurrent GI issues.  GI signing off.    *   OK to resume Eliquis on 03/28/2019  *     Further decisions as to additional blood transfusions to Hospitalist.    *    Added back her usual daily MiraLAX.  *     She is an inpatient currently in its good to be difficult for her to get back for a follow-up chest CT, I wonder if she should have a CT scan pelvis she is here?         Azucena Freed  03/27/2019, 12:30 PM Phone [phone]   Attending physician's note   I have taken an  interval history, reviewed the chart and examined the patient. I agree with the Advanced Practitioner's note, impression and recommendations.   Gastritis and erosive duodenitis/superficial ulcers in duodenal bulb, GI blood loss exacerbated by chronic anticoagulation.  Protonix twice daily  On chronic anticoagulation for A. Fib, please discuss with cardiology the need for patient to stay on chronic anticoagulation? If she needs to continue Eliquis, okay to restart it tomorrow  Monitor hemoglobin   Complains of bloating and constipation, restarted MiraLAX Dulcolax suppository at bedtime as needed  We will sign off, but available if have any questions or concerns      K. Denzil Magnuson , MD [phone]

## 2019-03-27 NOTE — TOC Initial Note (Signed)
Transition of Care [facility]) - Initial/Assessment Note    Patient Details  Name: Diane [name] MRN: [id number] Date of Birth: Dec 27, 1940  [facility]) CM/SW Contact:    Benard Halsted, LCSW Phone Number: 03/27/2019, 3:15 PM  Clinical Narrative:                 Patient comes to the hospital from [facility] and requests to return at discharge and she is aware that she is in copay days. Facility updated and able to accept patient when stable.   Expected Discharge Plan: Skilled Nursing Facility Barriers to Discharge: Continued Medical Work up   Patient Goals and CMS Choice Patient states their goals for this hospitalization and ongoing recovery are:: Return to rehab CMS Medicare.gov Compare Post Acute Care list provided to:: Patient(NA-From SNF) Choice offered to / list presented to : NA  Expected Discharge Plan and Services Expected Discharge Plan: [place] In-house Referral: Clinical Social Work Discharge Planning Services: NA Post Acute Care Choice: [place] Living arrangements for the past 2 months: [place]                 DME Arranged: N/A DME Agency: NA       HH Arranged: NA [place] Agency: NA        Prior Living Arrangements/Services Living arrangements for the past 2 months: [place] Lives with:: Self, Facility Resident Patient language and need for interpreter reviewed:: Yes Do you feel safe going back to the place where you live?: Yes      Need for Family Participation in Patient Care: No (Comment) Care giver support system in place?: Yes (comment)   Criminal Activity/Legal Involvement Pertinent to Current Situation/Hospitalization: No - Comment as needed  Activities of Daily Living Home Assistive Devices/Equipment: Wheelchair ADL Screening (condition at time of admission) Patient's cognitive ability adequate to safely complete daily activities?: Yes Is the patient deaf or have  difficulty hearing?: No Does the patient have difficulty seeing, even when wearing glasses/contacts?: No Does the patient have difficulty concentrating, remembering, or making decisions?: Yes Patient able to express need for assistance with ADLs?: Yes Does the patient have difficulty dressing or bathing?: Yes Independently performs ADLs?: No Communication: Independent Dressing (OT): Needs assistance Is this a change from baseline?: Pre-admission baseline Grooming: Needs assistance Is this a change from baseline?: Pre-admission baseline Feeding: Independent Bathing: Needs assistance Is this a change from baseline?: Pre-admission baseline Toileting: Needs assistance Is this a change from baseline?: Pre-admission baseline In/Out Bed: Needs assistance Is this a change from baseline?: Pre-admission baseline Walks in Home: Needs assistance(wheelchair) Is this a change from baseline?: Pre-admission baseline Does the patient have difficulty walking or climbing stairs?: Yes Weakness of Legs: Both Weakness of Arms/Hands: Both  Permission Sought/Granted Permission sought to share information with : Facility Art therapist granted to share information with : Yes, Verbal Permission Granted     Permission granted to share info w AGENCY: [place]        Emotional Assessment Appearance:: Appears stated age Attitude/Demeanor/Rapport: Engaged Affect (typically observed): Accepting, Appropriate Orientation: : Oriented to Self, Oriented to Place, Oriented to  Time, Oriented to Situation Alcohol / Substance Use: Not Applicable Psych Involvement: No (comment)  Admission diagnosis:  Anemia due to acute blood loss [D62] Patient Active Problem List   Diagnosis Date Noted  . Gastritis and gastroduodenitis   . Melena 03/24/2019  . Acute blood loss anemia 03/24/2019  . General weakness  02/14/2019  . Diarrhea 02/14/2019  . AKI (acute kidney injury) ([place]) 02/13/2019   . Fall at home, initial encounter 11/30/2018  . Trimalleolar fracture of ankle, closed, left, initial encounter 11/29/2018  . Polyp of cecum   . Polyp of ascending colon   . Polyp of transverse colon   . Polyp of descending colon   . Polyp of rectum   . Heme positive stool 06/11/2018  . Oxygen dependent 06/11/2018  . Status post bronchoscopy with biopsy   . Anemia 04/25/2018  . Chronic respiratory failure with hypoxia ([place]) 01/17/2018  . Hypotension 12/13/2017  . COPD (chronic obstructive pulmonary disease) ([place]) 12/11/2017  . Chronic diastolic heart failure ([place]) 12/03/2017  . Persistent atrial fibrillation; CHA2DS2Vacs = 6; Eliquis; Amiodarone for rhythm control 12/03/2017  . Chronic anticoagulation 12/03/2017  . Morbid obesity ([place]) 12/03/2017  . Kidney disease, chronic, stage III (GFR 30-59 ml/min) (HCC) 12/03/2017  . Hypertensive heart and chronic kidney disease with heart failure and stage 1 through stage 4 chronic kidney disease, or chronic kidney disease ([place]) 12/03/2017  . Coronary artery disease involving native coronary artery of native heart without angina pectoris 12/03/2017  . Aortic atherosclerosis ([place]) 12/03/2017  . Restrictive lung disease 05/23/2017  . Port catheter in place 02-Mar-202017  . Cancer of right lung parenchyma ([place]) 10/17/2011   PCP:  [name], MD Pharmacy:   [place] [address] Phone: [phone] Fax: [phone]     Social Determinants of Health (SDOH) Interventions    Readmission Risk Interventions Readmission Risk Prevention Plan 03/25/2019 02/14/2019  Transportation Screening Complete Complete  Medication Review ([place]) Complete Complete  PCP or Specialist appointment within 3-5 days of discharge Complete Complete  HRI or Home Care Consult Complete Complete  SW Recovery Care/Counseling Consult Complete Complete   Palliative Care Screening Not Applicable Not Applicable  Skilled Nursing Facility Complete Complete  Some recent data might be hidden

## 2019-03-27 NOTE — Progress Notes (Signed)
Initial Nutrition Assessment   RD working remotely  [place]:   Obesity unspecified  INTERVENTION:    Boost Breeze po TID, each supplement provides 250 kcal and 9 grams of protein  NUTRITION DIAGNOSIS:   Increased nutrient needs related to acute illness as evidenced by estimated needs  GOAL:   Patient will meet greater than or equal to 90% of their needs  MONITOR:   PO intake, Supplement acceptance, Labs, Skin, Weight trends, I & O's  REASON FOR ASSESSMENT:   Malnutrition Screening Tool  ASSESSMENT:    78 y.o. Female with PMH significant of NSCLC currently in remission, CHF, HTN; presented to the ED after lab work testing today revealed low HGB.   Admit dx: Anemia due to acute blood loss [D62]  Pt resides at [facility].  RD briefly spoke with pt over phone. She is laying flat on her bed and says it's difficult to talk. Reports her appetite is slowly getting better.  PTA she states she was vomiting and couldn't take her medications. Reveals she would eat "here and there;" unable to provide details. She endorses weight loss; unable to quantify amount or time frame.  Boost Breeze nutrition supplements in place. Labs & medications reviewed. CBG 104.  NUTRITION - FOCUSED PHYSICAL EXAM:  Unable to complete at this time  Diet Order:   Diet Order            Diet Heart Room service appropriate? Yes with Assist; Fluid consistency: Thin  Diet effective now             EDUCATION NEEDS:   Not appropriate for education at this time  Skin:  Skin Assessment: Reviewed RN Assessment  Last BM:  4/27  Height:   Ht Readings from Last 1 Encounters:  03/26/19 5\' 6"  (1.676 m)   Weight:   Wt Readings from Last 1 Encounters:  03/26/19 111.5 kg   BMI:  Body mass index is 39.68 kg/m.  Estimated Nutritional Needs:   Kcal:  1700-1900  Protein:  80-95 gm  Fluid:  1.7-1.9 L  Arthur Holms, RD, LDN Pager #: [phone] After-Hours  Pager #: [phone]

## 2019-03-27 NOTE — Progress Notes (Signed)
PROGRESS NOTE  Diane [name] [facility] TSV:[id number] DOB: 07-16-1941 DOA: 03/24/2019 PCP: [name], MD   LOS: 3 days   Patient is from: home  Brief Narrative / Interim history: 78 year old female with history of NSCLC currently in remission, diastolic CHF, hypertension and A. fib on Eliquis presenting with low hemoglobin in the setting of melanotic bowel movements.  Globin 4.7 on admission.  Received 3 units of blood and hemoglobin trended up to 8.1 and settled at 7.5.  Patient had EGD on 03/26/2019 that revealed gastritis.  Biopsies taken and sent for pathology.    Subjective: No major events overnight.  Patient had EGD yesterday that revealed gastritis.  This morning, she complains about discomfort lying in bed, not getting breakfast, right arm jerking.  She denies pain or dyspnea.  Denies GI or GU symptoms.  Assessment & Plan: Principal Problem:   Melena Active Problems:   Chronic diastolic heart failure (HCC)   Persistent atrial fibrillation; CHA2DS2Vacs = 6; Eliquis; Amiodarone for rhythm control   Kidney disease, chronic, stage III (GFR 30-59 ml/min) (HCC)   Hypertensive heart and chronic kidney disease with heart failure and stage 1 through stage 4 chronic kidney disease, or chronic kidney disease (HCC)   COPD (chronic obstructive pulmonary disease) (HCC)   AKI (acute kidney injury) ([place])   Acute blood loss anemia   Gastritis and gastroduodenitis  Acute blood loss anemia due to melanotic GI bleed/gastritis-stable -Hemoglobin 4.7 on arrival.  Transfused 3 units with appropriate response.  Stable. -Monitor H&H daily -EGD on 4/29 revealed gastritis-biopsy taken -Appreciate GI recs:   - Resume previous diet.  - Continue present medications.  - Await pathology results.  - Follow up H.pylori status  - Use Protonix (pantoprazole) 40 mg PO BID.  - Resume Eliquis (apixaban) at prior dose in 2 days (5/1). -We will check anemia panel although this could be a little bit  unreliable after blood transfusion. -PT/OT eval  Persistent atrial fibrillation: Rate and rhythm controlled.  Chads 2 vasc score 6. -Eliquis on hold -Continue amiodarone -Continue holding Coreg  Chronic diastolic CHF: edematous in all extremities -Resumed home Demadex on 4/29 -Renal function improved.  Good urine output. -Daily weight, intake output and renal function  AKI on CKD 3: presumably prerenal in the setting of hypotension.  Also on Demadex.  Baseline creatinine 1.6.  Improving. -Continue monitoring -Continue home Demadex  COPD: On 2 L by nasal cannula chronically.  Stable. -Continue home medications-Anoro Ellipta and PRN albuterol  Hypokalemia: Resolved.  Anxiety: -Continue home BuSpar  Morbid obesity: BMI 39.68 -Currently poor p.o. intake due to GI bleed and n.p.o. -Feeding supplement  Scheduled Meds: . amiodarone  200 mg Oral Daily  . feeding supplement  1 Container Oral TID BM  . mouth rinse  15 mL Mouth Rinse BID  . pantoprazole  40 mg Oral BID  . torsemide  20 mg Oral Daily  . umeclidinium-vilanterol  1 puff Inhalation Daily   Continuous Infusions:  PRN Meds:.acetaminophen **OR** acetaminophen, albuterol, Melatonin, ondansetron **OR** ondansetron (ZOFRAN) IV, oxyCODONE, sodium chloride flush   DVT prophylaxis: SCD in the setting of bleeding Code Status: Full code Family Communication: None at bedside Disposition Plan: Remains inpatient for acute blood loss anemia due to GI bleed.  Final disposition to be determined after physical therapy evaluation.  Consultants:   Gastroenterology  Procedures:   EGD on 4/29  Microbiology: . None  Antimicrobials:  None  Objective: Vitals:   03/27/19 9233 03/27/19 0076 03/27/19 2263 03/27/19 0940  BP: 108/73   (!) 155/56  Pulse: 78   82  Resp: 16   18  Temp: 97.8 F (36.6 C) 98 F (36.7 C)    TempSrc: Oral     SpO2: 100%  100% 99%  Weight:      Height:        Intake/Output Summary (Last 24  hours) at 03/27/2019 1159 Last data filed at 03/27/2019 0640 Gross per 24 hour  Intake 400 ml  Output 1650 ml  Net -1250 ml   Filed Weights   03/26/19 0648  Weight: 111.5 kg    Examination:  GENERAL: No acute distress.  Appears well.  HEENT: MMM.  Vision and hearing grossly intact.  NECK: Supple.  Difficult to assess JVD due to body habitus LUNGS:  No IWOB.  Fair air movement bilaterally HEART:  RRR. Heart sounds normal.  ABD: Bowel sounds present. Soft.  Diffuse tenderness MSK/EXT:  Moves all extremities. No apparent deformity.  Edema in all extremities SKIN: no apparent skin lesion or wound NEURO: Awake, alert and oriented appropriately.  No gross deficit.  PSYCH: Calm. Normal affect.  Port a cath on left chest  Data Reviewed: I have independently reviewed following labs and imaging studies  CBC: Recent Labs  Lab 03/24/19 2151 03/25/19 1438 03/26/19 0403 03/27/19 0430  WBC 6.8 7.4 5.8 4.6  NEUTROABS 5.3  --   --   --   HGB 4.7* 8.1* 7.5* 7.3*  HCT 15.0* 24.5* 22.9* 22.6*  MCV 104.9* 93.2 95.8 99.1  PLT 150 134* 120* 025*   Basic Metabolic Panel: Recent Labs  Lab 03/24/19 2151 03/25/19 1434 03/26/19 0403 03/27/19 0430  NA 137 140 139 138  K 2.9* 3.7 3.3* 4.5  CL 100 103 103 105  CO2 32 29 28 28   GLUCOSE 119* 96 83 110*  BUN 59* 54* 48* 39*  CREATININE 2.85* 2.66* 2.44* 2.09*  CALCIUM 10.9* 10.7* 10.5* 10.3   GFR: Estimated Creatinine Clearance: 28.1 mL/min (A) (by C-G formula based on SCr of 2.09 mg/dL (H)). Liver Function Tests: Recent Labs  Lab 03/24/19 2151  AST 14*  ALT 10  ALKPHOS 26*  BILITOT 0.6  PROT 4.9*  ALBUMIN 2.4*   No results for input(s): LIPASE, AMYLASE in the last 168 hours. No results for input(s): AMMONIA in the last 168 hours. Coagulation Profile: Recent Labs  Lab 03/24/19 2150  INR 1.9*   Cardiac Enzymes: No results for input(s): CKTOTAL, CKMB, CKMBINDEX, TROPONINI in the last 168 hours. BNP (last 3 results) No  results for input(s): PROBNP in the last 8760 hours. HbA1C: No results for input(s): HGBA1C in the last 72 hours. CBG: Recent Labs  Lab 03/25/19 0138  GLUCAP 104*   Lipid Profile: No results for input(s): CHOL, HDL, LDLCALC, TRIG, CHOLHDL, LDLDIRECT in the last 72 hours. Thyroid Function Tests: No results for input(s): TSH, T4TOTAL, FREET4, T3FREE, THYROIDAB in the last 72 hours. Anemia Panel: Recent Labs    03/27/19 0837  VITAMINB12 664  FOLATE 10.2  FERRITIN 434*  TIBC 197*  IRON 23*  RETICCTPCT 4.2*   Urine analysis:    Component Value Date/Time   COLORURINE YELLOW 03/25/2019 0646   APPEARANCEUR CLEAR 03/25/2019 0646   LABSPEC 1.010 03/25/2019 0646   PHURINE 5.0 03/25/2019 0646   GLUCOSEU NEGATIVE 03/25/2019 0646   HGBUR SMALL (A) 03/25/2019 0646   BILIRUBINUR NEGATIVE 03/25/2019 0646   KETONESUR NEGATIVE 03/25/2019 0646   PROTEINUR NEGATIVE 03/25/2019 0646   UROBILINOGEN 1.0 12/05/2009 1303  NITRITE NEGATIVE 03/25/2019 [place] 03/25/2019 0646   Sepsis Labs: Invalid input(s): PROCALCITONIN, LACTICIDVEN  Recent Results (from the past 240 hour(s))  MRSA PCR Screening     Status: None   Collection Time: 03/25/19  2:38 AM  Result Value Ref Range Status   MRSA by PCR NEGATIVE NEGATIVE Final    Comment:        The GeneXpert MRSA Assay (FDA approved for NASAL specimens only), is one component of a comprehensive MRSA colonization surveillance program. It is not intended to diagnose MRSA infection nor to guide or monitor treatment for MRSA infections. Performed at [facility], [place] [address] [place], [place] [address]       Radiology Studies: No results found.   Taye T. [facility] Pager [phone]  If 7PM-7AM, please contact night-coverage [email or web address] Password [email or web address] 03/27/2019, 11:59 AM

## 2019-03-27 NOTE — NC FL2 (Signed)
[place] LEVEL OF CARE SCREENING TOOL     IDENTIFICATION  Patient Name: Diane [name] Birthdate: Apr 21, 1941 Sex: female Admission Date (Current Location): 03/24/2019  [facility] and [place] Number:  Herbalist and Address:  [facility] [place], [place] [address] [place], [place] [address]      Provider Number: [id number]  Attending Physician Name and Address:  Mercy Riding, MD  Relative Name and Phone Number:  Dominica Severin, spouse, [phone]    Current Level of Care: Hospital Recommended Level of Care: [place] Prior Approval Number:    Date Approved/Denied:   PASRR Number: [id number]  Discharge Plan: SNF    Current Diagnoses: Patient Active Problem List   Diagnosis Date Noted  . Gastritis and gastroduodenitis   . Melena 03/24/2019  . Acute blood loss anemia 03/24/2019  . General weakness 02/14/2019  . Diarrhea 02/14/2019  . AKI (acute kidney injury) ([place]) 02/13/2019  . Fall at home, initial encounter 11/30/2018  . Trimalleolar fracture of ankle, closed, left, initial encounter 11/29/2018  . Polyp of cecum   . Polyp of ascending colon   . Polyp of transverse colon   . Polyp of descending colon   . Polyp of rectum   . Heme positive stool 06/11/2018  . Oxygen dependent 06/11/2018  . Status post bronchoscopy with biopsy   . Anemia 04/25/2018  . Chronic respiratory failure with hypoxia ([place]) 01/17/2018  . Hypotension 12/13/2017  . COPD (chronic obstructive pulmonary disease) ([place]) 12/11/2017  . Chronic diastolic heart failure ([place]) 12/03/2017  . Persistent atrial fibrillation; CHA2DS2Vacs = 6; Eliquis; Amiodarone for rhythm control 12/03/2017  . Chronic anticoagulation 12/03/2017  . Morbid obesity ([place]) 12/03/2017  . Kidney disease, chronic, stage III (GFR 30-59 ml/min) (HCC) 12/03/2017  . Hypertensive heart and chronic kidney disease with heart failure and stage 1 through stage 4 chronic kidney disease, or chronic  kidney disease ([place]) 12/03/2017  . Coronary artery disease involving native coronary artery of native heart without angina pectoris 12/03/2017  . Aortic atherosclerosis ([place]) 12/03/2017  . Restrictive lung disease 05/23/2017  . Port catheter in place Sep 08, 202017  . Cancer of right lung parenchyma ([place]) 10/17/2011    Orientation RESPIRATION BLADDER Height & Weight     Self, Time, Situation, Place  O2(Nasal cannula 2L) Continent Weight: 245 lb 13 oz (111.5 kg) Height:  5\' 6"  (167.6 cm)  BEHAVIORAL SYMPTOMS/MOOD NEUROLOGICAL BOWEL NUTRITION STATUS      Continent Diet(Please see DC Summary)  AMBULATORY STATUS COMMUNICATION OF NEEDS Skin   Extensive Assist Verbally Normal                       Personal Care Assistance Level of Assistance  Bathing, Feeding, Dressing Bathing Assistance: Maximum assistance Feeding assistance: Limited assistance Dressing Assistance: Limited assistance     Functional Limitations Info  Sight, Hearing, Speech Sight Info: Adequate Hearing Info: Adequate Speech Info: Adequate    SPECIAL CARE FACTORS FREQUENCY  PT (By licensed PT), OT (By licensed OT)     PT Frequency: 5x OT Frequency: 3x            Contractures Contractures Info: Not present    Additional Factors Info  Code Status, Allergies Code Status Info: Full Allergies Info: Septra Ds Sulfamethoxazole-trimethoprim           Current Medications (03/27/2019):  This is the current hospital active medication list Current Facility-Administered Medications  Medication Dose Route Frequency Provider Last Rate  Last Dose  . acetaminophen (TYLENOL) tablet 650 mg  650 mg Oral Q6H PRN Etta Quill, DO       Or  . acetaminophen (TYLENOL) suppository 650 mg  650 mg Rectal Q6H PRN Etta Quill, DO      . albuterol (PROVENTIL) (2.5 MG/3ML) 0.083% nebulizer solution 3 mL  3 mL Inhalation Q4H PRN Etta Quill, DO      . amiodarone (PACERONE) tablet 200 mg  200 mg Oral Daily Jennette Kettle M, DO   200 mg at 03/27/19 0940  . feeding supplement (BOOST / RESOURCE BREEZE) liquid 1 Container  1 Container Oral TID BM Donne Hazel, MD   1 Container at 03/27/19 1308  . MEDLINE mouth rinse  15 mL Mouth Rinse BID Jennette Kettle M, DO   15 mL at 03/27/19 0940  . Melatonin TABS 6 mg  6 mg Oral QHS PRN Donne Hazel, MD   6 mg at 03/26/19 2258  . ondansetron (ZOFRAN) tablet 4 mg  4 mg Oral Q6H PRN Etta Quill, DO       Or  . ondansetron [facility]) injection 4 mg  4 mg Intravenous Q6H PRN Etta Quill, DO      . oxyCODONE (Oxy IR/ROXICODONE) immediate release tablet 5 mg  5 mg Oral Q4H PRN Gardiner Barefoot, NP   5 mg at 03/27/19 1446  . pantoprazole (PROTONIX) EC tablet 40 mg  40 mg Oral BID Wendee Beavers T, MD   40 mg at 03/27/19 0940  . polyethylene glycol (MIRALAX / GLYCOLAX) packet 17 g  17 g Oral Daily Vena Rua, PA-C   17 g at 03/27/19 1330  . sodium chloride flush (NS) 0.9 % injection 10-40 mL  10-40 mL Intracatheter PRN Etta Quill, DO      . torsemide [name]) tablet 20 mg  20 mg Oral Daily Wendee Beavers T, MD   20 mg at 03/27/19 0940  . umeclidinium-vilanterol (ANORO ELLIPTA) 62.5-25 MCG/INH 1 puff  1 puff Inhalation Daily Jennette Kettle M, DO   1 puff at 03/27/19 0856   Facility-Administered Medications Ordered in Other Encounters  Medication Dose Route Frequency Provider Last Rate Last Dose  . sodium chloride 0.9 % injection 10 mL  10 mL Intravenous PRN Curt Bears, MD   10 mL at 10/14/13 1030     Discharge Medications: Please see discharge summary for a list of discharge medications.  Relevant Imaging Results:  Relevant Lab Results:   Additional Information [id number] [place], [place]

## 2019-03-28 ENCOUNTER — Encounter (HOSPITAL_COMMUNITY): Payer: Self-pay | Admitting: Gastroenterology

## 2019-03-28 LAB — CBC
HCT: 21.3 % — ABNORMAL LOW (ref 36.0–46.0)
Hemoglobin: 6.7 g/dL — CL (ref 12.0–15.0)
MCH: 31.6 pg (ref 26.0–34.0)
MCHC: 31.5 g/dL (ref 30.0–36.0)
MCV: 100.5 fL — ABNORMAL HIGH (ref 80.0–100.0)
Platelets: 140 10*3/uL — ABNORMAL LOW (ref 150–400)
RBC: 2.12 MIL/uL — ABNORMAL LOW (ref 3.87–5.11)
RDW: 16.9 % — ABNORMAL HIGH (ref 11.5–15.5)
WBC: 5.3 10*3/uL (ref 4.0–10.5)
nRBC: 0 % (ref 0.0–0.2)

## 2019-03-28 LAB — HEMOGLOBIN AND HEMATOCRIT, BLOOD
HCT: 26.8 % — ABNORMAL LOW (ref 36.0–46.0)
Hemoglobin: 8.7 g/dL — ABNORMAL LOW (ref 12.0–15.0)

## 2019-03-28 LAB — BASIC METABOLIC PANEL
Anion gap: 6 (ref 5–15)
BUN: 38 mg/dL — ABNORMAL HIGH (ref 8–23)
CO2: 28 mmol/L (ref 22–32)
Calcium: 9.9 mg/dL (ref 8.9–10.3)
Chloride: 100 mmol/L (ref 98–111)
Creatinine, Ser: 2 mg/dL — ABNORMAL HIGH (ref 0.44–1.00)
GFR calc Af Amer: 27 mL/min — ABNORMAL LOW (ref >60)
GFR calc non Af Amer: 23 mL/min — ABNORMAL LOW (ref >60)
Glucose, Bld: 88 mg/dL (ref 70–99)
Potassium: 3.7 mmol/L (ref 3.5–5.1)
Sodium: 134 mmol/L — ABNORMAL LOW (ref 135–145)

## 2019-03-28 LAB — NOVEL CORONAVIRUS, NAA (HOSP ORDER, SEND-OUT TO REF LAB; TAT 18-24 HRS): SARS-CoV-2, NAA: NOT DETECTED

## 2019-03-28 LAB — PREPARE RBC (CROSSMATCH)

## 2019-03-28 MED ORDER — SODIUM CHLORIDE 0.9% IV SOLUTION: Freq: Once | INTRAVENOUS | Status: DC

## 2019-03-28 MED ORDER — TORSEMIDE 20 MG PO TABS
20.0000 mg | ORAL_TABLET | Freq: Two times a day (BID) | ORAL | Status: DC
  Administered 2019-03-28 – 2019-03-29 (×2): 20 mg via ORAL
  Filled 2019-03-28 (×2): qty 1

## 2019-03-28 MED ORDER — FLEET ENEMA 7-19 GM/118ML RE ENEM
1.0000 | ENEMA | Freq: Once | RECTAL | Status: AC
  Administered 2019-03-28: 1 via RECTAL
  Filled 2019-03-28: qty 1

## 2019-03-28 NOTE — Progress Notes (Signed)
PT Cancellation Note  Patient Details Name: Diane [name] MRN: [id number] DOB: October 22, 1941   Cancelled Treatment:    Reason Eval/Treat Not Completed: Other (comment). Pt's Hbg continuing to drop 7.5 to 7.3 and now to 6.7 today. Pt evaluated this afternoon by OT. PT will f/u with pt tomorrow as available and medically appropriate for full participation in evaluation.    [place] 03/28/2019, 4:35 PM

## 2019-03-28 NOTE — Evaluation (Signed)
Occupational Therapy Evaluation Patient Details Name: Diane [name] MRN: [id number] DOB: 24-Jan-1941 Today's Date: 03/28/2019    History of Present Illness Pt is a 78 yo female s/p UTI and dizziness with low Hgb level. PMHx: diastolic CHF, hypertension and A. fib on Eliquis presenting with low hemoglobin in the setting of melanotic bowel movements.    Clinical Impression   Pt PTA: from SNF and wants to return for therapy. Pt currently performing ADLs with increased assist and poor activity tolerance. Pt unable to properly care for self. Pt with L ankle fx NWB at this time per pt from previous fall.  Pt to increase activity tolerance and mobility in SNF setting.  OT to follow acutely.    Follow Up Recommendations  SNF;Supervision/Assistance - 24 hour    Equipment Recommendations  Other (comment)(to be determined)    Recommendations for Other Services       Precautions / Restrictions Precautions Precautions: None Restrictions Weight Bearing Restrictions: Yes LLE Weight Bearing: Non weight bearing      Mobility Bed Mobility Overal bed mobility: Needs Assistance Bed Mobility: Rolling;Sidelying to Sit;Sit to Supine Rolling: Mod assist Sidelying to sit: Mod assist   Sit to supine: Max assist   General bed mobility comments: LB requires assist and use of bed rails  Transfers                 General transfer comment:  DNT requiring  +2    Balance Overall balance assessment: Needs assistance Sitting-balance support: Single extremity supported Sitting balance-Leahy Scale: Fair                                     ADL either performed or assessed with clinical judgement   ADL Overall ADL's : Needs assistance/impaired Eating/Feeding: Set up;Sitting   Grooming: Set up;Sitting   Upper Body Bathing: Minimal assistance;Sitting   Lower Body Bathing: Maximal assistance;Sitting/lateral leans   Upper Body Dressing : Minimal assistance;Sitting   Lower  Body Dressing: Maximal assistance;Sitting/lateral leans   Toilet Transfer: Maximal assistance;+2 for physical assistance;+2 for safety/equipment;RW;BSC   Toileting- Clothing Manipulation and Hygiene: Maximal assistance;+2 for physical assistance;+2 for safety/equipment;Sitting/lateral lean;Sit to/from stand       Functional mobility during ADLs: Maximal assistance;+2 for physical assistance;+2 for safety/equipment;Rolling walker(DNT) General ADL Comments: requiring increased assist due to weakness and poor activity tolerance     Vision Baseline Vision/History: Wears glasses Patient Visual Report: No change from baseline Vision Assessment?: No apparent visual deficits     Perception     Praxis      Pertinent Vitals/Pain Pain Assessment: Faces Faces Pain Scale: Hurts even more Pain Location: stomach Pain Descriptors / Indicators: Discomfort Pain Intervention(s): Limited activity within patient's tolerance;Monitored during session     Hand Dominance Right   Extremity/Trunk Assessment Upper Extremity Assessment Upper Extremity Assessment: Generalized weakness   Lower Extremity Assessment Lower Extremity Assessment: Generalized weakness   Cervical / Trunk Assessment Cervical / Trunk Assessment: Normal   Communication Communication Communication: No difficulties   Cognition Arousal/Alertness: Awake/alert Behavior During Therapy: WFL for tasks assessed/performed;Restless;Agitated Overall Cognitive Status: Within Functional Limits for tasks assessed                                 General Comments: Pt c/o pain in abdomen for bowel movement that has not happened   General Comments  Exercises     Shoulder Instructions      Home Living Family/patient expects to be discharged to:: Skilled nursing facility Living Arrangements: Spouse/significant other                               Additional Comments: used to have  4 hours daily with  PCS      Prior Functioning/Environment Level of Independence: Needs assistance  Gait / Transfers Assistance Needed: pt reports L ankle fx recently from SNF; +2 for sit to stands ADL's / Homemaking Assistance Needed: requiring assist with all functional tasks.            OT Problem List: Decreased strength;Decreased activity tolerance;Impaired balance (sitting and/or standing);Decreased coordination;Decreased safety awareness;Pain      OT Treatment/Interventions: Self-care/ADL training;Therapeutic exercise;Neuromuscular education;Energy conservation;DME and/or AE instruction;Therapeutic activities;Cognitive remediation/compensation;Visual/perceptual remediation/compensation;Patient/family education;Balance training    OT Goals(Current goals can be found in the care plan section) Acute Rehab OT Goals Patient Stated Goal: I want to get back to SNF for therapy OT Goal Formulation: With patient Time For Goal Achievement: 04/11/19 Potential to Achieve Goals: Good ADL Goals Pt Will Perform Grooming: with modified independence;sitting Pt Will Perform Upper Body Dressing: with modified independence;sitting Pt Will Perform Lower Body Dressing: sit to/from stand;with min assist Pt Will Transfer to Toilet: with mod assist;with +2 assist;squat pivot transfer Additional ADL Goal #1: Pt will perform ADL in sitting x10 mins sitting EOB with AE as needed  OT Frequency: Min 3X/week   Barriers to D/C:            Co-evaluation              AM-PAC OT "6 Clicks" Daily Activity     Outcome Measure Help from another person eating meals?: None Help from another person taking care of personal grooming?: A Lot Help from another person toileting, which includes using toliet, bedpan, or urinal?: Total Help from another person bathing (including washing, rinsing, drying)?: A Lot Help from another person to put on and taking off regular upper body clothing?: A Lot Help from another person to put  on and taking off regular lower body clothing?: Total 6 Click Score: 12   End of Session Nurse Communication: Mobility status  Activity Tolerance: Patient limited by pain;Treatment limited secondary to medical complications (Comment);Patient tolerated treatment well Patient left: in bed;with call bell/phone within reach;with bed alarm set  OT Visit Diagnosis: Muscle weakness (generalized) (M62.81);Other abnormalities of gait and mobility (R26.89);Pain Pain - part of body: Ankle and joints of foot                Time: 1400-1440 OT Time Calculation (min): 40 min Charges:  OT General Charges $OT Visit: 1 Visit OT Evaluation $OT Eval Moderate Complexity: 1 Mod OT Treatments $Self Care/Home Management : 8-22 mins $Neuromuscular Re-education: 8-22 mins  Darryl Nestle) Marsa Aris OTR/L Acute Rehabilitation Services Pager: [phone] Office: [place] 03/28/2019, 4:59 PM

## 2019-03-28 NOTE — Progress Notes (Signed)
PROGRESS NOTE  Diane [name] [facility] SHF:[id number] DOB: 1941/10/31 DOA: 03/24/2019 PCP: [name], MD   LOS: 4 days   Patient is from: home  Brief Narrative / Interim history: 78 year old female with history of NSCLC currently in remission, diastolic CHF, hypertension and A. fib on Eliquis presenting with low hemoglobin in the setting of melanotic bowel movements.  [place] 4.7 on admission.  Received 3 units of blood and hemoglobin trended up to 8.1 and settled at 7.5.  Patient had EGD on 03/26/2019 that revealed gastritis.  Biopsies taken and sent for pathology.    Subjective: No major events overnight.  Hemoglobin dropped to 6.7 this morning.  Complains vague left abdominal pain and some chest tightness.  Does not remember her last bowel movement.  She says she does not feel constipated.  Assessment & Plan: Principal Problem:   [place] Active Problems:   Cancer of right lung parenchyma (HCC)   Chronic diastolic heart failure (HCC)   Persistent atrial fibrillation; CHA2DS2Vacs = 6; Eliquis; Amiodarone for rhythm control   Kidney disease, chronic, stage III (GFR 30-59 ml/min) (HCC)   Hypertensive heart and chronic kidney disease with heart failure and stage 1 through stage 4 chronic kidney disease, or chronic kidney disease (HCC)   COPD (chronic obstructive pulmonary disease) ([place])   AKI (acute kidney injury) ([place])   Acute blood loss anemia   Gastritis and gastroduodenitis  Acute blood loss anemia due to melanotic GI bleed/gastritis-stable -EGD on 4/29 revealed gastritis-biopsy obtained. -Duodenal biopsy consistent with peptic injury.  Gastric biopsy consistent with reactive gastropathy.  No metaplasia, dysplasia or malignancy.   -H. pylori negative. -Anemia panel consistent with anemia of chronic disease but after blood transfusion. -Hemoglobin 4.7--3 units--8.1--7.5--6.7--1 unit -Posttransfusion H&H -Appreciate GI recs:  - Use Protonix (pantoprazole) 40 mg PO BID.  - Resume  Eliquis (apixaban) at prior dose in 2 days (5/1).  Unfortunately, hemoglobin dropped again. -PT/OT-SNF when medically ready.  Persistent atrial fibrillation: Rate and rhythm controlled.  Chads 2 vasc score 6. -Eliquis on hold-she may not be a candidate for chronic anticoagulation due to GI bleed. -Continue amiodarone -Continue holding Coreg  Chronic diastolic CHF: edematous in all extremities -Increase home Demadex to twice daily. -Daily weight, intake output and renal function  AKI on CKD 3: presumably prerenal in the setting of hypotension.  Also on Demadex.  Baseline creatinine 1.6.  Improving. -Continue monitoring -Demadex as above  COPD: On 2 L by nasal cannula chronically.  Stable. -Continue home medications-Anoro Ellipta and PRN albuterol  Hypokalemia: Resolved.  Anxiety: -Continue home BuSpar  Morbid obesity: BMI 39.68 -Currently poor p.o. intake due to GI bleed and n.p.o. -Feeding supplement  Scheduled Meds: . sodium chloride   Intravenous Once  . amiodarone  200 mg Oral Daily  . feeding supplement  1 Container Oral TID BM  . mouth rinse  15 mL Mouth Rinse BID  . pantoprazole  40 mg Oral BID  . polyethylene glycol  17 g Oral Daily  . torsemide  20 mg Oral Daily  . umeclidinium-vilanterol  1 puff Inhalation Daily   Continuous Infusions:  PRN Meds:.acetaminophen **OR** acetaminophen, albuterol, bisacodyl, Melatonin, ondansetron **OR** ondansetron (ZOFRAN) IV, oxyCODONE, sodium chloride flush   DVT prophylaxis: SCD in the setting of bleeding Code Status: Full code Family Communication: None at bedside Disposition Plan: Remains inpatient for acute blood loss anemia due to GI bleed.  Final disposition SNF when medically ready.  Consultants:   Gastroenterology  Procedures:   EGD on 4/29  Microbiology: . None  Antimicrobials:  None  Objective: Vitals:   03/28/19 0826 03/28/19 0828 03/28/19 1135 03/28/19 1206  BP:   (!) 168/59 (!) 164/60  Pulse:  76  84 85  Resp:  18 16 16   Temp:   98.6 F (37 C) 98.6 F (37 C)  TempSrc:   Oral Oral  SpO2: 100% 100% 100% 100%  Weight:      Height:        Intake/Output Summary (Last 24 hours) at 03/28/2019 1243 Last data filed at 03/28/2019 1225 Gross per 24 hour  Intake 580 ml  Output 700 ml  Net -120 ml   Filed Weights   03/26/19 0648  Weight: 111.5 kg    Examination:  GENERAL: No acute distress.  Appears well.  HEENT: MMM.  Vision and hearing grossly intact.  NECK: Supple.  No JVD.  LUNGS:  No IWOB.  Fair air movement bilaterally but limited exam due to body habitus. HEART:  RRR. Heart sounds normal.  ABD: Bowel sounds present. Soft.  Mild tenderness over right abdomen.  No rebound or guarding. MSK/EXT:  Moves all extremities. No apparent deformity.  Pitting edema in all extremities, LE greater than UE SKIN: no apparent skin lesion or wound NEURO: Awake, alert and oriented appropriately.  No gross deficit.  PSYCH: Calm. Normal affect. Port a cath on left chest  Data Reviewed: I have independently reviewed following labs and imaging studies  CBC: Recent Labs  Lab 03/24/19 2151 03/25/19 1438 03/26/19 0403 03/27/19 0430 03/28/19 0352  WBC 6.8 7.4 5.8 4.6 5.3  NEUTROABS 5.3  --   --   --   --   HGB 4.7* 8.1* 7.5* 7.3* 6.7*  HCT 15.0* 24.5* 22.9* 22.6* 21.3*  MCV 104.9* 93.2 95.8 99.1 100.5*  PLT 150 134* 120* 136* 509*   Basic Metabolic Panel: Recent Labs  Lab 03/24/19 2151 03/25/19 1434 03/26/19 0403 03/27/19 0430 03/28/19 0352  NA 137 140 139 138 134*  K 2.9* 3.7 3.3* 4.5 3.7  CL 100 103 103 105 100  CO2 32 29 28 28 28   GLUCOSE 119* 96 83 110* 88  BUN 59* 54* 48* 39* 38*  CREATININE 2.85* 2.66* 2.44* 2.09* 2.00*  CALCIUM 10.9* 10.7* 10.5* 10.3 9.9   GFR: Estimated Creatinine Clearance: 29.4 mL/min (A) (by C-G formula based on SCr of 2 mg/dL (H)). Liver Function Tests: Recent Labs  Lab 03/24/19 2151  AST 14*  ALT 10  ALKPHOS 26*  BILITOT 0.6  PROT 4.9*   ALBUMIN 2.4*   No results for input(s): LIPASE, AMYLASE in the last 168 hours. No results for input(s): AMMONIA in the last 168 hours. Coagulation Profile: Recent Labs  Lab 03/24/19 2150  INR 1.9*   Cardiac Enzymes: No results for input(s): CKTOTAL, CKMB, CKMBINDEX, TROPONINI in the last 168 hours. BNP (last 3 results) No results for input(s): PROBNP in the last 8760 hours. HbA1C: No results for input(s): HGBA1C in the last 72 hours. CBG: Recent Labs  Lab 03/25/19 0138  GLUCAP 104*   Lipid Profile: No results for input(s): CHOL, HDL, LDLCALC, TRIG, CHOLHDL, LDLDIRECT in the last 72 hours. Thyroid Function Tests: No results for input(s): TSH, T4TOTAL, FREET4, T3FREE, THYROIDAB in the last 72 hours. Anemia Panel: Recent Labs    03/27/19 0837  VITAMINB12 664  FOLATE 10.2  FERRITIN 434*  TIBC 197*  IRON 23*  RETICCTPCT 4.2*   Urine analysis:    Component Value Date/Time   COLORURINE YELLOW 03/25/2019 0646  APPEARANCEUR CLEAR 03/25/2019 0646   LABSPEC 1.010 03/25/2019 0646   PHURINE 5.0 03/25/2019 0646   GLUCOSEU NEGATIVE 03/25/2019 0646   HGBUR SMALL (A) 03/25/2019 0646   BILIRUBINUR NEGATIVE 03/25/2019 0646   KETONESUR NEGATIVE 03/25/2019 0646   PROTEINUR NEGATIVE 03/25/2019 0646   UROBILINOGEN 1.0 12/05/2009 1303   NITRITE NEGATIVE 03/25/2019 0646   LEUKOCYTESUR NEGATIVE 03/25/2019 0646   Sepsis Labs: Invalid input(s): PROCALCITONIN, LACTICIDVEN  Recent Results (from the past 240 hour(s))  MRSA PCR Screening     Status: None   Collection Time: 03/25/19  2:38 AM  Result Value Ref Range Status   MRSA by PCR NEGATIVE NEGATIVE Final    Comment:        The GeneXpert MRSA Assay (FDA approved for NASAL specimens only), is one component of a comprehensive MRSA colonization surveillance program. It is not intended to diagnose MRSA infection nor to guide or monitor treatment for MRSA infections. Performed at [facility], [place] [address],  [place], [place] [address]   Novel Coronavirus, NAA (hospital order; send-out to ref lab)     Status: None   Collection Time: 03/27/19  3:25 PM  Result Value Ref Range Status   SARS-CoV-2, NAA NOT DETECTED NOT DETECTED Final    Comment: (NOTE) This test was developed and its performance characteristics determined by [employer]. This test has not been FDA cleared or approved. This test has been authorized by FDA under an Emergency Use Authorization (EUA). This test has been validated in accordance with the FDA's Guidance Document (Policy for [place] in Laboratories Certified to Perform High Complexity Testing under CLIA prior to Emergency Use Authorization for Coronavirus [id number] during the [facility]) issued on February 29th, 2020. FDA independent review of this validation is pending. This test is only authorized for the duration of time the declaration that circumstances exist justifying the authorization of the emergency use of in vitro diagnostic tests for detection of SARS-CoV- 2 virus and/or diagnosis of COVID-19 infection under section 564(b)(1) of the Act, 21 U.S.C. 503TWS-5(K)(8), unless the authorization is terminated or revoked sooner. Performed At: [facility] [address] [place], [place] [id number] Rush Farmer MD VC:[id number]    [place]  Final    Comment: Performed at [facility], [place] [address] [place], [place] [address]      Radiology Studies: No results found.    T. [facility] Pager [phone]  If 7PM-7AM, please contact night-coverage [email or web address] Password [facility] 03/28/2019, 12:43 PM

## 2019-03-28 NOTE — Progress Notes (Signed)
CRITICAL VALUE ALERT  Critical Value:  hemoglobin 6.7  Date & Time Notied:  03/28/2019 07:35 Provider Notified:  MD. Cyndia Skeeters

## 2019-03-28 NOTE — Care Management Important Message (Signed)
Important Message  Patient Details  Name: Diane [name] MRN: [id number] Date of Birth: [birth year]   Medicare Important Message Given:       Orbie Pyo 03/28/2019, 3:59 PM

## 2019-03-29 LAB — BASIC METABOLIC PANEL
Anion gap: 8 (ref 5–15)
BUN: 34 mg/dL — ABNORMAL HIGH (ref 8–23)
CO2: 32 mmol/L (ref 22–32)
Calcium: 10.1 mg/dL (ref 8.9–10.3)
Chloride: 97 mmol/L — ABNORMAL LOW (ref 98–111)
Creatinine, Ser: 2.06 mg/dL — ABNORMAL HIGH (ref 0.44–1.00)
GFR calc Af Amer: 26 mL/min — ABNORMAL LOW (ref >60)
GFR calc non Af Amer: 23 mL/min — ABNORMAL LOW (ref >60)
Glucose, Bld: 101 mg/dL — ABNORMAL HIGH (ref 70–99)
Potassium: 3.1 mmol/L — ABNORMAL LOW (ref 3.5–5.1)
Sodium: 137 mmol/L (ref 135–145)

## 2019-03-29 LAB — CBC
HCT: 25.5 % — ABNORMAL LOW (ref 36.0–46.0)
Hemoglobin: 8.3 g/dL — ABNORMAL LOW (ref 12.0–15.0)
MCH: 31.1 pg (ref 26.0–34.0)
MCHC: 32.5 g/dL (ref 30.0–36.0)
MCV: 95.5 fL (ref 80.0–100.0)
Platelets: 153 10*3/uL (ref 150–400)
RBC: 2.67 MIL/uL — ABNORMAL LOW (ref 3.87–5.11)
RDW: 17.6 % — ABNORMAL HIGH (ref 11.5–15.5)
WBC: 7 10*3/uL (ref 4.0–10.5)
nRBC: 0 % (ref 0.0–0.2)

## 2019-03-29 LAB — TYPE AND SCREEN
ABO/RH(D): A POS
Antibody Screen: NEGATIVE
Unit division: 0

## 2019-03-29 LAB — BPAM RBC
Blood Product Expiration Date: 202005052359
ISSUE DATE / TIME: 202005011141
Unit Type and Rh: 6200

## 2019-03-29 LAB — MAGNESIUM: Magnesium: 1.7 mg/dL (ref 1.7–2.4)

## 2019-03-29 MED ORDER — APIXABAN 5 MG PO TABS
5.0000 mg | ORAL_TABLET | Freq: Two times a day (BID) | ORAL | Status: DC
  Administered 2019-03-29 – 2019-03-30 (×3): 5 mg via ORAL
  Filled 2019-03-29 (×3): qty 1

## 2019-03-29 MED ORDER — TORSEMIDE 20 MG PO TABS
20.0000 mg | ORAL_TABLET | Freq: Every day | ORAL | Status: DC
  Administered 2019-03-30: 20 mg via ORAL
  Filled 2019-03-29: qty 1

## 2019-03-29 MED ORDER — POTASSIUM CHLORIDE CRYS ER 20 MEQ PO TBCR
40.0000 meq | EXTENDED_RELEASE_TABLET | ORAL | Status: AC
  Administered 2019-03-29 (×2): 40 meq via ORAL
  Filled 2019-03-29 (×2): qty 2

## 2019-03-29 MED ORDER — MAGNESIUM SULFATE IN D5W 1-5 GM/100ML-% IV SOLN
1.0000 g | Freq: Once | INTRAVENOUS | Status: AC
  Administered 2019-03-29: 1 g via INTRAVENOUS
  Filled 2019-03-29: qty 100

## 2019-03-29 NOTE — Evaluation (Signed)
Physical Therapy Evaluation Patient Details Name: Diane [name] MRN: [id number] DOB: 09/30/1941 Today's Date: 03/29/2019   History of Present Illness  Pt is a 78 yo female s/p UTI and dizziness with low Hgb level. PMHx: diastolic CHF, hypertension and A. fib on Eliquis presenting with low hemoglobin in the setting of melanotic bowel movements.   Clinical Impression  Pt admitted with above diagnosis. Pt currently with functional limitations due to the deficits listed below (see PT Problem List). Pt limited with mobility in hospital because she does not have her CAM boot here and she had begin [place] with it on at rehab. Without it she is unable to keep LLE NWB. Needed mod A +2 to get to EOB in sitting. Max A to scoot along SOB to HOB. Max A +2 to return to supine. Pt deferred LE exercises in sitting and supine.  Pt will benefit from skilled PT to increase their independence and safety with mobility to allow discharge to the venue listed below.       Follow Up Recommendations SNF;Supervision/Assistance - 24 hour    Equipment Recommendations  None recommended by PT    Recommendations for Other Services       Precautions / Restrictions Precautions Precautions: None Restrictions Weight Bearing Restrictions: No LLE Weight Bearing: Non weight bearing      Mobility  Bed Mobility Overal bed mobility: Needs Assistance Bed Mobility: Rolling;Sidelying to Sit;Sit to Supine Rolling: Mod assist Sidelying to sit: Mod assist;+2 for physical assistance   Sit to supine: Max assist;+2 for physical assistance   General bed mobility comments: mod A for LE's off bed and elevation or trunk. Pt fearful of sliding off EOB, mod A to scoot hips to EOB. Max A +2 for return to supine and positioning in bed  Transfers                 General transfer comment: pt unable to stand and maintain LLE NWB, has only been doing this with CAM boot when she is allowed to put some wt  Ambulation/Gait              General Gait Details: unable without CAM boot'  Stairs            Wheelchair Mobility    Modified Rankin (Stroke Patients Only)       Balance Overall balance assessment: Needs assistance Sitting-balance support: Feet supported;No upper extremity supported Sitting balance-Leahy Scale: Fair Sitting balance - Comments: pt sat EOB to eat lunch, fatigues quickly in upright sitting                                     Pertinent Vitals/Pain Pain Assessment: No/denies pain    Home Living Family/patient expects to be discharged to:: Skilled nursing facility Living Arrangements: Spouse/significant other                    Prior Function Level of Independence: Needs assistance   Gait / Transfers Assistance Needed: pt reports L ankle fx recently from SNF; +2 for sit to stands and had begun working on ambulation but only with CAM boot  ADL's / Homemaking Assistance Needed: requiring assist with all functional tasks.  Comments: w/c mostly     Hand Dominance   Dominant Hand: Right    Extremity/Trunk Assessment   Upper Extremity Assessment Upper Extremity Assessment: Defer to OT evaluation    Lower  Extremity Assessment Lower Extremity Assessment: Generalized weakness;LLE deficits/detail LLE Deficits / Details: bruising still evident L lateral ankle from fx in Jan 2020, knee flex limited to 85 deg bilaterally LLE Sensation: WNL LLE Coordination: WNL    Cervical / Trunk Assessment Cervical / Trunk Assessment: Normal  Communication   Communication: No difficulties  Cognition Arousal/Alertness: Awake/alert Behavior During Therapy: WFL for tasks assessed/performed;Agitated Overall Cognitive Status: Within Functional Limits for tasks assessed                                        General Comments General comments (skin integrity, edema, etc.): encouraged pt to perform exercises but she deferred at this time     Exercises     Assessment/Plan    PT Assessment Patient needs continued PT services  PT Problem List Decreased strength;Decreased activity tolerance;Decreased balance;Decreased mobility;Pain;Obesity       PT Treatment Interventions Therapeutic activities;Therapeutic exercise;Functional mobility training;Balance training;Neuromuscular re-education;Cognitive remediation;Patient/family education    PT Goals (Current goals can be found in the Care Plan section)  Acute Rehab PT Goals Patient Stated Goal: I want to get back to SNF for therapy PT Goal Formulation: With patient Time For Goal Achievement: 04/12/19 Potential to Achieve Goals: Fair    Frequency Min 2X/week   Barriers to discharge        Co-evaluation               AM-PAC PT "6 Clicks" Mobility  Outcome Measure Help needed turning from your back to your side while in a flat bed without using bedrails?: A Lot Help needed moving from lying on your back to sitting on the side of a flat bed without using bedrails?: A Lot Help needed moving to and from a bed to a chair (including a wheelchair)?: Total Help needed standing up from a chair using your arms (e.g., wheelchair or bedside chair)?: Total Help needed to walk in hospital room?: Total Help needed climbing 3-5 steps with a railing? : Total 6 Click Score: 8    End of Session Equipment Utilized During Treatment: Oxygen Activity Tolerance: Patient limited by fatigue Patient left: in bed;with bed alarm set;with call bell/phone within reach Nurse Communication: Mobility status PT Visit Diagnosis: Muscle weakness (generalized) (M62.81);Difficulty in walking, not elsewhere classified (R26.2);Pain    Time: 1147-1200 PT Time Calculation (min) (ACUTE ONLY): 13 min   Charges:   PT Evaluation $PT Eval Moderate Complexity: [place], PT  Acute Rehab Services  Pager [phone] Office [place] 03/29/2019, 2:12  PM

## 2019-03-29 NOTE — Progress Notes (Signed)
PROGRESS NOTE  Diane [name] HBZ:[id number] DOB: 1941/01/03 DOA: 03/24/2019 PCP: [name], MD   LOS: 5 days   Patient is from: home  Brief Narrative / Interim history: 78 year old female with history of NSCLC currently in remission, diastolic CHF, hypertension and A. fib on Eliquis presenting with low hemoglobin in the setting of melanotic bowel movements.  Globin 4.7 on admission.  Received 3 units of blood and hemoglobin trended up to 8.1 and settled at 7.5.  Patient had EGD on 03/26/2019 that revealed gastritis.  Biopsies taken and sent for pathology.   Subjective: No major events overnight.  No complaints this morning. Denies chest pain, dyspnea or abdominal pain.  He says she had a bowel movement yesterday.  Hemoglobin 8.3 this morning which is more realistic after 1 unit yesterday.  Assessment & Plan: Principal Problem:   Melena Active Problems:   Cancer of right lung parenchyma (HCC)   Chronic diastolic heart failure (HCC)   Persistent atrial fibrillation; CHA2DS2Vacs = 6; Eliquis; Amiodarone for rhythm control   Kidney disease, chronic, stage III (GFR 30-59 ml/min) (HCC)   Hypertensive heart and chronic kidney disease with heart failure and stage 1 through stage 4 chronic kidney disease, or chronic kidney disease (HCC)   COPD (chronic obstructive pulmonary disease) ([place])   AKI (acute kidney injury) ([place])   Acute blood loss anemia   Gastritis and gastroduodenitis  Acute blood loss anemia due to melanotic GI bleed/gastritis-stable -EGD on 4/29 revealed gastritis-biopsy obtained. -Duodenal biopsy consistent with peptic injury.  Gastric biopsy consistent with reactive gastropathy.  No metaplasia, dysplasia or malignancy.   -H. pylori negative. -Anemia panel consistent with anemia of chronic disease but after blood transfusion. -Hemoglobin 4.7--3 units--8.1--7.5--6.7--1 unit--8.3 -Recheck H&H in the morning -Appreciate GI recs:  - Use Protonix (pantoprazole) 40 mg  PO BID. -Resumed Eliquis 5/2 -PT/OT-SNF when medically ready.  Persistent atrial fibrillation: Rate and rhythm controlled.  Chads 2 vasc score 6. -Resume home Eliquis -Continue amiodarone -Continue holding Coreg  Chronic diastolic CHF: Edema improved.  Excellent urine output.  Creatinine stable -We will cut back Demadex to home dose -Daily weight, intake output and renal function  AKI on CKD 3: presumably prerenal in the setting of hypotension.  Also on Demadex.  Baseline creatinine 1.6.  Improving. -Continue monitoring -Demadex as above  COPD: On 2 L by nasal cannula chronically.  Stable. -Continue home medications-Anoro Ellipta and PRN albuterol  Hypokalemia: Likely due to diuretics as above -Replenish and recheck -Magnesium 1.7-we will give 1 g  Anxiety: Stable -Continue home BuSpar  Morbid obesity: BMI 39.68 -Feeding supplement  Scheduled Meds: . sodium chloride   Intravenous Once  . amiodarone  200 mg Oral Daily  . apixaban  5 mg Oral BID  . feeding supplement  1 Container Oral TID BM  . mouth rinse  15 mL Mouth Rinse BID  . pantoprazole  40 mg Oral BID  . polyethylene glycol  17 g Oral Daily  . potassium chloride  40 mEq Oral Q4H  . torsemide  20 mg Oral BID  . umeclidinium-vilanterol  1 puff Inhalation Daily   Continuous Infusions:  PRN Meds:.acetaminophen **OR** acetaminophen, albuterol, bisacodyl, Melatonin, ondansetron **OR** ondansetron (ZOFRAN) IV, oxyCODONE, sodium chloride flush   DVT prophylaxis: SCD in the setting of bleeding Code Status: Full code Family Communication: None at bedside Disposition Plan: Remains inpatient for acute blood loss anemia due to GI bleed.  Anticipate discharge back to SNF in the next 24 if H&H stable  after resuming Eliquis  Consultants:   Gastroenterology  Procedures:   EGD on 4/29  Microbiology: . None  Antimicrobials:  None  Objective: Vitals:   03/28/19 1410 03/28/19 2124 03/29/19 0519 03/29/19 0845   BP: 137/75 (!) 155/60 (!) 166/60   Pulse: 87 86 87   Resp: 16 16 18    Temp: 98.5 F (36.9 C) 98.1 F (36.7 C) 98.2 F (36.8 C)   TempSrc: Oral Oral Oral   SpO2: 100% 100% 99% 98%  Weight:      Height:        Intake/Output Summary (Last 24 hours) at 03/29/2019 1314 Last data filed at 03/29/2019 1240 Gross per 24 hour  Intake 600 ml  Output 1650 ml  Net -1050 ml   Filed Weights   03/26/19 0648  Weight: 111.5 kg    Examination:  GENERAL: No acute distress.  Appears well.  HEENT: MMM.  Vision and hearing grossly intact.  NECK: Supple.  No apparent JVD.  LUNGS:  No IWOB.  Fair air movement bilaterally but limited exam due to body habitus HEART:  RRR. Heart sounds normal.  ABD: Bowel sounds present. Soft. Non tender.  MSK/EXT:  Moves all extremities. No apparent deformity.  1+ edema in lower extremities bilaterally. SKIN: no apparent skin lesion or wound NEURO: Awake, alert and oriented appropriately.  No gross deficit.  PSYCH: Calm. Normal affect. Port a cath on left chest  Data Reviewed: I have independently reviewed following labs and imaging studies  CBC: Recent Labs  Lab 03/24/19 2151 03/25/19 1438 03/26/19 0403 03/27/19 0430 03/28/19 0352 03/28/19 1656 03/29/19 0343  WBC 6.8 7.4 5.8 4.6 5.3  --  7.0  NEUTROABS 5.3  --   --   --   --   --   --   HGB 4.7* 8.1* 7.5* 7.3* 6.7* 8.7* 8.3*  HCT 15.0* 24.5* 22.9* 22.6* 21.3* 26.8* 25.5*  MCV 104.9* 93.2 95.8 99.1 100.5*  --  95.5  PLT 150 134* 120* 136* 140*  --  712   Basic Metabolic Panel: Recent Labs  Lab 03/25/19 1434 03/26/19 0403 03/27/19 0430 03/28/19 0352 03/29/19 0343  NA 140 139 138 134* 137  K 3.7 3.3* 4.5 3.7 3.1*  CL 103 103 105 100 97*  CO2 29 28 28 28  32  GLUCOSE 96 83 110* 88 101*  BUN 54* 48* 39* 38* 34*  CREATININE 2.66* 2.44* 2.09* 2.00* 2.06*  CALCIUM 10.7* 10.5* 10.3 9.9 10.1  MG  --   --   --   --  1.7   GFR: Estimated Creatinine Clearance: 28.5 mL/min (A) (by C-G formula based  on SCr of 2.06 mg/dL (H)). Liver Function Tests: Recent Labs  Lab 03/24/19 2151  AST 14*  ALT 10  ALKPHOS 26*  BILITOT 0.6  PROT 4.9*  ALBUMIN 2.4*   No results for input(s): LIPASE, AMYLASE in the last 168 hours. No results for input(s): AMMONIA in the last 168 hours. Coagulation Profile: Recent Labs  Lab 03/24/19 2150  INR 1.9*   Cardiac Enzymes: No results for input(s): CKTOTAL, CKMB, CKMBINDEX, TROPONINI in the last 168 hours. BNP (last 3 results) No results for input(s): PROBNP in the last 8760 hours. HbA1C: No results for input(s): HGBA1C in the last 72 hours. CBG: Recent Labs  Lab 03/25/19 0138  GLUCAP 104*   Lipid Profile: No results for input(s): CHOL, HDL, LDLCALC, TRIG, CHOLHDL, LDLDIRECT in the last 72 hours. Thyroid Function Tests: No results for input(s): TSH, T4TOTAL, FREET4,  T3FREE, THYROIDAB in the last 72 hours. Anemia Panel: Recent Labs    03/27/19 0837  VITAMINB12 664  FOLATE 10.2  FERRITIN 434*  TIBC 197*  IRON 23*  RETICCTPCT 4.2*   Urine analysis:    Component Value Date/Time   COLORURINE YELLOW 03/25/2019 0646   APPEARANCEUR CLEAR 03/25/2019 0646   LABSPEC 1.010 03/25/2019 0646   PHURINE 5.0 03/25/2019 0646   GLUCOSEU NEGATIVE 03/25/2019 0646   HGBUR SMALL (A) 03/25/2019 0646   BILIRUBINUR NEGATIVE 03/25/2019 0646   KETONESUR NEGATIVE 03/25/2019 0646   PROTEINUR NEGATIVE 03/25/2019 0646   UROBILINOGEN 1.0 12/05/2009 1303   NITRITE NEGATIVE 03/25/2019 0646   LEUKOCYTESUR NEGATIVE 03/25/2019 0646   Sepsis Labs: Invalid input(s): PROCALCITONIN, LACTICIDVEN  Recent Results (from the past 240 hour(s))  MRSA PCR Screening     Status: None   Collection Time: 03/25/19  2:38 AM  Result Value Ref Range Status   MRSA by PCR NEGATIVE NEGATIVE Final    Comment:        The GeneXpert MRSA Assay (FDA approved for NASAL specimens only), is one component of a comprehensive MRSA colonization surveillance program. It is not intended to  diagnose MRSA infection nor to guide or monitor treatment for MRSA infections. Performed at [facility], [place] [address] [place], [place] [address]   Novel Coronavirus, NAA (hospital order; send-out to ref lab)     Status: None   Collection Time: 03/27/19  3:25 PM  Result Value Ref Range Status   SARS-CoV-2, NAA NOT DETECTED NOT DETECTED Final    Comment: (NOTE) This test was developed and its performance characteristics determined by [employer]. This test has not been FDA cleared or approved. This test has been authorized by FDA under an Emergency Use Authorization (EUA). This test has been validated in accordance with the FDA's Guidance Document (Policy for [place] in Laboratories Certified to Perform High Complexity Testing under CLIA prior to Emergency Use Authorization for Coronavirus [id number] during the [facility] Emergency) issued on February 29th, 2020. FDA independent review of this validation is pending. This test is only authorized for the duration of time the declaration that circumstances exist justifying the authorization of the emergency use of in vitro diagnostic tests for detection of SARS-CoV- 2 virus and/or diagnosis of COVID-19 infection under section 564(b)(1) of the Act, 21 U.S.C. 468EHO-1(Y)(2), unless the authorization is terminated or revoked sooner. Performed At: [facility] [address] [place], [place] [id number] Rush Farmer MD WU:[id number]    [place]  Final    Comment: Performed at [facility], [place] [address] [place], [place] [address]      Radiology Studies: No results found.   Rayneisha Bouza T. [facility] Pager [phone]  If 7PM-7AM, please contact night-coverage [email or web address] Password [email or web address] 03/29/2019, 1:14 PM

## 2019-03-29 NOTE — Progress Notes (Signed)
Fleet enema administered with positive results. Pt had 3  Bowel movements  this shift

## 2019-03-30 LAB — BASIC METABOLIC PANEL
Anion gap: 8 (ref 5–15)
BUN: 27 mg/dL — ABNORMAL HIGH (ref 8–23)
CO2: 32 mmol/L (ref 22–32)
Calcium: 9.6 mg/dL (ref 8.9–10.3)
Chloride: 98 mmol/L (ref 98–111)
Creatinine, Ser: 1.95 mg/dL — ABNORMAL HIGH (ref 0.44–1.00)
GFR calc Af Amer: 28 mL/min — ABNORMAL LOW (ref >60)
GFR calc non Af Amer: 24 mL/min — ABNORMAL LOW (ref >60)
Glucose, Bld: 91 mg/dL (ref 70–99)
Potassium: 3.5 mmol/L (ref 3.5–5.1)
Sodium: 138 mmol/L (ref 135–145)

## 2019-03-30 LAB — HEMOGLOBIN AND HEMATOCRIT, BLOOD
HCT: 25.8 % — ABNORMAL LOW (ref 36.0–46.0)
Hemoglobin: 8.2 g/dL — ABNORMAL LOW (ref 12.0–15.0)

## 2019-03-30 MED ORDER — POTASSIUM CHLORIDE CRYS ER 20 MEQ PO TBCR
40.0000 meq | EXTENDED_RELEASE_TABLET | Freq: Once | ORAL | Status: AC
  Administered 2019-03-30: 40 meq via ORAL
  Filled 2019-03-30: qty 2

## 2019-03-30 MED ORDER — HEPARIN SOD (PORK) LOCK FLUSH 100 UNIT/ML IV SOLN
500.0000 [IU] | INTRAVENOUS | Status: AC | PRN
  Administered 2019-03-30: 500 [IU]

## 2019-03-30 MED ORDER — PANTOPRAZOLE SODIUM 40 MG PO TBEC: 40.0000 mg | DELAYED_RELEASE_TABLET | Freq: Two times a day (BID) | ORAL | 0 refills | Status: AC

## 2019-03-30 NOTE — Discharge Summary (Signed)
Physician Discharge Summary  Diane [name] [facility] QQP:[id number] DOB: May 30, 1941 DOA: 03/24/2019  PCP: [name], MD  Admit date: 03/24/2019 Discharge date: 03/30/2019  Admitted From: SNF Disposition: SNF  Recommendations for Outpatient Follow-up:  1. Follow up with PCP in 1 weeks 2. Please obtain CBC/BMP/Mag at follow up 3. Please follow up on the following pending results: None  Home Health: Not applicable Equipment/Devices: Not applicable  Discharge Condition: Stable CODE STATUS: Full code  Hospital Course: 78 year old female with history of NSCLC currently in remission, diastolic CHF, hypertension and A. fib on Eliquis presenting with low hemoglobin in the setting of melanotic bowel movements.  Globin 4.7 on admission.  Received 3 units of blood and hemoglobin trended up to 8.1 and settled at 7.5.  Patient had EGD on 03/26/2019 that revealed gastritis.    Duodenal biopsy consistent with peptic injury.  Gastric biopsy consistent with reactive gastropathy.  No metaplasia, dysplasia or malignancy.  H. pylori negative.  Received a total of 4 units of packed red blood cell.  Anemia panel consistent with anemia of chronic disease but obtained after blood transfusion.  Eliquis resumed on 03/29/2019.  Hemoglobin remained stable at 8.2 on the day of discharge.  Patient to continue Protonix twice daily.  Evaluated by PT/OT and SNF placement was recommended.  See individual problem list below for more.  Discharge Diagnoses:  Principal Problem:   Melena Active Problems:   Cancer of right lung parenchyma (HCC)   Chronic diastolic heart failure (HCC)   Persistent atrial fibrillation; CHA2DS2Vacs = 6; Eliquis; Amiodarone for rhythm control   Kidney disease, chronic, stage III (GFR 30-59 ml/min) (HCC)   Hypertensive heart and chronic kidney disease with heart failure and stage 1 through stage 4 chronic kidney disease, or chronic kidney disease (HCC)   COPD (chronic obstructive pulmonary  disease) ([place])   AKI (acute kidney injury) ([place])   Acute blood loss anemia   Gastritis and gastroduodenitis  Acute blood loss anemia due to melanotic GI bleed/gastritis-stable -EGD on 4/29 revealed gastritis-biopsy obtained. -Duodenal biopsy consistent with peptic injury.  Gastric biopsy consistent with reactive gastropathy.  No metaplasia, dysplasia or malignancy.   -H. pylori negative. -Anemia panel consistent with anemia of chronic disease but after blood transfusion. -Hemoglobin 4.7--3 units--8.1--7.5--6.7--1 unit--8.3--8.2 on day of discharge -Eliquis resumed on 03/29/2019 and hemoglobin remained stable the next day. -Continue Protonix 40 mg twice daily per GI recommendation. -Continue oral iron -PT/OT-SNF -Repeat CBC at follow-up -Monitor for signs of GI bleed.  Persistent atrial fibrillation: Rate and rhythm controlled.  Chads 2 vasc score 6. -Resumed home Eliquis on 03/29/2019.  H&H stable. -Continue amiodarone -Resume home Coreg.  Chronic diastolic CHF: Edema improved.  Excellent urine output.  Creatinine improving. -Discharged on home Demadex. -Daily weight, intake output -Repeat BMP and magnesium at follow-up.  AKI on CKD 3: presumably prerenal in the setting of hypotension.  Also on Demadex.  Baseline creatinine 1.6.  Improving. -Continue monitoring -Demadex as above  Essential hypertension: SBP in 160s. -Continue home Coreg, hydralazine and Demadex.  COPD: On 2 L by nasal cannula chronically.  Stable. -Continue home medications-Anoro Ellipta and PRN albuterol  Hypokalemia: Likely due to diuretics as above -Replenished prior to discharge.  Magnesium was normal.  Anxiety:  Somewhat anxious today. -Continue home BuSpar  Morbid obesity: BMI 39.68 -Would benefit from follow-up with nutritionist.  Discharge Instructions  Discharge Instructions    Diet - low sodium heart healthy   Complete by:  As directed  Increase activity slowly   Complete by:  As  directed      Allergies as of 03/30/2019      Reactions   Septra Ds [sulfamethoxazole-trimethoprim] Nausea Only      Medication List    STOP taking these medications   ampicillin 500 MG capsule Commonly known as:  PRINCIPEN   docusate sodium 100 MG capsule Commonly known as:  COLACE     TAKE these medications   acetaminophen 500 MG tablet Commonly known as:  TYLENOL Take 1,000 mg by mouth every 12 (twelve) hours as needed for mild pain.   albuterol 108 (90 Base) MCG/ACT inhaler Commonly known as:  ProAir HFA Inhale 2 puffs into the lungs every 4 (four) hours as needed for wheezing or shortness of breath.   amiodarone 200 MG tablet Commonly known as:  PACERONE Take 1 tablet (200 mg total) by mouth daily.   Anoro Ellipta 62.5-25 MCG/INH Aepb Generic drug:  umeclidinium-vilanterol INHALE 1 PUFF BY MOUTH INTO LUNGS ONCE DAILY What changed:  See the new instructions.   apixaban 5 MG Tabs tablet Commonly known as:  Eliquis Take 1 tablet (5 mg total) by mouth 2 (two) times daily. OK to restart on Friday 05/10/18. What changed:  additional instructions   bisacodyl 10 MG suppository Commonly known as:  DULCOLAX Place 10 mg rectally daily as needed for moderate constipation.   busPIRone 5 MG tablet Commonly known as:  BUSPAR Take 5 mg by mouth 3 (three) times daily.   calcitRIOL 0.5 MCG capsule Commonly known as:  ROCALTROL Take 0.5 mcg by mouth 3 (three) times daily.   carvedilol 12.5 MG tablet Commonly known as:  COREG Take 12.5 mg by mouth 2 (two) times daily with a meal.   ferrous sulfate 325 (65 FE) MG tablet Take 325 mg by mouth daily.   hydrALAZINE 25 MG tablet Commonly known as:  APRESOLINE Take 25 mg by mouth 3 (three) times daily.   hydrocortisone 2.5 % rectal cream Commonly known as:  ANUSOL-HC Place 1 application rectally 3 (three) times daily. At every shift   Melatonin 10 MG Tabs Take 10 mg by mouth daily.   oxyCODONE 5 MG immediate release  tablet Commonly known as:  Oxy IR/ROXICODONE Take 1 tablet (5 mg total) by mouth every 4 (four) hours as needed for moderate pain.   OXYGEN Inhale 2 L into the lungs continuous.   pantoprazole 40 MG tablet Commonly known as:  PROTONIX Take 1 tablet (40 mg total) by mouth 2 (two) times daily.   promethazine 25 MG tablet Commonly known as:  PHENERGAN Take 25 mg by mouth every 6 (six) hours as needed for nausea or vomiting.   senna 8.6 MG Tabs tablet Commonly known as:  SENOKOT Take 2 tablets by mouth every 12 (twelve) hours. Hold if patient has diarrhea   torsemide 20 MG tablet Commonly known as:  DEMADEX Take 1 tablet (20 mg total) by mouth daily.   traZODone 100 MG tablet Commonly known as:  DESYREL Take 100 mg by mouth at bedtime.       Contact information for follow-up providers    [name], MD. Schedule an appointment as soon as possible for a visit in 1 week(s).   Specialty:  Family Medicine Contact information: [place] [address] [phone]        Leonie Man, MD .   Specialty:  Cardiology Contact information: [address] [place] [address] [phone]  Contact information for after-discharge care    Destination    [facility] Preferred SNF .   Service:  Skilled Nursing Contact information: [address] [place] [phone]                  Consultations:  Gastroenterology  Procedures/Studies:  2D Echo: None obtained this admission.  Dg Chest Port 1 View  Result Date: 03/25/2019 CLINICAL DATA:  Bibasilar crackles. Edema. Congestive heart failure. Right lung cancer. EXAM: PORTABLE CHEST 1 VIEW COMPARISON:  Two-view chest x-ray 02/13/2019 FINDINGS: A left subclavian Port-A-Cath is stable. Chronic volume loss is present at the right base. New right basilar airspace disease is present. The left lung is clear. Lung volumes  are low. IMPRESSION: 1. New right basilar airspace disease superimposed on chronic volume loss. Findings are concerning for infection/pneumonia. 2. The left lung is clear. 3. Stable Port-A-Cath. Electronically Signed   By: San Morelle M.D.   On: 03/25/2019 05:29     Subjective: No major events overnight of this morning.  No complaints this morning.  Denies chest pain, dyspnea or abdominal pain.  Had nonbloody BMs yesterday.  Somewhat anxious about going back to rehab today out of concern for rebleeding.    Discharge Exam: Vitals:   03/30/19 0542 03/30/19 0749  BP: (!) 163/66   Pulse: 83   Resp: 18   Temp: 98.1 F (36.7 C)   SpO2: 100% 97%    GENERAL: No acute distress.  Appears well.  HEENT: MMM.  Vision and hearing grossly intact.  NECK: Supple.  No apparent JVD LUNGS:  No IWOB.  Fair air movement bilaterally. HEART:  RRR. Heart sounds normal.  ABD: Bowel sounds present. Soft. Non tender.  MSK/EXT:  Moves all extremities. No apparent deformity.  Trace edema bilaterally SKIN: no apparent skin lesion or wound NEURO: Awake, alert and oriented appropriately.  No gross deficit.  PSYCH: Calm. Normal affect.    The results of significant diagnostics from this hospitalization (including imaging, microbiology, ancillary and laboratory) are listed below for reference.     Microbiology: Recent Results (from the past 240 hour(s))  MRSA PCR Screening     Status: None   Collection Time: 03/25/19  2:38 AM  Result Value Ref Range Status   MRSA by PCR NEGATIVE NEGATIVE Final    Comment:        The GeneXpert MRSA Assay (FDA approved for NASAL specimens only), is one component of a comprehensive MRSA colonization surveillance program. It is not intended to diagnose MRSA infection nor to guide or monitor treatment for MRSA infections. Performed at [facility], [place] [address], [place], [place] [address]   Novel Coronavirus, NAA (hospital order; send-out to ref lab)      Status: None   Collection Time: 03/27/19  3:25 PM  Result Value Ref Range Status   SARS-CoV-2, NAA NOT DETECTED NOT DETECTED Final    Comment: (NOTE) This test was developed and its performance characteristics determined by [employer]. This test has not been FDA cleared or approved. This test has been authorized by FDA under an Emergency Use Authorization (EUA). This test has been validated in accordance with the FDA's Guidance Document (Policy for [place] in Laboratories Certified to Perform High Complexity Testing under CLIA prior to Emergency Use Authorization for Coronavirus [id number] during the [facility] Emergency) issued on February 29th, 2020. FDA independent review of this validation is pending. This test is only authorized for the duration  of time the declaration that circumstances exist justifying the authorization of the emergency use of in vitro diagnostic tests for detection of SARS-CoV- 2 virus and/or diagnosis of COVID-19 infection under section 564(b)(1) of the Act, 21 U.S.C. 034VQQ-5(Z)(5), unless the authorization is terminated or revoked sooner. Performed At: [facility] [address] [place], [place] [id number] Rush Farmer MD IR:[id number]    [place]  Final    Comment: Performed at [facility], [place] [address] [place], [place] [address]     Labs: BNP (last 3 results) Recent Labs    01/28/19 1201  BNP 109.3*   Basic Metabolic Panel: Recent Labs  Lab 03/26/19 0403 03/27/19 0430 03/28/19 0352 03/29/19 0343 03/30/19 0449  NA 139 138 134* 137 138  K 3.3* 4.5 3.7 3.1* 3.5  CL 103 105 100 97* 98  CO2 28 28 28  32 32  GLUCOSE 83 110* 88 101* 91  BUN 48* 39* 38* 34* 27*  CREATININE 2.44* 2.09* 2.00* 2.06* 1.95*  CALCIUM 10.5* 10.3 9.9 10.1 9.6  MG  --   --   --  1.7  --    Liver Function Tests: Recent Labs  Lab 03/24/19 2151  AST 14*  ALT 10  ALKPHOS 26*  BILITOT 0.6   PROT 4.9*  ALBUMIN 2.4*   No results for input(s): LIPASE, AMYLASE in the last 168 hours. No results for input(s): AMMONIA in the last 168 hours. CBC: Recent Labs  Lab 03/24/19 2151 03/25/19 1438 03/26/19 0403 03/27/19 0430 03/28/19 0352 03/28/19 1656 03/29/19 0343 03/30/19 0449  WBC 6.8 7.4 5.8 4.6 5.3  --  7.0  --   NEUTROABS 5.3  --   --   --   --   --   --   --   HGB 4.7* 8.1* 7.5* 7.3* 6.7* 8.7* 8.3* 8.2*  HCT 15.0* 24.5* 22.9* 22.6* 21.3* 26.8* 25.5* 25.8*  MCV 104.9* 93.2 95.8 99.1 100.5*  --  95.5  --   PLT 150 134* 120* 136* 140*  --  153  --    Cardiac Enzymes: No results for input(s): CKTOTAL, CKMB, CKMBINDEX, TROPONINI in the last 168 hours. BNP: Invalid input(s): POCBNP CBG: Recent Labs  Lab 03/25/19 0138  GLUCAP 104*   D-Dimer No results for input(s): DDIMER in the last 72 hours. Hgb A1c No results for input(s): HGBA1C in the last 72 hours. Lipid Profile No results for input(s): CHOL, HDL, LDLCALC, TRIG, CHOLHDL, LDLDIRECT in the last 72 hours. Thyroid function studies No results for input(s): TSH, T4TOTAL, T3FREE, THYROIDAB in the last 72 hours.  Invalid input(s): FREET3 Anemia work up No results for input(s): VITAMINB12, FOLATE, FERRITIN, TIBC, IRON, RETICCTPCT in the last 72 hours. Urinalysis    Component Value Date/Time   COLORURINE YELLOW 03/25/2019 0646   APPEARANCEUR CLEAR 03/25/2019 0646   LABSPEC 1.010 03/25/2019 0646   PHURINE 5.0 03/25/2019 0646   GLUCOSEU NEGATIVE 03/25/2019 0646   HGBUR SMALL (A) 03/25/2019 0646   BILIRUBINUR NEGATIVE 03/25/2019 0646   [place] 03/25/2019 0646   PROTEINUR NEGATIVE 03/25/2019 0646   UROBILINOGEN 1.0 12/05/2009 1303   NITRITE NEGATIVE 03/25/2019 0646   LEUKOCYTESUR NEGATIVE 03/25/2019 0646   Sepsis Labs Invalid input(s): PROCALCITONIN,  WBC,  LACTICIDVEN   Time coordinating discharge: 35 minutes  SIGNED:  Mercy Riding, MD  [facility] 03/30/2019, 10:05 AM Pager  [phone]  If 7PM-7AM, please contact night-coverage [email or web address] Password [email or web address]

## 2019-03-30 NOTE — Progress Notes (Signed)
RN called [facility] and gave report to [place] about pt returning to facility today and hospital stay details.

## 2019-03-30 NOTE — Progress Notes (Signed)
Clinical Social Worker facilitated patient discharge including contacting patient family and facility to confirm patient discharge plans.  Clinical information faxed to facility and family agreeable with plan.  CSW arranged ambulance transport via [facility] to [facility]. RN to call for report prior to discharge([phone] RM:[id number]).  Clinical Social Worker will sign off for now as social work intervention is no longer needed. Please consult [name] again if new need arises.  Mahamad Eual Lindstrom LCSW [phone]

## 2019-03-30 NOTE — Progress Notes (Signed)
Attempted multiple contact and left voicemail for callback in regards to patient discharge back to facility. Awaiting confirmation . Will facilitate discharge once facility confirms.

## 2019-03-30 NOTE — Progress Notes (Signed)
Patient was stable at discharge.IV team deaccessed her port. We reviewed the discharge education. Patient verbalized understanding and had no further questions. Patient left with belongings in hand. [facility] transferring pt back to her facility

## 2019-04-01 ENCOUNTER — Ambulatory Visit: Payer: Medicare Other | Admitting: Cardiology

## 2019-04-24 ENCOUNTER — Telehealth: Payer: Self-pay | Admitting: Gastroenterology

## 2019-04-24 NOTE — Telephone Encounter (Signed)
Tammy from pt's rehab center called to schedule pt for an in-office visit for GI bleed (not FU).  Dr. Silverio Decamp does not have any availability until 05/12/19.  Please call the pt back.

## 2019-04-25 NOTE — Telephone Encounter (Signed)
Patient is a residence of [employer].  I was on hold for over 15 minutes and no body knows why the patient needs OV.  Will await a return call.

## 2019-04-28 ENCOUNTER — Telehealth: Payer: Self-pay

## 2019-04-28 NOTE — Telephone Encounter (Signed)
Left voicemail for patient to call office to change office visit to virtual visit due to covid 19, on June 4 at 9:20am

## 2019-04-29 ENCOUNTER — Telehealth: Payer: Self-pay | Admitting: *Deleted

## 2019-04-29 NOTE — Telephone Encounter (Signed)
Spoke to patient - attempt reassure patient that a virtual visit could take place 05/01/19- it had been set up through facility and office. Patient was adamant she did not want have the appointment now she states nothing has changed and blood pressure is good. She maybe going home Friday.( recuperating broken ankle) . She states she will call the office to reschedule when she does not have so much going on. Please cancel appointment"  RN  Informed patient appointment was cancelled and that [place].   RN CALLED TO INFORMED FACILITY- [facility] ( SCHEDULER FOR TRANSPORT) - NOT AVAILABLE LEFT MESSAGE FOR FACILITY CALL BACK

## 2019-04-29 NOTE — Telephone Encounter (Signed)
CALLED patients home number to talk with patient or spouse , no answer. Reviewing patient's discharge summary from hospital - patient was transferred to [facility] on [address]..   RN called [place]. Spoke the nurse [place] attending to patient today.  Arrangement were made for virtual visit on June 4,2020 with Dr Ellyn Hack.  Per  [facility] nurse - the office will have call @ 15 min prior to visit and  email address will be given to start virtual visit  with the patient. The nurse at the facility will go to the patient 's room to start the visit   During that time  Vitals and medication will be obtain by [facility] clinical staff and  email will be given to Dr Ellyn Hack to start the virtual visit .   RN also talked to Conservation officer, nature at facility to request all labs that were done since patient has been at facilty to be faxed to [phone]. She verbalized understanding.

## 2019-05-01 ENCOUNTER — Telehealth: Payer: Medicare Other | Admitting: Cardiology

## 2019-05-15 ENCOUNTER — Telehealth: Payer: Self-pay | Admitting: Cardiology

## 2019-05-15 NOTE — Telephone Encounter (Signed)
Diane [name] called stating he needs verbal ok to be able to do occupational therapy 2x a week for 3 weeks.

## 2019-05-15 NOTE — Telephone Encounter (Signed)
Left message on secure voicemail - the order for occupational therapy will have to contact primary dr dr Diane [name] Patient has not been seen by cardiology since hospitalization - patient cancelled last appt.

## 2019-06-25 ENCOUNTER — Telehealth: Payer: Self-pay | Admitting: Cardiology

## 2019-06-25 NOTE — Telephone Encounter (Signed)
New message    Per Merry Proud need verbal order for Home Health Physical Therapy. 2 week 2 and 1 week 2 for lower extremity strengthening. Please call.

## 2019-06-25 NOTE — Telephone Encounter (Signed)
Truthfully, I think this order should come from her PCP.  I have not seen her in a long time.  I would not usually be the one ordering home health PT.  However I will give a verbal order, but follow-up order should go to her PCP.  Glenetta Hew, MD

## 2019-06-26 NOTE — Telephone Encounter (Signed)
Spoke to [place]- information given -  Per Merry Proud he has reached out to primary DrElkins to get order -

## 2019-08-28 DEATH — deceased

## 2020-06-17 IMAGING — DX DG CHEST 2V
2 series · 2 of 2 positions shown · non-contrast
Comparison: Radiographs 12/01/2018.  CT 08/19/2018.

CLINICAL DATA: Patient fell last night. Low back pain. History of
congestive heart failure with soft tissue edema.

EXAM:
CHEST - 2 VIEW

[chest lat]
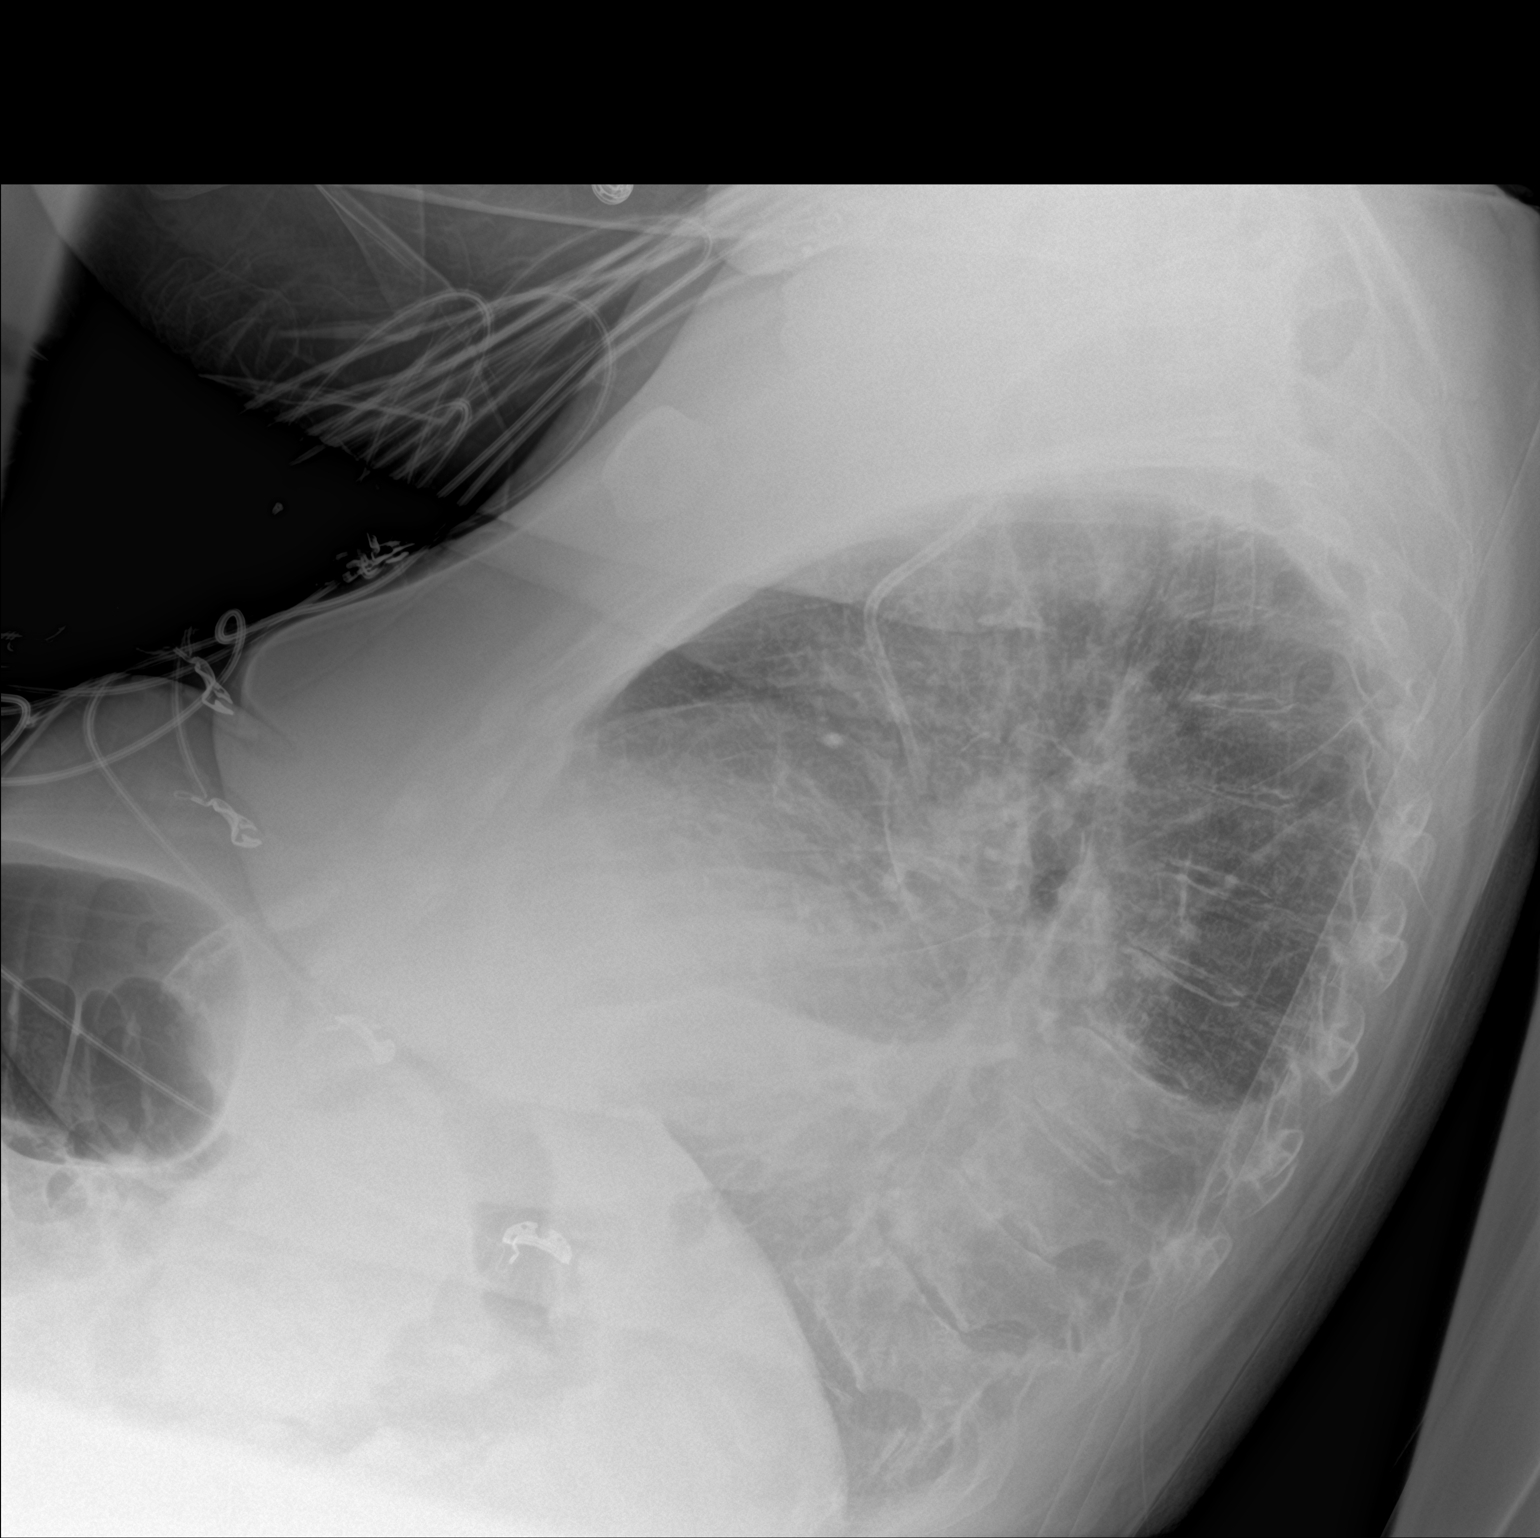

[chest ap]
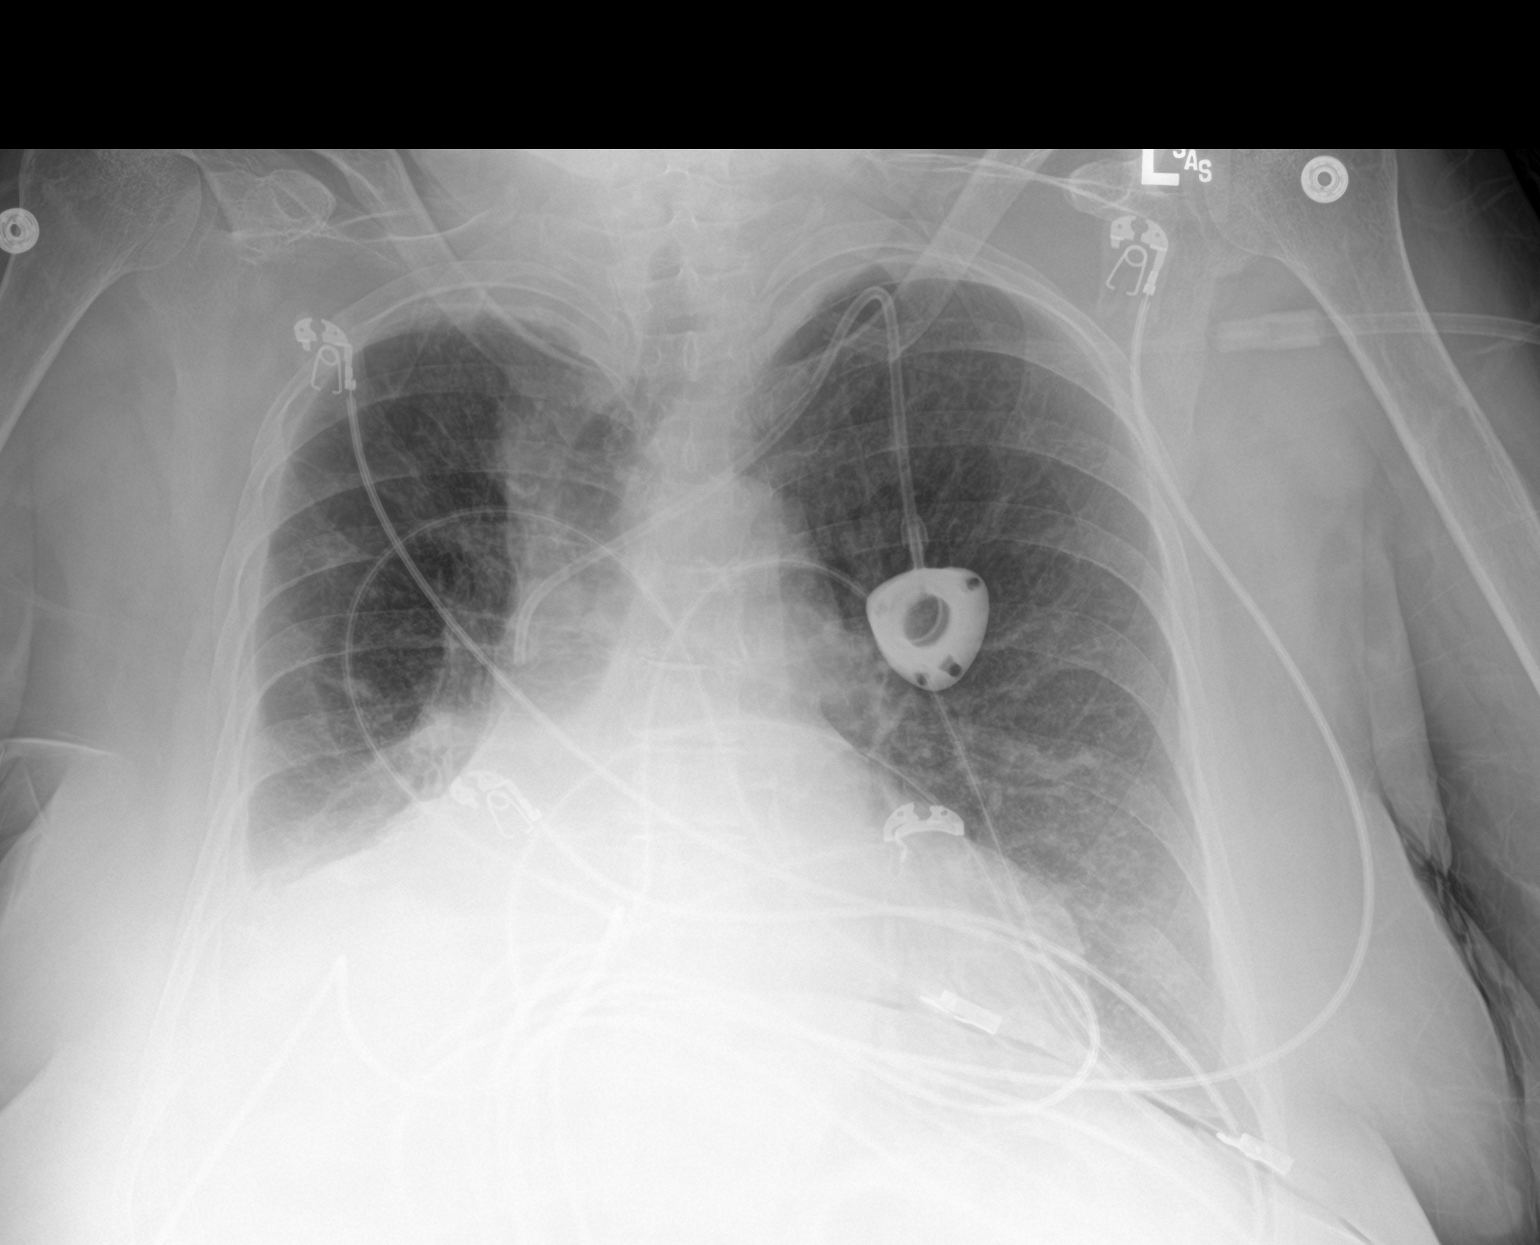

[2 of 2 positions shown; findings below may reference images not displayed]

FINDINGS: Left IJ Port-A-Cath extends to the mid SVC level in appears stable.
The heart size and mediastinal contours are stable with aortic
atherosclerosis. There is chronic volume loss and opacity inferiorly
in the right hemithorax, corresponding with post radiation fibrosis
and fibrothorax on CT. The left lung is clear. There is no new
airspace disease or pneumothorax. No acute osseous findings are
seen.
IMPRESSION: Stable chest with post treatment changes in the right hemithorax
attributed to history of lung cancer. No acute findings.

## 2020-06-17 IMAGING — DX DG TIBIA/FIBULA 2V*L*
4 series · 4 of 4 positions shown · non-contrast
Comparison: Intraoperative radiographs 12/06/2018.

CLINICAL DATA: Patient fell last night with ankle pain and lower
extremity edema. Ankle fracture with ORIF 12/06/2018.

EXAM:
LEFT TIBIA AND FIBULA - 2 VIEW

[tibia ap (1 of 2)]
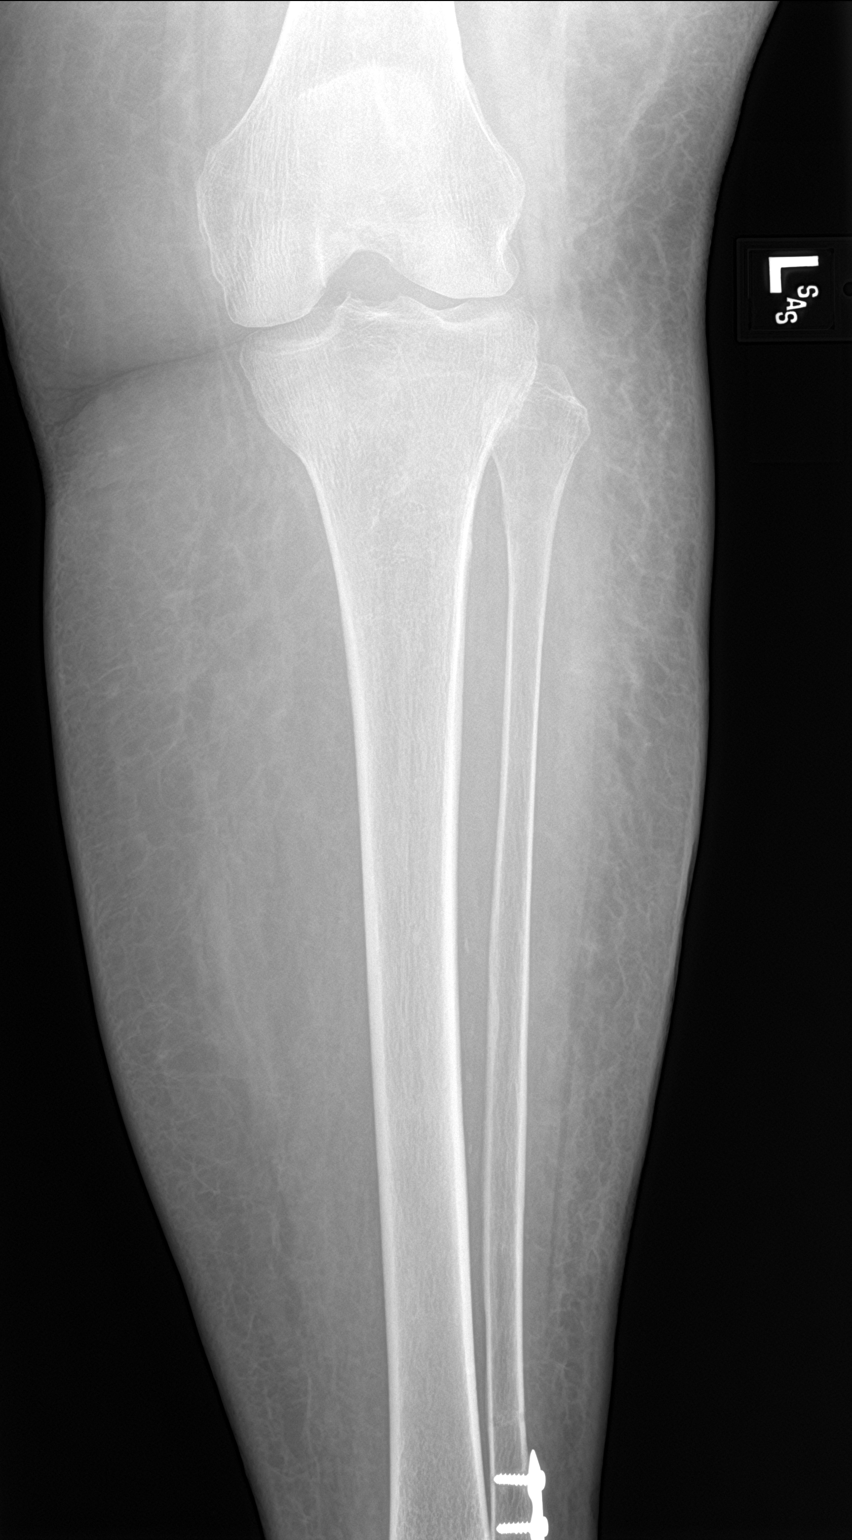

[tibia ap (2 of 2)]
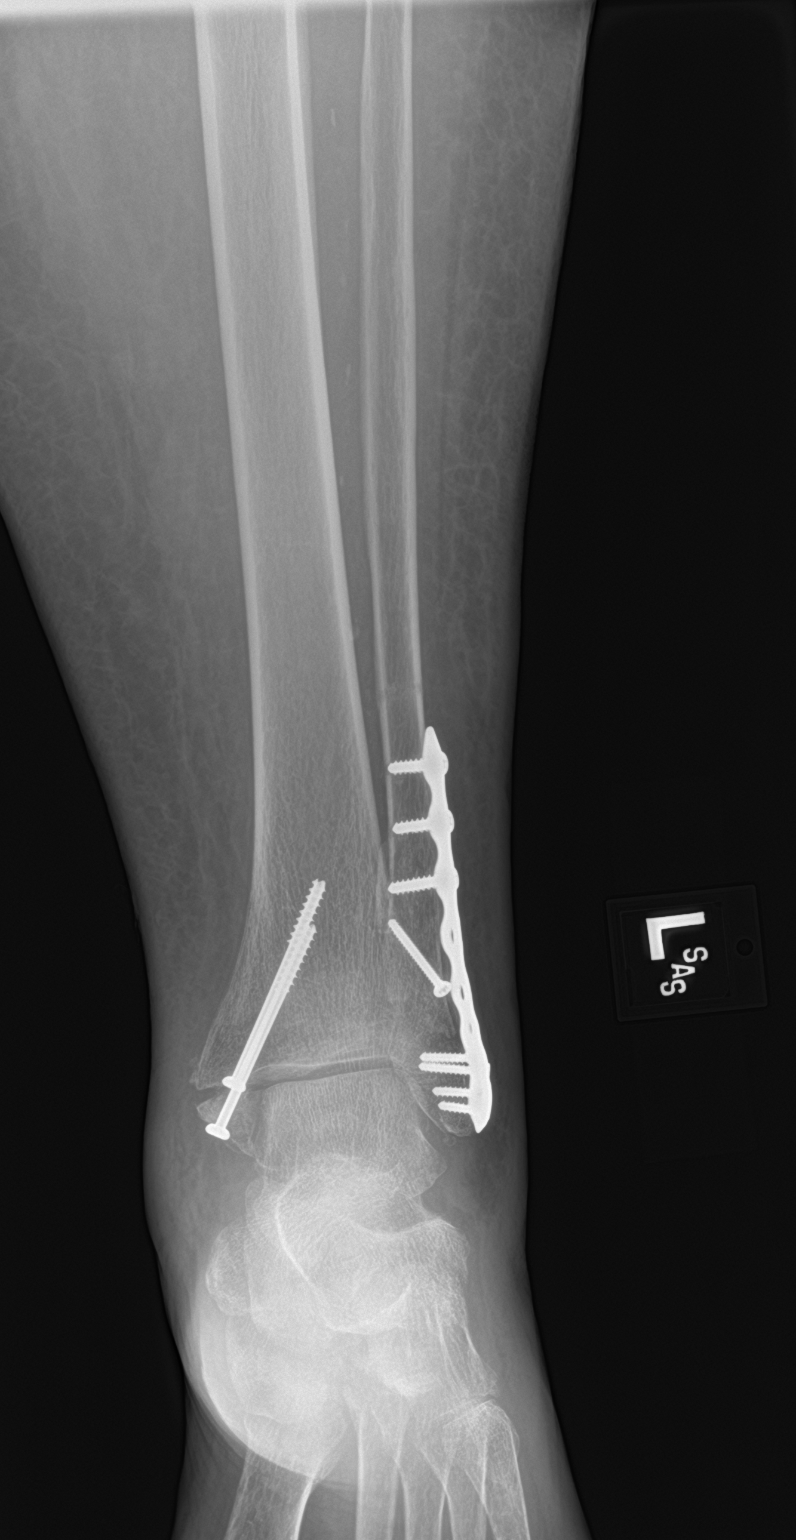

[tibia lat (1 of 2)]
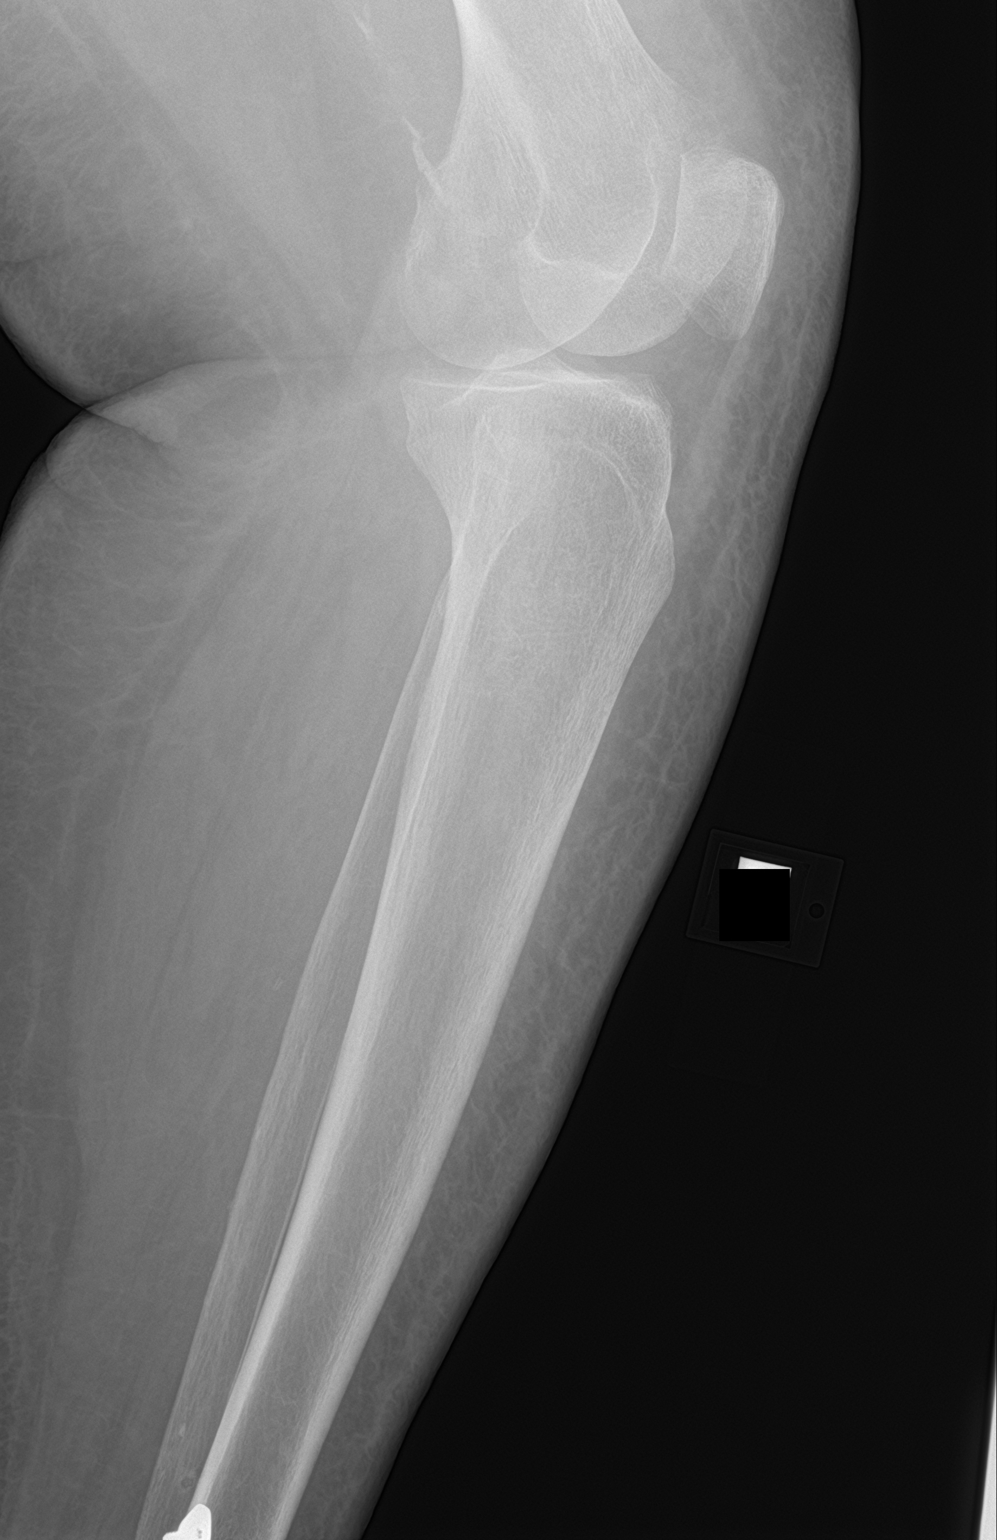

[tibia lat (2 of 2)]
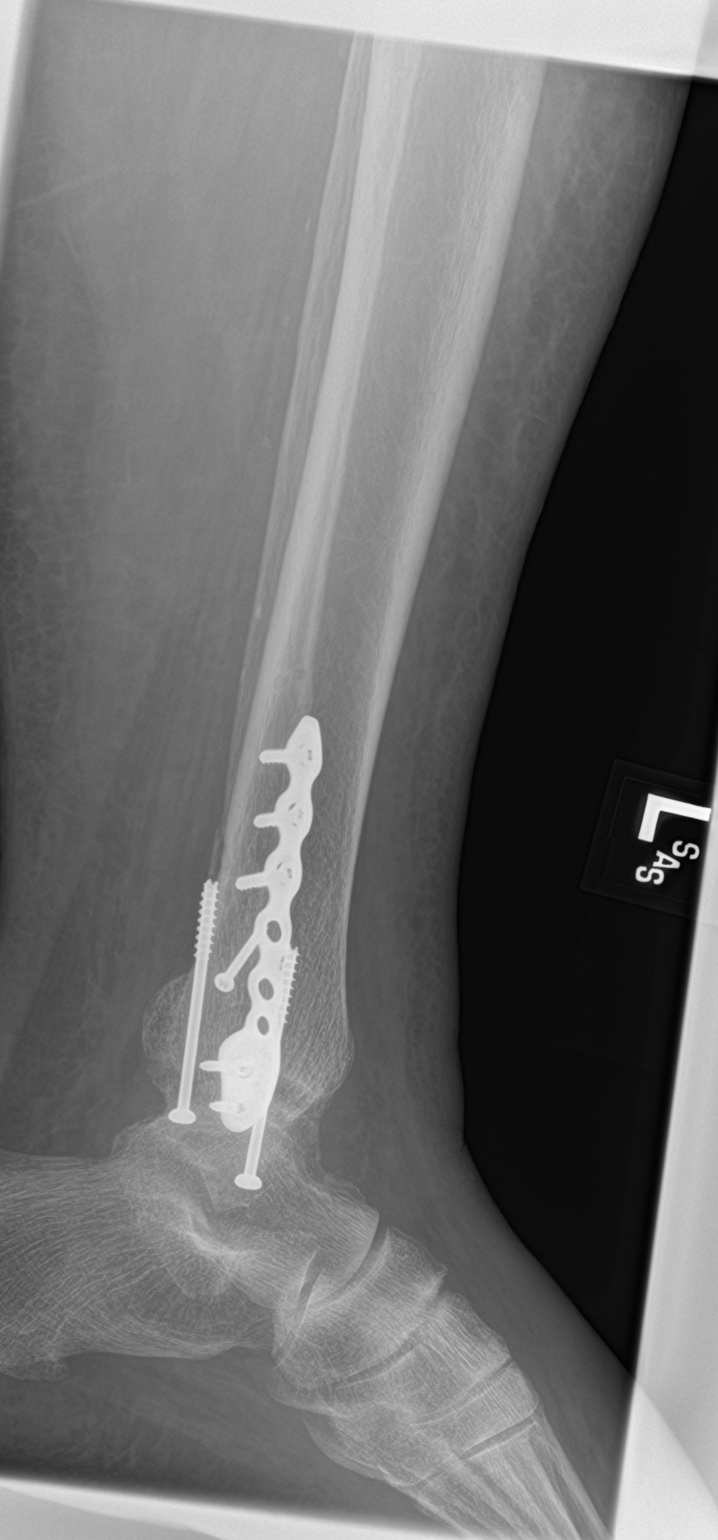

[4 of 4 positions shown; findings below may reference images not displayed]

FINDINGS: The hardware is intact status post distal fibular plate and screw
and medial malleolar screw fixation. No definite healing of the
medial malleolar fracture is demonstrated. This fracture remains
mildly displaced, but unchanged from the intraoperative views. There
is no displacement of the distal fibular fracture. There is no new
fracture or dislocation. There is diffuse lower leg soft tissue
edema.
IMPRESSION: Intact hardware and stable alignment of mildly displaced fracture of
the medial malleolus. No interval healing identified. Nonspecific
lower leg soft tissue edema.
